# Patient Record
Sex: Male | Born: 1935 | Race: Black or African American | Hispanic: No | Smoking: Former smoker
Health system: Southern US, Community
[De-identification: ages and names within clinical notes are randomized; demographics above are authoritative.]

## PROBLEM LIST (undated history)

## (undated) DIAGNOSIS — N185 Chronic kidney disease, stage 5: Secondary | ICD-10-CM

## (undated) DIAGNOSIS — R590 Localized enlarged lymph nodes: Secondary | ICD-10-CM

## (undated) DIAGNOSIS — E785 Hyperlipidemia, unspecified: Secondary | ICD-10-CM

## (undated) DIAGNOSIS — K5790 Diverticulosis of intestine, part unspecified, without perforation or abscess without bleeding: Secondary | ICD-10-CM

## (undated) DIAGNOSIS — I6529 Occlusion and stenosis of unspecified carotid artery: Secondary | ICD-10-CM

## (undated) DIAGNOSIS — I2510 Atherosclerotic heart disease of native coronary artery without angina pectoris: Secondary | ICD-10-CM

## (undated) DIAGNOSIS — K635 Polyp of colon: Secondary | ICD-10-CM

## (undated) DIAGNOSIS — I2721 Secondary pulmonary arterial hypertension: Secondary | ICD-10-CM

## (undated) DIAGNOSIS — D472 Monoclonal gammopathy: Secondary | ICD-10-CM

## (undated) DIAGNOSIS — I10 Essential (primary) hypertension: Secondary | ICD-10-CM

## (undated) DIAGNOSIS — I5032 Chronic diastolic (congestive) heart failure: Secondary | ICD-10-CM

## (undated) DIAGNOSIS — I441 Atrioventricular block, second degree: Secondary | ICD-10-CM

## (undated) DIAGNOSIS — D509 Iron deficiency anemia, unspecified: Secondary | ICD-10-CM

## (undated) DIAGNOSIS — K219 Gastro-esophageal reflux disease without esophagitis: Secondary | ICD-10-CM

## (undated) DIAGNOSIS — I4892 Unspecified atrial flutter: Secondary | ICD-10-CM

## (undated) DIAGNOSIS — N2581 Secondary hyperparathyroidism of renal origin: Secondary | ICD-10-CM

## (undated) HISTORY — DX: Localized enlarged lymph nodes: R59.0

## (undated) HISTORY — DX: Polyp of colon: K63.5

## (undated) HISTORY — DX: Atherosclerotic heart disease of native coronary artery without angina pectoris: I25.10

## (undated) HISTORY — DX: Occlusion and stenosis of unspecified carotid artery: I65.29

## (undated) HISTORY — DX: Chronic kidney disease, stage 5: N18.5

## (undated) HISTORY — DX: Unspecified atrial flutter: I48.92

## (undated) HISTORY — PX: APPENDECTOMY: SHX54

## (undated) HISTORY — DX: Monoclonal gammopathy: D47.2

## (undated) HISTORY — DX: Hyperlipidemia, unspecified: E78.5

## (undated) HISTORY — DX: Diverticulosis of intestine, part unspecified, without perforation or abscess without bleeding: K57.90

## (undated) HISTORY — DX: Chronic diastolic (congestive) heart failure: I50.32

---

## 1994-11-25 DIAGNOSIS — Z951 Presence of aortocoronary bypass graft: Secondary | ICD-10-CM | POA: Insufficient documentation

## 2002-06-24 ENCOUNTER — Encounter: Payer: Self-pay | Admitting: Emergency Medicine

## 2002-06-24 ENCOUNTER — Emergency Department (HOSPITAL_COMMUNITY): Admission: EM | Admit: 2002-06-24 | Discharge: 2002-06-24 | Payer: Self-pay | Admitting: Emergency Medicine

## 2002-07-02 ENCOUNTER — Emergency Department (HOSPITAL_COMMUNITY): Admission: EM | Admit: 2002-07-02 | Discharge: 2002-07-02 | Payer: Self-pay | Admitting: *Deleted

## 2002-08-26 ENCOUNTER — Encounter: Payer: Self-pay | Admitting: Emergency Medicine

## 2002-08-26 ENCOUNTER — Emergency Department (HOSPITAL_COMMUNITY): Admission: EM | Admit: 2002-08-26 | Discharge: 2002-08-26 | Payer: Self-pay | Admitting: Emergency Medicine

## 2002-11-01 ENCOUNTER — Encounter: Admission: RE | Admit: 2002-11-01 | Discharge: 2002-11-01 | Payer: Self-pay | Admitting: Internal Medicine

## 2002-11-01 ENCOUNTER — Encounter: Payer: Self-pay | Admitting: Internal Medicine

## 2002-12-12 ENCOUNTER — Inpatient Hospital Stay (HOSPITAL_COMMUNITY): Admission: EM | Admit: 2002-12-12 | Discharge: 2002-12-14 | Payer: Self-pay | Admitting: Emergency Medicine

## 2002-12-12 ENCOUNTER — Encounter: Payer: Self-pay | Admitting: Pulmonary Disease

## 2004-09-27 HISTORY — PX: CORONARY ARTERY BYPASS GRAFT: SHX141

## 2004-11-17 ENCOUNTER — Ambulatory Visit: Payer: Self-pay | Admitting: Internal Medicine

## 2004-11-18 ENCOUNTER — Encounter: Admission: RE | Admit: 2004-11-18 | Discharge: 2004-11-18 | Payer: Self-pay | Admitting: Internal Medicine

## 2004-12-23 ENCOUNTER — Ambulatory Visit: Payer: Self-pay | Admitting: Internal Medicine

## 2004-12-23 ENCOUNTER — Inpatient Hospital Stay (HOSPITAL_COMMUNITY): Admission: AD | Admit: 2004-12-23 | Discharge: 2005-01-11 | Payer: Self-pay | Admitting: Internal Medicine

## 2004-12-24 ENCOUNTER — Encounter: Payer: Self-pay | Admitting: Internal Medicine

## 2004-12-25 ENCOUNTER — Encounter (INDEPENDENT_AMBULATORY_CARE_PROVIDER_SITE_OTHER): Payer: Self-pay | Admitting: Specialist

## 2004-12-29 ENCOUNTER — Encounter: Payer: Self-pay | Admitting: Cardiothoracic Surgery

## 2004-12-31 ENCOUNTER — Encounter (INDEPENDENT_AMBULATORY_CARE_PROVIDER_SITE_OTHER): Payer: Self-pay | Admitting: Specialist

## 2005-02-02 ENCOUNTER — Ambulatory Visit: Payer: Self-pay | Admitting: Cardiology

## 2005-02-05 ENCOUNTER — Encounter: Admission: RE | Admit: 2005-02-05 | Discharge: 2005-02-05 | Payer: Self-pay | Admitting: Cardiothoracic Surgery

## 2005-02-12 ENCOUNTER — Ambulatory Visit: Payer: Self-pay | Admitting: Cardiology

## 2005-03-05 ENCOUNTER — Ambulatory Visit: Payer: Self-pay | Admitting: Cardiology

## 2005-04-15 ENCOUNTER — Ambulatory Visit: Payer: Self-pay | Admitting: Cardiology

## 2005-04-20 ENCOUNTER — Ambulatory Visit: Payer: Self-pay | Admitting: Internal Medicine

## 2005-04-21 ENCOUNTER — Ambulatory Visit: Payer: Self-pay | Admitting: Internal Medicine

## 2005-04-27 ENCOUNTER — Ambulatory Visit: Payer: Self-pay | Admitting: Internal Medicine

## 2005-09-09 ENCOUNTER — Ambulatory Visit: Payer: Self-pay | Admitting: Internal Medicine

## 2005-10-19 ENCOUNTER — Ambulatory Visit: Payer: Self-pay

## 2005-12-06 ENCOUNTER — Ambulatory Visit: Payer: Self-pay | Admitting: Cardiology

## 2005-12-17 ENCOUNTER — Ambulatory Visit: Payer: Self-pay | Admitting: Cardiology

## 2005-12-17 ENCOUNTER — Ambulatory Visit: Payer: Self-pay

## 2006-05-05 ENCOUNTER — Ambulatory Visit: Payer: Self-pay | Admitting: Cardiology

## 2006-05-13 ENCOUNTER — Ambulatory Visit: Payer: Self-pay | Admitting: Internal Medicine

## 2006-05-23 ENCOUNTER — Ambulatory Visit: Payer: Self-pay | Admitting: Cardiology

## 2006-05-27 ENCOUNTER — Ambulatory Visit: Payer: Self-pay | Admitting: Internal Medicine

## 2006-06-06 ENCOUNTER — Ambulatory Visit: Payer: Self-pay | Admitting: *Deleted

## 2006-08-16 ENCOUNTER — Ambulatory Visit: Payer: Self-pay | Admitting: Internal Medicine

## 2006-08-23 ENCOUNTER — Ambulatory Visit: Payer: Self-pay | Admitting: Cardiology

## 2006-08-24 ENCOUNTER — Ambulatory Visit: Payer: Self-pay | Admitting: Internal Medicine

## 2006-08-25 ENCOUNTER — Ambulatory Visit: Payer: Self-pay | Admitting: Internal Medicine

## 2006-09-05 ENCOUNTER — Ambulatory Visit: Payer: Self-pay

## 2006-09-05 ENCOUNTER — Encounter: Payer: Self-pay | Admitting: Cardiology

## 2006-09-05 ENCOUNTER — Ambulatory Visit: Payer: Self-pay | Admitting: Cardiology

## 2006-10-18 ENCOUNTER — Ambulatory Visit: Payer: Self-pay | Admitting: *Deleted

## 2006-10-21 ENCOUNTER — Encounter: Payer: Self-pay | Admitting: Emergency Medicine

## 2006-10-21 ENCOUNTER — Inpatient Hospital Stay (HOSPITAL_COMMUNITY): Admission: EM | Admit: 2006-10-21 | Discharge: 2006-10-26 | Payer: Self-pay | Admitting: Cardiology

## 2006-10-21 ENCOUNTER — Ambulatory Visit: Payer: Self-pay | Admitting: Cardiology

## 2006-10-31 ENCOUNTER — Ambulatory Visit: Payer: Self-pay

## 2006-10-31 LAB — CONVERTED CEMR LAB
BUN: 24 mg/dL — ABNORMAL HIGH (ref 6–23)
Creatinine, Ser: 1.3 mg/dL (ref 0.4–1.5)
GFR calc Af Amer: 70 mL/min
GFR calc non Af Amer: 58 mL/min
Glucose, Bld: 192 mg/dL — ABNORMAL HIGH (ref 70–99)

## 2006-11-04 ENCOUNTER — Ambulatory Visit: Payer: Self-pay | Admitting: Cardiology

## 2006-11-08 ENCOUNTER — Ambulatory Visit: Payer: Self-pay | Admitting: Internal Medicine

## 2006-11-08 LAB — CONVERTED CEMR LAB
Hgb A1c MFr Bld: 8.1 % — ABNORMAL HIGH (ref 4.6–6.0)
Iron: 46 ug/dL (ref 42–165)
Saturation Ratios: 15 % — ABNORMAL LOW (ref 20.0–50.0)

## 2006-11-15 ENCOUNTER — Ambulatory Visit: Payer: Self-pay | Admitting: Internal Medicine

## 2006-11-17 ENCOUNTER — Ambulatory Visit: Payer: Self-pay

## 2006-11-17 LAB — CONVERTED CEMR LAB
ALT: 102 units/L — ABNORMAL HIGH (ref 0–40)
AST: 67 units/L — ABNORMAL HIGH (ref 0–37)
Albumin: 3.5 g/dL (ref 3.5–5.2)
Alkaline Phosphatase: 130 units/L — ABNORMAL HIGH (ref 39–117)
Cholesterol: 74 mg/dL (ref 0–200)
HDL: 32.3 mg/dL — ABNORMAL LOW (ref >39.0)
LDL Cholesterol: 33 mg/dL (ref 0–99)
Triglycerides: 44 mg/dL (ref 0–149)
VLDL: 9 mg/dL (ref 0–40)

## 2007-05-13 ENCOUNTER — Encounter: Payer: Self-pay | Admitting: *Deleted

## 2007-05-13 DIAGNOSIS — I10 Essential (primary) hypertension: Secondary | ICD-10-CM | POA: Insufficient documentation

## 2007-06-24 DIAGNOSIS — M1990 Unspecified osteoarthritis, unspecified site: Secondary | ICD-10-CM | POA: Insufficient documentation

## 2007-06-24 DIAGNOSIS — E118 Type 2 diabetes mellitus with unspecified complications: Secondary | ICD-10-CM | POA: Insufficient documentation

## 2007-06-24 DIAGNOSIS — Z8679 Personal history of other diseases of the circulatory system: Secondary | ICD-10-CM | POA: Insufficient documentation

## 2007-06-24 DIAGNOSIS — S98139A Complete traumatic amputation of one unspecified lesser toe, initial encounter: Secondary | ICD-10-CM

## 2007-06-24 DIAGNOSIS — Z9089 Acquired absence of other organs: Secondary | ICD-10-CM

## 2007-06-24 DIAGNOSIS — I2510 Atherosclerotic heart disease of native coronary artery without angina pectoris: Secondary | ICD-10-CM | POA: Insufficient documentation

## 2007-06-24 DIAGNOSIS — E785 Hyperlipidemia, unspecified: Secondary | ICD-10-CM

## 2007-10-23 ENCOUNTER — Ambulatory Visit: Payer: Self-pay | Admitting: Cardiology

## 2007-10-23 ENCOUNTER — Ambulatory Visit: Payer: Self-pay | Admitting: Internal Medicine

## 2007-10-23 ENCOUNTER — Ambulatory Visit: Payer: Self-pay

## 2007-10-23 DIAGNOSIS — R1011 Right upper quadrant pain: Secondary | ICD-10-CM

## 2007-10-23 LAB — CONVERTED CEMR LAB
ALT: 22 units/L (ref 0–53)
AST: 31 units/L (ref 0–37)
Albumin: 3.4 g/dL — ABNORMAL LOW (ref 3.5–5.2)
Alkaline Phosphatase: 96 units/L (ref 39–117)
Calcium: 9.3 mg/dL (ref 8.4–10.5)
Chloride: 102 meq/L (ref 96–112)
Creatinine, Ser: 1.3 mg/dL (ref 0.4–1.5)
GFR calc non Af Amer: 57 mL/min
HDL: 37.1 mg/dL — ABNORMAL LOW (ref >39.0)
LDL Cholesterol: 71 mg/dL (ref 0–99)
Sodium: 137 meq/L (ref 135–145)
Total Bilirubin: 0.6 mg/dL (ref 0.3–1.2)
Total Protein: 6.9 g/dL (ref 6.0–8.3)
VLDL: 14 mg/dL (ref 0–40)

## 2007-10-24 LAB — CONVERTED CEMR LAB
ALT: 22 units/L (ref 0–53)
AST: 31 units/L (ref 0–37)
Alkaline Phosphatase: 96 units/L (ref 39–117)
Amylase: 172 units/L — ABNORMAL HIGH (ref 27–131)
BUN: 14 mg/dL (ref 6–23)
Bilirubin, Direct: 0.1 mg/dL (ref 0.0–0.3)
CO2: 27 meq/L (ref 19–32)
Calcium: 9.3 mg/dL (ref 8.4–10.5)
Creatinine, Ser: 1.3 mg/dL (ref 0.4–1.5)
GFR calc Af Amer: 69 mL/min
Lipase: 46 units/L (ref 11.0–59.0)
Potassium: 3.8 meq/L (ref 3.5–5.1)
Sodium: 137 meq/L (ref 135–145)
Triglycerides: 70 mg/dL (ref 0–149)
VLDL: 14 mg/dL (ref 0–40)

## 2007-10-26 ENCOUNTER — Encounter: Payer: Self-pay | Admitting: Internal Medicine

## 2007-10-27 ENCOUNTER — Telehealth: Payer: Self-pay | Admitting: Internal Medicine

## 2008-01-29 ENCOUNTER — Ambulatory Visit: Payer: Self-pay | Admitting: Internal Medicine

## 2008-01-31 ENCOUNTER — Telehealth: Payer: Self-pay | Admitting: Internal Medicine

## 2008-02-07 ENCOUNTER — Ambulatory Visit: Payer: Self-pay | Admitting: Internal Medicine

## 2008-02-07 DIAGNOSIS — IMO0002 Reserved for concepts with insufficient information to code with codable children: Secondary | ICD-10-CM | POA: Insufficient documentation

## 2008-07-01 ENCOUNTER — Ambulatory Visit: Payer: Self-pay | Admitting: Cardiology

## 2008-07-23 ENCOUNTER — Telehealth: Payer: Self-pay | Admitting: Internal Medicine

## 2008-07-24 ENCOUNTER — Ambulatory Visit: Payer: Self-pay | Admitting: Internal Medicine

## 2008-07-24 DIAGNOSIS — E1149 Type 2 diabetes mellitus with other diabetic neurological complication: Secondary | ICD-10-CM

## 2008-07-24 LAB — CONVERTED CEMR LAB
ALT: 19 units/L (ref 0–53)
Albumin: 3.6 g/dL (ref 3.5–5.2)
Alkaline Phosphatase: 86 units/L (ref 39–117)
Basophils Absolute: 0 10*3/uL (ref 0.0–0.1)
Bilirubin, Direct: 0.1 mg/dL (ref 0.0–0.3)
CO2: 27 meq/L (ref 19–32)
Calcium: 9.4 mg/dL (ref 8.4–10.5)
Chloride: 107 meq/L (ref 96–112)
Cholesterol: 136 mg/dL (ref 0–200)
Creatinine, Ser: 1.4 mg/dL (ref 0.4–1.5)
Eosinophils Relative: 1 % (ref 0.0–5.0)
Glucose, Bld: 108 mg/dL — ABNORMAL HIGH (ref 70–99)
HDL: 40.5 mg/dL (ref >39.0)
Hgb A1c MFr Bld: 7.1 % — ABNORMAL HIGH (ref 4.6–6.0)
LDL Cholesterol: 83 mg/dL (ref 0–99)
MCHC: 33.9 g/dL (ref 30.0–36.0)
Neutro Abs: 3.3 10*3/uL (ref 1.4–7.7)
Platelets: 187 10*3/uL (ref 150–400)
RBC: 3.89 M/uL — ABNORMAL LOW (ref 4.22–5.81)
RDW: 13.9 % (ref 11.5–14.6)
Sodium: 140 meq/L (ref 135–145)
Total Protein: 7.3 g/dL (ref 6.0–8.3)

## 2008-07-25 ENCOUNTER — Encounter: Payer: Self-pay | Admitting: Internal Medicine

## 2008-12-02 ENCOUNTER — Ambulatory Visit: Payer: Self-pay | Admitting: Cardiology

## 2009-01-17 ENCOUNTER — Ambulatory Visit: Payer: Self-pay | Admitting: Cardiology

## 2009-01-17 ENCOUNTER — Ambulatory Visit: Payer: Self-pay

## 2009-01-17 LAB — CONVERTED CEMR LAB
ALT: 18 U/L (ref 0–53)
AST: 26 U/L (ref 0–37)
Albumin: 3.7 g/dL (ref 3.5–5.2)
Alkaline Phosphatase: 100 U/L (ref 39–117)
BUN: 17 mg/dL (ref 6–23)
Bilirubin, Direct: 0.1 mg/dL (ref 0.0–0.3)
CO2: 26 meq/L (ref 19–32)
Calcium: 9.4 mg/dL (ref 8.4–10.5)
Chloride: 111 meq/L (ref 96–112)
Cholesterol: 108 mg/dL (ref 0–200)
Creatinine, Ser: 1.7 mg/dL — ABNORMAL HIGH (ref 0.4–1.5)
GFR calc non Af Amer: 50.62 mL/min (ref >60)
Glucose, Bld: 159 mg/dL — ABNORMAL HIGH (ref 70–99)
HDL: 37.8 mg/dL — ABNORMAL LOW (ref >39.00)
LDL Cholesterol: 60 mg/dL (ref 0–99)
Potassium: 4.1 meq/L (ref 3.5–5.1)
Sodium: 141 meq/L (ref 135–145)
Total Bilirubin: 0.5 mg/dL (ref 0.3–1.2)
Total CHOL/HDL Ratio: 3
Total Protein: 7.5 g/dL (ref 6.0–8.3)
Triglycerides: 52 mg/dL (ref 0.0–149.0)
VLDL: 10.4 mg/dL (ref 0.0–40.0)

## 2009-01-23 ENCOUNTER — Ambulatory Visit: Payer: Self-pay | Admitting: Internal Medicine

## 2009-01-23 ENCOUNTER — Telehealth: Payer: Self-pay | Admitting: Cardiology

## 2009-01-23 DIAGNOSIS — R519 Headache, unspecified: Secondary | ICD-10-CM | POA: Insufficient documentation

## 2009-01-23 DIAGNOSIS — R51 Headache: Secondary | ICD-10-CM

## 2009-08-25 ENCOUNTER — Ambulatory Visit: Payer: Self-pay | Admitting: Cardiology

## 2010-01-22 ENCOUNTER — Encounter: Payer: Self-pay | Admitting: Cardiology

## 2010-01-23 ENCOUNTER — Ambulatory Visit: Payer: Self-pay

## 2010-01-23 ENCOUNTER — Encounter: Payer: Self-pay | Admitting: Cardiology

## 2010-03-25 ENCOUNTER — Encounter (INDEPENDENT_AMBULATORY_CARE_PROVIDER_SITE_OTHER): Payer: Self-pay | Admitting: *Deleted

## 2010-04-23 ENCOUNTER — Ambulatory Visit: Payer: Self-pay | Admitting: Cardiology

## 2010-04-28 ENCOUNTER — Telehealth (INDEPENDENT_AMBULATORY_CARE_PROVIDER_SITE_OTHER): Payer: Self-pay | Admitting: *Deleted

## 2010-04-29 ENCOUNTER — Ambulatory Visit: Payer: Self-pay

## 2010-04-29 ENCOUNTER — Encounter (HOSPITAL_COMMUNITY): Admission: RE | Admit: 2010-04-29 | Discharge: 2010-06-22 | Payer: Self-pay | Admitting: Cardiology

## 2010-04-29 ENCOUNTER — Encounter: Payer: Self-pay | Admitting: Cardiovascular Disease

## 2010-04-29 ENCOUNTER — Ambulatory Visit: Payer: Self-pay | Admitting: Cardiovascular Disease

## 2010-07-28 ENCOUNTER — Telehealth: Payer: Self-pay | Admitting: Internal Medicine

## 2010-08-10 ENCOUNTER — Ambulatory Visit: Payer: Self-pay | Admitting: Internal Medicine

## 2010-08-14 ENCOUNTER — Ambulatory Visit: Payer: Self-pay | Admitting: Internal Medicine

## 2010-08-18 ENCOUNTER — Ambulatory Visit: Payer: Self-pay | Admitting: Internal Medicine

## 2010-08-24 ENCOUNTER — Ambulatory Visit: Payer: Self-pay | Admitting: Internal Medicine

## 2010-08-26 ENCOUNTER — Telehealth: Payer: Self-pay | Admitting: Internal Medicine

## 2010-08-28 ENCOUNTER — Encounter: Payer: Self-pay | Admitting: Internal Medicine

## 2010-09-07 ENCOUNTER — Ambulatory Visit: Payer: Self-pay | Admitting: Internal Medicine

## 2010-09-22 ENCOUNTER — Ambulatory Visit: Payer: Self-pay | Admitting: Internal Medicine

## 2010-10-09 ENCOUNTER — Encounter: Payer: Self-pay | Admitting: Internal Medicine

## 2010-10-18 ENCOUNTER — Encounter: Payer: Self-pay | Admitting: Cardiology

## 2010-10-22 ENCOUNTER — Ambulatory Visit
Admission: RE | Admit: 2010-10-22 | Discharge: 2010-10-22 | Payer: Self-pay | Source: Home / Self Care | Attending: Internal Medicine | Admitting: Internal Medicine

## 2010-10-22 ENCOUNTER — Other Ambulatory Visit: Payer: Self-pay | Admitting: Internal Medicine

## 2010-10-22 DIAGNOSIS — I2589 Other forms of chronic ischemic heart disease: Secondary | ICD-10-CM | POA: Insufficient documentation

## 2010-10-22 LAB — HEPATIC FUNCTION PANEL
ALT: 47 U/L (ref 0–53)
AST: 47 U/L — ABNORMAL HIGH (ref 0–37)
Alkaline Phosphatase: 100 U/L (ref 39–117)
Bilirubin, Direct: 0.1 mg/dL (ref 0.0–0.3)

## 2010-10-22 LAB — CONVERTED CEMR LAB: Blood Glucose, Fingerstick: 237

## 2010-10-22 LAB — HEMOGLOBIN A1C: Hgb A1c MFr Bld: 10 % — ABNORMAL HIGH (ref 4.6–6.5)

## 2010-10-22 LAB — LIPID PANEL
Cholesterol: 88 mg/dL (ref 0–200)
VLDL: 9.6 mg/dL (ref 0.0–40.0)

## 2010-10-23 ENCOUNTER — Encounter (INDEPENDENT_AMBULATORY_CARE_PROVIDER_SITE_OTHER): Payer: Self-pay | Admitting: *Deleted

## 2010-10-23 ENCOUNTER — Telehealth: Payer: Self-pay | Admitting: Internal Medicine

## 2010-10-25 LAB — CONVERTED CEMR LAB
ALT: 28 units/L (ref 0–53)
AST: 27 units/L (ref 0–37)
Albumin: 3.7 g/dL (ref 3.5–5.2)
Alkaline Phosphatase: 118 units/L — ABNORMAL HIGH (ref 39–117)
CO2: 25 meq/L (ref 19–32)
Calcium: 8.8 mg/dL (ref 8.4–10.5)
Creatinine, Ser: 2 mg/dL — ABNORMAL HIGH (ref 0.4–1.5)
GFR calc non Af Amer: 42.81 mL/min (ref >60)
Total Bilirubin: 0.6 mg/dL (ref 0.3–1.2)
Total CHOL/HDL Ratio: 3
Triglycerides: 81 mg/dL (ref 0.0–149.0)

## 2010-10-27 NOTE — Letter (Signed)
Summary: Appointment - Missed  [facility]     [place], [place]    Phone:   Fax:      March 25, 2010 MRN: [id number]   [facility] [address] [place], [place]  [address]   Dear [name],  Our records indicate you missed your appointment on Juen 27,2011  with  Dr. [name] It is very important that we reach you to reschedule this appointment. We look forward to participating in your health care needs. Please contact us at the number listed above at your earliest convenience to reschedule this appointment.     Sincerely,     Lorne Skeens  [facility] Scheduling Team

## 2010-10-27 NOTE — Progress Notes (Signed)
  Phone Note Other Incoming   Caller: Dr Burgess Estelle (opthamologist) Summary of Call: 1.received a call from Dr Burgess Estelle, He states the pt has active Diabetic Retinopathy. Pt also has cateracts and if is impairing his vision. Dr Burgess Estelle states that the pt need to have surg on his cateracts but the problem is that the pt's diabetes is not under control. Pt hasn't taken his metformin in about 5 months because he was taken off of it by Dr Leo Grosser back in July. I am going to call pt to set up appt to come in the office here to see you. Do you want pt to have labs prior to his visit. Please Advise. 2. After Pt comes in for OV Dr Burgess Estelle wants his office notes faxed to his office at [phone] Initial call taken by: Ami Bullins CMA,  July 28, 2010 1:58 PM  Follow-up for Phone Call        no need for lab if he hasn't taken meds for 5 months. Follow-up by: Jacques Navy MD,  July 28, 2010 2:49 PM  Additional Follow-up for Phone Call Additional follow up Details #1::        [facility] Additional Follow-up by: Ami Bullins CMA,  July 28, 2010 3:32 PM    Additional Follow-up for Phone Call Additional follow up Details #2::    spoke with pt and made appt with Dr Debby Bud for Monday Nov 14,2011 Follow-up by: Ami Bullins CMA,  July 29, 2010 1:55 PM

## 2010-10-27 NOTE — Progress Notes (Signed)
Summary: Nuclear Pre-Procedure  Phone Note Outgoing Call   Call placed by: Milana Na, EMT-P,  April 28, 2010 3:16 PM Summary of Call: Left message with information on Myoview Information Sheet (see scanned document for details).      Nuclear Med Background Indications for Stress Test: Evaluation for Ischemia   History: Abnormal EKG, CABG, Echo, Heart Catheterization, Myocardial Perfusion Study  History Comments: '06 Heart Cath EF 45-50% ---CABG x3 '07 ECHO EF 60% '08 MPS EF 46% prior inferior/apical scar (-) ischemia  Symptoms: Chest Pain    Nuclear Pre-Procedure Cardiac Risk Factors: Carotid Disease, Family History - CAD, History of Smoking, Hypertension, Lipids, NIDDM Height (in): 71  Nuclear Med Study Referring MD:  B.Crenshaw

## 2010-10-27 NOTE — Assessment & Plan Note (Signed)
Summary: Cardiology Nuclear Testing  Nuclear Med Background Indications for Stress Test: Evaluation for Ischemia   History: Abnormal EKG, CABG, Echo, Heart Catheterization, Myocardial Perfusion Study  History Comments: '06 Heart Cath EF 45-50% ---CABG x3 '07 ECHO EF 60% '08 MPS EF 46% prior inferior/apical scar (-) ischemia  Symptoms: Chest Pain    Nuclear Pre-Procedure Cardiac Risk Factors: Carotid Disease, Family History - CAD, History of Smoking, Hypertension, Lipids, NIDDM Caffeine/Decaff Intake: None NPO After: 10:30 PM Lungs: clear IV 0.9% NS with Angio Cath: 22g     IV Site: (R) Wrist IV Started by: Irean Hong RN Chest Size (in) 44     Height (in): 71 Weight (lb): 188 BMI: 26.32  Nuclear Med Study 1 or 2 day study:  1 day     Stress Test Type:  Eugenie Birks Reading MD:  Charlton Haws, MD     Referring MD:  B.Crenshaw Resting Radionuclide:  Technetium 78m Tetrofosmin     Resting Radionuclide Dose:  10.6 mCi  Stress Radionuclide:  Technetium 69m Tetrofosmin     Stress Radionuclide Dose:  33.0 mCi   Stress Protocol   Lexiscan: 0.4 mg   Stress Test Technologist:  Milana Na EMT-P     Nuclear Technologist:  Domenic Polite CNMT  Rest Procedure  Myocardial perfusion imaging was performed at rest 45 minutes following the intravenous administration of Myoview Technetium 60m Tetrofosmin.  Stress Procedure  The patient received IV Lexiscan 0.4 mg over 15-seconds.  Myoview injected at 30-seconds.  There were no significant changes with infusion.  Quantitative spect images were obtained after a 45 minute delay.  QPS Raw Data Images:  Normal; no motion artifact; normal heart/lung ratio. Stress Images:  inferior infarct Rest Images:  inferior infarct Subtraction (SDS):  SDS 1 Transient Ischemic Dilatation:  .93  (Normal <1.22)  Lung/Heart Ratio:  .36  (Normal <0.45)  Quantitative Gated Spect Images QGS EDV:  136 ml QGS ESV:  70 ml QGS EF:  49 % QGS cine images:   Apical dyskinesis  Findings Low risk nuclear study  Evidence for inferior infarct  Evidence for LV Dysfunction LV Dysfunction    Overall Impression  Exercise Capacity: Lexiscan BP Response: Normal blood pressure response. 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

## 2010-10-27 NOTE — Miscellaneous (Signed)
Summary: Orders Update  Clinical Lists Changes  Orders: Added new Test order of Carotid Duplex (Carotid Duplex) - Signed 

## 2010-10-27 NOTE — Assessment & Plan Note (Signed)
Summary: f/u appt/#/cd   Vital Signs:  [facility] profile:   77 year old male Height:      71 inches Weight:      194 pounds BMI:     27.16 O2 Sat:      98 % on Room air Temp:     98.8 degrees F oral Pulse rate:   75 / minute BP sitting:   136 / 68  (left arm) Cuff size:   large  Vitals Entered By: Bill Salinas CMA (August 24, 2010 9:25 AM)  O2 Flow:  Room air  Primary Care Provider:  Trejon Duford   History of Present Illness: presents for follow up of diabetes and levemir use  Current Medications (verified): 1)  Cozaar 50 Mg  Tabs (Losartan Potassium) .... Take One Tab By Mouth Once Daily 2)  Furosemide 20 Mg Tabs (Furosemide) .... Take One Tablet By Mouth Daily. 3)  Zetia 10 Mg  Tabs (Ezetimibe) .... Take One Tab By Mouth Mouth Once Daily 4)  Folic Acid 1 Mg  Tabs (Folic Acid) .... Take One Tablet Once Daily 5)  Adult Aspirin Ec Low Strength 81 Mg  Tbec (Aspirin) .... Take 1 Tablet By Mouth Once A Day 6)  Toprol Xl 25 Mg Xr24h-Tab (Metoprolol Succinate) .... One By Mouth Qd 7)  Simvastatin 40 Mg Tabs (Simvastatin) .... Take One Tablet By Mouth Daily At Bedtime 8)  Gabapentin 100 Mg Caps (Gabapentin) .Marland Kitchen.. 1 By Mouth At Bedtime For  Burning Legs 9)  Levemir Flexpen 100 Unit/ml Soln (Insulin Detemir) .... 25 Units At Bedtime 10)  Dexilant 60 Mg Cpdr (Dexlansoprazole) .Marland Kitchen.. 1 By Mouth Qam  Allergies (verified): 1)  ! * Bee Stings   Impression & Recommendations:  Problem # 1:  DIABETES MELLITUS (ICD-250.00)  CBGs generally in the 200 range. Today 141. He seems to understand use of pen and meter  Plan - increase levemir to 30units at bedtime. Add'l  pen given          continue to monitor CBG q AM  Rx given          eat 3 meals a day and bedtime snack  His updated medication list for this problem includes:    Cozaar 50 Mg Tabs (Losartan potassium) .Marland Kitchen... Take one tab by mouth once daily    Adult Aspirin Ec Low Strength 81 Mg Tbec (Aspirin) .Marland Kitchen... Take 1 tablet by mouth once a  day    Levemir Flexpen 100 Unit/ml Soln (Insulin detemir) .Marland Kitchen... 25 units at bedtime  Orders: No Charge [facility] Arrived (NCPA0) (NCPA0)  Problem # 2:  DIABETIC PERIPHERAL NEUROPATHY (ICD-250.60) gabapentin is helping  Plan - congtinue med  His updated medication list for this problem includes:    Cozaar 50 Mg Tabs (Losartan potassium) .Marland Kitchen... Take one tab by mouth once daily    Adult Aspirin Ec Low Strength 81 Mg Tbec (Aspirin) .Marland Kitchen... Take 1 tablet by mouth once a day    Levemir Flexpen 100 Unit/ml Soln (Insulin detemir) .Marland Kitchen... 25 units at bedtime  Problem # 3:  ABDOMINAL PAIN, RIGHT UPPER QUADRANT (ICD-789.01) Parker never started dexilant  Plan - trial of dexilant.   Complete Medication List: 1)  Cozaar 50 Mg Tabs (Losartan potassium) .... Take one tab by mouth once daily 2)  Furosemide 20 Mg Tabs (Furosemide) .... Take one tablet by mouth daily. 3)  Zetia 10 Mg Tabs (Ezetimibe) .... Take one tab by mouth mouth once daily 4)  Folic Acid 1 Mg Tabs (Folic  acid) .... Take one tablet once daily 5)  Adult Aspirin Ec Low Strength 81 Mg Tbec (Aspirin) .... Take 1 tablet by mouth once a day 6)  Toprol Xl 25 Mg Xr24h-tab (Metoprolol succinate) .... One by mouth qd 7)  Simvastatin 40 Mg Tabs (Simvastatin) .... Take one tablet by mouth daily at bedtime 8)  Gabapentin 100 Mg Caps (Gabapentin) .Marland Kitchen.. 1 by mouth at bedtime for  burning legs 9)  Levemir Flexpen 100 Unit/ml Soln (Insulin detemir) .... 25 units at bedtime 10)  Dexilant 60 Mg Cpdr (Dexlansoprazole) .Marland Kitchen.. 1 by mouth qam   Orders Added: 1)  No Charge [facility] Arrived (NCPA0) [NCPA0]    Laboratory Results   Blood Tests    Date/Time Reported: Ami Bullins CMA  August 24, 2010 9:26 AM   CBG Fasting:: 141mg /dL

## 2010-10-27 NOTE — Assessment & Plan Note (Signed)
Summary: PER CHECK OUT   Visit Type:  Follow-up Primary Provider:  Norins  CC:  chest pain.  History of Present Illness: [name] is a gentleman who has a history of coronary artery disease, status post coronary artery bypass graft in 2006.  His most recent Myoview was performed on November 17, 2006.  At that time, he had an ejection fraction of 46% and there was prior inferior and apical infarct, but no ischemia.  He also has cerebrovascular disease. Last carotid Dopplers were performed in April of 2011 and showed a 40-59% bilateral stenosis. Followup was recommended in one year. I last saw him in November of 2010. Since then he denies any dyspnea on exertion, orthopnea, PND, pedal edema, palpitations, syncope or exertional chest pain. He has some pain on the right side of his chest that is chronic and unchanged.  Current Medications (verified): 1)  Cozaar 50 Mg  Tabs (Losartan Potassium) .... Take One Tab By Mouth Once Daily 2)  Lasix 40 Mg  Tabs (Furosemide) .... Take One Tab By Mouth Once Daily 3)  Zetia 10 Mg  Tabs (Ezetimibe) .... Take One Tab By Mouth Mouth Once Daily 4)  Folic Acid 1 Mg  Tabs (Folic Acid) .... Take One Tablet Once Daily 5)  Adult Aspirin Ec Low Strength 81 Mg  Tbec (Aspirin) .... Take 1 Tablet By Mouth Once A Day 6)  Toprol Xl 25 Mg Xr24h-Tab (Metoprolol Succinate) .... One By Mouth Qd 7)  Simvastatin 40 Mg Tabs (Simvastatin) .... Take One Tablet By Mouth Daily At Bedtime  Allergies (verified): 1)  ! * Bee Stings  Past History:  Past Medical History: Reviewed history from 08/22/2009 and no changes required. CEREBROVASCULAR DISEASE, HX OF (ICD-V12.50) * Hx of MEDIASTINAL SUBCARINAL MASS CORONARY ARTERY DISEASE (ICD-414.00) OSTEOARTHRITIS (ICD-715.90) HYPERLIPIDEMIA (ICD-272.4) DIABETES MELLITUS (ICD-250.00) HYPERTENSION (ICD-401.9) History of congestive heart failure secondary to diastolic dysfunction  Past Surgical History: Reviewed history from  05/13/2007 and no changes required. APPENDECTOMY, HX OF (ICD-V45.79) * REATTACHMENT GREAT TOE STATUS, OTHER TOE(S) AMPUTATION (ICD-V49.72) CORONARY ARTERY BYPASS GRAFT, HX OF (ICD-V45.81)  Social History: Reviewed history from 08/25/2009 and no changes required. 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 Tobacco Use - Former.   Review of Systems       Complains of burning in his feet while urinating but no fevers or chills, productive cough, hemoptysis, dysphasia, odynophagia, melena, hematochezia, dysuria, hematuria, rash, seizure activity, orthopnea, PND, pedal edema, claudication. Remaining systems are negative.   Vital Signs:  Patient profile:   77 year old male Height:      71 inches Weight:      189 pounds Pulse rate:   73 / minute BP sitting:   168 / 80  (left arm) Cuff size:   large  Vitals Entered By: Burnett Kanaris, CNA (April 23, 2010 8:32 AM)  Physical Exam  General:  Well-developed well-nourished in no acute distress.  Skin is warm and dry.  HEENT is normal.  Neck is supple. No thyromegaly.  Chest is clear to auscultation with normal expansion.  Cardiovascular exam is regular rate and rhythm.  Abdominal exam nontender or distended. No masses palpated. Extremities show no edema. neuro grossly intact    EKG  Procedure date:  04/23/2010  Findings:      Normal sinus rhythm at a rate of 73. First degree  AV block. Left ventricular hypertrophy with QRS widening and repolarization abnormality.  Impression & Recommendations:  Problem # 1:  CORONARY ARTERY DISEASE (ICD-414.00)  Continue aspirin, beta blocker and statin. Schedule Myoview for risk stratification. His updated medication list for this problem includes:    Adult Aspirin Ec Low Strength 81 Mg Tbec (Aspirin) .Marland Kitchen... Take 1  tablet by mouth once a day    Toprol Xl 25 Mg Xr24h-tab (Metoprolol succinate) ..... One by mouth qd  Orders: EKG w/ Interpretation (93000) TLB-BMP (Basic Metabolic Panel-BMET) (80048-METABOL) TLB-Lipid Panel (80061-LIPID) TLB-Hepatic/Liver Function Pnl (80076-HEPATIC) Nuclear Stress Test (Nuc Stress Test)  Orders: EKG w/ Interpretation (93000) TLB-BMP (Basic Metabolic Panel-BMET) (80048-METABOL) TLB-Lipid Panel (80061-LIPID) TLB-Hepatic/Liver Function Pnl (80076-HEPATIC) Nuclear Stress Test (Nuc Stress Test)  Problem # 2:  HYPERTENSION (ICD-401.9)  Blood pressure elevated but he has not taken his medications this morning. We will follow this and increase as needed. His updated medication list for this problem includes:    Cozaar 50 Mg Tabs (Losartan potassium) .Marland Kitchen... Take one tab by mouth once daily    Lasix 40 Mg Tabs (Furosemide) .Marland Kitchen... Take one tab by mouth once daily    Adult Aspirin Ec Low Strength 81 Mg Tbec (Aspirin) .Marland Kitchen... Take 1 tablet by mouth once a day    Toprol Xl 25 Mg Xr24h-tab (Metoprolol succinate) ..... One by mouth qd  Orders: EKG w/ Interpretation (93000) TLB-BMP (Basic Metabolic Panel-BMET) (80048-METABOL) TLB-Lipid Panel (80061-LIPID) TLB-Hepatic/Liver Function Pnl (80076-HEPATIC) Nuclear Stress Test (Nuc Stress Test)  Problem # 3:  CEREBROVASCULAR DISEASE, HX OF (ICD-V12.50) Continue aspirin and statin. Followup carotid Dopplers April 2012.  Problem # 4:  HYPERLIPIDEMIA (ICD-272.4)  Continue statin. Check lipids and liver. His updated medication list for this problem includes:    Zetia 10 Mg Tabs (Ezetimibe) .Marland Kitchen... Take one tab by mouth mouth once daily    Simvastatin 40 Mg Tabs (Simvastatin) .Marland Kitchen... Take one tablet by mouth daily at bedtime  Orders: EKG w/ Interpretation (93000) TLB-BMP (Basic Metabolic Panel-BMET) (80048-METABOL) TLB-Lipid Panel (80061-LIPID) TLB-Hepatic/Liver Function Pnl (80076-HEPATIC) Nuclear Stress Test (Nuc Stress  Test)  Problem # 5:  DIABETES MELLITUS (ICD-250.00)  The following medications were removed from the medication list:    Metformin Hcl 1000 Mg Tabs (Metformin hcl) .Marland Kitchen... Take one tablet twice daily His updated medication list for this problem includes:    Cozaar 50 Mg Tabs (Losartan potassium) .Marland Kitchen... Take one tab by mouth once daily    Adult Aspirin Ec Low Strength 81 Mg Tbec (Aspirin) .Marland Kitchen... Take 1 tablet by mouth once a day  Patient Instructions: 1)  Your physician recommends that you have lab work today: bmet/lipid/liver (414.01;272.0;401.1) 2)  Your physician wants you to follow-up in: 1 year.  You will receive a reminder letter in the mail two months in advance. If you don't receive a letter, please call our office to schedule the follow-up appointment. 3)  Your physician has requested that you have a Lexiscan myoview.  For further information please visit [email or web address]  Please follow instruction sheet, as given. 4)  Your physician recommends that you continue on your current medications as directed. Please refer to the Current Medication list given to you today. Prescriptions: ZETIA 10 MG  TABS (EZETIMIBE) take one tab by mouth mouth once daily  #30 Tablet x 11   Entered by:   Sherri Rad, RN, BSN   Authorized by:   Ferman Hamming, MD, [facility]   Signed by:   Sherri Rad, RN, BSN on 04/23/2010  Method used:   Electronically to        [facility] (retail)       [address]       [place], [place]  [id number]       Ph: [phone]       Fax: [phone]   RxID:   [id number]

## 2010-10-27 NOTE — Letter (Signed)
Summary: Letter for Surgery Clearance / [facility]  Letter for Surgery Clearance / [facility]   Imported By: Lennie Odor 09/01/2010 10:29:41  _____________________________________________________________________  External Attachment:    Type:   Image     Comment:   External Document

## 2010-10-27 NOTE — Assessment & Plan Note (Signed)
Summary: PER PT THIS FRI APPT  STC   Vital Signs:  Patient profile:   77 year old male Height:      71 inches Weight:      194 pounds BMI:     27.16 O2 Sat:      98 % on Room air Temp:     97.5 degrees F oral Pulse rate:   87 / minute BP sitting:   152 / 80  (left arm) Cuff size:   large  Vitals Entered By: Bill Salinas CMA (August 14, 2010 3:23 PM)  O2 Flow:  Room air CC: follow up on diabetes/ ab   Primary Care Provider:  Norins  CC:  follow up on diabetes/ ab.  History of Present Illness: Patient was supposed to return today with CBG readings after starting levemir. 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 He was not given levemir sample.  Provided lancets today and reinstruction on use of meter. Provided levemir sample and reinstructed.  His CBG today was 398!  He will return Tuesday 11/22  Current Medications (verified): 1)  Cozaar 50 Mg  Tabs (Losartan Potassium) .... Take One Tab By Mouth Once Daily 2)  Furosemide 20 Mg Tabs (Furosemide) .... Take One Tablet By Mouth Daily. 3)  Zetia 10 Mg  Tabs (Ezetimibe) .... Take One Tab By Mouth Mouth Once Daily 4)  Folic Acid 1 Mg  Tabs (Folic Acid) .... Take One Tablet Once Daily 5)  Adult Aspirin Ec Low Strength 81 Mg  Tbec (Aspirin) .... Take 1 Tablet By Mouth Once A Day 6)  Toprol Xl 25 Mg Xr24h-Tab (Metoprolol Succinate) .... One By Mouth Qd 7)  Simvastatin 40 Mg Tabs (Simvastatin) .... Take One Tablet By Mouth Daily At Bedtime 8)  Gabapentin 100 Mg Caps (Gabapentin) .Marland Kitchen.. 1 By Mouth At Bedtime For  Burning Legs 9)  Levemir Flexpen 100 Unit/ml Soln (Insulin Detemir) .... 25 Units At Bedtime 10)  Dexilant 60 Mg Cpdr (Dexlansoprazole) .Marland Kitchen.. 1 By Mouth Qam  Allergies (verified): 1)  ! * Bee Stings   Complete Medication List: 1)  Cozaar 50 Mg Tabs (Losartan potassium) .... Take one tab by mouth once daily 2)  Furosemide 20 Mg Tabs (Furosemide) .... Take  one tablet by mouth daily. 3)  Zetia 10 Mg Tabs (Ezetimibe) .... Take one tab by mouth mouth once daily 4)  Folic Acid 1 Mg Tabs (Folic acid) .... Take one tablet once daily 5)  Adult Aspirin Ec Low Strength 81 Mg Tbec (Aspirin) .... Take 1 tablet by mouth once a day 6)  Toprol Xl 25 Mg Xr24h-tab (Metoprolol succinate) .... One by mouth qd 7)  Simvastatin 40 Mg Tabs (Simvastatin) .... Take one tablet by mouth daily at bedtime 8)  Gabapentin 100 Mg Caps (Gabapentin) .Marland Kitchen.. 1 by mouth at bedtime for  burning legs 9)  Levemir Flexpen 100 Unit/ml Soln (Insulin detemir) .... 25 units at bedtime 10)  Dexilant 60 Mg Cpdr (Dexlansoprazole) .Marland Kitchen.. 1 by mouth qam  Other Orders: No Charge Patient Arrived (NCPA0) (NCPA0)   Orders Added: 1)  No Charge Patient Arrived (NCPA0) [NCPA0]

## 2010-10-27 NOTE — Assessment & Plan Note (Signed)
Summary: follow up on diabetes-lb   Vital Signs:  Patient profile:   77 year old male Height:      71 inches Weight:      192 pounds BMI:     26.88 O2 Sat:      97 % on Room air Temp:     97.5 degrees F oral Pulse rate:   76 / minute BP sitting:   144 / 80  (left arm) Cuff size:   large  Vitals Entered By: Bill Salinas CMA (August 18, 2010 11:02 AM)  O2 Flow:  Room air CC: follow-up visit on diabetes/ ab   Primary Care Provider:  Norins  CC:  follow-up visit on diabetes/ ab.  History of Present Illness: [name] returns for follow-up of diabetes. He has been using levemir daily for the past three days - but he reports using 4 units instead of 25 units!! His CBG in AM have remained greater than 200.  He is reinstructed in the use of lancet and glucometer as well as the use of levemir pen with instructions to take 25 units at bedtime.  He will return 11/28 for recheck.   Current Medications (verified): 1)  Cozaar 50 Mg  Tabs (Losartan Potassium) .... Take One Tab By Mouth Once Daily 2)  Furosemide 20 Mg Tabs (Furosemide) .... Take One Tablet By Mouth Daily. 3)  Zetia 10 Mg  Tabs (Ezetimibe) .... Take One Tab By Mouth Mouth Once Daily 4)  Folic Acid 1 Mg  Tabs (Folic Acid) .... Take One Tablet Once Daily 5)  Adult Aspirin Ec Low Strength 81 Mg  Tbec (Aspirin) .... Take 1 Tablet By Mouth Once A Day 6)  Toprol Xl 25 Mg Xr24h-Tab (Metoprolol Succinate) .... One By Mouth Qd 7)  Simvastatin 40 Mg Tabs (Simvastatin) .... Take One Tablet By Mouth Daily At Bedtime 8)  Gabapentin 100 Mg Caps (Gabapentin) .Marland Kitchen.. 1 By Mouth At Bedtime For  Burning Legs 9)  Levemir Flexpen 100 Unit/ml Soln (Insulin Detemir) .... 25 Units At Bedtime 10)  Dexilant 60 Mg Cpdr (Dexlansoprazole) .Marland Kitchen.. 1 By Mouth Qam  Allergies (verified): 1)  ! * Bee Stings   Impression & Recommendations:  Problem # 1:  DIABETES MELLITUS (ICD-250.00) See HPI. He is starting to use levemir. Will return in several days  for recheck.   His updated medication list for this problem includes:    Cozaar 50 Mg Tabs (Losartan potassium) .Marland Kitchen... Take one tab by mouth once daily    Adult Aspirin Ec Low Strength 81 Mg Tbec (Aspirin) .Marland Kitchen... Take 1 tablet by mouth once a day    Levemir Flexpen 100 Unit/ml Soln (Insulin detemir) .Marland Kitchen... 25 units at bedtime  Complete Medication List: 1)  Cozaar 50 Mg Tabs (Losartan potassium) .... Take one tab by mouth once daily 2)  Furosemide 20 Mg Tabs (Furosemide) .... Take one tablet by mouth daily. 3)  Zetia 10 Mg Tabs (Ezetimibe) .... Take one tab by mouth mouth once daily 4)  Folic Acid 1 Mg Tabs (Folic acid) .... Take one tablet once daily 5)  Adult Aspirin Ec Low Strength 81 Mg Tbec (Aspirin) .... Take 1 tablet by mouth once a day 6)  Toprol Xl 25 Mg Xr24h-tab (Metoprolol succinate) .... One by mouth qd 7)  Simvastatin 40 Mg Tabs (Simvastatin) .... Take one tablet by mouth daily at bedtime 8)  Gabapentin 100 Mg Caps (Gabapentin) .Marland Kitchen.. 1 by mouth at bedtime for  burning legs 9)  Levemir Flexpen 100  Unit/ml Soln (Insulin detemir) .... 25 units at bedtime 10)  Dexilant 60 Mg Cpdr (Dexlansoprazole) .Marland Kitchen.. 1 by mouth qam  Patient Instructions: 1)  DIABETES MANAGEMENT - PLEASE TAKE 25 UNITS OF LEVEMIR EVERY NIGHT AT BEDTIME. CONTINUE TO CHECK YOUR BLOOD SUGAR BEFORE BREAKFAST EVERY DAY.  Each pen contains 300 units of insulin, so the pen should last at least 10 days. Come by the office Monday November 28th so I can review your blood sugars and give you another pen or a discount card.    Orders Added: 1)  Est. Patient Level II [[address]]

## 2010-10-27 NOTE — Assessment & Plan Note (Signed)
Summary: ov to discuss medications and f/u on diabetes   Vital Signs:  Patient profile:   77 year old male Height:      71 inches Weight:      194 pounds BMI:     27.16 O2 Sat:      96 % on Room air Temp:     97.8 degrees F oral Pulse rate:   78 / minute BP sitting:   146 / 78  (left arm) Cuff size:   large  Vitals Entered By: Bill Salinas CMA (August 10, 2010 11:17 AM)  O2 Flow:  Room air CC: pt here to discuss Bromday 0.09 % before starting this medication/ ab   Primary Care Provider:  Norins  CC:  pt here to discuss Bromday 0.09 % before starting this medication/ ab.  History of Present Illness: Patient presents for follow-up. He has not been seen since October '09. In the interval he has been followed by Dr. [name] for cardiology. 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 Lab work to monitor his BP control and lipid management July '11 with excellent LDL @ 38 but elevated creatinine at 2.0 and serum glucose of 515. Patient did call for a refill on metformin in Aug '1 which was filled for one month with intent for office visit to [facility] care. He has been out of medication since Sept '11. He reports no symptoms of either hypo or hyper glycemia and feels he is doing ok with no medication. His CBG done in the office today is 378.  He reports that he is doing OK. He has been working as a yard maintenance person and has not had any significant changes in his condition.  Current Medications (verified): 1)  Cozaar 50 Mg  Tabs (Losartan Potassium) .... Take One Tab By Mouth Once Daily 2)  Furosemide 20 Mg Tabs (Furosemide) .... Take One Tablet By Mouth Daily. 3)  Zetia 10 Mg  Tabs (Ezetimibe) .... Take One Tab By Mouth Mouth Once Daily 4)  Folic Acid 1 Mg  Tabs (Folic Acid) .... Take One Tablet Once Daily 5)  Adult Aspirin Ec Low Strength 81 Mg  Tbec (Aspirin) .... Take 1 Tablet By Mouth Once A Day 6)  Toprol Xl 25 Mg Xr24h-Tab (Metoprolol  Succinate) .... One By Mouth Qd 7)  Simvastatin 40 Mg Tabs (Simvastatin) .... Take One Tablet By Mouth Daily At Bedtime  Allergies: 1)  ! 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-414.00) OSTEOARTHRITIS (ICD-715.90) HYPERLIPIDEMIA (ICD-272.4) DIABETES MELLITUS (ICD-250.00) HYPERTENSION (ICD-401.9) History of congestive heart failure secondary to diastolic dysfunction  Past Surgical History: Last updated: 05/13/2007 APPENDECTOMY, HX OF (ICD-V45.79) * REATTACHMENT GREAT TOE STATUS, OTHER TOE(S) AMPUTATION (ICD-V49.72) CORONARY ARTERY BYPASS GRAFT, HX OF (ICD-V45.81)  Social History: Last updated: 08/25/2009 married '50 - widowed 15yrs; remarried 6's- widowed '96 7 sons, 5 daughters; 30 grandchildren; 15 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 Tobacco Use - Former.   Review of Systems  The patient denies anorexia, fever, weight loss, weight gain, decreased hearing, hoarseness, chest pain, syncope, peripheral edema, prolonged cough, hemoptysis, abdominal pain, severe indigestion/heartburn, muscle weakness, difficulty walking, depression, and enlarged lymph nodes.    Physical Exam  General:  WNWD AA male who looks younger than his stated age or medical  condition would suggest Head:  normocephalic and atraumatic.   Eyes:  pupils equal, pupils round, corneas and lenses clear, and no injection.   Neck:  supple.   Lungs:  normal respiratory effort.   Heart:  normal rate and regular rhythm.   Msk:  no joint deformities.   Pulses:  2+ radial Neurologic:  alert & oriented X3, cranial nerves II-XII intact, strength normal in all extremities, and gait normal.   Skin:  turgor normal and color normal.   Cervical Nodes:  no anterior cervical adenopathy and no  posterior cervical adenopathy.   Psych:  Oriented X3, normally interactive, good eye contact, and not anxious appearing.  Lacks insight into his medical condition and need for routine follow-up for diabetes.    Impression & Recommendations:  Problem # 1:  HYPERTENSION (ICD-401.9)  His updated medication list for this problem includes:    Cozaar 50 Mg Tabs (Losartan potassium) .Marland Kitchen... Take one tab by mouth once daily    Furosemide 20 Mg Tabs (Furosemide) .Marland Kitchen... Take one tablet by mouth daily.    Toprol Xl 25 Mg Xr24h-tab (Metoprolol succinate) ..... One by mouth qd  BP today: 146/78 Prior BP: 168/80 (04/23/2010)  Labs Reviewed: K+: 3.9 (04/23/2010) Creat: : 2.0 (04/23/2010)     Subopitmal control for a diabetic with known CAD.  Plan - return office visit in 4 days and recheck BP. If continued poor control will adjust medications  Problem # 2:  DIABETES MELLITUS (ICD-250.00) Out of control and off medications. He had a serum glucose of 515 in July and today CBG 378. Due to rise in creatinine he is not a candidate for metformin any longer. Due to cardiomyopathy and h/o diastolic dysfunction related CHF he is not a candidate for Actos. DPP4 and GLP1 drugs are too expensive   Plan - will start basal insulin therapy with levemir. He is instructed in the use of the flex pen           he is provided a glucometer and instructed in it's use           he is instructed to check fasting CBGs q AM and record.           he will return Friday, Nov 18th for follow-up and adjustment in levemir dose.  His updated medication list for this problem includes:    Cozaar 50 Mg Tabs (Losartan potassium) .Marland Kitchen... Take one tab by mouth once daily    Adult Aspirin Ec Low Strength 81 Mg Tbec (Aspirin) .Marland Kitchen... Take 1 tablet by mouth once a day    Levemir Flexpen 100 Unit/ml Soln (Insulin detemir) .Marland Kitchen... 25 units at bedtime  Problem # 3:  CORONARY ARTERY DISEASE (ICD-414.00) Stable. He will follow-up with Dr. [name]  as instructed.  His updated medication list for this problem includes:    Cozaar 50 Mg Tabs (Losartan potassium) .Marland Kitchen... Take one tab by mouth once daily    Furosemide 20 Mg Tabs (Furosemide) .Marland Kitchen... Take one tablet by mouth daily.    Adult Aspirin Ec Low Strength 81 Mg Tbec (Aspirin) .Marland Kitchen... Take 1 tablet by mouth once a day    Toprol Xl 25 Mg Xr24h-tab (Metoprolol succinate) ..... One by mouth qd  Problem # 4:  HYPERLIPIDEMIA (ICD-272.4) Excellent control on present medications. Plan - continue the same.  His updated medication list for this problem includes:    Zetia 10 Mg Tabs (Ezetimibe) .Marland Kitchen... Take one tab by mouth mouth once daily    Simvastatin  40 Mg Tabs (Simvastatin) .Marland Kitchen... Take one tablet by mouth daily at bedtime  Problem # 5:  DIABETIC PERIPHERAL NEUROPATHY (ICD-250.60) Patient with continued burnig legs.  Plan - start gabapentin 100mg  at bedtime and advance as needed for comfort  His updated medication list for this problem includes:    Cozaar 50 Mg Tabs (Losartan potassium) .Marland Kitchen... Take one tab by mouth once daily    Adult Aspirin Ec Low Strength 81 Mg Tbec (Aspirin) .Marland Kitchen... Take 1 tablet by mouth once a day    Levemir Flexpen 100 Unit/ml Soln (Insulin detemir) .Marland Kitchen... 25 units at bedtime  Problem # 6:  ABDOMINAL PAIN, RIGHT UPPER QUADRANT (ICD-789.01) Long standing problem. When seen in Oct '09 he was started on nexium with a plan to move to zantac. He did not continue medications and is uncertain as to whether they helped his discomfort. Given the normal NST suspect his pain continues to be reflux/dyspepsia related.  Plan - trial of dexilant 60mg  once daily 5 days of samples provided. If effective will offer generic substitution for PPI.   Complete Medication List: 1)  Cozaar 50 Mg Tabs (Losartan potassium) .... Take one tab by mouth once daily 2)  Furosemide 20 Mg Tabs (Furosemide) .... Take one tablet by mouth daily. 3)  Zetia 10 Mg Tabs (Ezetimibe) .... Take one tab by mouth  mouth once daily 4)  Folic Acid 1 Mg Tabs (Folic acid) .... Take one tablet once daily 5)  Adult Aspirin Ec Low Strength 81 Mg Tbec (Aspirin) .... Take 1 tablet by mouth once a day 6)  Toprol Xl 25 Mg Xr24h-tab (Metoprolol succinate) .... One by mouth qd 7)  Simvastatin 40 Mg Tabs (Simvastatin) .... Take one tablet by mouth daily at bedtime 8)  Gabapentin 100 Mg Caps (Gabapentin) .Marland Kitchen.. 1 by mouth at bedtime for  burning legs 9)  Levemir Flexpen 100 Unit/ml Soln (Insulin detemir) .... 25 units at bedtime 10)  Dexilant 60 Mg Cpdr (Dexlansoprazole) .Marland Kitchen.. 1 by mouth qam  Patient Instructions: 1)  Diabetes - out of control!! Because your kidneys are working at a less than normal level I cannot prescribe metformin. Because of your heart and history of heart disease medication choices are limited. Plan - start Levemir 1 shot at bedtime 25 units. Check your  blood sugar every morning before you eat and write it down. Come back on Friday for follow-up and adjustment in dose if needed. 2)  Burning legs - I think this is due to your  diabetes. Plan - take gabapentin 100mg  at bedtime. 3)  For right upper chest pain take dexilant 60mg  1 pill every morning Prescriptions: GABAPENTIN 100 MG CAPS (GABAPENTIN) 1 by mouth at bedtime for  burning legs  #30 x 3   Entered and Authorized by:   Jacques Navy MD   Signed by:   Jacques Navy MD on 08/10/2010   Method used:   Electronically to        [facility] (retail)       [address]       [place], [place]  [id number]       Ph: [phone]       Fax: [phone]   RxID:   [id number]    Orders Added: 1)  Est. Patient Level IV [24401]

## 2010-10-27 NOTE — Progress Notes (Signed)
Summary: Cataract Surgery Clearance  Phone Note From Other Clinic   Caller: Dr Huel Coventry office phone # [phone] fax# [phone] Summary of Call: Dr Burgess Estelle is req clearance for cataract surgery considering pt advised them he had stopped all diabetic meds. They need note from MD faxed.  Initial call taken by: Lamar Sprinkles, CMA,  August 26, 2010 4:19 PM  Follow-up for Phone Call        Per MD, pt ok to have surgery...Marland KitchenMarland Kitchenlatter faxed to office

## 2010-10-29 NOTE — Medication Information (Signed)
Summary: [facility]  [facility]   Imported By: Sherian Rein 10/15/2010 11:35:10  _____________________________________________________________________  External Attachment:    Type:   Image     Comment:   External Document

## 2010-10-29 NOTE — Progress Notes (Signed)
  Phone Note Outgoing Call   Reason for Call: Discuss lab or test results Summary of Call: Please call patient: 1. A1C is 10%- remains high. Continue levemir. Be careful about diet: no sugar low carb. Start check blood sugar a second time during the day either before a meal or 2 hrs after. Keep this record and bring with you to next OV in 3-4 weeks 2. Mild fluid in the lungs. Plan - increaase furosemide to 40mg  once a day (change med list, new Rx)  Thanks Initial call taken by: Jacques Navy MD,  October 23, 2010 5:27 AM  Follow-up for Phone Call        informed pt and appt sch feb 16 at 1045am Follow-up by: Ami Bullins CMA,  October 23, 2010 8:38 AM    New/Updated Medications: FUROSEMIDE 40 MG TABS (FUROSEMIDE) 1 tablet once a day Prescriptions: FUROSEMIDE 40 MG TABS (FUROSEMIDE) 1 tablet once a day  #30 x 2   Entered by:   Ami Bullins CMA   Authorized by:   Jacques Navy MD   Signed by:   Bill Salinas CMA on 10/23/2010   Method used:   Electronically to        [facility] (retail)       [address]       [place], [place]  [id number]       Ph: [phone]       Fax: [phone]   RxID:   [id number]

## 2010-10-29 NOTE — Letter (Addendum)
Summary: Primary Care Consult Scheduled Letter  [facility]  [address] [place], [place] [address]   Phone: [phone]  Fax: [phone]      10/23/2010 MRN: [id number]  [name] [address] [place], [place]  [address]    Dear Mr. Weatherly,      We have scheduled an appointment for you.  At the recommendation of Dr.Norins, we have scheduled you a consult with [facility](2D-Echo) on Feb 6,2012 at 11:30am. Their address is [address] [place] . The office phone number is [phone]. If this appointment day and time is not convenient for you, please feel free to call the office of the doctor you are being referred to at the number listed above and reschedule the appointment.     It is important for you to keep your scheduled appointments. We are here to make sure you are given good patient care. If you have questions or you have made changes to your appointment, please notify us at  [phone]        , ask for  Debra            .    Thank you,  Patient Care Coordinator [facility]

## 2010-10-29 NOTE — Assessment & Plan Note (Signed)
Summary: one month follow up-lb   Vital Signs:  Patient profile:   77 year old male Height:      71 inches Weight:      207 pounds BMI:     28.97 O2 Sat:      94 % on Room air Temp:     97.3 degrees F oral Pulse rate:   73 / minute BP sitting:   132 / 80  (left arm) Cuff size:   large  Vitals Entered By: Bill Salinas CMA (October 22, 2010 10:29 AM)  O2 Flow:  Room air CC: pt here for fu on diabetes/ ab CBG Result 237   Primary Care Provider:  Norins  CC:  pt here for fu on diabetes/ ab.  History of Present Illness: Mr. Confer presents for follow-up of diabetes. He reports that his blood sugars have been running a little low - reviewed his Glucometer records and he had several readings less than 60. He did subsequently reduce the lantus to 24 units at bedtime.   He reports that he has had PND when lying flat., He will have SOB and wheezing. He has a history of pulmonary effusion.   Current Medications (verified): 1)  Cozaar 50 Mg  Tabs (Losartan Potassium) .... Take One Tab By Mouth Once Daily 2)  Furosemide 20 Mg Tabs (Furosemide) .... Take One Tablet By Mouth Daily. 3)  Zetia 10 Mg  Tabs (Ezetimibe) .... Take One Tab By Mouth Mouth Once Daily 4)  Folic Acid 1 Mg  Tabs (Folic Acid) .... Take One Tablet Once Daily 5)  Adult Aspirin Ec Low Strength 81 Mg  Tbec (Aspirin) .... Take 1 Tablet By Mouth Once A Day 6)  Toprol Xl 25 Mg Xr24h-Tab (Metoprolol Succinate) .... One By Mouth Qd 7)  Simvastatin 40 Mg Tabs (Simvastatin) .... Take One Tablet By Mouth Daily At Bedtime 8)  Gabapentin 100 Mg Caps (Gabapentin) .Marland Kitchen.. 1 By Mouth At Bedtime For  Burning Legs 9)  Levemir Flexpen 100 Unit/ml Soln (Insulin Detemir) .... 30 Units At Bedtime 10)  Dexilant 60 Mg Cpdr (Dexlansoprazole) .Marland Kitchen.. 1 By Mouth Qam 11)  Accu-Chek Active  Strp (Glucose Blood) .... As Directed  Allergies (verified): 1)  ! 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) OSTEOARTHRITIS (ICD-715.90) HYPERLIPIDEMIA (ICD-272.4) DIABETES MELLITUS (ICD-250.00) HYPERTENSION (ICD-401.9) History of congestive heart failure secondary to diastolic dysfunction  Past Surgical History: Last updated: 05/13/2007 APPENDECTOMY, HX OF (ICD-V45.79) * REATTACHMENT GREAT TOE STATUS, OTHER TOE(S) AMPUTATION (ICD-V49.72) CORONARY ARTERY BYPASS GRAFT, HX OF (ICD-V45.81)  Social History: Last updated: 08/25/2009 married '50 - widowed 78yrs; remarried 34's- widowed '96 7 sons, 5 daughters; 30 grandchildren; 15 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 Tobacco Use - Former.   Review of Systems       The patient complains of dyspnea on exertion.  The patient denies anorexia, fever, weight loss, weight gain, decreased hearing, chest pain, prolonged cough, abdominal pain, severe indigestion/heartburn, muscle weakness, difficulty walking, depression, abnormal bleeding, and angioedema.    Physical Exam  General:  alert, well-developed, well-nourished, and well-hydrated AA male in no distress and looking younger than his age.   Head:  normocephalic and atraumatic.   Eyes:  C&S clear Neck:  supple and full ROM.   Lungs:  normal respiratory effort, no intercostal retractions, no accessory  muscle use, normal breath sounds, no crackles, and no wheezes.   Heart:  normal rate and regular rhythm.   Msk:  normal ROM.   Neurologic:  alert & oriented X3, cranial nerves II-XII intact, and gait normal.   Skin:  turgor normal and color normal.   Psych:  Oriented X3, memory intact for recent and remote, and normally interactive.     Impression & Recommendations:  Problem # 1:  CARDIOMYOPATHY, ISCHEMIC (ICD-414.8) Patient with mild increased SOB and concern for pleural effusion. His last echo was '08 but  Myoview with EF 49% Aug '11  Plan - BNP           CXR           increase lasix to 4omg daily           2 D echo.  His updated medication list for this problem includes:    Cozaar 50 Mg Tabs (Losartan potassium) .Marland Kitchen... Take one tab by mouth once daily    Furosemide 20 Mg Tabs (Furosemide) .Marland Kitchen... Take one tablet by mouth daily.    Adult Aspirin Ec Low Strength 81 Mg Tbec (Aspirin) .Marland Kitchen... Take 1 tablet by mouth once a day    Toprol Xl 25 Mg Xr24h-tab (Metoprolol succinate) ..... One by mouth qd  Orders: TLB-BNP (B-Natriuretic Peptide) (83880-BNPR) T-2 View CXR (71020TC) Cardiology Referral (Cardiology)  Patient with mildly elevated BNP. CXR suggestive of edema  Plan - increase furosemide to 40mg  daily.           proceed with 2D echo  Problem # 2:  HYPERTENSION (ICD-401.9)  His updated medication list for this problem includes:    Cozaar 50 Mg Tabs (Losartan potassium) .Marland Kitchen... Take one tab by mouth once daily    Furosemide 20 Mg Tabs (Furosemide) .Marland Kitchen... Take one tablet by mouth daily.    Toprol Xl 25 Mg Xr24h-tab (Metoprolol succinate) ..... One by mouth qd  BP today: 132/80 Prior BP: 168/98 (09/22/2010)  Adequate control.  Problem # 3:  HYPERLIPIDEMIA (ICD-272.4) Due for lipid panel with recommendations to follow.   His updated medication list for this problem includes:    Zetia 10 Mg Tabs (Ezetimibe) .Marland Kitchen... Take one tab by mouth mouth once daily    Simvastatin 40 Mg Tabs (Simvastatin) .Marland Kitchen... Take one tablet by mouth daily at bedtime  Orders: TLB-Lipid Panel (80061-LIPID) TLB-Hepatic/Liver Function Pnl (80076-HEPATIC)  Addendum - LDL 41  excellent control  Addendum - good control of lipids. continue present meds.   Problem # 4:  DIABETES MELLITUS (ICD-250.00)  Reviwed CBGs andhe has low morning readings.  Plan - continue levemir at 24 units           A1C  His updated medication list for this problem includes:    Cozaar 50 Mg Tabs (Losartan potassium) .Marland Kitchen... Take one tab  by mouth once daily    Adult Aspirin Ec Low Strength 81 Mg Tbec (Aspirin) .Marland Kitchen... Take 1 tablet by mouth once a day    Levemir Flexpen 100 Unit/ml Soln (Insulin detemir) .Marland KitchenMarland KitchenMarland KitchenMarland Kitchen 30 units at bedtime  Addendum - A1C 10% up from 7.1 % in '09.   Plan - continue levemir. continue keeping records. If CBG runing high later in the day will consider adding oral agent  Orders: TLB-A1C / Hgb A1C (Glycohemoglobin) (83036-A1C)  Complete Medication List: 1)  Cozaar 50 Mg Tabs (Losartan potassium) .... Take one tab by mouth once daily 2)  Furosemide 20 Mg Tabs (Furosemide) .... Take one tablet by mouth  daily. 3)  Zetia 10 Mg Tabs (Ezetimibe) .... Take one tab by mouth mouth once daily 4)  Folic Acid 1 Mg Tabs (Folic acid) .... Take one tablet once daily 5)  Adult Aspirin Ec Low Strength 81 Mg Tbec (Aspirin) .... Take 1 tablet by mouth once a day 6)  Toprol Xl 25 Mg Xr24h-tab (Metoprolol succinate) .... One by mouth qd 7)  Simvastatin 40 Mg Tabs (Simvastatin) .... Take one tablet by mouth daily at bedtime 8)  Gabapentin 100 Mg Caps (Gabapentin) .Marland Kitchen.. 1 by mouth at bedtime for  burning legs 9)  Levemir Flexpen 100 Unit/ml Soln (Insulin detemir) .... 30 units at bedtime 10)  Dexilant 60 Mg Cpdr (Dexlansoprazole) .Marland Kitchen.. 1 by mouth qam 11)  Accu-chek Active Strp (Glucose blood) .... As directed  Patient: [name] Note: All result statuses are Final unless otherwise noted.  Tests: (1) Lipid Panel (LIPID)   Cholesterol               88 mg/dL                    1-191     ATP III Classification            Desirable:  < 200 mg/dL                    Borderline High:  200 - 239 mg/dL               High:  > = 240 mg/dL   Triglycerides             48.0 mg/dL                  4.7-829.5     Normal:  <150 mg/dL     Borderline High:  621 - 199 mg/dL   HDL                  [L]  30.86 mg/dL                 >57.84   VLDL Cholesterol          9.6 mg/dL                   6.9-62.9   LDL Cholesterol           41 mg/dL                     5-28  CHO/HDL Ratio:  CHD Risk                             2                    Men          Women     1/2 Average Risk     3.4          3.3     Average Risk          5.0          4.4     2X Average Risk          9.6          7.1     3X Average Risk          15.0          11.0  Tests: (2) Hepatic/Liver Function Panel (HEPATIC)   Total Bilirubin           0.7 mg/dL                   8.4-1.3   Direct Bilirubin          0.1 mg/dL                   2.4-4.0   Alkaline Phosphatase      100 U/L                     39-117   AST                  [H]  47 U/L                      0-37   ALT                       47 U/L                      0-53   Total Protein             6.8 g/dL                    1.0-2.7   Albumin                   3.5 g/dL                    2.5-3.6  Tests: (3) Hemoglobin A1C (A1C)   Hemoglobin A1C       [H]  10.0 %                      4.6-6.5     Glycemic Control Guidelines for People with Diabetes:     Non Diabetic:  <6%     Goal of Therapy: <7%     Additional Action Suggested:  >8%   Tests: (4) B-Type Natiuretic Peptide (BNPR)  B-Type Natriuetic Peptide                        [H]  608.2 pg/mL                 0.0-100.0  Orders Added: 1)  TLB-Lipid Panel [80061-LIPID] 2)  TLB-Hepatic/Liver Function Pnl [80076-HEPATIC] 3)  TLB-A1C / Hgb A1C (Glycohemoglobin) [83036-A1C] 4)  TLB-BNP (B-Natriuretic Peptide) [83880-BNPR] 5)  T-2 View CXR [71020TC] 6)  Cardiology Referral [Cardiology] 7)  Est. Patient Level IV [64403]    Laboratory Results   Blood Tests    Date/Time Reported: Ami Bullins CMA  October 22, 2010 10:31 AM   CBG Random:: 237mg /dL

## 2010-10-29 NOTE — Assessment & Plan Note (Signed)
Summary: 2 wk f/u / #/cd   Vital Signs:  Patient profile:   77 year old male Height:      71 inches Weight:      204 pounds BMI:     28.56 O2 Sat:      99 % on Room air Temp:     97.9 degrees F oral Pulse rate:   77 / minute BP sitting:   152 / 78  (left arm) Cuff size:   large  Vitals Entered By: Bill Salinas CMA (September 07, 2010 11:01 AM)  O2 Flow:  Room air CC: pt here for follow up on diabetes, his fasting CBG this am at 8:50 was 226, I looked through his meter and it seems that CBG are tending to run in the high 100s to high 200s with no trend/ ab   Primary Care Provider:  Hanadi Stanly  CC:  pt here for follow up on diabetes, his fasting CBG this am at 8:50 was 226, and I looked through his meter and it seems that CBG are tending to run in the high 100s to high 200s with no trend/ ab.  History of Present Illness: patient presents for follow-up of diabetes. He has been taking his levemir as directed and check CBGs every monring. He has no complaints and does feel a little better.   Current Medications (verified): 1)  Cozaar 50 Mg  Tabs (Losartan Potassium) .... Take One Tab By Mouth Once Daily 2)  Furosemide 20 Mg Tabs (Furosemide) .... Take One Tablet By Mouth Daily. 3)  Zetia 10 Mg  Tabs (Ezetimibe) .... Take One Tab By Mouth Mouth Once Daily 4)  Folic Acid 1 Mg  Tabs (Folic Acid) .... Take One Tablet Once Daily 5)  Adult Aspirin Ec Low Strength 81 Mg  Tbec (Aspirin) .... Take 1 Tablet By Mouth Once A Day 6)  Toprol Xl 25 Mg Xr24h-Tab (Metoprolol Succinate) .... One By Mouth Qd 7)  Simvastatin 40 Mg Tabs (Simvastatin) .... Take One Tablet By Mouth Daily At Bedtime 8)  Gabapentin 100 Mg Caps (Gabapentin) .Marland Kitchen.. 1 By Mouth At Bedtime For  Burning Legs 9)  Levemir Flexpen 100 Unit/ml Soln (Insulin Detemir) .... 25 Units At Bedtime 10)  Dexilant 60 Mg Cpdr (Dexlansoprazole) .Marland Kitchen.. 1 By Mouth Qam  Allergies (verified): 1)  ! * Bee Stings PMH-FH-SH reviewed-no changes except  otherwise noted  Review of Systems  The patient denies anorexia, weight loss, weight gain, chest pain, dyspnea on exertion, abdominal pain, incontinence, muscle weakness, difficulty walking, and enlarged lymph nodes.    Physical Exam  General:  alert, well-developed, well-nourished, and well-hydrated.   Lungs:  normal respiratory effort.   Heart:  normal rate and regular rhythm.   Pulses:  2+ radial Neurologic:  alert & oriented X3 and gait normal.     Impression & Recommendations:  Problem # 1:  DIABETES MELLITUS (ICD-250.00) reviewed CBG record on glucometer: running CBGs 190-220.  Plan - increase levemir to 30u at bedtime, if after 5 days CBGs still too high will increase to 35 u           follow-up (no charge) 2 weeks.   His updated medication list for this problem includes:    Cozaar 50 Mg Tabs (Losartan potassium) .Marland Kitchen... Take one tab by mouth once daily    Adult Aspirin Ec Low Strength 81 Mg Tbec (Aspirin) .Marland Kitchen... Take 1 tablet by mouth once a day    Levemir Flexpen 100 Unit/ml Soln (Insulin detemir) .Marland KitchenMarland KitchenMarland KitchenMarland Kitchen  25 units at bedtime  Problem # 2:  ABDOMINAL PAIN, RIGHT UPPER QUADRANT (ICD-789.01) resolved for now.   Complete Medication List: 1)  Cozaar 50 Mg Tabs (Losartan potassium) .... Take one tab by mouth once daily 2)  Furosemide 20 Mg Tabs (Furosemide) .... Take one tablet by mouth daily. 3)  Zetia 10 Mg Tabs (Ezetimibe) .... Take one tab by mouth mouth once daily 4)  Folic Acid 1 Mg Tabs (Folic acid) .... Take one tablet once daily 5)  Adult Aspirin Ec Low Strength 81 Mg Tbec (Aspirin) .... Take 1 tablet by mouth once a day 6)  Toprol Xl 25 Mg Xr24h-tab (Metoprolol succinate) .... One by mouth qd 7)  Simvastatin 40 Mg Tabs (Simvastatin) .... Take one tablet by mouth daily at bedtime 8)  Gabapentin 100 Mg Caps (Gabapentin) .Marland Kitchen.. 1 by mouth at bedtime for  burning legs 9)  Levemir Flexpen 100 Unit/ml Soln (Insulin detemir) .... 25 units at bedtime 10)  Dexilant 60 Mg Cpdr  (Dexlansoprazole) .Marland Kitchen.. 1 by mouth qam  Patient Instructions: 1)  Diabetes - reviewed the readings on your meter: 190's-220's. Definitely need to increase to 30 units of Levemir every night. If after 5 days if the readings are still running greater than 150 you will need to increase the levemir to 35 units. Continue on a no sugar diet with limited carborhydrates - rice, potatoes, bread, etc.    Orders Added: 1)  Est. Patient Level II [[address]109]

## 2010-10-29 NOTE — Assessment & Plan Note (Signed)
Summary: 2 WEEK FOLLOW UP-LB   Vital Signs:  Patient profile:   77 year old male Height:      71 inches Weight:      201 pounds BMI:     28.14 O2 Sat:      96 % on Room air Temp:     98.3 degrees F oral Pulse rate:   77 / minute BP sitting:   168 / 98  (left arm) Cuff size:   large  Vitals Entered By: Bill Salinas CMA (September 22, 2010 10:34 AM)  O2 Flow:  Room air CC: follow-up visit on diabetes/ ab   Primary Care Provider:  Shiva Karis  CC:  follow-up visit on diabetes/ ab.  History of Present Illness: for courtesy blood sugar check on levemit. Glucometer reveals readings for the past 7 readings of 160's except for one low reading of 102 on Dec 23.   Current Medications (verified): 1)  Cozaar 50 Mg  Tabs (Losartan Potassium) .... Take One Tab By Mouth Once Daily 2)  Furosemide 20 Mg Tabs (Furosemide) .... Take One Tablet By Mouth Daily. 3)  Zetia 10 Mg  Tabs (Ezetimibe) .... Take One Tab By Mouth Mouth Once Daily 4)  Folic Acid 1 Mg  Tabs (Folic Acid) .... Take One Tablet Once Daily 5)  Adult Aspirin Ec Low Strength 81 Mg  Tbec (Aspirin) .... Take 1 Tablet By Mouth Once A Day 6)  Toprol Xl 25 Mg Xr24h-Tab (Metoprolol Succinate) .... One By Mouth Qd 7)  Simvastatin 40 Mg Tabs (Simvastatin) .... Take One Tablet By Mouth Daily At Bedtime 8)  Gabapentin 100 Mg Caps (Gabapentin) .Marland Kitchen.. 1 By Mouth At Bedtime For  Burning Legs 9)  Levemir Flexpen 100 Unit/ml Soln (Insulin Detemir) .... 25 Units At Bedtime 10)  Dexilant 60 Mg Cpdr (Dexlansoprazole) .Marland Kitchen.. 1 By Mouth Qam  Allergies (verified): 1)  ! * Bee Stings   Impression & Recommendations:  Problem # 1:  DIABETES MELLITUS (ICD-250.00)  doing better with insulin and feeling better.   Plan - increase levemir to 30 units daily.           return in one month.   His updated medication list for this problem includes:    Cozaar 50 Mg Tabs (Losartan potassium) .Marland Kitchen... Take one tab by mouth once daily    Adult Aspirin Ec Low Strength  81 Mg Tbec (Aspirin) .Marland Kitchen... Take 1 tablet by mouth once a day    Levemir Flexpen 100 Unit/ml Soln (Insulin detemir) .Marland KitchenMarland KitchenMarland KitchenMarland Kitchen 30 units at bedtime  Orders: No Charge Patient Arrived (NCPA0) (NCPA0)  Complete Medication List: 1)  Cozaar 50 Mg Tabs (Losartan potassium) .... Take one tab by mouth once daily 2)  Furosemide 20 Mg Tabs (Furosemide) .... Take one tablet by mouth daily. 3)  Zetia 10 Mg Tabs (Ezetimibe) .... Take one tab by mouth mouth once daily 4)  Folic Acid 1 Mg Tabs (Folic acid) .... Take one tablet once daily 5)  Adult Aspirin Ec Low Strength 81 Mg Tbec (Aspirin) .... Take 1 tablet by mouth once a day 6)  Toprol Xl 25 Mg Xr24h-tab (Metoprolol succinate) .... One by mouth qd 7)  Simvastatin 40 Mg Tabs (Simvastatin) .... Take one tablet by mouth daily at bedtime 8)  Gabapentin 100 Mg Caps (Gabapentin) .Marland Kitchen.. 1 by mouth at bedtime for  burning legs 9)  Levemir Flexpen 100 Unit/ml Soln (Insulin detemir) .... 30 units at bedtime 10)  Dexilant 60 Mg Cpdr (Dexlansoprazole) .Marland Kitchen.. 1 by mouth  qam  Other Orders: Flu Vaccine 68yrs + (09604) Admin 1st Vaccine (54098)  Patient Instructions: 1)  diabetes - seems to be doing better. Plan is to continue levemir at 30 units at bedtime. Return visit in 1 month. Prescription for levemir pens sent to pharmacy.  Prescriptions: LEVEMIR FLEXPEN 100 UNIT/ML SOLN (INSULIN DETEMIR) 30 units at bedtime  #3 pens x 3   Entered and Authorized by:   Jacques Navy MD   Signed by:   Jacques Navy MD on 09/22/2010   Method used:   Electronically to        [facility] (retail)       [address]       [place], [place]  [id number]       Ph: [phone]       Fax: [phone]   RxID:   [id number]    Orders Added: 1)  Flu Vaccine 88yrs + [44034] 2)  Admin 1st Vaccine [90471] 3)  No Charge Patient Arrived (NCPA0) [NCPA0]   Immunizations Administered:  Influenza Vaccine # 1:    Vaccine Type: Fluvax 3+    Site: left  deltoid    Mfr: Sanofi Pasteur    Dose: 0.5 ml    Route: IM    Given by: Ami Bullins CMA    Exp. Date: 03/27/2011    Lot #: [id number]    VIS given: 04/21/10 version given September 22, 2010.  Flu Vaccine Consent Questions:    Do you have a history of severe allergic reactions to this vaccine? no    Any prior history of allergic reactions to egg and/or gelatin? no    Do you have a sensitivity to the preservative Thimersol? no    Do you have a past history of Guillan-Barre Syndrome? no    Do you currently have an acute febrile illness? no    Have you ever had a severe reaction to latex? no    Vaccine information given and explained to patient? yes   Immunizations Administered:  Influenza Vaccine # 1:    Vaccine Type: Fluvax 3+    Site: left deltoid    Mfr: Sanofi Pasteur    Dose: 0.5 ml    Route: IM    Given by: Ami Bullins CMA    Exp. Date: 03/27/2011    Lot #: [id number]    VIS given: 04/21/10 version given September 22, 2010.

## 2010-11-02 ENCOUNTER — Ambulatory Visit (HOSPITAL_COMMUNITY): Payer: MEDICARE | Attending: Internal Medicine

## 2010-11-02 DIAGNOSIS — I10 Essential (primary) hypertension: Secondary | ICD-10-CM | POA: Insufficient documentation

## 2010-11-02 DIAGNOSIS — R0602 Shortness of breath: Secondary | ICD-10-CM

## 2010-11-02 DIAGNOSIS — E785 Hyperlipidemia, unspecified: Secondary | ICD-10-CM | POA: Insufficient documentation

## 2010-11-02 DIAGNOSIS — I2589 Other forms of chronic ischemic heart disease: Secondary | ICD-10-CM

## 2010-11-02 DIAGNOSIS — R0989 Other specified symptoms and signs involving the circulatory and respiratory systems: Secondary | ICD-10-CM | POA: Insufficient documentation

## 2010-11-02 DIAGNOSIS — R0609 Other forms of dyspnea: Secondary | ICD-10-CM | POA: Insufficient documentation

## 2010-11-12 ENCOUNTER — Ambulatory Visit (INDEPENDENT_AMBULATORY_CARE_PROVIDER_SITE_OTHER): Payer: MEDICARE | Admitting: Internal Medicine

## 2010-11-12 ENCOUNTER — Encounter: Payer: Self-pay | Admitting: Internal Medicine

## 2010-11-12 DIAGNOSIS — E119 Type 2 diabetes mellitus without complications: Secondary | ICD-10-CM

## 2010-11-12 DIAGNOSIS — I2589 Other forms of chronic ischemic heart disease: Secondary | ICD-10-CM

## 2010-11-18 NOTE — Assessment & Plan Note (Signed)
Summary: follow up on diabetes/ sls   Vital Signs:  Patient profile:   77 year old male Height:      67 inches Weight:      209 pounds O2 Sat:      94 % on Room air Temp:     97.6 degrees F oral Pulse rate:   85 / minute Resp:     16 per minute BP sitting:   154 / 88  (left arm)  Vitals Entered By: Burnard Leigh CMA(AAMA) (November 12, 2010 11:08 AM)  O2 Flow:  Room air CC: Pt here for Diabetes recheck/sls,cma Is Patient Diabetic? Yes Did you bring your meter with you today? Yes   Primary Care Provider:  Hjalmar Ballengee  CC:  Pt here for Diabetes recheck/sls and cma.  History of Present Illness: [name] presents for follow-up of diabetes and cardiovascular disease. He has  been feeling well. Reviewed C BGreading off his glucometer: he has many readings that are in the 200's and occasional readings that were too low. He is on levemir 24units.   Chart reviewed: he had a 2 D echo Feb 6th with inferior-lateral wall hypokenesis but EF is 55%. CXR prior to echo revealed mild interstial edema. Since his last vist and with the regular use of diuretics he has been doing better with less SOB and nocturnal dyspnea - able to sleep flat.   Patinet had previously been on actos which was stopped in Jan '09 - mild fluid overload was the cause.   Current Medications (verified): 1)  Cozaar 50 Mg  Tabs (Losartan Potassium) .... Take One Tab By Mouth Once Daily 2)  Furosemide 40 Mg Tabs (Furosemide) .Marland Kitchen.. 1 Tablet Once A Day 3)  Zetia 10 Mg  Tabs (Ezetimibe) .... Take One Tab By Mouth Mouth Once Daily 4)  Folic Acid 1 Mg  Tabs (Folic Acid) .... Take One Tablet Once Daily 5)  Adult Aspirin Ec Low Strength 81 Mg  Tbec (Aspirin) .... Take 1 Tablet By Mouth Once A Day 6)  Toprol Xl 25 Mg Xr24h-Tab (Metoprolol Succinate) .... One By Mouth Qd 7)  Simvastatin 40 Mg Tabs (Simvastatin) .... Take One Tablet By Mouth Daily At Bedtime 8)  Gabapentin 100 Mg Caps (Gabapentin) .Marland Kitchen.. 1 By Mouth At Bedtime For  Burning  Legs 9)  Levemir Flexpen 100 Unit/ml Soln (Insulin Detemir) .... 30 Units At Bedtime 10)  Dexilant 60 Mg Cpdr (Dexlansoprazole) .Marland Kitchen.. 1 By Mouth Qam 11)  Accu-Chek Active  Strp (Glucose Blood) .... As Directed  Allergies (verified): 1)  ! 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  Review of Systems  The patient denies anorexia, fever, weight loss, weight gain, decreased hearing, hoarseness, dyspnea on exertion, peripheral edema, abdominal pain, severe indigestion/heartburn, incontinence, muscle weakness, transient blindness, unusual weight change, and enlarged lymph nodes.    Physical Exam  General:  alert, well-developed, well-nourished, and well-hydrated.   Eyes:  C&S clear Neck:  supple.   Lungs:  normal respiratory  effort, no intercostal retractions, no accessory muscle use, normal breath sounds, no crackles, and no wheezes.   Heart:  normal rate and regular rhythm.   Neurologic:  alert & oriented X3 and gait normal.   Skin:  turgor normal and color normal.   Psych:  Oriented X3, normally interactive, and good eye contact.     Impression & Recommendations:  Problem # 1:  DIABETES MELLITUS (ICD-250.00) Continued improvement in CBGs but not at goal. Provided 2 sample levemir pens.  Plan - continue levemir at 24 units - to avoid  low CBGs in AM           add amaryl 2mg  for continued daytime control.           ROV 1 month.  His updated medication list for this problem includes:    Cozaar 50 Mg Tabs (Losartan potassium) .Marland Kitchen... Take one tab by mouth once daily    Adult Aspirin Ec Low Strength 81 Mg Tbec (Aspirin) .Marland Kitchen... Take 1 tablet by mouth once a day    Levemir Flexpen 100 Unit/ml Soln (Insulin detemir) .Marland KitchenMarland KitchenMarland KitchenMarland Kitchen 30 units at bedtime    Glimepiride 2 Mg Tabs (Glimepiride) .Marland Kitchen... 1 by mouth q am for diabetes  Problem # 2:  CARDIOMYOPATHY, ISCHEMIC (ICD-414.8) Reviewed 2 De cho aqs noted - mild cardiomyopathy. He is doing better at this time.  Plan - continue present medications.  His updated medication list for this problem includes:    Cozaar 50 Mg Tabs (Losartan potassium) .Marland Kitchen... Take one tab by mouth once daily    Furosemide 40 Mg Tabs (Furosemide) .Marland Kitchen... 1 tablet once a day    Adult Aspirin Ec Low Strength 81 Mg Tbec (Aspirin) .Marland Kitchen... Take 1 tablet by mouth once a day    Toprol Xl 25 Mg Xr24h-tab (Metoprolol succinate) ..... One by mouth qd  Complete Medication List: 1)  Cozaar 50 Mg Tabs (Losartan potassium) .... Take one tab by mouth once daily 2)  Furosemide 40 Mg Tabs (Furosemide) .Marland Kitchen.. 1 tablet once a day 3)  Zetia 10 Mg Tabs (Ezetimibe) .... Take one tab by mouth mouth once daily 4)  Folic Acid 1 Mg Tabs (Folic acid) .... Take one tablet once daily 5)  Adult Aspirin Ec Low Strength 81 Mg Tbec (Aspirin) .... Take 1 tablet by mouth once a day 6)  Toprol Xl 25 Mg Xr24h-tab (Metoprolol succinate) .... One by mouth qd 7)  Simvastatin 40 Mg Tabs (Simvastatin) .... Take one tablet by mouth daily at bedtime 8)  Gabapentin 100 Mg Caps (Gabapentin) .Marland Kitchen.. 1 by mouth at bedtime for  burning legs 9)  Levemir Flexpen 100 Unit/ml Soln (Insulin detemir) .... 30 units at bedtime 10)  Dexilant 60 Mg Cpdr (Dexlansoprazole) .Marland Kitchen.. 1 by mouth qam 11)  Accu-chek Active Strp (Glucose blood) .... As directed 12)  Glimepiride 2 Mg Tabs  (Glimepiride) .Marland Kitchen.. 1 by mouth q am for diabetes  Patient Instructions: 1)  Diabetes- continuing to do better. Plan - continue levemir 24 units at bedtime, start glemipiride 2mg  daily.  2)  Heart - doing OK. The 2 D echo shows that there is some decreased wall motion at the base of the heart. The Ejection Fraction is 55% with normal  being 60-65%. Will continue present medications. 3)  Blood pressure is just a little too high. Continue your present medications.  Prescriptions: GLIMEPIRIDE 2 MG TABS (GLIMEPIRIDE) 1 by mouth q AM for diabetes  #30 x 12   Entered and Authorized by:   Casimiro Needle  Esther Hardy MD   Signed by:   Jacques Navy MD on 11/12/2010   Method used:   Electronically to        [facility] [address] (retail)       [address]       [place], [place]  [id number]       Ph: [phone]       Fax: [phone]   RxID:   [id number]    Orders Added: 1)  Est. Patient Level III [24401]

## 2010-11-23 ENCOUNTER — Telehealth: Payer: Self-pay | Admitting: Internal Medicine

## 2010-12-03 NOTE — Progress Notes (Signed)
  Phone Note Refill Request Message from:  Fax from Pharmacy on November 23, 2010 9:32 AM  Refills Requested: Medication #1:  ACCU-CHEK ACTIVE  STRP as directed Mr Grealish test twice a day. He needs new Rx that states two times a day for test strips so that his insurance will pay.  Initial call taken by: Ami Bullins CMA,  November 23, 2010 9:32 AM    New/Updated Medications: ACCU-CHEK ACTIVE  STRP (GLUCOSE BLOOD) test two times a day Prescriptions: ACCU-CHEK ACTIVE  STRP (GLUCOSE BLOOD) test two times a day  #60 x 6   Entered by:   Ami Bullins CMA   Authorized by:   Jacques Navy MD   Signed by:   Bill Salinas CMA on 11/23/2010   Method used:   Electronically to        [facility] (retail)       [address]       [place], [place]  [id number]       Ph: [phone]       Fax: [phone]   RxID:   [id number]

## 2010-12-18 ENCOUNTER — Other Ambulatory Visit: Payer: Self-pay | Admitting: Internal Medicine

## 2011-01-19 ENCOUNTER — Other Ambulatory Visit: Payer: Self-pay | Admitting: Internal Medicine

## 2011-01-25 ENCOUNTER — Telehealth: Payer: Self-pay | Admitting: Cardiology

## 2011-02-09 NOTE — Assessment & Plan Note (Signed)
[facility]                            CARDIOLOGY OFFICE NOTE   [name], [name]                     MRN:          [id number]  DATE:07/01/2008                            DOB:          May 04, 1931    [name] comes in today as a walk-in because of some chest pain since  last Thursday, 4 days ago.  He described as some burning along his  midline sternotomy as well as some sharp pains under his left breast.  This is on the same side that he had a industrial accident which  required removal of that breast tissue.   He denies any pleuritic chest pain.  He says he does cough and it makes  his chest feels sore.  He denies any fever, chills, or hemoptysis.  He  had no orthopnea, PND, or peripheral edema.   For his problem list see Dr. Ludwig Clarks note from October 23, 2007.   He has had no clear-cut angina or ischemic symptoms.   He also says when he urinates his feet burn.   His meds today are unchanged since his last visit here, except his  Cozaar has been increased to 100 mg per day.  Please the maintenance  medication list.   PHYSICAL EXAMINATION:  VITAL SIGNS:  Blood pressure is 148/76, his pulse  is 80 and regular.  Respiration rate is 18.  GENERAL:  He is alert and oriented x3.  He is in no acute distress.  SKIN:  Warm and dry.  HEENT:  Other than some graying is within normal limits.  Dentition is  in poor repair.  Carotids upstrokes were equal bilaterally without  bruits.  Thyroid is not enlarged.  Trachea is midline.  LUNGS:  Clear to auscultation with no rub, rales, or rhonchi.  His  sternotomy site is nontender.  He has no major chest wall tenderness to  deep palpation.  HEART:  A regular rate and rhythm.  No rub or gallop.  ABDOMEN:  Soft, good bowel sounds.  EXTREMITIES:  No sinus, clubbing, or edema.  There is no sign of DVT.  Pulses are intact.   EKG:  Sinus rhythm with left ventricular hypertrophy with ST-segment  changes in  1, aVL, as well as in V6.  The change in V6 is slightly  accentuated from his last ECG.   I had a long talk with Mr. Nole today.  I do not think this is cardiac  pain.  I have advised some p.r.n. Tylenol.  He might also use some warm  heat to his chest.  He will see Dr. [name] back to schedule.     Thomas C. Daleen Squibb, MD, [facility]  Electronically Signed   TCW/MedQ  DD: 07/01/2008  DT: 07/02/2008  Job #: [id number]

## 2011-02-09 NOTE — Assessment & Plan Note (Signed)
[name]                            CARDIOLOGY OFFICE NOTE   JOLAN, UPCHURCH                     MRN:          [id number]  DATE:12/02/2008                            DOB:          1930-10-31    [name] is a gentleman who has a history of coronary artery disease,  status post coronary artery bypass graft in 2006.  His most recent  Myoview was performed on November 17, 2006.  At that time, he had an  ejection fraction of 46% and there was prior inferior and apical  infarct, but no ischemia.  He also has cerebrovascular disease.  Since I  last saw him, he is doing reasonably well.  He denies any increased  dyspnea on exertion, orthopnea, PND, palpitations, presyncope, syncope,  or chest pain.  He occasionally has mild pedal edema.   MEDICATIONS:  1. Lasix 40 mg p.o. daily.  2. Metformin 1000 mg p.o. b.i.d.  3. Glipizide XL 10 mg p.o. daily.  4. Zetia 10 mg p.o. daily.  5. Folate 1 mg p.o. daily.  6. Cozaar 100 mg p.o. daily.  7. Amitriptyline 50 mg p.o. daily.  8. Aspirin 81 mg p.o. daily.   PHYSICAL EXAMINATION:  VITAL SIGNS:  Shows a blood pressure of 173/92  and his pulse is 106, weight 216 pounds.  HEENT:  Normal.  NECK:  Supple.  CHEST:  Clear.  CARDIOVASCULAR:  Tachycardic rate with a regular rhythm.  There is a 2/6  systolic murmur at the apex.  ABDOMEN:  Shows no tenderness.  EXTREMITIES:  Showed trace edema.   His electrocardiogram shows sinus tachycardia rate of 101.  There is a  left anterior fascicular block.  There is a left ventricle hypertrophy.  There is a borderline first-degree AV block.   DIAGNOSES:  1. Coronary artery disease, status post coronary artery bypassing      graft - the patient is doing well from symptomatic standpoint with      no chest pain.  His most recent Myoview showed no ischemia.  We      will continue his medical therapy including his angiotensin      receptor blocker, aspirin.  We are also  resuming his statin and      beta-blocker.  Note, he does not smoke.  2. History of congestive heart failure secondary to diastolic      dysfunction - the patient will continue his present dose of Lasix.  3. Hypertension - his blood pressure is elevated today.  We will      resume his beta-blocker (Toprol 50 mg p.o. daily).  We will track      this and adjust as read as indicated.  I will check a basic      metabolic panel to follow his potassium and renal function.  4. Hyperlipidemia - the patient is not on his Lipitor as he was      previously.  He is unclear about the reason for this, but there is      an issue of finances.  We will instead treat with Zocor 40 mg  p.o.      daily and check lipids and liver in 6 weeks, and adjust as      indicated.  5. Diabetes mellitus.  6. Cerebrovascular disease - he needs follow up carotid Dopplers.  7. History of mediastinal adenopathy and anemia - management per Dr.      Ranell Patrick.   He will see me back in 9 months.     [name], MD, [facility]  Electronically Signed    BSC/MedQ  DD: 12/02/2008  DT: 12/03/2008  Job #: [id number]

## 2011-02-09 NOTE — Assessment & Plan Note (Signed)
[facility]                            CARDIOLOGY OFFICE NOTE   Parker, Cooke                     MRN:          [id number]  DATE:10/23/2007                            DOB:          March 17, 1931    [name] is a pleasant gentleman who has a history of coronary artery  disease.  He is status post coronary bypassing graft in 2006.  His most  recent Myoview was performed on November 17, 2006 due to chest pain.  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  There was no ischemia.  Ejection fraction was 46%.  He also has mild to moderate cerebrovascular  disease.  Since I last saw him he is doing well.  There is no dyspnea,  chest pain, palpitations or syncope.  There is occasional minimal pedal  edema.  He is complaining of some pain in his abdomen predominantly when  he has not eaten by his report.  He takes Rolaids for it.  He does not  have exertional chest pain.   MEDICATIONS:  The Toprol 50 mg p.o. b.i.d., Lasix 40 mg p.o. daily,  Lipitor 40 mg daily, metformin 1000 mg p.o. b.i.d., glipizide XL 10 mg  p.o. daily, Zetia 2 mg daily, folate 1 mg daily, aspirin 325 mg daily,  Cozaar 100 mg daily.   PHYSICAL EXAM:  Today shows a blood pressure of 160/81.  His pulse is  75.  Weight is 212 pounds.  HEENT:  Normal.  Neck is supple with no bruits.  CHEST:  Clear.  CARDIOVASCULAR:  Regular rhythm.  Abdominal exam shows no tenderness.  I cannot palpate a mass and there  is no bruits noted.  EXTREMITIES:  Show trace ankle edema.   Electrocardiogram shows sinus rhythm at a rate of 74.  There is a first-  degree AV block.  There is left axis deviation and left ventricle  hypertrophy is noted.  There are nonspecific T-wave changes.   DIAGNOSES:  1. Coronary disease test for bypass graft - he has had no chest pain      or increasing shortness of breath.  His recent Myoview was low      risk.  We will continue medical therapy including his ARB, beta-    blocker, aspirin and statin.  2. History of congestive heart failure secondary diastolic dysfunction      - he appears to be euvolemic on examination.  He will continue on      his Lasix and we will check a being that today.  3. Hypertension - his blood pressure is elevated we will add Norvasc 5      mg daily.  This can be increased as needed.  4. Hyperlipidemia - he will continue on his statin.  We will check      lipids and liver today.  Note his liver functions have been      elevated the past.  5. Diabetes mellitus.  6. History of mediastinal adenopathy followed by Dr. Debby Bud.  7. History of anemia followed by Dr. Debby Bud.  8. Cerebrovascular disease - he will need follow-up carotid  Dopplers      in January 2010.   He will continue risk factor modification including diet, exercise and I  will see him back in 1 year.     [name], MD, [facility]  Electronically Signed    BSC/MedQ  DD: 10/23/2007  DT: 10/23/2007  Job #: [id number]
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
## 2011-02-12 NOTE — Assessment & Plan Note (Signed)
[name]                           PRIMARY CARE OFFICE NOTE   [name], [name]                     MRN:          [id number]  DATE:11/08/2006                            DOB:          1931-05-09    [name] is a 77 year old African-American gentleman well known to me,  followed for non-insulin-dependent diabetes mellitus, hypertension,  known coronary artery disease, and congestive heart failure.  He was  recently hospitalized January 25 to October 26, 2006 for acute on  chronic diastolic congestive heart failure with pulmonary edema,  secondary to medication problem.  During his hospital stay he did well.  He was successfully diuresed.  At the time of discharge he was to have a  followup outpatient cardiac stress study and was to follow up with Dr.  Olga Cooke.   The patient has asked to see me for followup of his anemia, with  hemoglobin of 10.4 which was consistent during his hospitalization.   The patient's past medical history is well documented in previous chart  notes.  Chart review indicates that the patient's last colonoscopy was  January 01, 2004, showing colon polyps, diverticulosis, but no other  abnormalities.  Path report from the 2005 colonoscopy revealed 2  adenomatous polyps, 1 hyperplastic polyp, with no high-grade dysplasia  and the patient would be a candidate for followup in 2008 to 2010.   The patient's last transthoracic cardiac echo from September 05, 2006  showed a ventricular ejection fraction of 60% with normal left  ventricular size.  There is no significant valvular disease.  The  patient's last carotid Doppler study dates from October 19, 2005 where  he had 0-39% bilateral ICA stenosis which was unchanged from previous  studies, with a recommendation at that time for followup in 2 years,  although Dr. Jens Cooke feels the patient is due for followup study at  this time.  The patient did have ADIs done December 17, 2005 which were  normal at 1.1 bilaterally.   Review of the patient's laboratories from the hospital indicates that he  had normal renal function, with BUNs of 30-23 with creatinines of 1.2 to  1.42, with a GFR that was estimated to be 69 mm/minute.   I have reviewed the patient's lab work going back several years and it  was in 2006 when he started to develop mild anemia and since that time  his hemoglobin has run in the 11.3 to 10.4 range on a consistent basis.  The patient has had no renal imaging studies.   CURRENT MEDICATIONS:  1. Cozaar 50 mg a day.  2. Toprol 50 mg b.i.d.  3. Lasix 40 mg daily.  4. Lipitor 40 mg a day.  5. Potassium 20 mEq daily.  6. Metformin 1000 mg b.i.d.  7. Glipizide XL 10 mg daily.  8. Zetia 10 mg daily.  9. Folic acid 1 mg daily.  10.Aspirin 325 mg daily.   Review of systems is negative for constitutional, cardiovascular,  respiratory or GI problems at this time, although the patient did report  he had some wheezing this morning which  cleared when he became upright.   EXAM:  Temperature was 97.3, blood pressure 146/75, pulse 56, weight  209.  GENERAL APPEARANCE:  This is a solid, well-built African-American  gentleman who has a grip of steel.  HEENT:  Unremarkable.  CHEST:  He is moving air well, with no rales, wheezes, or rhonchi.  CARDIOVASCULAR:  2+ radial pulses.  Precordium was quiet.  His heart  rate was regular to my exam.  EXTREMITIES:  Without edema.   ASSESSMENT AND PLAN:  1. Cardiovascular:  Defer to [name] in regards to the      patient's management.  His blood pressure is running a little bit      high at today's visit.  He does not appear to have any      decompensation in regards to congestive failure or fluid      accumulation.  2. Anemia.  The patient may have anemia of chronic disease.  He is      diabetic as noted.  He also could have erythropoietin deficiency.      I suspect that his renal function may be over  estimated in labs.   PLAN:  Evaluation will include serum iron and ferritin level.  We will  get a reticulocyte count.  We will get an erythropoietin level.  We will  get a 24-hour urine for creatinine clearance to specifically quantify  the patient's renal function.  We will make recommendations based on the  patient's laboratory in regards to whether he has iron deficiency anemia  or whether he has a poor production anemia.  The patient will be  notified by phone of his lab results.  He will return to see me on a  p.r.n. basis.  The patient is instructed to keep his appointments as  scheduled with Dr. Jens Cooke and for planned nuclear stress study.     Parker Gess Norins, MD  Electronically Signed    MEN/MedQ  DD: 11/08/2006  DT: 11/08/2006  Job #: [id number]   cc:   [name]. Parker Som, MD, [facility]  Please send copy to patient.

## 2011-02-12 NOTE — Assessment & Plan Note (Signed)
[facility]                            CARDIOLOGY OFFICE NOTE   Parker, Cooke                     MRN:          [id number]  DATE:09/05/2006                            DOB:          [birth year]    [name] is a pleasant gentleman who has a history of coronary disease  status post coronary artery bypass graft and preserved LV function.  He  was seen on November 27 by Wende Bushy.  At that time, the patient  was complaining of shortness of breath, chest pain, and cough.  He was  placed on a higher dose of Lasix at 40 mg p.o. daily and was restarted  on Toprol.  An echocardiogram was ordered which was performed today.  He  was found to have normal LV function and trivial mitral regurgitation.  Note, chest x-ray previously performed on August 16, 2006, showed CHF  with small effusions.  Also of note, the patient did have BNP that was  elevated at 416 and the BUN and creatinine were normal.  His H and H  were 11.3 and 34.9, respectively.  Since that time, he does state that  his dyspnea has improved somewhat.  He denies any dyspnea on exertion or  orthopnea and there is no pedal edema.  He has chest pain predominantly  when she coughs.  There was no exertional chest pain.  He continues to  have a cough that questionably increases with lying flat.   His medications include:  1. Toprol 100 mg p.o. daily.  2. Lipitor 40 mg p.o. daily.  3. Folate 1 mg p.o. daily.  4. Glipizide XL 10 mg p.o. daily.  5. Aspirin 325 mg p.o. daily.  6. Enalapril 20 mg p.o. daily.  7. Lasix 20 mg p.o. daily.  8. Zetia 10 mg p.o. daily.  9. Metformin 1000 mg p.o. b.i.d.   PHYSICAL EXAM:  Shows a blood pressure of 142/68 and his pulse is 76.  He weighs 202 pounds, which compares to 209 pounds on November 27.  CHEST:  Clear.  CARDIOVASCULAR:  Regular rate and rhythm.  EXTREMITIES:  No edema.   DIAGNOSES:  1. Probable mild congestive heart failure/volume overload  improved      with higher dosed diuretics.  2. Continued cough.  3. Coronary artery disease status post coronary artery bypass graft.  4. Preserved left ventricular function.  5. Hypertension.  6. Diabetes mellitus.  7. Hyperlipidemia.  8. History of mediastinal adenopathy followed by Dr. Debby Bud.  9. History of anemia followed by Dr. Debby Bud.   PLAN:  Mr. Markuson is improved since he saw Marcelino Duster in the clinic.  We  will continue with his present dose of Lasix at 40 mg p.o. daily and we  will also add potassium 20 mEq p.o. daily.  I will check a BMET and a  BNP today.  I wonder if his ACE-inhibitor may be contributing to his  cough.  We will discontinue his Enalapril and I would add Cozaar 50 mg  p.o. daily and adjust as indicated.  His chest pain sounds to be related  to  musculoskeletal pain due to his increased cough.  Hopefully this will  improve as his cough improves.  I will see him back in approximately 4  weeks.     [name], MD, [facility]  Electronically Signed    BSC/MedQ  DD: 09/05/2006  DT: 09/05/2006  Job #: [id number]

## 2011-02-12 NOTE — Op Note (Signed)
NAME:  URBAN, NAVAL NO.:  [id number]   MEDICAL RECORD NO.:  [id number]          PATIENT TYPE:  OUT   LOCATION:  XRAY                         [facility]ACILITY[facility]  [facility]   PHYSICIAN:  Kathlee Nations Trigt III, M.D.DATE OF BIRTH:  1931/03/04   DATE OF PROCEDURE:  12/31/2004  DATE OF DISCHARGE:  12/29/2004                                 OPERATIVE REPORT   OPERATION:  Coronary artery bypass grafting x 3 (saphenous vein graft to  OM2, saphenous vein graft to LAD, saphenous vein graft to diagonal).   PRE- AND POSTOPERATIVE DIAGNOSIS:  Severe two-vessel coronary disease with  acute MI and moderate reduction in LV function, unstable angina.   SURGEON:  Kerin Perna, M.D.   ASSISTANT:  Coral Ceo, PA-C   ANESTHESIA:  General by Dr. Autumn Patty.   INDICATIONS:  The patient is 77 year old of male who presented with  pleuritic chest pain and symptoms of CHF and was found to have positive  cardiac enzymes. The chest x-ray showed right pleural effusion and a CT scan  was performed to rule out pulmonary was which was negative but did show some  mediastinal adenopathy. The patient underwent cardiac catheterization which  demonstrated severe two-vessel disease with 90% stenosis of the LAD diagonal  and total occlusion of the OM2 branch of the circumflex. Because of his  severe coronary disease, he is felt to be a candidate for surgical  evaluation and was not a percutaneous intervention candidate.   Prior to surgery, I reviewed the patient's cardiac cath. his CT scan, and 2-  D echo. I discussed the results of those studies with the patient. I  discussed indications and expected benefits of coronary bypass surgery with  Mr. Virden. I reviewed the major aspects of the planned procedure including  choice of conduit, the location of surgical incisions, use of general  anesthesia and cardiopulmonary bypass, and expected postoperative hospital  recovery.  I discussed with the  patient the risks to him of coronary bypass  surgery including risks of MI, CVA, bleeding, blood transfusion requirement,  infection, and death. He understood these implications for the surgery and  agreed to proceed with the operation as planned under what I felt was an  informed consent.   The patient's mediastinal adenopathy preoperatively was evaluated a PET  scan. This showed very limited metabolic activity of those lymph nodes which  were not felt to have a significant neoplastic potential.   OPERATIVE FINDINGS:  The patient had a dilated ventricle with some scarring  at the apex from an old MI. The saphenous vein was harvested endoscopically  from the right leg and was of good quality. The mammary artery was thickened  and was heavily adherent to the chest wall and did not have adequate flow to  be used as a conduit. The patient did not receive any blood transfusions  during this operation. A mediastinal lymph node was dissected out in the  precarinal space and submitted for pathologic analysis.   PROCEDURE:  The patient was brought to operating room and placed supine on  the operating table where general  anesthesia was induced under invasive  hemodynamic monitoring. The chest, abdomen and legs were prepped with  Betadine and draped as a sterile field. A sternal incision made at the  saphenous vein was harvested endoscopically from the right leg. The left  internal mammary artery was inspected and found to be very thickened and  involved in inflammatory scar process with the periosteum of the ribs and  after it was taken down, it did not have adequate flow to use as a conduit.  For that reason a vein graft used to graft the LAD.   The sternal retractor was placed in the pericardium, was opened and  suspended. Pursestrings were placed in the ascending aorta and right atrium.  Heparin was administered and ACT was documented as being therapeutic. The  patient was then placed on  bypass. The coronaries were identified for  grafting. The saphenous veins were cut the appropriate lengths and prepared  for the distal anastomoses. Cardioplegia catheters were placed for both  antegrade aortic and retrograde coronary sinus cardioplegia. The patient was  cooled to 30 degrees.  The aortic crossclamp was applied. 800 cc of cold  blood cardioplegia was delivered in split doses between the antegrade aortic  and retrograde coronary sinus catheters. There is good cardioplegic arrest  and septal temperature dropped less than 10 degrees. Topical iced saline was  used to augment myocardial preservation and a pericardial insulator pad was  used the protect left phrenic nerve.   The distal coronary anastomoses were performed.  The first distal  anastomosis was the distal third of the LAD. This is a 1.7 mm vessel with  proximal 90% stenosis.  A reverse saphenous vein was sewn end-to-side with  running 7-0 Prolene with good flow through graft. The second distal  anastomosis was to the diagonal. This is a smaller 1.5 mm vessel with  proximal 90% stenosis. Reverse saphenous vein was sewn end-to-side with  running 7-0 Prolene with good flow through graft. The third distal  anastomosis was to the distal circumflex OM2, which had occluded proximally.  This is a 1.5 mm vessel and a reverse saphenous vein was sewn end-to-side  with running 7-0 Prolene.  There was good flow through the graft.  Cardioplegia was redosed.   While the crossclamp was still in place, three proximal vein anastomoses  were placed on the ascending aorta with a 4.0 mm punch with running 6-0  Prolene. Prior to release of the crossclamp, air was vented from the  coronaries and the left side of the heart using a dose of retrograde warm  blood cardioplegia and de-airing maneuvers on bypass by filling the heart.   The crossclamp was removed. Air was aspirated from the vein grafts and grafts were opened and each had good  flow with hemostasis documented at the  proximal and distal sites. The patient was cardioverted back to a regular  rhythm. The patient was rewarmed to 37 degrees. Temporary pacing wires were  applied. Lungs re-expanded and the ventilator was resumed. The patient was  weaned from bypass after he was warmed and stable. Cardiac output and blood  pressure were stable. Cannulas were removed. Protamine was administered  without adverse reaction. The mediastinum was irrigated with warm antibiotic  irrigation. Leg incision was irrigated and closed in a standard fashion. The  superior pericardial fat was closed over the aorta and vein grafts. Two  mediastinal and a left pleural chest tube were placed and brought out  through separate incisions. The sternum was  closed with interrupted steel  wire. The pectoralis fascia was closed with a running #1 Vicryl and the  subcutaneous and skin were closed with a running Vicryl. Sterile dressings  were applied. Total bypass time was 95 minutes with crossclamp time was 60  minutes.      PV/MEDQ  D:  12/31/2004  T:  12/31/2004  Job:  [id number]   cc:   [facility]   CVTS office

## 2011-02-12 NOTE — Discharge Summary (Signed)
NAME:  [name], [name] NO.:  [id number]   MEDICAL RECORD NO.:  [id number]          PATIENT TYPE:  INP   LOCATION:  2014                         FACILITY:  [facility]   PHYSICIAN:  Kerin Perna, M.D.  DATE OF BIRTH:  [birth year]   DATE OF ADMISSION:  12/23/2004  DATE OF DISCHARGE:  01/05/2005                                 DISCHARGE SUMMARY   ADMISSION DIAGNOSIS:  Shortness of breath.   DISCHARGE DIAGNOSES:  1.  Hypertension.  2.  Type 2 diabetes mellitus.  3.  Unstable angina.  4.  Right pleural effusion, status post thoracentesis.  5.  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  6.  Severe two-vessel coronary artery disease, status post subendocardial      myocardial infarction with ejection fraction of 45% and status post      coronary artery bypass grafting x3.  7.  Acute blood loss anemia, status post transfusion, stable.  8.  Status post appendectomy.   ALLERGIES:  No known drug allergies.   HISTORY OF PRESENT ILLNESS:  The patient is a 77 year old, African-American  male who has been diagnosed with severe two-vessel coronary artery disease  in the past.  The patient underwent cardiac catheterization in 2004, which  demonstrated a moderate disease with a 70% stenosis of the circumflex  marginal and a distal 80% stenosis of the LAD with insignificant disease of  the right coronary and a well-preserved left ventricular function.  Medical  therapy was recommended and the patient had done well.  Approximately 4-5  days prior to admission, the patient began to develop shortness of breath  with exertion and some nonproductive cough with right-sided pleuritic pain  which began to increase in severity and frequency.  Pain persisted through  the night of December 23, 2004, and therefore the  patient presented to his  primary care physician the next day.  The patient also complained of some  orthopnea.  He denied fever, hemoptysis, nausea, vomiting and diaphoresis.  The patient was subsequently referred to the emergency room for further  workup.  At that time, his cardiac enzymes were checked and they were mildly  elevated.  Secondary to this, the patient was admitted for observation and  additional workup.   HOSPITAL COURSE:  The patient was admitted via the emergency room on December 23, 2004, as previously stated.  The patient's cardiac enzymes were mildly  elevated.  Secondary to the patient's symptoms, a CT scan was performed to  rule out pulmonary embolus, however, it did demonstrate a mediastinal  subcarinal mass of 3.5 cm and paratracheal adenopathy.  There was no  discrete pulmonary lesion found.  The patient also had a fairly large  pleural effusion which was aspirated via thoracentesis.  Cytologies were  performed on this fluid and this revealed reactive mesothelial cells and  inflammation.   Secondary to the patient's elevated cardiac enzymes, a repeat  heart  catheterization was performed on December 25, 2004.  This revealed severe two-  vessel coronary artery disease and a left ventricular ejection fraction of  45-50% with mild global hypokinesis.  Dr. Kathlee Nations Trigt of [facility] was  subsequently consulted regarding surgical revascularization.  Dr. Donata Clay  evaluated the patient on December 26, 2004, and it was his opinion that the  patient should undergo a PET scan secondary to the subcarinal mass and  paratracheal adenopathy.  If this PET scan was negative, the patient would  be a candidate for surgical revascularization.  However, if the PET scan was  positive, then a subcarinal biopsy or mediastinoscopy and biopsy would be  indicated to establish diagnosis.  The PET scan was subsequently scheduled  and was performed on December 28, 2004.  The PET scan was negative and   therefore, coronary artery bypass grafting surgery was subsequently  scheduled.   The patient remained in stable condition and was maintained on routine  hospital care prior to surgery.  The patient was taken to the OR on December 31, 2004, for coronary artery bypass grafting x3.  The saphenous vein was  grafted to the LAD, saphenous vein was grafted to the diagonal and saphenous  vein was grafted to the OM-1.  Endoscopic vessel harvesting was performed on  the right lower extremity.  A biopsy was taken of the paratracheal lymph  node.  This was subsequently found to be negative.  The patient tolerated  the procedure well and was hemodynamically stable immediately  postoperatively.  The patient was transferred from the OR to the SICU in  stable condition.  The patient was extubated without complication and woke  up from anesthesia neurologically intact.   The patient's postoperative course progressed as expected.  He has been  volume overloaded postoperatively and has been diuresed accordingly.  The  patient's chest tubes and lines were discontinued in a routine manner  without complication.  The patient's drips were also discontinued in a  routine manner.  He began cardiac rehabilitation on postop day #1, and has  increased his tolerance to a satisfactory level at this time.   The patient's diabetes mellitus has been controlled with sliding scale  insulin postoperatively.  He has also been restarted on his preoperative  p.o. medications of Glucophage and Glucotrol XL.  He was noted to be anemic  and was transfused with 1 unit of packed RBCs on postop day #5.  Also, on  postop day #5, the patient was without complaint.  His bowel function had  returned and his vital signs were stable.  He was afebrile.  The patient was  maintaining normal sinus rhythm.   DISCHARGE PHYSICAL EXAMINATION:  GENERAL:  Incisions are clean and dry. CARDIAC:  Regular rate and rhythm.  LUNGS:  Faint crackles in  the bases.  EXTREMITIES:  There is mild lower extremity edema present.   CONDITION ON DISCHARGE:  Improved.  The patient is in stable condition at  this time and as long as he continues to progress in the current manner, he  will be ready for discharge within the next 1-2 days pending morning round  reevaluation.   LABORATORY DATA AND X-RAY FINDINGS:  CBC and BMP on January 05, 2005, with  white count 7.1, hemoglobin 7.9, hematocrit 23.3, platelets 334.  Sodium  139, potassium 4.5, BUN 34, creatinine 1.4, glucose 95.   DISCHARGE MEDICATIONS:  1.  Aspirin 325 mg q.d.  2.  Toprol XL 25  mg q.d.  3.  Lipitor 20 mg q.d.  4.  Glucotrol XL 10 mg q.d.  5.  Glucophage 1000 mg b.i.d.  6.  Folic acid 1 mg q.d.  7.  Lasix 40 mg q.d. x7 days.  8.  K-Dur 20 mEq q.d. x7 days.  9.  Iron 325 mg q.d.  10. Tylox one to two p.o. q.4-6h. p.r.n. pain.   ACTIVITY:  No driving and no lifting more than 10 pounds.  The patient is to  continue daily breathing and walking exercises.   DIET:  Low salt, low fat, low carbohydrate modified diet.   WOUND CARE:  The patient may shower daily and clean the incisions with soap  and water.  If wound problems arise, he should contact the [facility] office at  [phone].   FOLLOW UP:  Follow-up appointment with Dr. [name] on Jan 27, 2005, at 2:15  p.m.  A chest x-ray will be taken at the time of that appointment which the  patient will be instructed to bring with him to the follow-up appointment  with Dr. Donata Clay.  Dr. Donata Clay on Jan 29, 2005, at 1:30 p.m.      AY/MEDQ  D:  01/05/2005  T:  01/05/2005  Job:  [id number]   cc:   Olga Millers, M.D. [facility]

## 2011-02-12 NOTE — Assessment & Plan Note (Signed)
[facility]                            CARDIOLOGY OFFICE NOTE   [name], [name]                     MRN:          [id number]  DATE:10/18/2006                            DOB:          01/26/1931    This is a 77 year old African-American male patient with a history of  coronary artery disease, status post CABG in 2006 and has mild LV  dysfunction.  He presents today with increased shortness of breath and  edema.   On reviewing his medicines, it turns out he has ran out of all of his  blood pressure and cardiac medicines two weeks ago.  He had taken his  prescriptions to [facility] to be filled and decided he did not want to go  back there, and they told him he had no refills, so he just stopped  taking his medications.   He has had some occasional sharp, shooting chest pains but no chest  heaviness.  This occurs if he turns a certain way.  It does not hurt  when he takes a deep breath.   CURRENT MEDICATIONS:  1. Lipitor 40 mg daily.  2. Folic acid 1 mg daily.  3. Glipizide 10 mg daily.  4. Coated aspirin 325 mg daily.  5. Zetia 10 mg daily.  6. Metformin 1000 mg b.i.d.  7. He ran out of his Toprol XL 100 mg daily, furosemide 40 mg daily,      Cozaar 50 mg daily, and potassium 20 mEq daily two weeks ago.   PHYSICAL EXAMINATION:  GENERAL:  This is a pleasant 77 year old African-  American male in no acute distress.  VITAL SIGNS:  Blood pressure 166/97, pulse 80. Weight 194.  NECK:  Increased JVD and HJR.  LUNGS:  Bibasilar rales.  HEART:  Regular rate and rhythm at 80 beats per minute, normal S1 and  S2, positive S4, a 1/6 systolic murmur at the left sternal border.  ABDOMEN:  Soft without organomegaly, masses, lesions, or abnormal  tenderness.  EXTREMITIES:  Bilateral ankle edema +1-2.  Positive distal pulses.   IMPRESSION:  1. Congestive heart failure and fluid overload secondary to stopping      his medication two weeks ago.  2.  Coronary artery disease, status post coronary artery bypass graft      x2 in April, 2006.  3. Mild left ventricular dysfunction  4. Hypertension, uncontrolled, due to stopping medications.  5. Diabetes mellitus.  6. Hyperlipidemia.  7. History of mediastinal adenopathy on CT in November, 2007 by Dr.      Debby Bud, needs followup.  8. Anemia.   PLAN:  I have given the patient prescriptions for the cardiac  medications that he ran out of.  I have stressed to him the importance  of not stopping these.  I will not perform a chest x-ray or CT of his  chest today, as he is in heart failure, and I asked him to follow up  with Dr. Debby Bud concerning his adenopathy.  This is his second episode  of heart failure since November, mostly related to hypertension, but we  will evaluate him  for ischemia at the end of the month when this is  resolved, and he will follow up with Dr. [name] after he has a stress  Myoview.      Jacolyn Reedy, PA-C  Electronically Signed      Cecil Cranker, MD, [facility]  Electronically Signed   ML/MedQ  DD: 10/18/2006  DT: 10/18/2006  Job #: [id number]   cc:   Rosalyn Gess. Norins, MD  Madolyn Frieze. [name], MD, [facility]

## 2011-02-12 NOTE — Assessment & Plan Note (Signed)
[facility]                              CARDIOLOGY OFFICE NOTE   ZERIC, BARANOWSKI                     MRN:          [id number]  DATE:05/05/2006                            DOB:          1931/03/29    PRIMARY CARDIOLOGIST:  Dr. Olga Millers   REASON FOR VISIT:  Recent chest discomfort.   HISTORY OF PRESENT ILLNESS:  [name] is a pleasant gentleman  followed by Dr. [name] with a history of coronary artery disease status  post coronary artery bypass grafting in April 2006 with a saphenous vein  graft to left anterior descending, saphenous vein graft to the diagonal, and  saphenous vein graft to the first obtuse marginal.  Additional history  includes hypertension, type 2 diabetes mellitus, and apparently history of a  subcarinal mass noted on a previous PET scan in 2006.  Based on chart  review, my understanding is that this is being followed by Dr. Debby Bud.  He  states that over the last week he has had a discomfort in his chest that is  usually noted when he is eating and after he swallows.  He feels a lump,  predominantly on the left lower anterior aspect of his thorax, although  sometimes up towards the midline.  This discomfort is nonexertional in  character and, in fact, he states that he has been working quite a bit  outdoors recently with no angina.  His electrocardiogram today is stable  showing sinus rhythm with left anterior fascicular block and left  ventricular hypertrophy with nonspecific ST changes.  He otherwise has  experienced a cough recently that has been mildly productive but has had no  obvious fevers or chills.  He denies having any hemoptysis.  In reviewing  his medications, these have been stable although he has not taken Protonix  with any regularity.   ALLERGIES:  No known drug allergies.   PRESENT MEDICATIONS:  1. Hydrochlorothiazide 12.5 mg p.o. daily.  2. Toprol-XL 100 mg p.o. daily.  3. Lipitor  40 mg p.o. daily.  4. Folic acid 1 mg p.o. daily.  5. Glipizide ER 10 mg p.o. daily.  6. Enteric-coated aspirin 325 mg p.o. daily.  7. Glucophage 1000 mg p.o. b.i.d.  8. Enalapril 20 mg p.o. daily.  9. Diclofenac 500 mg p.o. b.i.d.  10.Lasix 20 mg p.o. daily.  11.Zetia 10 mg p.o. daily.   REVIEW OF SYSTEMS:  As in history of present illness.   EXAMINATION:  VITAL SIGNS:  Blood pressure today is 150/80, heart rate is  80, weight is 197 pounds.  GENERAL:  The patient is comfortable and in no acute distress, denying any  active chest pain or labored breathing.  NECK:  Examination of the neck reveals no elevated jugular venous pressure  or loud bruits.  LUNGS:  Are clear with somewhat coarse breath sounds but no obvious egophony  or rales are noted.  No expiratory wheezing noted.  CARDIAC:  Reveals a regular rate and rhythm without loud murmur or S3  gallop.  The chest wall is stable status post median sternotomy.  No  focal  point tenderness is noted.  EXTREMITIES:  Show no significant pitting edema.   IMPRESSION/RECOMMENDATION:  1. Recent episodes of chest pain that seem possibly gastrointestinal in      etiology and are unlikely to be ischemic, based on description.  His      electrocardiogram is stable.  He is also reporting a cough recently.  I      have asked him to start taking his Protonix regularly for the time      being and will also refer him for a PA and lateral chest x-ray.  I am a      bit concerned about the history I see of a previous subcarinal mass and      do note a CT scan of the chest from August 2006 demonstrating      mediastinal adenopathy that was unchanged from previous evaluations      without any frank lung mass on that particular study.  It was      recommended that a CT scan be performed 3-4 months down the road and      this would be a consideration as well, particularly if his symptoms      persist.  He will plan to see Dr. [name] back in the office  over the      next few weeks.  2. Further plans to follow.                                Jonelle Sidle, MD    SGM/MedQ  DD:  05/05/2006  DT:  05/05/2006  Job #:  [id number]   cc:   Madolyn Frieze. [name], MD, [facility]

## 2011-02-12 NOTE — Cardiovascular Report (Signed)
NAME:  [name], HOLZHAUER NO.:  [id number]   MEDICAL RECORD NO.:  [id number]          PATIENT TYPE:  INP   LOCATION:  2904                         FACILITY:  [facility]   PHYSICIAN:  Rollene Rotunda, M.D.   DATE OF BIRTH:  [birth year]   DATE OF PROCEDURE:  12/25/2004  DATE OF DISCHARGE:                              CARDIAC CATHETERIZATION   PHYSICIANS:  1.  Thomos Lemons, D.O., primary.  2.  Doylene Canning. Ladona Ridgel, M.D., cardiologist.   PROCEDURE:  1.  Left heart catheterization.  2.  Coronary arteriography.   INDICATIONS:  Evaluate the patient with non-Q-wave myocardial infarction.   DESCRIPTION OF PROCEDURE:  Left heart catheterization was performed via the  right femoral artery. The artery was cannulated using anterior wall  puncture. A 6-French arterial sheath was inserted via the modified Seldinger  technique. A preformed Judkins pigtail catheter was utilized. The patient  tolerated the procedure well and left the lab in stable condition.   HEMODYNAMIC DATA:  1.  LV 145/15.  2.  AL 145/75.   CORONARY ARTERIOGRAPHY:  1.  Left main was normal.  2.  The LAD had severe diffuse disease. There was proximal 99% stenosis.      There was a long mid-80-90% stenosis. There was an apical 99% lesion.      There were two small diagonals. The flow in the LAD was TIMI II.  3.  The circumflex had proximal 25% stenosis.  4.  The OM1 was small and normal.  5.  The OM2 was moderate size with subtotal stenosis and TIMI I flow.  6.  The right coronary artery was a dominant vessel. There were diffuse      luminal irregularities and a proximal 25% stenosis.  7.  The PDA was moderate size and normal. There were two posterolaterals      which were moderate size and normal.   LEFT VENTRICULOGRAM:  The left ventriculogram was obtained in the RAO  projection. The EF was approximately 45-50% with mild global hypokinesis.   CONCLUSION:  1.  Severe two vessel coronary artery disease.  2.  Mild  left ventricular dysfunction.   PLAN:  We will consult CVTS to evaluate for CABG. Also, the patient has a  subcarinal mass which has the appearance of possible neoplasm. We will have  cardiothoracic surgeons evaluate this as well to decide on a global plan for  these two problems.      JH/MEDQ  D:  12/25/2004  T:  12/26/2004  Job:  [id number]

## 2011-02-12 NOTE — Discharge Summary (Signed)
NAME:  ESTES, LEHNER NO.:  [id number]   MEDICAL RECORD NO.:  [id number]          PATIENT TYPE:  INP   LOCATION:  4735                         FACILITY:  [facility]   PHYSICIAN:  Madolyn Frieze. [name], MD, FACCDATE OF BIRTH:  04-06-1931   DATE OF ADMISSION:  10/21/2006  DATE OF DISCHARGE:  10/26/2006                               DISCHARGE SUMMARY   PRIMARY CARDIOLOGIST:  Dr. Olga Millers.   PRIMARY CARE PHYSICIAN:  Is not listed.   PROCEDURES PERFORMED DURING HOSPITALIZATION:  None.   PRIMARY DIAGNOSES:  1. Acute on chronic diastolic congestive heart failure with pulmonary      edema.  2. Bronchitis.   SECONDARY DIAGNOSES:  1. Coronary artery disease, status post coronary artery bypass      grafting in 2006.      a.     Saphenous vein graft to left anterior descending, saphenous       vein graft to diagonal 1, saphenous vein graft to OM2.  2. Hypertension.  3. Diabetes.  4. Hyperlipidemia.  5. Anemia.   HISTORY OF PRESENT ILLNESS:  This is a 77 year old African American male  with history of coronary artery disease and coronary artery bypass graft  who was transferred to [facility] on October 21, 2006  after been seen at [facility] for shortness of breath and  dyspnea on exertion.  The patient had been seen by Dr. Corinda Gubler on  October 18, 2006 prior to admission because he had run out of his  Toprol, Lasix, Cozaar and potassium for approximately 2 weeks and had  developed increased edema and shortness of breath.  The patient did  resume his medications after being seen by Dr. Corinda Gubler; however, he  continued to have some increasing orthopnea and edema.  The patient was  given IV Lasix and transferred to [facility] from [place].   HOSPITAL COURSE:  The patient was seen and examined by Dr. Earlham Bing on admission and IV diuretics were continued.  The patient was  followed by Dr. Olga Millers throughout  hospitalization.  IV Lasix  continued throughout hospitalization until day prior to discharge on  October 25, 2006 and was restarted on Lasix 40 p.o. daily.  The patient  also was found to have some bronchitis and started on Zithromax 250 mg  p.o. daily.  He was noted to have an elevated D-dimer, which was  nonspecific and a VQ scan was completed, revealing negative for  pulmonary embolus.  The patient diuresed with IV Lasix, approximately 4  liters.  Patient's weight was 190.5 on discharge.   The patient was seen and examined by Dr. Olga Millers on date of  discharge and was found to be stable to go home.   DISCHARGE LABS:  Sodium 139, potassium 4.2, chloride 105, CO2 26, BUN  30, creatinine 1.42, glucose 214.  Hemoglobin 10.4, hematocrit 31.8,  white blood cell is 5.8, platelets 195.  Patient's weight 190.5.   VITAL SIGNS:  Blood pressure 145/77.  Heart rate 70.  Respirations 20.  Temperature 97.9.   FOLLOWUP APPOINTMENTS AND  PLANS:  1. The patient is scheduled for an outpatient Myoview stress test      dated February 4 at 8:30 a.m. at [facility].  He has been      advised n.p.o. status prior to stress test, but he is to take his      antihypertensive medications.  2. The patient is scheduled for a BMET the same day as stress test,      which will be completed prior to having his stress test with      results to Dr. [name].  3. The patient is to follow up with Dr. [name] on February 8 at 9:15      a.m. on a previously scheduled appointment.  4. The patient is to continue his Zithromax 250 mg a day until      October 28, 2006.  5. The patient was counseled on medical compliance.  6. The patient is to follow up with his primary care physician      concerning anemia status.   DISCHARGE MEDICATIONS:  1. Lipitor 40 mg at bedtime.  2. Folic acid 1 mg daily.  3. Glipizide 10 mg daily.  4. Aspirin 325 daily.  5. Zetia 10 mg daily.  6. Toprol-XL 100 mg daily.  7.  Cozaar 50 mg daily.  8. Lasix 40 mg daily.  9. Zithromax 250 mg daily until October 28, 2006.  10.Patient was noted to be on potassium prior to admission, potassium      was not ordered on discharge.  Followup BMET will be completed on      February 4 with advisement at that time.   Time spent with the patient, to include physician time, 35 minutes.      Bettey Mare. Lyman Bishop, NP      Madolyn Frieze. [name], MD, [facility]  Electronically Signed    KML/MEDQ  D:  10/26/2006  T:  10/26/2006  Job:  [id number]

## 2011-02-12 NOTE — H&P (Signed)
NAME:  [name], PUFF NO.:  [id number]   MEDICAL RECORD NO.:  [id number]          PATIENT TYPE:  INP   LOCATION:                               FACILITY:  [facility]   PHYSICIAN:  Thomos Lemons, D.O. [facility]   DATE OF BIRTH:  09/09/1931   DATE OF ADMISSION:  DATE OF DISCHARGE:                                HISTORY & PHYSICAL   CHIEF COMPLAINT:  Chest pain and rib pain both sides.   HISTORY OF PRESENT ILLNESS:  The patient is a 77 year old African American  male with a past medical history of type 2 diabetes, hypertension, and  coronary artery disease who comes to see me today regarding pain in his  sternum associated with a cough that started three days ago.  The patient  states that the coughing and the chest discomfort have worsened over the  last 24 hours.  The patient states that even prior to his coughing episode  the patient experienced chest pain near his sternal area that was severe.  The patient rated it 10 out of 10.  The patient states that that pain was  there pretty much all night last night.  The patient also had significant  cough with pain in his right and left flank that was associated with  coughing.  The patient also thinks he has been wheezing somewhat and feels  short of breath.  The patient denies any recent fevers, no recent upper  respiratory infection, or upper respiratory type symptoms.  The patient  denied any worsening of pain in the sternal area with deep inspiration,  although the pain in his flank at this time does worsen with inspiration.  The patient states that when he initially experienced the discomfort, he was  in bed two days ago.   PAST MEDICAL HISTORY:  1.  Type 2 diabetes that is somewhat controlled with his last hemoglobin A1c      of 7.4 in May 2005.  2.  Coronary artery disease.  Last cardiac cath in March 2004 by Dr. Ladona Ridgel      which showed two-vessel coronary artery disease with 70% large obtuse      marginal branch stenosis,  and 80% apical left anterior descending      stenosis, along with a 60% mid left anterior descending stenosis just      prior to the first diagonal branch.  Overall LV function was preserved.  3.  A remote history of tobacco abuse.   FAMILY HISTORY:  Significant for coronary artery disease.  Father died at  age 74 of old age.  Mother also died at age 52 from old age.   SOCIAL HISTORY:  He is widowed.  He quit smoking in 1955.  Denies any  alcohol use.   CURRENT MEDICATIONS:  1.  Toprol XL 25 mg once a day.  2.  Glucophage 1,000 mg p.o. b.i.d.  3.  Glucotrol XL 10 mg a.c. a.m. meal.  4.  Vasotec 10 mg once a day.  5.  Lasix 40 mg once a day.   REVIEW OF SYSTEMS:  No fevers, chills,  weight gain, weight loss, no HEENT  symptoms.  Cardiovascular symptoms as described above.  The patient also  notes some orthopnea.  Respiratory symptoms as described above.  No nausea,  vomiting, constipation, diarrhea.  No dysuria, frequency, urgency, and all  other systems negative.   PHYSICAL EXAMINATION:  VITAL SIGNS:  Weight was at 214 pounds, temperature  is 98.2, pulse is 110, and blood pressure was 135/77 in the left arm in a  seated position.  GENERAL:  The patient is a pleasant 77 year old African American male in no  apparent distress.  HEENT:  Pupils were equal reactive to light bilaterally.  Extraocular  motility was intact.  The patient's conjunctivae were within normal limits.  Oropharynx and tympanic membranes were clear bilaterally.  NECK:  Did not reveal any carotid bruit or thyromegaly.  Did not appreciate  JVD.  RESPIRATORY:  Chest was essentially clear.  No evidence of wheezing,  although the patient did have some coarse breath sounds anteriorly.  CARDIOVASCULAR:  Regular rate and rhythm.  No significant murmurs, rubs, or  gallops appreciated.  The patient had diminished PD pulses bilaterally.  ABDOMEN:  Soft nontender with positive bowel sounds.  I could not appreciate   hepatosplenomegaly.  MUSCULOSKELETAL:  No clubbing, cyanosis, or edema.  SKIN:  Warm and dry without any ulcers.  NEUROLOGIC:  The patient was awake, alert, and oriented x 3.  The patient's  affect was within normal limits.   An EKG was performed in the office which showed sinus tachycardia at 105  beats per minute.  The patient had normal axis but did have some minor ST  changes with possible ST elevation in V2 isolated as well as inverted T  waves, nonspecific ST changes and in the lateral leads.  An O2 sat was also  checked in the office which showed that it was 89% on room air.   IMPRESSION:  1.  Chest pain with hypoxia.  2.  Type 2 diabetes.  3.  History of coronary artery disease.  4.  Hypertension.   RECOMMENDATIONS:  1.  The patient's chest pain may be due to acute myocardial ischemia, other      diagnostic possibilities include PE and also possible pneumonia.  The      patient will be admitted for further observation.  2.  We will get cardiac enzymes, and we will consult cardiology.  3.  He will have a chest x-ray performed, as well as get a dose of aspirin,      as well as one dose of Lovenox.  4.  I will get a CAT scan of the chest in the a.m. with PE protocol due to      the fact that he has been on Glucophage 1,000 mg b.i.d.  We will      currently hold that dose overnight and obtain study in the morning.      RY/MEDQ  D:  12/23/2004  T:  12/23/2004  Job:  [id number]   cc:   Rosalyn Gess. Norins, M.D. [facility]

## 2011-02-12 NOTE — Discharge Summary (Signed)
NAME:  [name], [name] NO.:  [id number]   MEDICAL RECORD NO.:  [id number]          PATIENT TYPE:  INP   LOCATION:  2014                         FACILITY:  [facility]   PHYSICIAN:  Kerin Perna, M.D.  DATE OF BIRTH:  April 19, 1931   DATE OF ADMISSION:  12/23/2004  DATE OF DISCHARGE:  01/11/2005                                 DISCHARGE SUMMARY   ADDENDUM:  [name] was originally slated for discharge home on January 06, 2005.  However, on that morning he developed nausea and diarrhea.  He was  kept in the hospital for further evaluation.  He was treated conservatively  with discontinuation of laxatives and stool softeners and his GI symptoms  slowly resolved.  He also developed fever up to 101.2.  Urinalysis performed  which was negative.  He had a chest x-ray which showed a mild right pleural  effusion with atelectasis.  His white blood cell count remained stable at  10.1.  He had no evidence on physical exam for acute infection.  He was  treated with aggressive pulmonary toilet measures and his fever resolved.  He was also noted to have decrease in his hemoglobin and hematocrit to 8.2  and 24.6 respectively.  He was continued on iron supplementation as well as  folic acid and was transfused a unit of packed red blood cells.  His anemia  improved following his transfusion.  By postoperative day #11, he was  afebrile and all vital signs were stable.  He was maintaining normal sinus  rhythm.  He was having normal bowel and bladder function and was tolerating  a regular diet without difficulty.   His labs on that morning showed a hemoglobin of 9.1, hematocrit 28, white  count 9.2, platelets 505, sodium 138, potassium 4.4, BUN 28, creatinine 1.5.  He had no other  acute changes and appeared to be progressing well.  Therefore, it was felt that he could be discharged home.   His discharge instructions and medications are all unchanged from the  previously dictated discharge  summary.      GC/MEDQ  D:  01/11/2005  T:  01/11/2005  Job:  [id number]   cc:   Olga Millers, M.D. [facility]   Rosalyn Gess. Norins, M.D. [facility]

## 2011-02-12 NOTE — Consult Note (Signed)
NAME:  [name], [name] NO.:  [id number]   MEDICAL RECORD NO.:  [id number]          PATIENT TYPE:  INP   LOCATION:  4735                         FACILITY:  [facility]   PHYSICIAN:  Doylene Canning. Ladona Ridgel, M.D.  DATE OF BIRTH:  [birth year]   DATE OF CONSULTATION:  12/23/2004  DATE OF DISCHARGE:                                   CONSULTATION   REASON FOR CONSULTATION:  Evaluation of chest pain.   HISTORY OF PRESENT ILLNESS:  The patient is a 77 year old man whose health  has been good.  He has a history of  hypertension and diabetes.  He has a  one month history of reflux type symptoms and a sour stomach.  He notes  that he has had a swallowing test several weeks ago but does not know the  results.  He was in his usual state of health until 3-4 days ago.  Prior to  that, he was mowing grass and had no restrictions.  Approximately three days  ago, he developed shortness of breath and pleuritic chest pain.  He notes  that the pain in his chest in the center radiates to the right side of his  chest, is worse with lying down, better when sitting up.  There is no clear  relationship to exertion.  The patient does, however, note increasing  shortness of breath.  He denies arm pain, neck, or jaw pain.  Because of all  the above symptoms, he presented for additional evaluation.  He denies  fevers or chills.  He does have a cough which is nonproductive.   PAST MEDICAL HISTORY:  As previously noted.   FAMILY HISTORY:  Noncontributory.   SOCIAL HISTORY:  The patient denies tobacco and ethanol use.   REVIEW OF SYMPTOMS:  Negative for vision or hearing problems, night sweats,  polyuria, polydipsia, colon polyps, recent weight changes, hemoptysis or  frank syncope.  He denies nausea, vomiting, diarrhea, constipation.  He  denies head and cold intolerances.  The rest of the review of systems was  negative.   PHYSICAL EXAMINATION:  GENERAL:  Pleasant, well appearing 77 year old man in no  distress.  VITAL SIGNS:  Blood pressure initially 138/77, pulse 105 and regular,  respirations 20, temperature 98, weight 214 pounds.  HEENT:  Normocephalic, atraumatic, pupils equal and round, oropharynx moist,  sclerae anicteric.  NECK:  No clear jugular venous distention, there is no thyromegaly.  The  trachea was midline.  CARDIOVASCULAR EXAM:  Regular rate and rhythm with normal S1 and S2, there  was an S4 gallop present.  LUNGS:  Very minimal rales which were scattered.  There was no increased  work of breathing.  There was no egophony.  ABDOMEN:  Soft, nontender.  There is no organomegaly.  EXTREMITIES:  No cyanosis, clubbing, and edema.  Pulses were 2+ and  symmetric.  NEUROLOGICAL:  Alert and oriented x 2, cranial nerves intact, strength 5/5  and symmetric.   LABORATORY DATA:  EKG demonstrates sinus tachycardia with nonspecific T-wave  abnormality in the anterior septal region.   IMPRESSION:  1.  Three days of pleuritic  chest pain and shortness of breath accompanied      with cough without fevers and without exertional relationship.  2.  Hypertension.  3.  Diabetes.  4.  Remote history of tobacco use.   DISCUSSION:  Overall, [name] symptoms are somewhat peculiar.  His chest  pain is quite pleuritic in nature.  Pericarditis or pleuritis or pulmonary  embolism or pneumonia would be highest on the differential.  However, I  recommend that he have 2D echo in addition to the CT scan and cardiac  enzymes that have been ordered.  If his cardiac enzymes are normal and his  LV function is normal, then outpatient stress Myoview would be a  consideration.  In addition, a consideration of a trial of NSAIDs if his  other workup is negative.  Will also plan to review his chest CT.      GWT/MEDQ  D:  12/23/2004  T:  12/23/2004  Job:  [id number]   cc:   Rosalyn Gess. Norins, M.D. [facility]

## 2011-02-12 NOTE — H&P (Signed)
NAME:  [name], [name] NO.:  [id number]   MEDICAL RECORD NO.:  [id number]          PATIENT TYPE:  INP   LOCATION:  4735                         FACILITY:  [facility]   PHYSICIAN:  Reginia Forts, MD     DATE OF BIRTH:  [birth year]   DATE OF ADMISSION:  10/21/2006  DATE OF DISCHARGE:                              HISTORY & PHYSICAL   CHIEF COMPLAINT:  Shortness of breath.   [name] is a 77 year old African American male with a history of  coronary artery disease status post CABG who presents as a transfer from  [facility] with shortness of breath and dyspnea on exertion.  The patient was recently in clinic with Dr. Corinda Gubler on the October 18, 2006.  At that time, he had run out of his Toprol XL, Lasix, Cozaar and  potassium for approximately two weeks.  He developed edema and increased  shortness of breath.  He was placed back on his medications to help  improve his blood pressure and minimize edema; however, over the course  of the next two days, he continued to have increasing orthopnea and  edema.  He subsequently presented to [facility] and was  found to have mild pulmonary edema and elevated blood pressure.  He was  subsequently transferred to [facility] after receiving IV Lasix  with symptomatic relief.  The patient notes chronic mild pleuritic chest  pain but denies any active chest pressure.  Denies any syncope.   PAST MEDICAL HISTORY:  1. Coronary artery disease, status post CABG in April of 2006 with an      SVG to LAD, SVG to a first diagonal and SVG to an OM2.  2. Hypertension.  3. Diabetes mellitus.  4. Hyperlipidemia.  5. History of mediastinal adenopathy.  6. History of anemia.  7. Status post appendectomy.  8. Diastolic dysfunction with a recent echo in December 2007      demonstrating normal ejection fraction of 60% and LVH.   ALLERGIES:  No known drug allergies.   MEDICATIONS:  1. Lipitor 40 mg p.o. daily.  2. Folic acid 1 mg p.o. daily.  3. Glipizide 10 mg p.o. daily.  4. Aspirin 325 mg p.o. daily.  5. Zetia 10 mg p.o. daily.  6. Metformin 1000 mg twice a day.  7. Toprol XL 100 mg daily.  8. Furosemide 40 mg a day.  9. Cozaar 50 mg a day.  10.Potassium chloride 20 mEq a day.   SOCIAL HISTORY:  The patient lives in [place].  He is widowed.  He  quit tobacco use in 1955.   FAMILY HISTORY:  Notable for both mother and father expiring at age 73  secondary to old age.   REVIEW OF SYSTEMS:  Notable for chronic pleuritic chest pain.  The rest  of the review of systems are reviewed and negative.   PHYSICAL EXAMINATION:  Temperature is 98.3, pulse is 63, respiratory  rate 18, blood pressure 157/76, sating 99% on two liters.  GENERAL:  The patient is awake, alert and oriented x3 in no acute  distress.  HEENT:  Normocephalic and atraumatic.  Pupils are equal, round, and  reactive to light.  Extraocular movements intact.  Neck shows trace JVD,  no bruits.  CARDIOVASCULAR:  Regular rhythm, normal rate.  No murmur, rub, or  gallop.  LUNGS:  Clear to auscultation except for trace bibasilar crackles.  ABDOMEN:  Positive bowel sounds, soft, nontender, and nondistended.  EXTREMITIES:  No clubbing or cyanosis.  There is trace bilateral lower  extremity edema.  NEUROLOGICAL:  Cranial nerves II through XII grossly.  No focal  musculoskeletal or sensory deficits.  MUSCULOSKELETAL:  Demonstrates no joint deformities or effusion.  PSYCH:  Demonstrates normal affect.   Chest x-ray demonstrates mild pulmonary edema and bilateral effusions.  EKG demonstrates normal sinus rhythm with a heart rate of 85.  No frank  ST or T-wave changes, borderline findings for LVH.  Labs demonstrate a  white count of 7.2, platelet count of 222,000.  Potassium of 4.6, BUN  22, creatinine of 1.2, BNP is 841.  Troponin is less than 0.05.  CK is  290 and MB is 9.5.   ASSESSMENT:  This is a 77 year old African American  male with coronary  artery disease, status post coronary artery bypass graft, medical  noncompliance now with pulmonary edema and effusions most likely due to  exacerbation of blood pressure and diastolic dysfunction.   PLAN:  1. Pulmonary:  The patient will be continued on Lasix 40 mg IV b.i.d.      and ruled out for myocardial infarction.  2. Hypertension:  The patient's blood pressure will be monitored while      he is on his current medications.  If his blood pressure remains      elevated, we will consider adding hydralazine and nitrate to help      with blood pressure.  3. Diabetes:  The patient will be continued on Glipizide.  Metformin      will be held for the time being.  Sliding scale insulin will be      initiated.      Reginia Forts, MD  Electronically Signed     RA/MEDQ  D:  10/22/2006  T:  10/22/2006  Job:  [id number]

## 2011-02-12 NOTE — Assessment & Plan Note (Signed)
[facility]                            CARDIOLOGY OFFICE NOTE   DAIRE, OKIMOTO                     MRN:          [id number]  DATE:11/04/2006                            DOB:          09/24/1931    Mr. Nitschke returns for followup today.  He is a gentleman who has a  history of coronary artery disease, status post coronary artery bypass  graft.  He was recently admitted to [facility] with shortness  of breath.  Note, he had run out of his medications, including his  Lasix.  The patient was treated with IV Lasix with significant  improvement in his symptoms.  He also received a course of Zithromax for  possible bronchitis.  He did have an elevated D-dimer, but a VQ scan  showed no pulmonary embolus.  Also note, the patient's last  echocardiogram was performed on September 05, 2006.  At that time he was  found to have normal LV function.  Since discharge, he is doing well.  He denies any dyspnea, orthopnea, PND or pedal edema.  Note, he does  occasionally have chest pain, which has been a chronic issue for him.  This increases with moving his arms.  It is not related to exertion.   MEDICATIONS:  1. Cozaar 50 mg p.o. daily.  2. Toprol 50 mg p.o. b.i.d.  3. Lasix 40 mg p.o. daily.  4. Lipitor 40 mg p.o. daily.  5. Potassium 20 mEq p.o. daily.  6. Metformin HCL 1000 mg p.o. b.i.d.  7. Glipizide XL 10 mg p.o. daily.  8. Zetia 10 mg p.o. daily.  9. Folate 1 mg p.o. daily.  10.Aspirin 325 mg p.o. daily.   PHYSICAL EXAMINATION:  Shows a blood pressure of 169/84, and his pulse  is 65.  He weighs 207 pounds.  NECK:  Supple.  CHEST:  Clear.  CARDIOVASCULAR EXAM:  Regular rate and rhythm.  There is a 2/6 systolic  ejection murmur at the left sternal border.  EXTREMITIES:  Show no edema.  Electrocardiogram shows a sinus rhythm with a 1st degree AV block.  There is a left anterior fascicular block.  There is left ventricular  hypertrophy.   There are nonspecific ST changes.   DIAGNOSES:  1. Coronary artery disease, status post coronary artery bypass graft.  2. Congestive heart failure secondary to diastolic dysfunction.  3. Preserved left ventricular function.  4. Hypertension.  5. Hyperlipidemia.  6. Diabetes mellitus.  7. History of mediastinal adenopathy followed by Dr. Debby Bud.  8. History of anemia followed by Dr. Debby Bud.   PLAN:  Mr. Beezley is doing well since he was discharged.  His blood  pressure is elevated today, and I have asked him to increase his Cozaar  to 100 mg p.o. daily.  We will check a BMET, lipids and liver in 1 week.  He will need followup carotid Dopplers for his history of mild cerebral  vascular disease.  We will also schedule him for a Myoview for risk  stratification, given his recent congestive heart failure and atypical  chest pain, which is most likely musculoskeletal (  I think his CHF is  secondary to diastolic dysfunction).  He will otherwise continue with  risk factor modification, and I will see him back in 6 months.  I have  asked him to follow up with Dr. Debby Bud concerning his history of  mediastinal adenopathy and anemia.     [name], MD, [facility]  Electronically Signed    BSC/MedQ  DD: 11/04/2006  DT: 11/04/2006  Job #: [id number]   cc:   Rosalyn Gess. Norins, MD

## 2011-02-12 NOTE — Consult Note (Signed)
NAME:  [name], [name] NO.:  [id number]   MEDICAL RECORD NO.:  [id number]          PATIENT TYPE:  INP   LOCATION:  2904                         FACILITY:  [facility]   PHYSICIAN:  Kathlee Nations Trigt III, M.D.DATE OF BIRTH:  1930-11-22   DATE OF CONSULTATION:  12/26/2004  DATE OF DISCHARGE:                                   CONSULTATION   REASON FOR CONSULTATION:  Severe two vessel coronary artery disease with  subendocardial MI.   CHIEF COMPLAINT:  Shortness of breath.   CURRENT PROBLEMS:  1.  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  2.  A 3.5 cm subcarinal mass with 2 cm right paratracheal adenopathy.  3.  Type 2 diabetes mellitus.  4.  Hypertension.  5.  Right pleural effusion, status post thoracentesis.  6.  Unstable angina.   HISTORY OF PRESENT ILLNESS:  I was asked to evaluate this 77 year old black  male for potential surgical coronary revascularization for recently  diagnosed severe two vessel coronary artery disease.  The patient has a  history of coronary disease and underwent cardiac catheterization in 2004  which demonstrated moderate disease with a 70% stenosis of the circumflex  marginal, distal 80% stenosis of the LAD and insignificant disease of the  right coronary with well preserved LV function.  Medical therapy was  recommended.  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  Approximately four to five days ago,  he started developing shortness of breath with exertion.  He then developed  some cough and right-sided pleuritic chest pain which was increasing in  severity and frequency.  The pain persisted through the night on December 23, 2004, and he presented for evaluation the next day.  He has had some  productive cough and some orthopnea.  He denies fever, hemoptysis or nausea,  vomiting and diaphoresis.   He was evaluated in the emergency room  where his cardiac enzymes were mildly  positive.  A CT scan was performed to rule out pulmonary embolus.  this was  negative for pulmonary embolus but demonstrated mediastinal subcarinal mass  and 3.5 cm with a paratracheal adenopathy.  There is no discrete pulmonary  lesion.  There is a fairly large pleural effusion which was aspirated with a  thoracentesis.  The cyt ologies on this fluid are pending.  The patient then  underwent a cardiac catheterization.  This demonstrated a 90% stenosis of  the proximal LAD with a subtotal 99% stenosis of the OM III with TIMI-1  flow.  The right coronary had mild disease.  There was a diagonal branch to  the LAD which also had moderate 70 to 80% stenosis.  His ejection fraction  was 40% and left ventricular end diastolic pressure was elevated at 30 mmHg.  Because of the patient's severe coronary disease, he is felt to be a  potential candidate for surgical revascularization.   PAST MEDICAL HISTORY:  1.  Type 2 diabetes mellitus.  Last hemoglobin A1c was 7.4 six  months ago.  2.  History of coronary disease.  3.  Remote history of smoking, quit in 1955.  4.  No known drug allergies.  5.  Hypertension.   HOME MEDICATIONS:  1.  Toprol XL 25 mg daily.  2.  Glucophage 1 g p.o. b.i.d.  3.  Glucotrol XL 10 mg q.a.m.  4.  Vasotec 10 mg p.o. daily.  5.  Aspirin 81 mg a day.  6.  Lasix 40 mg p.o. daily.   SOCIAL HISTORY:  The patient is widowed.  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  He remains quite active.  He does not use alcohol or tobacco.   FAMILY HISTORY:  Positive for diabetes and hypertension.  Negative for lung  cancer.   REVIEW OF SYMPTOMS:  No weight loss, fever or night sweats.  Mild change in  vision from his diabetes.  No active dental complaints and no difficulty  swallowing.  Chest review is negative for thoracic trauma, history of  previously abnormal chest x-ray.  Negative for hemoptysis.  Cardiac review  is positive for his  unstable angina and subendocardial MI with severe two  vessel coronary disease.  Abdominal review is positive for GERD, negative  for hepatitis, jaundice or blood per rectum.  Urologic review is negative  for BPH, kidney stones or prostate cancer.  Vascular review is negative for  DVT, claudication or TIA.  Hematologic review is negative for bleeding  disorder, prior blood transfusion.  Surgical review is negative for major  operations in the past other than appendectomy.  Neurologic review is  negative for seizure or stroke.   PHYSICAL EXAMINATION:  GENERAL APPEARANCE:  A pleasant, elderly black male  in his CCU room following cardiac catheterization in no distress.  VITAL SIGNS:  The patient is 5 foot 11, weight 203 pounds, blood pressure  140/80, pulse 84, temperature 98.2, oxygen saturation 95% on two liters.  HEENT:  Normocephalic, pupils equally reactive.  Dentition good.  Pharynx  clear.  NECK:  Without JVD, masses, or carotid bruit.  LYMPHATICS:  No palpable supraclavicular or axillary adenopathy.  LUNGS:  Some scattered rhonchi but no wheezing.  Breath sounds are equal  bilaterally.  CARDIOVASCULAR:  An S4 heart sound without S3 murmurs, rubs, or gallops.  ABDOMEN:  Obese without pulsatile mass, organomegaly or focal tenderness.  EXTREMITIES:  No clubbing, cyanosis, or edema.  Peripheral pulses are 2+ in  all extremities.  SKIN:  No diabetic ulceration of his feet.  He does have some atrophic skin  changes over the pretibial lower extremity areas bilaterally.  NEUROLOGIC:  Alert and oriented x3 without focal deficit.   LABORATORY DATA:  His coronary cath, chest CT and chest x-ray are reviewed.  His creatinine post catheterization is 1.3.  His hematocrit is 34 with a  normal white count.  Antibiotics which was started on admission had all been  stopped.  Carotid Dopplers are pending.  IMPRESSION AND PLAN:  This patient has significant coronary disease with a  subendocardial  MI and unstable angina.  He would benefit from surgical  revascularization.  However, prior to surgery, a PET scan will be needed to  assess for the malignant potential of his subcarinal mass.  If the PET scan  is positive for hypermetabolic activity, then a transcarinal biopsy or  mediastinoscopy and biopsy would be indicated to establish a diagnosis.  If  the patient has stage III lung cancer, then surgical therapy of his coronary  disease may not be  appropriate and consideration of percutaneous  intervention may be a better option.   We will proceed with a PET scan on Monday and then proceed in evaluation  according to those results.  This plan was discussed with the patient and  family and all questions addressed.   Thank you very much for the consultation.      PV/MEDQ  D:  12/26/2004  T:  12/27/2004  Job:  [id number]
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
## 2011-02-12 NOTE — Assessment & Plan Note (Signed)
[facility]                              CARDIOLOGY OFFICE NOTE   [name], [name]                     MRN:          [id number]  DATE:05/23/2006                            DOB:          [birth year]    [name] is a gentleman that I have seen in the past for coronary disease  status post coronary artery bypassing graft.  He was recently seen in the  office on May 05, 2006 by Dr. Diona Browner concerning atypical chest pain.  He  continues to have the pain, but it is somewhat improved.  It is in the upper  chest/neck area.  It increases with certain movements, but it is  nonexertional nor is it pleuritic in it does not relate to position.  It is  not related to food.  There is no dyspnea or pedal edema.  The medications  that he is taking at present include folate, glipizide XL 10 mg p.o. q.d.,  aspirin 325 mg p.o. daily, enalapril 20 mg p.o. q.d., Lasix 20 mg p.o. q.d.,  Zetia 10 mg p.o. q.d., metformin 1000 mg p.o. b.i.d.   PHYSICAL EXAMINATION:  VITAL SIGNS:  Blood pressure 130/78.  His pulse is  77.  He weighs 198 pounds.  CHEST:  Clear.  CARDIOVASCULAR:  Regular rate and rhythm.  EXTREMITIES:  No edema.   Electrocardiogram today shows a sinus rhythm at a rate of 77.  There is left  axis deviation as well as left ventricular hypertrophy.  There are no  significant ST changes noted.   DIAGNOSES:  1. Atypical chest pain.  2. Coronary artery disease status post coronary artery bypassing graft.  3. History of mildly reduced left ventricular function.  4. Hypertension.  5. Diabetes mellitus.  6. Hyperlipidemia.  7. History of mediastinal adenopathy.   PLAN:  [name] is doing well from a symptomatic standpoint.  His chest  pain sounds musculoskeletal, and we will not pursue further cardiac workup.  I had asked him to take Lipitor and Toprol previously, but he has not taken  those medications.  We will resume Lipitor at 40 mg p.o. q.d. and  check  lipids and liver in six weeks and adjust with a goal LDL of less than 70.  I  will also resume Toprol at 25 mg p.o. q.d.  He will otherwise continue with  risk factor modification.  He has apparently had mediastinal adenopathy on  previous CT that was followed by Dr. Debby Bud.  I have asked him to follow up  with Dr. Debby Bud concerning repeat study as this was advised on a study dated  April 27, 2005.  I do not have records concerning whether this was performed  or  not.  He did have a chest x-ray on May 05, 2006 that showed mild  cardiomegaly but no other abnormalities mentioned.  We will see him back in  six months.                              [name], MD, [facility]  BSC/MedQ  DD:  05/23/2006  DT:  05/24/2006  Job #:  [id number]

## 2011-02-12 NOTE — Cardiovascular Report (Signed)
NAME:  [name], [name]                        ACCOUNT NO.:  [id number]   MEDICAL RECORD NO.:  [id number]                   PATIENT TYPE:  INP   LOCATION:  4727                                 FACILITY:  [facility]   PHYSICIAN:  Doylene Canning. Ladona Ridgel, M.D. [facility]           DATE OF BIRTH:  11/10/1930   DATE OF PROCEDURE:  12/13/2002  DATE OF DISCHARGE:                              CARDIAC CATHETERIZATION   PROCEDURE:  Left heart catheterization with coronary angiography and left  ventriculography.   INDICATIONS:  Chest pain with an abnormal ECG.   INTRODUCTION:  The patient is a 77 year old man with diabetes and chest  pressure, which is fairly atypical, as well a shortness of breath, who was  admitted to the hospital with recurrent symptoms.  He also was noted to have  an abnormal ECG with LVH.  He had previously normal LV function by  catheterization with inferior hypokinesis.  He is now referred for left  heart catheterization.   DESCRIPTION OF PROCEDURE:  After informed consent was obtained, the patient  taken to the diagnostic catheterization lab in the fasted state.  After the  usual preparation and draping, the right femoral artery was punctured and  the left Judkins catheter was inserted into the right femoral artery and  advanced into the left main coronary artery.  Coronary angiography of the  left main system was carried out.  Following this, the left Judkins catheter  was removed and the right Judkins catheter was inserted into the right  coronary artery and coronary angiography of the right system carried out.  The right Judkins catheter was removed and the pigtail catheter was inserted  retrograde across the aortic valve and into the left ventricle.  Left  ventriculography in the RAO projection was then carried out.  The catheter  was then removed, hemostasis assured, and the patient returned to his room  in satisfactory condition.   COMPLICATIONS:  There were no immediate  procedural complications.   RESULTS:  1. HEMODYNAMICS:  The LV pressure was 159/7, the aortic pressure 166/90.   B.  LEFT VENTRICULOGRAPHY:  Left ventriculography in the RAO projection  demonstrated overall preserved LV systolic function.  There was very mild  posterobasal hypokinesis noted.   C.  CORONARY ANGIOGRAPHY:  The right coronary artery was a dominant vessel,  giving rise to the PDA.  There were also two posterolateral branches.  The  right coronary artery was free of significant obstructive coronary disease.  The left main coronary gave rise to the LAD and the left circumflex coronary  artery.  The LAD had two diagonal branches.  Just prior to the second  diagonal branch, there was a 60% stenosis.  In the apical portion of the LAD  just prior to its bifurcation at the apex, there was an 80% stenosis.  The  first and second diagonal branches were moderate-sized vessels, which were  free of significant angiographic coronary  disease.  The left circumflex gave  rise to one large and one small obtuse marginal branch.  The larger obtuse  marginal branch had three separate branches of its own.  There was a 70%  stenosis in the distal portion of this obtuse marginal branch, which may in  some views have been tight enough to have accounted for his symptoms.   CONCLUSION AND RECOMMENDATIONS:  This study demonstrates two-vessel coronary  disease with a 70% large obtuse marginal branch stenosis and an 80% apical  left anterior descending stenosis along with a 60% mid-left anterior  descending stenosis just prior to the first diagonal branch.  Overall the LV  function was preserved with a very mild posterobasal hypokinesis.  Plan to  discuss the case with E. Graceann Congress, M.D., and our interventional  colleagues.  My recommendation at this time would be to proceed with  exercise Cardiolite stress testing.  If this demonstrated lateral ischemia,  then angioplasty of the circumflex  marginal would be warranted.  If apical  or no ischemia were present, then I think medical therapy alone would be  also reasonable in this patient with atypical symptoms.                                               Doylene Canning. Ladona Ridgel, M.D. [facility]    GWT/MEDQ  D:  12/13/2002  T:  12/14/2002  Job:  [id number]   cc:   Rosalyn Gess. Norins, M.D. [facility]   E. Graceann Congress, M.D. [facility]

## 2011-02-12 NOTE — Assessment & Plan Note (Signed)
[facility]                            CARDIOLOGY OFFICE NOTE   PRESLEY, SUMMERLIN                     MRN:          [id number]  DATE:08/23/2006                            DOB:          1931-01-31    Tuesday, August 23, 2006   This is a patient of Dr. Diona Browner and Dr. Debby Bud.  This is a 77 year old  African American male patient of Dr. Simona Huh who has a history of  coronary artery disease status post CABG two years ago and has mild LV  dysfunction.  He walked into the office on August 15, 2006,  complaining of shortness of breath, some chest pain and cough.  Blood  pressure was elevated that day.  He had not taken his medications and he  was given an appointment for today.  He did see Dr. Debby Bud last week,  whose note is not available to me at this time.  He had chest x-ray and  CBC done.  Hemoglobin was 10.4, hematocrit 32 and chest x-ray, we are  trying to obtain results of.  He says approximately 2 weeks ago he  started developing shortness of breath when he lay down at night which  lead to a cough and pleuritic type chest pain from coughing.  He denies  any chest heaviness or pressure.  The chest pain is mostly when he  coughs and has moved from his anterior chest around to his back.  Overall the cough and shortness of breath have improved a little and the  chest pain has improved some too.  He has had some edema as well.  He  denies any excess of salt intake.  He was last seen here in August at  which time, Dr. [name] had asked him to resume his Toprol but he has  not done this.  Patient also says his heart beats fast when he is  coughing and short of breath.   CURRENT MEDICATIONS:  1. Glipizide XL 10 mg daily.  2. Lipitor 40 mg nightly.  3. Furosemide 20 mg daily.  4. Enalapril 20 mg daily.  5. Metformin 1000 mg b.i.d.  6. Zetia 10 mg daily.  7. Folic acid 1 mg daily.   PHYSICAL EXAMINATION:  GENERAL APPEARANCE:  This is a  pleasant 75-year-  old African American male in no acute distress.  VITAL SIGNS:  Blood pressure 186/66.  When I retook it, it was 170/90.  Pulse 95, weight 209.  NECK:  He has slight increase in JVD.  No HJR or bruit.  LUNGS:  Decreased breath sounds on the right.  He has basilar crackles  and rales on the left one third of the way up.  CARDIOVASCULAR:  Regular rate and rhythm at 90 beats per minute.  Normal  S1 and S2 with 1/6 systolic ejection murmur at the left sternal border.  ABDOMEN:  Obese, normal active bowel sounds are heard throughout.  EXTREMITIES:  There is +1 ankle edema bilaterally, decreased distal  pulses.   EKG:  Normal sinus rhythm at 97 beats per minute, left ventricular  fascicular block, Q-wave abnormalities,  QT control 467 msec.   IMPRESSION:  1. Suspect congestive heart failure.  2. Coronary artery disease, status post coronary artery bypass      grafting x2 in April 2006.  3. History of mild left ventricular dysfunction.  4. Hypertension, uncontrolled.  5. Diabetes mellitus.  6. Hyperlipidemia.  7. History of mediastinal adenopathy on previous CT followed by Dr.      Debby Bud.  8. Anemia.   PLAN:  We will do a BMET, BNP and follow-up CBC from last week's anemia.  I will also order a 2-D echo to reassess his LV function.  I have  increased his Lasix to 40 mg daily and given him a prescription for  Toprol XL 50 mg daily.  We are still waiting on the chest x-ray report  which is to be called to [place] and he will need to see Dr. Diona Browner back in  follow-up next week.  He may also need ischemia work-up once his heart  failure has resolved.      Jacolyn Reedy, PA-C  Electronically Signed      Jesse Sans. Daleen Squibb, MD, [facility]  Electronically Signed   ML/MedQ  DD: 08/23/2006  DT: 08/23/2006  Job #: [id number]

## 2011-02-21 ENCOUNTER — Other Ambulatory Visit: Payer: Self-pay | Admitting: Cardiology

## 2011-04-06 ENCOUNTER — Telehealth: Payer: Self-pay | Admitting: Cardiology

## 2011-05-02 ENCOUNTER — Other Ambulatory Visit: Payer: Self-pay | Admitting: Internal Medicine

## 2011-08-11 ENCOUNTER — Other Ambulatory Visit: Payer: Self-pay | Admitting: Internal Medicine

## 2011-08-13 ENCOUNTER — Other Ambulatory Visit: Payer: Self-pay | Admitting: Internal Medicine

## 2011-09-07 ENCOUNTER — Other Ambulatory Visit: Payer: Self-pay | Admitting: Cardiology

## 2011-09-27 ENCOUNTER — Other Ambulatory Visit: Payer: Self-pay | Admitting: Cardiology

## 2011-09-29 NOTE — Telephone Encounter (Signed)
..   Requested Prescriptions   Pending Prescriptions Disp Refills  . metoprolol succinate (TOPROL-XL) 25 MG 24 hr tablet [Pharmacy Med Name: METOPROLOL SUCC ER 25 MG TAB] 30 tablet 3    Sig: take 1 tablet by mouth once daily  . simvastatin (ZOCOR) 40 MG tablet [Pharmacy Med Name: SIMVASTATIN 40 MG TABLET] 30 tablet 3    Sig: take 1 tablet by mouth at bedtime   E-scribe to [facility] [employer] on [address].

## 2011-10-22 ENCOUNTER — Other Ambulatory Visit: Payer: Self-pay | Admitting: Internal Medicine

## 2011-10-22 NOTE — Telephone Encounter (Signed)
Done

## 2011-11-19 ENCOUNTER — Other Ambulatory Visit: Payer: Self-pay | Admitting: *Deleted

## 2011-11-19 MED ORDER — GLIMEPIRIDE 2 MG PO TABS: 2.0000 mg | ORAL_TABLET | Freq: Every day | ORAL | Status: DC

## 2011-11-19 NOTE — Telephone Encounter (Signed)
Done

## 2011-12-20 ENCOUNTER — Encounter: Payer: Self-pay | Admitting: Nurse Practitioner

## 2011-12-20 ENCOUNTER — Ambulatory Visit
Admission: RE | Admit: 2011-12-20 | Discharge: 2011-12-20 | Disposition: A | Discharge status: Home / Self Care | Payer: Medicare Other | Source: Ambulatory Visit | Attending: Nurse Practitioner | Admitting: Nurse Practitioner

## 2011-12-20 ENCOUNTER — Other Ambulatory Visit: Payer: Self-pay | Admitting: Nurse Practitioner

## 2011-12-20 ENCOUNTER — Ambulatory Visit (INDEPENDENT_AMBULATORY_CARE_PROVIDER_SITE_OTHER): Payer: PRIVATE HEALTH INSURANCE | Admitting: Nurse Practitioner

## 2011-12-20 ENCOUNTER — Telehealth: Payer: Self-pay | Admitting: Cardiology

## 2011-12-20 DIAGNOSIS — R0609 Other forms of dyspnea: Secondary | ICD-10-CM

## 2011-12-20 DIAGNOSIS — I2510 Atherosclerotic heart disease of native coronary artery without angina pectoris: Secondary | ICD-10-CM

## 2011-12-20 DIAGNOSIS — R0989 Other specified symptoms and signs involving the circulatory and respiratory systems: Secondary | ICD-10-CM

## 2011-12-20 DIAGNOSIS — Z8679 Personal history of other diseases of the circulatory system: Secondary | ICD-10-CM

## 2011-12-20 DIAGNOSIS — I2589 Other forms of chronic ischemic heart disease: Secondary | ICD-10-CM

## 2011-12-20 DIAGNOSIS — Z01810 Encounter for preprocedural cardiovascular examination: Secondary | ICD-10-CM

## 2011-12-20 DIAGNOSIS — R079 Chest pain, unspecified: Secondary | ICD-10-CM

## 2011-12-20 DIAGNOSIS — R0600 Dyspnea, unspecified: Secondary | ICD-10-CM

## 2011-12-20 LAB — PROTIME-INR
INR: 1.16 (ref <1.50)
Prothrombin Time: 15 seconds (ref 11.6–15.2)

## 2011-12-20 LAB — BASIC METABOLIC PANEL
BUN: 26 mg/dL — ABNORMAL HIGH (ref 6–23)
CO2: 26 mEq/L (ref 19–32)
Calcium: 9.7 mg/dL (ref 8.4–10.5)
Chloride: 103 mEq/L (ref 96–112)
Creat: 1.89 mg/dL — ABNORMAL HIGH (ref 0.50–1.35)
Glucose, Bld: 198 mg/dL — ABNORMAL HIGH (ref 70–99)
Potassium: 4 mEq/L (ref 3.5–5.3)
Sodium: 138 mEq/L (ref 135–145)

## 2011-12-20 LAB — BRAIN NATRIURETIC PEPTIDE: Brain Natriuretic Peptide: 475.9 pg/mL — ABNORMAL HIGH (ref 0.0–100.0)

## 2011-12-20 LAB — CBC WITH DIFFERENTIAL/PLATELET
Basophils Absolute: 0 10*3/uL (ref 0.0–0.1)
Basophils Relative: 0 % (ref 0–1)
Eosinophils Absolute: 0 10*3/uL (ref 0.0–0.7)
Eosinophils Relative: 0 % (ref 0–5)
HCT: 36.3 % — ABNORMAL LOW (ref 39.0–52.0)
Hemoglobin: 11.5 g/dL — ABNORMAL LOW (ref 13.0–17.0)
Lymphocytes Relative: 35 % (ref 12–46)
Lymphs Abs: 1.8 10*3/uL (ref 0.7–4.0)
MCH: 28.4 pg (ref 26.0–34.0)
MCHC: 31.7 g/dL (ref 30.0–36.0)
MCV: 89.6 fL (ref 78.0–100.0)
Monocytes Absolute: 0.7 10*3/uL (ref 0.1–1.0)
Monocytes Relative: 7 % (ref 3–12)
Neutro Abs: 3 10*3/uL (ref 1.7–7.7)
Neutrophils Relative %: 58 % (ref 43–77)
Platelets: 168 10*3/uL (ref 150–400)
RBC: 4.05 MIL/uL — ABNORMAL LOW (ref 4.22–5.81)
RDW: 15.1 % (ref 11.5–15.5)
WBC: 5.1 10*3/uL (ref 4.0–10.5)

## 2011-12-20 LAB — APTT: aPTT: 32 seconds (ref 24–37)

## 2011-12-20 NOTE — Assessment & Plan Note (Signed)
Patient presents with a several month history of exertional chest tightness with DOE. EKG with more changes consistent with ischemia noted. He has no symptoms at rest. I have spoken to Dr. [name]. Will arrange for cardiac catheterization with possible PCI. We are increasing his Toprol to two tablets daily. Labs will be checked today. CXR will be obtained. Will tentatively see him back in about 2 weeks for a post hospital visit. He is to minimize his activities until his cardiac catheterization. The procedure, risks and benefits have been reviewed and he is willing to proceed. Patient is agreeable to this plan and will call if any problems develop in the interim.

## 2011-12-20 NOTE — Progress Notes (Signed)
Addended by: Tonita Phoenix on: 12/20/2011 12:43 PM   Modules accepted: Orders

## 2011-12-20 NOTE — Telephone Encounter (Signed)
New Msg: Pt calling wanting to speak with nurse/MD about possibly being seen today. Pt c/o pain chest. Pt symptoms have been present for about 2-3wks. Pt NOT c/o chest pain at present moment. Pt also c/o sob. Pt c/o sob now. Please return pt call to discuss further.

## 2011-12-20 NOTE — Assessment & Plan Note (Signed)
Will need to readdress his carotids on return.

## 2011-12-20 NOTE — Telephone Encounter (Signed)
Spoke with pt, for the last couple weeks he has had left chest tightness with exertion, that goes away with rest. He is also having SOB with little exertion and is having to prop up on two pillows to be able to breathe to sleep. He has not taken NTG. He reports the symptoms are getting more regular. These are the first symptoms he has had since his heart surgery. He is pain free at present. He will see lori gerhardt np today.

## 2011-12-20 NOTE — Patient Instructions (Addendum)
We are going to arrange for a heart catheterization tomorrow with Dr. Clifton James  We will check labs today and send you for a CXR. We are going to arrange for an ultrasound of your heart  Go to [facility] on the first floor for your chest Xray  You are scheduled for a cardiac catheterization on Tuesday, March 26th with Dr. Clifton James or associate.  Go to [facility] 2nd Floor Short Stay on 8:30 am on Tuesday. No food or drink after midnight tonight. Do not take your Amaryl tomorrow Take only half dose of your insulin tonight You may take your other medications with a sip of water on the day of your procedure.   You have to have someone drive you home.   Increase your Toprol to 2 tablets today.  Coronary Angiography Coronary angiography is an X-ray procedure used to look at the arteries in the heart. In this procedure, a dye is injected through a long, hollow tube (catheter). The catheter is about the size of a piece of cooked spaghetti. The catheter injects a dye into an artery in your groin. X-rays are then taken to show if there is a blockage in the arteries of your heart. BEFORE THE PROCEDURE   Let your caregiver know if you have allergies to shellfish or contrast dye. Also let your caregiver know if you have kidney problems or failure.   Do not eat or drink starting from midnight up to the time of the procedure, or as directed.   You may drink enough water to take your medications the morning of the procedure if you were instructed to do so.   You should be at the hospital or outpatient facility where the procedure is to be done 60 minutes prior to the procedure or as directed.  PROCEDURE  You may be given an IV medication to help you relax before the procedure.   You will be prepared for the procedure by washing and shaving the area where the catheter will be inserted. This is usually done in the groin but may be done in the fold of your arm by your  elbow.   A medicine will be given to numb your groin where the catheter will be inserted.   A specially trained doctor will insert the catheter into an artery in your groin. The catheter is guided by using a special type of X-ray (fluoroscopy) to the blood vessel being examined.   A special dye is then injected into the catheter and X-rays are taken. The dye helps to show where any narrowing or blockages are located in the heart arteries.  AFTER THE PROCEDURE   After the procedure you will be kept in bed lying flat for several hours. You will be instructed to not bend or cross your legs.   The groin insertion site will be watched and checked frequently.   The pulse in your feet will be checked frequently.   Additional blood tests, X-rays and an EKG may be done.   You may stay in the hospital overnight for observation.  SEEK IMMEDIATE MEDICAL CARE IF:   You develop chest pain, shortness of breath, feel faint, or pass out.   There is bleeding, swelling, or drainage from the catheter insertion site.   You develop pain, discoloration, coldness, or severe bruising in the leg or area where the catheter was inserted.   You have a fever.  Document Released: 03/20/2003 Document Revised: 09/02/2011 Document Reviewed: 05/08/2008 Exit[employer] Patient  Information 2012 [place], [place].

## 2011-12-20 NOTE — Assessment & Plan Note (Signed)
Last EF was 46%. Will update his echo as well. He is on ARB, beta blocker and diuretic therapy.

## 2011-12-20 NOTE — Progress Notes (Signed)
Addended by: Tonita Phoenix on: 12/20/2011 12:41 PM   Modules accepted: Orders

## 2011-12-20 NOTE — Progress Notes (Signed)
[name] Date of Birth: [birth year] Medical Record #[id number]  History of Present Illness: [name] is seen today for a work in visit. He is seen for Dr. [name]. He has known CAD with CABG in 2006. EF last measured at 46%. He called earlier today and spoke with Deliah Goody, RN. Reported that for the last couple of weeks, he has been having left sided chest tightness with exertion that goes away with rest. He has been short of breath with little exertion and has to prop up on two pillows. No NTG use. He says his symptoms are "getting more regular". He tells me that he has actually been having these symptoms for the last several months. He had tried to call previously but had the wrong number.  He denies any change in symptoms that led to today's call. He has no symptoms at rest. He reports that this is the first symptoms he has had since his heart surgery. He is not having any palpitations. No swelling. No cough. No abdominal bloating. Says he has been taking his medicines but has not taken today. No recent labs. Says his sugars have been ok. Has not seen Dr. Debby Bud since last year.   Current Outpatient Prescriptions on File Prior to Visit  Medication Sig Dispense Refill  . furosemide (LASIX) 40 MG tablet take 1 tablet by mouth once daily  30 tablet  1  . glimepiride (AMARYL) 2 MG tablet Take 1 tablet (2 mg total) by mouth daily before breakfast. Diabetes.  30 tablet  0  . LEVEMIR FLEXPEN 100 UNIT/ML injection inject 30 units at bedtime  15 Syringe  5  . losartan (COZAAR) 50 MG tablet take 1 tablet by mouth once daily  30 tablet  5  . metoprolol succinate (TOPROL-XL) 25 MG 24 hr tablet take 1 tablet by mouth once daily  30 tablet  3  . simvastatin (ZOCOR) 40 MG tablet take 1 tablet by mouth at bedtime  30 tablet  3    No Known Allergies  Past Medical History  Diagnosis Date  . CAD (coronary artery disease)     S/P CABG in 2006  . LV dysfunction     Per Myoview in 2008; Ef 46%  with prior inferior and apical infarct, no ischemia  . Cerebrovascular disease   . Diabetes mellitus   . HLD (hyperlipidemia)   . Diastolic dysfunction   . Mediastinal adenopathy     per CT chest in 2006 with PET scan showing very limited metabolic activity; not felt to have a significant neoplastic potential  . Anemia     Past Surgical History  Procedure Date  . Coronary artery bypass graft 2006    SVG to OM2, SVG to LAD, SVG to DX; (the LIMA did not have good flow and therefore was not used)    History  Smoking status  . Former Smoker  . Quit date: 09/27/1953  Smokeless tobacco  . Not on file    History  Alcohol Use No    History reviewed. No pertinent family history.  Review of Systems: The review of systems is per the HPI.  All other systems were reviewed and are negative.  Physical Exam: BP 192/88  Pulse 77  Ht 5\' 11"  (1.803 m)  Wt 212 lb (96.163 kg)  BMI 29.57 kg/m2  SpO2 98% Patient is very pleasant and in no acute distress. Skin is warm and dry. Color is normal.  HEENT is unremarkable. Normocephalic/atraumatic. PERRL. Sclera are  nonicteric. Neck is supple. No masses. No JVD. Lungs are clear. Cardiac exam shows a regular rate and rhythm. Abdomen is soft. Extremities are without edema. Gait and ROM are intact. No gross neurologic deficits noted.  LABORATORY DATA: Lab Results  Component Value Date   WBC 5.8 07/24/2008   HGB 11.7* 07/24/2008   HCT 34.6* 07/24/2008   PLT 187 07/24/2008   GLUCOSE 515 Repeated and verified X2. mg/dL* 1/61/0960   CHOL 88 4/54/0981   TRIG 48.0 10/22/2010   HDL 37.80* 10/22/2010   LDLCALC 41 10/22/2010   ALT 47 10/22/2010   AST 47* 10/22/2010   NA 132* 04/23/2010   K 3.9 04/23/2010   CL 94* 04/23/2010   CREATININE 2.0* 04/23/2010   BUN 21 04/23/2010   CO2 25 04/23/2010   HGBA1C 10.0* 10/22/2010   EKG today shows sinus rhythm. He now has more inferolateral T wave changes on today's tracing. Tracing is reviewed with Dr. [name].    Assessment / Plan:

## 2011-12-21 ENCOUNTER — Ambulatory Visit (HOSPITAL_COMMUNITY)
Admission: RE | Admit: 2011-12-21 | Discharge: 2011-12-23 | Disposition: A | Discharge status: Home / Self Care | Payer: Medicare Other | Source: Ambulatory Visit | Attending: Cardiovascular Disease | Admitting: Cardiovascular Disease

## 2011-12-21 ENCOUNTER — Encounter (HOSPITAL_COMMUNITY)
Admission: RE | Disposition: A | Discharge status: Home / Self Care | Payer: Self-pay | Source: Ambulatory Visit | Attending: Cardiovascular Disease

## 2011-12-21 ENCOUNTER — Encounter (HOSPITAL_COMMUNITY): Payer: Self-pay | Admitting: General Practice

## 2011-12-21 DIAGNOSIS — I2510 Atherosclerotic heart disease of native coronary artery without angina pectoris: Secondary | ICD-10-CM

## 2011-12-21 DIAGNOSIS — R0600 Dyspnea, unspecified: Secondary | ICD-10-CM

## 2011-12-21 DIAGNOSIS — I129 Hypertensive chronic kidney disease with stage 1 through stage 4 chronic kidney disease, or unspecified chronic kidney disease: Secondary | ICD-10-CM | POA: Insufficient documentation

## 2011-12-21 DIAGNOSIS — N289 Disorder of kidney and ureter, unspecified: Secondary | ICD-10-CM | POA: Insufficient documentation

## 2011-12-21 DIAGNOSIS — E119 Type 2 diabetes mellitus without complications: Secondary | ICD-10-CM | POA: Insufficient documentation

## 2011-12-21 DIAGNOSIS — I25810 Atherosclerosis of coronary artery bypass graft(s) without angina pectoris: Secondary | ICD-10-CM | POA: Insufficient documentation

## 2011-12-21 DIAGNOSIS — Z01810 Encounter for preprocedural cardiovascular examination: Secondary | ICD-10-CM

## 2011-12-21 DIAGNOSIS — R0989 Other specified symptoms and signs involving the circulatory and respiratory systems: Secondary | ICD-10-CM | POA: Insufficient documentation

## 2011-12-21 DIAGNOSIS — E785 Hyperlipidemia, unspecified: Secondary | ICD-10-CM | POA: Insufficient documentation

## 2011-12-21 DIAGNOSIS — D649 Anemia, unspecified: Secondary | ICD-10-CM | POA: Insufficient documentation

## 2011-12-21 DIAGNOSIS — R599 Enlarged lymph nodes, unspecified: Secondary | ICD-10-CM | POA: Insufficient documentation

## 2011-12-21 DIAGNOSIS — I441 Atrioventricular block, second degree: Secondary | ICD-10-CM | POA: Insufficient documentation

## 2011-12-21 DIAGNOSIS — N189 Chronic kidney disease, unspecified: Secondary | ICD-10-CM | POA: Insufficient documentation

## 2011-12-21 DIAGNOSIS — R0609 Other forms of dyspnea: Secondary | ICD-10-CM | POA: Insufficient documentation

## 2011-12-21 HISTORY — DX: Gastro-esophageal reflux disease without esophagitis: K21.9

## 2011-12-21 HISTORY — DX: Essential (primary) hypertension: I10

## 2011-12-21 HISTORY — PX: CARDIAC CATHETERIZATION: SHX172

## 2011-12-21 HISTORY — PX: LEFT HEART CATHETERIZATION WITH CORONARY/GRAFT ANGIOGRAM: SHX5450

## 2011-12-21 HISTORY — DX: Atrioventricular block, second degree: I44.1

## 2011-12-21 SURGERY — LEFT HEART CATHETERIZATION WITH CORONARY/GRAFT ANGIOGRAM
Anesthesia: LOCAL

## 2011-12-21 MED ORDER — GLIMEPIRIDE 2 MG PO TABS
2.0000 mg | ORAL_TABLET | Freq: Every day | ORAL | Status: DC
  Administered 2011-12-22 – 2011-12-23 (×2): 2 mg via ORAL
  Filled 2011-12-21 (×3): qty 1

## 2011-12-21 MED ORDER — HEPARIN (PORCINE) IN NACL 2-0.9 UNIT/ML-% IJ SOLN
INTRAMUSCULAR | Status: AC
  Filled 2011-12-21: qty 2000

## 2011-12-21 MED ORDER — ACETAMINOPHEN 325 MG PO TABS
650.0000 mg | ORAL_TABLET | ORAL | Status: DC | PRN
  Administered 2011-12-22: 650 mg via ORAL
  Filled 2011-12-21: qty 2

## 2011-12-21 MED ORDER — SODIUM CHLORIDE 0.9 % IJ SOLN: 3.0000 mL | INTRAMUSCULAR | Status: DC | PRN

## 2011-12-21 MED ORDER — DIAZEPAM 5 MG PO TABS
5.0000 mg | ORAL_TABLET | ORAL | Status: AC
  Administered 2011-12-21: 5 mg via ORAL
  Filled 2011-12-21: qty 1

## 2011-12-21 MED ORDER — MIDAZOLAM HCL 2 MG/2ML IJ SOLN
INTRAMUSCULAR | Status: AC
  Filled 2011-12-21: qty 2

## 2011-12-21 MED ORDER — HYDRALAZINE HCL 20 MG/ML IJ SOLN
INTRAMUSCULAR | Status: AC
  Filled 2011-12-21: qty 1

## 2011-12-21 MED ORDER — SODIUM CHLORIDE 0.9 % IV SOLN: 250.0000 mL | INTRAVENOUS | Status: DC | PRN

## 2011-12-21 MED ORDER — LOSARTAN POTASSIUM 50 MG PO TABS
50.0000 mg | ORAL_TABLET | Freq: Every day | ORAL | Status: DC
  Administered 2011-12-22 – 2011-12-23 (×2): 50 mg via ORAL
  Filled 2011-12-21 (×2): qty 1

## 2011-12-21 MED ORDER — ASPIRIN 300 MG RE SUPP
300.0000 mg | RECTAL | Status: AC
  Filled 2011-12-21: qty 1

## 2011-12-21 MED ORDER — SIMVASTATIN 40 MG PO TABS
40.0000 mg | ORAL_TABLET | Freq: Every day | ORAL | Status: DC
  Administered 2011-12-21 – 2011-12-22 (×2): 40 mg via ORAL
  Filled 2011-12-21 (×4): qty 1

## 2011-12-21 MED ORDER — SODIUM CHLORIDE 0.9 % IV SOLN
INTRAVENOUS | Status: DC
  Administered 2011-12-21: 1000 mL via INTRAVENOUS

## 2011-12-21 MED ORDER — ASPIRIN 81 MG PO CHEW: 324.0000 mg | CHEWABLE_TABLET | ORAL | Status: AC

## 2011-12-21 MED ORDER — ONDANSETRON HCL 4 MG/2ML IJ SOLN: 4.0000 mg | Freq: Four times a day (QID) | INTRAMUSCULAR | Status: DC | PRN

## 2011-12-21 MED ORDER — NITROGLYCERIN 0.2 MG/ML ON CALL CATH LAB
INTRAVENOUS | Status: AC
  Filled 2011-12-21: qty 1

## 2011-12-21 MED ORDER — FUROSEMIDE 40 MG PO TABS
40.0000 mg | ORAL_TABLET | Freq: Every day | ORAL | Status: DC
  Administered 2011-12-22 – 2011-12-23 (×2): 40 mg via ORAL
  Filled 2011-12-21 (×2): qty 1

## 2011-12-21 MED ORDER — ACETAMINOPHEN 325 MG PO TABS: 650.0000 mg | ORAL_TABLET | ORAL | Status: DC | PRN

## 2011-12-21 MED ORDER — ASPIRIN EC 81 MG PO TBEC
81.0000 mg | DELAYED_RELEASE_TABLET | Freq: Every day | ORAL | Status: DC
  Administered 2011-12-22 – 2011-12-23 (×2): 81 mg via ORAL
  Filled 2011-12-21 (×2): qty 1

## 2011-12-21 MED ORDER — FENTANYL CITRATE 0.05 MG/ML IJ SOLN
INTRAMUSCULAR | Status: AC
  Filled 2011-12-21: qty 2

## 2011-12-21 MED ORDER — ASPIRIN 81 MG PO CHEW
324.0000 mg | CHEWABLE_TABLET | ORAL | Status: AC
  Administered 2011-12-21: 324 mg via ORAL
  Filled 2011-12-21: qty 4

## 2011-12-21 MED ORDER — SODIUM CHLORIDE 0.9 % IJ SOLN: 3.0000 mL | Freq: Two times a day (BID) | INTRAMUSCULAR | Status: DC

## 2011-12-21 MED ORDER — NITROGLYCERIN 0.4 MG SL SUBL: 0.4000 mg | SUBLINGUAL_TABLET | SUBLINGUAL | Status: DC | PRN

## 2011-12-21 MED ORDER — INSULIN DETEMIR 100 UNIT/ML ~~LOC~~ SOLN
30.0000 [IU] | Freq: Every day | SUBCUTANEOUS | Status: DC
  Administered 2011-12-21: 30 [IU] via SUBCUTANEOUS
  Filled 2011-12-21: qty 3

## 2011-12-21 MED ORDER — LIDOCAINE HCL (PF) 1 % IJ SOLN
INTRAMUSCULAR | Status: AC
  Filled 2011-12-21: qty 30

## 2011-12-21 MED ORDER — SODIUM CHLORIDE 0.9 % IV SOLN: INTRAVENOUS | Status: AC

## 2011-12-21 NOTE — Interval H&P Note (Signed)
History and Physical Interval Note:  12/21/2011 3:05 PM  [name]  has presented today for cardiac cath with the diagnosis of Chest pain  The various methods of treatment have been discussed with the patient and family. After consideration of risks, benefits and other options for treatment, the patient has consented to  Procedure(s) (LRB): LEFT HEART CATHETERIZATION WITH CORONARY/GRAFT ANGIOGRAM (N/A) as a surgical intervention .  The patients' history has been reviewed, patient examined, no change in status, stable for surgery.  I have reviewed the patients' chart and labs.  Questions were answered to the patient's satisfaction.     Shanaya Schneck

## 2011-12-21 NOTE — CV Procedure (Signed)
   Cardiac Catheterization Operative Report  ARSHIA RONDON [id number] 3/26/20133:52 PM Illene Regulus, MD, MD  Procedure Performed:  1. Left Heart Catheterization 2. Selective Coronary Angiography 3. SVG angiography  Operator: Verne Carrow, MD  Indication: Chest pain, dyspnea in pt with previous CABG.                                      Procedure Details: The risks, benefits, complications, treatment options, and expected outcomes were discussed with the patient. The patient and/or family concurred with the proposed plan, giving informed consent. The patient was brought to the cath lab after IV hydration was begun and oral premedication was given. The patient was further sedated with Versed and Fentanyl. The right groin was prepped and draped in the usual manner. Using the modified Seldinger access technique, a 5 French sheath was placed in the right femoral artery. Standard diagnostic catheters were used to perform selective coronary angiography. A LCB catheter was used to engage the SVG to the OM. A pigtail catheter was used to measure LV pressures.   There were no immediate complications. 2nd degree AV block during the case. The patient was taken to the recovery area in stable condition.   Hemodynamic Findings:  Central aortic pressure: 162/68 Left ventricular pressure: 178/8/18  Angiographic Findings:  Left main: No obstructive disease.   Left Anterior Descending Artery: 95% proximal stenosis. 100% mid stenosis after a small diagonal and large septal perforator. The mid and distal LAD fills from the vein graft. The apical LAD has a 99% stenosis, unchanged from prior cath.   Circumflex Artery: Large Ramus intermediate branch with proximal 20% plaque. The Circumflex has a tubular 40% proximal stenosis. The first OM is small caliber and has mild plaque disease. The distal Circumflex leading into OM2 is occluded. The second OM fills from the vein graft.   Right Coronary  Artery: Large, dominant vessel with proximal 25% stenosis, mid 40% stenosis, distal 25% stenosis.   Graft Anatomy:   SVG to LAD is patent. The body of the vein graft has minor irregularities.   SVG to second OM branch is patent. The body of the vein graft has minor disease in the ostium and proximal segment.   SVG to Diagonal is occluded.   Left Ventricular Angiogram: Not performed.   Impression: 1. Double vessel CAD s/p 3V CABG with 2/3 patent bypass grafts.  2. Second degree AV block during catheterization.  Recommendations: His coronary anatomy is stable currently. His vein graft to the diagonal is occluded. This is presumed to have supplied a relatively small portion of myocardium. His other bypasses are patent and his native anatomy is stable. Medical management for now. He was observed to have periods of 2nd degree AV block during the case. I will admit him to telemetry and monitor tonight. He did note feeling dyspneic during the periods of heart block.        Complications:  None. The patient tolerated the procedure well.

## 2011-12-21 NOTE — H&P (View-Only) (Signed)
 [name] Date of Birth: 07/20/1931 Medical Record #[id number]  History of Present Illness: [name] is seen today for a work in visit. He is seen for Dr. Crenshaw. He has known CAD with CABG in 2006. EF last measured at 46%. He called earlier today and spoke with Debra Mathis, RN. Reported that for the last couple of weeks, he has been having left sided chest tightness with exertion that goes away with rest. He has been short of breath with little exertion and has to prop up on two pillows. No NTG use. He says his symptoms are "getting more regular". He tells me that he has actually been having these symptoms for the last several months. He had tried to call previously but had the wrong number.  He denies any change in symptoms that led to today's call. He has no symptoms at rest. He reports that this is the first symptoms he has had since his heart surgery. He is not having any palpitations. No swelling. No cough. No abdominal bloating. Says he has been taking his medicines but has not taken today. No recent labs. Says his sugars have been ok. Has not seen Dr. Norins since last year.   Current Outpatient Prescriptions on File Prior to Visit  Medication Sig Dispense Refill  . furosemide (LASIX) 40 MG tablet take 1 tablet by mouth once daily  30 tablet  1  . glimepiride (AMARYL) 2 MG tablet Take 1 tablet (2 mg total) by mouth daily before breakfast. Diabetes.  30 tablet  0  . LEVEMIR FLEXPEN 100 UNIT/ML injection inject 30 units at bedtime  15 Syringe  5  . losartan (COZAAR) 50 MG tablet take 1 tablet by mouth once daily  30 tablet  5  . metoprolol succinate (TOPROL-XL) 25 MG 24 hr tablet take 1 tablet by mouth once daily  30 tablet  3  . simvastatin (ZOCOR) 40 MG tablet take 1 tablet by mouth at bedtime  30 tablet  3    No Known Allergies  Past Medical History  Diagnosis Date  . CAD (coronary artery disease)     S/P CABG in 2006  . LV dysfunction     Per Myoview in 2008; Ef 46%  with prior inferior and apical infarct, no ischemia  . Cerebrovascular disease   . Diabetes mellitus   . HLD (hyperlipidemia)   . Diastolic dysfunction   . Mediastinal adenopathy     per CT chest in 2006 with PET scan showing very limited metabolic activity; not felt to have a significant neoplastic potential  . Anemia     Past Surgical History  Procedure Date  . Coronary artery bypass graft 2006    SVG to OM2, SVG to LAD, SVG to DX; (the LIMA did not have good flow and therefore was not used)    History  Smoking status  . Former Smoker  . Quit date: 09/27/1953  Smokeless tobacco  . Not on file    History  Alcohol Use No    History reviewed. No pertinent family history.  Review of Systems: The review of systems is per the HPI.  All other systems were reviewed and are negative.  Physical Exam: BP 192/88  Pulse 77  Ht 5' 11" (1.803 m)  Wt 212 lb (96.163 kg)  BMI 29.57 kg/m2  SpO2 98% Patient is very pleasant and in no acute distress. Skin is warm and dry. Color is normal.  HEENT is unremarkable. Normocephalic/atraumatic. PERRL. Sclera are   nonicteric. Neck is supple. No masses. No JVD. Lungs are clear. Cardiac exam shows a regular rate and rhythm. Abdomen is soft. Extremities are without edema. Gait and ROM are intact. No gross neurologic deficits noted.  LABORATORY DATA: Lab Results  Component Value Date   WBC 5.8 07/24/2008   HGB 11.7* 07/24/2008   HCT 34.6* 07/24/2008   PLT 187 07/24/2008   GLUCOSE 515 Repeated and verified X2. mg/dL* 04/23/2010   CHOL 88 10/22/2010   TRIG 48.0 10/22/2010   HDL 37.80* 10/22/2010   LDLCALC 41 10/22/2010   ALT 47 10/22/2010   AST 47* 10/22/2010   NA 132* 04/23/2010   K 3.9 04/23/2010   CL 94* 04/23/2010   CREATININE 2.0* 04/23/2010   BUN 21 04/23/2010   CO2 25 04/23/2010   HGBA1C 10.0* 10/22/2010   EKG today shows sinus rhythm. He now has more inferolateral T wave changes on today's tracing. Tracing is reviewed with Dr. Crenshaw.    Assessment / Plan:  

## 2011-12-22 ENCOUNTER — Ambulatory Visit (HOSPITAL_COMMUNITY): Payer: Medicare Other

## 2011-12-22 ENCOUNTER — Other Ambulatory Visit: Payer: Self-pay

## 2011-12-22 DIAGNOSIS — Z01810 Encounter for preprocedural cardiovascular examination: Secondary | ICD-10-CM

## 2011-12-22 LAB — BASIC METABOLIC PANEL
BUN: 26 mg/dL — ABNORMAL HIGH (ref 6–23)
CO2: 24 mEq/L (ref 19–32)
Chloride: 107 mEq/L (ref 96–112)
Creatinine, Ser: 2.07 mg/dL — ABNORMAL HIGH (ref 0.50–1.35)
Glucose, Bld: 128 mg/dL — ABNORMAL HIGH (ref 70–99)
Potassium: 4 mEq/L (ref 3.5–5.1)

## 2011-12-22 LAB — CBC
HCT: 32.5 % — ABNORMAL LOW (ref 39.0–52.0)
Hemoglobin: 10.6 g/dL — ABNORMAL LOW (ref 13.0–17.0)
MCV: 88.8 fL (ref 78.0–100.0)
RBC: 3.66 MIL/uL — ABNORMAL LOW (ref 4.22–5.81)
WBC: 4.5 10*3/uL (ref 4.0–10.5)

## 2011-12-22 LAB — GLUCOSE, CAPILLARY: Glucose-Capillary: 113 mg/dL — ABNORMAL HIGH (ref 70–99)

## 2011-12-22 LAB — LIPID PANEL
HDL: 36 mg/dL — ABNORMAL LOW (ref >39)
LDL Cholesterol: 60 mg/dL (ref 0–99)
Total CHOL/HDL Ratio: 3 RATIO
Triglycerides: 58 mg/dL (ref <150)

## 2011-12-22 MED ORDER — HYDRALAZINE HCL 25 MG PO TABS
25.0000 mg | ORAL_TABLET | Freq: Three times a day (TID) | ORAL | Status: DC
  Administered 2011-12-22 – 2011-12-23 (×5): 25 mg via ORAL
  Filled 2011-12-22 (×7): qty 1

## 2011-12-22 NOTE — Progress Notes (Signed)
SUBJECTIVE: Pt reports some dyspnea this am. One episode of mild chest pain this am. 6 beat run of VT overnight but no pauses/high grade AV block noted on telemetry.   BP 164/76  Pulse 87  Temp(Src) 99.1 F (37.3 C) (Oral)  Resp 20  Ht 5\' 11"  (1.803 m)  Wt 207 lb 8 oz (94.121 kg)  BMI 28.94 kg/m2  SpO2 96%  Intake/Output Summary (Last 24 hours) at 12/22/11 0745 Last data filed at 12/21/11 1843  Gross per 24 hour  Intake  73.33 ml  Output      0 ml  Net  73.33 ml    PHYSICAL EXAM General: Well developed, well nourished, in no acute distress. Alert and oriented x 3.  Psych:  Good affect, responds appropriately Neck: No JVD. No masses noted.  Lungs: Clear bilaterally with decreased breath sounds in bases. No wheezes or rhonci noted.  Heart: RRR with no murmurs noted. Abdomen: Bowel sounds are present. Soft, non-tender.  Extremities: No lower extremity edema.   LABS: Basic Metabolic Panel:  Basename 12/22/11 0530 12/20/11 1233  NA 140 138  K 4.0 4.0  CL 107 103  CO2 24 26  GLUCOSE 128* 198*  BUN 26* 26*  CREATININE 2.07* 1.89*  CALCIUM 9.1 9.7  MG -- --  PHOS -- --   CBC:  Basename 12/22/11 0530 12/20/11 1233  WBC 4.5 5.1  NEUTROABS -- 3.0  HGB 10.6* 11.5*  HCT 32.5* 36.3*  MCV 88.8 89.6  PLT 152 168    Fasting Lipid Panel:  Basename 12/22/11 0530  CHOL 108  HDL 36*  LDLCALC 60  TRIG 58  CHOLHDL 3.0  LDLDIRECT --    Current Meds:    . aspirin  324 mg Oral Pre-Cath  . aspirin  324 mg Oral NOW   Or  . aspirin  300 mg Rectal NOW  . aspirin EC  81 mg Oral Daily  . diazepam  5 mg Oral On Call  . fentaNYL      . furosemide  40 mg Oral Daily  . glimepiride  2 mg Oral QAC breakfast  . heparin      . hydrALAZINE      . insulin detemir  30 Units Subcutaneous QHS  . lidocaine      . losartan  50 mg Oral Daily  . midazolam      . nitroGLYCERIN      . simvastatin  40 mg Oral q1800  . DISCONTD: sodium chloride  3 mL Intravenous Q12H      ASSESSMENT AND PLAN:  1. CAD: stable s/p cath yesterday. He was found to have patent bypass grafts to the LAD and OM. His native RCA is patent. His vein graft to the Diagonal is occluded. No options for percutaneous revascularization. Will continue medical management. I stopped his beta blocker because of occasional episodes of 2nd degree AV block during the cath.   2. Heart block:  No evidence of high grade HB overnight. Beta blocker held. Pt has first degree AV block.   3. Chronic renal insufficiency: Hydration post cath. Creatinine is stable.   4. HTN: BP elevated with beta blocker on hold. Will add hydralazine 20 mg po BID.   5. HLD: Lipids well controlled.   6. Dyspnea: Pt reports feeling the way he did when he had pleural effusions in the past. Breath sounds are abnormal. Will check CXR today.   7. Dispo: Will ambulate. F/U on CXR. If stable and  feels better, could consider afternoon d/c.   [name]  3/27/20137:45 AM

## 2011-12-22 NOTE — Plan of Care (Signed)
Problem: Phase II Progression Outcomes Goal: Complications resolved/controlled Outcome: Not Progressing Pt with increased SOB while lying flat, CXR ordered by MD.  Will continue to monitor.

## 2011-12-22 NOTE — Progress Notes (Signed)
UR Completed. Simmons, Frank Novelo F [phone]  

## 2011-12-22 NOTE — Progress Notes (Signed)
Inpatient Diabetes Program Recommendations  AACE/ADA: New Consensus Statement on Inpatient Glycemic Control (2009)  Target Ranges:  Prepandial:   less than 140 mg/dL      Peak postprandial:   less than 180 mg/dL (1-2 hours)      Critically ill patients:  140 - 180 mg/dL    Inpatient Diabetes Program Recommendations Correction (SSI): start Novolog SENSITIVE TID  Patient is ordered basal insulin but no correction insulin.    Thank you  Piedad Climes RN,BSN,CDE Inpatient Diabetes Coordinator

## 2011-12-23 ENCOUNTER — Encounter (HOSPITAL_COMMUNITY): Payer: Self-pay | Admitting: Physician Assistant

## 2011-12-23 DIAGNOSIS — R0989 Other specified symptoms and signs involving the circulatory and respiratory systems: Secondary | ICD-10-CM

## 2011-12-23 DIAGNOSIS — I2510 Atherosclerotic heart disease of native coronary artery without angina pectoris: Secondary | ICD-10-CM

## 2011-12-23 LAB — CBC
HCT: 32 % — ABNORMAL LOW (ref 39.0–52.0)
MCH: 28.8 pg (ref 26.0–34.0)
MCV: 87 fL (ref 78.0–100.0)
Platelets: 147 10*3/uL — ABNORMAL LOW (ref 150–400)
RBC: 3.68 MIL/uL — ABNORMAL LOW (ref 4.22–5.81)
WBC: 4.9 10*3/uL (ref 4.0–10.5)

## 2011-12-23 LAB — BASIC METABOLIC PANEL
CO2: 24 mEq/L (ref 19–32)
Calcium: 9.2 mg/dL (ref 8.4–10.5)
Chloride: 104 mEq/L (ref 96–112)
Creatinine, Ser: 2.34 mg/dL — ABNORMAL HIGH (ref 0.50–1.35)
Glucose, Bld: 118 mg/dL — ABNORMAL HIGH (ref 70–99)
Sodium: 141 mEq/L (ref 135–145)

## 2011-12-23 LAB — GLUCOSE, CAPILLARY: Glucose-Capillary: 157 mg/dL — ABNORMAL HIGH (ref 70–99)

## 2011-12-23 MED ORDER — ASPIRIN 81 MG PO TBEC: 81.0000 mg | DELAYED_RELEASE_TABLET | Freq: Every day | ORAL | Status: DC

## 2011-12-23 MED ORDER — NITROGLYCERIN 0.4 MG SL SUBL: 0.4000 mg | SUBLINGUAL_TABLET | SUBLINGUAL | Status: DC | PRN

## 2011-12-23 MED ORDER — HYDRALAZINE HCL 25 MG PO TABS: 25.0000 mg | ORAL_TABLET | Freq: Three times a day (TID) | ORAL | Status: DC

## 2011-12-23 NOTE — Discharge Summary (Signed)
Discharge Summary   Patient ID: [name] MRN: [id number], DOB/AGE: [birth year] 78 y.o. Admit date: 12/21/2011 D/C date:     12/23/2011   Primary Discharge Diagnoses:  1. Coronary artery disease - stable anatomy by cath this admission, 2/3 patent bypass grafts for med rx - s/p CABG 2006 - hx of LV dysfunction with EF 46% by myoview in 2008 2. Mobitz Type I Heart Block, BB discontinued 3. Acute on chronic renal insufficiency, possibly contrast nephropathy - for repeat BMET tomorrow - Cozaar placed on hold starting tomorrow (got dose already today) 4. HTN 5. Hyperlipidemia  Secondary Discharge Diagnoses:  1. Cerebrovascular disease 2. Diabetes mellitus 3. Diastolic dysfunction 4. Mediastinal adenopathy - per CT chest in 2006 with PET scan showing very limited metabolic activity; not felt to have a significant neoplastic potential  5. Anemia  Hospital Course: 78 y/o M with hx of CAD, LV dysfunction came in to the office as a work-in visit 12/20/11 for left-sided chest tightness with exertion that goes away with rest occurring more regularly. He also has been SOB with little exertion and had to prop up on two pillows. He reported taking his medicines except the morning of visit. He was hypertensive with BP 192/88 in the office, HR77, EKG with NSR with more inferolateral T wave changes on that day's tracing. Toprol was increased. It was felt he would require cardiac cath and he was thus brought in 3/26 for this procedure. This demonstrated double-vessel CAD s/p 3V CABG with 2/3 patent bypass grafts. His anatomy was felt stable - his vein graft to the diagonal is occluded & this is presumed to have supplied a relatively small portion of myocardium per cath note. He had 2nd degree AV block during catheterization, with report of dyspnea at that time. Dr. Clifton James recommended medical management. Beta blocker was stopped due to AV block and he was observed on telemetry without evidence for higher  grade HB (Mobitz 1 was observed), did have brief 6 beat run of NSVT. Hydralazine was added for HTN. CXR was checked given hx of pleural effusions but only showed question of small effusions without any evidence of convincing pulmonary edema. Lasix was resumed today for his dyspnea. He is feeling better today. He is not hypoxic, tachycardic or tachypnic. His Cr has bumped, felt secondary to contrast nephropathy and will be followed tomorrow. Cr pre-procedure was 1.89, is 2.34 today. Dr. [name] has seen and examined him today and feels he is stable for discharge, but that he will need a repeat BMET tomorrow with follow-up with him in 2-4 weeks.  I discussed the case with Dr. [name] since we are not in the office tomorrow. We will hold his Cozaar for now, continue Lasix, and the on-call staff will be informed to expect the BMET results for tomorrow. The patient will be instructed to go to the [facility] tomorrow for [employer].  Discharge Vitals: Blood pressure 142/83, pulse 83, temperature 97.1 F (36.2 C), temperature source Oral, resp. rate 18, height 5\' 11"  (1.803 m), weight 207 lb 8 oz (94.121 kg), SpO2 98.00%.  Labs: Lab Results  Component Value Date   WBC 4.9 12/23/2011   HGB 10.6* 12/23/2011   HCT 32.0* 12/23/2011   MCV 87.0 12/23/2011   PLT 147* 12/23/2011     Lab 12/23/11 0535  NA 141  K 3.9  CL 104  CO2 24  BUN 30*  CREATININE 2.34*  CALCIUM 9.2  PROT --  BILITOT --  ALKPHOS --  ALT --  AST --  GLUCOSE 118*    Lab Results  Component Value Date   CHOL 108 12/22/2011   HDL 36* 12/22/2011   LDLCALC 60 12/22/2011   TRIG 58 12/22/2011   Diagnostic Studies/Procedures   1. Dg Chest 2 View 12/22/2011  *RADIOLOGY REPORT*  Clinical Data: Post catheterization  CHEST - 2 VIEW  Comparison: 12/20/2011  Findings: Cardiomediastinal silhouette is stable. Status post CABG. Again noted mild interstitial prominence bilaterally without convincing pulmonary edema.  Stable thickening  of the right minor fissure.  Question bilateral small pleural effusion with basilar atelectasis.  Stable mild compression deformity upper lumbar spine.  IMPRESSION: Status post CABG.  Again noted mild interstitial prominence bilaterally without convincing pulmonary edema.  Stable thickening of the right minor fissure.  Question bilateral small pleural effusion with basilar atelectasis.  Stable mild compression deformity upper lumbar spine.  Original Report Authenticated By: Natasha Mead, M.D.   2. Cath this admission  Discharge Medications   Medication List  As of 12/23/2011 12:54 PM   STOP taking these medications         losartan 50 MG tablet      metoprolol succinate 25 MG 24 hr tablet         TAKE these medications         aspirin 81 MG EC tablet   Take 1 tablet (81 mg total) by mouth daily.      furosemide 40 MG tablet   Commonly known as: LASIX   take 1 tablet by mouth once daily      glimepiride 2 MG tablet   Commonly known as: AMARYL   Take 1 tablet (2 mg total) by mouth daily before breakfast. Diabetes.      hydrALAZINE 25 MG tablet   Commonly known as: APRESOLINE   Take 1 tablet (25 mg total) by mouth every 8 (eight) hours.      LEVEMIR FLEXPEN 100 UNIT/ML injection   Generic drug: insulin detemir   inject 30 units at bedtime      nitroGLYCERIN 0.4 MG SL tablet   Commonly known as: NITROSTAT   Place 1 tablet (0.4 mg total) under the tongue every 5 (five) minutes x 3 doses as needed for chest pain.      simvastatin 40 MG tablet   Commonly known as: ZOCOR   take 1 tablet by mouth at bedtime            Disposition   The patient will be discharged in stable condition to home. Discharge Orders    Future Appointments: Provider: Department: Dept Phone: Center:   01/10/2012 7:30 AM [facility]  None   01/10/2012 9:00 AM Dyann Kief, PA [facility] [phone] [facility]     Future Orders Please Complete By Expires   Diet -  low sodium heart healthy      Increase activity slowly      Comments:   No driving for 2 days. No lifting over 5 lbs for 1 week. No sexual activity for 1 week. Keep procedure site clean & dry. If you notice increased pain, swelling, bleeding or pus, call/return!  You may shower, but no soaking baths/hot tubs/pools for 1 week.     Discharge instructions      Comments:   Please take lab slip to the [facility] at [facility] to have your bloodwork drawn TOMORROW during business hours, preferably during the morning. This result will be  called to the on-call physician for Dr. Ludwig Clarks office. If there is anything different we need you to do based on the results, you will receive a phone call.     Follow-up Information    Follow up with [facility]. (Your heart ultrasound appointment has been moved - you will have both heart ultrasound & followup with Herma Carson PA-C at Dr. Ludwig Clarks office on 01/10/12.  Arrive at 7:30am.)    Contact information:   [address] [place] [address] [phone]      Follow up with Important Labwork. (Please go to [facility] at [facility] tomorrow (preferably in the morning) for labwork - take lab slip)    Contact information:   [phone]           Duration of Discharge Encounter: Greater than 30 minutes including physician and PA time.  Signed, Ronie Spies PA-C 12/23/2011, 12:54 PM

## 2011-12-23 NOTE — Progress Notes (Signed)
    SUBJECTIVE: Patient denies chest pain or SOB Telemetry reveals Mobitz 1 while asleep.  BP 172/62  Pulse 72  Temp(Src) 97.1 F (36.2 C) (Oral)  Resp 18  Ht 5\' 11"  (1.803 m)  Wt 207 lb 8 oz (94.121 kg)  BMI 28.94 kg/m2  SpO2 98%  Intake/Output Summary (Last 24 hours) at 12/23/11 0734 Last data filed at 12/22/11 2100  Gross per 24 hour  Intake    840 ml  Output      0 ml  Net    840 ml    PHYSICAL EXAM General: Well developed, well nourished, in no acute distress. Alert and oriented x 3.  Neck: No JVD. No masses noted.  Lungs: Clear bilaterally with decreased breath sounds in bases. No wheezes or rhonci noted.  Heart: RRR with no murmurs noted. Abdomen: Bowel sounds are present. Soft, non-tender.  Extremities: Trace lower extremity edema.   LABS: Basic Metabolic Panel:  Basename 12/23/11 0535 12/22/11 0530  NA 141 140  K 3.9 4.0  CL 104 107  CO2 24 24  GLUCOSE 118* 128*  BUN 30* 26*  CREATININE 2.34* 2.07*  CALCIUM 9.2 9.1  MG -- --  PHOS -- --   CBC:  Basename 12/23/11 0535 12/22/11 0530 12/20/11 1233  WBC 4.9 4.5 --  NEUTROABS -- -- 3.0  HGB 10.6* 10.6* --  HCT 32.0* 32.5* --  MCV 87.0 88.8 --  PLT 147* 152 --    Fasting Lipid Panel:  Basename 12/22/11 0530  CHOL 108  HDL 36*  LDLCALC 60  TRIG 58  CHOLHDL 3.0  LDLDIRECT --    Current Meds:    . aspirin  324 mg Oral NOW   Or  . aspirin  300 mg Rectal NOW  . aspirin EC  81 mg Oral Daily  . furosemide  40 mg Oral Daily  . glimepiride  2 mg Oral QAC breakfast  . hydrALAZINE  25 mg Oral Q8H  . insulin detemir  30 Units Subcutaneous QHS  . losartan  50 mg Oral Daily  . simvastatin  40 mg Oral q1800     ASSESSMENT AND PLAN:  1. CAD: cath revealed patent bypass grafts to the LAD and OM. His native RCA is patent. His vein graft to the Diagonal is occluded. No options for percutaneous revascularization. Will continue medical management. Continue ASA and statin.  2. Heart block:  Mobitz  1 while asleep on telemetry. Continue off metoprolol.  3. Chronic renal insufficiency: (Acute on chronic renal failure) - most likely contrast nephropathy; repeat BMET in office in AM  4. HTN: BP elevated with beta blocker on hold. Will continue hydralazine and adjust as outpatient.   5. HLD: Continue statin.  6. Dyspnea: Resume lasix  7. Dispo: DC today; BMET in AM; fu with me 2-4 weeks >30 min PA and physician time D2 Olga Millers  3/28/20137:34 AM

## 2011-12-23 NOTE — Discharge Summary (Signed)
See progress notes Niko Penson  

## 2011-12-27 ENCOUNTER — Ambulatory Visit (INDEPENDENT_AMBULATORY_CARE_PROVIDER_SITE_OTHER): Payer: Medicare Other | Admitting: *Deleted

## 2011-12-27 DIAGNOSIS — I10 Essential (primary) hypertension: Secondary | ICD-10-CM

## 2011-12-27 LAB — BASIC METABOLIC PANEL
Calcium: 9.6 mg/dL (ref 8.4–10.5)
GFR: 33.42 mL/min — ABNORMAL LOW (ref >60.00)
Glucose, Bld: 187 mg/dL — ABNORMAL HIGH (ref 70–99)
Potassium: 4.2 mEq/L (ref 3.5–5.1)
Sodium: 141 mEq/L (ref 135–145)

## 2011-12-28 ENCOUNTER — Other Ambulatory Visit: Payer: Self-pay | Admitting: Internal Medicine

## 2011-12-29 ENCOUNTER — Other Ambulatory Visit (HOSPITAL_COMMUNITY): Payer: PRIVATE HEALTH INSURANCE

## 2011-12-31 ENCOUNTER — Telehealth: Payer: Self-pay | Admitting: Cardiology

## 2011-12-31 DIAGNOSIS — N289 Disorder of kidney and ureter, unspecified: Secondary | ICD-10-CM

## 2011-12-31 NOTE — Telephone Encounter (Signed)
Fu call Patient was calling about test results

## 2011-12-31 NOTE — Telephone Encounter (Signed)
Spoke with pt, aware of lab work and med change. Will recheck labs Friday.

## 2012-01-07 ENCOUNTER — Ambulatory Visit (INDEPENDENT_AMBULATORY_CARE_PROVIDER_SITE_OTHER): Payer: Medicare Other | Admitting: *Deleted

## 2012-01-07 DIAGNOSIS — N289 Disorder of kidney and ureter, unspecified: Secondary | ICD-10-CM

## 2012-01-07 LAB — BASIC METABOLIC PANEL
CO2: 25 mEq/L (ref 19–32)
Chloride: 104 mEq/L (ref 96–112)
Creatinine, Ser: 2.1 mg/dL — ABNORMAL HIGH (ref 0.4–1.5)
Glucose, Bld: 126 mg/dL — ABNORMAL HIGH (ref 70–99)
Sodium: 137 mEq/L (ref 135–145)

## 2012-01-10 ENCOUNTER — Other Ambulatory Visit: Payer: Self-pay

## 2012-01-10 ENCOUNTER — Ambulatory Visit (HOSPITAL_COMMUNITY): Payer: Medicare Other | Attending: Nurse Practitioner

## 2012-01-10 ENCOUNTER — Ambulatory Visit (INDEPENDENT_AMBULATORY_CARE_PROVIDER_SITE_OTHER): Payer: Medicare Other | Admitting: Physician Assistant

## 2012-01-10 ENCOUNTER — Encounter: Payer: Self-pay | Admitting: Physician Assistant

## 2012-01-10 DIAGNOSIS — I10 Essential (primary) hypertension: Secondary | ICD-10-CM

## 2012-01-10 DIAGNOSIS — I2510 Atherosclerotic heart disease of native coronary artery without angina pectoris: Secondary | ICD-10-CM | POA: Insufficient documentation

## 2012-01-10 DIAGNOSIS — R072 Precordial pain: Secondary | ICD-10-CM | POA: Insufficient documentation

## 2012-01-10 DIAGNOSIS — R0609 Other forms of dyspnea: Secondary | ICD-10-CM | POA: Insufficient documentation

## 2012-01-10 DIAGNOSIS — I441 Atrioventricular block, second degree: Secondary | ICD-10-CM

## 2012-01-10 DIAGNOSIS — I059 Rheumatic mitral valve disease, unspecified: Secondary | ICD-10-CM | POA: Insufficient documentation

## 2012-01-10 DIAGNOSIS — R079 Chest pain, unspecified: Secondary | ICD-10-CM

## 2012-01-10 DIAGNOSIS — I2589 Other forms of chronic ischemic heart disease: Secondary | ICD-10-CM | POA: Insufficient documentation

## 2012-01-10 DIAGNOSIS — R0600 Dyspnea, unspecified: Secondary | ICD-10-CM

## 2012-01-10 DIAGNOSIS — Z8679 Personal history of other diseases of the circulatory system: Secondary | ICD-10-CM

## 2012-01-10 DIAGNOSIS — E119 Type 2 diabetes mellitus without complications: Secondary | ICD-10-CM | POA: Insufficient documentation

## 2012-01-10 DIAGNOSIS — Z87891 Personal history of nicotine dependence: Secondary | ICD-10-CM | POA: Insufficient documentation

## 2012-01-10 DIAGNOSIS — R0989 Other specified symptoms and signs involving the circulatory and respiratory systems: Secondary | ICD-10-CM | POA: Insufficient documentation

## 2012-01-10 DIAGNOSIS — D649 Anemia, unspecified: Secondary | ICD-10-CM

## 2012-01-10 LAB — BASIC METABOLIC PANEL
BUN: 21 mg/dL (ref 6–23)
Creatinine, Ser: 2.1 mg/dL — ABNORMAL HIGH (ref 0.4–1.5)
GFR: 39.8 mL/min — ABNORMAL LOW (ref >60.00)
Glucose, Bld: 61 mg/dL — ABNORMAL LOW (ref 70–99)

## 2012-01-10 LAB — CBC WITH DIFFERENTIAL/PLATELET
Basophils Relative: 0.2 % (ref 0.0–3.0)
Eosinophils Absolute: 0 10*3/uL (ref 0.0–0.7)
Eosinophils Relative: 0.6 % (ref 0.0–5.0)
HCT: 34.2 % — ABNORMAL LOW (ref 39.0–52.0)
Hemoglobin: 11.1 g/dL — ABNORMAL LOW (ref 13.0–17.0)
MCHC: 32.4 g/dL (ref 30.0–36.0)
MCV: 90.7 fl (ref 78.0–100.0)
Monocytes Absolute: 0.4 10*3/uL (ref 0.1–1.0)
Neutro Abs: 2.5 10*3/uL (ref 1.4–7.7)
RBC: 3.77 Mil/uL — ABNORMAL LOW (ref 4.22–5.81)
WBC: 5.3 10*3/uL (ref 4.5–10.5)

## 2012-01-10 MED ORDER — ISOSORBIDE MONONITRATE ER 30 MG PO TB24: 30.0000 mg | ORAL_TABLET | Freq: Every day | ORAL | Status: DC

## 2012-01-10 NOTE — Progress Notes (Signed)
HPI:  This is a 78 year old African American male patient who recently underwent cardiac catheterization per recurrent chest pain and dyspnea. He also had an episode of 6 beat of V. Tach that was treated with beta blocker. He then developed second-degree AV block during the calf and his beta blocker was held. He is now back on beta blocker.  The patient's catheter revealed patent bypass graft to the LAD and OM. His native RCA is patent. His vein graft to the diagonal is occluded. There are no options for percutaneous revascularization. Medical therapy was recommended.  The patient comes in today still having mild chest tightness associated with dyspnea with little activity. He says if he bends over to tie his shoes he would get out of breath. His blood pressure is also elevated today. Looking over his labs he is anemic with a hemoglobin of 10.6 and the hospital. He denies any GI complaints or melena.  No Known Allergies  Current Outpatient Prescriptions on File Prior to Visit  Medication Sig Dispense Refill  . aspirin EC 81 MG EC tablet Take 1 tablet (81 mg total) by mouth daily.      . furosemide (LASIX) 40 MG tablet 1/2 TABLET DAILY  30 tablet  1  . glimepiride (AMARYL) 2 MG tablet Take 1 tablet (2 mg total) by mouth daily before breakfast. Diabetes.  30 tablet  0  . hydrALAZINE (APRESOLINE) 25 MG tablet Take 1 tablet (25 mg total) by mouth every 8 (eight) hours.  90 tablet  6  . LEVEMIR FLEXPEN 100 UNIT/ML injection inject 30 units at bedtime  15 Syringe  5  . nitroGLYCERIN (NITROSTAT) 0.4 MG SL tablet Place 1 tablet (0.4 mg total) under the tongue every 5 (five) minutes x 3 doses as needed for chest pain.  25 tablet  4  . simvastatin (ZOCOR) 40 MG tablet take 1 tablet by mouth at bedtime  30 tablet  3    Past Medical History  Diagnosis Date  . CAD (coronary artery disease)     S/P CABG in 2006. stable anatomy by cath 12/21/11 with 2/3 patent bypass grafts - for med rx. Not on BB 2/2  second-degree AVB.  . LV dysfunction     Per Myoview in 2008; Ef 46% with prior inferior and apical infarct, no ischemia  . Cerebrovascular disease   . HLD (hyperlipidemia)   . Diastolic dysfunction   . Mediastinal adenopathy     per CT chest in 2006 with PET scan showing very limited metabolic activity; not felt to have a significant neoplastic potential  . Anemia   . Hypertension   . Shortness of breath   . Diabetes mellitus     insulin dependent   . GERD (gastroesophageal reflux disease)   . AV block, Mobitz 1     Noted 11/2011 in hospital, BB stopped    Past Surgical History  Procedure Date  . Coronary artery bypass graft 2006    SVG to OM2, SVG to LAD, SVG to DX; (the LIMA did not have good flow and therefore was not used)  . Appendectomy     No family history on file.  History   Social History  . Marital Status: Widowed    Spouse Name: N/A    Number of Children: N/A  . Years of Education: N/A   Occupational History  . Not on file.   Social History Main Topics  . Smoking status: Former Smoker    Quit date: 09/27/1953  .  Smokeless tobacco: Never Used  . Alcohol Use: No  . Drug Use: No  . Sexually Active: No   Other Topics Concern  . Not on file   Social History Narrative  . No narrative on file    ROS:see history of present illness otherwise negative   PHYSICAL EXAM: Well-nournished, in no acute distress. Neck: No JVD, HJR, Bruit, or thyroid enlargement Lungs: No tachypnea, clear without wheezing, rales, or rhonchi Cardiovascular: RRR, PMI not displaced, heart sounds normal, no murmurs, gallops, bruit, thrill, or heave. Abdomen: BS normal. Soft without organomegaly, masses, lesions or tenderness. Extremities: right carotid without hematoma or hemorrhage, lower extremities without cyanosis, clubbing . Good distal pulses bilateral, trace of edema in right ankle SKin: Warm, no lesions or rashes  Musculoskeletal: No deformities Neuro: no focal  signs  BP 160/88  Pulse 76  Ht 5\' 11"  (1.803 m)  Wt 213 lb (96.616 kg)  BMI 29.71 kg/m2   ZOX:WRUEAV sinus rhythm with non-specific intraventricular block, T-wave inversion laterally

## 2012-01-10 NOTE — Patient Instructions (Addendum)
Your physician recommends that you schedule a follow-up appointment in: 1 month with Dr. [name].   Start taking Imdur 30mg  daily.  Follow up with Dr. Debby Bud for an anemia work-up.  Use hemmocult cards as directed.  Labs today:  Cbc, bmet, serum iron, TIBC

## 2012-01-10 NOTE — Assessment & Plan Note (Signed)
Patient continues to have chest pain dyspnea on exertion. Cardiac catheter revealed vein graft to the diagonal was occluded but there was no option for percutaneous intervention. He had patent grafts to the LAD and OM in his native RCA was patent. I will add Imdur for his chest pain. He is also anemic and I believe this may be contributing to his chest pain and dyspnea on exertion.

## 2012-01-10 NOTE — Assessment & Plan Note (Signed)
Patient's blood pressure is elevated today. Hopefully the Imdur or breakdown.

## 2012-01-12 ENCOUNTER — Telehealth: Payer: Self-pay | Admitting: *Deleted

## 2012-01-12 DIAGNOSIS — E611 Iron deficiency: Secondary | ICD-10-CM

## 2012-01-12 MED ORDER — FERROUS SULFATE 325 (65 FE) MG PO TABS: 325.0000 mg | ORAL_TABLET | Freq: Three times a day (TID) | ORAL | Status: DC

## 2012-01-12 NOTE — Telephone Encounter (Signed)
Message copied by Burnell Blanks on Wed Jan 12, 2012  1:23 PM ------      Message from: Prescott Gum      Created: Wed Jan 12, 2012  8:25 AM       Anemia a bit better, but iron deficient. Begin Iron sulfate 325mg  tid with food and f/u with primary MD for anemia workup

## 2012-01-12 NOTE — Telephone Encounter (Signed)
Advised patient and will forward to Dr Debby Bud, has appointment 4/30

## 2012-01-25 ENCOUNTER — Encounter: Payer: Self-pay | Admitting: Internal Medicine

## 2012-01-25 ENCOUNTER — Ambulatory Visit (INDEPENDENT_AMBULATORY_CARE_PROVIDER_SITE_OTHER): Payer: Medicare Other | Admitting: Internal Medicine

## 2012-01-25 ENCOUNTER — Other Ambulatory Visit (INDEPENDENT_AMBULATORY_CARE_PROVIDER_SITE_OTHER): Payer: Medicare Other

## 2012-01-25 VITALS — BP 152/60 | HR 80 | Temp 98.2°F | Resp 16 | Wt 211.0 lb

## 2012-01-25 DIAGNOSIS — E119 Type 2 diabetes mellitus without complications: Secondary | ICD-10-CM

## 2012-01-25 DIAGNOSIS — I10 Essential (primary) hypertension: Secondary | ICD-10-CM

## 2012-01-25 DIAGNOSIS — D509 Iron deficiency anemia, unspecified: Secondary | ICD-10-CM | POA: Insufficient documentation

## 2012-01-25 DIAGNOSIS — M67919 Unspecified disorder of synovium and tendon, unspecified shoulder: Secondary | ICD-10-CM

## 2012-01-25 DIAGNOSIS — M7552 Bursitis of left shoulder: Secondary | ICD-10-CM

## 2012-01-25 LAB — COMPREHENSIVE METABOLIC PANEL
ALT: 28 U/L (ref 0–53)
AST: 27 U/L (ref 0–37)
Albumin: 3.6 g/dL (ref 3.5–5.2)
Alkaline Phosphatase: 96 U/L (ref 39–117)
BUN: 25 mg/dL — ABNORMAL HIGH (ref 6–23)
CO2: 24 mEq/L (ref 19–32)
Calcium: 9.3 mg/dL (ref 8.4–10.5)
Chloride: 104 mEq/L (ref 96–112)
Creatinine, Ser: 2.1 mg/dL — ABNORMAL HIGH (ref 0.4–1.5)
GFR: 39.79 mL/min — ABNORMAL LOW (ref >60.00)
Glucose, Bld: 207 mg/dL — ABNORMAL HIGH (ref 70–99)
Potassium: 4.6 mEq/L (ref 3.5–5.1)
Sodium: 137 mEq/L (ref 135–145)
Total Bilirubin: 0.6 mg/dL (ref 0.3–1.2)
Total Protein: 7.2 g/dL (ref 6.0–8.3)

## 2012-01-25 LAB — HEMOGLOBIN A1C: Hgb A1c MFr Bld: 8.1 % — ABNORMAL HIGH (ref 4.6–6.5)

## 2012-01-25 MED ORDER — GLIMEPIRIDE 2 MG PO TABS: 2.0000 mg | ORAL_TABLET | Freq: Every day | ORAL | Status: DC

## 2012-01-25 MED ORDER — LITE TOUCH LANCETS MISC: 1.0000 | Freq: Every day | Status: DC

## 2012-01-25 MED ORDER — METHYLPREDNISOLONE ACETATE 80 MG/ML IJ SUSP
80.0000 mg | Freq: Once | INTRAMUSCULAR | Status: AC
  Administered 2012-01-25: 80 mg via INTRAMUSCULAR

## 2012-01-25 NOTE — Progress Notes (Signed)
Subjective:    Patient ID: [name], male    DOB: Nov 16, 1930, 78 y.o.   MRN: [id number]  HPI [name] was hospitalized March 26-28 for chest pain - records reviewed. He underwent cardiac cath - patent grafts except for the OM graft. He was for medical therapy. During that stay he was found to have an anemia with a Hgb 10.6 gr. No further work-up was done. Since d/c he has done pretty well. He was seen in f/u April 15th - for his chest discomfort he was started in Imdur 30 mg daily. He did have repeat Hgb now to 11, Iron 30. He had an episode of anemia in '08 with low iron sat at that time. He denies any signs of bleeding, no weight loss, night sweats or other sign of potential  Malignancy. He reports a normal bowel habit with no changes.  He c/o left shoulder pain and decreased ROM that has been very bothersome for 3 weeks. He does not report any precipitating injury or strain.  Past Medical History  Diagnosis Date  . CAD (coronary artery disease)     S/P CABG in 2006. stable anatomy by cath 12/21/11 with 2/3 patent bypass grafts - for med rx. Not on BB 2/2 second-degree AVB.  . LV dysfunction     Per Myoview in 2008; Ef 46% with prior inferior and apical infarct, no ischemia  . Cerebrovascular disease   . HLD (hyperlipidemia)   . Diastolic dysfunction   . Mediastinal adenopathy     per CT chest in 2006 with PET scan showing very limited metabolic activity; not felt to have a significant neoplastic potential  . Anemia   . Hypertension   . Shortness of breath   . Diabetes mellitus     insulin dependent   . GERD (gastroesophageal reflux disease)   . AV block, Mobitz 1     Noted 11/2011 in hospital, BB stopped   Past Surgical History  Procedure Date  . Coronary artery bypass graft 2006    SVG to OM2, SVG to LAD, SVG to DX; (the LIMA did not have good flow and therefore was not used)  . Appendectomy    No family history on file. History   Social History  . Marital Status:  Widowed    Spouse Name: N/A    Number of Children: N/A  . Years of Education: N/A   Occupational History  . Not on file.   Social History Main Topics  . Smoking status: Former Smoker    Quit date: 09/27/1953  . Smokeless tobacco: Never Used  . Alcohol Use: No  . Drug Use: No  . Sexually Active: No   Other Topics Concern  . Not on file   Social History Narrative  . No narrative on file    Current Outpatient Prescriptions on File Prior to Visit  Medication Sig Dispense Refill  . aspirin EC 81 MG EC tablet Take 1 tablet (81 mg total) by mouth daily.      . ferrous sulfate 325 (65 FE) MG tablet Take 1 tablet (325 mg total) by mouth 3 (three) times daily with meals.  90 tablet  3  . furosemide (LASIX) 40 MG tablet 1/2 TABLET DAILY  30 tablet  1  . glimepiride (AMARYL) 2 MG tablet Take 1 tablet (2 mg total) by mouth daily before breakfast. Diabetes.  30 tablet  11  . hydrALAZINE (APRESOLINE) 25 MG tablet Take 1 tablet (25 mg total) by mouth  every 8 (eight) hours.  90 tablet  6  . isosorbide mononitrate (IMDUR) 30 MG 24 hr tablet Take 1 tablet (30 mg total) by mouth daily.  30 tablet  11  . LEVEMIR FLEXPEN 100 UNIT/ML injection inject 30 units at bedtime  15 Syringe  5  . losartan (COZAAR) 50 MG tablet Take 50 mg by mouth Daily.      . metoprolol succinate (TOPROL-XL) 25 MG 24 hr tablet Take 25 mg by mouth Daily.      . nitroGLYCERIN (NITROSTAT) 0.4 MG SL tablet Place 1 tablet (0.4 mg total) under the tongue every 5 (five) minutes x 3 doses as needed for chest pain.  25 tablet  4  . ONE TOUCH ULTRA TEST test strip 1 each by Other route as needed.       . simvastatin (ZOCOR) 40 MG tablet take 1 tablet by mouth at bedtime  30 tablet  3   No current facility-administered medications on file prior to visit.      Review of Systems System review is negative for any constitutional, cardiac, pulmonary, GI or neuro symptoms or complaints other than as described in the HPI.       Objective:   Physical Exam Filed Vitals:   01/25/12 1048  BP: 152/60  Pulse: 80  Temp: 98.2 F (36.8 C)  Resp: 16   Wt Readings from Last 3 Encounters:  01/25/12 211 lb (95.709 kg)  01/10/12 213 lb (96.616 kg)  12/22/11 207 lb 8 oz (94.121 kg)   Gen'l- WNWD AA man in no distress HEENT - C&S clear Cor - RRR Pulm normal respirations Ext - left shoulder with decreased ROM actively and passively. Mild crepitus is noted, no click or lock. Pain with adduction against resistance from 90 degrees and up.  Procedure Joint injection-left shoulder  Indication - localized pain Consent - informed verbal consent from patient after explanation of risks of bleeding and infection Prep - injection site identified, prepped with betadine followed by alcohol. Med -    80  Mg depomedrol with  0.5  Cc 2% xylocain Injection - joint space entered easily. Injected without difficulty. Patient tolerated this well. Post-procedure - patient with rapid reduction in discomfort. Bandaid applied. Routine precautions provided including instruction to return for fever, drainage or increased pain  Lab Results  Component Value Date   WBC 5.3 01/10/2012   HGB 11.1* 01/10/2012   HCT 34.2* 01/10/2012   PLT 148.0* 01/10/2012   GLUCOSE 207* 01/25/2012   CHOL 108 12/22/2011   TRIG 58 12/22/2011   HDL 36* 12/22/2011   LDLCALC 60 12/22/2011   ALT 28 01/25/2012   AST 27 01/25/2012   NA 137 01/25/2012   K 4.6 01/25/2012   CL 104 01/25/2012   CREATININE 2.1* 01/25/2012   BUN 25* 01/25/2012   CO2 24 01/25/2012   INR 1.16 12/20/2011   HGBA1C 8.1* 01/25/2012         Assessment & Plan:  Bursitis shoulder,left - pain with movement and decreased ROM.  Plan - cortisone injection - patient had good relief.           Gentle ROM exercise

## 2012-01-25 NOTE — Patient Instructions (Signed)
Iron deficiency anemia - will need to see GI doctor to evaluate the stomach and colon.  Tinnitus - nerve damage related hearing change: ringing or roaring in the head. Often no cause determined. Plan - use of a "white Noise" generator to distract your hearing at night.   Iron Deficiency Anemia There are many types of anemia. Iron deficiency anemia is the most common. Iron deficiency anemia is a decrease in the number of red blood cells caused by too little iron. Without enough iron, your body does not produce enough hemoglobin. Hemoglobin is a substance in red blood cells that carries oxygen to the body's tissues. Iron deficiency anemia may leave you tired and short of breath. CAUSES    Lack of iron in the diet.   This may be seen in infants and children, because there is little iron in milk.   This may be seen in adults who do not eat enough iron-rich foods.   This may be seen in pregnant or breastfeeding women who do not take iron supplements. There is a much higher need for iron intake at these times.   Poor absorption of iron, as seen with intestinal disorders.   Intestinal bleeding.   Heavy periods.  SYMPTOMS   Mild anemia may not be noticeable. Symptoms may include:  Fatigue.   Headache.   Pale skin.   Weakness.   Shortness of breath.   Dizziness.   Cold hands and feet.   Fast or irregular heartbeat.  DIAGNOSIS   Diagnosis requires a thorough evaluation and physical exam by your caregiver.  Blood tests are generally used to confirm iron deficiency anemia.   Additional tests may be done to find the underlying cause of your anemia. These may include:   Testing for blood in the stool (fecal occult blood test).   A procedure to see inside the colon and rectum (colonoscopy).   A procedure to see inside the esophagus and stomach (endoscopy).  TREATMENT    Correcting the cause of the iron deficiency is the first step.   Medicines, such as oral contraceptives, can  make heavy menstrual flows lighter.   Antibiotics and other medicines can be used to treat peptic ulcers.   Surgery may be needed to remove a bleeding polyp, tumor, or fibroid.   Often, iron supplements (ferrous sulfate) are taken.   For the best iron absorption, take these supplements with an empty stomach.   You may need to take the supplements with food if you cannot tolerate them on an empty stomach. Vitamin C improves the absorption of iron. Your caregiver may recommend taking your iron tablets with a glass of orange juice or vitamin C supplement.   Milk and antacids should not be taken at the same time as iron supplements. They may interfere with the absorption of iron.   Iron supplements can cause constipation. A stool softener is often recommended.   Pregnant and breastfeeding women will need to take extra iron, because their normal diet usually will not provide the required amount.   Patients who cannot tolerate iron by mouth can take it through a vein (intravenously) or by an injection into the muscle.  HOME CARE INSTRUCTIONS    Ask your dietitian for help with diet questions.   Take iron and vitamins as directed by your caregiver.   Eat a diet rich in iron. Eat liver, lean beef, whole-grain bread, eggs, dried fruit, and dark green leafy vegetables.  SEEK IMMEDIATE MEDICAL CARE IF:  You have a fainting episode. Do not drive yourself. Call your local emergency services (911 in [place] if no other help is available.   You have chest pain, nausea, or vomiting.   You develop severe or increased shortness of breath with activities.   You develop weakness or increased thirst.   You have a rapid heartbeat.   You develop unexplained sweating or become lightheaded when getting up from a chair or bed.  MAKE SURE YOU:    Understand these instructions.   Will watch your condition.   Will get help right away if you are not doing well or get worse.  Document Released:  09/10/2000 Document Revised: 09/02/2011 Document Reviewed: 01/20/2010 [facility] Patient Information 2012 [place], [place].   Tinnitus Sounds you hear in your ears and coming from within the ear is called tinnitus. This can be a symptom of many ear disorders. It is often associated with hearing loss.   Tinnitus can be seen with:  Infections.   Ear blockages such as wax buildup.   Meniere's disease.   Ear damage.   Inherited.   Occupational causes.  While irritating, it is not usually a threat to health. When the cause of the tinnitus is wax, infection in the middle ear, or foreign body it is easily treated. Hearing loss will usually be reversible.   TREATMENT   When treating the underlying cause does not get rid of tinnitus, it may be necessary to get rid of the unwanted sound by covering it up with more pleasant background noises. This may include music, the radio etc. There are tinnitus maskers which can be worn which produce background noise to cover up the tinnitus. Avoid all medications which tend to make tinnitus worse such as alcohol, caffeine, aspirin, and nicotine. There are many soothing background tapes such as rain, ocean, thunderstorms, etc. These soothing sounds help with sleeping or resting. Keep all follow-up appointments and referrals. This is important to identify the cause of the problem. It also helps avoid complications, impaired hearing, disability, or chronic pain. Document Released: 09/13/2005 Document Revised: 09/02/2011 Document Reviewed: 05/01/2008 [facility] Patient Information 2012 [place], [place].

## 2012-01-26 NOTE — Assessment & Plan Note (Signed)
Patient is due for lab follow-up.  Addendum:  Lab Results  Component Value Date   HGBA1C 8.1* 01/25/2012   Above goal - but in a complex elderly patient will not make a change in  Treatment regimen at this time.

## 2012-01-26 NOTE — Assessment & Plan Note (Signed)
BP Readings from Last 3 Encounters:  01/25/12 152/60  01/10/12 160/88  12/23/11 165/82   Suboptimal control. Will address at next office visit

## 2012-01-26 NOTE — Assessment & Plan Note (Signed)
Patient with iron deficiency anemia. He did have a similar problem in '08 at which time his Hgb was low, Fe was low and Retic was normal  Plan - Anemia panel           referral to GI for endoscopy

## 2012-01-28 ENCOUNTER — Encounter: Payer: Self-pay | Admitting: Internal Medicine

## 2012-02-10 ENCOUNTER — Ambulatory Visit (INDEPENDENT_AMBULATORY_CARE_PROVIDER_SITE_OTHER): Payer: Medicare Other | Admitting: Cardiology

## 2012-02-10 ENCOUNTER — Encounter: Payer: Self-pay | Admitting: Cardiology

## 2012-02-10 ENCOUNTER — Telehealth: Payer: Self-pay

## 2012-02-10 DIAGNOSIS — I10 Essential (primary) hypertension: Secondary | ICD-10-CM

## 2012-02-10 DIAGNOSIS — I6529 Occlusion and stenosis of unspecified carotid artery: Secondary | ICD-10-CM

## 2012-02-10 DIAGNOSIS — I2589 Other forms of chronic ischemic heart disease: Secondary | ICD-10-CM

## 2012-02-10 DIAGNOSIS — Z8679 Personal history of other diseases of the circulatory system: Secondary | ICD-10-CM

## 2012-02-10 DIAGNOSIS — E785 Hyperlipidemia, unspecified: Secondary | ICD-10-CM

## 2012-02-10 NOTE — Progress Notes (Signed)
HPI: [name] is a gentleman who has a history of coronary artery disease, status post coronary artery bypass graft in 2006.  His most recent Myoview was performed in August of 2011.  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  He also has cerebrovascular disease. Last carotid Dopplers were performed in April of 2011 and showed a 40-59% bilateral stenosis. Followup was recommended in one year. Last echocardiogram in April of 2013 showed an ejection fraction of 60-65% and mild mitral regurgitation. Cardiac catheterization in March of 2013 showed an occluded LAD. The mid and distal LAD filled from the vein graft. The apical LAD had a 99% stenosis which was unchanged. The distal circumflex was occluded prior to the second marginal. The second marginal filled via the vein graft. The right coronary artery had a proximal 25%, mid 40% and distal 25% lesion. Saphenous vein graft to the diagonal was occluded. Medical therapy recommended. Since he was last seen, he has a URI but otherwise no dyspnea, chest pain, palpitations or syncope.  Current Outpatient Prescriptions  Medication Sig Dispense Refill  . aspirin EC 81 MG EC tablet Take 1 tablet (81 mg total) by mouth daily.      . ferrous sulfate 325 (65 FE) MG tablet Take 1 tablet (325 mg total) by mouth 3 (three) times daily with meals.  90 tablet  3  . furosemide (LASIX) 40 MG tablet Take 40 mg by mouth daily.   30 tablet  1  . glimepiride (AMARYL) 2 MG tablet Take 1 tablet (2 mg total) by mouth daily before breakfast. Diabetes.  30 tablet  11  . hydrALAZINE (APRESOLINE) 25 MG tablet Take 1 tablet (25 mg total) by mouth every 8 (eight) hours.  90 tablet  6  . isosorbide mononitrate (IMDUR) 30 MG 24 hr tablet Take 1 tablet (30 mg total) by mouth daily.  30 tablet  11  . LEVEMIR FLEXPEN 100 UNIT/ML injection inject 30 units at bedtime  15 Syringe  5  . Lite Touch Lancets MISC Inject 1  Device into the skin daily.  30 each  11  . losartan (COZAAR) 50 MG tablet Take 50 mg by mouth Daily.      . metoprolol succinate (TOPROL-XL) 25 MG 24 hr tablet Take 25 mg by mouth Daily.      . nitroGLYCERIN (NITROSTAT) 0.4 MG SL tablet Place 1 tablet (0.4 mg total) under the tongue every 5 (five) minutes x 3 doses as needed for chest pain.  25 tablet  4  . ONE TOUCH ULTRA TEST test strip 1 each by Other route as needed.       . simvastatin (ZOCOR) 40 MG tablet take 1 tablet by mouth at bedtime  30 tablet  3     Past Medical History  Diagnosis Date  . CAD (coronary artery disease)     S/P CABG in 2006. stable anatomy by cath 12/21/11 with 2/3 patent bypass grafts - for med rx. Not on BB 2/2 second-degree AVB.  . LV dysfunction     Per Myoview in 2008; Ef 46% with prior inferior and apical infarct, no ischemia  . Cerebrovascular disease   . HLD (hyperlipidemia)   . Diastolic dysfunction   . Mediastinal adenopathy     per CT chest in 2006 with PET scan showing very limited metabolic activity; not felt to have a significant neoplastic potential  . Anemia   .  Hypertension   . Diabetes mellitus     insulin dependent   . GERD (gastroesophageal reflux disease)   . AV block, Mobitz 1     Noted 11/2011 in hospital, BB stopped    Past Surgical History  Procedure Date  . Coronary artery bypass graft 2006    SVG to OM2, SVG to LAD, SVG to DX; (the LIMA did not have good flow and therefore was not used)  . Appendectomy     History   Social History  . Marital Status: Widowed    Spouse Name: N/A    Number of Children: N/A  . Years of Education: N/A   Occupational History  . Not on file.   Social History Main Topics  . Smoking status: Former Smoker    Quit date: 09/27/1953  . Smokeless tobacco: Never Used  . Alcohol Use: No  . Drug Use: No  . Sexually Active: No   Other Topics Concern  . Not on file   Social History Narrative  . No narrative on file    ROS: cold symptoms  but no hemoptysis, dysphasia, odynophagia, melena, hematochezia, dysuria, hematuria, rash, seizure activity, orthopnea, PND, pedal edema, claudication. Remaining systems are negative.  Physical Exam: Well-developed well-nourished in no acute distress.  Skin is warm and dry.  HEENT is normal.  Neck is supple. No thyromegaly.  Chest is clear to auscultation with normal expansion.  Cardiovascular exam is regular rate and rhythm.  Abdominal exam nontender or distended. No masses palpated. Extremities show no edema. neuro grossly intact

## 2012-02-10 NOTE — Assessment & Plan Note (Signed)
Continue present medications. 

## 2012-02-10 NOTE — Telephone Encounter (Signed)
No answer, no VM

## 2012-02-10 NOTE — Assessment & Plan Note (Signed)
Continue aspirin and statin. Schedule followup carotid Dopplers. 

## 2012-02-10 NOTE — Telephone Encounter (Signed)
Pt walked into clinic c/o sneezing, cough (clear sputum), runny nose and occasional sweating. Pt denied fever, chills, ST. I advised pt that he may need an OTC antihistamine but he is requesting advisement from MD only. Please advise, thank you!

## 2012-02-10 NOTE — Assessment & Plan Note (Signed)
Blood pressure mildly elevated. However he has not taken his medication yet today. We will follow and adjust as needed.

## 2012-02-10 NOTE — Assessment & Plan Note (Signed)
Continue aspirin and statin. 

## 2012-02-10 NOTE — Assessment & Plan Note (Signed)
Continue statin. 

## 2012-02-10 NOTE — Patient Instructions (Signed)
Your physician wants you to follow-up in: 6 MONTHS WITH DR CRENSHAW You will receive a reminder letter in the mail two months in advance. If you don't receive a letter, please call our office to schedule the follow-up appointment.   Your physician has requested that you have a carotid duplex. This test is an ultrasound of the carotid arteries in your neck. It looks at blood flow through these arteries that supply the brain with blood. Allow one hour for this exam. There are no restrictions or special instructions.   

## 2012-02-10 NOTE — Telephone Encounter (Signed)
OK for generic claritin once a day; robitussin DM 1 tsp every 6 hours, tylenol as needed for fever or sweats.

## 2012-02-15 NOTE — Telephone Encounter (Signed)
I have left several messages for this pt with no return call. Closing phone note until further contact from this pt. 

## 2012-02-19 ENCOUNTER — Other Ambulatory Visit: Payer: Self-pay | Admitting: Cardiology

## 2012-02-22 ENCOUNTER — Encounter (INDEPENDENT_AMBULATORY_CARE_PROVIDER_SITE_OTHER): Payer: Medicare Other

## 2012-02-22 DIAGNOSIS — I6529 Occlusion and stenosis of unspecified carotid artery: Secondary | ICD-10-CM

## 2012-02-28 ENCOUNTER — Ambulatory Visit (INDEPENDENT_AMBULATORY_CARE_PROVIDER_SITE_OTHER): Payer: Medicare Other | Admitting: Internal Medicine

## 2012-02-28 ENCOUNTER — Encounter: Payer: Self-pay | Admitting: Internal Medicine

## 2012-02-28 VITALS — BP 142/80 | HR 87 | Temp 98.5°F | Resp 16 | Wt 208.0 lb

## 2012-02-28 DIAGNOSIS — R05 Cough: Secondary | ICD-10-CM

## 2012-02-28 DIAGNOSIS — D509 Iron deficiency anemia, unspecified: Secondary | ICD-10-CM

## 2012-02-28 DIAGNOSIS — R059 Cough, unspecified: Secondary | ICD-10-CM

## 2012-02-28 MED ORDER — AZITHROMYCIN 250 MG PO TABS: ORAL_TABLET | ORAL | Status: AC

## 2012-02-28 MED ORDER — PROMETHAZINE-CODEINE 6.25-10 MG/5ML PO SYRP: 5.0000 mL | ORAL_SOLUTION | ORAL | Status: DC | PRN

## 2012-02-28 NOTE — Patient Instructions (Signed)
Diabetes - continue with your present medication; watch your diet.  Blood pressure - 140/82 today. Please take your medication every day.  Cough and cold - take azithromycin as directed; use phenergan with codeine 1-2 tsp at bedtime for nighttime cough; during the day take generic sugar free robitussin DM (pharmacist can help you find this.  Call if the cough doesn't get better.  Upper Respiratory Infection, Adult An upper respiratory infection (URI) is also sometimes known as the common cold. The upper respiratory tract includes the nose, sinuses, throat, trachea, and bronchi. Bronchi are the airways leading to the lungs. Most people improve within 1 week, but symptoms can last up to 2 weeks. A residual cough may last even longer.   CAUSES Many different viruses can infect the tissues lining the upper respiratory tract. The tissues become irritated and inflamed and often become very moist. Mucus production is also common. A cold is contagious. You can easily spread the virus to others by oral contact. This includes kissing, sharing a glass, coughing, or sneezing. Touching your mouth or nose and then touching a surface, which is then touched by another person, can also spread the virus. SYMPTOMS   Symptoms typically develop 1 to 3 days after you come in contact with a cold virus. Symptoms vary from person to person. They may include:  Runny nose.   Sneezing.   Nasal congestion.   Sinus irritation.   Sore throat.   Loss of voice (laryngitis).   Cough.   Fatigue.   Muscle aches.   Loss of appetite.   Headache.   Low-grade fever.  DIAGNOSIS   You might diagnose your own cold based on familiar symptoms, since most people get a cold 2 to 3 times a year. Your caregiver can confirm this based on your exam. Most importantly, your caregiver can check that your symptoms are not due to another disease such as strep throat, sinusitis, pneumonia, asthma, or epiglottitis. Blood tests, throat  tests, and X-rays are not necessary to diagnose a common cold, but they may sometimes be helpful in excluding other more serious diseases. Your caregiver will decide if any further tests are required. RISKS AND COMPLICATIONS   You may be at risk for a more severe case of the common cold if you smoke cigarettes, have chronic heart disease (such as heart failure) or lung disease (such as asthma), or if you have a weakened immune system. The very young and very old are also at risk for more serious infections. Bacterial sinusitis, middle ear infections, and bacterial pneumonia can complicate the common cold. The common cold can worsen asthma and chronic obstructive pulmonary disease (COPD). Sometimes, these complications can require emergency medical care and may be life-threatening. PREVENTION   The best way to protect against getting a cold is to practice good hygiene. Avoid oral or hand contact with people with cold symptoms. Wash your hands often if contact occurs. There is no clear evidence that vitamin C, vitamin E, echinacea, or exercise reduces the chance of developing a cold. However, it is always recommended to get plenty of rest and practice good nutrition. TREATMENT   Treatment is directed at relieving symptoms. There is no cure. Antibiotics are not effective, because the infection is caused by a virus, not by bacteria. Treatment may include:  Increased fluid intake. Sports drinks offer valuable electrolytes, sugars, and fluids.   Breathing heated mist or steam (vaporizer or shower).   Eating chicken soup or other clear broths, and maintaining good  nutrition.   Getting plenty of rest.   Using gargles or lozenges for comfort.   Controlling fevers with ibuprofen or acetaminophen as directed by your caregiver.   Increasing usage of your inhaler if you have asthma.  Zinc gel and zinc lozenges, taken in the first 24 hours of the common cold, can shorten the duration and lessen the severity  of symptoms. Pain medicines may help with fever, muscle aches, and throat pain. A variety of non-prescription medicines are available to treat congestion and runny nose. Your caregiver can make recommendations and may suggest nasal or lung inhalers for other symptoms.   HOME CARE INSTRUCTIONS    Only take over-the-counter or prescription medicines for pain, discomfort, or fever as directed by your caregiver.   Use a warm mist humidifier or inhale steam from a shower to increase air moisture. This may keep secretions moist and make it easier to breathe.   Drink enough water and fluids to keep your urine clear or pale yellow.   Rest as needed.   Return to work when your temperature has returned to normal or as your caregiver advises. You may need to stay home longer to avoid infecting others. You can also use a face mask and careful hand washing to prevent spread of the virus.  SEEK MEDICAL CARE IF:    After the first few days, you feel you are getting worse rather than better.   You need your caregiver's advice about medicines to control symptoms.   You develop chills, worsening shortness of breath, or brown or red sputum. These may be signs of pneumonia.   You develop yellow or brown nasal discharge or pain in the face, especially when you bend forward. These may be signs of sinusitis.   You develop a fever, swollen neck glands, pain with swallowing, or white areas in the back of your throat. These may be signs of strep throat.  SEEK IMMEDIATE MEDICAL CARE IF:    You have a fever.   You develop severe or persistent headache, ear pain, sinus pain, or chest pain.   You develop wheezing, a prolonged cough, cough up blood, or have a change in your usual mucus (if you have chronic lung disease).   You develop sore muscles or a stiff neck.  Document Released: 03/09/2001 Document Revised: 09/02/2011 Document Reviewed: 01/15/2011 [facility] Patient Information 2012 [place], [place].

## 2012-02-28 NOTE — Assessment & Plan Note (Signed)
Check on referral to GI - he evidently has an appointment June 11th. [facility] will notify patient of this appointment.

## 2012-02-28 NOTE — Progress Notes (Signed)
Subjective:    Patient ID: [name], male    DOB: 1931-03-17, 78 y.o.   MRN: [id number]  HPI Mr. Abbey walks in with a h/o cough since may 16th. Several attempts were made to call him back with no answer. He continues to have a cough, scant to zero sputum production. He has not had any documented fever. He has not been short of breath.   Diabertes- last A1C 8.1%  He has been continued on the levemir and glimeperide. He has had no low blood sugar events. He does follow a good diet. In the elderly have avoided overly aggressive care to prevent hypoglycemia.  Anemia - last Hgb 11.0 g  Iron was 30 (L). GI referral requested but has not yet been done.   Past Medical History  Diagnosis Date  . CAD (coronary artery disease)     S/P CABG in 2006. stable anatomy by cath 12/21/11 with 2/3 patent bypass grafts - for med rx. Not on BB 2/2 second-degree AVB.  . LV dysfunction     Per Myoview in 2008; Ef 46% with prior inferior and apical infarct, no ischemia  . Cerebrovascular disease   . HLD (hyperlipidemia)   . Diastolic dysfunction   . Mediastinal adenopathy     per CT chest in 2006 with PET scan showing very limited metabolic activity; not felt to have a significant neoplastic potential  . Anemia   . Hypertension   . Diabetes mellitus     insulin dependent   . GERD (gastroesophageal reflux disease)   . AV block, Mobitz 1     Noted 11/2011 in hospital, BB stopped   Past Surgical History  Procedure Date  . Coronary artery bypass graft 2006    SVG to OM2, SVG to LAD, SVG to DX; (the LIMA did not have good flow and therefore was not used)  . Appendectomy    No family history on file. History   Social History  . Marital Status: Widowed    Spouse Name: N/A    Number of Children: N/A  . Years of Education: N/A   Occupational History  . Not on file.   Social History Main Topics  . Smoking status: Former Smoker    Quit date: 09/27/1953  . Smokeless tobacco: Never Used  .  Alcohol Use: No  . Drug Use: No  . Sexually Active: No   Other Topics Concern  . Not on file   Social History Narrative  . No narrative on file       Review of Systems System review is negative for any constitutional, cardiac, pulmonary, GI or neuro symptoms or complaints other than as described in the HPI.     Objective:   Physical Exam Filed Vitals:   02/28/12 1056  BP: 142/80  Pulse: 87  Temp: 98.5 F (36.9 C)  Resp: 16   Wt Readings from Last 3 Encounters:  02/28/12 208 lb (94.348 kg)  02/10/12 206 lb (93.441 kg)  01/25/12 211 lb (95.709 kg)   Gen'l- WNWD AA man who appears to be in robust health HEENT - no sinus tenderness, TMs normal Nodes- negative Pulm - normal respirations, no rales or wheezes, no increased WOB Cor - RRR       Assessment & Plan:  Cough - prolonged now for 2+ weeks.  Plan - azithromycin 250 as directed            Phenergan w/ codiene 2 tsp qHS; during the day generic robitussin  DM

## 2012-03-06 ENCOUNTER — Other Ambulatory Visit: Payer: Self-pay | Admitting: Internal Medicine

## 2012-03-06 NOTE — Telephone Encounter (Signed)
Rx refill on furosemide sent to [facility]

## 2012-03-07 ENCOUNTER — Ambulatory Visit (INDEPENDENT_AMBULATORY_CARE_PROVIDER_SITE_OTHER): Payer: Medicare Other | Admitting: Internal Medicine

## 2012-03-07 ENCOUNTER — Encounter: Payer: Self-pay | Admitting: Internal Medicine

## 2012-03-07 VITALS — BP 142/70 | HR 60 | Ht 71.0 in | Wt 208.2 lb

## 2012-03-07 DIAGNOSIS — I2510 Atherosclerotic heart disease of native coronary artery without angina pectoris: Secondary | ICD-10-CM

## 2012-03-07 DIAGNOSIS — D509 Iron deficiency anemia, unspecified: Secondary | ICD-10-CM

## 2012-03-07 DIAGNOSIS — Z800 Family history of malignant neoplasm of digestive organs: Secondary | ICD-10-CM

## 2012-03-07 DIAGNOSIS — Z86010 Personal history of colonic polyps: Secondary | ICD-10-CM

## 2012-03-07 DIAGNOSIS — E119 Type 2 diabetes mellitus without complications: Secondary | ICD-10-CM

## 2012-03-07 MED ORDER — MOVIPREP 100 G PO SOLR: 1.0000 | Freq: Once | ORAL | Status: DC

## 2012-03-07 NOTE — Patient Instructions (Signed)
You have been scheduled for an endoscopy and colonoscopy with propofol. Please follow the written instructions given to you at your visit today. Please pick up your prep at the pharmacy within the next 1-3 days.  

## 2012-03-07 NOTE — Progress Notes (Signed)
HISTORY OF PRESENT ILLNESS:  [name] is a 78 y.o. male with multiple significant medical problems including hypertension, insulin requiring diabetes mellitus, hyperlipidemia, cerebrovascular disease, and coronary artery disease status post coronary artery bypass grafting. The patient was hospitalized in March of 2013 (records reviewed) for chest pain. Workup included cardiac catheterization. Medical therapy was deemed appropriate. He was noted to be anemic with a hemoglobin of 10.6. Low iron levels. Of importance, the patient underwent colonoscopy in April 2005. He was found to have multiple adenomatous. The preparation was fair. Followup in one year recommended. He received a recall letter but did not respond. He was also noted to have diverticulosis. Despite anemia, he denies fatigue. His GI review of systems has been negative including dysphagia, abdominal pain, weight loss, melena, or hematochezia. He has been placed on iron therapy, which has turned his stools dark. His sister has a history of stomach cancer.  REVIEW OF SYSTEMS:  All non-GI ROS negative except for arthralgias  Past Medical History  Diagnosis Date  . CAD (coronary artery disease)     S/P CABG in 2006. stable anatomy by cath 12/21/11 with 2/3 patent bypass grafts - for med rx. Not on BB 2/2 second-degree AVB.  . LV dysfunction     Per Myoview in 2008; Ef 46% with prior inferior and apical infarct, no ischemia  . Cerebrovascular disease   . HLD (hyperlipidemia)   . Diastolic dysfunction   . Mediastinal adenopathy     per CT chest in 2006 with PET scan showing very limited metabolic activity; not felt to have a significant neoplastic potential  . Anemia   . Hypertension   . Diabetes mellitus     insulin dependent   . GERD (gastroesophageal reflux disease)   . AV block, Mobitz 1     Noted 11/2011 in hospital, BB stopped  . Colon polyps   . Diverticulosis     Past Surgical History  Procedure Date  . Coronary  artery bypass graft 2006    SVG to OM2, SVG to LAD, SVG to DX; (the LIMA did not have good flow and therefore was not used)  . Appendectomy     Social History [name]  reports that he quit smoking about 58 years ago. He has never used smokeless tobacco. He reports that he does not drink alcohol or use illicit drugs.  family history includes Lung cancer in his brother; Pancreatic cancer in his brother; and Stomach cancer in his sister.  No Known Allergies     PHYSICAL EXAMINATION: Vital signs: BP 142/70  Pulse 60  Ht 5\' 11"  (1.803 m)  Wt 208 lb 3.2 oz (94.439 kg)  BMI 29.04 kg/m2  Constitutional:obese, generally well-appearing, no acute distress Psychiatric: alert and oriented x3, cooperative Eyes: extraocular movements intact, anicteric, conjunctiva pink Mouth: oral pharynx moist, no lesions Neck: supple no lymphadenopathy Cardiovascular: heart regular rate and rhythm, no murmur Lungs: clear to auscultation bilaterally Abdomen: soft,obese, nontender, nondistended, no obvious ascites, no peritoneal signs, normal bowel sounds, no organomegaly Rectal:deferred until colonoscopy Extremities: no lower extremity edema bilaterally Skin: no lesions on visible extremities Neuro: No focal deficits. No asterixis.     ASSESSMENT:  #1. Anemia, possibly iron deficient. #2. History of multiple adenomatous colon polyps. Last colonoscopy 2005 with fair prep. Overdue for surveillance #3. Family history of stomach cancer #4. Patient with multiple significant medical problems as outlined above, including coronary artery disease and insulin requiring diabetes   PLAN:  #1. Colonoscopy to  evaluate possible iron deficiency anemia and provide neoplasia surveillance.The nature of the procedure, as well as the risks, benefits, and alternatives were carefully and thoroughly reviewed with the patient. Ample time for discussion and questions allowed. The patient understood, was satisfied, and  agreed to proceed. The patient is high-risk given his comorbidities. CRNA monitored propofol for sedation recommended #2. Movi prep prescribed. The patient instructed on its use #3. Upper endoscopy to evaluate possible iron deficiency anemia in a patient with a family history of gastric cancer.The nature of the procedure, as well as the risks, benefits, and alternatives were carefully and thoroughly reviewed with the patient. Ample time for discussion and questions allowed. The patient understood, was satisfied, and agreed to proceed.  #4. Adjust insulin for the procedure. He will take one half of his evening insulin the evening prior to the procedures. We will monitor his blood sugars immediately before and after his examinations #5. Hold iron one week prior to the examinations in order to maximize preparation #6. Ongoing general medical care per Dr. Debby Bud

## 2012-03-24 ENCOUNTER — Other Ambulatory Visit: Payer: Self-pay | Admitting: *Deleted

## 2012-03-24 MED ORDER — INSULIN PEN NEEDLE 30G X 8 MM MISC: Status: DC

## 2012-04-10 ENCOUNTER — Ambulatory Visit (AMBULATORY_SURGERY_CENTER): Payer: Medicare Other | Admitting: Internal Medicine

## 2012-04-10 ENCOUNTER — Encounter: Payer: Self-pay | Admitting: Internal Medicine

## 2012-04-10 VITALS — BP 194/102 | HR 70 | Temp 97.9°F | Resp 20 | Ht 71.0 in | Wt 208.0 lb

## 2012-04-10 DIAGNOSIS — D126 Benign neoplasm of colon, unspecified: Secondary | ICD-10-CM

## 2012-04-10 DIAGNOSIS — Z86010 Personal history of colonic polyps: Secondary | ICD-10-CM

## 2012-04-10 DIAGNOSIS — D509 Iron deficiency anemia, unspecified: Secondary | ICD-10-CM

## 2012-04-10 DIAGNOSIS — Z1211 Encounter for screening for malignant neoplasm of colon: Secondary | ICD-10-CM

## 2012-04-10 DIAGNOSIS — K2900 Acute gastritis without bleeding: Secondary | ICD-10-CM

## 2012-04-10 LAB — GLUCOSE, CAPILLARY: Glucose-Capillary: 107 mg/dL — ABNORMAL HIGH (ref 70–99)

## 2012-04-10 MED ORDER — DEXTROSE 5 % IV SOLN: INTRAVENOUS | Status: DC

## 2012-04-10 MED ORDER — SODIUM CHLORIDE 0.9 % IV SOLN: 500.0000 mL | INTRAVENOUS | Status: DC

## 2012-04-10 NOTE — Progress Notes (Signed)
Patient did not have preoperative order for IV antibiotic SSI prophylaxis. (G8918)  Patient did not experience any of the following events: a burn prior to discharge; a fall within the facility; wrong site/side/patient/procedure/implant event; or a hospital transfer or hospital admission upon discharge from the facility. (G8907)  

## 2012-04-10 NOTE — Op Note (Signed)
[facility] [address] [place], [place]  [address]  COLONOSCOPY PROCEDURE REPORT  PATIENT:  [name], [name]  MR#:  [id number] BIRTHDATE:  Feb 04, 1931, 81 yrs. old  GENDER:  male ENDOSCOPIST:  Wilhemina Bonito. Eda Keys, MD REF. BY:  Office PROCEDURE DATE:  04/10/2012 PROCEDURE:  Colonoscopy with snare polypectomy x 2 ASA CLASS:  Class III INDICATIONS:  history of pre-cancerous (adenomatous) colon polyps, surveillance and high-risk screening, Iron deficiency anemia ; index exam 12-2003 (mult adenomas, fair prep) - overdue for f/u MEDICATIONS:   MAC sedation, administered by CRNA, propofol (Diprivan) 160 mg IV  DESCRIPTION OF PROCEDURE:   After the risks benefits and alternatives of the procedure were thoroughly explained, informed consent was obtained.  Digital rectal exam was performed and revealed no abnormalities.   The LB CF-H180AL [id number] endoscope was introduced through the anus and advanced to the cecum, which was identified by both the appendix and ileocecal valve, without limitations.  The quality of the prep was excellent, using MoviPrep.  The instrument was then slowly withdrawn as the colon was fully examined. <<PROCEDUREIMAGES>>  FINDINGS:  Two 5mm  polyps were found in the ascending  and transverse colon. Polyps were snared without cautery. Retrieval was successful. Moderate diverticulosis was found in the left colon.  Otherwise normal colonoscopy without other polyps, masses, vascular ectasias, or inflammatory changes.   Retroflexed views in the rectum revealed internal hemorrhoids.    The time to cecum = 8:11  minutes. The scope was then withdrawn in 11:14  minutes from the cecum and the procedure completed.  COMPLICATIONS:  None ENDOSCOPIC IMPRESSION: 1) Two polyps in the  colon - removed 2) Moderate diverticulosis in the left colon 3) Otherwise normal colonoscopy 4) Internal hemorrhoids  RECOMMENDATIONS: 1) EGD today 2)  Return to the care of your  primary provider. GI follow up as needed  ______________________________ Wilhemina Bonito. Eda Keys, MD  CC:  Jacques Navy, MD;  The Patient  n. eSIGNED:   Wilhemina Bonito. Eda Keys at 04/10/2012 03:59 PM  Meda Coffee, [phone]

## 2012-04-10 NOTE — Progress Notes (Signed)
25ml D50 given iv per Alease Frame RN

## 2012-04-10 NOTE — Patient Instructions (Addendum)
YOU HAD AN ENDOSCOPIC PROCEDURE TODAY AT THE [facility]: Refer to the procedure report that was given to you for any specific questions about what was found during the examination.  If the procedure report does not answer your questions, please call your gastroenterologist to clarify.  If you requested that your care partner not be given the details of your procedure findings, then the procedure report has been included in a sealed envelope for you to review at your convenience later.  YOU SHOULD EXPECT: Some feelings of bloating in the abdomen. Passage of more gas than usual.  Walking can help get rid of the air that was put into your GI tract during the procedure and reduce the bloating. If you had a lower endoscopy (such as a colonoscopy or flexible sigmoidoscopy) you may notice spotting of blood in your stool or on the toilet paper. If you underwent a bowel prep for your procedure, then you may not have a normal bowel movement for a few days.  DIET: Your first meal following the procedure should be a light meal and then it is ok to progress to your normal diet.  A half-sandwich or bowl of soup is an example of a good first meal.  Heavy or fried foods are harder to digest and may make you feel nauseous or bloated.  Likewise meals heavy in dairy and vegetables can cause extra gas to form and this can also increase the bloating.  Drink plenty of fluids but you should avoid alcoholic beverages for 24 hours.  ACTIVITY: Your care partner should take you home directly after the procedure.  You should plan to take it easy, moving slowly for the rest of the day.  You can resume normal activity the day after the procedure however you should NOT DRIVE or use heavy machinery for 24 hours (because of the sedation medicines used during the test).    SYMPTOMS TO REPORT IMMEDIATELY: A gastroenterologist can be reached at any hour.  During normal business hours, 8:30 AM to 5:00 PM Monday through Friday,  call [phone].  After hours and on weekends, please call the GI answering service at [phone] who will take a message and have the physician on call contact you.   Following lower endoscopy (colonoscopy or flexible sigmoidoscopy):  Excessive amounts of blood in the stool  Significant tenderness or worsening of abdominal pains  Swelling of the abdomen that is new, acute  Fever of 100F or higher  Following upper endoscopy (EGD)  Vomiting of blood or coffee ground material  New chest pain or pain under the shoulder blades  Painful or persistently difficult swallowing  New shortness of breath  Fever of 100F or higher  Black, tarry-looking stools  FOLLOW UP: If any biopsies were taken you will be contacted by phone or by letter within the next 1-3 weeks.  Call your gastroenterologist if you have not heard about the biopsies in 3 weeks.  Our staff will call the home number listed on your records the next business day following your procedure to check on you and address any questions or concerns that you may have at that time regarding the information given to you following your procedure. This is a courtesy call and so if there is no answer at the home number and we have not heard from you through the emergency physician on call, we will assume that you have returned to your regular daily activities without incident.  SIGNATURES/CONFIDENTIALITY: You and/or your care   partner have signed paperwork which will be entered into your electronic medical record.  These signatures attest to the fact that that the information above on your After Visit Summary has been reviewed and is understood.  Full responsibility of the confidentiality of this discharge information lies with you and/or your care-partner.  

## 2012-04-10 NOTE — Op Note (Signed)
[facility] [address] [place], [place]  [address]  ENDOSCOPY PROCEDURE REPORT  PATIENT:  [name], [name]  MR#:  [id number] BIRTHDATE:  1931-06-11, 81 yrs. old  GENDER:  male  ENDOSCOPIST:  Wilhemina Bonito. Eda Keys, MD Referred by:  Office  PROCEDURE DATE:  04/10/2012 PROCEDURE:  EGD with biopsy, 78[address] ASA CLASS:  Class III INDICATIONS:  iron deficiency anemia  MEDICATIONS:   MAC sedation, administered by CRNA, propofol (Diprivan) 40 mg IV TOPICAL ANESTHETIC:  none  DESCRIPTION OF PROCEDURE:   After the risks benefits and alternatives of the procedure were thoroughly explained, informed consent was obtained.  The LB GIF-H180 [id number] endoscope was introduced through the mouth and advanced to the second portion of the duodenum, without limitations.  The instrument was slowly withdrawn as the mucosa was fully examined. <<PROCEDUREIMAGES>>  Mild Esophagitis (edema / friable) was found at the gastroesophageal junction.  Moderate gastritis (erosions)was found in the antrum. CLO bx taken.  Otherwise the examination was normal to D2.    Retroflexed views revealed a small hiatal hernia.    The scope was then withdrawn from the patient and the procedure completed.  COMPLICATIONS:  None  ENDOSCOPIC IMPRESSION: 1) Esophagitis at the gastroesophageal junction 2) Moderate gastritis in the antrum 3) Otherwise normal examination  RECOMMENDATIONS: 1) Rx CLO if positive 2) PRILOSEC OTC 20MG  DAILY  ______________________________ Wilhemina Bonito. Eda Keys, MD  CC:  Jacques Navy, MD; The Patient  n. eSIGNED:   Wilhemina Bonito. Eda Keys at 04/10/2012 04:04 PM  Meda Coffee, [phone]

## 2012-04-10 NOTE — Progress Notes (Signed)
CBG 178 IVF's changed to NS

## 2012-04-11 ENCOUNTER — Telehealth: Payer: Self-pay | Admitting: *Deleted

## 2012-04-11 LAB — HELICOBACTER PYLORI SCREEN-BIOPSY: UREASE: NEGATIVE

## 2012-04-11 NOTE — Telephone Encounter (Signed)
  Follow up Call-  Call back number 04/10/2012  Post procedure Call Back phone  # [phone]  Permission to leave phone message Yes     Patient questions:  Do you have a fever, pain , or abdominal swelling? no Pain Score  0 *  Have you tolerated food without any problems? yes  Have you been able to return to your normal activities? yes  Do you have any questions about your discharge instructions: Diet   no Medications  no Follow up visit  no  Do you have questions or concerns about your Care? no  Actions: * If pain score is 4 or above: No action needed, pain <4.

## 2012-04-19 ENCOUNTER — Encounter: Payer: Self-pay | Admitting: Internal Medicine

## 2012-05-31 ENCOUNTER — Other Ambulatory Visit: Payer: Self-pay | Admitting: Cardiology

## 2012-07-05 ENCOUNTER — Other Ambulatory Visit: Payer: Self-pay | Admitting: Cardiology

## 2012-07-05 ENCOUNTER — Other Ambulatory Visit: Payer: Self-pay | Admitting: *Deleted

## 2012-07-05 MED ORDER — SIMVASTATIN 40 MG PO TABS: 40.0000 mg | ORAL_TABLET | Freq: Every day | ORAL | Status: DC

## 2012-07-05 MED ORDER — METOPROLOL SUCCINATE ER 25 MG PO TB24: 25.0000 mg | ORAL_TABLET | Freq: Every day | ORAL | Status: DC

## 2012-07-05 NOTE — Telephone Encounter (Signed)
Refilled Metoprolol and simvastatin.

## 2012-07-24 ENCOUNTER — Inpatient Hospital Stay (HOSPITAL_COMMUNITY): Payer: Medicare Other

## 2012-07-24 ENCOUNTER — Encounter: Payer: Self-pay | Admitting: Cardiology

## 2012-07-24 ENCOUNTER — Inpatient Hospital Stay (HOSPITAL_COMMUNITY)
Admission: AD | Admit: 2012-07-24 | Discharge: 2012-08-20 | DRG: 286 | Disposition: A | Discharge status: Home / Self Care | Payer: Medicare Other | Source: Ambulatory Visit | Attending: Cardiology | Admitting: Cardiology

## 2012-07-24 ENCOUNTER — Encounter (HOSPITAL_COMMUNITY): Payer: Self-pay | Admitting: *Deleted

## 2012-07-24 ENCOUNTER — Ambulatory Visit (INDEPENDENT_AMBULATORY_CARE_PROVIDER_SITE_OTHER): Payer: Medicare Other | Admitting: Cardiology

## 2012-07-24 VITALS — BP 174/96 | HR 73 | Wt 218.0 lb

## 2012-07-24 DIAGNOSIS — R0989 Other specified symptoms and signs involving the circulatory and respiratory systems: Secondary | ICD-10-CM

## 2012-07-24 DIAGNOSIS — Z800 Family history of malignant neoplasm of digestive organs: Secondary | ICD-10-CM

## 2012-07-24 DIAGNOSIS — E1149 Type 2 diabetes mellitus with other diabetic neurological complication: Secondary | ICD-10-CM

## 2012-07-24 DIAGNOSIS — I679 Cerebrovascular disease, unspecified: Secondary | ICD-10-CM | POA: Diagnosis present

## 2012-07-24 DIAGNOSIS — I2510 Atherosclerotic heart disease of native coronary artery without angina pectoris: Secondary | ICD-10-CM

## 2012-07-24 DIAGNOSIS — I25810 Atherosclerosis of coronary artery bypass graft(s) without angina pectoris: Secondary | ICD-10-CM | POA: Diagnosis present

## 2012-07-24 DIAGNOSIS — Z79899 Other long term (current) drug therapy: Secondary | ICD-10-CM

## 2012-07-24 DIAGNOSIS — N184 Chronic kidney disease, stage 4 (severe): Secondary | ICD-10-CM | POA: Diagnosis present

## 2012-07-24 DIAGNOSIS — I10 Essential (primary) hypertension: Secondary | ICD-10-CM

## 2012-07-24 DIAGNOSIS — E785 Hyperlipidemia, unspecified: Secondary | ICD-10-CM

## 2012-07-24 DIAGNOSIS — E876 Hypokalemia: Secondary | ICD-10-CM | POA: Diagnosis present

## 2012-07-24 DIAGNOSIS — I2789 Other specified pulmonary heart diseases: Secondary | ICD-10-CM | POA: Diagnosis present

## 2012-07-24 DIAGNOSIS — N179 Acute kidney failure, unspecified: Secondary | ICD-10-CM | POA: Diagnosis present

## 2012-07-24 DIAGNOSIS — Z801 Family history of malignant neoplasm of trachea, bronchus and lung: Secondary | ICD-10-CM

## 2012-07-24 DIAGNOSIS — I130 Hypertensive heart and chronic kidney disease with heart failure and stage 1 through stage 4 chronic kidney disease, or unspecified chronic kidney disease: Principal | ICD-10-CM | POA: Diagnosis present

## 2012-07-24 DIAGNOSIS — Z8679 Personal history of other diseases of the circulatory system: Secondary | ICD-10-CM

## 2012-07-24 DIAGNOSIS — R0600 Dyspnea, unspecified: Secondary | ICD-10-CM

## 2012-07-24 DIAGNOSIS — E119 Type 2 diabetes mellitus without complications: Secondary | ICD-10-CM

## 2012-07-24 DIAGNOSIS — E1169 Type 2 diabetes mellitus with other specified complication: Secondary | ICD-10-CM | POA: Diagnosis present

## 2012-07-24 DIAGNOSIS — Z87891 Personal history of nicotine dependence: Secondary | ICD-10-CM

## 2012-07-24 DIAGNOSIS — R079 Chest pain, unspecified: Secondary | ICD-10-CM

## 2012-07-24 DIAGNOSIS — E871 Hypo-osmolality and hyponatremia: Secondary | ICD-10-CM | POA: Diagnosis present

## 2012-07-24 DIAGNOSIS — E118 Type 2 diabetes mellitus with unspecified complications: Secondary | ICD-10-CM | POA: Diagnosis present

## 2012-07-24 DIAGNOSIS — D638 Anemia in other chronic diseases classified elsewhere: Secondary | ICD-10-CM | POA: Diagnosis present

## 2012-07-24 DIAGNOSIS — E162 Hypoglycemia, unspecified: Secondary | ICD-10-CM

## 2012-07-24 DIAGNOSIS — D649 Anemia, unspecified: Secondary | ICD-10-CM

## 2012-07-24 DIAGNOSIS — Z794 Long term (current) use of insulin: Secondary | ICD-10-CM

## 2012-07-24 DIAGNOSIS — K219 Gastro-esophageal reflux disease without esophagitis: Secondary | ICD-10-CM | POA: Diagnosis present

## 2012-07-24 DIAGNOSIS — N289 Disorder of kidney and ureter, unspecified: Secondary | ICD-10-CM

## 2012-07-24 DIAGNOSIS — I5033 Acute on chronic diastolic (congestive) heart failure: Secondary | ICD-10-CM

## 2012-07-24 DIAGNOSIS — I2589 Other forms of chronic ischemic heart disease: Secondary | ICD-10-CM

## 2012-07-24 DIAGNOSIS — E8809 Other disorders of plasma-protein metabolism, not elsewhere classified: Secondary | ICD-10-CM | POA: Diagnosis present

## 2012-07-24 DIAGNOSIS — I509 Heart failure, unspecified: Secondary | ICD-10-CM | POA: Diagnosis present

## 2012-07-24 DIAGNOSIS — N2581 Secondary hyperparathyroidism of renal origin: Secondary | ICD-10-CM | POA: Diagnosis present

## 2012-07-24 DIAGNOSIS — Z7982 Long term (current) use of aspirin: Secondary | ICD-10-CM

## 2012-07-24 DIAGNOSIS — D509 Iron deficiency anemia, unspecified: Secondary | ICD-10-CM | POA: Diagnosis present

## 2012-07-24 DIAGNOSIS — K5900 Constipation, unspecified: Secondary | ICD-10-CM | POA: Diagnosis present

## 2012-07-24 DIAGNOSIS — I441 Atrioventricular block, second degree: Secondary | ICD-10-CM | POA: Diagnosis present

## 2012-07-24 DIAGNOSIS — N189 Chronic kidney disease, unspecified: Secondary | ICD-10-CM

## 2012-07-24 DIAGNOSIS — I2721 Secondary pulmonary arterial hypertension: Secondary | ICD-10-CM

## 2012-07-24 HISTORY — DX: Secondary hyperparathyroidism of renal origin: N25.81

## 2012-07-24 HISTORY — DX: Secondary pulmonary arterial hypertension: I27.21

## 2012-07-24 HISTORY — DX: Iron deficiency anemia, unspecified: D50.9

## 2012-07-24 LAB — GLUCOSE, CAPILLARY: Glucose-Capillary: 107 mg/dL — ABNORMAL HIGH (ref 70–99)

## 2012-07-24 LAB — BASIC METABOLIC PANEL
CO2: 23 mEq/L (ref 19–32)
Chloride: 104 mEq/L (ref 96–112)
Sodium: 138 mEq/L (ref 135–145)

## 2012-07-24 LAB — CBC
HCT: 31.5 % — ABNORMAL LOW (ref 39.0–52.0)
MCV: 87 fL (ref 78.0–100.0)
RBC: 3.62 MIL/uL — ABNORMAL LOW (ref 4.22–5.81)
WBC: 4.1 10*3/uL (ref 4.0–10.5)

## 2012-07-24 MED ORDER — HYDRALAZINE HCL 20 MG/ML IJ SOLN
10.0000 mg | Freq: Four times a day (QID) | INTRAMUSCULAR | Status: DC | PRN
  Administered 2012-07-25: 10 mg via INTRAVENOUS
  Filled 2012-07-24: qty 0.5

## 2012-07-24 MED ORDER — METOPROLOL SUCCINATE ER 25 MG PO TB24
25.0000 mg | ORAL_TABLET | Freq: Every day | ORAL | Status: DC
  Administered 2012-07-25 – 2012-07-31 (×6): 25 mg via ORAL
  Filled 2012-07-24 (×9): qty 1

## 2012-07-24 MED ORDER — ASPIRIN EC 81 MG PO TBEC
81.0000 mg | DELAYED_RELEASE_TABLET | Freq: Every day | ORAL | Status: DC
  Administered 2012-07-25 – 2012-08-10 (×16): 81 mg via ORAL
  Filled 2012-07-24 (×19): qty 1

## 2012-07-24 MED ORDER — INSULIN ASPART 100 UNIT/ML ~~LOC~~ SOLN
0.0000 [IU] | Freq: Three times a day (TID) | SUBCUTANEOUS | Status: DC
  Administered 2012-07-25 – 2012-07-27 (×5): 2 [IU] via SUBCUTANEOUS
  Administered 2012-07-28 – 2012-08-01 (×10): 3 [IU] via SUBCUTANEOUS
  Administered 2012-08-02: 2 [IU] via SUBCUTANEOUS
  Administered 2012-08-02: 3 [IU] via SUBCUTANEOUS
  Administered 2012-08-02: 2 [IU] via SUBCUTANEOUS
  Administered 2012-08-03: 3 [IU] via SUBCUTANEOUS
  Administered 2012-08-03 (×2): 2 [IU] via SUBCUTANEOUS
  Administered 2012-08-04 (×2): 3 [IU] via SUBCUTANEOUS
  Administered 2012-08-05 (×2): 2 [IU] via SUBCUTANEOUS
  Administered 2012-08-06 (×2): 3 [IU] via SUBCUTANEOUS
  Administered 2012-08-07 – 2012-08-12 (×7): 2 [IU] via SUBCUTANEOUS
  Administered 2012-08-13: 3 [IU] via SUBCUTANEOUS
  Administered 2012-08-13: 2 [IU] via SUBCUTANEOUS
  Administered 2012-08-16: 3 [IU] via SUBCUTANEOUS
  Administered 2012-08-17 – 2012-08-19 (×4): 2 [IU] via SUBCUTANEOUS
  Administered 2012-08-20: 06:00:00 via SUBCUTANEOUS

## 2012-07-24 MED ORDER — SIMVASTATIN 40 MG PO TABS
40.0000 mg | ORAL_TABLET | Freq: Every day | ORAL | Status: DC
  Administered 2012-07-24 – 2012-07-26 (×3): 40 mg via ORAL
  Filled 2012-07-24 (×4): qty 1

## 2012-07-24 MED ORDER — ISOSORBIDE MONONITRATE ER 30 MG PO TB24
30.0000 mg | ORAL_TABLET | Freq: Every day | ORAL | Status: DC
  Administered 2012-07-25 – 2012-07-27 (×3): 30 mg via ORAL
  Filled 2012-07-24 (×5): qty 1

## 2012-07-24 MED ORDER — GLIMEPIRIDE 2 MG PO TABS
2.0000 mg | ORAL_TABLET | Freq: Every day | ORAL | Status: DC
  Filled 2012-07-24 (×2): qty 1

## 2012-07-24 MED ORDER — LOSARTAN POTASSIUM 50 MG PO TABS
50.0000 mg | ORAL_TABLET | Freq: Every day | ORAL | Status: DC
  Administered 2012-07-25 – 2012-07-27 (×3): 50 mg via ORAL
  Filled 2012-07-24 (×5): qty 1

## 2012-07-24 MED ORDER — NITROGLYCERIN 0.4 MG SL SUBL: 0.4000 mg | SUBLINGUAL_TABLET | SUBLINGUAL | Status: DC | PRN

## 2012-07-24 MED ORDER — FUROSEMIDE 40 MG PO TABS
40.0000 mg | ORAL_TABLET | Freq: Two times a day (BID) | ORAL | Status: DC
  Administered 2012-07-24 – 2012-07-27 (×6): 40 mg via ORAL
  Filled 2012-07-24 (×10): qty 1

## 2012-07-24 MED ORDER — ALPRAZOLAM 0.25 MG PO TABS
0.2500 mg | ORAL_TABLET | Freq: Two times a day (BID) | ORAL | Status: DC | PRN
  Administered 2012-08-02 – 2012-08-04 (×3): 0.25 mg via ORAL
  Filled 2012-07-24 (×3): qty 1

## 2012-07-24 MED ORDER — ONDANSETRON HCL 4 MG/2ML IJ SOLN: 4.0000 mg | Freq: Four times a day (QID) | INTRAMUSCULAR | Status: DC | PRN

## 2012-07-24 MED ORDER — INSULIN DETEMIR 100 UNIT/ML ~~LOC~~ SOLN
30.0000 [IU] | Freq: Every day | SUBCUTANEOUS | Status: DC
  Administered 2012-07-24 – 2012-07-25 (×2): 30 [IU] via SUBCUTANEOUS
  Filled 2012-07-24: qty 10

## 2012-07-24 MED ORDER — GLUCOSE 40 % PO GEL: 1.0000 | ORAL | Status: DC | PRN

## 2012-07-24 MED ORDER — DEXTROSE 50 % IV SOLN: 25.0000 mL | Freq: Once | INTRAVENOUS | Status: AC | PRN

## 2012-07-24 MED ORDER — GLUCOSE 40 % PO GEL
ORAL | Status: AC
  Administered 2012-07-24: 15:00:00
  Filled 2012-07-24: qty 1

## 2012-07-24 MED ORDER — ACETAMINOPHEN 325 MG PO TABS: 650.0000 mg | ORAL_TABLET | ORAL | Status: DC | PRN

## 2012-07-24 MED ORDER — HYDRALAZINE HCL 25 MG PO TABS
25.0000 mg | ORAL_TABLET | Freq: Three times a day (TID) | ORAL | Status: DC
  Administered 2012-07-24 – 2012-07-26 (×6): 25 mg via ORAL
  Filled 2012-07-24 (×10): qty 1

## 2012-07-24 MED ORDER — DEXTROSE 50 % IV SOLN
INTRAVENOUS | Status: AC
  Administered 2012-07-24: 15:00:00
  Filled 2012-07-24: qty 50

## 2012-07-24 NOTE — Progress Notes (Signed)
HPI: [name] is a gentleman who has a history of coronary artery disease, status post coronary artery bypass graft in 2006. His most recent Myoview was performed in August of 2011. 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 He also has cerebrovascular disease. Last carotid Dopplers were performed in May of 2013 and showed a 40-59% right and 60-70% left stenosis. Followup was recommended in six months. Last echocardiogram in April of 2013 showed an ejection fraction of 60-65% and mild mitral regurgitation. Cardiac catheterization in March of 2013 showed an occluded LAD. The mid and distal LAD filled from the vein graft. The apical LAD had a 99% stenosis which was unchanged. The distal circumflex was occluded prior to the second marginal. The second marginal filled via the vein graft. The right coronary artery had a proximal 25%, mid 40% and distal 25% lesion. Saphenous vein graft to the diagonal was occluded. Medical therapy recommended. The patient presented to the office today with complaints of chest pain and dyspnea for one week. His pain is in the left chest area and described as a sharp, balling up sensation. He does not radiate. No associated symptoms. It improves with eating and also certain movements. He had a more severe episode this morning lasting approximately 20 minutes. He also complains of increased dyspnea on exertion and orthopnea for approximately one week. He has chronic mild edema in the right lower extremity but no increase in the left. He denies fevers, chills or productive cough.   Current Outpatient Prescriptions  Medication Sig Dispense Refill  . aspirin EC 81 MG EC tablet Take 1 tablet (81 mg total) by mouth daily.      . furosemide (LASIX) 40 MG tablet take 1 tablet by mouth once daily  30 tablet  11  . glimepiride (AMARYL) 2 MG tablet Take 1 tablet (2 mg total) by mouth daily before breakfast. Diabetes.   30 tablet  11  . hydrALAZINE (APRESOLINE) 25 MG tablet Take 1 tablet (25 mg total) by mouth every 8 (eight) hours.  90 tablet  6  . Insulin Pen Needle (NOVOFINE) 30G X 8 MM MISC Use as directed once daily  100 each  3  . isosorbide mononitrate (IMDUR) 30 MG 24 hr tablet Take 1 tablet (30 mg total) by mouth daily.  30 tablet  11  . LEVEMIR FLEXPEN 100 UNIT/ML injection inject 30 units at bedtime  15 Syringe  5  . Lite Touch Lancets MISC Inject 1 Device into the skin daily.  30 each  11  . losartan (COZAAR) 50 MG tablet take 1 tablet by mouth once daily  30 tablet  5  . metoprolol succinate (TOPROL-XL) 25 MG 24 hr tablet take 1 tablet by mouth once daily  30 tablet  3  . nitroGLYCERIN (NITROSTAT) 0.4 MG SL tablet Place 1 tablet (0.4 mg total) under the tongue every 5 (five) minutes x 3 doses as needed for chest pain.  25 tablet  4  . ONE TOUCH ULTRA TEST test strip 1 each by Other route as needed.       . simvastatin (ZOCOR) 40 MG tablet Take 1 tablet (40 mg total) by mouth daily.  30 tablet  3   Current Facility-Administered Medications  Medication Dose Route Frequency Provider Last Rate Last Dose  . DISCONTD: dextrose 5 % solution   Intravenous Continuous Hilarie Fredrickson, MD  Past Medical History  Diagnosis Date  . CAD (coronary artery disease)     S/P CABG in 2006. stable anatomy by cath 12/21/11 with 2/3 patent bypass grafts - for med rx. Not on BB 2/2 second-degree AVB.  . LV dysfunction     Per Myoview in 2008; Ef 46% with prior inferior and apical infarct, no ischemia  . Cerebrovascular disease   . HLD (hyperlipidemia)   . Diastolic dysfunction   . Mediastinal adenopathy     per CT chest in 2006 with PET scan showing very limited metabolic activity; not felt to have a significant neoplastic potential  . Anemia   . Hypertension   . Diabetes mellitus     insulin dependent   . GERD (gastroesophageal reflux disease)   . AV block, Mobitz 1     Noted 11/2011 in hospital, BB  stopped  . Colon polyps   . Diverticulosis     Past Surgical History  Procedure Date  . Coronary artery bypass graft 2006    SVG to OM2, SVG to LAD, SVG to DX; (the LIMA did not have good flow and therefore was not used)  . Appendectomy     History   Social History  . Marital Status: Widowed    Spouse Name: N/A    Number of Children: 12  . Years of Education: N/A   Occupational History  . Retired    Social History Main Topics  . Smoking status: Former Smoker    Quit date: 09/27/1953  . Smokeless tobacco: Never Used  . Alcohol Use: No  . Drug Use: No  . Sexually Active: No   Other Topics Concern  . Not on file   Social History Narrative   10th grade. Married '64-10 yrs/widowed; married '77 - 17yrs/widowed. 7 sons, 5 daughters; 30+ grandchildren, 15 great-grands. 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 His son checks on him all the time.     ROS: no fevers or chills, productive cough, hemoptysis, dysphasia, odynophagia, melena, hematochezia, dysuria, hematuria, rash, seizure activity, orthopnea, PND, pedal edema, claudication. Remaining systems are negative.  Physical Exam: Well-developed well-nourished in no acute distress.  Skin is warm and dry.  HEENT is normal. Normal eyelids Neck is supple. Normal carotid upstroke with no bruits. No jugular venous distention. Chest is clear to auscultation with normal expansion. Previous sternotomy Cardiovascular exam is regular rate and rhythm. 2/6 systolic murmur apex Abdominal exam nontender or distended. No masses palpated. No hepatosplenomegaly Extremities show trace edema on the right. neuro grossly intact  ECG sinus rhythm at a rate of 73. First degree AV block. Lateral T-wave inversion. No significant change compared to April 2013.

## 2012-07-24 NOTE — Assessment & Plan Note (Signed)
Symptoms atypical and electrocardiogram unchanged. Cycle enzymes. If negative no plans for further ischemia evaluation given results of recent catheterization.

## 2012-07-24 NOTE — Assessment & Plan Note (Signed)
Continue aspirin and statin. Followup carotid Dopplers November 2013. 

## 2012-07-24 NOTE — Assessment & Plan Note (Signed)
Continue ARB and beta blocker. 

## 2012-07-24 NOTE — Consult Note (Signed)
Referring Physician: [name]    Chief Complaint: confusion/left facial droop  HPI:                                                                                                                                         [name] is an 78 y.o. male who was initially admitted to hospital for CP.  After admission patient was at baseline until 1430 hours when he was noted to be holding his head in both hands and confused.  Nurse noted he may also have had a facial droop on left.  CBG showed 22.  Patient was given given 3 orange juices along with a tube of glucose gel then administered 1/2 amp D50. Due to continued with slurred speech and ?L facial droop --Code stroke was initiated. At time of arrival his symptoms had significantly improved, he was alert and oriented to person and place but incorrect on the month, able to follow my commands both verbally and visually.  He scores NIHSS 2 and CBG was 107.  Code stroke was canceled and patient was taken for CT head.     LSN: [id number] Given: No: resolution of symptoms  Past Medical History  Diagnosis Date  . CAD (coronary artery disease)     S/P CABG in 2006. stable anatomy by cath 12/21/11 with 2/3 patent bypass grafts - for med rx. Not on BB 2/2 second-degree AVB.  . LV dysfunction     Per Myoview in 2008; Ef 46% with prior inferior and apical infarct, no ischemia  . Cerebrovascular disease   . HLD (hyperlipidemia)   . Diastolic dysfunction   . Mediastinal adenopathy     per CT chest in 2006 with PET scan showing very limited metabolic activity; not felt to have a significant neoplastic potential  . Anemia   . Hypertension   . Diabetes mellitus     insulin dependent   . GERD (gastroesophageal reflux disease)   . AV block, Mobitz 1     Noted 11/2011 in hospital, BB stopped  . Colon polyps   . Diverticulosis     Past Surgical History  Procedure Date  . Coronary artery bypass graft 2006    SVG to OM2, SVG to LAD, SVG to DX; (the LIMA  did not have good flow and therefore was not used)  . Appendectomy     Family History  Problem Relation Age of Onset  . Stomach cancer Sister   . Pancreatic cancer Brother   . Lung cancer Brother   . Colon cancer Neg Hx    Social History:  reports that he quit smoking about 58 years ago. He has never used smokeless tobacco. He reports that he does not drink alcohol or use illicit drugs.  Allergies: No Known Allergies  Medications:  Prior to Admission:  Prescriptions prior to admission  Medication Sig Dispense Refill  . aspirin EC 81 MG EC tablet Take 1 tablet (81 mg total) by mouth daily.      . furosemide (LASIX) 40 MG tablet take 1 tablet by mouth once daily  30 tablet  11  . glimepiride (AMARYL) 2 MG tablet Take 1 tablet (2 mg total) by mouth daily before breakfast. Diabetes.  30 tablet  11  . hydrALAZINE (APRESOLINE) 25 MG tablet Take 1 tablet (25 mg total) by mouth every 8 (eight) hours.  90 tablet  6  . Insulin Pen Needle (NOVOFINE) 30G X 8 MM MISC Use as directed once daily  100 each  3  . isosorbide mononitrate (IMDUR) 30 MG 24 hr tablet Take 1 tablet (30 mg total) by mouth daily.  30 tablet  11  . LEVEMIR FLEXPEN 100 UNIT/ML injection inject 30 units at bedtime  15 Syringe  5  . Lite Touch Lancets MISC Inject 1 Device into the skin daily.  30 each  11  . losartan (COZAAR) 50 MG tablet take 1 tablet by mouth once daily  30 tablet  5  . metoprolol succinate (TOPROL-XL) 25 MG 24 hr tablet take 1 tablet by mouth once daily  30 tablet  3  . nitroGLYCERIN (NITROSTAT) 0.4 MG SL tablet Place 1 tablet (0.4 mg total) under the tongue every 5 (five) minutes x 3 doses as needed for chest pain.  25 tablet  4  . ONE TOUCH ULTRA TEST test strip 1 each by Other route as needed.       . simvastatin (ZOCOR) 40 MG tablet Take 1 tablet (40 mg total) by mouth daily.  30 tablet   3   Scheduled:   . aspirin EC  81 mg Oral Daily  . dextrose      . dextrose      . furosemide  40 mg Oral BID  . glimepiride  2 mg Oral QAC breakfast  . hydrALAZINE  25 mg Oral Q8H  . insulin aspart  0-15 Units Subcutaneous TID WC  . insulin detemir  30 Units Subcutaneous Daily  . isosorbide mononitrate  30 mg Oral Daily  . losartan  50 mg Oral Daily  . metoprolol succinate  25 mg Oral Daily  . simvastatin  40 mg Oral q1800    ROS:                                                                                                                                       History obtained from the patient  General ROS: negative for - chills, fatigue, fever, night sweats, weight gain or weight loss Psychological ROS: negative for - behavioral disorder, hallucinations, memory difficulties, mood swings or suicidal ideation Ophthalmic ROS: negative for - blurry vision, double vision, eye pain or loss of vision ENT ROS: negative for - epistaxis, nasal discharge,  oral lesions, sore throat, tinnitus or vertigo Allergy and Immunology ROS: negative for - hives or itchy/watery eyes Hematological and Lymphatic ROS: negative for - bleeding problems, bruising or swollen lymph nodes Endocrine ROS: negative for - galactorrhea, hair pattern changes, polydipsia/polyuria or temperature intolerance Respiratory ROS: negative for - cough, hemoptysis, shortness of breath or wheezing Cardiovascular ROS: positive for - chest pain,  Gastrointestinal ROS: negative for - abdominal pain, diarrhea, hematemesis, nausea/vomiting or stool incontinence Genito-Urinary ROS: negative for - dysuria, hematuria, incontinence or urinary frequency/urgency Musculoskeletal ROS: negative for - joint swelling or muscular weakness Neurological ROS: as noted in HPI Dermatological ROS: negative for rash and skin lesion changes  Neurologic Examination:                                                                                                       Blood pressure 199/99, pulse 73, temperature 97.9 F (36.6 C), temperature source Oral, resp. rate 16, height 5\' 11"  (1.803 m), weight 98.884 kg (218 lb), SpO2 93.00%.  Mental Status: Alert, oriented to person, place year and at this time month, thought content appropriate.  Speech fluent without evidence of aphasia.  Able to follow 3 step commands without difficulty. Cranial Nerves: II: Discs flat bilaterally; Visual fields grossly normal, pupils equal, round, reactive to light and accommodation III,IV, VI: ptosis not present, extra-ocular motions intact bilaterally V,VII: smile symmetric, facial light touch sensation normal bilaterally VIII: hearing normal bilaterally IX,X: gag reflex present XI: bilateral shoulder shrug XII: midline tongue extension Motor: Right : Upper extremity   5/5    Left:     Upper extremity   5/5 (4/5 with right should abduction--previously injured)  Lower extremity   5/5     Lower extremity   5/5 Tone and bulk:normal tone throughout; no atrophy noted Sensory: Pinprick and light touch intact throughout, bilaterally Deep Tendon Reflexes: 2+ in the upper extremities, trace at the knees and absent at the ankles.   Plantars: Right: mute   Left: toe amputation Cerebellar: normal finger-to-nose bilaterally, slight dysmetria with LLE heel to shin CV: pulses palpable throughout     Results for orders placed during the hospital encounter of 07/24/12 (from the past 48 hour(s))  GLUCOSE, CAPILLARY     Status: Abnormal   Collection Time   07/24/12  2:54 PM      Component Value Range Comment   Glucose-Capillary 22 (*) 70 - 99 mg/dL   GLUCOSE, CAPILLARY     Status: Abnormal   Collection Time   07/24/12  3:11 PM      Component Value Range Comment   Glucose-Capillary 126 (*) 70 - 99 mg/dL   GLUCOSE, CAPILLARY     Status: Abnormal   Collection Time   07/24/12  3:45 PM      Component Value Range Comment   Glucose-Capillary 107 (*) 70 - 99 mg/dL    No  results found.  Patient seen and examined.  Clinical course and management discussed.  Necessary edits performed.  I agree with the above.  Assessment and plan of care developed and  discussed below.    Assessment: 78 y.o. male with presenting complaints of facial droop, confusion and slurred speech.  Blood sugar 22.  Patient improved dramatically with normalization of blood glucose and no further focality noted.  CT of the head was unremarkable.  Infarct unlikely.    Stroke Risk Factors - diabetes mellitus, hyperlipidemia, hypertension and previous CVA  Plan: 1. No further neurologic intervention is recommended at this time.  If further questions arise, please call or page at that time.  Thank you for allowing neurology to participate in the care of this patient.   Thana Farr, MD [facility] [phone]  07/24/2012  5:02 PM

## 2012-07-24 NOTE — Assessment & Plan Note (Signed)
Continue aspirin and statin. 

## 2012-07-24 NOTE — Assessment & Plan Note (Addendum)
Plan admit to telemetry bed. Patient not significantly volume overloaded on exam but symptoms suggestive of diastolic congestive heart failure. Increase Lasix to 40 mg twice a day tonight and in AM and reassess. Check BNP. Check chest x-ray. Follow renal function. No symptoms suggestive of infection.  The patient will be admitted under 24-hour observation status. Hopefully he can be discharged tomorrow morning after diuresis assuming his symptoms improved and his enzymes are negative.

## 2012-07-24 NOTE — Assessment & Plan Note (Signed)
Blood pressure is elevated. Increase Cozaar to 100 mg daily. Follow blood pressure and increase medications as needed.

## 2012-07-24 NOTE — Assessment & Plan Note (Signed)
Continue statin. 

## 2012-07-24 NOTE — Progress Notes (Signed)
Hypoglycemic Event  CBG: 22  Treatment: 15 GM carbohydrate snack, 15 GM gel and D50 IV 25 mL  Symptoms: Sweating, Shaking, Confusion, Irritable  Follow-up CBG: Time:1511 CBG Result:126  Possible Reasons for Event: Inadequate meal intake  Comments/MD notified: Theodore Demark, PA notified. Hurman Horn, PA came to beside.     Gloriajean Dell  Remember to initiate Hypoglycemia Order Set & complete

## 2012-07-24 NOTE — Progress Notes (Signed)
RN made aware of patient with new slurred speech, facial droop and slumped over. Code stroke activated. CBG 22. Dextrose given with improvement to 126. Assessed patient. Mild left facial droop and slurred speech. Difficult with LLE heal-shin test. No other neuro deficits. Noncontrast CT ordered revealing no intracranial abnormality. On speaking with neuro, felt to be low-risk for acute CVA initially, and if noncon CT normal, not to pursue further work-up. Symptoms may be baseline (has cerebrovascular dz) or represent hypoglycemic episode. Continue to monitor CBGs.    Jacqulyn Bath, PA-C 07/24/2012 4:52 PM

## 2012-07-24 NOTE — Code Documentation (Signed)
78 yo male who was seen at his baseline at 1430, when he was asking for something to eat.  When they walked by the room at 1455 the staff found him slumped and dazed. They assisted him back to bed, checked his CBG which was 22.  Pt was given 3 OJs,, tube gel (15 Gms of glucose), and once IV established he was given 1/2 amp D50.  He continued with slurred speech and L facial droop.  Code stroke was activated at 1523. Team arrived at 1527. NIHSS=2, one for missing the month, and one for ataxia LLE per Dr. Thad Ranger.  He is alert, remembers recent events, and denies loss of consciousness.  Repeat CBG is 107 at 1555.  Code stroke canceled, but will get noncontrast CT as precaution per Dr. Thad Ranger.

## 2012-07-24 NOTE — H&P (Signed)
[name]   07/24/2012 12:30 PM Office Visit  MRN: [id number]   Description: 78 year old male  Provider: Olga Millers, MD  Department: [facility] [address]        Referring Provider     Jacques Navy, MD      Diagnoses     Chest pain   - Primary    786.50    Dyspnea     786.09    Unspecified essential hypertension     401.9    Other and unspecified hyperlipidemia     272.4    Coronary atherosclerosis of unspecified type of vessel, native or graft     414.00    Other specified forms of chronic ischemic heart disease     414.8    Personal history of unspecified circulatory disease     V12.50      Reason for Visit     Chest Pain    Add on         Progress Notes     Olga Millers, MD  07/24/2012 12:21 PM  Signed    HPI: [name] is a gentleman who has a history of coronary artery disease, status post coronary artery bypass graft in 2006. His most recent Myoview was performed in August of 2011. 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 He also has cerebrovascular disease. Last carotid Dopplers were performed in May of 2013 and showed a 40-59% right and 60-70% left stenosis. Followup was recommended in six months. Last echocardiogram in April of 2013 showed an ejection fraction of 60-65% and mild mitral regurgitation. Cardiac catheterization in March of 2013 showed an occluded LAD. The mid and distal LAD filled from the vein graft. The apical LAD had a 99% stenosis which was unchanged. The distal circumflex was occluded prior to the second marginal. The second marginal filled via the vein graft. The right coronary artery had a proximal 25%, mid 40% and distal 25% lesion. Saphenous vein graft to the diagonal was occluded. Medical therapy recommended. The patient presented to the office today with complaints of chest pain and dyspnea for one week. His pain is in the left chest area and  described as a sharp, balling up sensation. He does not radiate. No associated symptoms. It improves with eating and also certain movements. He had a more severe episode this morning lasting approximately 20 minutes. He also complains of increased dyspnea on exertion and orthopnea for approximately one week. He has chronic mild edema in the right lower extremity but no increase in the left. He denies fevers, chills or productive cough.      Current Outpatient Prescriptions   Medication  Sig  Dispense  Refill   .  aspirin EC 81 MG EC tablet  Take 1 tablet (81 mg total) by mouth daily.         .  furosemide (LASIX) 40 MG tablet  take 1 tablet by mouth once daily   30 tablet   11   .  glimepiride (AMARYL) 2 MG tablet  Take 1 tablet (2 mg total) by mouth daily before breakfast. Diabetes.   30 tablet   11   .  hydrALAZINE (APRESOLINE) 25 MG tablet  Take 1 tablet (25 mg total) by mouth every 8 (eight) hours.   90 tablet   6   .  Insulin Pen Needle (NOVOFINE) 30G X 8 MM MISC  Use as directed once daily   100 each   3   .  isosorbide mononitrate (IMDUR) 30 MG 24 hr tablet  Take 1 tablet (30 mg total) by mouth daily.   30 tablet   11   .  LEVEMIR FLEXPEN 100 UNIT/ML injection  inject 30 units at bedtime   15 Syringe   5   .  Lite Touch Lancets MISC  Inject 1 Device into the skin daily.   30 each   11   .  losartan (COZAAR) 50 MG tablet  take 1 tablet by mouth once daily   30 tablet   5   .  metoprolol succinate (TOPROL-XL) 25 MG 24 hr tablet  take 1 tablet by mouth once daily   30 tablet   3   .  nitroGLYCERIN (NITROSTAT) 0.4 MG SL tablet  Place 1 tablet (0.4 mg total) under the tongue every 5 (five) minutes x 3 doses as needed for chest pain.   25 tablet   4   .  ONE TOUCH ULTRA TEST test strip  1 each by Other route as needed.          .  simvastatin (ZOCOR) 40 MG tablet  Take 1 tablet (40 mg total) by mouth daily.   30 tablet   3       Current Facility-Administered Medications   Medication  Dose   Route  Frequency  Provider  Last Rate  Last Dose   .  DISCONTD: dextrose 5 % solution     Intravenous  Continuous  Hilarie Fredrickson, MD               Past Medical History   Diagnosis  Date   .  CAD (coronary artery disease)         S/P CABG in 2006. stable anatomy by cath 12/21/11 with 2/3 patent bypass grafts - for med rx. Not on BB 2/2 second-degree AVB.   .  LV dysfunction         Per Myoview in 2008; Ef 46% with prior inferior and apical infarct, no ischemia   .  Cerebrovascular disease     .  HLD (hyperlipidemia)     .  Diastolic dysfunction     .  Mediastinal adenopathy         per CT chest in 2006 with PET scan showing very limited metabolic activity; not felt to have a significant neoplastic potential   .  Anemia     .  Hypertension     .  Diabetes mellitus         insulin dependent    .  GERD (gastroesophageal reflux disease)     .  AV block, Mobitz 1         Noted 11/2011 in hospital, BB stopped   .  Colon polyps     .  Diverticulosis         Past Surgical History   Procedure  Date   .  Coronary artery bypass graft  2006       SVG to OM2, SVG to LAD, SVG to DX; (the LIMA did not have good flow and therefore was not used)   .  Appendectomy         History       Social History   .  Marital Status:  Widowed       Spouse Name:  N/A       Number  of Children:  12   .  Years of Education:  N/A       Occupational History   .  Retired         Social History Main Topics   .  Smoking status:  Former Smoker       Quit date:  09/27/1953   .  Smokeless tobacco:  Never Used   .  Alcohol Use:  No   .  Drug Use:  No   .  Sexually Active:  No       Other Topics  Concern   .  Not on file       Social History Narrative     10th grade. Married '64-10 yrs/widowed; married '77 - 61yrs/widowed. 7 sons, 5 daughters; 30+ grandchildren, 15 great-grands. 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 His son checks on him all the  time.       ROS: no fevers or chills, productive cough, hemoptysis, dysphasia, odynophagia, melena, hematochezia, dysuria, hematuria, rash, seizure activity, orthopnea, PND, pedal edema, claudication. Remaining systems are negative.   Physical Exam: Well-developed well-nourished in no acute distress.   Skin is warm and dry.   HEENT is normal. Normal eyelids Neck is supple. Normal carotid upstroke with no bruits. No jugular venous distention. Chest is clear to auscultation with normal expansion. Previous sternotomy Cardiovascular exam is regular rate and rhythm. 2/6 systolic murmur apex Abdominal exam nontender or distended. No masses palpated. No hepatosplenomegaly Extremities show trace edema on the right. neuro grossly intact   ECG sinus rhythm at a rate of 73. First degree AV block. Lateral T-wave inversion. No significant change compared to April 2013.              Dyspnea - Olga Millers, MD  07/24/2012 12:20 PM  Addendum Plan admit to telemetry bed. Patient not significantly volume overloaded on exam but symptoms suggestive of diastolic congestive heart failure. Increase Lasix to 40 mg twice a day tonight and in AM and reassess. Check BNP. Check chest x-ray. Follow renal function. No symptoms suggestive of infection.   The patient will be admitted under 24-hour observation status. Hopefully he can be discharged tomorrow morning after diuresis assuming his symptoms improved and his enzymes are negative.  Previous Version  Chest pain - Olga Millers, MD  07/24/2012 12:16 PM  Signed Symptoms atypical and electrocardiogram unchanged. Cycle enzymes. If negative no plans for further ischemia evaluation given results of recent catheterization.  HYPERTENSION - Olga Millers, MD  07/24/2012 12:17 PM  Signed Blood pressure is elevated. Increase Cozaar to 100 mg daily. Follow blood pressure and increase medications as needed.  HYPERLIPIDEMIA - Olga Millers, MD  07/24/2012  12:17 PM  Signed Continue statin.  CORONARY ARTERY DISEASE - Olga Millers, MD  07/24/2012 12:17 PM  Signed Continue aspirin and statin.  CARDIOMYOPATHY, ISCHEMIC - Olga Millers, MD  07/24/2012 12:18 PM  Signed Continue ARB and beta blocker.  CEREBROVASCULAR DISEASE, HX OF - Olga Millers, MD  07/24/2012 12:18 PM  Signed Continue aspirin and statin. Followup carotid Dopplers November 2013.

## 2012-07-25 ENCOUNTER — Inpatient Hospital Stay (HOSPITAL_COMMUNITY): Payer: Medicare Other

## 2012-07-25 DIAGNOSIS — E1149 Type 2 diabetes mellitus with other diabetic neurological complication: Secondary | ICD-10-CM

## 2012-07-25 DIAGNOSIS — I059 Rheumatic mitral valve disease, unspecified: Secondary | ICD-10-CM

## 2012-07-25 LAB — COMPREHENSIVE METABOLIC PANEL
ALT: 49 U/L (ref 0–53)
Alkaline Phosphatase: 96 U/L (ref 39–117)
CO2: 26 mEq/L (ref 19–32)
GFR calc Af Amer: 33 mL/min — ABNORMAL LOW (ref >90)
Glucose, Bld: 61 mg/dL — ABNORMAL LOW (ref 70–99)
Potassium: 3.9 mEq/L (ref 3.5–5.1)
Sodium: 139 mEq/L (ref 135–145)
Total Protein: 6.8 g/dL (ref 6.0–8.3)

## 2012-07-25 LAB — GLUCOSE, CAPILLARY: Glucose-Capillary: 104 mg/dL — ABNORMAL HIGH (ref 70–99)

## 2012-07-25 LAB — PRO B NATRIURETIC PEPTIDE: Pro B Natriuretic peptide (BNP): 4471 pg/mL — ABNORMAL HIGH (ref 0–450)

## 2012-07-25 MED ORDER — DEXTROSE 50 % IV SOLN
50.0000 mL | Freq: Once | INTRAVENOUS | Status: AC | PRN
  Administered 2012-07-25: 25 mL via INTRAVENOUS

## 2012-07-25 MED ORDER — ENOXAPARIN SODIUM 40 MG/0.4ML ~~LOC~~ SOLN
40.0000 mg | SUBCUTANEOUS | Status: DC
  Administered 2012-07-25 – 2012-07-26 (×2): 40 mg via SUBCUTANEOUS
  Filled 2012-07-25 (×3): qty 0.4

## 2012-07-25 MED ORDER — DEXTROSE 50 % IV SOLN
INTRAVENOUS | Status: AC
  Filled 2012-07-25: qty 50

## 2012-07-25 NOTE — Progress Notes (Signed)
Inpatient Diabetes Program Recommendations  AACE/ADA: New Consensus Statement on Inpatient Glycemic Control (2013)  Target Ranges:  Prepandial:   less than 140 mg/dL      Peak postprandial:   less than 180 mg/dL (1-2 hours)      Critically ill patients:  140 - 180 mg/dL   Results for JONMARC, BODKIN (MRN [id number]) as of 07/25/2012 14:09  Ref. Range 07/25/2012 06:33 07/25/2012 06:54 07/25/2012 11:18  Glucose-Capillary Latest Range: 70-99 mg/dL 24 (LL) 409 (H) 811 (H)    Inpatient Diabetes Program Recommendations Insulin - Basal: Decrease Levemir 15 units (1/2 home dose)   Thank you  Piedad Climes RN,BSN,CDE Inpatient Diabetes Coordinator

## 2012-07-25 NOTE — Progress Notes (Signed)
    Subjective:  Chest pain is resolved but the patient is complaining of shortness of breath today. States his breathing is much worse than normal. He denies cough or fever. Mental status change noted and this was associated with hypoglycemia. He apparently is back to his baseline now.  Objective:  Vital Signs in the last 24 hours: Temp:  [97.5 F (36.4 C)-98.3 F (36.8 C)] 98 F (36.7 C) (10/29 0523) Pulse Rate:  [73-85] 85  (10/29 0523) Resp:  [18-20] 20  (10/29 0523) BP: (154-205)/(70-99) 154/70 mmHg (10/29 0815) SpO2:  [93 %-100 %] 100 % (10/29 0523) FiO2 (%):  [100 %] 100 % (10/28 1519) Weight:  [94.212 kg (207 lb 11.2 oz)] 94.212 kg (207 lb 11.2 oz) (10/29 0523)  Intake/Output from previous day: 10/28 0701 - 10/29 0700 In: 240 [P.O.:240] Out: 1275 [Urine:1275]  Physical Exam: Pt is alert and oriented, pleasant elderly male in NAD HEENT: normal Neck: JVP - normal, carotids 2+= without bruits Lungs: CTA bilaterally CV: RRR with grade 2/6 early systolic murmur at the left sternal border Abd: soft, NT, Positive BS, no hepatomegaly Ext: no C/C/E, distal pulses intact and equal Skin: warm/dry no rash   Lab Results:  Basename 07/24/12 1540  WBC 4.1  HGB 10.4*  PLT 132*    Basename 07/25/12 0307 07/24/12 1540  NA 139 138  K 3.9 4.0  CL 104 104  CO2 26 23  GLUCOSE 61* 102*  BUN 29* 27*  CREATININE 2.09* 2.03*    Basename 07/25/12 0307 07/24/12 2120  TROPONINI <0.30 <0.30   Assessment/Plan:  1. Shortness of breath, uncertain etiology.  2. Coronary artery disease status post CABG. 2 bypasses were patent at his recent cardiac catheterization about 6 months ago.  3. Type 2 diabetes with hypoglycemic episode noted. Resolution of neurologic symptoms after correction of hypoglycemia. Appreciate neurology input.  I reviewed the patient's previous echocardiogram which estimated his pulmonary artery pressure greater than 70 mm mercury. I am going to repeat his  echocardiogram, check a BNP level, and check a d-dimer. Also will do a chest x-ray to evaluate his shortness of breath. Further plans pending the results of those tests. He has ruled out for myocardial infarction.   Tonny Bollman, M.D. 07/25/2012, 1:29 PM

## 2012-07-25 NOTE — Progress Notes (Signed)
Mr. Pallas is well [place] to me. Reviewed admission notes and subsequent notes. Change in mental status with hypoglycemia noted. He has made a good recovery and on evaluation this AM appears to be at his baseline mentally.  DM -  Lab Results  Component Value Date   HGBA1C 6.6* 07/24/2012   Plan will d/c oral sulonylurea.  Continue basal insulin   At discharge will start a DPP4- e.g. onglyza 5 mg

## 2012-07-25 NOTE — Progress Notes (Signed)
ANTICOAGULATION CONSULT NOTE - Initial Consult  Pharmacy Consult for lovenox Indication: VTE prophylaxis  No Known Allergies  Patient Measurements: Height: 5\' 11"  (180.3 cm) Weight: 207 lb 11.2 oz (94.212 kg) IBW/kg (Calculated) : 75.3    Vital Signs: Temp: 98 F (36.7 C) (10/29 0523) Temp src: Oral (10/29 0523) BP: 154/70 mmHg (10/29 0815) Pulse Rate: 85  (10/29 0523)  Labs:  Basename 07/25/12 0307 07/24/12 2120 07/24/12 1540  HGB -- -- 10.4*  HCT -- -- 31.5*  PLT -- -- 132*  APTT -- -- --  LABPROT -- -- --  INR -- -- --  HEPARINUNFRC -- -- --  CREATININE 2.09* -- 2.03*  CKTOTAL -- -- --  CKMB -- -- --  TROPONINI <0.30 <0.30 <0.30    Estimated Creatinine Clearance: 33.6 ml/min (by C-G formula based on Cr of 2.09).   Medical History: Past Medical History  Diagnosis Date  . CAD (coronary artery disease)     S/P CABG in 2006. stable anatomy by cath 12/21/11 with 2/3 patent bypass grafts - for med rx. Not on BB 2/2 second-degree AVB.  . LV dysfunction     Per Myoview in 2008; Ef 46% with prior inferior and apical infarct, no ischemia  . Cerebrovascular disease   . HLD (hyperlipidemia)   . Diastolic dysfunction   . Mediastinal adenopathy     per CT chest in 2006 with PET scan showing very limited metabolic activity; not felt to have a significant neoplastic potential  . Anemia   . Hypertension   . Diabetes mellitus     insulin dependent   . GERD (gastroesophageal reflux disease)   . AV block, Mobitz 1     Noted 11/2011 in hospital, BB stopped  . Colon polyps   . Diverticulosis    Assessment: To start lovenox for VTE prophylaxis.  Patient has renal insufficiency with crcl~34.   Plan:  1) Lovenox 40mg  SQ q24h 2) Will monitor renal function closely and adjust dose as needed.  Genisis Sonnier P 07/25/2012,1:45 PM

## 2012-07-25 NOTE — Progress Notes (Signed)
Hypoglycemic Event  CBG: 24   Treatment: 15 GM carbohydrate snack and D50 IV 25 mL  Symptoms: Pale, Sweaty, Shaky and Nervous/irritable and confusion  Follow-up CBG: Time:0655 CBG Result:124  Possible Reasons for Event: medications?      Arva Chafe  Remember to initiate Hypoglycemia Order Set & complete

## 2012-07-25 NOTE — Progress Notes (Signed)
  Echocardiogram 2D Echocardiogram has been performed.  Janaisha Tolsma FRANCES 07/25/2012, 3:39 PM

## 2012-07-26 ENCOUNTER — Inpatient Hospital Stay (HOSPITAL_COMMUNITY): Payer: Medicare Other

## 2012-07-26 DIAGNOSIS — I5033 Acute on chronic diastolic (congestive) heart failure: Secondary | ICD-10-CM

## 2012-07-26 DIAGNOSIS — I509 Heart failure, unspecified: Secondary | ICD-10-CM

## 2012-07-26 LAB — GLUCOSE, CAPILLARY
Glucose-Capillary: 103 mg/dL — ABNORMAL HIGH (ref 70–99)
Glucose-Capillary: 128 mg/dL — ABNORMAL HIGH (ref 70–99)
Glucose-Capillary: 214 mg/dL — ABNORMAL HIGH (ref 70–99)

## 2012-07-26 MED ORDER — AMLODIPINE BESYLATE 5 MG PO TABS
5.0000 mg | ORAL_TABLET | Freq: Every day | ORAL | Status: DC
  Administered 2012-07-26 – 2012-07-27 (×2): 5 mg via ORAL
  Filled 2012-07-26 (×3): qty 1

## 2012-07-26 MED ORDER — INSULIN DETEMIR 100 UNIT/ML ~~LOC~~ SOLN
15.0000 [IU] | Freq: Every day | SUBCUTANEOUS | Status: DC
  Administered 2012-07-26 – 2012-08-19 (×25): 15 [IU] via SUBCUTANEOUS
  Filled 2012-07-26 (×3): qty 10

## 2012-07-26 MED ORDER — TECHNETIUM TO 99M ALBUMIN AGGREGATED
6.0000 | Freq: Once | INTRAVENOUS | Status: AC | PRN
  Administered 2012-07-26: 6 via INTRAVENOUS

## 2012-07-26 MED ORDER — TECHNETIUM TC 99M DIETHYLENETRIAME-PENTAACETIC ACID: 40.0000 | Freq: Once | INTRAVENOUS | Status: AC | PRN

## 2012-07-26 MED ORDER — HYDRALAZINE HCL 50 MG PO TABS
50.0000 mg | ORAL_TABLET | Freq: Three times a day (TID) | ORAL | Status: DC
  Administered 2012-07-26 – 2012-07-28 (×6): 50 mg via ORAL
  Filled 2012-07-26 (×9): qty 1

## 2012-07-26 MED ORDER — FUROSEMIDE 10 MG/ML IJ SOLN
40.0000 mg | Freq: Once | INTRAMUSCULAR | Status: AC
  Administered 2012-07-26: 40 mg via INTRAVENOUS
  Filled 2012-07-26: qty 4

## 2012-07-26 NOTE — Addendum Note (Signed)
Addended by: Reine Just on: 07/26/2012 05:37 PM   Modules accepted: Orders

## 2012-07-26 NOTE — Progress Notes (Signed)
Patient Name: [name] Date of Encounter: 07/26/2012     Principal Problem:  *Acute on chronic diastolic CHF (congestive heart failure) Active Problems:  DIABETES MELLITUS  HYPERLIPIDEMIA  HYPERTENSION  CORONARY ARTERY DISEASE  Hypoglycemia    SUBJECTIVE: Patient remains markedly short of breath on exertion. This is somewhat improved. Mild, transient sharp chest pain this morning lasting several seconds. Reports 1 week history of shortness of breath and chest pain. Denies fevers, chills, new cough. He does have chronic unilateral LE edema which he attributes to vein harvesting done in that leg for CABG. No LE redness or tenderness. Denies prior dx of COPD. Has not smoked tobacco since 1955.    OBJECTIVE  Filed Vitals:   07/25/12 1357 07/25/12 1700 07/25/12 2015 07/26/12 0603  BP: 183/86 155/82 156/73 180/77  Pulse: 74  78 78  Temp: 98.6 F (37 C)  98 F (36.7 C) 98.5 F (36.9 C)  TempSrc:   Oral Oral  Resp: 18  20 24   Height:      Weight:    94.031 kg (207 lb 4.8 oz)  SpO2: 97%  97% 99%    Weight change: -4.853 kg (-10 lb 11.2 oz)  PHYSICAL EXAM  General: Elderly, well developed, well nourished, in no acute distress. Head: Normocephalic, atraumatic, sclera non-icteric, no xanthomas, nares are without discharge.  Neck: Supple without bruits or JVD. Lungs:  Resp regular and unlabored, diffuse faint exp wheezing, trace bibasilar rales. No rhonchi.  Heart: RRR, II/VI systolic murmur at RUSB, no s3, s4, or murmurs. Abdomen: Soft, non-tender, non-distended, BS + x 4.  Msk:  Strength and tone appears normal for age. Extremities: No clubbing, cyanosis or edema. DP/PT/Radials 2+ and equal bilaterally. Neuro: Alert and oriented X 3. Moves all extremities spontaneously. Psych: Normal affect.  LABS:  Recent Labs  [facility] 07/24/12 1540   WBC 4.1   HGB 10.4*   HCT 31.5*   MCV 87.0   PLT 132*   Recent Labs  [facility] 07/25/12 1721   DDIMER 1.49*    Lab  07/25/12 0307 07/24/12 1540  NA 139 138  K 3.9 4.0  CL 104 104  CO2 26 23  BUN 29* 27*  CREATININE 2.09* 2.03*  CALCIUM 9.1 9.6  PROT 6.8 --  BILITOT 0.2* --  ALKPHOS 96 --  ALT 49 --  AST 42* --  AMYLASE -- --  LIPASE -- --  GLUCOSE 61* 102*   Recent Labs  Basename 07/24/12 1541   HGBA1C 6.6*   Recent Labs  Basename 07/25/12 0307 07/24/12 2120 07/24/12 1540   CKTOTAL -- -- --   CKMB -- -- --   CKMBINDEX -- -- --   TROPONINI <0.30 <0.30 <0.30    TELE: NSR, 70-90 bpm, trace PVCs  Radiology/Studies:  Dg Chest 2 View  07/25/2012  *RADIOLOGY REPORT*  Clinical Data: Chest pain and short of breath  CHEST - 2 VIEW  Comparison: 12/22/2011  Findings: Prior CABG.  Cardiac enlargement with pulmonary vascular congestion and mild edema.  Small pleural effusions bilaterally with mild bibasilar atelectasis.  IMPRESSION: Congestive heart failure with mild edema and small pleural effusions.   Original Report Authenticated By: Camelia Phenes, M.D.    Ct Head Wo Contrast  07/24/2012  *RADIOLOGY REPORT*  Clinical Data: Code stroke.  Slurred speech, left facial droop.  CT HEAD WITHOUT CONTRAST  Technique:  Contiguous axial images were obtained from the base of the skull through the vertex without contrast.  Comparison: 01/23/2009  Findings: There is atrophy and chronic small vessel disease changes. No acute intracranial abnormality.  Specifically, no hemorrhage, hydrocephalus, mass lesion, acute infarction, or significant intracranial injury.  No acute calvarial abnormality. Visualized paranasal sinuses and mastoids clear.  Orbital soft tissues unremarkable.  IMPRESSION: No acute intracranial abnormality.  Atrophy, chronic microvascular disease.   Original Report Authenticated By: Cyndie Chime, M.D.     Current Medications:     . aspirin EC  81 mg Oral Daily  . dextrose      . enoxaparin (LOVENOX) injection  40 mg Subcutaneous Q24H  . furosemide  40 mg Oral BID  . hydrALAZINE  25 mg Oral  Q8H  . insulin aspart  0-15 Units Subcutaneous TID WC  . insulin detemir  30 Units Subcutaneous Daily  . isosorbide mononitrate  30 mg Oral Daily  . losartan  50 mg Oral Daily  . metoprolol succinate  25 mg Oral Daily  . simvastatin  40 mg Oral q1800    ASSESSMENT AND PLAN:  1. Acute on chronic diastolic CHF- remains short of breath on mild exertion. Exam reveals exp wheezing and trace bibasilar rales. Close to diuresis as no JVD is appreciated and he is near baseline weight. pBNP elevated at 4471.0. CXR revealed CHF changes with mild pulmonary edema and bilateral pleural effusions. Echo yest reveals LVEF 50-55%, grade 2 dd, mild LVH, posterior wall AK, mild MR, mild biatrial enlargment, mild RV dilatation, moderate TR and PASP 76 mmHg. D-dimer mildly elevated, even after age-adjustment. Question underlying chronic pulmonary etiology attributing to elevated PASP (noted on prior echos dating back to 2006) or result of longstanding LV diastolic dysfxn. He has diuresed ~ 2.5 L since admission, and weight, if accurate, has come down considerably. Given persistent dyspnea/tachypnea, plan for V/Q scan to r/o PE. Has underlying CKD. Continue Lasix and monitor output. Wheezing may be secondary to CHF as he has no history of underlying pulmonary dysfunction. If persists, add SABA.   2. CAD- s/p CABG x 3 in 2006. Admitted from the office for formal rule-out after c/o chest pain and dyspnea x 1 week. Trop-I WNL x 3. Has effectively ruled out. Continue ASA, BB, hydralazine/Imdur, statin. ARB with CKD? Has been on for some time with stable Cr.   3. HTN- pressure needs to be better controlled. Increase hydralazine to 50mg  TID.   4. Dyslipidemia- continue statin.  5. Type 2 DM- will decrease Levemir per diabetes coord recs and given morning hypoglycemia. Did have an episode of marked hypoglycemia with MS changes on admission also. Continue SSI. Per PCP, oral sulfonylurea will be stopped and DPP-4 started on  discharge.   6. CKD, stage 4- baseline Cr 2.0-2.1. Continue to monitor with diuresis.   Signed, R. Hurman Horn, PA-C 07/26/2012, 10:36 AM  ADD: V/Q negative for PE. Discussed with MD. Will consider further ischemic work-up given persistent dyspnea despite nearing euvolemia. Further recommendations/medication adjustments following MD review.   Jacqulyn Bath, PA-C 07/26/2012 4:35 PM

## 2012-07-26 NOTE — Progress Notes (Signed)
    I have seen and examined the patient. I agree with the above note with the addition of : V-Q scan was normal. I suspect most of his symptoms are due to diastolic heart failure and pulmonary hypertension. By exam, still has small bilateral pleural effusions. He reports feeling better this evening.  I will give one dose of IV Lasix 40 mg, check BMP in am. I will add Amlodipine for BP control.  Possible discharge tomorrow.  He did have cardiac cath done early this year without change in anatomy. Thus, no ischemic work up will be ordered.     Lorine Bears MD, [facility] 07/26/2012 5:27 PM

## 2012-07-26 NOTE — Progress Notes (Signed)
Utilization Review Completed.  

## 2012-07-27 LAB — BASIC METABOLIC PANEL
Chloride: 100 mEq/L (ref 96–112)
GFR calc Af Amer: 29 mL/min — ABNORMAL LOW (ref >90)
GFR calc non Af Amer: 25 mL/min — ABNORMAL LOW (ref >90)
Potassium: 3.8 mEq/L (ref 3.5–5.1)
Sodium: 137 mEq/L (ref 135–145)

## 2012-07-27 LAB — GLUCOSE, CAPILLARY
Glucose-Capillary: 131 mg/dL — ABNORMAL HIGH (ref 70–99)
Glucose-Capillary: 265 mg/dL — ABNORMAL HIGH (ref 70–99)

## 2012-07-27 MED ORDER — DIAZEPAM 5 MG PO TABS
5.0000 mg | ORAL_TABLET | ORAL | Status: AC
  Administered 2012-07-28: 5 mg via ORAL
  Filled 2012-07-27: qty 1

## 2012-07-27 MED ORDER — ENOXAPARIN SODIUM 30 MG/0.3ML ~~LOC~~ SOLN
30.0000 mg | SUBCUTANEOUS | Status: DC
  Administered 2012-07-27: 30 mg via SUBCUTANEOUS
  Filled 2012-07-27 (×2): qty 0.3

## 2012-07-27 MED ORDER — SODIUM CHLORIDE 0.9 % IJ SOLN: 3.0000 mL | INTRAMUSCULAR | Status: DC | PRN

## 2012-07-27 MED ORDER — ASPIRIN 81 MG PO CHEW
324.0000 mg | CHEWABLE_TABLET | ORAL | Status: AC
  Administered 2012-07-28: 324 mg via ORAL
  Filled 2012-07-27: qty 4

## 2012-07-27 MED ORDER — SODIUM CHLORIDE 0.9 % IV SOLN: 250.0000 mL | INTRAVENOUS | Status: DC | PRN

## 2012-07-27 MED ORDER — ATORVASTATIN CALCIUM 20 MG PO TABS
20.0000 mg | ORAL_TABLET | Freq: Every day | ORAL | Status: DC
  Administered 2012-07-27 – 2012-08-19 (×24): 20 mg via ORAL
  Filled 2012-07-27 (×25): qty 1

## 2012-07-27 MED ORDER — SODIUM CHLORIDE 0.9 % IJ SOLN
3.0000 mL | Freq: Two times a day (BID) | INTRAMUSCULAR | Status: DC
  Administered 2012-07-27: 3 mL via INTRAVENOUS

## 2012-07-27 NOTE — Care Management Note (Unsigned)
    Page 1 of 1   08/17/2012     3:07:30 PM   CARE MANAGEMENT NOTE 08/17/2012  Patient:  [name], [name]   Account Number:  [id number]  Date Initiated:  07/27/2012  Documentation initiated by:  Audriella Blakeley  Subjective/Objective Assessment:   PT ADM ON 07/24/12 WITH CP, CHF EXACERBATION.  PTA, PT INDEPENDENT, LIVES WITH SON AND DAUGHTER IN LAW.     Action/Plan:   WILL FOLLOW FOR HOME NEEDS AS PT PROGRESSES.  WOULD LIKELY BENEFIT FROM HHRN FOR CHF FOLLOW UP AT DISCHARGE.  MD LEAVE ORDER IF YOU AGREE.   Anticipated DC Date:  07/28/2012   Anticipated DC Plan:  HOME W HOME HEALTH SERVICES      DC Planning Services  CM consult      Choice offered to / List presented to:             Status of service:  In process, will continue to follow Medicare Important Message given?   (If response is "NO", the following Medicare IM given date fields will be blank) Date Medicare IM given:   Date Additional Medicare IM given:    Discharge Disposition:    Per UR Regulation:  Reviewed for med. necessity/level of care/duration of stay  If discussed at Long Length of Stay Meetings, dates discussed:    Comments:  08/17/12 Luie Laneve,RN,BSN [phone] PT CONT ON IV LASIX; RELUCTANT TO START HD--WANTS TO SPEAK WITH HIS FRIEND FIRST.  WILL FOLLOW.  08/10/12 Lekia Nier,RN,BSN [phone] CONT TO FOLLOW PT PROGRESS.  PTA, PT INDEPENDENT, LIVES WITH SON AND DAUGHTER IN LAW.  HE DENIES ANY NEEDS FOR HOME.

## 2012-07-27 NOTE — Progress Notes (Signed)
Patient Name: [name] Date of Encounter: 07/27/2012     Principal Problem:  *Acute on chronic diastolic CHF (congestive heart failure) Active Problems:  DIABETES MELLITUS  HYPERLIPIDEMIA  HYPERTENSION  CORONARY ARTERY DISEASE  Hypoglycemia    SUBJECTIVE: Feels well this AM. Still c/o dyspnea on mild exertion. Intermittent left-sided chest tightness described as a "tight ball in my chest." No PND, orthopnea or LEE.    OBJECTIVE  Filed Vitals:   07/26/12 0603 07/26/12 1700 07/26/12 1955 07/27/12 0508  BP: 180/77 142/71 147/81 143/68  Pulse: 78 84 84 74  Temp: 98.5 F (36.9 C) 97.7 F (36.5 C) 97.7 F (36.5 C) 98.8 F (37.1 C)  TempSrc: Oral Oral Oral Oral  Resp: 24 20 20 18   Height:      Weight: 94.031 kg (207 lb 4.8 oz)   92.534 kg (204 lb)  SpO2: 99% 95% 98% 97%    Intake/Output Summary (Last 24 hours) at 07/27/12 0758 Last data filed at 07/27/12 0519  Gross per 24 hour  Intake    360 ml  Output    400 ml  Net    -40 ml   Weight change: -1.497 kg (-3 lb 4.8 oz)  PHYSICAL EXAM  General: Well developed, well nourished, in no acute distress. Head: Normocephalic, atraumatic, sclera non-icteric, no xanthomas, nares are without discharge.  Neck: Supple without bruits or JVD. Lungs:  Resp regular and unlabored, fine bibasilar rales, mildly reduced bibasilar breath sounds, no wheezes or rhonchi Heart: RRR no s3, s4, II/VI systolic murmur at RUSB/LUSB/LLSB Abdomen: Soft, non-tender, distention of central adiposity, BS + x 4.  Msk:  Strength and tone appears normal for age. Extremities: No clubbing, cyanosis or edema. DP/PT/Radials 2+ and equal bilaterally. Neuro: Alert and oriented X 3. Moves all extremities spontaneously. Psych: Normal affect.  LABS:  Recent Labs  Basename 07/24/12 1540   WBC 4.1   HGB 10.4*   HCT 31.5*   MCV 87.0   PLT 132*   Recent Labs  [facility] 07/25/12 1721   DDIMER 1.49*    Lab 07/27/12 0521 07/25/12 0307 07/24/12  1540  NA 137 139 138  K 3.8 3.9 4.0  CL 100 104 104  CO2 28 26 23   BUN 33* 29* 27*  CREATININE 2.34* 2.09* 2.03*  CALCIUM 9.8 9.1 9.6  PROT -- 6.8 --  BILITOT -- 0.2* --  ALKPHOS -- 96 --  ALT -- 49 --  AST -- 42* --  AMYLASE -- -- --  LIPASE -- -- --  GLUCOSE 139* 61* 102*   Recent Labs  Basename 07/24/12 1541   HGBA1C 6.6*   Recent Labs  Basename 07/25/12 0307 07/24/12 2120 07/24/12 1540   CKTOTAL -- -- --   CKMB -- -- --   CKMBINDEX -- -- --   TROPONINI <0.30 <0.30 <0.30    TELE: NSR, 70-80 bpm, trace PVCs   Radiology/Studies:  Dg Chest 2 View  07/25/2012  *RADIOLOGY REPORT*  Clinical Data: Chest pain and short of breath  CHEST - 2 VIEW  Comparison: 12/22/2011  Findings: Prior CABG.  Cardiac enlargement with pulmonary vascular congestion and mild edema.  Small pleural effusions bilaterally with mild bibasilar atelectasis.  IMPRESSION: Congestive heart failure with mild edema and small pleural effusions.   Original Report Authenticated By: Camelia Phenes, M.D.    Ct Head Wo Contrast  07/24/2012  *RADIOLOGY REPORT*  Clinical Data: Code stroke.  Slurred speech, left facial droop.  CT HEAD WITHOUT CONTRAST  Technique:  Contiguous axial images were obtained from the base of the skull through the vertex without contrast.  Comparison: 01/23/2009  Findings: There is atrophy and chronic small vessel disease changes. No acute intracranial abnormality.  Specifically, no hemorrhage, hydrocephalus, mass lesion, acute infarction, or significant intracranial injury.  No acute calvarial abnormality. Visualized paranasal sinuses and mastoids clear.  Orbital soft tissues unremarkable.  IMPRESSION: No acute intracranial abnormality.  Atrophy, chronic microvascular disease.   Original Report Authenticated By: Cyndie Chime, M.D.    Nm Pulmonary Perf And Vent  07/26/2012  *RADIOLOGY REPORT*  Clinical Data: Chest pain and shortness of breath for the past week.  NM PULMONARY VENTILATION AND  PERFUSION SCAN  Radiopharmaceutical: CURIE MAA TECHNETIUM TO 24M ALBUMIN AGGREGATED and 40 mCi technetium 82m DTPA  Comparison: Chest radiographs obtained yesterday.  Findings: Normal ventilation of both lungs.  Normal perfusion of both lungs.  IMPRESSION: Normal examination.  No evidence of pulmonary embolism.   Original Report Authenticated By: Darrol Angel, M.D.     Current Medications:     . amLODipine  5 mg Oral Daily  . aspirin EC  81 mg Oral Daily  . enoxaparin (LOVENOX) injection  40 mg Subcutaneous Q24H  . furosemide  40 mg Intravenous Once  . furosemide  40 mg Oral BID  . hydrALAZINE  50 mg Oral Q8H  . insulin aspart  0-15 Units Subcutaneous TID WC  . insulin detemir  15 Units Subcutaneous Daily  . isosorbide mononitrate  30 mg Oral Daily  . losartan  50 mg Oral Daily  . metoprolol succinate  25 mg Oral Daily  . simvastatin  40 mg Oral q1800  . DISCONTD: hydrALAZINE  25 mg Oral Q8H  . DISCONTD: insulin detemir  30 Units Subcutaneous Daily    ASSESSMENT AND PLAN:  1. A/C diastolic CHF- still c/o dyspnea on mild exertion. On exam, fine bibasilar rales persist; however lungs have cleared up. O2 sat's high 90s on RA. I/O - 2.6 L. Weight 204 lbs which is at his baseline.  Cr bumped to 2.34. Hold off on AM Lasix dose. Hold ARB. He is euvolemic. V/Q scan negative for PE. No evidence to support pulmonary infxn. Question underlying pulmonary dysfunction given pulmonary HTN. May also represent long-standing L-sided diastolic dysfxn. Continue BB, hydralazine/imdur. Continue outpatient Lasix PO dosing tomorrow.   2. A/CKD, stage 4- Cr bumped with diuresis. Hold on AM Lasix dose.   3. CAD- formally ruled out. Still c/o intermittent L-sided chest tightness.  Cath 03/13 revealed 2V CAD s/p CABG x 3 w/ 2/3 patent grafts. Occluded SVG-Diag supplying small myocardial territory. No anatomic changes. Medically managed. Continue ASA/BB/statin/imdur.  4. HTN- much better controlled with  up-titrated hydralazine and addition of amlodipine.  5. Dyslipidemia- continue statin.   6. Type 2, DM- insulin adjusted. No morning hypoglycemia today.   Dispo: From a cardiac standpoint, patient has ruled out and is euvolemic. No evidence of PE or pulm infxn. ? Underlying pulmonary dysfxn or long-standing diastolic CHF attributing to DOE. He does have progressive pulmonary HTN on serial echo review. Hold Lasix today, resume tomorrow. Continue ARB in setting of CKD? Will need follow-up BMET if d/c today. +/- outpatient appointment with pulmonary for sleep study/PFTs. Will discuss further plans with MD.     Signed, R. Hurman Horn, PA-C 07/27/2012, 7:58 AM  Patient seen, examined. Available data reviewed. Agree with findings, assessment, and plan as outlined by Hurman Horn, PA-C. Pt with continued DOE with light  exertion (Class 3). Most significant findings are diastolic dysfunction and severe pulmonary HTN on echo. Unclear cause of PAH (diff dx includes diastolic CHF, OSA, chronic PE but VQ is ok, and connective tissue disease. Recommend right heart cath for hemodynamics - will schedule tomorrow, also will add ANA/RF to labs in the am. Can arrange outpatient sleep study to eval for OSA if needed. Risks, indication, alternatives to RHC reviewed with patient - will ask Dr Shirlee Latch to do.  Tonny Bollman, M.D. 07/27/2012 3:56 PM

## 2012-07-27 NOTE — Progress Notes (Signed)
ANTICOAGULATION CONSULT NOTE - Follow Up Consult  Pharmacy Consult for lovenox  Indication: VTE prophylaxis  No Known Allergies  Patient Measurements: Height: 5\' 11"  (180.3 cm) Weight: 204 lb (92.534 kg) IBW/kg (Calculated) : 75.3    Vital Signs: Temp: 98.8 F (37.1 C) (10/31 0508) Temp src: Oral (10/31 0508) BP: 143/68 mmHg (10/31 0508) Pulse Rate: 74  (10/31 0508)  Labs:  Basename 07/27/12 0521 07/25/12 0307 07/24/12 2120 07/24/12 1540  HGB -- -- -- 10.4*  HCT -- -- -- 31.5*  PLT -- -- -- 132*  APTT -- -- -- --  LABPROT -- -- -- --  INR -- -- -- --  HEPARINUNFRC -- -- -- --  CREATININE 2.34* 2.09* -- 2.03*  CKTOTAL -- -- -- --  CKMB -- -- -- --  TROPONINI -- <0.30 <0.30 <0.30    Estimated Creatinine Clearance: 29.8 ml/min (by C-G formula based on Cr of 2.34).  Assessment: Patient is a 78 y.o M with hx CKD stage 4 on lovenox for VTE prophylaxis.  SCr increased from 2.09 to 2.34 (est crcl~26-30).   Plan:  1) decrease lovenox to 30mg  q24h 2) monitor renal function and adjust dose as needed   Dannis Deroche P 07/27/2012,9:14 AM

## 2012-07-27 NOTE — Progress Notes (Signed)
Subjective: [name] is comfortable tonight. Chart reviewed including neg pulmonary perfusion study. He is for Cath.  No hypoglycemia noted  Objective: Lab: Lab Results  Component Value Date   WBC 4.1 07/24/2012   HGB 10.4* 07/24/2012   HCT 31.5* 07/24/2012   MCV 87.0 07/24/2012   PLT 132* 07/24/2012   BMET    Component Value Date/Time   NA 137 07/27/2012 0521   K 3.8 07/27/2012 0521   CL 100 07/27/2012 0521   CO2 28 07/27/2012 0521   GLUCOSE 139* 07/27/2012 0521   BUN 33* 07/27/2012 0521   CREATININE 2.34* 07/27/2012 0521   CREATININE 1.89* 12/20/2011 1233   CALCIUM 9.8 07/27/2012 0521   GFRNONAA 25* 07/27/2012 0521   GFRAA 29* 07/27/2012 0521     Imaging:  Scheduled Meds:   . amLODipine  5 mg Oral Daily  . aspirin  324 mg Oral Pre-Cath  . aspirin EC  81 mg Oral Daily  . atorvastatin  20 mg Oral q1800  . diazepam  5 mg Oral On Call  . enoxaparin (LOVENOX) injection  30 mg Subcutaneous Q24H  . furosemide  40 mg Oral BID  . hydrALAZINE  50 mg Oral Q8H  . insulin aspart  0-15 Units Subcutaneous TID WC  . insulin detemir  15 Units Subcutaneous Daily  . isosorbide mononitrate  30 mg Oral Daily  . losartan  50 mg Oral Daily  . metoprolol succinate  25 mg Oral Daily  . sodium chloride  3 mL Intravenous Q12H  . sodium chloride  3 mL Intravenous Q12H  . DISCONTD: enoxaparin (LOVENOX) injection  40 mg Subcutaneous Q24H  . DISCONTD: simvastatin  40 mg Oral q1800   Continuous Infusions:  PRN Meds:.sodium chloride, sodium chloride, acetaminophen, ALPRAZolam, dextrose, hydrALAZINE, nitroGLYCERIN, ondansetron (ZOFRAN) IV, sodium chloride, sodium chloride, technetium TC 41M diethylenetriame-pentaacetic acid   Physical Exam: Filed Vitals:   07/27/12 1337  BP: 159/72  Pulse: 74  Temp: 97.8 F (36.6 C)  Resp: 18   CBG (last 3)   Basename 07/27/12 1624 07/27/12 1140 07/27/12 0554  GLUCAP 132* 146* 131*          Assessment/Plan: DM - doing well on current  regimen. No changes in medication.    [name] IM (o) [phone]; (c) [phone] Call-grp - [name] IM Tele: [phone]  07/27/2012, 6:59 PM

## 2012-07-28 ENCOUNTER — Encounter (HOSPITAL_COMMUNITY)
Admission: AD | Disposition: A | Discharge status: Home / Self Care | Payer: Self-pay | Source: Ambulatory Visit | Attending: Cardiology

## 2012-07-28 DIAGNOSIS — I279 Pulmonary heart disease, unspecified: Secondary | ICD-10-CM

## 2012-07-28 DIAGNOSIS — I517 Cardiomegaly: Secondary | ICD-10-CM

## 2012-07-28 HISTORY — PX: RIGHT HEART CATHETERIZATION: SHX5447

## 2012-07-28 LAB — BASIC METABOLIC PANEL
BUN: 39 mg/dL — ABNORMAL HIGH (ref 6–23)
Chloride: 98 mEq/L (ref 96–112)
Creatinine, Ser: 2.94 mg/dL — ABNORMAL HIGH (ref 0.50–1.35)
GFR calc Af Amer: 22 mL/min — ABNORMAL LOW (ref >90)
GFR calc non Af Amer: 19 mL/min — ABNORMAL LOW (ref >90)

## 2012-07-28 LAB — POCT I-STAT 3, ART BLOOD GAS (G3+)
Acid-Base Excess: 4 mmol/L — ABNORMAL HIGH (ref 0.0–2.0)
Bicarbonate: 29.1 mEq/L — ABNORMAL HIGH (ref 20.0–24.0)
pH, Arterial: 7.4 (ref 7.350–7.450)

## 2012-07-28 LAB — CBC
HCT: 32.2 % — ABNORMAL LOW (ref 39.0–52.0)
MCH: 28.1 pg (ref 26.0–34.0)
MCV: 87 fL (ref 78.0–100.0)
Platelets: 141 10*3/uL — ABNORMAL LOW (ref 150–400)
RBC: 3.7 MIL/uL — ABNORMAL LOW (ref 4.22–5.81)
WBC: 4.5 10*3/uL (ref 4.0–10.5)

## 2012-07-28 LAB — CREATININE, SERUM: GFR calc Af Amer: 24 mL/min — ABNORMAL LOW (ref >90)

## 2012-07-28 LAB — PROTIME-INR: Prothrombin Time: 14.4 seconds (ref 11.6–15.2)

## 2012-07-28 SURGERY — RIGHT HEART CATH
Anesthesia: LOCAL | Laterality: Bilateral

## 2012-07-28 SURGERY — RIGHT HEART CATH
Anesthesia: LOCAL

## 2012-07-28 MED ORDER — NITROGLYCERIN 0.2 MG/ML ON CALL CATH LAB
INTRAVENOUS | Status: AC
  Filled 2012-07-28: qty 1

## 2012-07-28 MED ORDER — SODIUM CHLORIDE 0.9 % IV SOLN: 250.0000 mL | INTRAVENOUS | Status: DC

## 2012-07-28 MED ORDER — SILDENAFIL CITRATE 20 MG PO TABS
20.0000 mg | ORAL_TABLET | Freq: Three times a day (TID) | ORAL | Status: DC
  Administered 2012-07-28 – 2012-08-11 (×43): 20 mg via ORAL
  Filled 2012-07-28 (×46): qty 1

## 2012-07-28 MED ORDER — AMLODIPINE BESYLATE 10 MG PO TABS
10.0000 mg | ORAL_TABLET | Freq: Every day | ORAL | Status: DC
  Administered 2012-07-28 – 2012-08-17 (×21): 10 mg via ORAL
  Filled 2012-07-28 (×22): qty 1

## 2012-07-28 MED ORDER — SODIUM CHLORIDE 0.9 % IJ SOLN: 3.0000 mL | INTRAMUSCULAR | Status: DC | PRN

## 2012-07-28 MED ORDER — FENTANYL CITRATE 0.05 MG/ML IJ SOLN
INTRAMUSCULAR | Status: AC
  Filled 2012-07-28: qty 2

## 2012-07-28 MED ORDER — ONDANSETRON HCL 4 MG/2ML IJ SOLN
4.0000 mg | Freq: Four times a day (QID) | INTRAMUSCULAR | Status: DC | PRN
  Administered 2012-08-12 (×2): 4 mg via INTRAVENOUS
  Filled 2012-07-28 (×3): qty 2

## 2012-07-28 MED ORDER — SODIUM CHLORIDE 0.9 % IJ SOLN
3.0000 mL | Freq: Two times a day (BID) | INTRAMUSCULAR | Status: DC
  Administered 2012-07-29 – 2012-08-18 (×32): 3 mL via INTRAVENOUS
  Administered 2012-08-18: 23:00:00 via INTRAVENOUS
  Administered 2012-08-19: 3 mL via INTRAVENOUS

## 2012-07-28 MED ORDER — ACETAMINOPHEN 325 MG PO TABS
650.0000 mg | ORAL_TABLET | ORAL | Status: DC | PRN
  Administered 2012-08-02 – 2012-08-04 (×4): 650 mg via ORAL
  Filled 2012-07-28 (×4): qty 2

## 2012-07-28 MED ORDER — LIDOCAINE HCL (PF) 1 % IJ SOLN
INTRAMUSCULAR | Status: AC
  Filled 2012-07-28: qty 30

## 2012-07-28 MED ORDER — HEPARIN (PORCINE) IN NACL 2-0.9 UNIT/ML-% IJ SOLN
INTRAMUSCULAR | Status: AC
  Filled 2012-07-28: qty 1000

## 2012-07-28 MED ORDER — HYDRALAZINE HCL 50 MG PO TABS: 75.0000 mg | ORAL_TABLET | Freq: Three times a day (TID) | ORAL | Status: DC

## 2012-07-28 MED ORDER — MIDAZOLAM HCL 2 MG/2ML IJ SOLN
INTRAMUSCULAR | Status: AC
  Filled 2012-07-28: qty 2

## 2012-07-28 MED ORDER — HEPARIN SODIUM (PORCINE) 5000 UNIT/ML IJ SOLN
5000.0000 [IU] | Freq: Three times a day (TID) | INTRAMUSCULAR | Status: DC
  Administered 2012-07-28 – 2012-08-19 (×48): 5000 [IU] via SUBCUTANEOUS
  Filled 2012-07-28 (×72): qty 1

## 2012-07-28 MED ORDER — HYDRALAZINE HCL 50 MG PO TABS
50.0000 mg | ORAL_TABLET | Freq: Three times a day (TID) | ORAL | Status: DC
  Administered 2012-07-28 – 2012-08-01 (×11): 50 mg via ORAL
  Filled 2012-07-28 (×15): qty 1

## 2012-07-28 NOTE — CV Procedure (Addendum)
   Cardiac Catheterization Procedure Note  Name: [name] MRN: [id number] DOB: [birth year]  Procedure: Right Heart Cath, Left Heart Cath for pressures only  Indication:    Procedural Details: The right groin was prepped, draped, and anesthetized with 1% lidocaine. Using the modified Seldinger technique a 5 French sheath was placed in the right femoral artery and a 7 French sheath was placed in the right femoral vein. A Swan-Ganz catheter was used for the right heart catheterization. Standard protocol was followed for recording of right heart pressures and sampling of oxygen saturations. Fick cardiac output was calculated. Pigtail catheter was used for LV pressure measurement.  Simultaneous LV/RV pressures were measured. There were no immediate procedural complications. The patient was transferred to the post catheterization recovery area for further monitoring.  Procedural Findings: Hemodynamics (mmHg) RA mean 12 RV 54/14 PA 60/23, mean 38 PCWP mean 16 LV 122/17 AO 124/53  Simultaneous LV/RV pressure tracings were recorded.  The RV tracing did appear to track with the LV (not discordant as one would expect with constrictive pericarditis).  The LV and RV pressure tracings did not have a dip and plateau pattern.   Oxygen saturations: PA 64% AO 87%  Cardiac Output (Fick) 8.75 (? Accuracy given variation in arterial saturation during case) Cardiac Index (Fick) 4.09  Transpulmonary gradient 22 mmHg  Final Conclusions:   Pulmonary arterial hypertension with elevated transpulmonary gradient (out of proportion to left-sided filling pressures).  Hemodynamics did not appear consistent with pericardial constriction.  Etiology of PAH uncertain.  V/Q scan did not suggest chronic PE.  Would check ANA, LFTs, HIV, outpatient sleep study.  Will get bubble study to rule out shunt (high cardiac output => though as above think it may be inaccurate).   Recommendations:  Reasonable for trial of  sildenafil 20 mg tid.  Will stop Imdur.  Creatinine up to 2.9 today.  PCWP is not elevated.  As creatinine has risen up from 2.3 yesterday, I will hold Lasix today.  He will need to restart in the future but would let creatinine stabilize out.  I will also hold losartan with rise in creatinine.  As BP is high, will increase amlodipine to 10.  As above, limited echo with bubble study to rule out shunt lesion.    [name] 07/28/2012, 9:25 AM

## 2012-07-28 NOTE — Interval H&P Note (Signed)
History and Physical Interval Note:  07/28/2012 9:22 AM  [name]  has presented today for surgery, with the diagnosis of chf  The various methods of treatment have been discussed with the patient and family. After consideration of risks, benefits and other options for treatment, the patient has consented to  Procedure(s) (LRB) with comments: RIGHT HEART CATH (Bilateral) as a surgical intervention .  The patient's history has been reviewed, patient examined, no change in status, stable for surgery.  I have reviewed the patient's chart and labs.  Questions were answered to the patient's satisfaction.     Chelsey Redondo [employer]

## 2012-07-28 NOTE — Progress Notes (Signed)
  Echocardiogram 2D Echocardiogram (limited with bubbles) has been performed.  Jorje Guild 07/28/2012, 3:33 PM

## 2012-07-28 NOTE — H&P (View-Only) (Signed)
 Patient Name: [name] Date of Encounter: 07/27/2012     Principal Problem:  *Acute on chronic diastolic CHF (congestive heart failure) Active Problems:  DIABETES MELLITUS  HYPERLIPIDEMIA  HYPERTENSION  CORONARY ARTERY DISEASE  Hypoglycemia    SUBJECTIVE: Feels well this AM. Still c/o dyspnea on mild exertion. Intermittent left-sided chest tightness described as a "tight ball in my chest." No PND, orthopnea or LEE.    OBJECTIVE  Filed Vitals:   07/26/12 0603 07/26/12 1700 07/26/12 1955 07/27/12 0508  BP: 180/77 142/71 147/81 143/68  Pulse: 78 84 84 74  Temp: 98.5 F (36.9 C) 97.7 F (36.5 C) 97.7 F (36.5 C) 98.8 F (37.1 C)  TempSrc: Oral Oral Oral Oral  Resp: 24 20 20 18  Height:      Weight: 94.031 kg (207 lb 4.8 oz)   92.534 kg (204 lb)  SpO2: 99% 95% 98% 97%    Intake/Output Summary (Last 24 hours) at 07/27/12 0758 Last data filed at 07/27/12 0519  Gross per 24 hour  Intake    360 ml  Output    400 ml  Net    -40 ml   Weight change: -1.497 kg (-3 lb 4.8 oz)  PHYSICAL EXAM  General: Well developed, well nourished, in no acute distress. Head: Normocephalic, atraumatic, sclera non-icteric, no xanthomas, nares are without discharge.  Neck: Supple without bruits or JVD. Lungs:  Resp regular and unlabored, fine bibasilar rales, mildly reduced bibasilar breath sounds, no wheezes or rhonchi Heart: RRR no s3, s4, II/VI systolic murmur at RUSB/LUSB/LLSB Abdomen: Soft, non-tender, distention of central adiposity, BS + x 4.  Msk:  Strength and tone appears normal for age. Extremities: No clubbing, cyanosis or edema. DP/PT/Radials 2+ and equal bilaterally. Neuro: Alert and oriented X 3. Moves all extremities spontaneously. Psych: Normal affect.  LABS:  Recent Labs  Basename 07/24/12 1540   WBC 4.1   HGB 10.4*   HCT 31.5*   MCV 87.0   PLT 132*   Recent Labs  Basename 07/25/12 1721   DDIMER 1.49*    Lab 07/27/12 0521 07/25/12 0307 07/24/12  1540  NA 137 139 138  K 3.8 3.9 4.0  CL 100 104 104  CO2 28 26 23  BUN 33* 29* 27*  CREATININE 2.34* 2.09* 2.03*  CALCIUM 9.8 9.1 9.6  PROT -- 6.8 --  BILITOT -- 0.2* --  ALKPHOS -- 96 --  ALT -- 49 --  AST -- 42* --  AMYLASE -- -- --  LIPASE -- -- --  GLUCOSE 139* 61* 102*   Recent Labs  Basename 07/24/12 1541   HGBA1C 6.6*   Recent Labs  Basename 07/25/12 0307 07/24/12 2120 07/24/12 1540   CKTOTAL -- -- --   CKMB -- -- --   CKMBINDEX -- -- --   TROPONINI <0.30 <0.30 <0.30    TELE: NSR, 70-80 bpm, trace PVCs   Radiology/Studies:  Dg Chest 2 View  07/25/2012  *RADIOLOGY REPORT*  Clinical Data: Chest pain and short of breath  CHEST - 2 VIEW  Comparison: 12/22/2011  Findings: Prior CABG.  Cardiac enlargement with pulmonary vascular congestion and mild edema.  Small pleural effusions bilaterally with mild bibasilar atelectasis.  IMPRESSION: Congestive heart failure with mild edema and small pleural effusions.   Original Report Authenticated By: DAVID C. CLARK, M.D.    Ct Head Wo Contrast  07/24/2012  *RADIOLOGY REPORT*  Clinical Data: Code stroke.  Slurred speech, left facial droop.  CT HEAD WITHOUT CONTRAST    Technique:  Contiguous axial images were obtained from the base of the skull through the vertex without contrast.  Comparison: 01/23/2009  Findings: There is atrophy and chronic small vessel disease changes. No acute intracranial abnormality.  Specifically, no hemorrhage, hydrocephalus, mass lesion, acute infarction, or significant intracranial injury.  No acute calvarial abnormality. Visualized paranasal sinuses and mastoids clear.  Orbital soft tissues unremarkable.  IMPRESSION: No acute intracranial abnormality.  Atrophy, chronic microvascular disease.   Original Report Authenticated By: KEVIN G. DOVER, M.D.    Nm Pulmonary Perf And Vent  07/26/2012  *RADIOLOGY REPORT*  Clinical Data: Chest pain and shortness of breath for the past week.  NM PULMONARY VENTILATION AND  PERFUSION SCAN  Radiopharmaceutical: 6MILLI CURIE MAA TECHNETIUM TO 99M ALBUMIN AGGREGATED and 40 mCi technetium 99m DTPA  Comparison: Chest radiographs obtained yesterday.  Findings: Normal ventilation of both lungs.  Normal perfusion of both lungs.  IMPRESSION: Normal examination.  No evidence of pulmonary embolism.   Original Report Authenticated By: STEVEN H. REID, M.D.     Current Medications:     . amLODipine  5 mg Oral Daily  . aspirin EC  81 mg Oral Daily  . enoxaparin (LOVENOX) injection  40 mg Subcutaneous Q24H  . furosemide  40 mg Intravenous Once  . furosemide  40 mg Oral BID  . hydrALAZINE  50 mg Oral Q8H  . insulin aspart  0-15 Units Subcutaneous TID WC  . insulin detemir  15 Units Subcutaneous Daily  . isosorbide mononitrate  30 mg Oral Daily  . losartan  50 mg Oral Daily  . metoprolol succinate  25 mg Oral Daily  . simvastatin  40 mg Oral q1800  . DISCONTD: hydrALAZINE  25 mg Oral Q8H  . DISCONTD: insulin detemir  30 Units Subcutaneous Daily    ASSESSMENT AND PLAN:  1. A/C diastolic CHF- still c/o dyspnea on mild exertion. On exam, fine bibasilar rales persist; however lungs have cleared up. O2 sat's high 90s on RA. I/O - 2.6 L. Weight 204 lbs which is at his baseline.  Cr bumped to 2.34. Hold off on AM Lasix dose. Hold ARB. He is euvolemic. V/Q scan negative for PE. No evidence to support pulmonary infxn. Question underlying pulmonary dysfunction given pulmonary HTN. May also represent long-standing L-sided diastolic dysfxn. Continue BB, hydralazine/imdur. Continue outpatient Lasix PO dosing tomorrow.   2. A/CKD, stage 4- Cr bumped with diuresis. Hold on AM Lasix dose.   3. CAD- formally ruled out. Still c/o intermittent L-sided chest tightness.  Cath 03/13 revealed 2V CAD s/p CABG x 3 w/ 2/3 patent grafts. Occluded SVG-Diag supplying small myocardial territory. No anatomic changes. Medically managed. Continue ASA/BB/statin/imdur.  4. HTN- much better controlled with  up-titrated hydralazine and addition of amlodipine.  5. Dyslipidemia- continue statin.   6. Type 2, DM- insulin adjusted. No morning hypoglycemia today.   Dispo: From a cardiac standpoint, patient has ruled out and is euvolemic. No evidence of PE or pulm infxn. ? Underlying pulmonary dysfxn or long-standing diastolic CHF attributing to DOE. He does have progressive pulmonary HTN on serial echo review. Hold Lasix today, resume tomorrow. Continue ARB in setting of CKD? Will need follow-up BMET if d/c today. +/- outpatient appointment with pulmonary for sleep study/PFTs. Will discuss further plans with MD.     Signed, R. Tony Arguello, PA-C 07/27/2012, 7:58 AM  Patient seen, examined. Available data reviewed. Agree with findings, assessment, and plan as outlined by Tony Arguello, PA-C. Pt with continued DOE with light   exertion (Class 3). Most significant findings are diastolic dysfunction and severe pulmonary HTN on echo. Unclear cause of PAH (diff dx includes diastolic CHF, OSA, chronic PE but VQ is ok, and connective tissue disease. Recommend right heart cath for hemodynamics - will schedule tomorrow, also will add ANA/RF to labs in the am. Can arrange outpatient sleep study to eval for OSA if needed. Risks, indication, alternatives to RHC reviewed with patient - will ask Dr McLean to do.  Jeffery Gammell, M.D. 07/27/2012 3:56 PM   

## 2012-07-28 NOTE — Progress Notes (Signed)
07/28/2012 11:23 AM Nursing note Theodore Demark PAC paged for clarification on post cath hydration orders. Orders received and enacted. Will continue to closely monitor patient.  Scotlynn Noyes, Blanchard Kelch

## 2012-07-29 DIAGNOSIS — I2789 Other specified pulmonary heart diseases: Secondary | ICD-10-CM

## 2012-07-29 DIAGNOSIS — I2510 Atherosclerotic heart disease of native coronary artery without angina pectoris: Secondary | ICD-10-CM

## 2012-07-29 DIAGNOSIS — N189 Chronic kidney disease, unspecified: Secondary | ICD-10-CM

## 2012-07-29 DIAGNOSIS — N289 Disorder of kidney and ureter, unspecified: Secondary | ICD-10-CM

## 2012-07-29 DIAGNOSIS — I2721 Secondary pulmonary arterial hypertension: Secondary | ICD-10-CM

## 2012-07-29 LAB — COMPREHENSIVE METABOLIC PANEL
ALT: 24 U/L (ref 0–53)
Alkaline Phosphatase: 93 U/L (ref 39–117)
BUN: 42 mg/dL — ABNORMAL HIGH (ref 6–23)
CO2: 24 mEq/L (ref 19–32)
Chloride: 100 mEq/L (ref 96–112)
GFR calc Af Amer: 23 mL/min — ABNORMAL LOW (ref >90)
GFR calc non Af Amer: 20 mL/min — ABNORMAL LOW (ref >90)
Glucose, Bld: 114 mg/dL — ABNORMAL HIGH (ref 70–99)
Potassium: 4.4 mEq/L (ref 3.5–5.1)
Sodium: 136 mEq/L (ref 135–145)
Total Bilirubin: 0.3 mg/dL (ref 0.3–1.2)
Total Protein: 7.1 g/dL (ref 6.0–8.3)

## 2012-07-29 LAB — CBC
HCT: 33.6 % — ABNORMAL LOW (ref 39.0–52.0)
Hemoglobin: 11 g/dL — ABNORMAL LOW (ref 13.0–17.0)
MCV: 87.7 fL (ref 78.0–100.0)
RBC: 3.83 MIL/uL — ABNORMAL LOW (ref 4.22–5.81)
WBC: 6.2 10*3/uL (ref 4.0–10.5)

## 2012-07-29 LAB — GLUCOSE, CAPILLARY
Glucose-Capillary: 180 mg/dL — ABNORMAL HIGH (ref 70–99)
Glucose-Capillary: 200 mg/dL — ABNORMAL HIGH (ref 70–99)
Glucose-Capillary: 166 mg/dL — ABNORMAL HIGH (ref 70–99)

## 2012-07-29 MED ORDER — ALUM & MAG HYDROXIDE-SIMETH 200-200-20 MG/5ML PO SUSP
15.0000 mL | ORAL | Status: DC | PRN
  Administered 2012-07-31 – 2012-08-07 (×4): 15 mL via ORAL
  Filled 2012-07-29 (×4): qty 30

## 2012-07-29 NOTE — Progress Notes (Signed)
Primary cardiologist: Dr. Olga Millers  Subjective:   No chest pain. Has not ambulated much as yet. No shortness of breath at rest. Denies any leg edema.   Objective:   Temp:  [97.6 F (36.4 C)-98.3 F (36.8 C)] 98.3 F (36.8 C) (11/02 0452) Pulse Rate:  [62-88] 86  (11/02 0452) Resp:  [18] 18  (11/02 0452) BP: (123-153)/(56-94) 153/71 mmHg (11/02 0452) SpO2:  [96 %-100 %] 100 % (11/02 0452) Weight:  [206 lb 5.6 oz (93.6 kg)] 206 lb 5.6 oz (93.6 kg) (11/02 0452) Last BM Date: 07/25/12  Filed Weights   07/27/12 0508 07/28/12 0312 07/29/12 0452  Weight: 204 lb (92.534 kg) 206 lb 9.1 oz (93.7 kg) 206 lb 5.6 oz (93.6 kg)    Intake/Output Summary (Last 24 hours) at 07/29/12 0952 Last data filed at 07/28/12 1300  Gross per 24 hour  Intake    360 ml  Output    400 ml  Net    -40 ml   Telemetry: Sinus rhythm with occasional PACs.  Exam:  General: No acute distress.  Lungs: Clear, nonlabored.  Cardiac: Regular rate and rhythm, accentuated S2.  Extremities: No pitting.  Lab Results:  Basic Metabolic Panel:  Lab 07/29/12 1610 07/28/12 1050 07/28/12 0600 07/27/12 0521  NA 136 -- 133* 137  K 4.4 -- 4.0 3.8  CL 100 -- 98 100  CO2 24 -- 28 28  GLUCOSE 114* -- 114* 139*  BUN 42* -- 39* 33*  CREATININE 2.83* 2.76* 2.94* --  CALCIUM 9.2 -- 9.4 9.8  MG -- -- -- --    Liver Function Tests:  Lab 07/29/12 0610 07/25/12 0307  AST 24 42*  ALT 24 49  ALKPHOS 93 96  BILITOT 0.3 0.2*  PROT 7.1 6.8  ALBUMIN 3.2* 3.1*    CBC:  Lab 07/29/12 0610 07/28/12 1050 07/24/12 1540  WBC 6.2 4.5 4.1  HGB 11.0* 10.4* 10.4*  HCT 33.6* 32.2* 31.5*  MCV 87.7 87.0 87.0  PLT 143* 141* 132*    Cardiac Enzymes:  Lab 07/25/12 0307 07/24/12 2120 07/24/12 1540  CKTOTAL -- -- --  CKMB -- -- --  CKMBINDEX -- -- --  TROPONINI <0.30 <0.30 <0.30     Medications:   Scheduled Medications:    . amLODipine  10 mg Oral Daily  . aspirin EC  81 mg Oral Daily  . atorvastatin  20  mg Oral q1800  . heparin  5,000 Units Subcutaneous Q8H  . hydrALAZINE  50 mg Oral Q8H  . insulin aspart  0-15 Units Subcutaneous TID WC  . insulin detemir  15 Units Subcutaneous Daily  . metoprolol succinate  25 mg Oral Daily  . sildenafil  20 mg Oral TID  . sodium chloride  3 mL Intravenous Q12H  . DISCONTD: enoxaparin (LOVENOX) injection  30 mg Subcutaneous Q24H  . DISCONTD: sodium chloride  3 mL Intravenous Q12H  . DISCONTD: sodium chloride  3 mL Intravenous Q12H     Infusions:    . DISCONTD: sodium chloride    . DISCONTD: sodium chloride       PRN Medications:  acetaminophen, ALPRAZolam, dextrose, hydrALAZINE, ondansetron (ZOFRAN) IV, sodium chloride, DISCONTD: sodium chloride, DISCONTD: sodium chloride, DISCONTD: acetaminophen, DISCONTD: ondansetron (ZOFRAN) IV, DISCONTD: sodium chloride, DISCONTD: sodium chloride   Assessment:   1. Pulmonary arterial hypertension, PASP 60 mmHg (mean 38) by right heart catheterization 11/1. Patient assessed by Dr. Shirlee Latch, recent VQ scan not suggesting chronic PE, and hemodynamics not clearly consistent with  pericardial constriction. Further workup is pending including labs, potential outpatient sleep study. Echocardiographic bubble study was negative for obvious shunt. He has been started on sildenafil.  2. Known multivessel CAD status post CABG with cardiac catheterization in March of this year noted in the H&P.  LVEF 50-55%.  3. Acute on chronic renal insufficiency, creatinine 2.0 up to 2.8. Patient hydrated and ARB held.  4. Hypertension, systolics running 120s to 150s.  Plan/Discussion:    Discuss results of RHC with patient today. Will plan to have him ambulate, check orthostatics with recent medication adjustments. Also check ambulatory  oxygen saturation on room air. Need to keep followup on renal function as well, ARB remains on hold. His Norvasc was increased yesterday. Depending on how he does clinically, may be able to aim for  discharge within the next 24 hours and continue workup as an outpatient.   Jonelle Sidle, M.D., F.A.C.C.

## 2012-07-29 NOTE — Progress Notes (Signed)
Ambulated 200 feet in hallway with this nurse. sats of 96% on room air.

## 2012-07-30 LAB — URINALYSIS, ROUTINE W REFLEX MICROSCOPIC
Bilirubin Urine: NEGATIVE
Hgb urine dipstick: NEGATIVE
Nitrite: NEGATIVE
Protein, ur: 100 mg/dL — AB
Urobilinogen, UA: 0.2 mg/dL (ref 0.0–1.0)

## 2012-07-30 LAB — GLUCOSE, CAPILLARY
Glucose-Capillary: 115 mg/dL — ABNORMAL HIGH (ref 70–99)
Glucose-Capillary: 106 mg/dL — ABNORMAL HIGH (ref 70–99)
Glucose-Capillary: 267 mg/dL — ABNORMAL HIGH (ref 70–99)
Glucose-Capillary: 112 mg/dL — ABNORMAL HIGH (ref 70–99)
Glucose-Capillary: 163 mg/dL — ABNORMAL HIGH (ref 70–99)
Glucose-Capillary: 214 mg/dL — ABNORMAL HIGH (ref 70–99)

## 2012-07-30 LAB — BASIC METABOLIC PANEL
CO2: 26 mEq/L (ref 19–32)
Calcium: 9 mg/dL (ref 8.4–10.5)
Creatinine, Ser: 3.08 mg/dL — ABNORMAL HIGH (ref 0.50–1.35)
GFR calc non Af Amer: 18 mL/min — ABNORMAL LOW (ref >90)
Sodium: 135 mEq/L (ref 135–145)

## 2012-07-30 LAB — CBC
MCV: 86.9 fL (ref 78.0–100.0)
Platelets: 140 10*3/uL — ABNORMAL LOW (ref 150–400)
RBC: 3.43 MIL/uL — ABNORMAL LOW (ref 4.22–5.81)
RDW: 15.3 % (ref 11.5–15.5)
WBC: 5 10*3/uL (ref 4.0–10.5)

## 2012-07-30 MED ORDER — SODIUM CHLORIDE 0.45 % IV SOLN
INTRAVENOUS | Status: DC
  Administered 2012-07-30 – 2012-07-31 (×2): via INTRAVENOUS

## 2012-07-30 NOTE — Progress Notes (Signed)
Primary cardiologist: Dr. Olga Millers  Subjective:   No chest pain. States that he felt "tired" in his abdomen when he was walking yesterday, not short of breath at rest. Has some mild discomfort in his abdomen when he initiates urination.   Objective:   Temp:  [98.3 F (36.8 C)-100.2 F (37.9 C)] 100.2 F (37.9 C) (11/03 0411) Pulse Rate:  [85-91] 90  (11/03 0411) Resp:  [18] 18  (11/03 0411) BP: (120-141)/(54-68) 141/62 mmHg (11/03 0411) SpO2:  [92 %-100 %] 97 % (11/03 0411) Last BM Date: 07/29/12  Filed Weights   07/27/12 0508 07/28/12 0312 07/29/12 0452  Weight: 204 lb (92.534 kg) 206 lb 9.1 oz (93.7 kg) 206 lb 5.6 oz (93.6 kg)    Intake/Output Summary (Last 24 hours) at 07/30/12 0809 Last data filed at 07/29/12 1757  Gross per 24 hour  Intake    960 ml  Output    500 ml  Net    460 ml   Telemetry: Sinus rhythm with occasional PACs, both conducted and nonconducted.  Exam:  General: No acute distress.  Lungs: Clear, nonlabored.  Cardiac: Regular rate and rhythm, accentuated S2.  Abdomen: Protuberant, bowel sounds present, nontender.  Extremities: No pitting.  Lab Results:  Basic Metabolic Panel:  Lab 07/30/12 2130 07/29/12 0610 07/28/12 1050 07/28/12 0600  NA 135 136 -- 133*  K 4.4 4.4 -- 4.0  CL 101 100 -- 98  CO2 26 24 -- 28  GLUCOSE 96 114* -- 114*  BUN 48* 42* -- 39*  CREATININE 3.08* 2.83* 2.76* --  CALCIUM 9.0 9.2 -- 9.4  MG -- -- -- --    Liver Function Tests:  Lab 07/29/12 0610 07/25/12 0307  AST 24 42*  ALT 24 49  ALKPHOS 93 96  BILITOT 0.3 0.2*  PROT 7.1 6.8  ALBUMIN 3.2* 3.1*    CBC:  Lab 07/30/12 0555 07/29/12 0610 07/28/12 1050  WBC 5.0 6.2 4.5  HGB 9.7* 11.0* 10.4*  HCT 29.8* 33.6* 32.2*  MCV 86.9 87.7 87.0  PLT 140* 143* 141*     Medications:   Scheduled Medications:    . amLODipine  10 mg Oral Daily  . aspirin EC  81 mg Oral Daily  . atorvastatin  20 mg Oral q1800  . heparin  5,000 Units Subcutaneous Q8H   . hydrALAZINE  50 mg Oral Q8H  . insulin aspart  0-15 Units Subcutaneous TID WC  . insulin detemir  15 Units Subcutaneous Daily  . metoprolol succinate  25 mg Oral Daily  . sildenafil  20 mg Oral TID  . sodium chloride  3 mL Intravenous Q12H    Infusions:    PRN Medications: acetaminophen, ALPRAZolam, alum & mag hydroxide-simeth, dextrose, hydrALAZINE, ondansetron (ZOFRAN) IV, sodium chloride   Assessment:   1. Pulmonary arterial hypertension, PASP 60 mmHg (mean 38) by right heart catheterization 11/1. Patient assessed by Dr. Shirlee Latch, recent VQ scan not suggesting chronic PE, and hemodynamics not clearly consistent with pericardial constriction. Further workup is pending including labs (HIV negative), potential outpatient sleep study. Echocardiographic bubble study was negative for obvious shunt. He has been started on sildenafil. No ambulatory oxygen desaturation.  2. Known multivessel CAD status post CABG with cardiac catheterization in March of this year noted in the H&P.  LVEF 50-55%.  3. Acute on chronic renal insufficiency, creatinine 2.0 up to 3.0 today. ARB held already.  4. Hypertension.  Plan/Discussion:    Will hydrate gently today, otherwise continue present medications. Also  check urinalysis. Followup BMET in a.m. Continue to ambulate with assistance. Depending on improvement in renal function, may be able to aim for discharge in the next 24 hours and continue workup as an outpatient. Also check orthostatics.  Jonelle Sidle, M.D., F.A.C.C.

## 2012-07-31 LAB — CBC
Platelets: 135 10*3/uL — ABNORMAL LOW (ref 150–400)
RDW: 15.2 % (ref 11.5–15.5)
WBC: 4.6 10*3/uL (ref 4.0–10.5)

## 2012-07-31 LAB — GLUCOSE, CAPILLARY
Glucose-Capillary: 93 mg/dL (ref 70–99)
Glucose-Capillary: 112 mg/dL — ABNORMAL HIGH (ref 70–99)
Glucose-Capillary: 210 mg/dL — ABNORMAL HIGH (ref 70–99)

## 2012-07-31 LAB — POCT I-STAT 3, VENOUS BLOOD GAS (G3P V)
TCO2: 30 mmol/L (ref 0–100)
pH, Ven: 7.363 — ABNORMAL HIGH (ref 7.250–7.300)

## 2012-07-31 LAB — BASIC METABOLIC PANEL
Calcium: 9.2 mg/dL (ref 8.4–10.5)
Creatinine, Ser: 3.01 mg/dL — ABNORMAL HIGH (ref 0.50–1.35)
GFR calc Af Amer: 21 mL/min — ABNORMAL LOW (ref >90)
Sodium: 134 mEq/L — ABNORMAL LOW (ref 135–145)

## 2012-07-31 LAB — ANA: Anti Nuclear Antibody(ANA): NEGATIVE

## 2012-07-31 NOTE — Progress Notes (Signed)
Primary cardiologist: Dr. Olga Millers  Subjective:   No chest pain. No DOE   Objective:   Temp:  [98 F (36.7 C)-98.9 F (37.2 C)] 98 F (36.7 C) (11/04 0325) Pulse Rate:  [77-86] 86  (11/04 0325) Resp:  [18] 18  (11/04 0325) BP: (123-156)/(57-68) 156/68 mmHg (11/04 0325) SpO2:  [97 %-99 %] 99 % (11/04 0325) Weight:  [206 lb 9.6 oz (93.713 kg)] 206 lb 9.6 oz (93.713 kg) (11/04 0325) Last BM Date: 07/30/12  Filed Weights   07/28/12 0312 07/29/12 0452 07/31/12 0325  Weight: 206 lb 9.1 oz (93.7 kg) 206 lb 5.6 oz (93.6 kg) 206 lb 9.6 oz (93.713 kg)    Intake/Output Summary (Last 24 hours) at 07/31/12 0732 Last data filed at 07/30/12 1900  Gross per 24 hour  Intake    480 ml  Output    600 ml  Net   -120 ml   Telemetry: Sinus rhythm with occasional PACs, both conducted and nonconducted. Occasional Mobitz 1  Exam:  General: No acute distress.  Lungs: Diminished BS bases  Cardiac: Regular rate and rhythm, accentuated S2.  Abdomen: Protuberant, bowel sounds present, nontender.  Extremities: No edema  Lab Results:  Basic Metabolic Panel:  Lab 07/31/12 9528 07/30/12 0555 07/29/12 0610  NA 134* 135 136  K 4.7 4.4 4.4  CL 100 101 100  CO2 25 26 24   GLUCOSE 120* 96 114*  BUN 55* 48* 42*  CREATININE 3.01* 3.08* 2.83*  CALCIUM 9.2 9.0 9.2  MG -- -- --    Liver Function Tests:  Lab 07/29/12 0610 07/25/12 0307  AST 24 42*  ALT 24 49  ALKPHOS 93 96  BILITOT 0.3 0.2*  PROT 7.1 6.8  ALBUMIN 3.2* 3.1*    CBC:  Lab 07/31/12 0530 07/30/12 0555 07/29/12 0610  WBC 4.6 5.0 6.2  HGB 9.8* 9.7* 11.0*  HCT 29.8* 29.8* 33.6*  MCV 86.4 86.9 87.7  PLT 135* 140* 143*     Medications:   Scheduled Medications:    . amLODipine  10 mg Oral Daily  . aspirin EC  81 mg Oral Daily  . atorvastatin  20 mg Oral q1800  . heparin  5,000 Units Subcutaneous Q8H  . hydrALAZINE  50 mg Oral Q8H  . insulin aspart  0-15 Units Subcutaneous TID WC  . insulin detemir  15  Units Subcutaneous Daily  . metoprolol succinate  25 mg Oral Daily  . sildenafil  20 mg Oral TID  . sodium chloride  3 mL Intravenous Q12H    Infusions:    . sodium chloride 50 mL/hr at 07/31/12 0336    PRN Medications: acetaminophen, ALPRAZolam, alum & mag hydroxide-simeth, dextrose, hydrALAZINE, ondansetron (ZOFRAN) IV, sodium chloride   Assessment:   1. Pulmonary arterial hypertension, PASP 60 mmHg (mean 38) by right heart catheterization 11/1. Patient assessed by Dr. Shirlee Latch, recent VQ scan not suggesting chronic PE, and hemodynamics not clearly consistent with pericardial constriction. Further workup is pending including labs (HIV negative), potential outpatient sleep study. Echocardiographic bubble study was negative for obvious shunt. He has been started on sildenafil.   2. Known multivessel CAD status post CABG with cardiac catheterization in March of this year noted in the H&P.  LVEF 50-55%.  3. Acute on chronic renal insufficiency, creatinine 2.0 up to 3.0 today. ARB held already.  4. Hypertension.  Plan/Discussion:    Renal function worse today; will continue to hold diuretics and ARB; DC IVFs; repeat BMET in AM. DC if  renal function stable; outpt sleep study and titration of sildenafil  Olga Millers, M.D., F.A.C.C.

## 2012-08-01 LAB — BASIC METABOLIC PANEL
CO2: 25 mEq/L (ref 19–32)
Calcium: 9.3 mg/dL (ref 8.4–10.5)
GFR calc non Af Amer: 18 mL/min — ABNORMAL LOW (ref >90)
Glucose, Bld: 173 mg/dL — ABNORMAL HIGH (ref 70–99)
Potassium: 4.6 mEq/L (ref 3.5–5.1)
Sodium: 133 mEq/L — ABNORMAL LOW (ref 135–145)

## 2012-08-01 LAB — GLUCOSE, CAPILLARY: Glucose-Capillary: 185 mg/dL — ABNORMAL HIGH (ref 70–99)

## 2012-08-01 MED ORDER — HYDRALAZINE HCL 50 MG PO TABS
75.0000 mg | ORAL_TABLET | Freq: Three times a day (TID) | ORAL | Status: DC
  Administered 2012-08-01 – 2012-08-15 (×42): 75 mg via ORAL
  Filled 2012-08-01 (×46): qty 1

## 2012-08-01 MED ORDER — SODIUM CHLORIDE 0.9 % IV SOLN
INTRAVENOUS | Status: DC
  Administered 2012-08-01: 07:00:00 via INTRAVENOUS

## 2012-08-01 NOTE — Progress Notes (Signed)
Pt amb 150 ft with no assist. Reported to have SOB. Pt rested with relief of SOB. Lungs CTA. Will continue to monitor pt.

## 2012-08-01 NOTE — Progress Notes (Signed)
Primary cardiologist: Dr. Olga Millers  Subjective:   No chest pain. Mild DOE   Objective:   Temp:  [97.8 F (36.6 C)-98.3 F (36.8 C)] 98.3 F (36.8 C) (11/05 0407) Pulse Rate:  [74-81] 74  (11/05 0407) Resp:  [18] 18  (11/05 0407) BP: (129-159)/(64-80) 129/72 mmHg (11/05 0407) SpO2:  [96 %-98 %] 98 % (11/05 0407) Weight:  [210 lb 4.8 oz (95.391 kg)] 210 lb 4.8 oz (95.391 kg) (11/05 0407) Last BM Date: 07/31/12  Filed Weights   07/29/12 0452 07/31/12 0325 08/01/12 0407  Weight: 206 lb 5.6 oz (93.6 kg) 206 lb 9.6 oz (93.713 kg) 210 lb 4.8 oz (95.391 kg)    Intake/Output Summary (Last 24 hours) at 08/01/12 0634 Last data filed at 07/31/12 1700  Gross per 24 hour  Intake    840 ml  Output    600 ml  Net    240 ml   Telemetry: Sinus rhythm with occasional PACs, both conducted and nonconducted. Occasional Mobitz 1  Exam:  General: No acute distress.  Lungs: Diminished BS bases  Cardiac: Regular rate and rhythm, accentuated S2. 2/6 systolic murmur  Abdomen: Protuberant, bowel sounds present, nontender.  Extremities: No edema  Lab Results:  Basic Metabolic Panel:  Lab 08/01/12 1610 07/31/12 0530 07/30/12 0555  NA 133* 134* 135  K 4.6 4.7 4.4  CL 99 100 101  CO2 25 25 26   GLUCOSE 173* 120* 96  BUN 65* 55* 48*  CREATININE 2.99* 3.01* 3.08*  CALCIUM 9.3 9.2 9.0  MG -- -- --    Liver Function Tests:  Lab 07/29/12 0610  AST 24  ALT 24  ALKPHOS 93  BILITOT 0.3  PROT 7.1  ALBUMIN 3.2*    CBC:  Lab 07/31/12 0530 07/30/12 0555 07/29/12 0610  WBC 4.6 5.0 6.2  HGB 9.8* 9.7* 11.0*  HCT 29.8* 29.8* 33.6*  MCV 86.4 86.9 87.7  PLT 135* 140* 143*     Medications:   Scheduled Medications:    . amLODipine  10 mg Oral Daily  . aspirin EC  81 mg Oral Daily  . atorvastatin  20 mg Oral q1800  . heparin  5,000 Units Subcutaneous Q8H  . hydrALAZINE  50 mg Oral Q8H  . insulin aspart  0-15 Units Subcutaneous TID WC  . insulin detemir  15 Units  Subcutaneous Daily  . metoprolol succinate  25 mg Oral Daily  . sildenafil  20 mg Oral TID  . sodium chloride  3 mL Intravenous Q12H    Infusions:    . [DISCONTINUED] sodium chloride 50 mL/hr at 07/31/12 0336    PRN Medications: acetaminophen, ALPRAZolam, alum & mag hydroxide-simeth, dextrose, hydrALAZINE, ondansetron (ZOFRAN) IV, sodium chloride   Assessment:   1. Pulmonary arterial hypertension, PASP 60 mmHg (mean 38) by right heart catheterization 11/1. Patient assessed by Dr. Shirlee Latch, recent VQ scan not suggesting chronic PE, and hemodynamics not clearly consistent with pericardial constriction. ANA and HIV negative; plan outpatient sleep study. Echocardiographic bubble study was negative for obvious shunt. He has been started on sildenafil; titrate as outpatient.   2. Known multivessel CAD status post CABG with cardiac catheterization in March of this year noted in the H&P.  LVEF 50-55%.  3. Acute on chronic renal insufficiency, creatinine 2.0 up to 3.0 today. ARB held already.  4. Hypertension.  Plan/Discussion:    Renal function worse today; will continue to hold diuretics and ARB; will gently hydrate today; repeat BMET in AM. DC if renal function  stable; outpt sleep study and titration of sildenafil. DC toprol (mobitz 1 noted on telemetry); increase hydralazine.  Olga Millers, M.D., F.A.C.C.

## 2012-08-02 ENCOUNTER — Inpatient Hospital Stay (HOSPITAL_COMMUNITY): Payer: Medicare Other

## 2012-08-02 DIAGNOSIS — E119 Type 2 diabetes mellitus without complications: Secondary | ICD-10-CM

## 2012-08-02 LAB — GLUCOSE, CAPILLARY
Glucose-Capillary: 146 mg/dL — ABNORMAL HIGH (ref 70–99)
Glucose-Capillary: 184 mg/dL — ABNORMAL HIGH (ref 70–99)
Glucose-Capillary: 121 mg/dL — ABNORMAL HIGH (ref 70–99)
Glucose-Capillary: 232 mg/dL — ABNORMAL HIGH (ref 70–99)

## 2012-08-02 LAB — BASIC METABOLIC PANEL
CO2: 23 mEq/L (ref 19–32)
Chloride: 99 mEq/L (ref 96–112)
Glucose, Bld: 163 mg/dL — ABNORMAL HIGH (ref 70–99)
Sodium: 132 mEq/L — ABNORMAL LOW (ref 135–145)

## 2012-08-02 MED ORDER — LINAGLIPTIN 5 MG PO TABS
5.0000 mg | ORAL_TABLET | Freq: Every day | ORAL | Status: DC
  Administered 2012-08-02 – 2012-08-20 (×18): 5 mg via ORAL
  Filled 2012-08-02 (×19): qty 1

## 2012-08-02 MED ORDER — FUROSEMIDE 10 MG/ML IJ SOLN
40.0000 mg | Freq: Once | INTRAMUSCULAR | Status: AC
  Administered 2012-08-02: 40 mg via INTRAVENOUS
  Filled 2012-08-02: qty 4

## 2012-08-02 NOTE — Progress Notes (Signed)
Primary cardiologist: Dr. Olga Millers  Subjective:   No chest pain. Mild DOE.  Breathing is about the same as yesterday. Weight is up 9 lb in past 2 days.   Objective:   Temp:  [97.4 F (36.3 C)-98.4 F (36.9 C)] 97.4 F (36.3 C) (11/06 0521) Pulse Rate:  [77-84] 80  (11/06 0521) Resp:  [18-24] 20  (11/06 0521) BP: (137-163)/(68-78) 163/78 mmHg (11/06 0521) SpO2:  [90 %-96 %] 90 % (11/06 0521) Weight:  [215 lb 14.4 oz (97.932 kg)] 215 lb 14.4 oz (97.932 kg) (11/06 0521) Last BM Date: 07/31/12  Filed Weights   07/31/12 0325 08/01/12 0407 08/02/12 0521  Weight: 206 lb 9.6 oz (93.713 kg) 210 lb 4.8 oz (95.391 kg) 215 lb 14.4 oz (97.932 kg)    Intake/Output Summary (Last 24 hours) at 08/02/12 0812 Last data filed at 08/02/12 0600  Gross per 24 hour  Intake 1390.83 ml  Output    600 ml  Net 790.83 ml   Telemetry: Sinus rhythm with first degree block.  Exam:  General: No acute distress.  Lungs: Diminished BS bases  Cardiac: Regular rate and rhythm, accentuated S2. 2/6 systolic murmur at [facility].  Abdomen: Protuberant, bowel sounds present, nontender.  Extremities: No edema  Lab Results:  Basic Metabolic Panel:  Lab 08/02/12 7846 08/01/12 0440 07/31/12 0530  NA 132* 133* 134*  K 4.5 4.6 4.7  CL 99 99 100  CO2 23 25 25   GLUCOSE 163* 173* 120*  BUN 66* 65* 55*  CREATININE 2.95* 2.99* 3.01*  CALCIUM 9.1 9.3 9.2  MG -- -- --    Liver Function Tests:  Lab 07/29/12 0610  AST 24  ALT 24  ALKPHOS 93  BILITOT 0.3  PROT 7.1  ALBUMIN 3.2*    CBC:  Lab 07/31/12 0530 07/30/12 0555 07/29/12 0610  WBC 4.6 5.0 6.2  HGB 9.8* 9.7* 11.0*  HCT 29.8* 29.8* 33.6*  MCV 86.4 86.9 87.7  PLT 135* 140* 143*     Medications:   Scheduled Medications:    . amLODipine  10 mg Oral Daily  . aspirin EC  81 mg Oral Daily  . atorvastatin  20 mg Oral q1800  . heparin  5,000 Units Subcutaneous Q8H  . hydrALAZINE  75 mg Oral Q8H  . insulin aspart  0-15 Units  Subcutaneous TID WC  . insulin detemir  15 Units Subcutaneous Daily  . linagliptin  5 mg Oral Daily  . sildenafil  20 mg Oral TID  . sodium chloride  3 mL Intravenous Q12H    Infusions:    . sodium chloride 50 mL/hr at 08/02/12 0600    PRN Medications: acetaminophen, ALPRAZolam, alum & mag hydroxide-simeth, dextrose, hydrALAZINE, ondansetron (ZOFRAN) IV, sodium chloride   Assessment:   1. Pulmonary arterial hypertension, PASP 60 mmHg (mean 38) by right heart catheterization 11/1. Patient assessed by Dr. Shirlee Latch, recent VQ scan not suggesting chronic PE, and hemodynamics not clearly consistent with pericardial constriction. ANA and HIV negative; plan outpatient sleep study. Echocardiographic bubble study was negative for obvious shunt. He has been started on sildenafil; titrate as outpatient.   2. Known multivessel CAD status post CABG with cardiac catheterization in March of this year noted in the H&P.  LVEF 50-55%.  3. Acute on chronic renal insufficiency, creatinine coming down but weight up 9 lb. Off diuretics. Will check followup chest xray.  4. Hypertension.  Plan/Discussion:    Recheck chest xray.  Continue to follow renal function closely. Outpt sleep  study and titration of sildenafil. Continue off BB because of previous AV block.  Cassell Clement

## 2012-08-02 NOTE — Progress Notes (Signed)
Subjective: Awake. No complaint  Objective: Lab: Lab Results  Component Value Date   WBC 4.6 07/31/2012   HGB 9.8* 07/31/2012   HCT 29.8* 07/31/2012   MCV 86.4 07/31/2012   PLT 135* 07/31/2012   BMET    Component Value Date/Time   NA 132* 08/02/2012 0450   K 4.5 08/02/2012 0450   CL 99 08/02/2012 0450   CO2 23 08/02/2012 0450   GLUCOSE 163* 08/02/2012 0450   BUN 66* 08/02/2012 0450   CREATININE 2.95* 08/02/2012 0450   CREATININE 1.89* 12/20/2011 1233   CALCIUM 9.1 08/02/2012 0450   GFRNONAA 19* 08/02/2012 0450   GFRAA 22* 08/02/2012 0450     Imaging:  Scheduled Meds:   . amLODipine  10 mg Oral Daily  . aspirin EC  81 mg Oral Daily  . atorvastatin  20 mg Oral q1800  . heparin  5,000 Units Subcutaneous Q8H  . hydrALAZINE  75 mg Oral Q8H  . insulin aspart  0-15 Units Subcutaneous TID WC  . insulin detemir  15 Units Subcutaneous Daily  . sildenafil  20 mg Oral TID  . sodium chloride  3 mL Intravenous Q12H   Continuous Infusions:   . sodium chloride 50 mL/hr at 08/02/12 0600   PRN Meds:.acetaminophen, ALPRAZolam, alum & mag hydroxide-simeth, dextrose, hydrALAZINE, ondansetron (ZOFRAN) IV, sodium chloride   Physical Exam: Filed Vitals:   08/02/12 0521  BP: 163/78  Pulse: 80  Temp: 97.4 F (36.3 C)  Resp: 20   Awake ane alert Pulm - no increased WOB at rest Neuro - great grip strength - as always.     Assessment/Plan: DM -  CBG (last 3)   Basename 08/02/12 0540 08/01/12 2117 08/01/12 1628  GLUCAP 146* 185* 156*    Plan - continue levemir at present dose  Add tradjenta 5 mg daily - will be able to go home on this. No renal adjustment needed.    Illene Regulus [facility] (o) [phone]; (c) [phone] Call-grp - [name] Tele: [phone]  08/02/2012, 7:10 AM

## 2012-08-02 NOTE — Progress Notes (Signed)
Pt reported SOB at rest. Expiratory wheezes noted in bases. CXR this AM showed worsening CHF. Notified PA and MD of results and pt status. Received orders to D/C IVF and give 40 mg Lasix. Will continue to monitor pt closely.

## 2012-08-03 LAB — BASIC METABOLIC PANEL
BUN: 67 mg/dL — ABNORMAL HIGH (ref 6–23)
Calcium: 9.4 mg/dL (ref 8.4–10.5)
Creatinine, Ser: 3.11 mg/dL — ABNORMAL HIGH (ref 0.50–1.35)
GFR calc Af Amer: 20 mL/min — ABNORMAL LOW (ref >90)
GFR calc non Af Amer: 18 mL/min — ABNORMAL LOW (ref >90)

## 2012-08-03 LAB — GLUCOSE, CAPILLARY
Glucose-Capillary: 125 mg/dL — ABNORMAL HIGH (ref 70–99)
Glucose-Capillary: 188 mg/dL — ABNORMAL HIGH (ref 70–99)
Glucose-Capillary: 173 mg/dL — ABNORMAL HIGH (ref 70–99)

## 2012-08-03 MED ORDER — FUROSEMIDE 10 MG/ML IJ SOLN
40.0000 mg | Freq: Two times a day (BID) | INTRAMUSCULAR | Status: DC
  Administered 2012-08-03: 40 mg via INTRAVENOUS
  Filled 2012-08-03: qty 4

## 2012-08-03 MED ORDER — FUROSEMIDE 10 MG/ML IJ SOLN
120.0000 mg | Freq: Two times a day (BID) | INTRAVENOUS | Status: DC
  Administered 2012-08-03 – 2012-08-04 (×2): 120 mg via INTRAVENOUS
  Filled 2012-08-03 (×3): qty 12

## 2012-08-03 NOTE — Consult Note (Addendum)
[name] is an 78 y.o. male referred by Dr [name]   Chief Complaint: Acute on CKD 3 HPI: 78 yo BM admitted 10/28 for CP and dyspnea.  Has hx of CKD with baseline Scr around 2.0.  On admision Scr 2.0 then increased to 2.9 on 11/1.  It has remained upper 2's to 3's since then  He had only 2.5l neg fluid balance before cr increased to 2.9 and is only 1l neg since admission per I/O data.  Interestingly, though, his weight is up 4kg.  He feels more SOB since admission and CXR shows increasing Rt pleural effusion and pulm edema.  He has had DM x 53yrs? And HTN for / duration.  No nephrotoxins given.  He had been on losartan but this was Dc'd on 11/1.  Past Medical History  Diagnosis Date  . CAD (coronary artery disease)     S/P CABG in 2006. stable anatomy by cath 12/21/11 with 2/3 patent bypass grafts - for med rx. Not on BB 2/2 second-degree AVB.  . LV dysfunction     Per Myoview in 2008; Ef 46% with prior inferior and apical infarct, no ischemia  . Cerebrovascular disease   . HLD (hyperlipidemia)   . Diastolic dysfunction   . Mediastinal adenopathy     per CT chest in 2006 with PET scan showing very limited metabolic activity; not felt to have a significant neoplastic potential  . Anemia   . Hypertension   . Diabetes mellitus     insulin dependent   . GERD (gastroesophageal reflux disease)   . AV block, Mobitz 1     Noted 11/2011 in hospital, BB stopped  . Colon polyps   . Diverticulosis     Past Surgical History  Procedure Date  . Appendectomy   . Coronary artery bypass graft 2006    SVG to OM2, SVG to LAD, SVG to DX; (the LIMA did not have good flow and therefore was not used)  . Cardiac catheterization 12/21/2011    Family History  Problem Relation Age of Onset  . Stomach cancer Sister   . Pancreatic cancer Brother   . Lung cancer Brother   . Colon cancer Neg Hx    Social History:  reports that he quit smoking about 58 years ago. He has never used smokeless tobacco. He  reports that he does not drink alcohol or use illicit drugs.  Allergies: No Known Allergies  Medications Prior to Admission  Medication Sig Dispense Refill  . aspirin EC 81 MG EC tablet Take 1 tablet (81 mg total) by mouth daily.      . furosemide (LASIX) 40 MG tablet Take 40 mg by mouth daily.      Marland Kitchen glimepiride (AMARYL) 2 MG tablet Take 1 tablet (2 mg total) by mouth daily before breakfast. Diabetes.  30 tablet  11  . hydrALAZINE (APRESOLINE) 25 MG tablet Take 1 tablet (25 mg total) by mouth every 8 (eight) hours.  90 tablet  6  . insulin detemir (LEVEMIR FLEXPEN) 100 UNIT/ML injection Inject 30 Units into the skin at bedtime.      . isosorbide mononitrate (IMDUR) 30 MG 24 hr tablet Take 1 tablet (30 mg total) by mouth daily.  30 tablet  11  . losartan (COZAAR) 50 MG tablet Take 50 mg by mouth daily.      . metoprolol succinate (TOPROL-XL) 25 MG 24 hr tablet Take 25 mg by mouth daily.      . nitroGLYCERIN (NITROSTAT)  0.4 MG SL tablet Place 0.4 mg under the tongue every 5 (five) minutes as needed. For chest pain      . simvastatin (ZOCOR) 40 MG tablet Take 1 tablet (40 mg total) by mouth daily.  30 tablet  3  . Insulin Pen Needle (NOVOFINE) 30G X 8 MM MISC Use as directed once daily  100 each  3  . Lite Touch Lancets MISC Inject 1 Device into the skin daily.  30 each  11  . ONE TOUCH ULTRA TEST test strip 1 each by Other route as needed.          Lab Results: UA: 100mg  protein.  0-2 rbc'c and wbc's No results found for this basename: WBC:3,HGB:3,HCT:3,PLT:3 in the last 72 hours BMET  Basename 08/03/12 0515 08/02/12 0450 08/01/12 0440  NA 132* 132* 133*  K 4.8 4.5 4.6  CL 98 99 99  CO2 24 23 25   GLUCOSE 134* 163* 173*  BUN 67* 66* 65*  CREATININE 3.11* 2.95* 2.99*  CALCIUM 9.4 9.1 9.3  PHOS -- -- --   LFT No results found for this basename: PROT,ALBUMIN,AST,ALT,ALKPHOS,BILITOT,BILIDIR,IBILI in the last 72 hours Dg Chest 2 View  08/02/2012  *RADIOLOGY REPORT*  Clinical Data:  Chest pain, shortness of breath  CHEST - 2 VIEW  Comparison: 07/25/2012  Findings: Coronary bypass changes noted.  Heart is enlarged with asymmetric airspace disease versus edema.  Enlarging right pleural effusion.  CHF is favored.  No pneumothorax.  IMPRESSION: Slight worsening asymmetric airspace disease versus edema with an enlarging right pleural effusion.  Worsening CHF is favored.   Original Report Authenticated By: Judie Petit. Shick, M.D.     ROS: Appetite fair No CP  +SOB No change in bowels No rashes No dysuria No vision change Chronic mild edema  PHYSICAL EXAM: Blood pressure 140/78, pulse 84, temperature 98.3 F (36.8 C), temperature source Oral, resp. rate 18, height 5\' 11"  (1.803 m), weight 98.2 kg (216 lb 7.9 oz), SpO2 93.00%. HEENT: PEERLA EOMI NECK:+ jVD  No bruits LUNGS:Few crackles in lt base.  Decreased BS rt base CARDIAC:RRR with 2/6 systolic murmur LSB  No gallop or rub ABD:+ BS NT ND no HSM.  No bruits WUJ:WJXBJ-4+ edema NEURO:CNI M7S intact no asterixis. Ox3  Assessment: 1. Acute on CKD 3 which I suspect are related to hemodynamic changes from ARB and diuresis in the face of diastolic dysfunction and I also suspect some Rt heart dysfunction.  Will RO hydro.  He is clearly volume overloaded and needs diuresis at this time 2. Pulm Art HTN 3. HTN 4. DM 5. Rt pleural effusion PLAN: 1. Cont with diuresis but will increase lasix 2. Check renalUS 3. Check spep 4. Check una and ucr 5. Daily Scr 6. Check iron studies 7. Check pth 8. If SOB worsens then may need to consider Rt thoracentesis   Arnaldo Heffron T 08/03/2012, 2:42 PM

## 2012-08-03 NOTE — Progress Notes (Signed)
Primary cardiologist: Dr. Olga Cooke  Subjective:   No chest pain. Increasing dyspnea   Objective:   Temp:  [98.1 F (36.7 C)-98.8 F (37.1 C)] 98.1 F (36.7 C) (11/07 0527) Pulse Rate:  [87-88] 88  (11/07 0527) Resp:  [19-20] 19  (11/07 0527) BP: (137-151)/(59-79) 151/74 mmHg (11/07 0527) SpO2:  [92 %-97 %] 93 % (11/07 0527) Weight:  [216 lb 7.9 oz (98.2 kg)] 216 lb 7.9 oz (98.2 kg) (11/07 0527) Last BM Date: 08/02/12  Filed Weights   08/01/12 0407 08/02/12 0521 08/03/12 0527  Weight: 210 lb 4.8 oz (95.391 kg) 215 lb 14.4 oz (97.932 kg) 216 lb 7.9 oz (98.2 kg)    Intake/Output Summary (Last 24 hours) at 08/03/12 0706 Last data filed at 08/03/12 0539  Gross per 24 hour  Intake    240 ml  Output    450 ml  Net   -210 ml    Exam:  General: No acute distress.  Lungs: Diminished BS bases worse compared to previous  Cardiac: Regular rate and rhythm, accentuated S2. 2/6 systolic murmur  Abdomen: Protuberant, bowel sounds present, nontender.  Extremities: No edema  Lab Results:  Basic Metabolic Panel:  Lab 08/03/12 1610 08/02/12 0450 08/01/12 0440  NA 132* 132* 133*  K 4.8 4.5 4.6  CL 98 99 99  CO2 24 23 25   GLUCOSE 134* 163* 173*  BUN 67* 66* 65*  CREATININE 3.11* 2.95* 2.99*  CALCIUM 9.4 9.1 9.3  MG -- -- --    Liver Function Tests:  Lab 07/29/12 0610  AST 24  ALT 24  ALKPHOS 93  BILITOT 0.3  PROT 7.1  ALBUMIN 3.2*    CBC:  Lab 07/31/12 0530 07/30/12 0555 07/29/12 0610  WBC 4.6 5.0 6.2  HGB 9.8* 9.7* 11.0*  HCT 29.8* 29.8* 33.6*  MCV 86.4 86.9 87.7  PLT 135* 140* 143*     Medications:   Scheduled Medications:    . amLODipine  10 mg Oral Daily  . aspirin EC  81 mg Oral Daily  . atorvastatin  20 mg Oral q1800  . [COMPLETED] furosemide  40 mg Intravenous Once  . heparin  5,000 Units Subcutaneous Q8H  . hydrALAZINE  75 mg Oral Q8H  . insulin aspart  0-15 Units Subcutaneous TID WC  . insulin detemir  15 Units Subcutaneous Daily   . linagliptin  5 mg Oral Daily  . sildenafil  20 mg Oral TID  . sodium chloride  3 mL Intravenous Q12H    Infusions:    . [DISCONTINUED] sodium chloride 50 mL/hr at 08/02/12 0600    PRN Medications: acetaminophen, ALPRAZolam, alum & mag hydroxide-simeth, dextrose, hydrALAZINE, ondansetron (ZOFRAN) IV, sodium chloride   Assessment:   1. Pulmonary arterial hypertension, PASP 60 mmHg (mean 38) by right heart catheterization 11/1. Patient assessed by Dr. Shirlee Cooke, recent VQ scan not suggesting chronic PE, and hemodynamics not clearly consistent with pericardial constriction. ANA and HIV negative; plan outpatient sleep study. Echocardiographic bubble study was negative for obvious shunt. He has been started on sildenafil; titrate as outpatient.   2. Known multivessel CAD status post CABG with cardiac catheterization in March of this year noted in the H&P.  LVEF 50-55%.  3. Acute on chronic renal insufficiency   4. Hypertension.  Plan/Discussion:    Renal function worse today; patient also with volume excess (CHF on chest xray; diminished BS c/w effusions; increasing dyspnea); will resume lasix at 40 mg BID; follow renal function; nephrology consult.  [name]  [name], M.D., F.A.C.C.

## 2012-08-04 ENCOUNTER — Inpatient Hospital Stay (HOSPITAL_COMMUNITY): Payer: Medicare Other

## 2012-08-04 LAB — IRON AND TIBC
Saturation Ratios: 7 % — ABNORMAL LOW (ref 20–55)
TIBC: 279 ug/dL (ref 215–435)

## 2012-08-04 LAB — SODIUM, URINE, RANDOM: Sodium, Ur: 48 mEq/L

## 2012-08-04 LAB — RENAL FUNCTION PANEL
Albumin: 3.3 g/dL — ABNORMAL LOW (ref 3.5–5.2)
Chloride: 97 mEq/L (ref 96–112)
GFR calc non Af Amer: 15 mL/min — ABNORMAL LOW (ref >90)
Potassium: 4.9 mEq/L (ref 3.5–5.1)

## 2012-08-04 LAB — GLUCOSE, CAPILLARY: Glucose-Capillary: 120 mg/dL — ABNORMAL HIGH (ref 70–99)

## 2012-08-04 LAB — FERRITIN: Ferritin: 147 ng/mL (ref 22–322)

## 2012-08-04 LAB — PROTEIN / CREATININE RATIO, URINE: Total Protein, Urine: 37.8 mg/dL

## 2012-08-04 MED ORDER — FERROUS SULFATE 325 (65 FE) MG PO TABS
325.0000 mg | ORAL_TABLET | Freq: Two times a day (BID) | ORAL | Status: DC
  Administered 2012-08-04 – 2012-08-20 (×32): 325 mg via ORAL
  Filled 2012-08-04 (×36): qty 1

## 2012-08-04 MED ORDER — FUROSEMIDE 10 MG/ML IJ SOLN
160.0000 mg | Freq: Four times a day (QID) | INTRAVENOUS | Status: DC
  Administered 2012-08-04 – 2012-08-07 (×12): 160 mg via INTRAVENOUS
  Filled 2012-08-04 (×17): qty 16

## 2012-08-04 NOTE — Progress Notes (Signed)
Primary cardiologist: Dr. Olga Millers  Subjective:   No chest pain. Continues with dyspnea (slightly improved compared to previous)   Objective:   Temp:  [98 F (36.7 C)-99.1 F (37.3 C)] 99.1 F (37.3 C) (11/08 0429) Pulse Rate:  [84-89] 89  (11/08 0429) Resp:  [18-19] 19  (11/08 0429) BP: (138-141)/(69-78) 141/69 mmHg (11/08 0429) SpO2:  [92 %-96 %] 96 % (11/08 0429) Weight:  [217 lb 2.5 oz (98.5 kg)] 217 lb 2.5 oz (98.5 kg) (11/08 0429) Last BM Date: 08/02/12  Filed Weights   08/02/12 0521 08/03/12 0527 08/04/12 0429  Weight: 215 lb 14.4 oz (97.932 kg) 216 lb 7.9 oz (98.2 kg) 217 lb 2.5 oz (98.5 kg)    Intake/Output Summary (Last 24 hours) at 08/04/12 0640 Last data filed at 08/04/12 0625  Gross per 24 hour  Intake    720 ml  Output    825 ml  Net   -105 ml    Exam:  General: No acute distress.  Lungs: Diminished BS bases (R>L)  Cardiac: Regular rate and rhythm, accentuated S2. 2/6 systolic murmur  Abdomen: Protuberant, bowel sounds present, nontender.  Extremities: trace edema  Lab Results:  Basic Metabolic Panel:  Lab 08/03/12 1191 08/02/12 0450 08/01/12 0440  NA 132* 132* 133*  K 4.8 4.5 4.6  CL 98 99 99  CO2 24 23 25   GLUCOSE 134* 163* 173*  BUN 67* 66* 65*  CREATININE 3.11* 2.95* 2.99*  CALCIUM 9.4 9.1 9.3  MG -- -- --    Liver Function Tests:  Lab 07/29/12 0610  AST 24  ALT 24  ALKPHOS 93  BILITOT 0.3  PROT 7.1  ALBUMIN 3.2*    CBC:  Lab 07/31/12 0530 07/30/12 0555 07/29/12 0610  WBC 4.6 5.0 6.2  HGB 9.8* 9.7* 11.0*  HCT 29.8* 29.8* 33.6*  MCV 86.4 86.9 87.7  PLT 135* 140* 143*     Medications:   Scheduled Medications:    . amLODipine  10 mg Oral Daily  . aspirin EC  81 mg Oral Daily  . atorvastatin  20 mg Oral q1800  . furosemide  120 mg Intravenous BID  . heparin  5,000 Units Subcutaneous Q8H  . hydrALAZINE  75 mg Oral Q8H  . insulin aspart  0-15 Units Subcutaneous TID WC  . insulin detemir  15 Units  Subcutaneous Daily  . linagliptin  5 mg Oral Daily  . sildenafil  20 mg Oral TID  . sodium chloride  3 mL Intravenous Q12H  . [DISCONTINUED] furosemide  40 mg Intravenous BID    Infusions:    PRN Medications: acetaminophen, ALPRAZolam, alum & mag hydroxide-simeth, dextrose, hydrALAZINE, ondansetron (ZOFRAN) IV, sodium chloride   Assessment:   1. Pulmonary arterial hypertension, PASP 60 mmHg (mean 38) by right heart catheterization 11/1. Recent VQ scan not suggesting chronic PE, and hemodynamics not clearly consistent with pericardial constriction. ANA and HIV negative; plan outpatient sleep study. Echocardiographic bubble study was negative for obvious shunt. He has been started on sildenafil; titrate as outpatient.   2. Known multivessel CAD status post CABG with cardiac catheterization in March of this year noted in the H&P.  LVEF 50-55%.  3. Acute on chronic renal insufficiency   4. Hypertension.  Plan/Discussion:    Patient continues with volume excess (CHF on chest xray; diminished BS c/w effusions - R>L); will continue lasix at present dose; follow renal function; nephrology following; difficult situation. Will consider right thoracentesis if patient does not improve with diuresis.  Olga Millers, M.D., F.A.C.C.

## 2012-08-04 NOTE — Progress Notes (Signed)
[facility] ROUNDING NOTE   Subjective:   Interval History: still dyspneic to conversation  Objective:  Vital signs in last 24 hours:  Temp:  [98 F (36.7 C)-99.1 F (37.3 C)] 99.1 F (37.3 C) (11/08 0429) Pulse Rate:  [84-89] 89  (11/08 0429) Resp:  [18-19] 19  (11/08 0429) BP: (138-141)/(69-78) 141/69 mmHg (11/08 0429) SpO2:  [92 %-96 %] 96 % (11/08 0429) Weight:  [98.5 kg (217 lb 2.5 oz)] 98.5 kg (217 lb 2.5 oz) (11/08 0429)  Weight change: 0.3 kg (10.6 oz) Filed Weights   08/02/12 0521 08/03/12 0527 08/04/12 0429  Weight: 97.932 kg (215 lb 14.4 oz) 98.2 kg (216 lb 7.9 oz) 98.5 kg (217 lb 2.5 oz)    Intake/Output: I/O last 3 completed shifts: In: 720 [P.O.:720] Out: 1025 [Urine:1025]   Intake/Output this shift:  Total I/O In: -  Out: 400 [Urine:400]  CVS- RRR loud systloic murmur RS- rales at base ABD- BS present soft non-distended EXT- trace edema   Basic Metabolic Panel:  Lab 08/04/12 0981 08/03/12 0515 08/02/12 0450 08/01/12 0440 07/31/12 0530  NA 132* 132* 132* 133* 134*  K 4.9 4.8 4.5 4.6 4.7  CL 97 98 99 99 100  CO2 24 24 23 25 25   GLUCOSE 122* 134* 163* 173* 120*  BUN 71* 67* 66* 65* 55*  CREATININE 3.60* 3.11* 2.95* 2.99* 3.01*  CALCIUM 9.2 9.4 9.1 -- --  MG -- -- -- -- --  PHOS 4.7* -- -- -- --    Liver Function Tests:  Lab 08/04/12 0530 07/29/12 0610  AST -- 24  ALT -- 24  ALKPHOS -- 93  BILITOT -- 0.3  PROT -- 7.1  ALBUMIN 3.3* 3.2*   No results found for this basename: LIPASE:5,AMYLASE:5 in the last 168 hours No results found for this basename: AMMONIA:3 in the last 168 hours  CBC:  Lab 07/31/12 0530 07/30/12 0555 07/29/12 0610  WBC 4.6 5.0 6.2  NEUTROABS -- -- --  HGB 9.8* 9.7* 11.0*  HCT 29.8* 29.8* 33.6*  MCV 86.4 86.9 87.7  PLT 135* 140* 143*    Cardiac Enzymes: No results found for this basename: CKTOTAL:5,CKMB:5,CKMBINDEX:5,TROPONINI:5 in the last 168 hours  BNP: No components found with this  basename: POCBNP:5  CBG:  Lab 08/04/12 0624 08/03/12 2052 08/03/12 1614 08/03/12 1115 08/03/12 0713  GLUCAP 120* 173* 188* 149* 125*    Microbiology: Results for orders placed in visit on 04/10/12  HELICOBACTER PYLORI SCREEN-BIOPSY     Status: Normal   Collection Time   04/10/12  4:18 PM      Component Value Range Status Comment   UREASE Negative  Negative Final     Coagulation Studies: No results found for this basename: LABPROT:5,INR:5 in the last 72 hours  Urinalysis: No results found for this basename: COLORURINE:2,APPERANCEUR:2,LABSPEC:2,PHURINE:2,GLUCOSEU:2,HGBUR:2,BILIRUBINUR:2,KETONESUR:2,PROTEINUR:2,UROBILINOGEN:2,NITRITE:2,LEUKOCYTESUR:2 in the last 72 hours    Imaging: US Renal  08/04/2012  *RADIOLOGY REPORT*  Clinical Data: Acute on chronic kidney disease  RENAL/URINARY TRACT ULTRASOUND COMPLETE  Comparison:  11/01/2002  Findings:  Right Kidney:  Right kidney is diffusely echogenic.  It is normal in size with no masses, stones or hydronephrosis.  Right kidney measures 10.8 cm.  Left Kidney:  Left kidney is diffusely echogenic.  It is normal in size with no masses, stones or hydronephrosis.  It measures 10.6 cm.  Bladder:  Unremarkable.  Bilateral pleural effusions are incidentally noted.  IMPRESSION: Diffusely echogenic kidneys consistent with medical renal disease. Kidneys remain normal in size.  No  hydronephrosis or renal masses.   Original Report Authenticated By: Amie Portland, M.D.      Medications:        . amLODipine  10 mg Oral Daily  . aspirin EC  81 mg Oral Daily  . atorvastatin  20 mg Oral q1800  . furosemide  120 mg Intravenous BID  . furosemide  160 mg Intravenous Q6H  . heparin  5,000 Units Subcutaneous Q8H  . hydrALAZINE  75 mg Oral Q8H  . insulin aspart  0-15 Units Subcutaneous TID WC  . insulin detemir  15 Units Subcutaneous Daily  . linagliptin  5 mg Oral Daily  . sildenafil  20 mg Oral TID  . sodium chloride  3 mL Intravenous Q12H  .  [DISCONTINUED] furosemide  40 mg Intravenous BID   acetaminophen, ALPRAZolam, alum & mag hydroxide-simeth, dextrose, hydrALAZINE, ondansetron (ZOFRAN) IV, sodium chloride  Assessment/ Plan:  Pulmonary arterial hypertension, PASP 60 mmHg (mean 38) by right heart catheterization 11/1. Recent VQ scan not suggesting chronic PE, and hemodynamics not clearly consistent with pericardial constriction. ANA and HIV negative; plan outpatient sleep study. Echocardiographic bubble study was negative for obvious shunt. He has been started on sildenafil; titrate as outpatient.   Chronic renal failure. Creatinine has worsened with diuresis  HTN  Will increase lasix  Anemia hb 9.8 iron stores low will give iron orally for now due to heart failure  Bones PTH pending  Increase diuretics to 160 q  6hours   LOS: 11 Aireonna Bauer W @TODAY @11 :35 AM

## 2012-08-05 LAB — BASIC METABOLIC PANEL
CO2: 25 mEq/L (ref 19–32)
GFR calc non Af Amer: 14 mL/min — ABNORMAL LOW (ref >90)
Glucose, Bld: 124 mg/dL — ABNORMAL HIGH (ref 70–99)
Potassium: 4.3 mEq/L (ref 3.5–5.1)
Sodium: 131 mEq/L — ABNORMAL LOW (ref 135–145)

## 2012-08-05 LAB — GLUCOSE, CAPILLARY
Glucose-Capillary: 116 mg/dL — ABNORMAL HIGH (ref 70–99)
Glucose-Capillary: 144 mg/dL — ABNORMAL HIGH (ref 70–99)
Glucose-Capillary: 198 mg/dL — ABNORMAL HIGH (ref 70–99)

## 2012-08-05 MED ORDER — METOPROLOL SUCCINATE 12.5 MG HALF TABLET
12.5000 mg | ORAL_TABLET | Freq: Every day | ORAL | Status: DC
  Administered 2012-08-05 – 2012-08-15 (×11): 12.5 mg via ORAL
  Filled 2012-08-05 (×12): qty 1

## 2012-08-05 NOTE — Progress Notes (Addendum)
Primary cardiologist: Dr. Olga Millers  Subjective:   No chest pain. Less dyspneic.  Weight down 1 lb since yesterday. Voiding well.    Objective:   Temp:  [96.6 F (35.9 C)-98.5 F (36.9 C)] 98.5 F (36.9 C) (11/09 0443) Pulse Rate:  [85-91] 91  (11/09 0443) Resp:  [18] 18  (11/09 0443) BP: (128-140)/(62-78) 131/78 mmHg (11/09 1028) SpO2:  [92 %-94 %] 92 % (11/09 0443) Weight:  [214 lb 1.1 oz (97.1 kg)] 214 lb 1.1 oz (97.1 kg) (11/09 0443) Last BM Date: 08/02/12  Filed Weights   08/03/12 0527 08/04/12 0429 08/05/12 0443  Weight: 216 lb 7.9 oz (98.2 kg) 217 lb 2.5 oz (98.5 kg) 214 lb 1.1 oz (97.1 kg)    Intake/Output Summary (Last 24 hours) at 08/05/12 1107 Last data filed at 08/05/12 1051  Gross per 24 hour  Intake    443 ml  Output   2925 ml  Net  -2482 ml    Exam:  General: No acute distress.  Lungs: Diminished BS bases (R>L)  Cardiac: Regular rate and rhythm, accentuated S2. 2/6 systolic murmur  Abdomen: Protuberant, bowel sounds present, nontender.  Extremities: trace edema  Lab Results:  Basic Metabolic Panel:  Lab 08/05/12 1610 08/04/12 0530 08/03/12 0515  NA 131* 132* 132*  K 4.3 4.9 4.8  CL 94* 97 98  CO2 25 24 24   GLUCOSE 124* 122* 134*  BUN 79* 71* 67*  CREATININE 3.73* 3.60* 3.11*  CALCIUM 9.3 9.2 9.4  MG -- -- --    Liver Function Tests:  Lab 08/04/12 0530  AST --  ALT --  ALKPHOS --  BILITOT --  PROT --  ALBUMIN 3.3*    CBC:  Lab 07/31/12 0530 07/30/12 0555  WBC 4.6 5.0  HGB 9.8* 9.7*  HCT 29.8* 29.8*  MCV 86.4 86.9  PLT 135* 140*     Medications:   Scheduled Medications:    . amLODipine  10 mg Oral Daily  . aspirin EC  81 mg Oral Daily  . atorvastatin  20 mg Oral q1800  . ferrous sulfate  325 mg Oral BID WC  . furosemide  160 mg Intravenous Q6H  . heparin  5,000 Units Subcutaneous Q8H  . hydrALAZINE  75 mg Oral Q8H  . insulin aspart  0-15 Units Subcutaneous TID WC  . insulin detemir  15 Units  Subcutaneous Daily  . linagliptin  5 mg Oral Daily  . sildenafil  20 mg Oral TID  . sodium chloride  3 mL Intravenous Q12H  . [DISCONTINUED] furosemide  120 mg Intravenous BID    Infusions:    PRN Medications: acetaminophen, ALPRAZolam, alum & mag hydroxide-simeth, dextrose, hydrALAZINE, ondansetron (ZOFRAN) IV, sodium chloride   Assessment:   1. Pulmonary arterial hypertension, PASP 60 mmHg (mean 38) by right heart catheterization 11/1. Recent VQ scan not suggesting chronic PE, and hemodynamics not clearly consistent with pericardial constriction. ANA and HIV negative; plan outpatient sleep study. Echocardiographic bubble study was negative for obvious shunt. He has been started on sildenafil; titrate as outpatient.   2. Known multivessel CAD status post CABG with cardiac catheterization in March of this year noted in the H&P.  LVEF 50-55%.  3. Acute on chronic renal insufficiency   4. Hypertension.  5. Wide complex tachycardia.  11 beats this am. Not presently on BB.  Was on toprol at home.  Will restart lower dose BB. Past Mobitz I noted.  Plan/Discussion:    Patient continues with volume  excess (CHF on chest xray; diminished BS c/w effusions - R>L); will continue lasix at present dose; follow renal function; nephrology following; difficult situation. Will consider right thoracentesis if patient does not improve with diuresis.  Cassell Clement

## 2012-08-05 NOTE — Progress Notes (Signed)
[facility] ROUNDING NOTE   Subjective:   Interval History:appears to be symptomatically improved  Objective:  Vital signs in last 24 hours:  Temp:  [96.6 F (35.9 C)-98.5 F (36.9 C)] 98.5 F (36.9 C) (11/09 0443) Pulse Rate:  [85-91] 91  (11/09 0443) Resp:  [18] 18  (11/09 0443) BP: (128-140)/(62-71) 140/62 mmHg (11/09 0443) SpO2:  [92 %-94 %] 92 % (11/09 0443) Weight:  [97.1 kg (214 lb 1.1 oz)] 97.1 kg (214 lb 1.1 oz) (11/09 0443)  Weight change: -1.4 kg (-3 lb 1.4 oz) Filed Weights   08/03/12 0527 08/04/12 0429 08/05/12 0443  Weight: 98.2 kg (216 lb 7.9 oz) 98.5 kg (217 lb 2.5 oz) 97.1 kg (214 lb 1.1 oz)    Intake/Output: I/O last 3 completed shifts: In: 200 [P.O.:200] Out: 3075 [Urine:3075]   Intake/Output this shift:     CVS- RRR loud systloic murmur  RS- rales at base  ABD- BS present soft non-distended  EXT- trace edema    Basic Metabolic Panel:  Lab 08/05/12 4540 08/04/12 0530 08/03/12 0515 08/02/12 0450 08/01/12 0440  NA 131* 132* 132* 132* 133*  K 4.3 4.9 4.8 4.5 4.6  CL 94* 97 98 99 99  CO2 25 24 24 23 25   GLUCOSE 124* 122* 134* 163* 173*  BUN 79* 71* 67* 66* 65*  CREATININE 3.73* 3.60* 3.11* 2.95* 2.99*  CALCIUM 9.3 9.2 9.4 -- --  MG -- -- -- -- --  PHOS -- 4.7* -- -- --    Liver Function Tests:  Lab 08/04/12 0530  AST --  ALT --  ALKPHOS --  BILITOT --  PROT --  ALBUMIN 3.3*   No results found for this basename: LIPASE:5,AMYLASE:5 in the last 168 hours No results found for this basename: AMMONIA:3 in the last 168 hours  CBC:  Lab 07/31/12 0530 07/30/12 0555  WBC 4.6 5.0  NEUTROABS -- --  HGB 9.8* 9.7*  HCT 29.8* 29.8*  MCV 86.4 86.9  PLT 135* 140*    Cardiac Enzymes: No results found for this basename: CKTOTAL:5,CKMB:5,CKMBINDEX:5,TROPONINI:5 in the last 168 hours  BNP: No components found with this basename: POCBNP:5  CBG:  Lab 08/05/12 0621 08/04/12 2140 08/04/12 1604 08/04/12 1103 08/04/12 0624    GLUCAP 116* 187* 187* 155* 120*    Microbiology: Results for orders placed in visit on 04/10/12  HELICOBACTER PYLORI SCREEN-BIOPSY     Status: Normal   Collection Time   04/10/12  4:18 PM      Component Value Range Status Comment   UREASE Negative  Negative Final     Coagulation Studies: No results found for this basename: LABPROT:5,INR:5 in the last 72 hours  Urinalysis: No results found for this basename: COLORURINE:2,APPERANCEUR:2,LABSPEC:2,PHURINE:2,GLUCOSEU:2,HGBUR:2,BILIRUBINUR:2,KETONESUR:2,PROTEINUR:2,UROBILINOGEN:2,NITRITE:2,LEUKOCYTESUR:2 in the last 72 hours    Imaging: US Renal  08/04/2012  *RADIOLOGY REPORT*  Clinical Data: Acute on chronic kidney disease  RENAL/URINARY TRACT ULTRASOUND COMPLETE  Comparison:  11/01/2002  Findings:  Right Kidney:  Right kidney is diffusely echogenic.  It is normal in size with no masses, stones or hydronephrosis.  Right kidney measures 10.8 cm.  Left Kidney:  Left kidney is diffusely echogenic.  It is normal in size with no masses, stones or hydronephrosis.  It measures 10.6 cm.  Bladder:  Unremarkable.  Bilateral pleural effusions are incidentally noted.  IMPRESSION: Diffusely echogenic kidneys consistent with medical renal disease. Kidneys remain normal in size.  No hydronephrosis or renal masses.   Original Report Authenticated By: Amie Portland, M.D.  Medications:        . amLODipine  10 mg Oral Daily  . aspirin EC  81 mg Oral Daily  . atorvastatin  20 mg Oral q1800  . ferrous sulfate  325 mg Oral BID WC  . furosemide  160 mg Intravenous Q6H  . heparin  5,000 Units Subcutaneous Q8H  . hydrALAZINE  75 mg Oral Q8H  . insulin aspart  0-15 Units Subcutaneous TID WC  . insulin detemir  15 Units Subcutaneous Daily  . linagliptin  5 mg Oral Daily  . sildenafil  20 mg Oral TID  . sodium chloride  3 mL Intravenous Q12H  . [DISCONTINUED] furosemide  120 mg Intravenous BID   acetaminophen, ALPRAZolam, alum & mag hydroxide-simeth,  dextrose, hydrALAZINE, ondansetron (ZOFRAN) IV, sodium chloride  Assessment/ Plan:  Pulmonary arterial hypertension, PASP 60 mmHg (mean 38) by right heart catheterization 11/1. Recent VQ scan not suggesting chronic PE, and hemodynamics not clearly consistent with pericardial constriction. ANA and HIV negative; plan outpatient sleep study. Echocardiographic bubble study was negative for obvious shunt. He has been started on sildenafil; titrate as outpatient.   Chronic renal failure. Creatinine has worsened with diuresis   HTN responding well to diuretics  Anemia hb 9.8 iron stores low will give iron orally for now due to heart failure   Bones PTH pending PTH 121  Kappa free to lambda free light chain ration is high. This may need to be evaluated by hematology.  Lasix 160 every 6 hours: continue to diurese   LOS: 12 [name] W @TODAY @8 :52 AM

## 2012-08-06 ENCOUNTER — Inpatient Hospital Stay (HOSPITAL_COMMUNITY): Payer: Medicare Other

## 2012-08-06 LAB — GLUCOSE, CAPILLARY
Glucose-Capillary: 93 mg/dL (ref 70–99)
Glucose-Capillary: 113 mg/dL — ABNORMAL HIGH (ref 70–99)

## 2012-08-06 NOTE — Progress Notes (Signed)
[facility] ROUNDING NOTE   Subjective:   Interval History: less dyspneic but still complains of shortness of breath on minimal exertion  Objective:  Vital signs in last 24 hours:  Temp:  [97.9 F (36.6 C)-98.4 F (36.9 C)] 98.4 F (36.9 C) (11/10 0500) Pulse Rate:  [79-89] 84  (11/10 0937) Resp:  [16-18] 18  (11/10 0500) BP: (122-135)/(52-78) 127/57 mmHg (11/10 0937) SpO2:  [94 %-95 %] 95 % (11/10 0500) Weight:  [95.391 kg (210 lb 4.8 oz)] 95.391 kg (210 lb 4.8 oz) (11/10 0500)  Weight change: -1.709 kg (-3 lb 12.3 oz) Filed Weights   08/04/12 0429 08/05/12 0443 08/06/12 0500  Weight: 98.5 kg (217 lb 2.5 oz) 97.1 kg (214 lb 1.1 oz) 95.391 kg (210 lb 4.8 oz)    Intake/Output: I/O last 3 completed shifts: In: 803 [P.O.:800; I.V.:3] Out: 3975 [Urine:3975]   Intake/Output this shift:     CVS- RRR loud murmur ejection systolic RS- CTA ABD- BS present soft non-distended EXT- no edema   Basic Metabolic Panel:  Lab 08/05/12 1610 08/04/12 0530 08/03/12 0515 08/02/12 0450 08/01/12 0440  NA 131* 132* 132* 132* 133*  K 4.3 4.9 4.8 4.5 4.6  CL 94* 97 98 99 99  CO2 25 24 24 23 25   GLUCOSE 124* 122* 134* 163* 173*  BUN 79* 71* 67* 66* 65*  CREATININE 3.73* 3.60* 3.11* 2.95* 2.99*  CALCIUM 9.3 9.2 9.4 -- --  MG -- -- -- -- --  PHOS -- 4.7* -- -- --    Liver Function Tests:  Lab 08/04/12 0530  AST --  ALT --  ALKPHOS --  BILITOT --  PROT --  ALBUMIN 3.3*   No results found for this basename: LIPASE:5,AMYLASE:5 in the last 168 hours No results found for this basename: AMMONIA:3 in the last 168 hours  CBC:  Lab 07/31/12 0530  WBC 4.6  NEUTROABS --  HGB 9.8*  HCT 29.8*  MCV 86.4  PLT 135*    Cardiac Enzymes: No results found for this basename: CKTOTAL:5,CKMB:5,CKMBINDEX:5,TROPONINI:5 in the last 168 hours  BNP: No components found with this basename: POCBNP:5  CBG:  Lab 08/06/12 0621 08/06/12 0619 08/05/12 2056 08/05/12 1638 08/05/12  1114  GLUCAP 93 177* 198* 133* 144*    Microbiology: Results for orders placed in visit on 04/10/12  HELICOBACTER PYLORI SCREEN-BIOPSY     Status: Normal   Collection Time   04/10/12  4:18 PM      Component Value Range Status Comment   UREASE Negative  Negative Final     Coagulation Studies: No results found for this basename: LABPROT:5,INR:5 in the last 72 hours  Urinalysis: No results found for this basename: COLORURINE:2,APPERANCEUR:2,LABSPEC:2,PHURINE:2,GLUCOSEU:2,HGBUR:2,BILIRUBINUR:2,KETONESUR:2,PROTEINUR:2,UROBILINOGEN:2,NITRITE:2,LEUKOCYTESUR:2 in the last 72 hours    Imaging: US Renal  08/04/2012  *RADIOLOGY REPORT*  Clinical Data: Acute on chronic kidney disease  RENAL/URINARY TRACT ULTRASOUND COMPLETE  Comparison:  11/01/2002  Findings:  Right Kidney:  Right kidney is diffusely echogenic.  It is normal in size with no masses, stones or hydronephrosis.  Right kidney measures 10.8 cm.  Left Kidney:  Left kidney is diffusely echogenic.  It is normal in size with no masses, stones or hydronephrosis.  It measures 10.6 cm.  Bladder:  Unremarkable.  Bilateral pleural effusions are incidentally noted.  IMPRESSION: Diffusely echogenic kidneys consistent with medical renal disease. Kidneys remain normal in size.  No hydronephrosis or renal masses.   Original Report Authenticated By: Amie Portland, M.D.      Medications:        .  amLODipine  10 mg Oral Daily  . aspirin EC  81 mg Oral Daily  . atorvastatin  20 mg Oral q1800  . ferrous sulfate  325 mg Oral BID WC  . furosemide  160 mg Intravenous Q6H  . heparin  5,000 Units Subcutaneous Q8H  . hydrALAZINE  75 mg Oral Q8H  . insulin aspart  0-15 Units Subcutaneous TID WC  . insulin detemir  15 Units Subcutaneous Daily  . linagliptin  5 mg Oral Daily  . metoprolol succinate  12.5 mg Oral Daily  . sildenafil  20 mg Oral TID  . sodium chloride  3 mL Intravenous Q12H   acetaminophen, ALPRAZolam, alum & mag hydroxide-simeth,  dextrose, hydrALAZINE, ondansetron (ZOFRAN) IV, sodium chloride  Assessment/ Plan:  Pulmonary arterial hypertension, PASP 60 mmHg (mean 38) by right heart catheterization 11/1. Recent VQ scan not suggesting chronic PE, and hemodynamics not clearly consistent with pericardial constriction. ANA and HIV negative; plan outpatient sleep study. Echocardiographic bubble study was negative for obvious shunt. He has been started on sildenafil; titrate as outpatient.   Chronic renal failure. Creatinine has worsened with diuresis  HTN responding well to diuretics  Anemia hb 9.8 iron stores low will give iron orally for now due to heart failure  Bones PTH pending PTH 121   Kappa free to lambda free light chain ration is high. This may need to be evaluated by hematology.  Lasix 160 every 6 hours: continue to diurese.   Difficult to treat biventricular failure will meed diuresis but at some point will have to transition to oral diuretics. Dialysis may be needed.        LOS: 13 [name] W @TODAY @9 :55 AM

## 2012-08-06 NOTE — Progress Notes (Signed)
Subjective:  The patient states that his breathing is a little better.  He denies chest pain.  He complains of poor appetite. His weight is down 4 pounds since yesterday and he remains on high-dose intravenous Lasix Objective:  Vital Signs in the last 24 hours: Temp:  [97.9 F (36.6 C)-98.4 F (36.9 C)] 98.4 F (36.9 C) (11/10 0500) Pulse Rate:  [79-89] 84  (11/10 0937) Resp:  [16-18] 18  (11/10 0500) BP: (122-135)/(52-69) 127/57 mmHg (11/10 0937) SpO2:  [94 %-95 %] 95 % (11/10 0500) Weight:  [210 lb 4.8 oz (95.391 kg)] 210 lb 4.8 oz (95.391 kg) (11/10 0500)  Intake/Output from previous day: 11/09 0701 - 11/10 0700 In: 363 [P.O.:360; I.V.:3] Out: 2350 [Urine:2350] Intake/Output from this shift:       . amLODipine  10 mg Oral Daily  . aspirin EC  81 mg Oral Daily  . atorvastatin  20 mg Oral q1800  . ferrous sulfate  325 mg Oral BID WC  . furosemide  160 mg Intravenous Q6H  . heparin  5,000 Units Subcutaneous Q8H  . hydrALAZINE  75 mg Oral Q8H  . insulin aspart  0-15 Units Subcutaneous TID WC  . insulin detemir  15 Units Subcutaneous Daily  . linagliptin  5 mg Oral Daily  . metoprolol succinate  12.5 mg Oral Daily  . sildenafil  20 mg Oral TID  . sodium chloride  3 mL Intravenous Q12H      Physical Exam: The patient appears to be in no distress.  Head and neck exam reveals that the pupils are equal and reactive.  The extraocular movements are full.  There is no scleral icterus.  Mouth and pharynx are benign.  No lymphadenopathy.  No carotid bruits.  The jugular venous pressure is normal.  Thyroid is not enlarged or tender.  Chest reveals diminished breath sounds and dullness at both bases worse on the right.  Heart reveals no abnormal lift or heave.  First and second heart sounds are normal.  There is a grade 2/6 systolic ejection murmur at the base.  No gallop rub or click.  The abdomen is soft and nontender.  Bowel sounds are normoactive.  There is no  hepatosplenomegaly or mass.  There are no abdominal bruits.  Extremities reveal mild pretibial edema. Pedal pulses are good.  There is no cyanosis or clubbing.  Neurologic exam is normal strength and no lateralizing weakness.  No sensory deficits.  Integument reveals no rash  Lab Results: No results found for this basename: WBC:2,HGB:2,PLT:2 in the last 72 hours  Basename 08/05/12 0545 08/04/12 0530  NA 131* 132*  K 4.3 4.9  CL 94* 97  CO2 25 24  GLUCOSE 124* 122*  BUN 79* 71*  CREATININE 3.73* 3.60*   No results found for this basename: TROPONINI:2,CK,MB:2 in the last 72 hours Hepatic Function Panel  Basename 08/04/12 0530  PROT --  ALBUMIN 3.3*  AST --  ALT --  ALKPHOS --  BILITOT --  BILIDIR --  IBILI --   No results found for this basename: CHOL in the last 72 hours No results found for this basename: PROTIME in the last 72 hours  Imaging: Imaging results have been reviewed  Cardiac Studies: Telemetry shows normal sinus rhythm and occasional short runs of supraventricular tachycardia. Assessment/Plan:  Patient Active Hospital Problem List: Acute on chronic diastolic CHF (congestive heart failure) (07/26/2012)   Assessment: Clinically slightly improved with 4 pound weight loss since yesterday    Plan:  We will recheck a chest x-ray today.may need thoracentesis if pleural effusion remains large .   Acute on chronic renal insufficiency (07/29/2012)   Assessment: No labs drawn today    Plan: Recheck BMET in a.m.   LOS: 13 days    Cassell Clement 08/06/2012, 11:49 AM

## 2012-08-07 ENCOUNTER — Inpatient Hospital Stay (HOSPITAL_COMMUNITY): Payer: Medicare Other

## 2012-08-07 LAB — KAPPA/LAMBDA LIGHT CHAINS: Kappa, lambda light chain ratio: 23.99 — ABNORMAL HIGH (ref 0.26–1.65)

## 2012-08-07 LAB — GLUCOSE, CAPILLARY
Glucose-Capillary: 124 mg/dL — ABNORMAL HIGH (ref 70–99)
Glucose-Capillary: 133 mg/dL — ABNORMAL HIGH (ref 70–99)

## 2012-08-07 LAB — PROTEIN ELECTROPHORESIS, SERUM
Albumin ELP: 49.8 % — ABNORMAL LOW (ref 55.8–66.1)
Alpha-1-Globulin: 5.7 % — ABNORMAL HIGH (ref 2.9–4.9)
Beta 2: 4.9 % (ref 3.2–6.5)
Beta Globulin: 5.1 % (ref 4.7–7.2)

## 2012-08-07 LAB — BASIC METABOLIC PANEL
BUN: 82 mg/dL — ABNORMAL HIGH (ref 6–23)
Calcium: 9.4 mg/dL (ref 8.4–10.5)
Creatinine, Ser: 4.09 mg/dL — ABNORMAL HIGH (ref 0.50–1.35)
GFR calc non Af Amer: 13 mL/min — ABNORMAL LOW (ref >90)
Glucose, Bld: 127 mg/dL — ABNORMAL HIGH (ref 70–99)
Sodium: 131 mEq/L — ABNORMAL LOW (ref 135–145)

## 2012-08-07 MED ORDER — FUROSEMIDE 10 MG/ML IJ SOLN
160.0000 mg | Freq: Three times a day (TID) | INTRAVENOUS | Status: DC
  Administered 2012-08-07 – 2012-08-08 (×3): 160 mg via INTRAVENOUS
  Filled 2012-08-07 (×5): qty 16

## 2012-08-07 MED ORDER — SODIUM CHLORIDE 0.9 % IV SOLN
1020.0000 mg | Freq: Once | INTRAVENOUS | Status: AC
  Administered 2012-08-07: 1020 mg via INTRAVENOUS
  Filled 2012-08-07: qty 34

## 2012-08-07 NOTE — Progress Notes (Signed)
Pt ambulated 350 feet in hallway independently with rolling walker; pt back to room to chair; call bell w/i reach; will cont. To monitor.

## 2012-08-07 NOTE — Progress Notes (Signed)
Pt states he feels weak and feels like BS is dropping; BS 98; pt given orange juice per pt request; will cont. To monitor.

## 2012-08-07 NOTE — Progress Notes (Signed)
   Primary cardiologist: Dr. Olga Millers  Subjective:   No chest pain. Continues with dyspnea on exertion but improved.   Objective:   Temp:  [98 F (36.7 C)-98.7 F (37.1 C)] 98.7 F (37.1 C) (11/11 0406) Pulse Rate:  [77-84] 77  (11/10 2008) Resp:  [18] 18  (11/11 0406) BP: (125-136)/(57-64) 136/58 mmHg (11/11 0406) SpO2:  [93 %-100 %] 99 % (11/11 0406) Weight:  [210 lb 14.4 oz (95.664 kg)] 210 lb 14.4 oz (95.664 kg) (11/11 0406) Last BM Date: 08/06/12  Filed Weights   08/05/12 0443 08/06/12 0500 08/07/12 0406  Weight: 214 lb 1.1 oz (97.1 kg) 210 lb 4.8 oz (95.391 kg) 210 lb 14.4 oz (95.664 kg)    Intake/Output Summary (Last 24 hours) at 08/07/12 0651 Last data filed at 08/07/12 0300  Gross per 24 hour  Intake    480 ml  Output    600 ml  Net   -120 ml    Exam:  General: No acute distress.  Lungs: Diminished BS bases   Cardiac: Regular rate and rhythm, accentuated S2. 2/6 systolic murmur  Abdomen: Protuberant, bowel sounds present, nontender.  Extremities: no edema  Lab Results:  Basic Metabolic Panel:  Lab 08/07/12 1610 08/05/12 0545 08/04/12 0530  NA 131* 131* 132*  K 3.7 4.3 4.9  CL 91* 94* 97  CO2 26 25 24   GLUCOSE 127* 124* 122*  BUN 82* 79* 71*  CREATININE 4.09* 3.73* 3.60*  CALCIUM 9.4 9.3 9.2  MG -- -- --    Liver Function Tests:  Lab 08/04/12 0530  AST --  ALT --  ALKPHOS --  BILITOT --  PROT --  ALBUMIN 3.3*    Medications:   Scheduled Medications:    . amLODipine  10 mg Oral Daily  . aspirin EC  81 mg Oral Daily  . atorvastatin  20 mg Oral q1800  . ferrous sulfate  325 mg Oral BID WC  . furosemide  160 mg Intravenous Q6H  . heparin  5,000 Units Subcutaneous Q8H  . hydrALAZINE  75 mg Oral Q8H  . insulin aspart  0-15 Units Subcutaneous TID WC  . insulin detemir  15 Units Subcutaneous Daily  . linagliptin  5 mg Oral Daily  . metoprolol succinate  12.5 mg Oral Daily  . sildenafil  20 mg Oral TID  . sodium chloride  3  mL Intravenous Q12H    Infusions:    PRN Medications: acetaminophen, ALPRAZolam, alum & mag hydroxide-simeth, dextrose, hydrALAZINE, ondansetron (ZOFRAN) IV, sodium chloride   Assessment:   1. Pulmonary arterial hypertension, PASP 60 mmHg (mean 38) by right heart catheterization 11/1. Recent VQ scan not suggesting chronic PE, and hemodynamics not clearly consistent with pericardial constriction. ANA and HIV negative. Echocardiographic bubble study was negative for obvious shunt. He has been started on sildenafil; titrate as outpatient. Outpatient sleep study.  2. Known multivessel CAD status post CABG with cardiac catheterization in March of this year noted in the H&P.  LVEF 50-55%.  3. Acute on chronic renal insufficiency   4. Hypertension.  Plan/Discussion:    Patient symptomatically improved but still with DOE. Repeat chest xray today; patient continues on high dose lasix per nephrology but renal function continues to deteriorate. May need dialysis. Continue present BP meds. Lambda free light chains elevated but Kappa and ratio pending; may need heme consult and fat pad biopsy.  Olga Millers, M.D., F.A.C.C. 7:04 AM

## 2012-08-07 NOTE — Progress Notes (Signed)
Patient ID: [name], male   DOB: May 02, 1931, 78 y.o.   MRN: [id number] S:pt reports that he had some "indigestion" last night after dinner.  No CP this am O:BP 127/75  Pulse 78  Temp 97.8 F (36.6 C) (Oral)  Resp 12  Ht 5\' 11"  (1.803 m)  Wt 95.664 kg (210 lb 14.4 oz)  BMI 29.41 kg/m2  SpO2 98%  Intake/Output Summary (Last 24 hours) at 08/07/12 1000 Last data filed at 08/07/12 0759  Gross per 24 hour  Intake    306 ml  Output    600 ml  Net   -294 ml   Intake/Output: I/O last 3 completed shifts: In: 480 [P.O.:480] Out: 1850 [Urine:1850]  Intake/Output this shift:  Total I/O In: 66 [IV Piggyback:66] Out: -  Weight change: 0.272 kg (9.6 oz) Gen:WD WN AAM in NAD CVS:RRR, no m/r/g Resp:bibasilar crackles WUJ:WJXBJY Ext:1 + pretib edema, 2+ presacral edema   Lab 08/07/12 0505 08/05/12 0545 08/04/12 0530 08/03/12 0515 08/02/12 0450 08/01/12 0440  NA 131* 131* 132* 132* 132* 133*  K 3.7 4.3 4.9 4.8 4.5 4.6  CL 91* 94* 97 98 99 99  CO2 26 25 24 24 23 25   GLUCOSE 127* 124* 122* 134* 163* 173*  BUN 82* 79* 71* 67* 66* 65*  CREATININE 4.09* 3.73* 3.60* 3.11* 2.95* 2.99*  ALBUMIN -- -- 3.3* -- -- --  CALCIUM 9.4 9.3 9.2 9.4 9.1 9.3  PHOS -- -- 4.7* -- -- --  AST -- -- -- -- -- --  ALT -- -- -- -- -- --   Liver Function Tests:  Lab 08/04/12 0530  AST --  ALT --  ALKPHOS --  BILITOT --  PROT --  ALBUMIN 3.3*   No results found for this basename: LIPASE:3,AMYLASE:3 in the last 168 hours No results found for this basename: AMMONIA:3 in the last 168 hours CBC: No results found for this basename: WBC:3,NEUTROABS:3,HGB:3,HCT:3,MCV:5,PLT:3 in the last 168 hours Cardiac Enzymes: No results found for this basename: CKTOTAL:5,CKMB:5,CKMBINDEX:5,TROPONINI:5 in the last 168 hours CBG:  Lab 08/07/12 0634 08/06/12 2102 08/06/12 1632 08/06/12 1139 08/06/12 0621  GLUCAP 124* 161* 168* 113* 93    Iron Studies: No results found for this basename:  IRON,TIBC,TRANSFERRIN,FERRITIN in the last 72 hours Studies/Results: Dg Chest 2 View  08/07/2012  *RADIOLOGY REPORT*  Clinical Data: Shortness of breath with pleural effusion.  CHEST - 2 VIEW  Comparison: 08/06/2012  Findings: Stable asymmetric elevation of the right hemidiaphragm. Persistent vascular congestion with probable interstitial pulmonary edema. As before, there is bibasilar atelectasis or infiltrate, right greater than left.  There are small bilateral pleural effusions.  IMPRESSION: Stable.  Vascular congestion with interstitial edema.  Bibasilar atelectasis/infiltrate with small bilateral pleural effusions.   Original Report Authenticated By: Kennith Center, M.D.    Dg Chest 2 View  08/06/2012  *RADIOLOGY REPORT*  Clinical Data: Shortness of breath.  Congestive heart failure. Diabetes.  Hypertension.  CHEST - 2 VIEW  Comparison: 08/02/2012  Findings: Cardiomegaly is again observed.  Prior CABG noted. Continued indistinct right perihilar and basilar airspace opacity along with left retrocardiac airspace opacity.  Blunted costophrenic angles, right greater than left.  IMPRESSION:  1.  Essentially stable appearance of cardiomegaly, presumed edema, and right greater than left pleural effusions.   Original Report Authenticated By: Gaylyn Rong, M.D.       . amLODipine  10 mg Oral Daily  . aspirin EC  81 mg Oral Daily  . atorvastatin  20 mg Oral  [id number]  . ferrous sulfate  325 mg Oral BID WC  . furosemide  160 mg Intravenous Q6H  . heparin  5,000 Units Subcutaneous Q8H  . hydrALAZINE  75 mg Oral Q8H  . insulin aspart  0-15 Units Subcutaneous TID WC  . insulin detemir  15 Units Subcutaneous Daily  . linagliptin  5 mg Oral Daily  . metoprolol succinate  12.5 mg Oral Daily  . sildenafil  20 mg Oral TID  . sodium chloride  3 mL Intravenous Q12H    BMET    Component Value Date/Time   NA 131* 08/07/2012 0505   K 3.7 08/07/2012 0505   CL 91* 08/07/2012 0505   CO2 26 08/07/2012 0505     GLUCOSE 127* 08/07/2012 0505   BUN 82* 08/07/2012 0505   CREATININE 4.09* 08/07/2012 0505   CREATININE 1.89* 12/20/2011 1233   CALCIUM 9.4 08/07/2012 0505   GFRNONAA 13* 08/07/2012 0505   GFRAA 15* 08/07/2012 0505   CBC    Component Value Date/Time   WBC 4.6 07/31/2012 0530   RBC 3.45* 07/31/2012 0530   HGB 9.8* 07/31/2012 0530   HCT 29.8* 07/31/2012 0530   PLT 135* 07/31/2012 0530   MCV 86.4 07/31/2012 0530   MCH 28.4 07/31/2012 0530   MCHC 32.9 07/31/2012 0530   RDW 15.2 07/31/2012 0530   LYMPHSABS 2.3 01/10/2012 1023   MONOABS 0.4 01/10/2012 1023   EOSABS 0.0 01/10/2012 1023   BASOSABS 0.0 01/10/2012 1023     Assessment/Plan:  1. AKI/CKD- worsening Scr with diuresis.  UOP dropped off yesterday, however his weight dropped by over 1kg.  Question if all of his UOP was recorded as he reports "a lot of urine".  Will also check bladder scan to r/o BOO.  Given -5L over the last several days, will decrease lasix to q8 and follow daily weights and strict I's/O's. 2. CHF- as above, weight is dropping daily. 3. CAD- stable 4. HTN- stable 5. Anemia of chronic disease- UPEP/SPEP pending and will check iron stores and likely start epo 6. DM- per primary svc 7. Dysproteinemia- awaiting final SPEP/UPEP. 8. Vascular access- will need vein mapping and possible AVF placement during this hospitalization.  Aleksey Newbern A

## 2012-08-08 ENCOUNTER — Inpatient Hospital Stay (HOSPITAL_COMMUNITY): Payer: Medicare Other

## 2012-08-08 LAB — BASIC METABOLIC PANEL
Calcium: 9.7 mg/dL (ref 8.4–10.5)
GFR calc Af Amer: 14 mL/min — ABNORMAL LOW (ref >90)
GFR calc non Af Amer: 12 mL/min — ABNORMAL LOW (ref >90)
Potassium: 3.6 mEq/L (ref 3.5–5.1)
Sodium: 133 mEq/L — ABNORMAL LOW (ref 135–145)

## 2012-08-08 LAB — GLUCOSE, CAPILLARY: Glucose-Capillary: 111 mg/dL — ABNORMAL HIGH (ref 70–99)

## 2012-08-08 MED ORDER — FUROSEMIDE 10 MG/ML IJ SOLN
120.0000 mg | Freq: Two times a day (BID) | INTRAVENOUS | Status: DC
  Administered 2012-08-08 – 2012-08-11 (×6): 120 mg via INTRAVENOUS
  Filled 2012-08-08 (×7): qty 12

## 2012-08-08 NOTE — Consult Note (Signed)
ONCOLOGY  HOSPITAL CONSULTATION NOTE  [name]                                MR#: [id number]  DOB: Jun 17, 1931                       CSN#: [id number]  Referring MD: Dr. Olga Millers, MD Primary MD: Dr. Debby Bud  Reason for Consult: 1. Rule out  Multiple Myeloma                                       2.  Rule out Amyloid    [name] is a 78 y.o. African American male with multiple medical issues listed below,including anemia of chronic disease a Iron deficiency, as well as chronic stage 3 kidney disease , admitted on 07/23/12 with chest pain and dyspnea, complicated with hypoglycemic episode requiring code stroke involvement on 10/28, with immediate resolution. A CXR demonstrated right pleural effusion and pulmonary edema. VQ scan was negative for PE. 2 D echo  Labs were remarkable for acute on chronic renal failure, with rising creatinine levels up to 4 today.Sodium has been in the low 130s and Calcium levels have been within normal limits, today at 9.4. Urine protein on 11/3 was 100.  Upon admission, further workup studies included SPEP, negative for M spike, and non specific diffuse polyclonal type increase in gamma globulins at 19.8.  Kappa free light chains were  90.70 Lambda free light chains 3.78  and  Kappa, lamda light chain ratio was 23.99. No immunofixation data is available. UPEP pending. 24 hr urine on 11/8 shows Cr 81, and U. Cr 37.8. ANA was negative. HIV non reactive. Platelets ranging between 130s and 140s. Epo levels at 21.6. retics absolute 41. Iron 20, TIBC 279, percent sat was low at 7. Ferritin 147.   Peripheral blood smear ordered for review.Patient denies any history or family history of bone marrow disorders. Never had a bone marrow biopsy in the past. We were requested to see this patient with recommendations.        PMH:  Past Medical History  Diagnosis Date  . CAD (coronary artery disease)     S/P CABG in 2006. stable anatomy by cath 12/21/11 with  2/3 patent bypass grafts - for med rx. Not on BB 2/2 second-degree AVB.  . LV dysfunction     Per Myoview in 2008; Ef 46% with prior inferior and apical infarct, no ischemia  . Cerebrovascular disease   . HLD (hyperlipidemia)   . Diastolic dysfunction   . Mediastinal adenopathy     per CT chest in 2006 with PET scan showing very limited metabolic activity; not felt to have a significant neoplastic potential  . Anemia of chronic disease and Iron deficiency   . Hypertension   . Diabetes mellitus     insulin dependent   . GERD (gastroesophageal reflux disease)   . AV block, Mobitz 1     Noted 11/2011 in hospital, BB stopped  . Colon polyps   . Diverticulosis        Chronic Renal Insufficiency, Stage 3 (baseline Cr 2.0)  Surgeries:  Past Surgical History  Procedure Date  . Appendectomy   . Coronary artery bypass graft 2006    SVG to OM2, SVG to LAD, SVG to  DX; (the LIMA did not have good flow and therefore was not used)  . Cardiac catheterization 12/21/2011    Allergies: No Known Allergies  Medications:   Prior to Admission:  Prescriptions prior to admission  Medication Sig Dispense Refill  . aspirin EC 81 MG EC tablet Take 1 tablet (81 mg total) by mouth daily.      . furosemide (LASIX) 40 MG tablet Take 40 mg by mouth daily.      Marland Kitchen glimepiride (AMARYL) 2 MG tablet Take 1 tablet (2 mg total) by mouth daily before breakfast. Diabetes.  30 tablet  11  . hydrALAZINE (APRESOLINE) 25 MG tablet Take 1 tablet (25 mg total) by mouth every 8 (eight) hours.  90 tablet  6  . insulin detemir (LEVEMIR FLEXPEN) 100 UNIT/ML injection Inject 30 Units into the skin at bedtime.      . isosorbide mononitrate (IMDUR) 30 MG 24 hr tablet Take 1 tablet (30 mg total) by mouth daily.  30 tablet  11  . losartan (COZAAR) 50 MG tablet Take 50 mg by mouth daily.      . metoprolol succinate (TOPROL-XL) 25 MG 24 hr tablet Take 25 mg by mouth daily.      . nitroGLYCERIN (NITROSTAT) 0.4 MG SL tablet Place 0.4  mg under the tongue every 5 (five) minutes as needed. For chest pain      . simvastatin (ZOCOR) 40 MG tablet Take 1 tablet (40 mg total) by mouth daily.  30 tablet  3  . Insulin Pen Needle (NOVOFINE) 30G X 8 MM MISC Use as directed once daily  100 each  3  . Lite Touch Lancets MISC Inject 1 Device into the skin daily.  30 each  11  . ONE TOUCH ULTRA TEST test strip 1 each by Other route as needed.         YNW:[id number], ALPRAZolam, alum & mag hydroxide-simeth, dextrose, hydrALAZINE, ondansetron (ZOFRAN) IV, sodium chloride  ROS: Constitutional: Negative  for weight loss. Negative for fever, chills. Chronic  night sweats "for years".  Eyes: Negative for blurred vision and double vision.  Respiratory: Negative for productive cough. No hemoptysis.No hoarseness.No neck swelling.  Positive for shortness of breath on exertion, improved since admission. No pleuritic chest pain.  Cardiovascular: Had chest pain on admission, now resolved.No palpitations.  GI: Negative for  nausea, vomiting, diarrhea or constipation. No change in bowel caliber. No  Melena or hematochezia. Negative  for  abdominal pain.  GU: Negative for hematuria. No loss of urinary control. He denies decreased  urinary output, but I/O are showing decreased diuresis, per chart note.. Skin: Negative for itching. No rash. No petechia. No easy  Bruising. Musculoskeletal: Denies bone pain.  Neurological: No headaches.No confusion at this time, but had one episode early in the admission secondary to hypoglycemic event, with negative CT head and without recurrence. No motor or sensory deficits. Psych: No depression. No suicidal thoughts.  Family History:    Family History  Problem Relation Age of Onset  . Stomach cancer Sister   . Pancreatic cancer Brother   . Lung cancer Brother   . Colon cancer Neg Hx    Total of 16 siblings, only 3 are alive. The rest of the deceased siblings did not have any hematological or oncological  history, but cardiac.   No history of multiple myeloma or other bone marrow disorders in the family.   Social History: Widow. 7 sons and 5 daughters. 30 grandchildren. Main contact daughter [name],  [phone].  15 great grandchildren. Retired Environmental manager. Lives alone in [place]. Quit in 1955 used of cigarettes. No alcohol or recreational drug use. No code status addressed.   Physical Exam    Filed Vitals:   08/08/12 0851  BP: 128/62  Pulse: 80  Temp:   Resp:      Filed Weights   08/06/12 0500 08/07/12 0406 08/08/12 0343  Weight: 210 lb 4.8 oz (95.391 kg) 210 lb 14.4 oz (95.664 kg) 200 lb 9.9 oz (91 kg)   General: 67 -year-old  in no acute distress A. and O. x3  well-developed, well-nourished.  HEENT: Normocephalic, atraumatic, PERRLA. Sclerae anicteric. Oral cavity without thrush or lesions. NECK:supple. no thyromegaly, no cervical or supraclavicular adenopathy  LUNGS:  No wheezing,  Bilateral rhonchi , no  Rales. Decreased breath sounds at the bases, right greater than left.  No axillary masses. BREASTS: not examined. CARDIOVASCULAR:  Slow regular rate and rhythm , 2-3/6 systolic  murmur ,no  rubs or gallops ABDOMEN: soft nontender, bowel sounds x4. No HSM. No masses palpable.  GU/rectal: deferred. EXTREMITIES: mild clubbing, nails pale and brittle.No cyanosis, 1 + edema  Bilaterally, right greater than left.  No bruising or petechial rash. Skin very dry.  MUSCULOSKELETAL: no spinal tenderness.  NEURO: Non Focal. No Horner's.   Labs:  CBC    Component Value Date/Time   WBC 4.6 07/31/2012 0530   RBC 3.45* 07/31/2012 0530   HGB 9.8* 07/31/2012 0530   HCT 29.8* 07/31/2012 0530   PLT 135* 07/31/2012 0530   MCV 86.4 07/31/2012 0530   MCH 28.4 07/31/2012 0530   MCHC 32.9 07/31/2012 0530   RDW 15.2 07/31/2012 0530   LYMPHSABS 2.3 01/10/2012 1023   MONOABS 0.4 01/10/2012 1023   EOSABS 0.0 01/10/2012 1023   BASOSABS 0.0 01/10/2012 1023    CMP    Lab  08/08/12 0510 08/07/12 0505 08/05/12 0545 08/04/12 0530 08/03/12 0515  NA 133* 131* 131* 132* 132*  K 3.6 3.7 4.3 4.9 4.8  CL 92* 91* 94* 97 98  CO2 29 26 25 24 24   GLUCOSE 92 127* 124* 122* 134*  BUN 85* 82* 79* 71* 67*  CREATININE 4.26* 4.09* 3.73* 3.60* 3.11*  CALCIUM 9.7 9.4 9.3 9.2 9.4  MG -- -- -- -- --  AST -- -- -- -- --  ALT -- -- -- -- --  ALKPHOS -- -- -- -- --  BILITOT -- -- -- -- --     Anemia panel: Iron/TIBC/Ferritin    Component Value Date/Time   IRON 20* 08/03/2012 1529   TIBC 279 08/03/2012 1529   FERRITIN 147 08/03/2012 1529     Imaging Studies:  Dg Chest 2 View  08/07/2012  *RADIOLOGY REPORT*  Clinical Data: Shortness of breath with pleural effusion.  CHEST - 2 VIEW  Comparison: 08/06/2012  Findings: Stable asymmetric elevation of the right hemidiaphragm. Persistent vascular congestion with probable interstitial pulmonary edema. As before, there is bibasilar atelectasis or infiltrate, right greater than left.  There are small bilateral pleural effusions.  IMPRESSION: Stable.  Vascular congestion with interstitial edema.  Bibasilar atelectasis/infiltrate with small bilateral pleural effusions.   Original Report Authenticated By: Kennith Center, M.D.    Dg Chest 2 View  07/25/2012  *RADIOLOGY REPORT*  Clinical Data: Chest pain and short of breath  CHEST - 2 VIEW  Comparison: 12/22/2011  Findings: Prior CABG.  Cardiac enlargement with pulmonary vascular congestion and mild edema.  Small pleural effusions bilaterally with mild bibasilar atelectasis.  IMPRESSION: Congestive heart failure with mild edema and small pleural effusions.   Original Report Authenticated By: Camelia Phenes, M.D.    Ct Head Wo Contrast  07/24/2012  *RADIOLOGY REPORT*  Clinical Data: Code stroke.  Slurred speech, left facial droop.  CT HEAD WITHOUT CONTRAST  Technique:  Contiguous axial images were obtained from the base of the skull through the vertex without contrast.  Comparison: 01/23/2009   Findings: There is atrophy and chronic small vessel disease changes. No acute intracranial abnormality.  Specifically, no hemorrhage, hydrocephalus, mass lesion, acute infarction, or significant intracranial injury.  No acute calvarial abnormality. Visualized paranasal sinuses and mastoids clear.  Orbital soft tissues unremarkable.  IMPRESSION: No acute intracranial abnormality.  Atrophy, chronic microvascular disease.   Original Report Authenticated By: Cyndie Chime, M.D.    US Renal  08/04/2012  *RADIOLOGY REPORT*  Clinical Data: Acute on chronic kidney disease  RENAL/URINARY TRACT ULTRASOUND COMPLETE  Comparison:  11/01/2002  Findings:  Right Kidney:  Right kidney is diffusely echogenic.  It is normal in size with no masses, stones or hydronephrosis.  Right kidney measures 10.8 cm.  Left Kidney:  Left kidney is diffusely echogenic.  It is normal in size with no masses, stones or hydronephrosis.  It measures 10.6 cm.  Bladder:  Unremarkable.  Bilateral pleural effusions are incidentally noted.  IMPRESSION: Diffusely echogenic kidneys consistent with medical renal disease. Kidneys remain normal in size.  No hydronephrosis or renal masses.   Original Report Authenticated By: Amie Portland, M.D.    Nm Pulmonary Perf And Vent  07/26/2012  *RADIOLOGY REPORT*  Clinical Data: Chest pain and shortness of breath for the past week.  NM PULMONARY VENTILATION AND PERFUSION SCAN  Radiopharmaceutical: CURIE MAA TECHNETIUM TO 38M ALBUMIN AGGREGATED and 40 mCi technetium 7m DTPA  Comparison: Chest radiographs obtained yesterday.  Findings: Normal ventilation of both lungs.  Normal perfusion of both lungs.  IMPRESSION: Normal examination.  No evidence of pulmonary embolism.   Original Report Authenticated By: Darrol Angel, M.D.    2 D echo:  - Left ventricle: The cavity size was normal. Wall thickness was increased in a pattern of mild LVH. Systolic function was normal. The estimated ejection fraction was in  the range of 50% to 55%. There is akinesis of the posterior myocardium. Features are consistent with a pseudonormal left ventricular filling pattern, with concomitant abnormal relaxation and increased filling pressure (grade 2 diastolic dysfunction). - Mitral valve: Mild regurgitation. - Left atrium: The atrium was mildly dilated. - Right ventricle: The cavity size was mildly dilated. - Right atrium: The atrium was mildly dilated. - Tricuspid valve: Moderate regurgitation. - Pulmonary arteries: Systolic pressure was severely increased. PA peak pressure: 76mm Hg (S).      A/P: 78 y.o. male admitted with chest pain and shortness of breath in the setting of acute on chronic renal insufficiency and anemia of chronic disease and iron deficiency, found to have  non specific diffuse polyclonal type increase in gamma globulins at 19.8.  Kappa free light chains were  90.70 Lambda free light chains 3.78  and  Kappa, lambda light chain ratio was 23.99. No immunofixation data is available. UPEP pending. Cardiology service has kindly requested hematological evaluation to help out multiple myeloma and/or amyloid. Dr. Cyndie Chime is to see the patient following this consult with recommendations regarding  further workup studies, which may include, if indicated, a bone marrow biopsy, and other labs including  LDH, B2 microglobulins Addendum to this note to  be written. Thank you for the referral.  Marcos Eke 08/08/2012 10:42 AM  Hematology attending: History physical exam accurate as recorded above by the physician assistant. Pertinent negatives on physical exam include the absence of any periorbital purpura, absence of glossitis, and absence of organomegaly. Pertinent negatives on review of systems include absence of any paresthesias, no history of exposure to any toxic chemicals, organic solvents, therapeutic dose radiation, or asbestos. 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 There is no family history of any blood disorder. (He is one of 16 children and has 12 children of his own).  Pertinent lab data include: Chronic normochromic anemia or at least the last 4 years with hemoglobin documented as 11.7 in October of 2009 with hemoglobins currently in the range of 10-11 g and despite worsening renal function. Serum total protein is mid range normal at 7.1 g percent with only a slightly decreased albumin of 3.2 g percent Serum protein electrophoresis shows a polyclonal pattern with no M spike. IFE not done. kappa free light chains in the serum are disproportionately elevated to lambda light chains with an abnormal kappa to lambda ratio. 24 hour urine collection shows only a total of 38 mg of protein. Serum troponin is not elevated(less than 0.3 units reproducible x2) despite marked elevation of BNP.  Pertinent Procedure results: Echocardiogram with estimated EF 50-55% 10/29  Grade 2 diastolic dysfunction; mild LVH, mild dilation of RV Left heart Cath: see chart Right heart cath: see chart: pulmonary artery hypertension   Impression:  Etiology of biventricular heart failure is unclear. He has known ischemic cardiac disease due to hypertension, diabetes, hyperlipidemia and native coronary artery disease and he is status post bypass surgery. What has been unexplained is the increased right ventricular pressure in the absence of any obvious pulmonary disease or pulmonary emboli. I think that the elevated kappa  free light chains are an artifact of his hydration status. I would not have even ordered them in the face of a negative SPEP. There is no significant hypoalbuminemia and no significant proteinuria to suggest that he has light chain amyloidosis or multiple myeloma. His anemia is chronic and unchanged. Amyloidosis is usually but not exclusively lambda. Elevated troponins are a sensitive marker for an  infiltrative cardiomyopathy including amyloidosis and his troponin is undetectable despite no signs of congestive failure (clinical and lab with significant elevation of BNP). There is no evidence for extra cardiac organ infiltration with normal liver functions. He has chronic renal insufficiency which only got worse when he has to receive intensive diuresis to control symptoms of heart failure. At the end of the day, I don't think that he has anything that points to a diagnosis of either amyloidosis or multiple myeloma. 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 Recent data has correlated significant elevation of serum free light chains with increased risk of conversion to multiple myeloma from the usual 1% per year up to 2% per year. At this point, we don't have immunofixation electrophoresis to even document MGUS.   Recommendation: I will check immunofixation electrophoresis of urine and serum but in the context of the discussion above, I think there is a very low likelihood that he has either multiple myeloma or amyloidosis and I do not think it would be productive to put him through either a fat pad biopsy or a bone marrow biopsy. I would consider pulmonary function tests with  DLCO to exclude byssinosis or other obstructive/restrictive lung disease  Thank you for this consultation    a   and a

## 2012-08-08 NOTE — Progress Notes (Addendum)
Patient ID: QUINNTEN CALVIN, male   DOB: [birth year], 78 y.o.   MRN: [id number] S:feels better O:BP 128/62  Pulse 80  Temp 98.4 F (36.9 C) (Oral)  Resp 18  Ht 5\' 11"  (1.803 m)  Wt 91 kg (200 lb 9.9 oz)  BMI 27.98 kg/m2  SpO2 95%  Intake/Output Summary (Last 24 hours) at 08/08/12 0937 Last data filed at 08/07/12 2248  Gross per 24 hour  Intake    503 ml  Output   1675 ml  Net  -1172 ml   Intake/Output: I/O last 3 completed shifts: In: 809 [P.O.:540; I.V.:3; IV Piggyback:266] Out: 2075 [Urine:2075]  Intake/Output this shift:    Weight change: -4.664 kg (-10 lb 4.5 oz) Gen:WD WN AAM in NAD CVS:no rub Resp:decreased BS at bases [id number], +BS, soft, NT Ext: no edema   Lab 08/08/12 0510 08/07/12 0505 08/05/12 0545 08/04/12 0530 08/03/12 0515 08/02/12 0450  NA 133* 131* 131* 132* 132* 132*  K 3.6 3.7 4.3 4.9 4.8 4.5  CL 92* 91* 94* 97 98 99  CO2 29 26 25 24 24 23   GLUCOSE 92 127* 124* 122* 134* 163*  BUN 85* 82* 79* 71* 67* 66*  CREATININE 4.26* 4.09* 3.73* 3.60* 3.11* 2.95*  ALBUMIN -- -- -- 3.3* -- --  CALCIUM 9.7 9.4 9.3 9.2 9.4 9.1  PHOS -- -- -- 4.7* -- --  AST -- -- -- -- -- --  ALT -- -- -- -- -- --   Liver Function Tests:  Lab 08/04/12 0530  AST --  ALT --  ALKPHOS --  BILITOT --  PROT --  ALBUMIN 3.3*   No results found for this basename: LIPASE:3,AMYLASE:3 in the last 168 hours No results found for this basename: AMMONIA:3 in the last 168 hours CBC: No results found for this basename: WBC:3,NEUTROABS:3,HGB:3,HCT:3,MCV:5,PLT:3 in the last 168 hours Cardiac Enzymes: No results found for this basename: CKTOTAL:5,CKMB:5,CKMBINDEX:5,TROPONINI:5 in the last 168 hours CBG:  Lab 08/08/12 0558 08/07/12 2111 08/07/12 1631 08/07/12 1513 08/07/12 1132  GLUCAP 86 152* 133* 98 87    Iron Studies: No results found for this basename: IRON,TIBC,TRANSFERRIN,FERRITIN in the last 72 hours Studies/Results: Dg Chest 2 View  08/07/2012  *RADIOLOGY REPORT*   Clinical Data: Shortness of breath with pleural effusion.  CHEST - 2 VIEW  Comparison: 08/06/2012  Findings: Stable asymmetric elevation of the right hemidiaphragm. Persistent vascular congestion with probable interstitial pulmonary edema. As before, there is bibasilar atelectasis or infiltrate, right greater than left.  There are small bilateral pleural effusions.  IMPRESSION: Stable.  Vascular congestion with interstitial edema.  Bibasilar atelectasis/infiltrate with small bilateral pleural effusions.   Original Report Authenticated By: Kennith Center, M.D.    Dg Chest 2 View  08/06/2012  *RADIOLOGY REPORT*  Clinical Data: Shortness of breath.  Congestive heart failure. Diabetes.  Hypertension.  CHEST - 2 VIEW  Comparison: 08/02/2012  Findings: Cardiomegaly is again observed.  Prior CABG noted. Continued indistinct right perihilar and basilar airspace opacity along with left retrocardiac airspace opacity.  Blunted costophrenic angles, right greater than left.  IMPRESSION:  1.  Essentially stable appearance of cardiomegaly, presumed edema, and right greater than left pleural effusions.   Original Report Authenticated By: Gaylyn Rong, M.D.       . amLODipine  10 mg Oral Daily  . aspirin EC  81 mg Oral Daily  . atorvastatin  20 mg Oral q1800  . ferrous sulfate  325 mg Oral BID WC  . [COMPLETED] ferumoxytol  1,020 mg  Intravenous Once  . furosemide  160 mg Intravenous Q8H  . heparin  5,000 Units Subcutaneous Q8H  . hydrALAZINE  75 mg Oral Q8H  . insulin aspart  0-15 Units Subcutaneous TID WC  . insulin detemir  15 Units Subcutaneous Daily  . linagliptin  5 mg Oral Daily  . metoprolol succinate  12.5 mg Oral Daily  . sildenafil  20 mg Oral TID  . sodium chloride  3 mL Intravenous Q12H  . [DISCONTINUED] furosemide  160 mg Intravenous Q6H    BMET    Component Value Date/Time   NA 133* 08/08/2012 0510   K 3.6 08/08/2012 0510   CL 92* 08/08/2012 0510   CO2 29 08/08/2012 0510   GLUCOSE 92  08/08/2012 0510   BUN 85* 08/08/2012 0510   CREATININE 4.26* 08/08/2012 0510   CREATININE 1.89* 12/20/2011 1233   CALCIUM 9.7 08/08/2012 0510   GFRNONAA 12* 08/08/2012 0510   GFRAA 14* 08/08/2012 0510   CBC    Component Value Date/Time   WBC 4.6 07/31/2012 0530   RBC 3.45* 07/31/2012 0530   HGB 9.8* 07/31/2012 0530   HCT 29.8* 07/31/2012 0530   PLT 135* 07/31/2012 0530   MCV 86.4 07/31/2012 0530   MCH 28.4 07/31/2012 0530   MCHC 32.9 07/31/2012 0530   RDW 15.2 07/31/2012 0530   LYMPHSABS 2.3 01/10/2012 1023   MONOABS 0.4 01/10/2012 1023   EOSABS 0.0 01/10/2012 1023   BASOSABS 0.0 01/10/2012 1023    Assessment/Plan:  1. AKI/CKD- worsening Scr with diuresis. UOP dropped off yesterday, however his weight dropped by over 1kg. Question if all of his UOP was recorded as he reports "a lot of urine". Will also check bladder scan to r/o BOO. Given -7L over the last several days, will decrease lasix to q12 and follow daily weights and strict I's/O's. 2. CHF- as above, weight is dropping daily.  Down over 7kg.  Will decreased diuretics to bid and follow.  Appears euvolemic at this point. 3. CAD- stable 4. HTN- stable 5. Pulmonary HTN- unclear etiology.  Agree with sleep study 6. Anemia of chronic disease- +UPEP/SPEP with large kappa/lambda light chains.  In setting of ARF, anemia, and unexplained pulmonary HTN, agree with Heme/Onc evaluation.   7. Iron deficiency- given dose of feraheme 8. DM- per primary svc 9. Dysproteinemia- +kappa/lamda ratio.  Agree with heme onc eval. 10. Vascular access- will need vein mapping and possible AVF placement during this hospitalization. 11. Disposition- decrease lasix and hopefully will see creatinine begin to decline.  Whitlee Sluder A

## 2012-08-08 NOTE — Progress Notes (Signed)
   Primary cardiologist: Dr. Olga Millers  Subjective:   No chest pain. Dyspnea continues to improve.   Objective:   Temp:  [97.9 F (36.6 C)-98.4 F (36.9 C)] 98.4 F (36.9 C) (11/12 0343) Pulse Rate:  [68-92] 80  (11/12 0851) Resp:  [16-18] 18  (11/12 0343) BP: (125-130)/(59-62) 128/62 mmHg (11/12 0851) SpO2:  [94 %-96 %] 95 % (11/12 0343) Weight:  [200 lb 9.9 oz (91 kg)] 200 lb 9.9 oz (91 kg) (11/12 0343) Last BM Date: 08/08/12  Filed Weights   08/06/12 0500 08/07/12 0406 08/08/12 0343  Weight: 210 lb 4.8 oz (95.391 kg) 210 lb 14.4 oz (95.664 kg) 200 lb 9.9 oz (91 kg)    Intake/Output Summary (Last 24 hours) at 08/08/12 0937 Last data filed at 08/07/12 2248  Gross per 24 hour  Intake    503 ml  Output   1675 ml  Net  -1172 ml    Exam:  General: No acute distress.  Lungs: Diminished BS bases   Cardiac: Regular rate and rhythm, accentuated S2. 2/6 systolic murmur  Abdomen: Protuberant, bowel sounds present, nontender.  Extremities: no edema  Lab Results:  Basic Metabolic Panel:  Lab 08/08/12 6962 08/07/12 0505 08/05/12 0545  NA 133* 131* 131*  K 3.6 3.7 4.3  CL 92* 91* 94*  CO2 29 26 25   GLUCOSE 92 127* 124*  BUN 85* 82* 79*  CREATININE 4.26* 4.09* 3.73*  CALCIUM 9.7 9.4 9.3  MG -- -- --    Liver Function Tests:  Lab 08/04/12 0530  AST --  ALT --  ALKPHOS --  BILITOT --  PROT --  ALBUMIN 3.3*    Medications:   Scheduled Medications:    . amLODipine  10 mg Oral Daily  . aspirin EC  81 mg Oral Daily  . atorvastatin  20 mg Oral q1800  . ferrous sulfate  325 mg Oral BID WC  . [COMPLETED] ferumoxytol  1,020 mg Intravenous Once  . furosemide  160 mg Intravenous Q8H  . heparin  5,000 Units Subcutaneous Q8H  . hydrALAZINE  75 mg Oral Q8H  . insulin aspart  0-15 Units Subcutaneous TID WC  . insulin detemir  15 Units Subcutaneous Daily  . linagliptin  5 mg Oral Daily  . metoprolol succinate  12.5 mg Oral Daily  . sildenafil  20 mg Oral  TID  . sodium chloride  3 mL Intravenous Q12H  . [DISCONTINUED] furosemide  160 mg Intravenous Q6H    Infusions:    PRN Medications: acetaminophen, ALPRAZolam, alum & mag hydroxide-simeth, dextrose, hydrALAZINE, ondansetron (ZOFRAN) IV, sodium chloride   Assessment:   1. Pulmonary arterial hypertension, PASP 60 mmHg (mean 38) by right heart catheterization 11/1. Recent VQ scan not suggesting chronic PE, and hemodynamics not clearly consistent with pericardial constriction. ANA and HIV negative. Echocardiographic bubble study was negative for obvious shunt. He has been started on sildenafil; titrate as outpatient. Outpatient sleep study.  2. Known multivessel CAD status post CABG with cardiac catheterization in March of this year noted in the H&P.  LVEF 50-55%.  3. Acute on chronic renal insufficiency   4. Hypertension.  Plan/Discussion:    Patient symptomatically improved. Lasix being adjusted by nephrology. May need dialysis. Continue present BP meds. Lambda free light chains, kappa light chains and ratio elevated; will need to exclude amyloid and multiple myeloma; may need bone marrow and fat pad biopsy.  Olga Millers, M.D., F.A.C.C. 9:37 AM

## 2012-08-09 LAB — CBC
HCT: 26.1 % — ABNORMAL LOW (ref 39.0–52.0)
MCV: 83.1 fL (ref 78.0–100.0)
RBC: 3.14 MIL/uL — ABNORMAL LOW (ref 4.22–5.81)
RDW: 14.9 % (ref 11.5–15.5)
WBC: 4.8 10*3/uL (ref 4.0–10.5)

## 2012-08-09 LAB — RENAL FUNCTION PANEL
BUN: 85 mg/dL — ABNORMAL HIGH (ref 6–23)
Glucose, Bld: 83 mg/dL (ref 70–99)
Phosphorus: 4.6 mg/dL (ref 2.3–4.6)
Potassium: 3.5 mEq/L (ref 3.5–5.1)

## 2012-08-09 LAB — IGG, IGA, IGM: IgG (Immunoglobin G), Serum: 1660 mg/dL — ABNORMAL HIGH (ref 650–1600)

## 2012-08-09 LAB — GLUCOSE, CAPILLARY
Glucose-Capillary: 88 mg/dL (ref 70–99)
Glucose-Capillary: 95 mg/dL (ref 70–99)
Glucose-Capillary: 138 mg/dL — ABNORMAL HIGH (ref 70–99)

## 2012-08-09 NOTE — Progress Notes (Signed)
Patient ID: JIBRIL MCMINN, male   DOB: 25-Mar-1931, 78 y.o.   MRN: [id number] S:c/o not sleeping well last night due to being hot in the room O:BP 112/55  Pulse 61  Temp 98.3 F (36.8 C) (Oral)  Resp 18  Ht 5\' 11"  (1.803 m)  Wt 92.4 kg (203 lb 11.3 oz)  BMI 28.41 kg/m2  SpO2 92%  Intake/Output Summary (Last 24 hours) at 08/09/12 1311 Last data filed at 08/09/12 1100  Gross per 24 hour  Intake   2603 ml  Output   1400 ml  Net   1203 ml   Intake/Output: I/O last 3 completed shifts: In: 726 [P.O.:720; I.V.:6] Out: 1326 [Urine:1325; Stool:1]  Intake/Output this shift:  Total I/O In: 2360 [P.O.:2360] Out: 1050 [Urine:1050] Weight change: 1.4 kg (3 lb 1.4 oz) Gen:WD WN AAM in NAD CVS:no rub, II/VI SEM Resp:CTA NWG:[id number], NT Ext:no edema   Lab 08/09/12 0645 08/08/12 0510 08/07/12 0505 08/05/12 0545 08/04/12 0530 08/03/12 0515  NA 135 133* 131* 131* 132* 132*  K 3.5 3.6 3.7 4.3 4.9 4.8  CL 95* 92* 91* 94* 97 98  CO2 28 29 26 25 24 24   GLUCOSE 83 92 127* 124* 122* 134*  BUN 85* 85* 82* 79* 71* 67*  CREATININE 4.58* 4.26* 4.09* 3.73* 3.60* 3.11*  ALBUMIN 3.4* -- -- -- 3.3* --  CALCIUM 9.7 9.7 9.4 9.3 9.2 9.4  PHOS 4.6 -- -- -- 4.7* --  AST -- -- -- -- -- --  ALT -- -- -- -- -- --   Liver Function Tests:  Lab 08/09/12 0645 08/04/12 0530  AST -- --  ALT -- --  ALKPHOS -- --  BILITOT -- --  PROT -- --  ALBUMIN 3.4* 3.3*   No results found for this basename: LIPASE:3,AMYLASE:3 in the last 168 hours No results found for this basename: AMMONIA:3 in the last 168 hours CBC:  Lab 08/09/12 0645  WBC 4.8  NEUTROABS --  HGB 8.9*  HCT 26.1*  MCV 83.1  PLT 174   Cardiac Enzymes: No results found for this basename: CKTOTAL:5,CKMB:5,CKMBINDEX:5,TROPONINI:5 in the last 168 hours CBG:  Lab 08/09/12 1135 08/09/12 0604 08/08/12 2051 08/08/12 1639 08/08/12 1130  GLUCAP 95 88 210* 111* 136*    Iron Studies: No results found for this basename:  IRON,TIBC,TRANSFERRIN,FERRITIN in the last 72 hours Studies/Results: Dg Bone Survey Met  08/08/2012  *RADIOLOGY REPORT*  Clinical Data: Positive capital light chain disease.  Rule out lytic multiple myeloma lesions.  METASTATIC BONE SURVEY  Comparison: Chest radiograph, 08/07/2012  Findings: Imaging included and lateral images fall, imaging of the spine, imaging of the chest and pelvis and imaging of the humeri and femurs.  There are no lytic lesions to suggest multiple myeloma.  The chest radiograph shows hazy opacity at the right base with less opacity at the medial left lung base unchanged from the previous day's study.  There are degenerative changes throughout the visualized spine. There is a mild compression fracture of L1 that is likely old. There are degenerative changes with chondrocalcinosis of both knees.  There are degenerative changes of both AC joints and of the left glenohumeral joint.  The bones are diffusely demineralized.  IMPRESSION: No osteolytic lesions to suggest multiple myeloma.  No evidence of neoplastic disease to bone.   Original Report Authenticated By: Amie Portland, M.D.       . amLODipine  10 mg Oral Daily  . aspirin EC  81 mg Oral Daily  . atorvastatin  20 mg Oral q1800  . ferrous sulfate  325 mg Oral BID WC  . furosemide  120 mg Intravenous BID  . heparin  5,000 Units Subcutaneous Q8H  . hydrALAZINE  75 mg Oral Q8H  . insulin aspart  0-15 Units Subcutaneous TID WC  . insulin detemir  15 Units Subcutaneous Daily  . linagliptin  5 mg Oral Daily  . metoprolol succinate  12.5 mg Oral Daily  . sildenafil  20 mg Oral TID  . sodium chloride  3 mL Intravenous Q12H    BMET    Component Value Date/Time   NA 135 08/09/2012 0645   K 3.5 08/09/2012 0645   CL 95* 08/09/2012 0645   CO2 28 08/09/2012 0645   GLUCOSE 83 08/09/2012 0645   BUN 85* 08/09/2012 0645   CREATININE 4.58* 08/09/2012 0645   CREATININE 1.89* 12/20/2011 1233   CALCIUM 9.7 08/09/2012 0645    GFRNONAA 11* 08/09/2012 0645   GFRAA 13* 08/09/2012 0645   CBC    Component Value Date/Time   WBC 4.8 08/09/2012 0645   RBC 3.14* 08/09/2012 0645   HGB 8.9* 08/09/2012 0645   HCT 26.1* 08/09/2012 0645   PLT 174 08/09/2012 0645   MCV 83.1 08/09/2012 0645   MCH 28.3 08/09/2012 0645   MCHC 34.1 08/09/2012 0645   RDW 14.9 08/09/2012 0645   LYMPHSABS 2.3 01/10/2012 1023   MONOABS 0.4 01/10/2012 1023   EOSABS 0.0 01/10/2012 1023   BASOSABS 0.0 01/10/2012 1023    Assessment/Plan:  1. AKI/CKD- worsening Scr with diuresis. Diuresing well over the last several days, will change lasix to po q12 and follow daily weights and strict I's/O's. 2. CHF- as above, weight is dropping daily. Down over 7kg. Will decreased diuretics to bid and follow. Appears euvolemic at this point. 3. CAD- stable 4. HTN- stable 5. Pulmonary HTN- unclear etiology. Agree with sleep study 6. Anemia of chronic disease- follow iron stores and start esa 7. Iron deficiency- given dose of feraheme 8. DM- per primary svc 9. Dysproteinemia- +kappa/lamda ratio. Await immunofixation. 10. Vascular access- will need vein mapping and possible AVF placement during this hospitalization. 11. Disposition- decrease lasix and hopefully will see creatinine begin to decline. 12.  Kyara Boxer A

## 2012-08-09 NOTE — Progress Notes (Signed)
   Primary cardiologist: Dr. Olga Millers  Subjective:   No chest pain. Denies dyspnea   Objective:   Temp:  [97.4 F (36.3 C)-98.3 F (36.8 C)] 98.3 F (36.8 C) (11/13 0433) Pulse Rate:  [61-80] 61  (11/13 0433) Resp:  [18] 18  (11/13 0433) BP: (120-133)/(60-65) 120/63 mmHg (11/13 0433) SpO2:  [92 %-94 %] 92 % (11/13 0433) Weight:  [203 lb 11.3 oz (92.4 kg)] 203 lb 11.3 oz (92.4 kg) (11/13 0433) Last BM Date: 08/08/12  Filed Weights   08/07/12 0406 08/08/12 0343 08/09/12 0433  Weight: 210 lb 14.4 oz (95.664 kg) 200 lb 9.9 oz (91 kg) 203 lb 11.3 oz (92.4 kg)    Intake/Output Summary (Last 24 hours) at 08/09/12 0654 Last data filed at 08/08/12 2204  Gross per 24 hour  Intake    723 ml  Output    951 ml  Net   -228 ml    Exam:  General: No acute distress.  Lungs: Diminished BS bases (improved)  Cardiac: Regular rate and rhythm, accentuated S2. 2/6 systolic murmur  Abdomen: Protuberant, bowel sounds present, nontender.  Extremities: no edema  Lab Results:  Basic Metabolic Panel:  Lab 08/08/12 1610 08/07/12 0505 08/05/12 0545  NA 133* 131* 131*  K 3.6 3.7 4.3  CL 92* 91* 94*  CO2 29 26 25   GLUCOSE 92 127* 124*  BUN 85* 82* 79*  CREATININE 4.26* 4.09* 3.73*  CALCIUM 9.7 9.4 9.3  MG -- -- --    Liver Function Tests:  Lab 08/04/12 0530  AST --  ALT --  ALKPHOS --  BILITOT --  PROT --  ALBUMIN 3.3*    Medications:   Scheduled Medications:    . amLODipine  10 mg Oral Daily  . aspirin EC  81 mg Oral Daily  . atorvastatin  20 mg Oral q1800  . ferrous sulfate  325 mg Oral BID WC  . furosemide  120 mg Intravenous BID  . heparin  5,000 Units Subcutaneous Q8H  . hydrALAZINE  75 mg Oral Q8H  . insulin aspart  0-15 Units Subcutaneous TID WC  . insulin detemir  15 Units Subcutaneous Daily  . linagliptin  5 mg Oral Daily  . metoprolol succinate  12.5 mg Oral Daily  . sildenafil  20 mg Oral TID  . sodium chloride  3 mL Intravenous Q12H  .  [DISCONTINUED] furosemide  160 mg Intravenous Q8H    Infusions:    PRN Medications: acetaminophen, ALPRAZolam, alum & mag hydroxide-simeth, dextrose, hydrALAZINE, ondansetron (ZOFRAN) IV, sodium chloride   Assessment:   1. Pulmonary arterial hypertension, PASP 60 mmHg (mean 38) by right heart catheterization 11/1. Recent VQ scan not suggesting chronic PE, and hemodynamics not clearly consistent with pericardial constriction. ANA and HIV negative. Echocardiographic bubble study was negative for obvious shunt. He has been started on sildenafil; titrate as outpatient. Outpatient sleep study.  2. Known multivessel CAD status post CABG with cardiac catheterization in March of this year noted in the H&P.  LVEF 50-55%.  3. Acute on chronic renal insufficiency   4. Hypertension.  Plan/Discussion:    Patient symptomatically improved. Lasix being adjusted by nephrology. May need dialysis. Continue present BP meds. 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 Await PFTs. Ambulate today. Probable DC in AM if ok with nephrology. Will need close fu of renal function following DC. May need to be followed in pulmonary hypertension clinic.  Olga Millers, M.D., F.A.C.C. 6:54 AM

## 2012-08-10 LAB — RENAL FUNCTION PANEL
Albumin: 3.3 g/dL — ABNORMAL LOW (ref 3.5–5.2)
GFR calc Af Amer: 13 mL/min — ABNORMAL LOW (ref >90)
GFR calc non Af Amer: 11 mL/min — ABNORMAL LOW (ref >90)
Phosphorus: 4.9 mg/dL — ABNORMAL HIGH (ref 2.3–4.6)
Potassium: 3.5 mEq/L (ref 3.5–5.1)
Sodium: 134 mEq/L — ABNORMAL LOW (ref 135–145)

## 2012-08-10 LAB — GLUCOSE, CAPILLARY: Glucose-Capillary: 108 mg/dL — ABNORMAL HIGH (ref 70–99)

## 2012-08-10 LAB — IMMUNOFIXATION ELECTROPHORESIS: IgG (Immunoglobin G), Serum: 1550 mg/dL (ref 650–1600)

## 2012-08-10 NOTE — Progress Notes (Signed)
Patient ID: [name], male   DOB: Aug 09, 1931, 78 y.o.   MRN: [id number] S:no new complaints O:BP 130/68  Pulse 78  Temp 97.9 F (36.6 C) (Oral)  Resp 18  Ht 5\' 11"  (1.803 m)  Wt 91 kg (200 lb 9.9 oz)  BMI 27.98 kg/m2  SpO2 92%  Intake/Output Summary (Last 24 hours) at 08/10/12 1330 Last data filed at 08/09/12 2137  Gross per 24 hour  Intake    243 ml  Output    400 ml  Net   -157 ml   Intake/Output: I/O last 3 completed shifts: In: 626 [P.O.:620; I.V.:6] Out: 1800 [Urine:1800]  Intake/Output this shift:    Weight change: -1.4 kg (-3 lb 1.4 oz) Gen:WD WN AAM in NAD CVS:no rub Resp:CTA WUJ:[id number] Ext:no edema   Lab 08/10/12 0528 08/09/12 0645 08/08/12 0510 08/07/12 0505 08/05/12 0545 08/04/12 0530  NA 134* 135 133* 131* 131* 132*  K 3.5 3.5 3.6 3.7 4.3 4.9  CL 93* 95* 92* 91* 94* 97  CO2 28 28 29 26 25 24   GLUCOSE 137* 83 92 127* 124* 122*  BUN 85* 85* 85* 82* 79* 71*  CREATININE 4.52* 4.58* 4.26* 4.09* 3.73* 3.60*  ALBUMIN 3.3* 3.4* -- -- -- 3.3*  CALCIUM 9.7 9.7 9.7 9.4 9.3 9.2  PHOS 4.9* 4.6 -- -- -- 4.7*  AST -- -- -- -- -- --  ALT -- -- -- -- -- --   Liver Function Tests:  Lab 08/10/12 0528 08/09/12 0645 08/04/12 0530  AST -- -- --  ALT -- -- --  ALKPHOS -- -- --  BILITOT -- -- --  PROT -- -- --  ALBUMIN 3.3* 3.4* 3.3*   No results found for this basename: LIPASE:3,AMYLASE:3 in the last 168 hours No results found for this basename: AMMONIA:3 in the last 168 hours CBC:  Lab 08/09/12 0645  WBC 4.8  NEUTROABS --  HGB 8.9*  HCT 26.1*  MCV 83.1  PLT 174   Cardiac Enzymes: No results found for this basename: CKTOTAL:5,CKMB:5,CKMBINDEX:5,TROPONINI:5 in the last 168 hours CBG:  Lab 08/10/12 1121 08/10/12 0602 08/09/12 2041 08/09/12 1704 08/09/12 1135  GLUCAP 121* 129* 200* 138* 95    Iron Studies: No results found for this basename: IRON,TIBC,TRANSFERRIN,FERRITIN in the last 72 hours Studies/Results: No results found.    Marland Kitchen amLODipine   10 mg Oral Daily  . aspirin EC  81 mg Oral Daily  . atorvastatin  20 mg Oral q1800  . ferrous sulfate  325 mg Oral BID WC  . furosemide  120 mg Intravenous BID  . heparin  5,000 Units Subcutaneous Q8H  . hydrALAZINE  75 mg Oral Q8H  . insulin aspart  0-15 Units Subcutaneous TID WC  . insulin detemir  15 Units Subcutaneous Daily  . linagliptin  5 mg Oral Daily  . metoprolol succinate  12.5 mg Oral Daily  . sildenafil  20 mg Oral TID  . sodium chloride  3 mL Intravenous Q12H    BMET    Component Value Date/Time   NA 134* 08/10/2012 0528   K 3.5 08/10/2012 0528   CL 93* 08/10/2012 0528   CO2 28 08/10/2012 0528   GLUCOSE 137* 08/10/2012 0528   BUN 85* 08/10/2012 0528   CREATININE 4.52* 08/10/2012 0528   CREATININE 1.89* 12/20/2011 1233   CALCIUM 9.7 08/10/2012 0528   GFRNONAA 11* 08/10/2012 0528   GFRAA 13* 08/10/2012 0528   CBC    Component Value Date/Time   WBC 4.8  08/09/2012 0645   RBC 3.14* 08/09/2012 0645   HGB 8.9* 08/09/2012 0645   HCT 26.1* 08/09/2012 0645   PLT 174 08/09/2012 0645   MCV 83.1 08/09/2012 0645   MCH 28.3 08/09/2012 0645   MCHC 34.1 08/09/2012 0645   RDW 14.9 08/09/2012 0645   LYMPHSABS 2.3 01/10/2012 1023   MONOABS 0.4 01/10/2012 1023   EOSABS 0.0 01/10/2012 1023   BASOSABS 0.0 01/10/2012 1023    Assessment/Plan:  1. AKI/CKD- Diuresing well over the last several days, and creatinine has finally reached a plateau.  Have changed lasix to po q12 and follow daily weights and strict I's/O's. 2. CHF- as above, weight is dropping daily. Down over 7kg. Will decreased diuretics to bid and follow. Appears euvolemic at this point. 3. CAD- stable 4. HTN- stable 5. Pulmonary HTN- unclear etiology. Agree with sleep study 6. Anemia of chronic disease- follow iron stores and start esa 7. Iron deficiency- given dose of feraheme 8. DM- per primary svc 9. Dysproteinemia- +kappa/lamda ratio. Await immunofixation. 10. Vascular access- will need vein mapping and  possible AVF placement during this hospitalization. 11. Disposition- decrease lasix and hopefully will see creatinine begin to decline.  Will arrange outpt followup with Dr. Hyman Hopes from our office. 12.   Jordon Kristiansen A

## 2012-08-10 NOTE — Progress Notes (Signed)
Primary cardiologist: Dr. Olga Millers  Subjective:   No chest pain. Mild dyspnea   Objective:   Temp:  [97.6 F (36.4 C)-98.8 F (37.1 C)] 97.9 F (36.6 C) (11/14 0314) Pulse Rate:  [69-76] 76  (11/14 0314) Resp:  [18] 18  (11/14 0314) BP: (112-137)/(55-67) 130/57 mmHg (11/14 0314) SpO2:  [92 %-98 %] 92 % (11/14 0314) Weight:  [200 lb 9.9 oz (91 kg)] 200 lb 9.9 oz (91 kg) (11/14 0314) Last BM Date: 08/08/12  Filed Weights   08/08/12 0343 08/09/12 0433 08/10/12 0314  Weight: 200 lb 9.9 oz (91 kg) 203 lb 11.3 oz (92.4 kg) 200 lb 9.9 oz (91 kg)    Intake/Output Summary (Last 24 hours) at 08/10/12 0702 Last data filed at 08/09/12 2137  Gross per 24 hour  Intake    623 ml  Output   1450 ml  Net   -827 ml    Exam:  General: No acute distress.  Lungs: Diminished BS bases (improved)  Cardiac: Regular rate and rhythm, accentuated S2. 2/6 systolic murmur  Abdomen: Protuberant, bowel sounds present, nontender.  Extremities: no edema  Lab Results:  Basic Metabolic Panel:  Lab 08/10/12 1610 08/09/12 0645 08/08/12 0510  NA 134* 135 133*  K 3.5 3.5 3.6  CL 93* 95* 92*  CO2 28 28 29   GLUCOSE 137* 83 92  BUN 85* 85* 85*  CREATININE 4.52* 4.58* 4.26*  CALCIUM 9.7 9.7 9.7  MG -- -- --    Liver Function Tests:  Lab 08/10/12 0528 08/09/12 0645 08/04/12 0530  AST -- -- --  ALT -- -- --  ALKPHOS -- -- --  BILITOT -- -- --  PROT -- -- --  ALBUMIN 3.3* 3.4* 3.3*    Medications:   Scheduled Medications:    . amLODipine  10 mg Oral Daily  . aspirin EC  81 mg Oral Daily  . atorvastatin  20 mg Oral q1800  . ferrous sulfate  325 mg Oral BID WC  . furosemide  120 mg Intravenous BID  . heparin  5,000 Units Subcutaneous Q8H  . hydrALAZINE  75 mg Oral Q8H  . insulin aspart  0-15 Units Subcutaneous TID WC  . insulin detemir  15 Units Subcutaneous Daily  . linagliptin  5 mg Oral Daily  . metoprolol succinate  12.5 mg Oral Daily  . sildenafil  20 mg Oral TID  .  sodium chloride  3 mL Intravenous Q12H    Infusions:    PRN Medications: acetaminophen, ALPRAZolam, alum & mag hydroxide-simeth, dextrose, hydrALAZINE, ondansetron (ZOFRAN) IV, sodium chloride   Assessment:   1. Pulmonary arterial hypertension, PASP 60 mmHg (mean 38) by right heart catheterization 11/1. Recent VQ scan not suggesting chronic PE, and hemodynamics not clearly consistent with pericardial constriction. ANA and HIV negative. Echocardiographic bubble study was negative for obvious shunt. He has been started on sildenafil; titrate as outpatient. PFTs pending. Outpatient sleep study.  2. Known multivessel CAD status post CABG with cardiac catheterization in March of this year noted in the H&P.  LVEF 50-55%.  3. Acute on chronic renal insufficiency   4. Hypertension.  Plan/Discussion:    Patient symptomatically improved. Lasix being adjusted by nephrology. May need dialysis. Continue present BP meds. 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 Await PFTs. Ambulate today. Probable DC in AM if ok with nephrology. Will need close fu of renal function following DC. Will arrange fu in pulmonary hypertension clinic.  Olga Millers, M.D., F.A.C.C. 7:02 AM

## 2012-08-11 ENCOUNTER — Inpatient Hospital Stay (HOSPITAL_COMMUNITY): Payer: Medicare Other

## 2012-08-11 ENCOUNTER — Encounter (HOSPITAL_COMMUNITY)
Admission: AD | Disposition: A | Discharge status: Home / Self Care | Payer: Self-pay | Source: Ambulatory Visit | Attending: Cardiology

## 2012-08-11 DIAGNOSIS — I509 Heart failure, unspecified: Secondary | ICD-10-CM

## 2012-08-11 HISTORY — PX: RIGHT HEART CATHETERIZATION: SHX5447

## 2012-08-11 LAB — GLUCOSE, CAPILLARY
Glucose-Capillary: 156 mg/dL — ABNORMAL HIGH (ref 70–99)
Glucose-Capillary: 125 mg/dL — ABNORMAL HIGH (ref 70–99)

## 2012-08-11 LAB — RENAL FUNCTION PANEL
CO2: 27 mEq/L (ref 19–32)
GFR calc Af Amer: 13 mL/min — ABNORMAL LOW (ref >90)
GFR calc non Af Amer: 11 mL/min — ABNORMAL LOW (ref >90)
Glucose, Bld: 176 mg/dL — ABNORMAL HIGH (ref 70–99)
Potassium: 3.3 mEq/L — ABNORMAL LOW (ref 3.5–5.1)
Sodium: 128 mEq/L — ABNORMAL LOW (ref 135–145)

## 2012-08-11 LAB — POCT I-STAT 3, ART BLOOD GAS (G3+)
Acid-Base Excess: 4 mmol/L — ABNORMAL HIGH (ref 0.0–2.0)
O2 Saturation: 98 %

## 2012-08-11 LAB — PULMONARY FUNCTION TEST

## 2012-08-11 LAB — POCT I-STAT 3, VENOUS BLOOD GAS (G3P V)
pCO2, Ven: 44.7 mmHg — ABNORMAL LOW (ref 45.0–50.0)
pH, Ven: 7.415 — ABNORMAL HIGH (ref 7.250–7.300)

## 2012-08-11 SURGERY — RIGHT HEART CATH
Anesthesia: LOCAL

## 2012-08-11 MED ORDER — SILDENAFIL CITRATE 20 MG PO TABS
40.0000 mg | ORAL_TABLET | Freq: Three times a day (TID) | ORAL | Status: DC
  Administered 2012-08-12 – 2012-08-16 (×16): 40 mg via ORAL
  Filled 2012-08-11 (×19): qty 2

## 2012-08-11 MED ORDER — SODIUM CHLORIDE 0.9 % IV SOLN: 250.0000 mL | INTRAVENOUS | Status: DC | PRN

## 2012-08-11 MED ORDER — MIDAZOLAM HCL 2 MG/2ML IJ SOLN
INTRAMUSCULAR | Status: AC
  Filled 2012-08-11: qty 2

## 2012-08-11 MED ORDER — SODIUM CHLORIDE 0.9 % IJ SOLN: 3.0000 mL | Freq: Two times a day (BID) | INTRAMUSCULAR | Status: DC

## 2012-08-11 MED ORDER — ALBUTEROL SULFATE (5 MG/ML) 0.5% IN NEBU
2.5000 mg | INHALATION_SOLUTION | Freq: Once | RESPIRATORY_TRACT | Status: AC
  Administered 2012-08-11: 2.5 mg via RESPIRATORY_TRACT

## 2012-08-11 MED ORDER — FUROSEMIDE 10 MG/ML IJ SOLN
160.0000 mg | Freq: Four times a day (QID) | INTRAVENOUS | Status: DC
  Administered 2012-08-11 – 2012-08-16 (×19): 160 mg via INTRAVENOUS
  Filled 2012-08-11 (×23): qty 16

## 2012-08-11 MED ORDER — FUROSEMIDE 10 MG/ML IJ SOLN
160.0000 mg | Freq: Four times a day (QID) | INTRAVENOUS | Status: DC
  Filled 2012-08-11 (×3): qty 16

## 2012-08-11 MED ORDER — DIAZEPAM 2 MG PO TABS
2.0000 mg | ORAL_TABLET | ORAL | Status: AC
  Administered 2012-08-11: 2 mg via ORAL
  Filled 2012-08-11: qty 1

## 2012-08-11 MED ORDER — SODIUM CHLORIDE 0.9 % IJ SOLN: 3.0000 mL | INTRAMUSCULAR | Status: DC | PRN

## 2012-08-11 MED ORDER — SODIUM CHLORIDE 0.9 % IV SOLN
INTRAVENOUS | Status: DC
  Administered 2012-08-11: 20 mL/h via INTRAVENOUS

## 2012-08-11 MED ORDER — ASPIRIN EC 81 MG PO TBEC
81.0000 mg | DELAYED_RELEASE_TABLET | Freq: Every day | ORAL | Status: DC
  Administered 2012-08-12 – 2012-08-20 (×9): 81 mg via ORAL
  Filled 2012-08-11 (×9): qty 1

## 2012-08-11 MED ORDER — HEPARIN (PORCINE) IN NACL 2-0.9 UNIT/ML-% IJ SOLN
INTRAMUSCULAR | Status: AC
  Filled 2012-08-11: qty 500

## 2012-08-11 MED ORDER — ASPIRIN 81 MG PO CHEW
324.0000 mg | CHEWABLE_TABLET | ORAL | Status: AC
  Administered 2012-08-11: 324 mg via ORAL
  Filled 2012-08-11: qty 4

## 2012-08-11 MED ORDER — FENTANYL CITRATE 0.05 MG/ML IJ SOLN
INTRAMUSCULAR | Status: AC
  Filled 2012-08-11: qty 2

## 2012-08-11 MED ORDER — DOPAMINE-DEXTROSE 3.2-5 MG/ML-% IV SOLN
2.5000 ug/kg/min | INTRAVENOUS | Status: DC
  Administered 2012-08-11 – 2012-08-14 (×2): 2.5 ug/kg/min via INTRAVENOUS
  Filled 2012-08-11 (×2): qty 250

## 2012-08-11 MED ORDER — ACETAMINOPHEN 325 MG PO TABS: 650.0000 mg | ORAL_TABLET | ORAL | Status: DC | PRN

## 2012-08-11 MED ORDER — ONDANSETRON HCL 4 MG/2ML IJ SOLN: 4.0000 mg | Freq: Four times a day (QID) | INTRAMUSCULAR | Status: DC | PRN

## 2012-08-11 MED ORDER — LIDOCAINE HCL (PF) 1 % IJ SOLN
INTRAMUSCULAR | Status: AC
  Filled 2012-08-11: qty 30

## 2012-08-11 NOTE — Progress Notes (Signed)
PFT completed & unconfirmed results placed in Progress Notes of [facility]. PT unable to perform Lung Volume testing despite repeated attempts.

## 2012-08-11 NOTE — H&P (View-Only) (Signed)
Primary cardiologist: Dr. Olga Millers  Subjective:   No chest pain. Complains of worsening dyspnea ("No better than when I came in").   Objective:   Temp:  [97.9 F (36.6 C)-98 F (36.7 C)] 98 F (36.7 C) (11/15 1610) Pulse Rate:  [66-78] 77  (11/15 0632) Resp:  [18-19] 19  (11/15 0632) BP: (100-134)/(53-77) 100/57 mmHg (11/15 0632) SpO2:  [93 %-98 %] 93 % (11/15 9604) Weight:  [202 lb 6.1 oz (91.8 kg)] 202 lb 6.1 oz (91.8 kg) (11/15 0632) Last BM Date: 08/10/12  Filed Weights   08/09/12 0433 08/10/12 0314 08/11/12 5409  Weight: 203 lb 11.3 oz (92.4 kg) 200 lb 9.9 oz (91 kg) 202 lb 6.1 oz (91.8 kg)    Intake/Output Summary (Last 24 hours) at 08/11/12 0710 Last data filed at 08/10/12 2105  Gross per 24 hour  Intake    480 ml  Output    700 ml  Net   -220 ml    Exam:  General: No acute distress.  Lungs: Diminished BS bases (improved)  Cardiac: Regular rate and rhythm, accentuated S2. 2/6 systolic murmur  Abdomen: Protuberant, bowel sounds present, nontender.  Extremities: 1+ ankle edema  Lab Results:  Basic Metabolic Panel:  Lab 08/11/12 8119 08/10/12 0528 08/09/12 0645  NA 128* 134* 135  K 3.3* 3.5 3.5  CL 90* 93* 95*  CO2 27 28 28   GLUCOSE 176* 137* 83  BUN 86* 85* 85*  CREATININE 4.65* 4.52* 4.58*  CALCIUM 9.5 9.7 9.7  MG -- -- --    Liver Function Tests:  Lab 08/11/12 0450 08/10/12 0528 08/09/12 0645  AST -- -- --  ALT -- -- --  ALKPHOS -- -- --  BILITOT -- -- --  PROT -- -- --  ALBUMIN 3.3* 3.3* 3.4*    Medications:   Scheduled Medications:    . amLODipine  10 mg Oral Daily  . aspirin EC  81 mg Oral Daily  . atorvastatin  20 mg Oral q1800  . ferrous sulfate  325 mg Oral BID WC  . furosemide  120 mg Intravenous BID  . heparin  5,000 Units Subcutaneous Q8H  . hydrALAZINE  75 mg Oral Q8H  . insulin aspart  0-15 Units Subcutaneous TID WC  . insulin detemir  15 Units Subcutaneous Daily  . linagliptin  5 mg Oral Daily  .  metoprolol succinate  12.5 mg Oral Daily  . sildenafil  20 mg Oral TID  . sodium chloride  3 mL Intravenous Q12H    Infusions:    PRN Medications: acetaminophen, ALPRAZolam, alum & mag hydroxide-simeth, dextrose, hydrALAZINE, ondansetron (ZOFRAN) IV, sodium chloride   Assessment:   1. Pulmonary arterial hypertension, PASP 60 mmHg (mean 38) by right heart catheterization 11/1. Recent VQ scan not suggesting chronic PE, and hemodynamics not clearly consistent with pericardial constriction. ANA and HIV negative. Echocardiographic bubble study was negative for obvious shunt. He has been started on sildenafil; titrate as outpatient. PFTs pending (will reorder). Outpatient sleep study.  2. Known multivessel CAD status post CABG with cardiac catheterization in March of this year noted in the H&P.  LVEF 50-55%.  3. Acute on chronic renal insufficiency   4. Hypertension.  5. Hyponatremia - fluid restrict; repeat BMET in AM  Plan/Discussion:    Patient now complaining of increasing dyspnea; Nephrology following and adjusting diuretics; may need dialysis;  PFTs pending - reorder; etiology of pulmonary HTN unclear. Continue revatio; ? Addition of milrinone. I will ask Dr Shirlee Latch  to review for further recommendations; we do not seem to be making progress with present therapy.   Olga Millers, M.D., F.A.C.C. 7:10 AM  Discussed with Dr Shirlee Latch; plan right heart catheterization to reassess pulmonary pressures and cardiac output with the addition of revatio; further medication adjustment based on results; may need milrinone for improved RV function. Olga Millers

## 2012-08-11 NOTE — CV Procedure (Signed)
    Cardiac Cath Note  [name] [id number] 1931/07/07  Procedure: right  Heart Cardiac Catheterization Note Indications: persistent CHF, progressive renal failure  Procedure Details Consent: Obtained Time Out: Verified patient identification, verified procedure, site/side was marked, verified correct patient position, special equipment/implants available, Radiology Safety Procedures followed,  medications/allergies/relevent history reviewed, required imaging and test results available.  Performed   Medications: Fentanyl: 50 mcg IV Versed: 2 mg IV  The right femoral vein was easily canulated using a modified Seldinger technique.  A right femoral artery needle stick was used for the arterial sat.  Hemodynamics:   RA: 22/25/21 RV: 70/7 PCWP: 26 PA:  66/24 (42)  Cardiac Output   Thermodilution: 6.22 with index of 2.93  Fick : 9.32 with index of 4.4  Arterial Sat: 98 % on 2 l [place] PA Sat: 73% .   Complications: No apparent complications Patient did tolerate procedure well.  Contrast used:  0   Conclusions:   1. Markedly volume overloaded. He has not had any significant urine output after getting IV Lasix.  He may need dialysis.  Vesta Mixer, Montez Hageman., MD, [facility] 08/11/2012, 11:55 AM Office - [phone] Pager [phone]

## 2012-08-11 NOTE — Interval H&P Note (Signed)
History and Physical Interval Note:  08/11/2012 11:22 AM  [name]  has presented today for surgery, with the diagnosis of cp  The various methods of treatment have been discussed with the patient and family. After consideration of risks, benefits and other options for treatment, the patient has consented to  Procedure(s) (LRB) with comments: RIGHT HEART CATH (N/A) as a surgical intervention .  The patient's history has been reviewed, patient examined, no change in status, stable for surgery.  I have reviewed the patient's chart and labs.  Questions were answered to the patient's satisfaction.     Elyn Aquas.

## 2012-08-11 NOTE — Progress Notes (Addendum)
Primary cardiologist: Dr. Olga Millers  Subjective:   No chest pain. Complains of worsening dyspnea ("No better than when I came in").   Objective:   Temp:  [97.9 F (36.6 C)-98 F (36.7 C)] 98 F (36.7 C) (11/15 5784) Pulse Rate:  [66-78] 77  (11/15 0632) Resp:  [18-19] 19  (11/15 0632) BP: (100-134)/(53-77) 100/57 mmHg (11/15 0632) SpO2:  [93 %-98 %] 93 % (11/15 6962) Weight:  [202 lb 6.1 oz (91.8 kg)] 202 lb 6.1 oz (91.8 kg) (11/15 0632) Last BM Date: 08/10/12  Filed Weights   08/09/12 0433 08/10/12 0314 08/11/12 9528  Weight: 203 lb 11.3 oz (92.4 kg) 200 lb 9.9 oz (91 kg) 202 lb 6.1 oz (91.8 kg)    Intake/Output Summary (Last 24 hours) at 08/11/12 0710 Last data filed at 08/10/12 2105  Gross per 24 hour  Intake    480 ml  Output    700 ml  Net   -220 ml    Exam:  General: No acute distress.  Lungs: Diminished BS bases (improved)  Cardiac: Regular rate and rhythm, accentuated S2. 2/6 systolic murmur  Abdomen: Protuberant, bowel sounds present, nontender.  Extremities: 1+ ankle edema  Lab Results:  Basic Metabolic Panel:  Lab 08/11/12 4132 08/10/12 0528 08/09/12 0645  NA 128* 134* 135  K 3.3* 3.5 3.5  CL 90* 93* 95*  CO2 27 28 28   GLUCOSE 176* 137* 83  BUN 86* 85* 85*  CREATININE 4.65* 4.52* 4.58*  CALCIUM 9.5 9.7 9.7  MG -- -- --    Liver Function Tests:  Lab 08/11/12 0450 08/10/12 0528 08/09/12 0645  AST -- -- --  ALT -- -- --  ALKPHOS -- -- --  BILITOT -- -- --  PROT -- -- --  ALBUMIN 3.3* 3.3* 3.4*    Medications:   Scheduled Medications:    . amLODipine  10 mg Oral Daily  . aspirin EC  81 mg Oral Daily  . atorvastatin  20 mg Oral q1800  . ferrous sulfate  325 mg Oral BID WC  . furosemide  120 mg Intravenous BID  . heparin  5,000 Units Subcutaneous Q8H  . hydrALAZINE  75 mg Oral Q8H  . insulin aspart  0-15 Units Subcutaneous TID WC  . insulin detemir  15 Units Subcutaneous Daily  . linagliptin  5 mg Oral Daily  .  metoprolol succinate  12.5 mg Oral Daily  . sildenafil  20 mg Oral TID  . sodium chloride  3 mL Intravenous Q12H    Infusions:    PRN Medications: acetaminophen, ALPRAZolam, alum & mag hydroxide-simeth, dextrose, hydrALAZINE, ondansetron (ZOFRAN) IV, sodium chloride   Assessment:   1. Pulmonary arterial hypertension, PASP 60 mmHg (mean 38) by right heart catheterization 11/1. Recent VQ scan not suggesting chronic PE, and hemodynamics not clearly consistent with pericardial constriction. ANA and HIV negative. Echocardiographic bubble study was negative for obvious shunt. He has been started on sildenafil; titrate as outpatient. PFTs pending (will reorder). Outpatient sleep study.  2. Known multivessel CAD status post CABG with cardiac catheterization in March of this year noted in the H&P.  LVEF 50-55%.  3. Acute on chronic renal insufficiency   4. Hypertension.  5. Hyponatremia - fluid restrict; repeat BMET in AM  Plan/Discussion:    Patient now complaining of increasing dyspnea; Nephrology following and adjusting diuretics; may need dialysis;  PFTs pending - reorder; etiology of pulmonary HTN unclear. Continue revatio; ? Addition of milrinone. I will ask Dr Shirlee Latch  to review for further recommendations; we do not seem to be making progress with present therapy.   Olga Millers, M.D., F.A.C.C. 7:10 AM  Discussed with Dr Shirlee Latch; plan right heart catheterization to reassess pulmonary pressures and cardiac output with the addition of revatio; further medication adjustment based on results; may need milrinone for improved RV function. Olga Millers   Reviewed RHC with Dr Shirlee Latch; cardiac output remains good; PCWP now elevated; will add renal dose dopamine and increase revatio to 40 mg po tid. Olga Millers

## 2012-08-11 NOTE — Progress Notes (Signed)
Patient ID: [name], male   DOB: 12-Dec-1930, 78 y.o.   MRN: [id number] S:SOB noted O:BP 125/57  Pulse 75  Temp 98.7 F (37.1 C) (Oral)  Resp 18  Ht 5\' 11"  (1.803 m)  Wt 91.8 kg (202 lb 6.1 oz)  BMI 28.23 kg/m2  SpO2 96%  Intake/Output Summary (Last 24 hours) at 08/11/12 1352 Last data filed at 08/11/12 0800  Gross per 24 hour  Intake    480 ml  Output    600 ml  Net   -120 ml   Intake/Output: I/O last 3 completed shifts: In: 483 [P.O.:480; I.V.:3] Out: 700 [Urine:700]  Intake/Output this shift:  Total I/O In: 240 [P.O.:240] Out: 300 [Urine:300] Weight change: 0.8 kg (1 lb 12.2 oz) Gen:WD WN AAM resting in bed in NAD CVS:no rub Resp:CTA HYQ:MVHQIO Ext:1+edema   Lab 08/11/12 0450 08/10/12 0528 08/09/12 0645 08/08/12 0510 08/07/12 0505 08/05/12 0545  NA 128* 134* 135 133* 131* 131*  K 3.3* 3.5 3.5 3.6 3.7 4.3  CL 90* 93* 95* 92* 91* 94*  CO2 27 28 28 29 26 25   GLUCOSE 176* 137* 83 92 127* 124*  BUN 86* 85* 85* 85* 82* 79*  CREATININE 4.65* 4.52* 4.58* 4.26* 4.09* 3.73*  ALBUMIN 3.3* 3.3* 3.4* -- -- --  CALCIUM 9.5 9.7 9.7 9.7 9.4 9.3  PHOS 4.3 4.9* 4.6 -- -- --  AST -- -- -- -- -- --  ALT -- -- -- -- -- --   Liver Function Tests:  Lab 08/11/12 0450 08/10/12 0528 08/09/12 0645  AST -- -- --  ALT -- -- --  ALKPHOS -- -- --  BILITOT -- -- --  PROT -- -- --  ALBUMIN 3.3* 3.3* 3.4*   No results found for this basename: LIPASE:3,AMYLASE:3 in the last 168 hours No results found for this basename: AMMONIA:3 in the last 168 hours CBC:  Lab 08/09/12 0645  WBC 4.8  NEUTROABS --  HGB 8.9*  HCT 26.1*  MCV 83.1  PLT 174   Cardiac Enzymes: No results found for this basename: CKTOTAL:5,CKMB:5,CKMBINDEX:5,TROPONINI:5 in the last 168 hours CBG:  Lab 08/11/12 1054 08/11/12 0634 08/10/12 2103 08/10/12 1639 08/10/12 1121  GLUCAP 156* 142* 207* 108* 121*    Iron Studies: No results found for this basename: IRON,TIBC,TRANSFERRIN,FERRITIN in the last 72  hours Studies/Results: No results found.    . [COMPLETED] albuterol  2.5 mg Nebulization Once  . amLODipine  10 mg Oral Daily  . [COMPLETED] aspirin  324 mg Oral Pre-Cath  . aspirin EC  81 mg Oral Daily  . atorvastatin  20 mg Oral q1800  . [COMPLETED] diazepam  2 mg Oral On Call  . [COMPLETED] fentaNYL      . ferrous sulfate  325 mg Oral BID WC  . furosemide  120 mg Intravenous BID  . [COMPLETED] heparin      . heparin  5,000 Units Subcutaneous Q8H  . hydrALAZINE  75 mg Oral Q8H  . insulin aspart  0-15 Units Subcutaneous TID WC  . insulin detemir  15 Units Subcutaneous Daily  . [COMPLETED] lidocaine      . linagliptin  5 mg Oral Daily  . metoprolol succinate  12.5 mg Oral Daily  . [COMPLETED] midazolam      . sildenafil  20 mg Oral TID  . sodium chloride  3 mL Intravenous Q12H  . [DISCONTINUED] aspirin EC  81 mg Oral Daily  . [DISCONTINUED] sodium chloride  3 mL Intravenous Q12H  BMET    Component Value Date/Time   NA 128* 08/11/2012 0450   K 3.3* 08/11/2012 0450   CL 90* 08/11/2012 0450   CO2 27 08/11/2012 0450   GLUCOSE 176* 08/11/2012 0450   BUN 86* 08/11/2012 0450   CREATININE 4.65* 08/11/2012 0450   CREATININE 1.89* 12/20/2011 1233   CALCIUM 9.5 08/11/2012 0450   GFRNONAA 11* 08/11/2012 0450   GFRAA 13* 08/11/2012 0450   CBC    Component Value Date/Time   WBC 4.8 08/09/2012 0645   RBC 3.14* 08/09/2012 0645   HGB 8.9* 08/09/2012 0645   HCT 26.1* 08/09/2012 0645   PLT 174 08/09/2012 0645   MCV 83.1 08/09/2012 0645   MCH 28.3 08/09/2012 0645   MCHC 34.1 08/09/2012 0645   RDW 14.9 08/09/2012 0645   LYMPHSABS 2.3 01/10/2012 1023   MONOABS 0.4 01/10/2012 1023   EOSABS 0.0 01/10/2012 1023   BASOSABS 0.0 01/10/2012 1023    Assessment/Plan:  1. AKI/CKD- Pt has Diuresed well during his hospitalization and is 7kg lighter than his admission weight.  However, over the last 24hours he developed SOB and was changed to IV lasix.  He also was taken to cath lab for  right heart cath which revealed volume overload.  His creatinine had reached a plateau but is up somewhat today. Will  change lasix back to IV and follow daily weights and strict I's/O's. 2. CHF- as above, weight is dropping daily. Down over 7kg. Recent change in volume status but overall weight only up 0.8kg so concerned something else is driving the SOB 3. CAD- had chest pain.  ?new ischemic event.  W/u per cards. 4. HTN- stable 5. Pulmonary HTN- unclear etiology. Agree with sleep study 6. Anemia of chronic disease- follow iron stores and start esa 7. Iron deficiency- given dose of feraheme 8. DM- per primary svc 9. Dysproteinemia- +kappa/lamda ratio. Await immunofixation. 10. Vascular access- will need vein mapping and possible AVF placement during this hospitalization. 11. Hyponatremia- due to decompensated CHF.  Increase lasix as above. 12.  [name] A

## 2012-08-12 LAB — RENAL FUNCTION PANEL
Calcium: 9.5 mg/dL (ref 8.4–10.5)
Creatinine, Ser: 4.36 mg/dL — ABNORMAL HIGH (ref 0.50–1.35)
GFR calc Af Amer: 14 mL/min — ABNORMAL LOW (ref >90)
GFR calc non Af Amer: 12 mL/min — ABNORMAL LOW (ref >90)
Phosphorus: 4.8 mg/dL — ABNORMAL HIGH (ref 2.3–4.6)
Sodium: 132 mEq/L — ABNORMAL LOW (ref 135–145)

## 2012-08-12 LAB — GLUCOSE, CAPILLARY: Glucose-Capillary: 122 mg/dL — ABNORMAL HIGH (ref 70–99)

## 2012-08-12 LAB — TSH: TSH: 2.25 u[IU]/mL (ref 0.350–4.500)

## 2012-08-12 NOTE — Progress Notes (Signed)
Patient ID: ROCHESTER SERPE, male   DOB: 06-10-1931, 78 y.o.   MRN: [id number] S:feels better O:BP 119/61  Pulse 72  Temp 98.3 F (36.8 C) (Oral)  Resp 20  Ht 5\' 11"  (1.803 m)  Wt 94.1 kg (207 lb 7.3 oz)  BMI 28.93 kg/m2  SpO2 91%  Intake/Output Summary (Last 24 hours) at 08/12/12 0819 Last data filed at 08/12/12 0645  Gross per 24 hour  Intake 389.12 ml  Output    950 ml  Net -560.88 ml   Intake/Output: I/O last 3 completed shifts: In: 629.1 [P.O.:240; I.V.:257.1; IV Piggyback:132] Out: 1550 [Urine:1550]  Intake/Output this shift:    Weight change: 2.3 kg (5 lb 1.1 oz) Gen:WD elderly AAM in NAD CVS:no rub Resp: minimal early inspiratory crackles at base of left lung field [id number] Ext:no edema   Lab 08/12/12 0515 08/11/12 0450 08/10/12 0528 08/09/12 0645 08/08/12 0510 08/07/12 0505  NA 132* 128* 134* 135 133* 131*  K 3.5 3.3* 3.5 3.5 3.6 3.7  CL 93* 90* 93* 95* 92* 91*  CO2 27 27 28 28 29 26   GLUCOSE 171* 176* 137* 83 92 127*  BUN 85* 86* 85* 85* 85* 82*  CREATININE 4.36* 4.65* 4.52* 4.58* 4.26* 4.09*  ALBUMIN 3.3* 3.3* 3.3* 3.4* -- --  CALCIUM 9.5 9.5 9.7 9.7 9.7 9.4  PHOS 4.8* 4.3 4.9* 4.6 -- --  AST -- -- -- -- -- --  ALT -- -- -- -- -- --   Liver Function Tests:  Lab 08/12/12 0515 08/11/12 0450 08/10/12 0528  AST -- -- --  ALT -- -- --  ALKPHOS -- -- --  BILITOT -- -- --  PROT -- -- --  ALBUMIN 3.3* 3.3* 3.3*   No results found for this basename: LIPASE:3,AMYLASE:3 in the last 168 hours No results found for this basename: AMMONIA:3 in the last 168 hours CBC:  Lab 08/09/12 0645  WBC 4.8  NEUTROABS --  HGB 8.9*  HCT 26.1*  MCV 83.1  PLT 174   Cardiac Enzymes: No results found for this basename: CKTOTAL:5,CKMB:5,CKMBINDEX:5,TROPONINI:5 in the last 168 hours CBG:  Lab 08/11/12 2215 08/11/12 1602 08/11/12 1054 08/11/12 0634 08/10/12 2103  GLUCAP 222* 125* 156* 142* 207*    Iron Studies: No results found for this basename:  IRON,TIBC,TRANSFERRIN,FERRITIN in the last 72 hours Studies/Results: No results found.    . [COMPLETED] albuterol  2.5 mg Nebulization Once  . amLODipine  10 mg Oral Daily  . [COMPLETED] aspirin  324 mg Oral Pre-Cath  . aspirin EC  81 mg Oral Daily  . atorvastatin  20 mg Oral q1800  . [COMPLETED] diazepam  2 mg Oral On Call  . [COMPLETED] fentaNYL      . ferrous sulfate  325 mg Oral BID WC  . furosemide  160 mg Intravenous Q6H  . [COMPLETED] heparin      . heparin  5,000 Units Subcutaneous Q8H  . hydrALAZINE  75 mg Oral Q8H  . insulin aspart  0-15 Units Subcutaneous TID WC  . insulin detemir  15 Units Subcutaneous Daily  . [COMPLETED] lidocaine      . linagliptin  5 mg Oral Daily  . metoprolol succinate  12.5 mg Oral Daily  . [COMPLETED] midazolam      . sildenafil  40 mg Oral TID  . sodium chloride  3 mL Intravenous Q12H  . [DISCONTINUED] aspirin EC  81 mg Oral Daily  . [DISCONTINUED] furosemide  120 mg Intravenous BID  . [DISCONTINUED] furosemide  160 mg Intravenous Q6H  . [DISCONTINUED] sildenafil  20 mg Oral TID  . [DISCONTINUED] sodium chloride  3 mL Intravenous Q12H    BMET    Component Value Date/Time   NA 132* 08/12/2012 0515   K 3.5 08/12/2012 0515   CL 93* 08/12/2012 0515   CO2 27 08/12/2012 0515   GLUCOSE 171* 08/12/2012 0515   BUN 85* 08/12/2012 0515   CREATININE 4.36* 08/12/2012 0515   CREATININE 1.89* 12/20/2011 1233   CALCIUM 9.5 08/12/2012 0515   GFRNONAA 12* 08/12/2012 0515   GFRAA 14* 08/12/2012 0515   CBC    Component Value Date/Time   WBC 4.8 08/09/2012 0645   RBC 3.14* 08/09/2012 0645   HGB 8.9* 08/09/2012 0645   HCT 26.1* 08/09/2012 0645   PLT 174 08/09/2012 0645   MCV 83.1 08/09/2012 0645   MCH 28.3 08/09/2012 0645   MCHC 34.1 08/09/2012 0645   RDW 14.9 08/09/2012 0645   LYMPHSABS 2.3 01/10/2012 1023   MONOABS 0.4 01/10/2012 1023   EOSABS 0.0 01/10/2012 1023   BASOSABS 0.0 01/10/2012 1023    Assessment/Plan:  1. AKI/CKD- Pt has  Diuresed well during the last 24 hours after resuming IV lasix and initiation of revatio and dopamine for right heart cath which revealed volume overload. His creatinine has reached a plateau.  Will change lasix back to IV and follow daily weights and strict I's/O's. 2. CHF- as above, improved with IV lasix/revatio/dopamine.   3. CAD- had chest pain. ?new ischemic event. W/u per cards. 4. HTN- stable 5. Pulmonary HTN- unclear etiology. Agree with sleep study 6. Anemia of chronic disease- follow iron stores and on esa 7. Iron deficiency- given dose of feraheme 8. DM- per primary svc 9. Dysproteinemia- +kappa/lamda ratio. Await immunofixation. 10. Vascular access- will need vein mapping and possible AVF placement during this hospitalization. 11. Hyponatremia- due to decompensated CHF. Improved with increase lasix as above. 12. Dispo- discussed possibility of HD with the patient and he is not sure that he would proceed.  He understands the implications and given his advanced age and multiple comorbidities, I agree with his hesitation.  Will off education nonetheless and follow.  Currently no indication for RRT but if he chooses not to proceed, we may need to involve hospice.  Letishia Elliott A

## 2012-08-12 NOTE — Progress Notes (Signed)
Patient ID: MARSHALL KAMPF, male   DOB: Sep 01, 1931, 78 y.o.   MRN: [id number]   Patient Name: [name] Date of Encounter: 08/12/2012    SUBJECTIVE  [name] is doing better today. He diuresed over 500 cc as outlined by Dr.Coldonato. Continues on IV dopamine and IV Lasix drip. He is very hesitant about going on dialysis. He has severe pulmonary hypertension from unknown etiology. Dr. [name] and others.  CURRENT MEDS    . amLODipine  10 mg Oral Daily  . [COMPLETED] aspirin  324 mg Oral Pre-Cath  . aspirin EC  81 mg Oral Daily  . atorvastatin  20 mg Oral q1800  . [COMPLETED] diazepam  2 mg Oral On Call  . [COMPLETED] fentaNYL      . ferrous sulfate  325 mg Oral BID WC  . furosemide  160 mg Intravenous Q6H  . [COMPLETED] heparin      . heparin  5,000 Units Subcutaneous Q8H  . hydrALAZINE  75 mg Oral Q8H  . insulin aspart  0-15 Units Subcutaneous TID WC  . insulin detemir  15 Units Subcutaneous Daily  . [COMPLETED] lidocaine      . linagliptin  5 mg Oral Daily  . metoprolol succinate  12.5 mg Oral Daily  . [COMPLETED] midazolam      . sildenafil  40 mg Oral TID  . sodium chloride  3 mL Intravenous Q12H  . [DISCONTINUED] furosemide  120 mg Intravenous BID  . [DISCONTINUED] furosemide  160 mg Intravenous Q6H  . [DISCONTINUED] sildenafil  20 mg Oral TID    OBJECTIVE  Filed Vitals:   08/11/12 1300 08/11/12 1400 08/11/12 2213 08/12/12 0519  BP: 125/57 116/57 118/65 119/61  Pulse: 75 70 77 72  Temp:   97.8 F (36.6 C) 98.3 F (36.8 C)  TempSrc:   Oral Oral  Resp:   19 20  Height:      Weight:    207 lb 7.3 oz (94.1 kg)  SpO2: 96% 96% 90% 91%    Intake/Output Summary (Last 24 hours) at 08/12/12 1020 Last data filed at 08/12/12 0645  Gross per 24 hour  Intake 389.12 ml  Output    950 ml  Net -560.88 ml   Filed Weights   08/10/12 0314 08/11/12 0632 08/12/12 0519  Weight: 200 lb 9.9 oz (91 kg) 202 lb 6.1 oz (91.8 kg) 207 lb 7.3 oz (94.1 kg)    PHYSICAL  EXAM  General: Pleasant, NAD. Chronically ill-appearing Neuro: Alert and oriented X 3. Moves all extremities spontaneously. Psych: Anxious HEENT:  Normal  Neck: Supple without bruits or JVD. Lungs:  Resp regular and unlabored, CTA. Heart: RRR no s3, s4, or murmurs. Abdomen: Soft, non-tender, non-distended, BS + x 4.  Extremities: No clubbing, cyanosis, 1+ edema bilaterally DP/PT/Radials 2+ and equal bilaterally.  Accessory Clinical Findings  CBC No results found for this basename: WBC:2,NEUTROABS:2,HGB:2,HCT:2,MCV:2,PLT:2 in the last 72 hours Basic Metabolic Panel  Basename 08/12/12 0515 08/11/12 0450  NA 132* 128*  K 3.5 3.3*  CL 93* 90*  CO2 27 27  GLUCOSE 171* 176*  BUN 85* 86*  CREATININE 4.36* 4.65*  CALCIUM 9.5 9.5  MG -- --  PHOS 4.8* 4.3   Liver Function Tests  Basename 08/12/12 0515 08/11/12 0450  AST -- --  ALT -- --  ALKPHOS -- --  BILITOT -- --  PROT -- --  ALBUMIN 3.3* 3.3*   No results found for this basename: LIPASE:2,AMYLASE:2 in the last 72 hours  Cardiac Enzymes No results found for this basename: CKTOTAL:3,CKMB:3,CKMBINDEX:3,TROPONINI:3 in the last 72 hours BNP No components found with this basename: POCBNP:3 D-Dimer No results found for this basename: DDIMER:2 in the last 72 hours Hemoglobin A1C No results found for this basename: HGBA1C in the last 72 hours Fasting Lipid Panel No results found for this basename: CHOL,HDL,LDLCALC,TRIG,CHOLHDL,LDLDIRECT in the last 72 hours Thyroid Function Tests  Basename 08/11/12 1404  TSH 2.250  T4TOTAL --  T3FREE --  THYROIDAB --    TELE  Normal sinus rhythm  ECG   Radiology/Studies  Dg Chest 2 View  08/07/2012  *RADIOLOGY REPORT*  Clinical Data: Shortness of breath with pleural effusion.  CHEST - 2 VIEW  Comparison: 08/06/2012  Findings: Stable asymmetric elevation of the right hemidiaphragm. Persistent vascular congestion with probable interstitial pulmonary edema. As before, there is  bibasilar atelectasis or infiltrate, right greater than left.  There are small bilateral pleural effusions.  IMPRESSION: Stable.  Vascular congestion with interstitial edema.  Bibasilar atelectasis/infiltrate with small bilateral pleural effusions.   Original Report Authenticated By: Kennith Center, M.D.    Dg Chest 2 View  08/06/2012  *RADIOLOGY REPORT*  Clinical Data: Shortness of breath.  Congestive heart failure. Diabetes.  Hypertension.  CHEST - 2 VIEW  Comparison: 08/02/2012  Findings: Cardiomegaly is again observed.  Prior CABG noted. Continued indistinct right perihilar and basilar airspace opacity along with left retrocardiac airspace opacity.  Blunted costophrenic angles, right greater than left.  IMPRESSION:  1.  Essentially stable appearance of cardiomegaly, presumed edema, and right greater than left pleural effusions.   Original Report Authenticated By: Gaylyn Rong, M.D.    Dg Chest 2 View  08/02/2012  *RADIOLOGY REPORT*  Clinical Data: Chest pain, shortness of breath  CHEST - 2 VIEW  Comparison: 07/25/2012  Findings: Coronary bypass changes noted.  Heart is enlarged with asymmetric airspace disease versus edema.  Enlarging right pleural effusion.  CHF is favored.  No pneumothorax.  IMPRESSION: Slight worsening asymmetric airspace disease versus edema with an enlarging right pleural effusion.  Worsening CHF is favored.   Original Report Authenticated By: Judie Petit. Miles Costain, M.D.    Dg Chest 2 View  07/25/2012  *RADIOLOGY REPORT*  Clinical Data: Chest pain and short of breath  CHEST - 2 VIEW  Comparison: 12/22/2011  Findings: Prior CABG.  Cardiac enlargement with pulmonary vascular congestion and mild edema.  Small pleural effusions bilaterally with mild bibasilar atelectasis.  IMPRESSION: Congestive heart failure with mild edema and small pleural effusions.   Original Report Authenticated By: Camelia Phenes, M.D.    Ct Head Wo Contrast  07/24/2012  *RADIOLOGY REPORT*  Clinical Data: Code  stroke.  Slurred speech, left facial droop.  CT HEAD WITHOUT CONTRAST  Technique:  Contiguous axial images were obtained from the base of the skull through the vertex without contrast.  Comparison: 01/23/2009  Findings: There is atrophy and chronic small vessel disease changes. No acute intracranial abnormality.  Specifically, no hemorrhage, hydrocephalus, mass lesion, acute infarction, or significant intracranial injury.  No acute calvarial abnormality. Visualized paranasal sinuses and mastoids clear.  Orbital soft tissues unremarkable.  IMPRESSION: No acute intracranial abnormality.  Atrophy, chronic microvascular disease.   Original Report Authenticated By: Cyndie Chime, M.D.    US Renal  08/04/2012  *RADIOLOGY REPORT*  Clinical Data: Acute on chronic kidney disease  RENAL/URINARY TRACT ULTRASOUND COMPLETE  Comparison:  11/01/2002  Findings:  Right Kidney:  Right kidney is diffusely echogenic.  It is normal in size with no  masses, stones or hydronephrosis.  Right kidney measures 10.8 cm.  Left Kidney:  Left kidney is diffusely echogenic.  It is normal in size with no masses, stones or hydronephrosis.  It measures 10.6 cm.  Bladder:  Unremarkable.  Bilateral pleural effusions are incidentally noted.  IMPRESSION: Diffusely echogenic kidneys consistent with medical renal disease. Kidneys remain normal in size.  No hydronephrosis or renal masses.   Original Report Authenticated By: Amie Portland, M.D.    Nm Pulmonary Perf And Vent  07/26/2012  *RADIOLOGY REPORT*  Clinical Data: Chest pain and shortness of breath for the past week.  NM PULMONARY VENTILATION AND PERFUSION SCAN  Radiopharmaceutical: CURIE MAA TECHNETIUM TO 33M ALBUMIN AGGREGATED and 40 mCi technetium 48m DTPA  Comparison: Chest radiographs obtained yesterday.  Findings: Normal ventilation of both lungs.  Normal perfusion of both lungs.  IMPRESSION: Normal examination.  No evidence of pulmonary embolism.   Original Report Authenticated By:  Darrol Angel, M.D.    Dg Bone Survey Met  08/08/2012  *RADIOLOGY REPORT*  Clinical Data: Positive capital light chain disease.  Rule out lytic multiple myeloma lesions.  METASTATIC BONE SURVEY  Comparison: Chest radiograph, 08/07/2012  Findings: Imaging included and lateral images fall, imaging of the spine, imaging of the chest and pelvis and imaging of the humeri and femurs.  There are no lytic lesions to suggest multiple myeloma.  The chest radiograph shows hazy opacity at the right base with less opacity at the medial left lung base unchanged from the previous day's study.  There are degenerative changes throughout the visualized spine. There is a mild compression fracture of L1 that is likely old. There are degenerative changes with chondrocalcinosis of both knees.  There are degenerative changes of both AC joints and of the left glenohumeral joint.  The bones are diffusely demineralized.  IMPRESSION: No osteolytic lesions to suggest multiple myeloma.  No evidence of neoplastic disease to bone.   Original Report Authenticated By: Amie Portland, M.D.     ASSESSMENT AND PLAN  Principal Problem:  *Acute on chronic diastolic CHF (congestive heart failure) Active Problems:  DIABETES MELLITUS  HYPERLIPIDEMIA  HYPERTENSION  CORONARY ARTERY DISEASE  Hypoglycemia  Pulmonary arterial hypertension  Acute on chronic renal insufficiency    No change in cardiac strategy today. Plan per nephrology. If CODE STATUS has not been addressed that needs to be. I've spoken to patient about being very hesitant dialysis with his pulmonary hypertension and other comorbidities.  Signed, Valera Castle MD

## 2012-08-13 LAB — RENAL FUNCTION PANEL
CO2: 28 mEq/L (ref 19–32)
Calcium: 9.6 mg/dL (ref 8.4–10.5)
Creatinine, Ser: 4.79 mg/dL — ABNORMAL HIGH (ref 0.50–1.35)
GFR calc non Af Amer: 10 mL/min — ABNORMAL LOW (ref >90)
Glucose, Bld: 122 mg/dL — ABNORMAL HIGH (ref 70–99)
Phosphorus: 5.1 mg/dL — ABNORMAL HIGH (ref 2.3–4.6)
Sodium: 132 mEq/L — ABNORMAL LOW (ref 135–145)

## 2012-08-13 LAB — GLUCOSE, CAPILLARY
Glucose-Capillary: 148 mg/dL — ABNORMAL HIGH (ref 70–99)
Glucose-Capillary: 153 mg/dL — ABNORMAL HIGH (ref 70–99)
Glucose-Capillary: 116 mg/dL — ABNORMAL HIGH (ref 70–99)
Glucose-Capillary: 129 mg/dL — ABNORMAL HIGH (ref 70–99)

## 2012-08-13 MED ORDER — DARBEPOETIN ALFA-POLYSORBATE 60 MCG/0.3ML IJ SOLN
60.0000 ug | INTRAMUSCULAR | Status: DC
  Administered 2012-08-14: 60 ug via SUBCUTANEOUS
  Filled 2012-08-13 (×3): qty 0.3

## 2012-08-13 NOTE — Progress Notes (Signed)
Patient ID: [name], male   DOB: 1930/10/30, 78 y.o.   MRN: [id number] S:no new complaints O:BP 117/66  Pulse 77  Temp 99.9 F (37.7 C) (Oral)  Resp 20  Ht 5\' 11"  (1.803 m)  Wt 91.944 kg (202 lb 11.2 oz)  BMI 28.27 kg/m2  SpO2 92%  Intake/Output Summary (Last 24 hours) at 08/13/12 0956 Last data filed at 08/13/12 0454  Gross per 24 hour  Intake  48.38 ml  Output    775 ml  Net -726.62 ml   Intake/Output: I/O last 3 completed shifts: In: 497.5 [P.O.:60; I.V.:305.5; IV Piggyback:132] Out: 1425 [Urine:1425]  Intake/Output this shift:    Weight change: -2.156 kg (-4 lb 12.1 oz) Gen:WD WN elderly AAM in NAD CVS:no rub Resp:decreased BS at bases HYQ:MVHQIO NGE:XBMWU pretib edema   Lab 08/13/12 0845 08/12/12 0515 08/11/12 0450 08/10/12 0528 08/09/12 0645 08/08/12 0510 08/07/12 0505  NA 132* 132* 128* 134* 135 133* 131*  K 3.9 3.5 3.3* 3.5 3.5 3.6 3.7  CL 92* 93* 90* 93* 95* 92* 91*  CO2 28 27 27 28 28 29 26   GLUCOSE 122* 171* 176* 137* 83 92 127*  BUN 85* 85* 86* 85* 85* 85* 82*  CREATININE 4.79* 4.36* 4.65* 4.52* 4.58* 4.26* 4.09*  ALBUMIN 3.6 3.3* 3.3* 3.3* 3.4* -- --  CALCIUM 9.6 9.5 9.5 9.7 9.7 9.7 9.4  PHOS 5.1* 4.8* 4.3 4.9* 4.6 -- --  AST -- -- -- -- -- -- --  ALT -- -- -- -- -- -- --   Liver Function Tests:  Lab 08/13/12 0845 08/12/12 0515 08/11/12 0450  AST -- -- --  ALT -- -- --  ALKPHOS -- -- --  BILITOT -- -- --  PROT -- -- --  ALBUMIN 3.6 3.3* 3.3*   No results found for this basename: LIPASE:3,AMYLASE:3 in the last 168 hours No results found for this basename: AMMONIA:3 in the last 168 hours CBC:  Lab 08/09/12 0645  WBC 4.8  NEUTROABS --  HGB 8.9*  HCT 26.1*  MCV 83.1  PLT 174   Cardiac Enzymes: No results found for this basename: CKTOTAL:5,CKMB:5,CKMBINDEX:5,TROPONINI:5 in the last 168 hours CBG:  Lab 08/13/12 0558 08/12/12 2045 08/12/12 1631 08/12/12 1123 08/12/12 0639  GLUCAP 153* 148* 122* 143* 139*    Iron Studies: No  results found for this basename: IRON,TIBC,TRANSFERRIN,FERRITIN in the last 72 hours Studies/Results: No results found.    Marland Kitchen amLODipine  10 mg Oral Daily  . aspirin EC  81 mg Oral Daily  . atorvastatin  20 mg Oral q1800  . ferrous sulfate  325 mg Oral BID WC  . furosemide  160 mg Intravenous Q6H  . heparin  5,000 Units Subcutaneous Q8H  . hydrALAZINE  75 mg Oral Q8H  . insulin aspart  0-15 Units Subcutaneous TID WC  . insulin detemir  15 Units Subcutaneous Daily  . linagliptin  5 mg Oral Daily  . metoprolol succinate  12.5 mg Oral Daily  . sildenafil  40 mg Oral TID  . sodium chloride  3 mL Intravenous Q12H    BMET    Component Value Date/Time   NA 132* 08/13/2012 0845   K 3.9 08/13/2012 0845   CL 92* 08/13/2012 0845   CO2 28 08/13/2012 0845   GLUCOSE 122* 08/13/2012 0845   BUN 85* 08/13/2012 0845   CREATININE 4.79* 08/13/2012 0845   CREATININE 1.89* 12/20/2011 1233   CALCIUM 9.6 08/13/2012 0845   GFRNONAA 10* 08/13/2012 0845  GFRAA 12* 08/13/2012 0845   CBC    Component Value Date/Time   WBC 4.8 08/09/2012 0645   RBC 3.14* 08/09/2012 0645   HGB 8.9* 08/09/2012 0645   HCT 26.1* 08/09/2012 0645   PLT 174 08/09/2012 0645   MCV 83.1 08/09/2012 0645   MCH 28.3 08/09/2012 0645   MCHC 34.1 08/09/2012 0645   RDW 14.9 08/09/2012 0645   LYMPHSABS 2.3 01/10/2012 1023   MONOABS 0.4 01/10/2012 1023   EOSABS 0.0 01/10/2012 1023   BASOSABS 0.0 01/10/2012 1023    Assessment/Plan:  1. AKI/CKD- Pt has cont to diurese slowly after resuming IV lasix and initiation of revatio and dopamine for right heart cath which revealed volume overload. His creatinine has reached Cooke plateau. Will cont with IV lasix and follow daily weights and strict I's/O's.  I again discussed HD with the patient and has not completely made up his mind.  He has the CKD brochure but has not read it yet.  will ask [facility] to evaluate for AVG/AVF. 2. CHF- as above, improved with IV lasix/revatio/dopamine.  3. CAD- had  chest pain. ?new ischemic event. W/u per cards. 4. HTN- stable 5. Pulmonary HTN- unclear etiology. Agree with sleep study 6. Anemia of chronic disease- follow iron stores and on esa 7. Iron deficiency- given dose of feraheme 8. DM- per primary svc 9. Dysproteinemia- +kappa/lamda ratio. Await immunofixation. 10. Vascular access- will need vein mapping and possible AVF placement during this hospitalization. 11. Hyponatremia- due to decompensated CHF. Improved with increase lasix as above. 12. Dispo- discussed possibility of HD with the patient and he is not sure that he would proceed. He understands the implications and given his advanced age and multiple comorbidities, I agree with his hesitation. Will off education nonetheless and follow. Currently no indication for RRT but if he chooses not to proceed, we may need to involve hospice. 13.   [name]

## 2012-08-13 NOTE — Progress Notes (Signed)
Subjective: Have been following [name] course: voume overload, renal insufficiency, pulmonary hypertension, ischemic cardiomyopathy and diabetes. He has been able to diurese fairly well but renal function remains poor. He is awake and alert and voices no complaints  Objective: Lab: Lab Results  Component Value Date   WBC 4.8 08/09/2012   HGB 8.9* 08/09/2012   HCT 26.1* 08/09/2012   MCV 83.1 08/09/2012   PLT 174 08/09/2012   BMET    Component Value Date/Time   NA 132* 08/12/2012 0515   K 3.5 08/12/2012 0515   CL 93* 08/12/2012 0515   CO2 27 08/12/2012 0515   GLUCOSE 171* 08/12/2012 0515   BUN 85* 08/12/2012 0515   CREATININE 4.36* 08/12/2012 0515   CREATININE 1.89* 12/20/2011 1233   CALCIUM 9.5 08/12/2012 0515   GFRNONAA 12* 08/12/2012 0515   GFRAA 14* 08/12/2012 0515     Imaging: no new imaging  Scheduled Meds:   . amLODipine  10 mg Oral Daily  . aspirin EC  81 mg Oral Daily  . atorvastatin  20 mg Oral q1800  . ferrous sulfate  325 mg Oral BID WC  . furosemide  160 mg Intravenous Q6H  . heparin  5,000 Units Subcutaneous Q8H  . hydrALAZINE  75 mg Oral Q8H  . insulin aspart  0-15 Units Subcutaneous TID WC  . insulin detemir  15 Units Subcutaneous Daily  . linagliptin  5 mg Oral Daily  . metoprolol succinate  12.5 mg Oral Daily  . sildenafil  40 mg Oral TID  . sodium chloride  3 mL Intravenous Q12H   Continuous Infusions:   . DOPamine 2.5 mcg/kg/min (08/11/12 1728)   PRN Meds:.acetaminophen, ALPRAZolam, dextrose, hydrALAZINE, ondansetron (ZOFRAN) IV, sodium chloride   Physical Exam: Filed Vitals:   08/13/12 0452  BP: 117/66  Pulse: 77  Temp: 99.9 F (37.7 C)  Resp: 20   gen'l - older but strong AA man in no distress HEENT- C&S clear Cor - 2+ radial, RRR Pulm - no increased WOB at rest Abd - soft     Assessment/Plan: DM -  CBG (last 3)   Basename 08/13/12 0558 08/12/12 2045 08/12/12 1631  GLUCAP 153* 148* 122*    Adequately controlled  on present regimen  Discussed dialysis with him: he is willing to have dialysis if that is what he needs. Touched upon Code status - wishes full treatment at this time = full code.    [name] (o) [phone]; (c) [phone] Call-grp - [name] Tele: [phone]  08/13/2012, 7:52 AM

## 2012-08-13 NOTE — Progress Notes (Signed)
No monoclonal proteins on serum IFE but monoclonal kappa light chains in urine.  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 No futher workup indicated at this time.

## 2012-08-13 NOTE — Progress Notes (Signed)
Patient's breathing is exacerbated with exertion. Declines ambulating at this time. Will continue to monitor. [name]

## 2012-08-13 NOTE — Progress Notes (Signed)
Patient ID: [name], male   DOB: 04/10/1931, 78 y.o.   MRN: [id number]   Patient Name: [name] Date of Encounter: 08/13/2012    SUBJECTIVE Resting comfortably. Already seen today by Dr. Debby Bud and [place].  CURRENT MEDS    . amLODipine  10 mg Oral Daily  . aspirin EC  81 mg Oral Daily  . atorvastatin  20 mg Oral q1800  . darbepoetin (ARANESP) injection - NON-DIALYSIS  60 mcg Subcutaneous Q Mon-1800  . ferrous sulfate  325 mg Oral BID WC  . furosemide  160 mg Intravenous Q6H  . heparin  5,000 Units Subcutaneous Q8H  . hydrALAZINE  75 mg Oral Q8H  . insulin aspart  0-15 Units Subcutaneous TID WC  . insulin detemir  15 Units Subcutaneous Daily  . linagliptin  5 mg Oral Daily  . metoprolol succinate  12.5 mg Oral Daily  . sildenafil  40 mg Oral TID  . sodium chloride  3 mL Intravenous Q12H    OBJECTIVE  Filed Vitals:   08/12/12 1427 08/12/12 1950 08/13/12 0452 08/13/12 0458  BP: 111/47 122/63 117/66   Pulse:  76 77   Temp:  98.5 F (36.9 C) 99.9 F (37.7 C)   TempSrc:  Oral Oral   Resp:  18 20   Height:      Weight:    202 lb 11.2 oz (91.944 kg)  SpO2:  94% 92%     Intake/Output Summary (Last 24 hours) at 08/13/12 1028 Last data filed at 08/13/12 0900  Gross per 24 hour  Intake 288.38 ml  Output    400 ml  Net -111.62 ml   Filed Weights   08/11/12 0632 08/12/12 0519 08/13/12 0458  Weight: 202 lb 6.1 oz (91.8 kg) 207 lb 7.3 oz (94.1 kg) 202 lb 11.2 oz (91.944 kg)    PHYSICAL EXAM    Accessory Clinical Findings  CBC No results found for this basename: WBC:2,NEUTROABS:2,HGB:2,HCT:2,MCV:2,PLT:2 in the last 72 hours Basic Metabolic Panel  Basename 08/13/12 0845 08/12/12 0515  NA 132* 132*  K 3.9 3.5  CL 92* 93*  CO2 28 27  GLUCOSE 122* 171*  BUN 85* 85*  CREATININE 4.79* 4.36*  CALCIUM 9.6 9.5  MG -- --  PHOS 5.1* 4.8*   Liver Function Tests  Basename 08/13/12 0845 08/12/12 0515  AST -- --  ALT -- --  ALKPHOS -- --    BILITOT -- --  PROT -- --  ALBUMIN 3.6 3.3*   No results found for this basename: LIPASE:2,AMYLASE:2 in the last 72 hours Cardiac Enzymes No results found for this basename: CKTOTAL:3,CKMB:3,CKMBINDEX:3,TROPONINI:3 in the last 72 hours BNP No components found with this basename: POCBNP:3 D-Dimer No results found for this basename: DDIMER:2 in the last 72 hours Hemoglobin A1C No results found for this basename: HGBA1C in the last 72 hours Fasting Lipid Panel No results found for this basename: CHOL,HDL,LDLCALC,TRIG,CHOLHDL,LDLDIRECT in the last 72 hours Thyroid Function Tests  Basename 08/11/12 1404  TSH 2.250  T4TOTAL --  T3FREE --  THYROIDAB --    TELE  NSR  ECG    Radiology/Studies  Dg Chest 2 View  08/07/2012  *RADIOLOGY REPORT*  Clinical Data: Shortness of breath with pleural effusion.  CHEST - 2 VIEW  Comparison: 08/06/2012  Findings: Stable asymmetric elevation of the right hemidiaphragm. Persistent vascular congestion with probable interstitial pulmonary edema. As before, there is bibasilar atelectasis or infiltrate, right greater than left.  There are small bilateral pleural effusions.  IMPRESSION: Stable.  Vascular congestion with interstitial edema.  Bibasilar atelectasis/infiltrate with small bilateral pleural effusions.   Original Report Authenticated By: Kennith Center, M.D.    Dg Chest 2 View  08/06/2012  *RADIOLOGY REPORT*  Clinical Data: Shortness of breath.  Congestive heart failure. Diabetes.  Hypertension.  CHEST - 2 VIEW  Comparison: 08/02/2012  Findings: Cardiomegaly is again observed.  Prior CABG noted. Continued indistinct right perihilar and basilar airspace opacity along with left retrocardiac airspace opacity.  Blunted costophrenic angles, right greater than left.  IMPRESSION:  1.  Essentially stable appearance of cardiomegaly, presumed edema, and right greater than left pleural effusions.   Original Report Authenticated By: Gaylyn Rong, M.D.     Dg Chest 2 View  08/02/2012  *RADIOLOGY REPORT*  Clinical Data: Chest pain, shortness of breath  CHEST - 2 VIEW  Comparison: 07/25/2012  Findings: Coronary bypass changes noted.  Heart is enlarged with asymmetric airspace disease versus edema.  Enlarging right pleural effusion.  CHF is favored.  No pneumothorax.  IMPRESSION: Slight worsening asymmetric airspace disease versus edema with an enlarging right pleural effusion.  Worsening CHF is favored.   Original Report Authenticated By: Judie Petit. Miles Costain, M.D.    Dg Chest 2 View  07/25/2012  *RADIOLOGY REPORT*  Clinical Data: Chest pain and short of breath  CHEST - 2 VIEW  Comparison: 12/22/2011  Findings: Prior CABG.  Cardiac enlargement with pulmonary vascular congestion and mild edema.  Small pleural effusions bilaterally with mild bibasilar atelectasis.  IMPRESSION: Congestive heart failure with mild edema and small pleural effusions.   Original Report Authenticated By: Camelia Phenes, M.D.    Ct Head Wo Contrast  07/24/2012  *RADIOLOGY REPORT*  Clinical Data: Code stroke.  Slurred speech, left facial droop.  CT HEAD WITHOUT CONTRAST  Technique:  Contiguous axial images were obtained from the base of the skull through the vertex without contrast.  Comparison: 01/23/2009  Findings: There is atrophy and chronic small vessel disease changes. No acute intracranial abnormality.  Specifically, no hemorrhage, hydrocephalus, mass lesion, acute infarction, or significant intracranial injury.  No acute calvarial abnormality. Visualized paranasal sinuses and mastoids clear.  Orbital soft tissues unremarkable.  IMPRESSION: No acute intracranial abnormality.  Atrophy, chronic microvascular disease.   Original Report Authenticated By: Cyndie Chime, M.D.    US Renal  08/04/2012  *RADIOLOGY REPORT*  Clinical Data: Acute on chronic kidney disease  RENAL/URINARY TRACT ULTRASOUND COMPLETE  Comparison:  11/01/2002  Findings:  Right Kidney:  Right kidney is diffusely  echogenic.  It is normal in size with no masses, stones or hydronephrosis.  Right kidney measures 10.8 cm.  Left Kidney:  Left kidney is diffusely echogenic.  It is normal in size with no masses, stones or hydronephrosis.  It measures 10.6 cm.  Bladder:  Unremarkable.  Bilateral pleural effusions are incidentally noted.  IMPRESSION: Diffusely echogenic kidneys consistent with medical renal disease. Kidneys remain normal in size.  No hydronephrosis or renal masses.   Original Report Authenticated By: Amie Portland, M.D.    Nm Pulmonary Perf And Vent  07/26/2012  *RADIOLOGY REPORT*  Clinical Data: Chest pain and shortness of breath for the past week.  NM PULMONARY VENTILATION AND PERFUSION SCAN  Radiopharmaceutical: CURIE MAA TECHNETIUM TO 19M ALBUMIN AGGREGATED and 40 mCi technetium 6m DTPA  Comparison: Chest radiographs obtained yesterday.  Findings: Normal ventilation of both lungs.  Normal perfusion of both lungs.  IMPRESSION: Normal examination.  No evidence of pulmonary embolism.   Original Report Authenticated  By: Darrol Angel, M.D.    Dg Bone Survey Met  08/08/2012  *RADIOLOGY REPORT*  Clinical Data: Positive capital light chain disease.  Rule out lytic multiple myeloma lesions.  METASTATIC BONE SURVEY  Comparison: Chest radiograph, 08/07/2012  Findings: Imaging included and lateral images fall, imaging of the spine, imaging of the chest and pelvis and imaging of the humeri and femurs.  There are no lytic lesions to suggest multiple myeloma.  The chest radiograph shows hazy opacity at the right base with less opacity at the medial left lung base unchanged from the previous day's study.  There are degenerative changes throughout the visualized spine. There is a mild compression fracture of L1 that is likely old. There are degenerative changes with chondrocalcinosis of both knees.  There are degenerative changes of both AC joints and of the left glenohumeral joint.  The bones are diffusely  demineralized.  IMPRESSION: No osteolytic lesions to suggest multiple myeloma.  No evidence of neoplastic disease to bone.   Original Report Authenticated By: Amie Portland, M.D.     ASSESSMENT AND PLAN  Principal Problem:  *Acute on chronic diastolic CHF (congestive heart failure) Active Problems:  DIABETES MELLITUS  HYPERLIPIDEMIA  HYPERTENSION  CORONARY ARTERY DISEASE  Hypoglycemia  Pulmonary arterial hypertension  Acute on chronic renal insufficiency   Diuresing. No change in cardiac plan today.  Signed, Valera Castle MD

## 2012-08-14 LAB — RENAL FUNCTION PANEL
Calcium: 9.6 mg/dL (ref 8.4–10.5)
GFR calc Af Amer: 11 mL/min — ABNORMAL LOW (ref >90)
GFR calc non Af Amer: 10 mL/min — ABNORMAL LOW (ref >90)
Phosphorus: 5.3 mg/dL — ABNORMAL HIGH (ref 2.3–4.6)
Potassium: 3.7 mEq/L (ref 3.5–5.1)
Sodium: 130 mEq/L — ABNORMAL LOW (ref 135–145)

## 2012-08-14 NOTE — Progress Notes (Signed)
Patient ID: [name], male   DOB: 23-Sep-1931, 78 y.o.   MRN: [id number]   [facility] Progress Note    Subjective:   Reports to have had some improvement in shortness of breath over the weekend-urine output notably better. He has several questions regarding dialysis/current renal function that have been answered.    Objective:   BP 115/62  Pulse 69  Temp 98.6 F (37 C) (Oral)  Resp 22  Ht 5\' 11"  (1.803 m)  Wt 91.944 kg (202 lb 11.2 oz)  BMI 28.27 kg/m2  SpO2 92%  Intake/Output Summary (Last 24 hours) at 08/14/12 0839 Last data filed at 08/14/12 0434  Gross per 24 hour  Intake    480 ml  Output   1750 ml  Net  -1270 ml   Weight change: 0 kg (0 lb)  Physical Exam: Gen: Comfortably resting up in bed, he gets dyspneic on speaking sentences CVS: Pulse regular in rate and rhythm, normal S1 with loud S2 snap Resp: Decreased breath sounds over the bases bilaterally, no obvious rales/rhonchi Abd: Soft, obese, nontender and bowel sounds are normal Ext: Trace bipedal edema  Imaging: No results found.  Labs: BMET  Lab 08/14/12 0500 08/13/12 0845 08/12/12 0515 08/11/12 0450 08/10/12 0528 08/09/12 0645 08/08/12 0510  NA 130* 132* 132* 128* 134* 135 133*  K 3.7 3.9 3.5 3.3* 3.5 3.5 3.6  CL 90* 92* 93* 90* 93* 95* 92*  CO2 26 28 27 27 28 28 29   GLUCOSE 84 122* 171* 176* 137* 83 92  BUN 91* 85* 85* 86* 85* 85* 85*  CREATININE 5.10* 4.79* 4.36* 4.65* 4.52* 4.58* 4.26*  ALB -- -- -- -- -- -- --  CALCIUM 9.6 9.6 9.5 9.5 9.7 9.7 9.7  PHOS 5.3* 5.1* 4.8* 4.3 4.9* 4.6 --   CBC  Lab 08/09/12 0645  WBC 4.8  NEUTROABS --  HGB 8.9*  HCT 26.1*  MCV 83.1  PLT 174    Medications:      . amLODipine  10 mg Oral Daily  . aspirin EC  81 mg Oral Daily  . atorvastatin  20 mg Oral q1800  . darbepoetin (ARANESP) injection - NON-DIALYSIS  60 mcg Subcutaneous Q Mon-1800  . ferrous sulfate  325 mg Oral BID WC  . furosemide  160 mg Intravenous Q6H  . heparin   5,000 Units Subcutaneous Q8H  . hydrALAZINE  75 mg Oral Q8H  . insulin aspart  0-15 Units Subcutaneous TID WC  . insulin detemir  15 Units Subcutaneous Daily  . linagliptin  5 mg Oral Daily  . metoprolol succinate  12.5 mg Oral Daily  . sildenafil  40 mg Oral TID  . sodium chloride  3 mL Intravenous Q12H     Assessment/ Plan:   1. AKI/CKD- the patient has continue to diurese slowly after resuming IV lasix and initiation of revatio and dopamine following right heart catheterization. His creatinine has unfortunately continued to worsen. Will cont with IV lasix and follow daily weights and strict I's/O's. I discussed HD with the patient and recommended that he watch his dialysis videos-he unfortunately has not completely made up his mind. If renal function continues to worsen tomorrow, I will ask VVS to evaluate for AVG/AVF and place tunneled catheter for dialysis initiation. 2. CHF- as above, improved with IV lasix/revatio/dopamine, clinically doing better however with acute on chronic cardiorenal syndrome leading to renal insufficiency on CKD. 3. Pulmonary HTN- unclear etiology, no evidence of pulmonary embolism-on intravenous prostaglandin  therapy to help reduce pulmonary artery pressures.  4. Anemia of chronic disease- follow iron stores and on ESA, he is s/p IV iron therapy. No overt losses noted.   Zetta Bills, MD 08/14/2012, 8:39 AM

## 2012-08-14 NOTE — Progress Notes (Signed)
o2 sat at rest 87%

## 2012-08-14 NOTE — Progress Notes (Signed)
Primary cardiologist: Dr. Olga Millers  Subjective:   No chest pain. Remains dyspneic but improved over weekend   Objective:   Temp:  [98.2 F (36.8 C)-98.9 F (37.2 C)] 98.6 F (37 C) (11/18 0432) Pulse Rate:  [69-84] 69  (11/18 0432) Resp:  [20-22] 22  (11/18 0432) BP: (103-117)/(55-67) 115/62 mmHg (11/18 0432) SpO2:  [92 %-95 %] 92 % (11/18 0432) Weight:  [202 lb 11.2 oz (91.944 kg)] 202 lb 11.2 oz (91.944 kg) (11/18 0440) Last BM Date: 08/10/12  Filed Weights   08/12/12 0519 08/13/12 0458 08/14/12 0440  Weight: 207 lb 7.3 oz (94.1 kg) 202 lb 11.2 oz (91.944 kg) 202 lb 11.2 oz (91.944 kg)    Intake/Output Summary (Last 24 hours) at 08/14/12 0701 Last data filed at 08/14/12 0434  Gross per 24 hour  Intake    480 ml  Output   1750 ml  Net  -1270 ml    Exam:  General: No acute distress.  Lungs: Diminished BS bases (improved)  Cardiac: Regular rate and rhythm, accentuated S2. 2/6 systolic murmur  Abdomen: Protuberant, bowel sounds present, nontender.  Extremities: no edema  Lab Results:  Basic Metabolic Panel:  Lab 08/13/12 5409 08/12/12 0515 08/11/12 0450  NA 132* 132* 128*  K 3.9 3.5 3.3*  CL 92* 93* 90*  CO2 28 27 27   GLUCOSE 122* 171* 176*  BUN 85* 85* 86*  CREATININE 4.79* 4.36* 4.65*  CALCIUM 9.6 9.5 9.5  MG -- -- --    Liver Function Tests:  Lab 08/13/12 0845 08/12/12 0515 08/11/12 0450  AST -- -- --  ALT -- -- --  ALKPHOS -- -- --  BILITOT -- -- --  PROT -- -- --  ALBUMIN 3.6 3.3* 3.3*    Medications:   Scheduled Medications:    . amLODipine  10 mg Oral Daily  . aspirin EC  81 mg Oral Daily  . atorvastatin  20 mg Oral q1800  . darbepoetin (ARANESP) injection - NON-DIALYSIS  60 mcg Subcutaneous Q Mon-1800  . ferrous sulfate  325 mg Oral BID WC  . furosemide  160 mg Intravenous Q6H  . heparin  5,000 Units Subcutaneous Q8H  . hydrALAZINE  75 mg Oral Q8H  . insulin aspart  0-15 Units Subcutaneous TID WC  . insulin detemir  15  Units Subcutaneous Daily  . linagliptin  5 mg Oral Daily  . metoprolol succinate  12.5 mg Oral Daily  . sildenafil  40 mg Oral TID  . sodium chloride  3 mL Intravenous Q12H    Infusions:    . DOPamine 2.5 mcg/kg/min (08/14/12 0434)    PRN Medications: acetaminophen, ALPRAZolam, dextrose, hydrALAZINE, ondansetron (ZOFRAN) IV, sodium chloride   Assessment:   1. Pulmonary arterial hypertension, PASP 60 mmHg (mean 38) by right heart catheterization 11/1. Recent VQ scan not suggesting chronic PE, and hemodynamics not clearly consistent with pericardial constriction. ANA and HIV negative. Echocardiographic bubble study was negative for obvious shunt. PFTs show severe restriction and diffusion abnormality. He has been started on sildenafil. Outpatient sleep study.  2. Known multivessel CAD status post CABG with cardiac catheterization in March of this year noted in the H&P.  LVEF 50-55%.  3. Acute on chronic renal insufficiency   4. Hypertension.  5. Hyponatremia - fluid restrict; repeat BMET in AM  Plan/Discussion:    Some improvement over weekend. Nephrology following and adjusting diuretics; may need dialysis; Continue revatio; Will ask Dr Gala Romney to review. Options limited. Note [facility] Friday  showed PCWP 26; PA 66/24 and CO 6.22. Olga Millers, M.D., F.A.C.C. 7:01 AM

## 2012-08-15 DIAGNOSIS — N183 Chronic kidney disease, stage 3 unspecified: Secondary | ICD-10-CM

## 2012-08-15 DIAGNOSIS — Z01810 Encounter for preprocedural cardiovascular examination: Secondary | ICD-10-CM

## 2012-08-15 LAB — GLUCOSE, CAPILLARY
Glucose-Capillary: 109 mg/dL — ABNORMAL HIGH (ref 70–99)
Glucose-Capillary: 84 mg/dL (ref 70–99)
Glucose-Capillary: 154 mg/dL — ABNORMAL HIGH (ref 70–99)

## 2012-08-15 LAB — RENAL FUNCTION PANEL
BUN: 96 mg/dL — ABNORMAL HIGH (ref 6–23)
CO2: 26 mEq/L (ref 19–32)
Chloride: 90 mEq/L — ABNORMAL LOW (ref 96–112)
Creatinine, Ser: 4.71 mg/dL — ABNORMAL HIGH (ref 0.50–1.35)
Glucose, Bld: 117 mg/dL — ABNORMAL HIGH (ref 70–99)

## 2012-08-15 MED ORDER — SORBITOL 70 % SOLN
30.0000 mL | Status: DC | PRN
  Administered 2012-08-15 (×2): 30 mL via ORAL
  Filled 2012-08-15 (×2): qty 30

## 2012-08-15 MED ORDER — ACETAMINOPHEN 325 MG PO TABS: 650.0000 mg | ORAL_TABLET | Freq: Four times a day (QID) | ORAL | Status: DC | PRN

## 2012-08-15 MED ORDER — CAMPHOR-MENTHOL 0.5-0.5 % EX LOTN
1.0000 "application " | TOPICAL_LOTION | Freq: Three times a day (TID) | CUTANEOUS | Status: DC | PRN
  Filled 2012-08-15: qty 222

## 2012-08-15 MED ORDER — HYDRALAZINE HCL 25 MG PO TABS
25.0000 mg | ORAL_TABLET | Freq: Three times a day (TID) | ORAL | Status: DC
  Administered 2012-08-15 – 2012-08-16 (×3): 25 mg via ORAL
  Filled 2012-08-15 (×6): qty 1

## 2012-08-15 MED ORDER — NEPRO/CARBSTEADY PO LIQD
237.0000 mL | Freq: Three times a day (TID) | ORAL | Status: DC | PRN
  Administered 2012-08-15: 237 mL via ORAL
  Filled 2012-08-15: qty 237

## 2012-08-15 MED ORDER — CEFAZOLIN SODIUM 1-5 GM-% IV SOLN
1.0000 g | INTRAVENOUS | Status: DC
  Filled 2012-08-15: qty 50

## 2012-08-15 MED ORDER — ACETAMINOPHEN 650 MG RE SUPP: 650.0000 mg | Freq: Four times a day (QID) | RECTAL | Status: DC | PRN

## 2012-08-15 MED ORDER — ONDANSETRON HCL 4 MG/2ML IJ SOLN: 4.0000 mg | Freq: Four times a day (QID) | INTRAMUSCULAR | Status: DC | PRN

## 2012-08-15 MED ORDER — HYDROXYZINE HCL 25 MG PO TABS
25.0000 mg | ORAL_TABLET | Freq: Three times a day (TID) | ORAL | Status: DC | PRN
  Filled 2012-08-15: qty 1

## 2012-08-15 MED ORDER — CALCIUM CARBONATE 1250 MG/5ML PO SUSP
500.0000 mg | Freq: Four times a day (QID) | ORAL | Status: DC | PRN
Start: 2012-08-15 — End: 2012-08-20
  Filled 2012-08-15: qty 5

## 2012-08-15 MED ORDER — ZOLPIDEM TARTRATE 5 MG PO TABS: 5.0000 mg | ORAL_TABLET | Freq: Every evening | ORAL | Status: DC | PRN

## 2012-08-15 MED ORDER — ONDANSETRON HCL 4 MG PO TABS
4.0000 mg | ORAL_TABLET | Freq: Four times a day (QID) | ORAL | Status: DC | PRN
  Administered 2012-08-15: 4 mg via ORAL
  Filled 2012-08-15: qty 1

## 2012-08-15 MED ORDER — DOCUSATE SODIUM 283 MG RE ENEM
1.0000 | ENEMA | RECTAL | Status: DC | PRN
  Filled 2012-08-15: qty 1

## 2012-08-15 NOTE — Progress Notes (Signed)
Patient ID: LEODIS ALCOCER, male   DOB: 04/15/1931, 78 y.o.   MRN: [id number]   [facility] Progress Note    Subjective:   Complaints of abdominal bloating and constipation that seemingly is worsening his ability to take full breaths at this time. He denies any chest pain.    Objective:   BP 133/55  Pulse 79  Temp 98.7 F (37.1 C) (Oral)  Resp 24  Ht 5\' 11"  (1.803 m)  Wt 91.944 kg (202 lb 11.2 oz)  BMI 28.27 kg/m2  SpO2 94%  Intake/Output Summary (Last 24 hours) at 08/15/12 0825 Last data filed at 08/15/12 0600  Gross per 24 hour  Intake  663.6 ml  Output   1070 ml  Net -406.4 ml   Weight change: 0 kg (0 lb)  Physical Exam: Gen: Appears to be a comfortable resting in bed, gets dyspneic upon speaking CVS: Pulse regular in rate and rhythm, heart sounds S1 and S2 normal Resp: Diminished breath sounds bilaterally with occasional fine rales-no wheeze Abd: Soft, obese, nontender and bowel sounds are normal Ext: Trace lower extremity edema bipedally  Imaging: No results found.  Labs: BMET  Lab 08/15/12 0515 08/14/12 0500 08/13/12 0845 08/12/12 0515 08/11/12 0450 08/10/12 0528 08/09/12 0645  NA 129* 130* 132* 132* 128* 134* 135  K 3.6 3.7 3.9 3.5 3.3* 3.5 3.5  CL 90* 90* 92* 93* 90* 93* 95*  CO2 26 26 28 27 27 28 28   GLUCOSE 117* 84 122* 171* 176* 137* 83  BUN 96* 91* 85* 85* 86* 85* 85*  CREATININE 4.71* 5.10* 4.79* 4.36* 4.65* 4.52* 4.58*  ALB -- -- -- -- -- -- --  CALCIUM 9.3 9.6 9.6 9.5 9.5 9.7 9.7  PHOS 5.1* 5.3* 5.1* 4.8* 4.3 4.9* 4.6   CBC  Lab 08/09/12 0645  WBC 4.8  NEUTROABS --  HGB 8.9*  HCT 26.1*  MCV 83.1  PLT 174    Medications:      . amLODipine  10 mg Oral Daily  . aspirin EC  81 mg Oral Daily  . atorvastatin  20 mg Oral q1800  . darbepoetin (ARANESP) injection - NON-DIALYSIS  60 mcg Subcutaneous Q Mon-1800  . ferrous sulfate  325 mg Oral BID WC  . furosemide  160 mg Intravenous Q6H  . heparin  5,000 Units Subcutaneous  Q8H  . hydrALAZINE  25 mg Oral Q8H  . insulin aspart  0-15 Units Subcutaneous TID WC  . insulin detemir  15 Units Subcutaneous Daily  . linagliptin  5 mg Oral Daily  . metoprolol succinate  12.5 mg Oral Daily  . sildenafil  40 mg Oral TID  . sodium chloride  3 mL Intravenous Q12H  . [DISCONTINUED] hydrALAZINE  75 mg Oral Q8H     Assessment/ Plan:   1. AKI/CKD- renal function continues to show decline with marginal urinary output. Rising BUN noted and may be partly responsible for the patient's current reports of nausea. I have ordered vein mapping and requested VVS to evaluate for AVG/AVF and place tunneled catheter for dialysis initiation. Plan to start dialysis in the next 24 hours. 2. CHF- as above, improved with IV lasix/revatio/dopamine, clinically doing better however with acute on chronic cardiorenal syndrome leading to renal insufficiency on CKD.  3. Pulmonary HTN- unclear etiology, no evidence of pulmonary embolism-on intravenous prostaglandin therapy to help reduce pulmonary artery pressures.  4. Anemia of chronic disease- follow iron stores and on ESA, he is s/p IV iron therapy. No  overt losses noted.  Zetta Bills, MD 08/15/2012, 8:25 AM

## 2012-08-15 NOTE — Consult Note (Signed)
[facility] [place] CONSULT NOTE 08/15/2012 DOB: [id number] MRN : [id number]  CC: ESRD - Now in need of HD Referring Physician: Zetta Bills, MD  History of Present Illness: [name] is a 78 y.o. male with HX CAD , DM, CKD stage 3 who was admitted with CP, SOB and Acute on CKD 3. Pt has been SOB and begun on Diuresis. He also had cardiac workup. Pt Cr continued to climb - Cr 4.71 today We were asked to evaluate for IDC placement and HD access - AVF/AVGG Pt to needs HD in next 24 hours.  Past Medical History  Diagnosis Date  . CAD (coronary artery disease)     S/P CABG in 2006. stable anatomy by cath 12/21/11 with 2/3 patent bypass grafts - for med rx. Not on BB 2/2 second-degree AVB.  . LV dysfunction     Per Myoview in 2008; Ef 46% with prior inferior and apical infarct, no ischemia  . Cerebrovascular disease   . HLD (hyperlipidemia)   . Diastolic dysfunction   . Mediastinal adenopathy     per CT chest in 2006 with PET scan showing very limited metabolic activity; not felt to have a significant neoplastic potential  . Anemia   . Hypertension   . Diabetes mellitus     insulin dependent   . GERD (gastroesophageal reflux disease)   . AV block, Mobitz 1     Noted 11/2011 in hospital, BB stopped  . Colon polyps   . Diverticulosis     Past Surgical History  Procedure Date  . Appendectomy   . Coronary artery bypass graft 2006    SVG to OM2, SVG to LAD, SVG to DX; (the LIMA did not have good flow and therefore was not used)  . Cardiac catheterization 12/21/2011     ROS: [x]  Positive  [ ]  Denies    General: [ ]  Weight loss, [ ]  Fever, [ ]  chills Neurologic: [ ]  Dizziness, [ ]  Blackouts, [ ]  Seizure [x ] Stroke, [ ]  "Mini stroke", [ ]  Slurred speech, [ ]  Temporary blindness; [ ]  weakness in arms or legs, [ ]  Hoarseness Cardiac: [x ] Chest pain/pressure, [x ] Shortness of breath at rest [x ] Shortness of breath with exertion, [ ]  Atrial fibrillation or  irregular heartbeat Vascular: [ ]  Pain in legs with walking, [ ]  Pain in legs at rest, [ ]  Pain in legs at night,  [ ]  Non-healing ulcer, [ ]  Blood clot in vein/DVT,   Pulmonary: [ ]  Home oxygen, [ ]  Productive cough, [ ]  Coughing up blood, [ ]  Asthma,  [ ]  Wheezing Musculoskeletal:  [ ]  Arthritis, [ ]  Low back pain, [ ]  Joint pain Hematologic: [ ]  Easy Bruising, [ ]  Anemia; [ ]  Hepatitis Gastrointestinal: [ ]  Blood in stool, [x ] Gastroesophageal Reflux/heartburn, [ ]  Trouble swallowing Urinary: [ x] chronic Kidney disease, [ ]  on HD - [ ]  MWF or [ ]  TTHS, [ ]  Burning with urination, [ ]  Difficulty urinating Skin: [ ]  Rashes, [ ]  Wounds Psychological: [ ]  Anxiety, [ ]  Depression  Social History History  Substance Use Topics  . Smoking status: Former Smoker    Quit date: 09/27/1953  . Smokeless tobacco: Never Used  . Alcohol Use: No    Family History Family History  Problem Relation Age of Onset  . Stomach cancer Sister   . Pancreatic cancer Brother   . Lung cancer Brother   . Colon cancer Neg  Hx     No Known Allergies  Current Facility-Administered Medications  Medication Dose Route Frequency Provider Last Rate Last Dose  . acetaminophen (TYLENOL) tablet 650 mg  650 mg Oral Q6H PRN Vonna Kotyk K. Allena Katz, MD       Or  . acetaminophen (TYLENOL) suppository 650 mg  650 mg Rectal Q6H PRN Hartley Barefoot. Allena Katz, MD      . ALPRAZolam Prudy Feeler) tablet 0.25 mg  0.25 mg Oral BID PRN Joline Salt Barrett, PA   0.25 mg at 08/04/12 1047  . amLODipine (NORVASC) tablet 10 mg  10 mg Oral Daily Laurey Morale, MD   10 mg at 08/15/12 0959  . aspirin EC tablet 81 mg  81 mg Oral Daily Lewayne Bunting, MD   81 mg at 08/15/12 0959  . atorvastatin (LIPITOR) tablet 20 mg  20 mg Oral q1800 Lewayne Bunting, MD   20 mg at 08/14/12 1853  . calcium carbonate (dosed in mg elemental calcium) suspension 500 mg of elemental calcium  500 mg of elemental calcium Oral Q6H PRN Vonna Kotyk K. Allena Katz, MD      . camphor-menthol [facility]) lotion  1 application  1 application Topical Q8H PRN Hartley Barefoot. Allena Katz, MD       And  . hydrOXYzine (ATARAX/VISTARIL) tablet 25 mg  25 mg Oral Q8H PRN Vonna Kotyk K. Allena Katz, MD      . darbepoetin [facility]) injection 60 mcg  60 mcg Subcutaneous Q Mon-1800 Irena Cords, MD   60 mcg at 08/14/12 2037  . dextrose (GLUTOSE) 40 % oral gel 37.5 g  1 Tube Oral PRN Lewayne Bunting, MD      . docusate sodium [facility]) enema 283 mg  1 enema Rectal PRN Hartley Barefoot. Allena Katz, MD      . feeding supplement (NEPRO CARB STEADY) liquid 237 mL  237 mL Oral TID PRN Hartley Barefoot. Allena Katz, MD      . ferrous sulfate tablet 325 mg  325 mg Oral BID WC Garnetta Buddy, MD   325 mg at 08/15/12 0739  . furosemide (LASIX) 160 mg in dextrose 5 % 50 mL IVPB  160 mg Intravenous Q6H Lewayne Bunting, MD   160 mg at 08/15/12 0740  . heparin injection 5,000 Units  5,000 Units Subcutaneous Q8H Laurey Morale, MD   5,000 Units at 08/14/12 1500  . hydrALAZINE (APRESOLINE) injection 10 mg  10 mg Intravenous Q6H PRN Ok Anis, NP   10 mg at 07/25/12 0704  . hydrALAZINE (APRESOLINE) tablet 25 mg  25 mg Oral Q8H Lewayne Bunting, MD      . insulin aspart (novoLOG) injection 0-15 Units  0-15 Units Subcutaneous TID WC Joline Salt Barrett, PA   2 Units at 08/13/12 1131  . insulin detemir (LEVEMIR) injection 15 Units  15 Units Subcutaneous Daily Gery Pray, PA-C   15 Units at 08/14/12 2133  . linagliptin (TRADJENTA) tablet 5 mg  5 mg Oral Daily Jacques Navy, MD   5 mg at 08/15/12 0959  . metoprolol succinate (TOPROL-XL) 24 hr tablet 12.5 mg  12.5 mg Oral Daily Cassell Clement, MD   12.5 mg at 08/15/12 0959  . ondansetron (ZOFRAN) injection 4 mg  4 mg Intravenous Q6H PRN Laurey Morale, MD   4 mg at 08/12/12 2235  . ondansetron (ZOFRAN) tablet 4 mg  4 mg Oral Q6H PRN Vonna Kotyk K. Allena Katz, MD       Or  . ondansetron [facility]) injection 4  mg  4 mg Intravenous Q6H PRN Vonna Kotyk K. Allena Katz, MD      . sildenafil (REVATIO) tablet 40 mg  40 mg Oral TID Lewayne Bunting, MD   40 mg  at 08/15/12 0959  . sodium chloride 0.9 % injection 3 mL  3 mL Intravenous Q12H Laurey Morale, MD   3 mL at 08/15/12 1001  . sodium chloride 0.9 % injection 3 mL  3 mL Intravenous PRN Laurey Morale, MD      . sorbitol 70 % solution 30 mL  30 mL Oral PRN Vonna Kotyk K. Allena Katz, MD   30 mL at 08/15/12 1043  . zolpidem (AMBIEN) tablet 5 mg  5 mg Oral QHS PRN Hartley Barefoot. Allena Katz, MD      . [DISCONTINUED] acetaminophen (TYLENOL) tablet 650 mg  650 mg Oral Q4H PRN Laurey Morale, MD   650 mg at 08/04/12 1047  . [DISCONTINUED] DOPamine (INTROPIN) 800 mg in dextrose 5 % 250 mL infusion  2.5 mcg/kg/min Intravenous Titrated Lewayne Bunting, MD 4.3 mL/hr at 08/14/12 0434 2.5 mcg/kg/min at 08/14/12 0434  . [DISCONTINUED] hydrALAZINE (APRESOLINE) tablet 75 mg  75 mg Oral Q8H Lewayne Bunting, MD   75 mg at 08/15/12 0610     Imaging: No results found.  Significant Diagnostic Studies: CBC Lab Results  Component Value Date   WBC 4.8 08/09/2012   HGB 8.9* 08/09/2012   HCT 26.1* 08/09/2012   MCV 83.1 08/09/2012   PLT 174 08/09/2012    BMET    Component Value Date/Time   NA 129* 08/15/2012 0515   K 3.6 08/15/2012 0515   CL 90* 08/15/2012 0515   CO2 26 08/15/2012 0515   GLUCOSE 117* 08/15/2012 0515   BUN 96* 08/15/2012 0515   CREATININE 4.71* 08/15/2012 0515   CREATININE 1.89* 12/20/2011 1233   CALCIUM 9.3 08/15/2012 0515   GFRNONAA 11* 08/15/2012 0515   GFRAA 12* 08/15/2012 0515    COAG Lab Results  Component Value Date   INR 1.14 07/28/2012   INR 1.16 12/20/2011   No results found for this basename: PTT     Physical Examination BP Readings from Last 3 Encounters:  08/15/12 124/62  08/15/12 124/62  08/15/12 124/62   Temp Readings from Last 3 Encounters:  08/15/12 97 F (36.1 C) Oral  08/15/12 97 F (36.1 C) Oral  08/15/12 97 F (36.1 C) Oral   SpO2 Readings from Last 3 Encounters:  08/15/12 97%  08/15/12 97%  08/15/12 97%   Pulse Readings from Last 3 Encounters:  08/15/12 65    08/15/12 65  08/15/12 65    General:  WDWN in NAD HENT: WNL Pulmonary: normal non-labored breathing , without Rales, rhonchi,  wheezing Cardiac: RRR, without  Murmurs, rubs or gallops; Abdomen: soft, NT, no masses Skin: no rashes, ulcers noted Vascular Exam/Pulses: 2+ radial  Extremities without ischemic changes, no Gangrene , no cellulitis; no open wounds;  Musculoskeletal: no muscle wasting or atrophy  Neurologic: A&O X 3; Appropriate Affect ;  SENSATION: normal; MOTOR FUNCTION: Pt has good and equal strength in BUE - 5/5 strength Speech is fluent/normal  Non-Invasive Vascular Imaging: Pending  ASSESSMENT/PLAN: [name] is a 78 y.o. male with worsening renal function - needs IDC and AVF/AVGG Will try to get VM done today  Per Dr. Ezekiel Ina note - would like to dialyze in next 24 hours Pt deciding if he wants fistula/Graft  History and exam details as above.  Vein map shows reasonable  right upper arm cephalic vein.  The patient seems somewhat confused about how his kidney failure is related to his shortness of breath.  He wishes to talk to the nephrology service further for clarification.  Will make NPO for possible catheter and fistula tomorrow.  Procedure risks benefits discussed.  Fabienne Bruns, MD [facility] [place] Office: [phone] Pager: [phone]

## 2012-08-15 NOTE — Progress Notes (Signed)
Pt complaint of feeling nauseated and constipated  today, did not eat breakfast or lunch only taking liquids, medicated prn with zofran, bowel sounds presen abd soft nontender  Last BM 11/17.  Given prn med for constipation will continue to follow closely Georgette Dover

## 2012-08-15 NOTE — Progress Notes (Signed)
Hypoglycemic Event  CBG: 63  Treatment: 15 GM carbohydrate snack  Symptoms: None  Follow-up CBG: Time:1203 CBG Result:84  Possible Reasons for Event: Inadequate meal intake  Comments/MD notified:    [name] A  Remember to initiate Hypoglycemia Order Set & complete

## 2012-08-15 NOTE — Progress Notes (Signed)
VASCULAR LAB PRELIMINARY  PRELIMINARY  PRELIMINARY  PRELIMINARY  Right  Upper Extremity Vein Map    Cephalic  Segment Diameter  Comment  1. Axilla mm  Not visualized  2. Mid upper arm mm  Not visualized  3. Above AC mm  Not visualized  4. In AC mm  Not visualized  5. Below AC mm  Notvisualized  6. Mid forearm 3.75mm  thrombosed  7. Wrist 3.51mm                    Basilic  Segment Diameter Depth Comment       2. Mid to distal upper arm 4.76mm 10.57mm Enters brachial  3. Above AC 3.27mm 7.79mm   4. In [facility] 3.57mm 5.15mm branch  5. Below AC 3.53mm 4.76mm Multiple branches  6. Mid forearm 1.19mm 1.17mm   7. Wrist 1.35mm 1.44mm                     Left Upper Extremity Vein Map    Cephalic  Segment Diameter  Comment  1. Axilla 3.102mm    2. Mid upper arm 3.100mm    3. Above AC 3.85mm    4. In [facility] 4.87mm    5. Below AC 4.55mm    6. Mid forearm 1.56mm    7. Wrist 1.27mm                    Basilic  Segment Diameter Depth Comment       2. Mid upper arm 4.39mm 11.31mm Enters brachial  3. Above [facility] 3.82mm 6.46mm   4. In [facility] 3.61mm 6.52mm branch  5. Below AC 3.13mm 9.38mm branch  6. Mid forearm mm mm Unable to follow distally due to multiple branches  7. Wrist mm mm Unable to follow                   [name], RVS 08/15/2012, 5:02 PM    [name], 08/15/2012, 4:55 PM

## 2012-08-15 NOTE — Plan of Care (Signed)
Problem: Diagnosis - Type of Surgery Goal: General Surgical Patient Education (See Patient Education module for education specifics) Outcome: Progressing Vascular surgeon has educated patient. Patient still wishes to speak with nephrology MD

## 2012-08-15 NOTE — Progress Notes (Signed)
Reviewed serum CBG values - has had a couple of low readings but otherwise CBGs in stable range.  No change in regimen at this time.

## 2012-08-15 NOTE — Progress Notes (Signed)
Primary cardiologist: Dr. Olga Millers  Subjective:   No chest pain. Dyspnea improving   Objective:   Temp:  [97.9 F (36.6 C)-98.6 F (37 C)] 97.9 F (36.6 C) (11/18 1959) Pulse Rate:  [72-76] 73  (11/18 1959) Resp:  [18-22] 18  (11/18 1959) BP: (115-134)/(56-70) 126/56 mmHg (11/18 1959) SpO2:  [93 %-96 %] 96 % (11/18 1959) Last BM Date: 08/13/12  Filed Weights   08/12/12 0519 08/13/12 0458 08/14/12 0440  Weight: 207 lb 7.3 oz (94.1 kg) 202 lb 11.2 oz (91.944 kg) 202 lb 11.2 oz (91.944 kg)    Intake/Output Summary (Last 24 hours) at 08/15/12 0603 Last data filed at 08/15/12 0400  Gross per 24 hour  Intake    480 ml  Output   1070 ml  Net   -590 ml    Exam:  General: No acute distress.  Lungs: Diminished BS bases   Cardiac: Regular rate and rhythm, accentuated S2. 2/6 systolic murmur  Abdomen: Protuberant, bowel sounds present, nontender.  Extremities: trace edema  Lab Results:  Basic Metabolic Panel:  Lab 08/14/12 9604 08/13/12 0845 08/12/12 0515  NA 130* 132* 132*  K 3.7 3.9 3.5  CL 90* 92* 93*  CO2 26 28 27   GLUCOSE 84 122* 171*  BUN 91* 85* 85*  CREATININE 5.10* 4.79* 4.36*  CALCIUM 9.6 9.6 9.5  MG -- -- --    Liver Function Tests:  Lab 08/14/12 0500 08/13/12 0845 08/12/12 0515  AST -- -- --  ALT -- -- --  ALKPHOS -- -- --  BILITOT -- -- --  PROT -- -- --  ALBUMIN 3.5 3.6 3.3*    Medications:   Scheduled Medications:    . amLODipine  10 mg Oral Daily  . aspirin EC  81 mg Oral Daily  . atorvastatin  20 mg Oral q1800  . darbepoetin (ARANESP) injection - NON-DIALYSIS  60 mcg Subcutaneous Q Mon-1800  . ferrous sulfate  325 mg Oral BID WC  . furosemide  160 mg Intravenous Q6H  . heparin  5,000 Units Subcutaneous Q8H  . hydrALAZINE  75 mg Oral Q8H  . insulin aspart  0-15 Units Subcutaneous TID WC  . insulin detemir  15 Units Subcutaneous Daily  . linagliptin  5 mg Oral Daily  . metoprolol succinate  12.5 mg Oral Daily  .  sildenafil  40 mg Oral TID  . sodium chloride  3 mL Intravenous Q12H    Infusions:    . DOPamine 2.5 mcg/kg/min (08/14/12 0434)    PRN Medications: acetaminophen, ALPRAZolam, dextrose, hydrALAZINE, ondansetron (ZOFRAN) IV, sodium chloride   Assessment:   1. Pulmonary arterial hypertension, PASP 60 mmHg (mean 38) by right heart catheterization 11/1. Repeat right heart cath 11/15 showed PA pressure 66/24, PCWP 26 and CO 6.2. Recent VQ scan not suggesting chronic PE, and hemodynamics not clearly consistent with pericardial constriction. ANA and HIV negative. Echocardiographic bubble study was negative for obvious shunt. PFTs show severe restriction and diffusion abnormality. He has been started on sildenafil. Outpatient sleep study.  2. Known multivessel CAD status post CABG with cardiac catheterization in March of this year noted in the H&P.  LVEF 50-55%.  3. Acute on chronic renal insufficiency - cardiorenal syndrome  4. Hypertension.  5. Hyponatremia   Plan/Discussion:    Some improvement in symptoms with diuresis; however, renal function continues to deteriorate. Appears he will need dialysis (nephrology following). DC dopamine; decrease hydralazine; he will most likely require less antihypertensives once dialysis initiated. Arlys John  [name], M.D., F.A.C.C. 6:03 AM

## 2012-08-16 ENCOUNTER — Encounter (HOSPITAL_COMMUNITY)
Admission: AD | Disposition: A | Discharge status: Home / Self Care | Payer: Self-pay | Source: Ambulatory Visit | Attending: Cardiology

## 2012-08-16 LAB — RENAL FUNCTION PANEL
BUN: 95 mg/dL — ABNORMAL HIGH (ref 6–23)
CO2: 25 mEq/L (ref 19–32)
GFR calc Af Amer: 13 mL/min — ABNORMAL LOW (ref >90)
Glucose, Bld: 112 mg/dL — ABNORMAL HIGH (ref 70–99)
Phosphorus: 4.8 mg/dL — ABNORMAL HIGH (ref 2.3–4.6)
Potassium: 3.5 mEq/L (ref 3.5–5.1)
Sodium: 132 mEq/L — ABNORMAL LOW (ref 135–145)

## 2012-08-16 LAB — SURGICAL PCR SCREEN
MRSA, PCR: NEGATIVE
Staphylococcus aureus: NEGATIVE

## 2012-08-16 LAB — CBC
HCT: 27.7 % — ABNORMAL LOW (ref 39.0–52.0)
Hemoglobin: 9 g/dL — ABNORMAL LOW (ref 13.0–17.0)
MCHC: 32.5 g/dL (ref 30.0–36.0)
RBC: 3.23 MIL/uL — ABNORMAL LOW (ref 4.22–5.81)
WBC: 4.8 10*3/uL (ref 4.0–10.5)

## 2012-08-16 LAB — GLUCOSE, CAPILLARY
Glucose-Capillary: 92 mg/dL (ref 70–99)
Glucose-Capillary: 174 mg/dL — ABNORMAL HIGH (ref 70–99)

## 2012-08-16 SURGERY — INSERTION OF DIALYSIS CATHETER
Anesthesia: Monitor Anesthesia Care

## 2012-08-16 MED ORDER — CEFAZOLIN SODIUM-DEXTROSE 2-3 GM-% IV SOLR
2.0000 g | INTRAVENOUS | Status: AC
  Filled 2012-08-16: qty 50

## 2012-08-16 MED ORDER — FUROSEMIDE 80 MG PO TABS
240.0000 mg | ORAL_TABLET | Freq: Three times a day (TID) | ORAL | Status: DC
  Administered 2012-08-16 – 2012-08-20 (×13): 240 mg via ORAL
  Filled 2012-08-16 (×15): qty 3

## 2012-08-16 NOTE — Progress Notes (Signed)
Pt reports feeling much better today, appetite improved, ambulated 3 times in hallway Egbert Garibaldi A

## 2012-08-16 NOTE — Progress Notes (Signed)
Inpatient Diabetes Program Recommendations  AACE/ADA: New Consensus Statement on Inpatient Glycemic Control (2013)  Target Ranges:  Prepandial:   less than 140 mg/dL      Peak postprandial:   less than 180 mg/dL (1-2 hours)      Critically ill patients:  140 - 180 mg/dL   Reason for Visit: CBG's low. Results for [name], [name] (MRN [id number]) as of 08/16/2012 14:07  Ref. Range 08/15/2012 16:59 08/15/2012 21:18 08/16/2012 06:46 08/16/2012 11:22  Glucose-Capillary Latest Range: 70-99 mg/dL 84 132 (H) 92 90   Please decrease Levemir 5 units daily.  Also please decrease Novolog correction to sensitive.

## 2012-08-16 NOTE — Progress Notes (Signed)
Patient ID: [name], male   DOB: 12/27/1930, 78 y.o.   MRN: [id number]   [facility] Progress Note    Subjective:   Refused to sign consent and have dialysis access (permanent/temporary placed). He remains unsure as to whether he would want to pursue dialysis and states that he'll make a decision "in the next couple days" after speaking to a close friend who is on dialysis.    Objective:   BP 111/56  Pulse 69  Temp 98.2 F (36.8 C) (Oral)  Resp 18  Ht 5\' 11"  (1.803 m)  Wt 91.9 kg (202 lb 9.6 oz)  BMI 28.26 kg/m2  SpO2 99%  Intake/Output Summary (Last 24 hours) at 08/16/12 4098 Last data filed at 08/16/12 0600  Gross per 24 hour  Intake    264 ml  Output   1050 ml  Net   -786 ml   Weight change: -0.044 kg (-1.6 oz)  Physical Exam: Gen: Comfortably resting in bed, short of breath when speaks prolonged sentences CVS: Pulse regular in rate and rhythm, heart sounds S1 with prominent S2 Resp: Fine rales right base otherwise clear to auscultation Abd: Soft, protuberant, without organomegaly and bowel sounds are normal Ext: Trace pretibial edema  Imaging: No results found.  Labs: BMET  Lab 08/16/12 0430 08/15/12 0515 08/14/12 0500 08/13/12 0845 08/12/12 0515 08/11/12 0450 08/10/12 0528  NA 132* 129* 130* 132* 132* 128* 134*  K 3.5 3.6 3.7 3.9 3.5 3.3* 3.5  CL 92* 90* 90* 92* 93* 90* 93*  CO2 25 26 26 28 27 27 28   GLUCOSE 112* 117* 84 122* 171* 176* 137*  BUN 95* 96* 91* 85* 85* 86* 85*  CREATININE 4.62* 4.71* 5.10* 4.79* 4.36* 4.65* 4.52*  ALB -- -- -- -- -- -- --  CALCIUM 9.4 9.3 9.6 9.6 9.5 9.5 9.7  PHOS 4.8* 5.1* 5.3* 5.1* 4.8* 4.3 4.9*   CBC  Lab 08/16/12 0430  WBC 4.8  NEUTROABS --  HGB 9.0*  HCT 27.7*  MCV 85.8  PLT 155    Medications:      . amLODipine  10 mg Oral Daily  . aspirin EC  81 mg Oral Daily  . atorvastatin  20 mg Oral q1800  .  ceFAZolin (ANCEF) IV  2 g Intravenous On Call to OR  . darbepoetin (ARANESP)  injection - NON-DIALYSIS  60 mcg Subcutaneous Q Mon-1800  . ferrous sulfate  325 mg Oral BID WC  . furosemide  160 mg Intravenous Q6H  . heparin  5,000 Units Subcutaneous Q8H  . insulin aspart  0-15 Units Subcutaneous TID WC  . insulin detemir  15 Units Subcutaneous Daily  . linagliptin  5 mg Oral Daily  . sildenafil  40 mg Oral TID  . sodium chloride  3 mL Intravenous Q12H  . [DISCONTINUED]  ceFAZolin (ANCEF) IV  1 g Intravenous On Call  . [DISCONTINUED] hydrALAZINE  25 mg Oral Q8H  . [DISCONTINUED] metoprolol succinate  12.5 mg Oral Daily     Assessment/ Plan:   1. AKI/CKD- appears to have a fixed urine output of 1 L to 1.5 L on current doses of Lasix. We'll attempt conversion to oral Lasix today. He remains very unsure as to whether he wants to pursue renal replacement therapy and wants to speak to a close family friend who is on dialysis so that he can get a better understanding of what the quality of life will be on this therapy. Was scheduled for access  placement today which has been now canceled. No acute dialysis needs recognized and renal function probably a little better. If he chooses not to do dialysis, palliative care/home hospice may be helpful. 2. CHF- as above, improved with IV lasix/revatio/dopamine, clinically doing better however with acute on chronic cardiorenal syndrome leading to renal insufficiency on CKD. Convert to oral furosemide. 3. Pulmonary HTN- unclear etiology, no evidence of pulmonary embolism-on intravenous prostaglandin therapy to help reduce pulmonary artery pressures.  4. Anemia of chronic disease- follow iron stores and on ESA, he is s/p IV iron therapy. No overt losses noted.   Zetta Bills, MD 08/16/2012, 9:21 AM

## 2012-08-16 NOTE — Progress Notes (Signed)
Pt has refused bed alarm this am, fall risk score high, reinforced with patient fall precautions, calling staff prior to getting oob alone [name]

## 2012-08-16 NOTE — Progress Notes (Signed)
[facility] [place]  Daily Progress Note  Assessment/Planning: CKD Stage IV-V   Pt reluctant to proceed.  I suspected partially due to lack of education.  He is not even certain he wants to proceed with hemodialysis.  Suspect we will need to reschedule once he is ready proceed.  Subjective    Somewhat confused  Objective Filed Vitals:   08/15/12 1345 08/15/12 1700 08/15/12 2006 08/16/12 0528  BP: 108/48 135/69 98/62 111/56  Pulse: 74  77 69  Temp: 97.9 F (36.6 C)  97.7 F (36.5 C) 98.2 F (36.8 C)  TempSrc: Oral  Oral Oral  Resp: 20  18 18   Height:      Weight:    202 lb 9.6 oz (91.9 kg)  SpO2: 94%  95% 99%    Intake/Output Summary (Last 24 hours) at 08/16/12 0808 Last data filed at 08/16/12 0600  Gross per 24 hour  Intake    264 ml  Output   1450 ml  Net  -1186 ml   PULM  Faint rales B CV  RRR GI  soft, NTND  Laboratory CBC    Component Value Date/Time   WBC 4.8 08/16/2012 0430   HGB 9.0* 08/16/2012 0430   HCT 27.7* 08/16/2012 0430   PLT 155 08/16/2012 0430    BMET    Component Value Date/Time   NA 132* 08/16/2012 0430   K 3.5 08/16/2012 0430   CL 92* 08/16/2012 0430   CO2 25 08/16/2012 0430   GLUCOSE 112* 08/16/2012 0430   BUN 95* 08/16/2012 0430   CREATININE 4.62* 08/16/2012 0430   CREATININE 1.89* 12/20/2011 1233   CALCIUM 9.4 08/16/2012 0430   GFRNONAA 11* 08/16/2012 0430   GFRAA 13* 08/16/2012 0430    Leonides Sake, MD [facility] [place] Office: [phone] Pager: [phone]  08/16/2012, 8:08 AM

## 2012-08-16 NOTE — Progress Notes (Signed)
Primary cardiologist: Dr. Olga Millers  Subjective:   No chest pain. Dyspnea continues to improve   Objective:   Temp:  [97 F (36.1 C)-98.2 F (36.8 C)] 98.2 F (36.8 C) (11/20 0528) Pulse Rate:  [65-77] 69  (11/20 0528) Resp:  [18-20] 18  (11/20 0528) BP: (98-135)/(48-69) 111/56 mmHg (11/20 0528) SpO2:  [88 %-99 %] 99 % (11/20 0528) Weight:  [202 lb 9.6 oz (91.9 kg)] 202 lb 9.6 oz (91.9 kg) (11/20 0528) Last BM Date: 08/15/12  Filed Weights   08/14/12 0440 08/15/12 0523 08/16/12 0528  Weight: 202 lb 11.2 oz (91.944 kg) 202 lb 11.2 oz (91.944 kg) 202 lb 9.6 oz (91.9 kg)    Intake/Output Summary (Last 24 hours) at 08/16/12 0640 Last data filed at 08/16/12 0600  Gross per 24 hour  Intake    264 ml  Output   1450 ml  Net  -1186 ml    Exam:  General: No acute distress.  Lungs: Diminished BS bases   Cardiac: Regular rate and rhythm, accentuated S2. 2/6 systolic murmur  Abdomen: Protuberant, bowel sounds present, nontender.  Extremities: no edema  Lab Results:  Basic Metabolic Panel:  Lab 08/15/12 9604 08/14/12 0500 08/13/12 0845  NA 129* 130* 132*  K 3.6 3.7 3.9  CL 90* 90* 92*  CO2 26 26 28   GLUCOSE 117* 84 122*  BUN 96* 91* 85*  CREATININE 4.71* 5.10* 4.79*  CALCIUM 9.3 9.6 9.6  MG -- -- --    Liver Function Tests:  Lab 08/15/12 0515 08/14/12 0500 08/13/12 0845  AST -- -- --  ALT -- -- --  ALKPHOS -- -- --  BILITOT -- -- --  PROT -- -- --  ALBUMIN 3.3* 3.5 3.6    Medications:   Scheduled Medications:    . amLODipine  10 mg Oral Daily  . aspirin EC  81 mg Oral Daily  . atorvastatin  20 mg Oral q1800  .  ceFAZolin (ANCEF) IV  1 g Intravenous On Call  . darbepoetin (ARANESP) injection - NON-DIALYSIS  60 mcg Subcutaneous Q Mon-1800  . ferrous sulfate  325 mg Oral BID WC  . furosemide  160 mg Intravenous Q6H  . heparin  5,000 Units Subcutaneous Q8H  . hydrALAZINE  25 mg Oral Q8H  . insulin aspart  0-15 Units Subcutaneous TID WC  .  insulin detemir  15 Units Subcutaneous Daily  . linagliptin  5 mg Oral Daily  . metoprolol succinate  12.5 mg Oral Daily  . sildenafil  40 mg Oral TID  . sodium chloride  3 mL Intravenous Q12H    Infusions:    PRN Medications: acetaminophen, acetaminophen, ALPRAZolam, calcium carbonate (dosed in mg elemental calcium), camphor-menthol, dextrose, docusate sodium, feeding supplement (NEPRO CARB STEADY), hydrALAZINE, hydrOXYzine, ondansetron (ZOFRAN) IV, ondansetron (ZOFRAN) IV, ondansetron, sodium chloride, sorbitol, zolpidem, [DISCONTINUED] acetaminophen   Assessment:   1. Pulmonary arterial hypertension, PASP 60 mmHg (mean 38) by right heart catheterization 11/1. Repeat right heart cath 11/15 showed PA pressure 66/24, PCWP 26 and CO 6.2. Recent VQ scan not suggesting chronic PE, and hemodynamics not clearly consistent with pericardial constriction. ANA and HIV negative. Echocardiographic bubble study was negative for obvious shunt. PFTs show severe restriction and diffusion abnormality. He has been started on sildenafil. Outpatient sleep study.  2. Known multivessel CAD status post CABG with cardiac catheterization in March of this year noted in the H&P.  LVEF 50-55%.  3. Acute on chronic renal insufficiency - cardiorenal syndrome  4. Hypertension.  5. Hyponatremia   Plan/Discussion:    Some improvement in symptoms with diuresis; however, renal function continues to deteriorate. Nephrology following and plans to start dialysis. DC hydralazine and metoprolol. Olga Millers, M.D., F.A.C.C. 6:40 AM

## 2012-08-17 LAB — RENAL FUNCTION PANEL
CO2: 28 mEq/L (ref 19–32)
Calcium: 9.2 mg/dL (ref 8.4–10.5)
Creatinine, Ser: 4.66 mg/dL — ABNORMAL HIGH (ref 0.50–1.35)
GFR calc Af Amer: 13 mL/min — ABNORMAL LOW (ref >90)
Glucose, Bld: 74 mg/dL (ref 70–99)
Phosphorus: 4.6 mg/dL (ref 2.3–4.6)
Sodium: 134 mEq/L — ABNORMAL LOW (ref 135–145)

## 2012-08-17 LAB — GLUCOSE, CAPILLARY
Glucose-Capillary: 75 mg/dL (ref 70–99)
Glucose-Capillary: 140 mg/dL — ABNORMAL HIGH (ref 70–99)
Glucose-Capillary: 119 mg/dL — ABNORMAL HIGH (ref 70–99)

## 2012-08-17 LAB — MAGNESIUM: Magnesium: 2.6 mg/dL — ABNORMAL HIGH (ref 1.5–2.5)

## 2012-08-17 MED ORDER — SILDENAFIL CITRATE 20 MG PO TABS
60.0000 mg | ORAL_TABLET | Freq: Three times a day (TID) | ORAL | Status: DC
  Administered 2012-08-17 – 2012-08-20 (×11): 60 mg via ORAL
  Filled 2012-08-17 (×13): qty 3

## 2012-08-17 NOTE — Progress Notes (Signed)
Patient ID: [name], male   DOB: 08-Oct-1930, 78 y.o.   MRN: [id number]    Primary cardiologist: Dr. Olga Cooke  Subjective:   No chest pain. Dyspnea continues to improve   Objective:   BP 110/58  Pulse 68  Temp 98 F (36.7 C) (Oral)  Resp 18  Ht 5\' 9"  (1.753 m)  Wt 195 lb 12.3 oz (88.8 kg)  BMI 28.91 kg/m2  SpO2 93% UOP 1175    Exam:  General: No acute distress.  Lungs: Diminished BS bases   Neck: JVP 10 cm  Cardiac: Regular rate and rhythm, accentuated S2. 2/6 systolic murmur  Abdomen: Protuberant, bowel sounds present, nontender.  Extremities: no edema  Lab Results:  Basic Metabolic Panel:  Lab 08/15/12 4098 08/14/12 0500 08/13/12 0845  NA 129* 130* 132*  K 3.6 3.7 3.9  CL 90* 90* 92*  CO2 26 26 28   GLUCOSE 117* 84 122*  BUN 96* 91* 85*  CREATININE 4.71* 5.10* 4.79*  CALCIUM 9.3 9.6 9.6  MG -- -- --    Liver Function Tests:  Lab 08/15/12 0515 08/14/12 0500 08/13/12 0845  AST -- -- --  ALT -- -- --  ALKPHOS -- -- --  BILITOT -- -- --  PROT -- -- --  ALBUMIN 3.3* 3.5 3.6    Medications:   Scheduled Medications:    . amLODipine  10 mg Oral Daily  . aspirin EC  81 mg Oral Daily  . atorvastatin  20 mg Oral q1800  .  ceFAZolin (ANCEF) IV  1 g Intravenous On Call  . darbepoetin (ARANESP) injection - NON-DIALYSIS  60 mcg Subcutaneous Q Mon-1800  . ferrous sulfate  325 mg Oral BID WC  . furosemide  160 mg Intravenous Q6H  . heparin  5,000 Units Subcutaneous Q8H  . hydrALAZINE  25 mg Oral Q8H  . insulin aspart  0-15 Units Subcutaneous TID WC  . insulin detemir  15 Units Subcutaneous Daily  . linagliptin  5 mg Oral Daily  . metoprolol succinate  12.5 mg Oral Daily  . sildenafil  40 mg Oral TID  . sodium chloride  3 mL Intravenous Q12H    Infusions:    PRN Medications: acetaminophen, acetaminophen, ALPRAZolam, calcium carbonate (dosed in mg elemental calcium), camphor-menthol, dextrose, docusate sodium, feeding supplement  (NEPRO CARB STEADY), hydrALAZINE, hydrOXYzine, ondansetron (ZOFRAN) IV, ondansetron (ZOFRAN) IV, ondansetron, sodium chloride, sorbitol, zolpidem, [DISCONTINUED] acetaminophen   Assessment:   1. Pulmonary arterial hypertension, PASP 60 mmHg (mean 38) by right heart catheterization 11/1. Repeat right heart cath 11/15 showed PA pressure 66/24, PCWP 26 and CO 6.2. Recent VQ scan not suggesting chronic PE, and hemodynamics not clearly consistent with pericardial constriction. ANA and HIV negative. Echocardiographic bubble study was negative for obvious shunt. PFTs show severe restriction and diffusion abnormality. He has been started on sildenafil. Outpatient sleep study.  2. Known multivessel CAD status post CABG with cardiac catheterization in March of this year noted in the H&P.  LVEF 50-55%.  3. Acute on chronic renal insufficiency - cardiorenal syndrome  4. Hypertension.  5. Hyponatremia   Plan/Discussion:    Mr [name] is still thinking about whether or not he would want to do dialysis.  Waiting to talk to a friend today who is on HD.   - If willing to consider HD, will need vascular access.  He does not urgently need HD but likely will in the near future to control volume.  Able to obtain UOP around 1000-1400 cc  daily with high dose Lasix.  - Increase Revatio to 60 mg tid.   [name] 08/17/2012 8:16 AM

## 2012-08-17 NOTE — Progress Notes (Signed)
Patient ID: [name], male   DOB: [birth year], 78 y.o.   MRN: [id number]   [facility] Progress Note    Subjective:   Reports to be feeling better, ambulated around hallways without any problems. He was not able to talk with his friend about dialysis yesterday and anticipates that she'll be coming around today.    Objective:   BP 110/58  Pulse 68  Temp 98 F (36.7 C) (Oral)  Resp 18  Ht 5\' 9"  (1.753 m)  Wt 88.8 kg (195 lb 12.3 oz)  BMI 28.91 kg/m2  SpO2 93%  Intake/Output Summary (Last 24 hours) at 08/17/12 1127 Last data filed at 08/17/12 0554  Gross per 24 hour  Intake      3 ml  Output    725 ml  Net   -722 ml   Weight change: -3.1 kg (-6 lb 13.3 oz)  Physical Exam: Gen: Comfortably resting in bed, family by bedside CVS: Pulse regular rate and rhythm, heart sounds S1 and S2 normal Resp: Clear to auscultation bilaterally, suboptimal respiratory effort Abd: Soft, obese, nontender and bowel sounds are normal Ext: No lower extremity edema  Imaging: No results found.  Labs: BMET  Lab 08/17/12 0540 08/16/12 0430 08/15/12 0515 08/14/12 0500 08/13/12 0845 08/12/12 0515 08/11/12 0450  NA 134* 132* 129* 130* 132* 132* 128*  K 3.1* 3.5 3.6 3.7 3.9 3.5 3.3*  CL 93* 92* 90* 90* 92* 93* 90*  CO2 28 25 26 26 28 27 27   GLUCOSE 74 112* 117* 84 122* 171* 176*  BUN 95* 95* 96* 91* 85* 85* 86*  CREATININE 4.66* 4.62* 4.71* 5.10* 4.79* 4.36* 4.65*  ALB -- -- -- -- -- -- --  CALCIUM 9.2 9.4 9.3 9.6 9.6 9.5 9.5  PHOS 4.6 4.8* 5.1* 5.3* 5.1* 4.8* 4.3   CBC  Lab 08/16/12 0430  WBC 4.8  NEUTROABS --  HGB 9.0*  HCT 27.7*  MCV 85.8  PLT 155    Medications:      . amLODipine  10 mg Oral Daily  . aspirin EC  81 mg Oral Daily  . atorvastatin  20 mg Oral q1800  . [EXPIRED]  ceFAZolin (ANCEF) IV  2 g Intravenous On Call to OR  . darbepoetin (ARANESP) injection - NON-DIALYSIS  60 mcg Subcutaneous Q Mon-1800  . ferrous sulfate  325 mg Oral BID WC  .  furosemide  240 mg Oral TID  . heparin  5,000 Units Subcutaneous Q8H  . insulin aspart  0-15 Units Subcutaneous TID WC  . insulin detemir  15 Units Subcutaneous Daily  . linagliptin  5 mg Oral Daily  . sildenafil  60 mg Oral TID  . sodium chloride  3 mL Intravenous Q12H  . [DISCONTINUED] sildenafil  40 mg Oral TID     Assessment/ Plan:   1. AKI/CKD- appears to have a fixed urine output of 1 L to 1.5 L on current dose of oral Lasix-convert from intravenous to oral yesterday. He remains very unsure as to whether he wants to pursue renal replacement therapy and wants to speak to a close family friend who is on dialysis so that he can get a better understanding of what the quality of life will be on this therapy. Was scheduled for access placement yesterday which has been canceled. No acute dialysis needs recognized and renal function remains low but unchanged. If he chooses not to do dialysis, palliative care/home hospice may be helpful. If he agrees to have renal  replacement therapy, this may be done electively given current clinical scenario. 2. CHF- as above, improved with PO lasix/revatio/dopamine, clinically doing better however with acute on chronic cardiorenal syndrome leading to renal insufficiency on CKD.   3. Pulmonary HTN- unclear etiology, no evidence of pulmonary embolism-on intravenous prostaglandin therapy to help reduce pulmonary artery pressures.  4. Anemia of chronic disease- follow iron stores and on ESA, he is s/p IV iron therapy. No overt losses noted.   Zetta Bills, MD 08/17/2012, 11:27 AM

## 2012-08-18 LAB — RENAL FUNCTION PANEL
BUN: 88 mg/dL — ABNORMAL HIGH (ref 6–23)
CO2: 28 mEq/L (ref 19–32)
Calcium: 9.7 mg/dL (ref 8.4–10.5)
Creatinine, Ser: 4.45 mg/dL — ABNORMAL HIGH (ref 0.50–1.35)
GFR calc Af Amer: 13 mL/min — ABNORMAL LOW (ref >90)
Glucose, Bld: 143 mg/dL — ABNORMAL HIGH (ref 70–99)
Phosphorus: 4.3 mg/dL (ref 2.3–4.6)
Sodium: 135 mEq/L (ref 135–145)

## 2012-08-18 LAB — GLUCOSE, CAPILLARY
Glucose-Capillary: 94 mg/dL (ref 70–99)
Glucose-Capillary: 142 mg/dL — ABNORMAL HIGH (ref 70–99)
Glucose-Capillary: 149 mg/dL — ABNORMAL HIGH (ref 70–99)

## 2012-08-18 MED ORDER — AMLODIPINE BESYLATE 5 MG PO TABS
5.0000 mg | ORAL_TABLET | Freq: Every day | ORAL | Status: DC
  Administered 2012-08-18 – 2012-08-20 (×3): 5 mg via ORAL
  Filled 2012-08-18 (×3): qty 1

## 2012-08-18 NOTE — Progress Notes (Signed)
Primary cardiologist: Dr. Olga Millers  Subjective:   No chest pain. Denies dyspnea   Objective:   Temp:  [97.9 F (36.6 C)-98.3 F (36.8 C)] 98.3 F (36.8 C) (11/22 0438) Pulse Rate:  [73] 73  (11/22 0438) Resp:  [17-18] 18  (11/22 0438) BP: (114-129)/(57-72) 129/72 mmHg (11/22 0438) SpO2:  [91 %-96 %] 96 % (11/22 0438) Last BM Date: 08/16/12  Filed Weights   08/15/12 0523 08/16/12 0528 08/17/12 0324  Weight: 202 lb 11.2 oz (91.944 kg) 202 lb 9.6 oz (91.9 kg) 195 lb 12.3 oz (88.8 kg)    Intake/Output Summary (Last 24 hours) at 08/18/12 0645 Last data filed at 08/18/12 0440  Gross per 24 hour  Intake    720 ml  Output   1200 ml  Net   -480 ml    Exam:  General: No acute distress.  Lungs: Diminished BS bases (mild)  Cardiac: Regular rate and rhythm, accentuated S2. 2/6 systolic murmur  Abdomen: Protuberant, bowel sounds present, nontender.  Extremities: trace-1+ edema  Lab Results:  Basic Metabolic Panel:  Lab 08/17/12 1610 08/16/12 0430 08/15/12 0515  NA 134* 132* 129*  K 3.1* 3.5 3.6  CL 93* 92* 90*  CO2 28 25 26   GLUCOSE 74 112* 117*  BUN 95* 95* 96*  CREATININE 4.66* 4.62* 4.71*  CALCIUM 9.2 9.4 9.3  MG 2.6* -- --    Liver Function Tests:  Lab 08/17/12 0540 08/16/12 0430 08/15/12 0515  AST -- -- --  ALT -- -- --  ALKPHOS -- -- --  BILITOT -- -- --  PROT -- -- --  ALBUMIN 3.3* 3.2* 3.3*    Medications:   Scheduled Medications:    . amLODipine  10 mg Oral Daily  . aspirin EC  81 mg Oral Daily  . atorvastatin  20 mg Oral q1800  . darbepoetin (ARANESP) injection - NON-DIALYSIS  60 mcg Subcutaneous Q Mon-1800  . ferrous sulfate  325 mg Oral BID WC  . furosemide  240 mg Oral TID  . heparin  5,000 Units Subcutaneous Q8H  . insulin aspart  0-15 Units Subcutaneous TID WC  . insulin detemir  15 Units Subcutaneous Daily  . linagliptin  5 mg Oral Daily  . sildenafil  60 mg Oral TID  . sodium chloride  3 mL Intravenous Q12H  .  [DISCONTINUED] sildenafil  40 mg Oral TID    Infusions:    PRN Medications: acetaminophen, acetaminophen, ALPRAZolam, calcium carbonate (dosed in mg elemental calcium), camphor-menthol, dextrose, docusate sodium, feeding supplement (NEPRO CARB STEADY), hydrALAZINE, hydrOXYzine, ondansetron (ZOFRAN) IV, ondansetron (ZOFRAN) IV, ondansetron, sodium chloride, sorbitol, zolpidem   Assessment:   1. Pulmonary arterial hypertension, PASP 60 mmHg (mean 38) by right heart catheterization 11/1. Repeat right heart cath 11/15 showed PA pressure 66/24, PCWP 26 and CO 6.2. Recent VQ scan not suggesting chronic PE, and hemodynamics not clearly consistent with pericardial constriction. ANA and HIV negative. Echocardiographic bubble study was negative for obvious shunt. PFTs show severe restriction and diffusion abnormality. He has been started on sildenafil. Outpatient sleep study.  2. Known multivessel CAD status post CABG with cardiac catheterization in March of this year noted in the H&P.  LVEF 50-55%.  3. Acute on chronic renal insufficiency - cardiorenal syndrome  4. Hypertension.  5. Hyponatremia   Plan/Discussion:    Some improvement in symptoms with diuresis; however, renal function continues to deteriorate. Nephrology following; patient appears to be agreeable to dialysis. Patient can be DCed once plans for  dialysis made. Continue present meds but decrease amlodipine to 5 mg po daily. Olga Millers, M.D., F.A.C.C. 6:45 AM

## 2012-08-18 NOTE — Progress Notes (Signed)
Subjective: [name] is feeling better - breathing is better, no chest pain.  Objective: Lab: Lab Results  Component Value Date   WBC 4.8 08/16/2012   HGB 9.0* 08/16/2012   HCT 27.7* 08/16/2012   MCV 85.8 08/16/2012   PLT 155 08/16/2012   BMET    Component Value Date/Time   NA 135 08/18/2012 0520   K 3.2* 08/18/2012 0520   CL 93* 08/18/2012 0520   CO2 28 08/18/2012 0520   GLUCOSE 143* 08/18/2012 0520   BUN 88* 08/18/2012 0520   CREATININE 4.45* 08/18/2012 0520   CREATININE 1.89* 12/20/2011 1233   CALCIUM 9.7 08/18/2012 0520   GFRNONAA 11* 08/18/2012 0520   GFRAA 13* 08/18/2012 0520     Imaging:  Scheduled Meds:   . amLODipine  5 mg Oral Daily  . aspirin EC  81 mg Oral Daily  . atorvastatin  20 mg Oral q1800  . darbepoetin (ARANESP) injection - NON-DIALYSIS  60 mcg Subcutaneous Q Mon-1800  . ferrous sulfate  325 mg Oral BID WC  . furosemide  240 mg Oral TID  . heparin  5,000 Units Subcutaneous Q8H  . insulin aspart  0-15 Units Subcutaneous TID WC  . insulin detemir  15 Units Subcutaneous Daily  . linagliptin  5 mg Oral Daily  . sildenafil  60 mg Oral TID  . sodium chloride  3 mL Intravenous Q12H  . [DISCONTINUED] amLODipine  10 mg Oral Daily   Continuous Infusions:  PRN Meds:.acetaminophen, acetaminophen, ALPRAZolam, calcium carbonate (dosed in mg elemental calcium), camphor-menthol, dextrose, docusate sodium, feeding supplement (NEPRO CARB STEADY), hydrALAZINE, hydrOXYzine, ondansetron (ZOFRAN) IV, ondansetron (ZOFRAN) IV, ondansetron, sodium chloride, sorbitol, zolpidem   Physical Exam: Filed Vitals:   08/18/12 0438  BP: 129/72  Pulse: 73  Temp: 98.3 F (36.8 C)  Resp: 18  Gen'l- pleasant elderly AA man in no distress Cor- RRR Pulm - no increased WOB, no rale      Assessment/Plan: 1. DM -  CBG (last 3)   Basename 08/18/12 0607 08/17/12 2108 08/17/12 1624  GLUCAP 134* 248* 119*    Generally doing OK on present regimen. He could go home on  the current regimen.  2. Renal - had a long talk with [name] (30+ min) - explained the process of access creation, both temporary and permanent; reviewed the process of going to dialysis 3/wk and what to expect. We discussed the benefits and the "cost" of HD. He has a very practical insight into life.  He is willing to proceed with HD but very much wants to have the first treatments as an in-patient.  [name] [facility] [phone]) [phone]; (c) [phone] Call-grp - [name]  Tele: [phone]  08/18/2012, 8:44 AM

## 2012-08-18 NOTE — Progress Notes (Addendum)
Progress Note: VVS  Pt had vein mapping which shows good sized cephalic vein in Left upper arm  Prob Left Brachiocephalic AVF -  Will discuss timing with office  We will do procedure on Wednesday or Pt can be done as an outpatient if he is discharged.

## 2012-08-18 NOTE — Progress Notes (Signed)
Patient ID: [name], male   DOB: 15-Aug-1931, 78 y.o.   MRN: [id number]   [facility] Progress Note    Subjective:   Reports that he feels better-now excepting of initiating/planning for dialysis    Objective:   BP 129/72  Pulse 73  Temp 98.3 F (36.8 C) (Oral)  Resp 18  Ht 5\' 9"  (1.753 m)  Wt 88.8 kg (195 lb 12.3 oz)  BMI 28.91 kg/m2  SpO2 96%  Intake/Output Summary (Last 24 hours) at 08/18/12 1136 Last data filed at 08/18/12 0730  Gross per 24 hour  Intake    720 ml  Output   1200 ml  Net   -480 ml   Weight change:   Physical Exam: Gen: Comfortably resting up in bed CVS: Pulse regular in rate and rhythm, heart sounds S1 and S2 normal  Resp: Diminished breath sounds left base otherwise clear to auscultation  Abd: Soft, obese, nontender and bowel sounds are normal NWG:NFAOZ lower extremity edema   Imaging: No results found.  Labs: BMET  Lab 08/18/12 0520 08/17/12 0540 08/16/12 0430 08/15/12 0515 08/14/12 0500 08/13/12 0845 08/12/12 0515  NA 135 134* 132* 129* 130* 132* 132*  K 3.2* 3.1* 3.5 3.6 3.7 3.9 3.5  CL 93* 93* 92* 90* 90* 92* 93*  CO2 28 28 25 26 26 28 27   GLUCOSE 143* 74 112* 117* 84 122* 171*  BUN 88* 95* 95* 96* 91* 85* 85*  CREATININE 4.45* 4.66* 4.62* 4.71* 5.10* 4.79* 4.36*  ALB -- -- -- -- -- -- --  CALCIUM 9.7 9.2 9.4 9.3 9.6 9.6 9.5  PHOS 4.3 4.6 4.8* 5.1* 5.3* 5.1* 4.8*   CBC  Lab 08/16/12 0430  WBC 4.8  NEUTROABS --  HGB 9.0*  HCT 27.7*  MCV 85.8  PLT 155    Medications:      . amLODipine  5 mg Oral Daily  . aspirin EC  81 mg Oral Daily  . atorvastatin  20 mg Oral q1800  . darbepoetin (ARANESP) injection - NON-DIALYSIS  60 mcg Subcutaneous Q Mon-1800  . ferrous sulfate  325 mg Oral BID WC  . furosemide  240 mg Oral TID  . heparin  5,000 Units Subcutaneous Q8H  . insulin aspart  0-15 Units Subcutaneous TID WC  . insulin detemir  15 Units Subcutaneous Daily  . linagliptin  5 mg Oral Daily  .  sildenafil  60 mg Oral TID  . sodium chloride  3 mL Intravenous Q12H  . [DISCONTINUED] amLODipine  10 mg Oral Daily     Assessment/ Plan:   1. AKI/CKD- appears to have a fixed urine output of 1 L to 1.5 L on current dose of oral Lasix-convert from intravenous to oral yesterday. He now states that he would choose dialysis over death and is willing to proceed forth with having permanent dialysis access placed. At this time, fortunately, he does not have any acute indications for hemodialysis so a catheter of may be avoided. Difficult situation given cardiorenal syndrome as to predict when the appropriate timing will be to initiate dialysis. Will need close renal followup upon discharge .  I. we'll reconsult vascular surgery.  2. CHF- as above, improved with PO lasix/revatio/dopamine, clinically doing better however with acute on chronic cardiorenal syndrome leading to renal insufficiency on CKD.  3. Pulmonary HTN- unclear etiology, no evidence of pulmonary embolism-on intravenous prostaglandin therapy to help reduce pulmonary artery pressures.  4. Anemia of chronic disease- follow iron stores and  on ESA, he is s/p IV iron therapy. No overt losses noted.   Zetta Bills, MD 08/18/2012, 11:36 AM

## 2012-08-19 LAB — RENAL FUNCTION PANEL
Albumin: 3.3 g/dL — ABNORMAL LOW (ref 3.5–5.2)
BUN: 83 mg/dL — ABNORMAL HIGH (ref 6–23)
Creatinine, Ser: 4.09 mg/dL — ABNORMAL HIGH (ref 0.50–1.35)
Glucose, Bld: 139 mg/dL — ABNORMAL HIGH (ref 70–99)
Phosphorus: 3.9 mg/dL (ref 2.3–4.6)
Potassium: 3 mEq/L — ABNORMAL LOW (ref 3.5–5.1)

## 2012-08-19 LAB — GLUCOSE, CAPILLARY: Glucose-Capillary: 194 mg/dL — ABNORMAL HIGH (ref 70–99)

## 2012-08-19 MED ORDER — POTASSIUM CHLORIDE CRYS ER 20 MEQ PO TBCR
20.0000 meq | EXTENDED_RELEASE_TABLET | Freq: Three times a day (TID) | ORAL | Status: AC
  Administered 2012-08-19 – 2012-08-20 (×4): 20 meq via ORAL
  Filled 2012-08-19 (×2): qty 1

## 2012-08-19 MED ORDER — POTASSIUM CHLORIDE 20 MEQ PO PACK
20.0000 meq | PACK | Freq: Once | ORAL | Status: DC
  Filled 2012-08-19: qty 1

## 2012-08-19 MED ORDER — POTASSIUM CHLORIDE CRYS ER 20 MEQ PO TBCR
20.0000 meq | EXTENDED_RELEASE_TABLET | Freq: Two times a day (BID) | ORAL | Status: DC
  Administered 2012-08-19 – 2012-08-20 (×2): 20 meq via ORAL
  Filled 2012-08-19 (×4): qty 1

## 2012-08-19 NOTE — Progress Notes (Addendum)
Patient ID: [name], male   DOB: 1931/08/07, 78 y.o.   MRN: [id number]    Primary cardiologist: Dr. Olga Millers  Subjective:   No chest pain. Denies dyspnea walking around room.    Objective:   Temp:  [97.4 F (36.3 C)-98.3 F (36.8 C)] 98.3 F (36.8 C) (11/23 0540) Pulse Rate:  [74-78] 77  (11/23 0540) Resp:  [18-20] 20  (11/23 0540) BP: (126-130)/(58-65) 126/65 mmHg (11/23 0540) SpO2:  [95 %-97 %] 97 % (11/23 0540) Weight:  [197 lb 3.2 oz (89.449 kg)] 197 lb 3.2 oz (89.449 kg) (11/23 0540) Last BM Date: 08/18/12  Filed Weights   08/16/12 0528 08/17/12 0324 08/19/12 0540  Weight: 202 lb 9.6 oz (91.9 kg) 195 lb 12.3 oz (88.8 kg) 197 lb 3.2 oz (89.449 kg)    Intake/Output Summary (Last 24 hours) at 08/19/12 1207 Last data filed at 08/19/12 0824  Gross per 24 hour  Intake    840 ml  Output   1550 ml  Net   -710 ml    Exam:  General: No acute distress.  Lungs: Diminished BS bases (mild)  Cardiac: Regular rate and rhythm, accentuated S2. 2/6 systolic murmur  Abdomen: Protuberant, bowel sounds present, nontender.  Extremities: trace-1+ edema  Neck: JVP 8-9 cm  Lab Results:  Basic Metabolic Panel:  Lab 08/19/12 4098 08/18/12 0520 08/17/12 0540  NA 133* 135 134*  K 3.0* 3.2* 3.1*  CL 93* 93* 93*  CO2 29 28 28   GLUCOSE 139* 143* 74  BUN 83* 88* 95*  CREATININE 4.09* 4.45* 4.66*  CALCIUM 9.3 9.7 9.2  MG -- -- 2.6*    Liver Function Tests:  Lab 08/19/12 0659 08/18/12 0520 08/17/12 0540  AST -- -- --  ALT -- -- --  ALKPHOS -- -- --  BILITOT -- -- --  PROT -- -- --  ALBUMIN 3.3* 3.4* 3.3*    Medications:   Scheduled Medications:    . amLODipine  5 mg Oral Daily  . aspirin EC  81 mg Oral Daily  . atorvastatin  20 mg Oral q1800  . darbepoetin (ARANESP) injection - NON-DIALYSIS  60 mcg Subcutaneous Q Mon-1800  . ferrous sulfate  325 mg Oral BID WC  . furosemide  240 mg Oral TID  . heparin  5,000 Units Subcutaneous Q8H  . insulin aspart   0-15 Units Subcutaneous TID WC  . insulin detemir  15 Units Subcutaneous Daily  . linagliptin  5 mg Oral Daily  . potassium chloride  20 mEq Oral TID  . sildenafil  60 mg Oral TID  . sodium chloride  3 mL Intravenous Q12H    Infusions:    PRN Medications: acetaminophen, acetaminophen, ALPRAZolam, calcium carbonate (dosed in mg elemental calcium), camphor-menthol, dextrose, docusate sodium, feeding supplement (NEPRO CARB STEADY), hydrALAZINE, hydrOXYzine, ondansetron (ZOFRAN) IV, ondansetron (ZOFRAN) IV, ondansetron, sodium chloride, sorbitol, zolpidem   Assessment:   1. Pulmonary arterial hypertension, PASP 60 mmHg (mean 38) by right heart catheterization 11/1. Repeat right heart cath 11/15 showed PA pressure 66/24, PCWP 26 and CO 6.2. Recent VQ scan not suggesting chronic PE, and hemodynamics not clearly consistent with pericardial constriction. ANA and HIV negative. Echocardiographic bubble study was negative for obvious shunt. PFTs show severe restriction and diffusion abnormality. He has been started on sildenafil. Outpatient sleep study.  2. Known multivessel CAD status post CABG with cardiac catheterization in March of this year noted in the H&P.  LVEF 50-55%.  3. Acute on chronic renal insufficiency -  cardiorenal syndrome  4. Hypertension.  5. Hyponatremia   Plan/Discussion:    Some improvement in symptoms with diuresis; however, renal function has deteriorated.  It has remained stable over the last day or so.  On the current high dose Lasix dose, he makes 1000-1500 cc/day.  He has been seen by vascular surgery and will need AV fistula.  Appears this can be done on Wednesday as an outpatient.  As he is stable, I am going to plan to discharge him today.   He will need close followup by renal => would arrange for BMET to be drawn on Monday and for him to be seen on Tuesday at the nephrology office if possible.  He can be seen in the cardiology office later in the week.  He will  need AV fistula placement => will need to confirm with vascular surgery that they will do this Wednesday. He can go home on the following medications:  Lasix 240 mg tid, KCl 20 mEq tid , FeSO4 325 bid, amlodipine 5 daily, ASA 81, atorvastatin 20, Revatio 60 mg tid.  Would check with renal service prior to discharge to see if they want any changes to this.   [name] 08/19/2012 12:13 PM

## 2012-08-19 NOTE — Progress Notes (Signed)
Mr. Gries discharge will be postponed until tomorrow. He is still contemplating whether he wants to proceed with dialysis. He will discuss this further with Dr. Shirlee Latch tomorrow.  Hersey, PA-C 08/19/2012 4:33 PM  [phone] pgr

## 2012-08-20 ENCOUNTER — Encounter (HOSPITAL_COMMUNITY): Payer: Self-pay | Admitting: Cardiology

## 2012-08-20 LAB — RENAL FUNCTION PANEL
GFR calc Af Amer: 15 mL/min — ABNORMAL LOW (ref >90)
Glucose, Bld: 191 mg/dL — ABNORMAL HIGH (ref 70–99)
Phosphorus: 3.7 mg/dL (ref 2.3–4.6)
Potassium: 3.3 mEq/L — ABNORMAL LOW (ref 3.5–5.1)
Sodium: 135 mEq/L (ref 135–145)

## 2012-08-20 MED ORDER — POTASSIUM CHLORIDE 20 MEQ PO PACK: 20.0000 meq | PACK | Freq: Once | ORAL | Status: DC

## 2012-08-20 MED ORDER — POTASSIUM CHLORIDE CRYS ER 20 MEQ PO TBCR: 20.0000 meq | EXTENDED_RELEASE_TABLET | Freq: Three times a day (TID) | ORAL | Status: DC

## 2012-08-20 MED ORDER — FERROUS SULFATE 325 (65 FE) MG PO TABS: 325.0000 mg | ORAL_TABLET | Freq: Two times a day (BID) | ORAL | Status: DC

## 2012-08-20 MED ORDER — POTASSIUM CHLORIDE CRYS ER 20 MEQ PO TBCR
20.0000 meq | EXTENDED_RELEASE_TABLET | Freq: Once | ORAL | Status: AC
  Administered 2012-08-20: 20 meq via ORAL

## 2012-08-20 MED ORDER — LINAGLIPTIN 5 MG PO TABS: 5.0000 mg | ORAL_TABLET | Freq: Every day | ORAL | Status: DC

## 2012-08-20 MED ORDER — INSULIN DETEMIR 100 UNIT/ML ~~LOC~~ SOLN: 15.0000 [IU] | Freq: Every day | SUBCUTANEOUS | Status: DC

## 2012-08-20 MED ORDER — FUROSEMIDE 80 MG PO TABS: 240.0000 mg | ORAL_TABLET | Freq: Three times a day (TID) | ORAL | Status: DC

## 2012-08-20 MED ORDER — ATORVASTATIN CALCIUM 20 MG PO TABS: 20.0000 mg | ORAL_TABLET | Freq: Every day | ORAL | Status: DC

## 2012-08-20 MED ORDER — AMLODIPINE BESYLATE 5 MG PO TABS: 5.0000 mg | ORAL_TABLET | Freq: Every day | ORAL | Status: DC

## 2012-08-20 MED ORDER — SILDENAFIL CITRATE 20 MG PO TABS: 60.0000 mg | ORAL_TABLET | Freq: Three times a day (TID) | ORAL | Status: DC

## 2012-08-20 NOTE — Progress Notes (Signed)
Patient ID: [name], male   DOB: 19-Jul-1931, 78 y.o.   MRN: [id number]     Primary cardiologist: Dr. Olga Millers  Subjective:   No chest pain. Denies dyspnea walking around room.    Objective:   Temp:  [98.1 F (36.7 C)-98.4 F (36.9 C)] 98.3 F (36.8 C) (11/24 0618) Pulse Rate:  [73-74] 73  (11/24 0618) Resp:  [18-20] 18  (11/24 0618) BP: (124-132)/(52-72) 130/52 mmHg (11/24 0618) SpO2:  [97 %-99 %] 98 % (11/24 0618) Weight:  [194 lb (87.998 kg)] 194 lb (87.998 kg) (11/24 0618) Last BM Date: 08/18/12  Filed Weights   08/17/12 0324 08/19/12 0540 08/20/12 0618  Weight: 195 lb 12.3 oz (88.8 kg) 197 lb 3.2 oz (89.449 kg) 194 lb (87.998 kg)    Intake/Output Summary (Last 24 hours) at 08/20/12 0942 Last data filed at 08/20/12 0618  Gross per 24 hour  Intake    480 ml  Output   1000 ml  Net   -520 ml    Exam:  General: No acute distress.  Lungs: Diminished BS bases (mild)  Cardiac: Regular rate and rhythm, accentuated S2. 2/6 systolic murmur  Abdomen: Protuberant, bowel sounds present, nontender.  Extremities: trace-1+ edema  Neck: JVP 8-9 cm  Lab Results:  Basic Metabolic Panel:  Lab 08/20/12 4098 08/19/12 0659 08/18/12 0520 08/17/12 0540  NA 135 133* 135 --  K 3.3* 3.0* 3.2* --  CL 94* 93* 93* --  CO2 29 29 28  --  GLUCOSE 191* 139* 143* --  BUN 80* 83* 88* --  CREATININE 4.03* 4.09* 4.45* --  CALCIUM 9.5 9.3 9.7 --  MG -- -- -- 2.6*    Liver Function Tests:  Lab 08/20/12 0645 08/19/12 0659 08/18/12 0520  AST -- -- --  ALT -- -- --  ALKPHOS -- -- --  BILITOT -- -- --  PROT -- -- --  ALBUMIN 3.4* 3.3* 3.4*    Medications:   Scheduled Medications:    . amLODipine  5 mg Oral Daily  . aspirin EC  81 mg Oral Daily  . atorvastatin  20 mg Oral q1800  . darbepoetin (ARANESP) injection - NON-DIALYSIS  60 mcg Subcutaneous Q Mon-1800  . ferrous sulfate  325 mg Oral BID WC  . furosemide  240 mg Oral TID  . heparin  5,000 Units  Subcutaneous Q8H  . insulin aspart  0-15 Units Subcutaneous TID WC  . insulin detemir  15 Units Subcutaneous Daily  . linagliptin  5 mg Oral Daily  . potassium chloride  20 mEq Oral TID  . potassium chloride  20 mEq Oral BID  . sildenafil  60 mg Oral TID  . sodium chloride  3 mL Intravenous Q12H  . [DISCONTINUED] potassium chloride  20 mEq Oral Once    Infusions:    PRN Medications: acetaminophen, acetaminophen, ALPRAZolam, calcium carbonate (dosed in mg elemental calcium), camphor-menthol, dextrose, docusate sodium, feeding supplement (NEPRO CARB STEADY), hydrALAZINE, hydrOXYzine, ondansetron (ZOFRAN) IV, ondansetron (ZOFRAN) IV, ondansetron, sodium chloride, sorbitol, zolpidem   Assessment:   1. Pulmonary arterial hypertension, PASP 60 mmHg (mean 38) by right heart catheterization 11/1. Repeat right heart cath 11/15 showed PA pressure 66/24, PCWP 26 and CO 6.2. Recent VQ scan not suggesting chronic PE, and hemodynamics not clearly consistent with pericardial constriction. ANA and HIV negative. Echocardiographic bubble study was negative for obvious shunt. PFTs show severe restriction and diffusion abnormality. He has been started on sildenafil. Outpatient sleep study.  2. Known multivessel CAD  status post CABG with cardiac catheterization in March of this year noted in the H&P.  LVEF 50-55%.  3. Acute on chronic renal insufficiency - cardiorenal syndrome  4. Hypertension.  5. Hyponatremia   Plan/Discussion:    Some improvement in symptoms with diuresis; however, renal function has deteriorated.  It has remained stable over the last day or so.  On the current high dose Lasix dose, he makes 1000-1500 cc/day.  He has been seen by vascular surgery and will need AV fistula.  Appears this can be done on Wednesday as an outpatient.  As he is stable, I am going to plan to discharge him today.   He will need close followup by renal => would arrange for BMET to be drawn mid-week and for him  to be seen in nephrology office on Thursday.  He can be seen in the cardiology office later in the week or early the following week.  He will need AV fistula placement => will need to confirm with vascular surgery that they will do this Wednesday. He can go home on the following medications:  Lasix 240 mg tid, KCl 20 mEq tid , FeSO4 325 bid, amlodipine 5 daily, ASA 81, atorvastatin 20, Revatio 60 mg tid.  Would check with renal service prior to discharge to see if they want any changes to this.   [name] 08/20/2012 9:42 AM

## 2012-08-20 NOTE — Discharge Summary (Signed)
Discharge Summary   Patient ID: [name] MRN: [id number], DOB/AGE: 1930/10/17 78 y.o.  Primary MD: Illene Regulus, MD Primary Cardiologist: Dr. [name] Admit date: 07/24/2012 D/C date:     08/20/2012      Primary Discharge Diagnoses:  1. Pulmonary arterial hypertension  - RHC 11/1 PASP 60 mmHg (mean 38); RHC 11/15 PA pressure 66/24, PCWP 26 and CO 6.2  - VQ scan negative for PE; ANA and HIV negative; Echo bubble study negative for obvious shunt; PFTs show severe restriction and diffusion abnormality  - Initiated on Sildenafil  - Outpatient Sleep Study  Acute on Chronic Diastolic CHF  - Echo demonstrated EF 50-55%, mod diastolic dysfunction, mild MR, mod TR, mild R/LAE, mild RV dilatation, PASP .  - Dc'd on Lasix 240mg  TID  - DC Weight 194lbs  Acute on chronic renal failure  - Cardiorenal syndrome  - Crt 2.0 -->5.1 --> 4.0  - Plan for AV fistula on 08/23/12  - F/u w/ Renal Diabetes mellitus with hypoglycemia Secondary Hyperparathyrodism  Hypertension Hypokalemia Hyponatremia  Iron Defeciency Anemia  Secondary Discharge Diagnoses:  . CAD (coronary artery disease)     s/p 3v CABG 2006, myoview 04/2010 EF 49%, prior inferior/apical infarct, no ischemia, LHC 11/2011 stable anatomy (occluded LAD filled from vein graft, distal LCx occluded prior to OM2, OM2 filled from vein graft, RCA prox 25%, mid 40%, distal 25% lesions, SVG to diagonal occluded) Med Rx  . Cerebrovascular disease     Carotid dopplers R 40-595, Left 60-70% stenoses  . HLD (hyperlipidemia)   . Mediastinal adenopathy     per CT chest in 2006 with PET scan showing very limited metabolic activity; not felt to have a significant neoplastic potential  . GERD (gastroesophageal reflux disease)   . AV block, Mobitz 1     Noted 11/2011 in hospital, BB stopped  . Colon polyps   . Diverticulosis     Allergies No Known Allergies  Diagnostic Studies/Procedures:   07/24/2012  - CT HEAD WITHOUT  CONTRAST  Findings: There is atrophy and chronic small vessel disease changes. No acute intracranial abnormality.  Specifically, no hemorrhage, hydrocephalus, mass lesion, acute infarction, or significant intracranial injury.  No acute calvarial abnormality. Visualized paranasal sinuses and mastoids clear.  Orbital soft tissues unremarkable.  IMPRESSION: No acute intracranial abnormality.  Atrophy, chronic microvascular disease.    07/25/12 - Echo Study Conclusions: - Left ventricle: The cavity size was normal. Wall thickness was increased in a pattern of mild LVH. Systolic function was normal. The estimated ejection fraction was in the range of 50% to 55%. There is akinesis of the posterior myocardium. Features are consistent with a pseudonormal left ventricular filling pattern, with concomitant abnormal relaxation and increased filling pressure (grade 2 diastolic dysfunction). - Mitral valve: Mild regurgitation. - Left atrium: The atrium was mildly dilated. - Right ventricle: The cavity size was mildly dilated. - Right atrium: The atrium was mildly dilated. - Tricuspid valve: Moderate regurgitation. - Pulmonary arteries: Systolic pressure was severely increased. PA peak pressure: 76mm Hg (S).  07/28/12 - Echocardiographic Bubble Study Study Conclusions: Impressions: This is a limited study to follow the study of 07/25/2012. There is hypokinesisof the inferolateral wall. A bubble study was done. There injection was good. There is no obvious shunt noted.    07/26/2012 - PULMONARY VENTILATION AND PERFUSION SCAN   Findings: Normal ventilation of both lungs.  Normal perfusion of both lungs.  IMPRESSION: Normal examination.  No evidence of pulmonary embolism.  07/28/12 - Right Heart Cath Hemodynamics (mmHg)  RA mean 12  RV 54/14  PA 60/23, mean 38  PCWP mean 16  LV 122/17  AO 124/53  Simultaneous LV/RV pressure tracings were recorded. The RV tracing did appear to track with the LV (not  discordant as one would expect with constrictive pericarditis). The LV and RV pressure tracings did not have a dip and plateau pattern.  Oxygen saturations:  PA 64%  AO 87%  Cardiac Output (Fick) 8.75 (? Accuracy given variation in arterial saturation during case)  Cardiac Index (Fick) 4.09  Transpulmonary gradient 22 mmHg  Final Conclusions: Pulmonary arterial hypertension with elevated transpulmonary gradient (out of proportion to left-sided filling pressures). Hemodynamics did not appear consistent with pericardial constriction. Etiology of PAH uncertain. V/Q scan did not suggest chronic PE. Would check ANA, LFTs, HIV, outpatient sleep study. Will get bubble study to rule out shunt (high cardiac output => though as above think it may be inaccurate).  Recommendations: Reasonable for trial of sildenafil 20 mg tid. Will stop Imdur. Creatinine up to 2.9 today. PCWP is not elevated. As creatinine has risen up from 2.3 yesterday, I will hold Lasix today. He will need to restart in the future but would let creatinine stabilize out. I will also hold losartan with rise in creatinine. As BP is high, will increase amlodipine to 10. As above, limited echo with bubble study to rule out shunt lesion.  08/04/2012- RENAL/URINARY TRACT ULTRASOUND  Findings:  Right Kidney:  Right kidney is diffusely echogenic.  It is normal in size with no masses, stones or hydronephrosis.  Right kidney measures 10.8 cm.  Left Kidney:  Left kidney is diffusely echogenic.  It is normal in size with no masses, stones or hydronephrosis.  It measures 10.6 cm.  Bladder:  Unremarkable.  Bilateral pleural effusions are incidentally noted.  IMPRESSION: Diffusely echogenic kidneys consistent with medical renal disease. Kidneys remain normal in size.  No hydronephrosis or renal masses.    08/08/2012 -  METASTATIC BONE SURVEY  Findings: Imaging included and lateral images fall, imaging of the spine, imaging of the chest and pelvis and imaging of  the humeri and femurs.  There are no lytic lesions to suggest multiple myeloma.  The chest radiograph shows hazy opacity at the right base with less opacity at the medial left lung base unchanged from the previous day's study.  There are degenerative changes throughout the visualized spine. There is a mild compression fracture of L1 that is likely old. There are degenerative changes with chondrocalcinosis of both knees.  There are degenerative changes of both AC joints and of the left glenohumeral joint.  The bones are diffusely demineralized.  IMPRESSION: No osteolytic lesions to suggest multiple myeloma.  No evidence of neoplastic disease to bone.      08/11/12 - Right Heart Cath Hemodynamics:  RA: 22/25/21  RV: 70/7  PCWP: 26  PA: 66/24 (42)  Cardiac Output  Thermodilution: 6.22 with index of 2.93  Fick : 9.32 with index of 4.4  Arterial Sat: 98 % on 2 l [place]  PA Sat: 73% .  Conclusions:  1. Markedly volume overloaded. He has not had any significant urine output after getting IV Lasix. He may need dialysis.   History of Present Illness: 78 y.o. male w/ the above medical problems who was admitted to [facility] from clnic on 07/24/12 with complaints of chest pain and dyspnea.   Hospital Course: EKG revealed NSR with 1st degree AVB, lateral  TWI, no significant change from prior EKG.  CXR showed CHF changes with mild pulmonary edema and bilateral pleural effusions. Labs were significant for normal troponin, Hgb 10.4, BUN/Crt 27/2.03, pBNP 4471, DDimer 1.49. Cardiac enzymes were cycled and remained negative. Echo demonstrated EF 50-55%, mod diastolic dysfunction, mild MR, mod TR, mild R/LAE, mild RV dilatation, PASP . Given his dyspnea, tachycardia, and elevated DDimer a VQ was performed and negative for PE. Given continued dyspnea and unclear cause of severe pulm HTN a right heart cath was performed showing PAH w/ elevated transpulmonary gradient (out of proportion to left-sided  filling pressures). Hemodynamics did not appear consistent with pericardial constriction. Recommendations were made for trial of sildenafil and echo bubble study to r/o shunt. Bubble study was negative for obvious shunt. ANA and HIV were negative.  He was gently diuresed with close monitoring of electrolytes and renal function. His symptoms and volume status improved, but unfortunately his renal function worsened with peak Creatinine 5.1. Diuretics and ARB were held. He became volume overloaded and dyspneic with CXR showing CHF and bilat pleural effusions for which diuretics were resumed and Nephrology consulted. Renal US showed diffusely echogenic kidneys consistent with medical renal disease, normal sized without hydronephrosis. Anemia panel showed low iron for which he was placed on supplementation. He continued to complain of dyspnea and urine output decreased so a RHC was repeated revealing marked volume overload. He was placed on Lasix and dopamine drips with some improvement in symptoms and volume status, but his renal function remained poor. Initiating dialysis was discussed with Mr. Viggiano, but he was very hesitant and not sure he wanted to proceed. In the mean time vein mapping was completed. Crt improved slightly and remained stable. It was felt he did not need acute dialysis. Mr. Perro has not decided if he will go forward with dialysis, but did agree to have AV fistula placed which Vascular surgery will arrange for 11/27. Dopamine drip was discontinued and Lasix was transitioned to oral dosing. His symptoms improved and he felt ready to go home.   Other notable events: Episode of confusion, slurred speech and ?facial droop on 10/28 evaluated by head CT which was without acute intracranial findings. Neurology felt it was related by hypoglycemia (blood sugar 22) with recommendations for no further neuro interventions. Mental status returned to baseline with glucose administration. Toprol was stopped  due to AVB on telemetry. Other antihypertensives were adjusted for better BP control (see dc med list). Kappa free to lambda free light chain ration was noted to be high for which Hematology was consulted. Bone survey was completed without evidence of neoplastic disease to bone or osteolytic lesions to suggest multiple myeloma. Per Hematology notes: No monoclonal proteins on serum IFE but monoclonal kappa light chains in urine. 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 No futher workup indicated at this time.  He was seen and evaluated by Dr. Shirlee Latch who felt he was stable for discharge home with plans for follow up as scheduled below. Vascular surgery will arrange for AV fistula placement on 11/27. Nephrology (Dr. Allena Katz) will contact patient regarding lab and office follow up this week.  Discharge Vitals: Blood pressure 130/52, pulse 73, temperature 98.3 F (36.8 C), temperature source Oral, resp. rate 18, height 5\' 9"  (1.753 m), weight 194 lb (87.998 kg), SpO2 98.00%.  Labs: Component Value Date   WBC 4.8 08/16/2012   HGB 9.0* 08/16/2012  HCT 27.7* 08/16/2012   MCV 85.8 08/16/2012   PLT 155 08/16/2012     08/03/2012 15:29  Iron 20 (L)  UIBC 259  TIBC 279  Saturation Ratios 7 (L)  Ferritin 147   Lab 08/20/12 0645  NA 135  K 3.3*  CL 94*  CO2 29  BUN 80*  CREATININE 4.03*  CALCIUM 9.5  GLUCOSE 191*   Component Value Date   DDIMER 1.49* 07/25/2012     07/24/2012 15:40 07/24/2012 21:20 07/25/2012 03:07  Troponin I <0.30 <0.30 <0.30     07/25/2012 17:19  Pro B Natriuretic peptide (BNP) 4471.0 (H)     07/24/2012 15:41  Hemoglobin A1C 6.6 (H)     08/03/2012 15:29  PTH 121.2 (H)     08/11/2012 14:04  TSH 2.250     07/28/2012 06:00  ANA NEGATIVE     07/29/2012 06:10  HIV NON REACTIVE    08/03/2012 15:29  Total Protein ELP 7.1  Albumin ELP 49.8 (L)    Alpha-1-Globulin 5.7 (H)  Alpha-2-Globulin 14.7 (H)  Beta Globulin 5.1  Beta 2 4.9  Gamma Globulin 19.8 (H)  M-SPIKE, % NOT DETECTED  SPE Interp. (NOTE)  Comment (NOTE)  Kappa free light chain 90.70 (H)  Lamda free light chains 3.78 (H)  Kappa, lamda light chain ratio 23.99 (H)     08/08/2012 18:31  IgG (Immunoglobin G), Serum 1550  IgA 178  IgM, Serum 78    Discharge Medications     Medication List     As of 08/20/2012 12:13 PM    STOP taking these medications         glimepiride 2 MG tablet   Commonly known as: AMARYL      hydrALAZINE 25 MG tablet   Commonly known as: APRESOLINE      isosorbide mononitrate 30 MG 24 hr tablet   Commonly known as: IMDUR      losartan 50 MG tablet   Commonly known as: COZAAR      metoprolol succinate 25 MG 24 hr tablet   Commonly known as: TOPROL-XL      nitroGLYCERIN 0.4 MG SL tablet   Commonly known as: NITROSTAT      simvastatin 40 MG tablet   Commonly known as: ZOCOR      TAKE these medications         amLODipine 5 MG tablet   Commonly known as: NORVASC   Take 1 tablet (5 mg total) by mouth daily.      aspirin 81 MG EC tablet   Take 1 tablet (81 mg total) by mouth daily.      atorvastatin 20 MG tablet   Commonly known as: LIPITOR   Take 1 tablet (20 mg total) by mouth at bedtime.      ferrous sulfate 325 (65 FE) MG tablet   Take 1 tablet (325 mg total) by mouth 2 (two) times daily with a meal.      furosemide 80 MG tablet   Commonly known as: LASIX   Take 3 tablets (240 mg total) by mouth 3 (three) times daily.      insulin detemir 100 UNIT/ML injection   Commonly known as: LEVEMIR   Inject 15 Units into the skin at bedtime.      Insulin Pen Needle 30G X 8 MM Misc   Commonly known as: NOVOFINE   Use as directed once daily      linagliptin 5 MG Tabs tablet   Commonly known as: TRADJENTA  Take 1 tablet (5 mg total) by mouth daily.      Lite Touch Lancets Misc   Inject 1 Device into the skin daily.       ONE TOUCH ULTRA TEST test strip   Generic drug: glucose blood   1 each by Other route as needed.      potassium chloride SA 20 MEQ tablet   Commonly known as: K-DUR,KLOR-CON   Take 1 tablet (20 mEq total) by mouth 3 (three) times daily.      sildenafil 20 MG tablet   Commonly known as: REVATIO   Take 3 tablets (60 mg total) by mouth 3 (three) times daily.          Disposition   Discharge Orders    Future Orders Please Complete By Expires   Diet - low sodium heart healthy      Increase activity slowly      Discharge instructions      Comments:   * Vascular surgery will contact you with instructions regarding your dialysis graft placement scheduled for 08/23/12.  * Nephrology will contact you this week with your appointment time to see Dr. Allena Katz  [facility] cardiology will call you this week with your appointment time to see Dr. [name]  * Please take all medications as prescribed and bring them with you to your office visits.     Follow-up Information    Follow up with Dagoberto Ligas., MD. (The office will call you with your appointment information)    Contact information:   [address] [facility] [place] [address] [phone]       Follow up with Olga Millers, MD. (Our office will call you with your appointment time)    Contact information:   [facility] [address] [place], [place] [address] [place] [address] [phone]           Outstanding Labs/Studies:  1. AV Fistula placement 2. BMET 3. Sleep Study  Duration of Discharge Encounter: Greater than 30 minutes including physician and PA time.  Signed, Marrissa Dai PA-C 08/20/2012, 12:13 PM

## 2012-08-20 NOTE — Progress Notes (Signed)
Discharge instructions reviewed, all questions answered. Heart failure packet given and education completed with teach back. Patient discharged home.

## 2012-08-21 ENCOUNTER — Other Ambulatory Visit: Payer: Self-pay | Admitting: *Deleted

## 2012-08-21 ENCOUNTER — Encounter: Payer: Self-pay | Admitting: *Deleted

## 2012-08-22 ENCOUNTER — Encounter (HOSPITAL_COMMUNITY): Payer: Self-pay | Admitting: Pharmacy Technician

## 2012-08-25 ENCOUNTER — Other Ambulatory Visit: Payer: Self-pay | Admitting: Cardiology

## 2012-08-25 DIAGNOSIS — I6529 Occlusion and stenosis of unspecified carotid artery: Secondary | ICD-10-CM

## 2012-08-29 ENCOUNTER — Encounter (INDEPENDENT_AMBULATORY_CARE_PROVIDER_SITE_OTHER): Payer: Medicare Other

## 2012-08-29 ENCOUNTER — Encounter: Payer: Self-pay | Admitting: Physician Assistant

## 2012-08-29 ENCOUNTER — Telehealth: Payer: Self-pay | Admitting: *Deleted

## 2012-08-29 ENCOUNTER — Ambulatory Visit (INDEPENDENT_AMBULATORY_CARE_PROVIDER_SITE_OTHER): Payer: Medicare Other | Admitting: Physician Assistant

## 2012-08-29 VITALS — BP 145/77 | HR 88 | Ht 71.5 in | Wt 190.1 lb

## 2012-08-29 DIAGNOSIS — I6529 Occlusion and stenosis of unspecified carotid artery: Secondary | ICD-10-CM

## 2012-08-29 DIAGNOSIS — I2721 Secondary pulmonary arterial hypertension: Secondary | ICD-10-CM

## 2012-08-29 DIAGNOSIS — I5032 Chronic diastolic (congestive) heart failure: Secondary | ICD-10-CM

## 2012-08-29 DIAGNOSIS — I2510 Atherosclerotic heart disease of native coronary artery without angina pectoris: Secondary | ICD-10-CM

## 2012-08-29 DIAGNOSIS — I10 Essential (primary) hypertension: Secondary | ICD-10-CM

## 2012-08-29 DIAGNOSIS — I2789 Other specified pulmonary heart diseases: Secondary | ICD-10-CM

## 2012-08-29 DIAGNOSIS — N185 Chronic kidney disease, stage 5: Secondary | ICD-10-CM

## 2012-08-29 LAB — BASIC METABOLIC PANEL
BUN: 54 mg/dL — ABNORMAL HIGH (ref 6–23)
Creatinine, Ser: 2.9 mg/dL — ABNORMAL HIGH (ref 0.4–1.5)
GFR: 27.08 mL/min — ABNORMAL LOW (ref >60.00)
Glucose, Bld: 101 mg/dL — ABNORMAL HIGH (ref 70–99)

## 2012-08-29 NOTE — Telephone Encounter (Signed)
s/w [name] @ Dr. Arlys John Chen's office today in reference to 12/4 AV Fistula procedure. pt states he did not know about procedure to [name]. PA today and really wants to see Dr. Allena Katz 1st before procedure. I lmom for Dr. Eliane Decree office to call pt today

## 2012-08-29 NOTE — Patient Instructions (Addendum)
Your physician recommends that you return for lab work in: TODAY BMET   Your physician has requested that you have a carotid duplex THIS IS TO BE DONE IN 6 MONTHS DX 433.10. This test is an ultrasound of the carotid arteries in your neck. It looks at blood flow through these arteries that supply the brain with blood. Allow one hour for this exam. There are no restrictions or special instructions.   Your physician recommends that you schedule a follow-up appointment in: 3-4 WEEK APPROX WITH DR. CRENSHAW  PLEASE FOLLOW UP WITH DR. PATEL AND DR. CHEN OFFICE ABOUT YOUR UPCOMING SURGERY  I CALLED [facility] AND I WILL BE SENDING IN A PRIOR AUTHORIZATION FOR THE REVATIO

## 2012-08-29 NOTE — Progress Notes (Signed)
[address], [address] [place], [place]  [address] Phone: [phone], Fax:  [phone]  Date:  08/29/2012   Name:  [name]   DOB:  1931/01/02   MRN:  [id number]  PCP:  Illene Regulus, MD  Primary Cardiologist:  Dr. Olga Millers  Primary Electrophysiologist:  None    History of Present Illness: [name] is a 78 y.o. male who returns for follow up after recent admission to the hospital for a/c diastolic CHF in the setting of newly dx pulmonary HTN complicated by a/c renal failure approaching end stage.  He has a hx of CAD, s/p CABG in 2006, carotid stenosis, DM2, HTN, HL.  Myoview 04/2010:  EF 49%, prior inferior and apical infarct, but no ischemia.  Carotid Dopplers 5/13:  40-59% RICA, 60-70% LICA.  Echo 4/13:  EF 60-65% and mild MR. [facility] 3/13: occluded LAD, mid and dist LAD filled from the SVG, apical LAD 99% (unchanged), dCFX occluded prior to OM2, OM2 filled via the SVG, RCA prox 25%, mid 40% and distal 25%, SVG to Dx was occluded. Medical therapy recommended.   Patient was seen in the office 07/24/12 with complaints of chest pain and dyspnea. There were concerns for volume overload and he was admitted for diuresis. He was hospitalized 10/28-11/24. This admission was complex.  Echo 07/25/12: Mild LVH, EF 50-55%, posterior AK, grade 2 diastolic dysfunction, mild MR, mild LAE, mild RVE, mild RAE, moderate TR, PASP 76.  Patient had an elevated d-dimer. V/Q scan was negative for pulmonary embolism. ANA and HIV were both negative. Bubble study was negative for obvious shunt. He ultimately underwent RHC due to his significant pulmonary hypertension: PA 60/23, mean 38, PCWP 16, CO 8.75. Hemodynamics did not appear to be consistent with pericardial constriction. High cardiac output was thought to be inaccurate. Etiology of his pulmonary hypertension was unclear. Trial of sildenafil was recommended.  Outpatient sleep study recommended.  Patient was diuresed. He  initially improved with this but then became more volume overloaded. His renal function worsened. He was seen by nephrology. Ultrasound demonstrated medical renal disease of his kidneys. He had reduced urine output and repeat RHC was performed that demonstrated PCWP 26. He was felt to be markedly volume overloaded and his Lasix was adjusted. He was also placed on dopamine. Nephrology recommended proceeding with initiation of dialysis. Patient was somewhat hesitant. He did agree to AV fistula placement. This was arranged for 11/27 (after discharge).   Hospitalization also complicated by hypoglycemic episode that necessitated neurology consultation (symptoms of confusion, slurred speech, ? facial droop). No further neurologic evaluation was recommended. He had SPEP/UPEP with elevated Kappa/Lambda free light chain ratio. He was seen by hematology and ultimately diagnosed with MGUS.  Beta blocker was d/c'd 2/2 bradycardia/AV block.    Since discharge, he has not been contacted by nephrology or vascular surgery. In reviewing his chart, it appears that he is set up for AV fistula creation tomorrow. The patient is not aware of this. He is still very hesitant to proceed with dialysis. His breathing is improved.  He describes class II-IIb symptoms. He denies orthopnea, PND. LE edema is much improved. He denies chest pain or syncope.  Labs (11/13):   K 3.3, creatinine 5.10 (peak); creatinine 4.03 (at d/c), ALT 24, pBNP 4471, Kappa/Lambda ratio 23.99, Hgb 9, TSH 2.250, ANA negative, HIV non reactive  Wt Readings from Last 3 Encounters:  08/29/12 190 lb 1.9 oz (86.238 kg)  08/20/12 194 lb (87.998 kg)  08/20/12 194 lb (87.998 kg)     Past Medical History  Diagnosis Date  . CAD (coronary artery disease)     s/p 3v CABG 2006, myoview 04/2010 EF 49%, prior inferior/apical infarct, no ischemia, LHC 11/2011 stable anatomy (occluded LAD filled from vein graft, distal LCx occluded prior to OM2, OM2 filled from vein  graft, RCA prox 25%, mid 40%, distal 25% lesions, SVG to diagonal occluded) Med Rx  . Chronic diastolic heart failure     hx of cardiorenal syndrome;  Echo 06/2012 EF 50-55%, mod diastolic dysfunction, mild MR, mod TR, mild R/LAE, mild RV dilatation, PASP .  . Carotid stenosis     a. Carotid dopplers R 40-59%, Left 60-79%;   b. carotids 12/13:  0-39% RICA, 60-79% LICA (rpt in 6 mos)  . HLD (hyperlipidemia)   . Mediastinal adenopathy     per CT chest in 2006 with PET scan showing very limited metabolic activity; not felt to have a significant neoplastic potential  . Iron deficiency anemia   . Hypertension   . Diabetes mellitus     insulin dependent   . GERD (gastroesophageal reflux disease)   . AV block, Mobitz 1     Noted 11/2011 in hospital, BB stopped  . Colon polyps   . Diverticulosis   . Pulmonary arterial hypertension     a.  RHC 11/1 PASP 60 mmHg (mean 38); b. RHC 11/15 PA pressure 66/24, PCWP 26 and CO 6.2 => Sildenafil started;   c. Echo bubble study 11/13:  no obvious shunt;   d. VQ scan neg for pulmonary embolism  . CKD (chronic kidney disease) stage 5, GFR less than 15 ml/min     hx of cardiorenal syndrome;  Dr Allena Katz;  AVF recommended and scheduled (patient hesitant to proceed);   Renal US 11/13: diff echogenic kidneys c/w medical renal disease; no hydronephrosis or renal mass  . Hyperparathyroidism, secondary renal   . MGUS (monoclonal gammopathy of unknown significance)     a. Kappa/Lambda free light chain ratio 11/13:  23.99;  b. Met. Bone survey 11/13:  no osteolytic lesions to suggest MMyeloma    Current Outpatient Prescriptions  Medication Sig Dispense Refill  . amLODipine (NORVASC) 5 MG tablet Take 5 mg by mouth daily.      Marland Kitchen aspirin 81 MG EC tablet Take 81 mg by mouth daily.      Marland Kitchen atorvastatin (LIPITOR) 20 MG tablet Take 20 mg by mouth at bedtime.      . ferrous sulfate 325 (65 FE) MG tablet Take 325 mg by mouth 2 (two) times daily with a meal.      .  furosemide (LASIX) 80 MG tablet Take 240 mg by mouth 3 (three) times daily.       . insulin detemir (LEVEMIR) 100 UNIT/ML injection Inject 30 Units into the skin at bedtime.      . Insulin Pen Needle (NOVOFINE) 30G X 8 MM MISC Use as directed once daily  100 each  3  . linagliptin (TRADJENTA) 5 MG TABS tablet Take 5 mg by mouth daily.      . Lite Touch Lancets MISC Inject 1 Device into the skin daily.  30 each  11  . ONE TOUCH ULTRA TEST test strip 1 each by Other route as needed.       . potassium chloride SA (K-DUR,KLOR-CON) 20 MEQ tablet Take 20 mEq by mouth 3 (three) times daily.        Allergies:  No Known Allergies  Social History:  The patient  reports that he quit smoking about 58 years ago. He has never used smokeless tobacco. He reports that he does not drink alcohol or use illicit drugs.   ROS:  Please see the history of present illness.   All other systems reviewed and negative.   PHYSICAL EXAM: VS:  BP 145/77  Pulse 88  Ht 5' 11.5" (1.816 m)  Wt 190 lb 1.9 oz (86.238 kg)  BMI 26.15 kg/m2 Well nourished, well developed, in no acute distress HEENT: normal Neck: no JVD Cardiac:  normal S1, S2; RRR; no murmur Lungs:  clear to auscultation bilaterally, no wheezing, rhonchi or rales Abd: soft, nontender, no hepatomegaly Ext: no edema Skin: warm and dry Neuro:  CNs 2-12 intact, no focal abnormalities noted  EKG:  NSR, HR 85, LAD, first degree AV block, PR interval 244 ms, IVCD, PVCs, nonspecific ST-T wave changes, no change from prior tracing     ASSESSMENT AND PLAN:  1. Chronic Diastolic CHF:   Overall his volume appears to be stable. He is on a massive dose of Lasix. This will be continued. Check a basic metabolic panel today.  2. Chronic Kidney Disease:   We called Vascular Surgery today.  His procedure is scheduled tomorrow.  He was unaware of this.  [facility] will call him at home today to arrange.  Of note, the patient does not seem interested in having the AVF placed.  We  have also been in touch with Nephrology and asked that the patient be contacted for a follow up visit.  Check BMET today.  3. Pulmonary Hypertension:   Patient could not get Sildenafil due to cost.  We will try to get prior auth completed to get med approved.  4. Hypertension:   Fair control.  Continue current Rx and monitor.  5. Coronary Artery Disease:   No angina.  Continue ASA and statin.  6. Carotid Stenosis:   Stable bilateral disease by dopplers done today.  Repeat in 6 mos.  7. Disposition:   Follow up with Dr. Olga Millers in 3-4 weeks.   Luna Glasgow, PA-C  11:38 AM 08/29/2012

## 2012-08-30 ENCOUNTER — Telehealth: Payer: Self-pay | Admitting: *Deleted

## 2012-08-30 ENCOUNTER — Encounter (HOSPITAL_COMMUNITY): Admission: RE | Payer: Self-pay | Source: Ambulatory Visit

## 2012-08-30 SURGERY — ARTERIOVENOUS (AV) FISTULA CREATION
Anesthesia: Monitor Anesthesia Care | Site: Arm Upper | Laterality: Left

## 2012-08-30 NOTE — Telephone Encounter (Signed)
pt notified about lab results w/verbal understanding today. I will fax results to both Dr. Allena Katz and Dr. Leonides Sake

## 2012-08-30 NOTE — Telephone Encounter (Signed)
Message copied by Tarri Fuller on Wed Aug 30, 2012  9:21 AM ------      Message from: [place], [place] T      Created: Wed Aug 30, 2012  8:55 AM       Creatinine improved.      Continue with current treatment plan.      Fax to Nephrologist.       Tereso Newcomer, PA-C  8:54 AM 08/30/2012

## 2012-08-31 ENCOUNTER — Ambulatory Visit (HOSPITAL_COMMUNITY): Admission: RE | Admit: 2012-08-31 | Payer: Medicare Other | Source: Ambulatory Visit | Admitting: Vascular Surgery

## 2012-09-29 ENCOUNTER — Encounter: Payer: Self-pay | Admitting: Cardiology

## 2012-09-29 ENCOUNTER — Ambulatory Visit (INDEPENDENT_AMBULATORY_CARE_PROVIDER_SITE_OTHER): Payer: Medicare Other | Admitting: Cardiology

## 2012-09-29 VITALS — BP 153/80 | HR 84 | Wt 194.0 lb

## 2012-09-29 DIAGNOSIS — I509 Heart failure, unspecified: Secondary | ICD-10-CM

## 2012-09-29 DIAGNOSIS — Z8679 Personal history of other diseases of the circulatory system: Secondary | ICD-10-CM

## 2012-09-29 DIAGNOSIS — I2721 Secondary pulmonary arterial hypertension: Secondary | ICD-10-CM

## 2012-09-29 DIAGNOSIS — I10 Essential (primary) hypertension: Secondary | ICD-10-CM

## 2012-09-29 DIAGNOSIS — I2510 Atherosclerotic heart disease of native coronary artery without angina pectoris: Secondary | ICD-10-CM

## 2012-09-29 DIAGNOSIS — I5033 Acute on chronic diastolic (congestive) heart failure: Secondary | ICD-10-CM

## 2012-09-29 DIAGNOSIS — N185 Chronic kidney disease, stage 5: Secondary | ICD-10-CM

## 2012-09-29 DIAGNOSIS — I2789 Other specified pulmonary heart diseases: Secondary | ICD-10-CM

## 2012-09-29 DIAGNOSIS — E785 Hyperlipidemia, unspecified: Secondary | ICD-10-CM

## 2012-09-29 LAB — BASIC METABOLIC PANEL
Calcium: 9.6 mg/dL (ref 8.4–10.5)
Creatinine, Ser: 2.5 mg/dL — ABNORMAL HIGH (ref 0.4–1.5)
GFR: 32.13 mL/min — ABNORMAL LOW (ref >60.00)
Sodium: 136 mEq/L (ref 135–145)

## 2012-09-29 NOTE — Assessment & Plan Note (Signed)
Blood pressure is mildly elevated. Continue present medications and follow. Adjust as needed.

## 2012-09-29 NOTE — Assessment & Plan Note (Signed)
Patient is markedly improved compared to when he was hospitalized. He has not been taking sildenafil but appears to be doing well off of this medication. I would not resume at this point. Continue present dose of diuretic. Etiology of his pulmonary hypertension is unclear. Possibly secondary to restrictive lung disease.

## 2012-09-29 NOTE — Progress Notes (Signed)
HPI: Pleasant male for fu of pulmonary HTN and CAD. Pt is  s/p CABG in 2006. Carotid Dopplers 12/13: 0-39% RICA, 60-70% LICA; fu in 6 months. [facility] 3/13: occluded LAD, mid and dist LAD filled from the SVG, apical LAD 99% (unchanged), dCFX occluded prior to OM2, OM2 filled via the SVG, RCA prox 25%, mid 40% and distal 25%, SVG to Dx was occluded. Medical therapy recommended.  Admitted 10/13 with dyspnea and CHF. Echo 07/25/12: Mild LVH, EF 50-55%, posterior AK, grade 2 diastolic dysfunction, mild MR, mild LAE, mild RVE, mild RAE, moderate TR, PASP 76. Patient diagnosed with pulmonary hypertension; V/Q scan was negative for pulmonary embolism. ANA and HIV were both negative. Bubble study was negative for obvious shunt. RHC due to his significant pulmonary hypertension: PA 60/23, mean 38, PCWP 16, CO 8.75. Hemodynamics did not appear to be consistent with pericardial constriction. High cardiac output was thought to be inaccurate. Etiology of his pulmonary hypertension was unclear. Trial of sildenafil was recommended. Outpatient sleep study recommended. Patient was diuresed. He initially improved with this but then became more volume overloaded. His renal function worsened. He was seen by nephrology. Ultrasound demonstrated medical renal disease of his kidneys. He had reduced urine output and repeat RHC was performed that demonstrated PCWP 26. He was felt to be markedly volume overloaded and his Lasix was adjusted. He was also placed on dopamine. Nephrology recommended proceeding with initiation of dialysis. He had SPEP/UPEP with elevated Kappa/Lambda free light chain ratio. He was seen by hematology and ultimately diagnosed with MGUS. Beta blocker was d/c'd 2/2 bradycardia/AV block. PFTs revealed restrictive lung disease. Patient seen in followup by Tereso Newcomer and was feeling well. His renal function was actually improving on high-dose diuretics. He is followed by nephrology and dialysis has been deferred for  now. He denies dyspnea, chest pain, palpitations, syncope or pedal edema.   Current Outpatient Prescriptions  Medication Sig Dispense Refill  . amLODipine (NORVASC) 10 MG tablet Take 10 mg by mouth daily.      Marland Kitchen aspirin 81 MG EC tablet Take 81 mg by mouth daily.      Marland Kitchen atorvastatin (LIPITOR) 20 MG tablet Take 20 mg by mouth at bedtime.      . ferrous sulfate 325 (65 FE) MG tablet Take 325 mg by mouth 2 (two) times daily with a meal.      . furosemide (LASIX) 80 MG tablet Take 240 mg by mouth 3 (three) times daily.       . insulin detemir (LEVEMIR) 100 UNIT/ML injection Inject 30 Units into the skin at bedtime.      . Insulin Pen Needle (NOVOFINE) 30G X 8 MM MISC Use as directed once daily  100 each  3  . Lite Touch Lancets MISC Inject 1 Device into the skin daily.  30 each  11  . ONE TOUCH ULTRA TEST test strip 1 each by Other route as needed.       . potassium chloride SA (K-DUR,KLOR-CON) 20 MEQ tablet Take 20 mEq by mouth 3 (three) times daily.         Past Medical History  Diagnosis Date  . CAD (coronary artery disease)     s/p 3v CABG 2006, myoview 04/2010 EF 49%, prior inferior/apical infarct, no ischemia, LHC 11/2011 stable anatomy (occluded LAD filled from vein graft, distal LCx occluded prior to OM2, OM2 filled from vein graft, RCA prox 25%, mid 40%, distal 25% lesions, SVG to diagonal occluded) Med Rx  .  Chronic diastolic heart failure     hx of cardiorenal syndrome;  Echo 06/2012 EF 50-55%, mod diastolic dysfunction, mild MR, mod TR, mild R/LAE, mild RV dilatation, PASP .  . Carotid stenosis     a. Carotid dopplers R 40-59%, Left 60-79%;   b. carotids 12/13:  0-39% RICA, 60-79% LICA (rpt in 6 mos)  . HLD (hyperlipidemia)   . Mediastinal adenopathy     per CT chest in 2006 with PET scan showing very limited metabolic activity; not felt to have a significant neoplastic potential  . Iron deficiency anemia   . Hypertension   . Diabetes mellitus     insulin dependent   .  GERD (gastroesophageal reflux disease)   . AV block, Mobitz 1     Noted 11/2011 in hospital, BB stopped  . Colon polyps   . Diverticulosis   . Pulmonary arterial hypertension     a.  RHC 11/1 PASP 60 mmHg (mean 38); b. RHC 11/15 PA pressure 66/24, PCWP 26 and CO 6.2 => Sildenafil started;   c. Echo bubble study 11/13:  no obvious shunt;   d. VQ scan neg for pulmonary embolism  . CKD (chronic kidney disease) stage 5, GFR less than 15 ml/min     hx of cardiorenal syndrome;  Dr Allena Katz;  AVF recommended and scheduled (patient hesitant to proceed);   Renal US 11/13: diff echogenic kidneys c/w medical renal disease; no hydronephrosis or renal mass  . Hyperparathyroidism, secondary renal   . MGUS (monoclonal gammopathy of unknown significance)     a. Kappa/Lambda free light chain ratio 11/13:  23.99;  b. Met. Bone survey 11/13:  no osteolytic lesions to suggest MMyeloma    Past Surgical History  Procedure Date  . Appendectomy   . Coronary artery bypass graft 2006    SVG to OM2, SVG to LAD, SVG to DX; (the LIMA did not have good flow and therefore was not used)  . Cardiac catheterization 12/21/2011    History   Social History  . Marital Status: Widowed    Spouse Name: N/A    Number of Children: 12  . Years of Education: N/A   Occupational History  . Retired    Social History Main Topics  . Smoking status: Former Smoker    Quit date: 09/27/1953  . Smokeless tobacco: Never Used  . Alcohol Use: No  . Drug Use: No  . Sexually Active: No   Other Topics Concern  . Not on file   Social History Narrative   10th grade. Married '64-10 yrs/widowed; married '77 - 32yrs/widowed. 7 sons, 5 daughters; 30+ grandchildren, 15 great-grands. 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 His son checks on him all the time.     ROS: no fevers or chills, productive cough, hemoptysis, dysphasia, odynophagia, melena, hematochezia, dysuria, hematuria, rash,  seizure activity, orthopnea, PND, pedal edema, claudication. Remaining systems are negative.  Physical Exam: Well-developed well-nourished in no acute distress.  Skin is warm and dry.  HEENT is normal.  Neck is supple.  Chest is clear to auscultation with normal expansion.  Cardiovascular exam is regular rate and rhythm.  Abdominal exam nontender or distended. No masses palpated. Extremities show no edema. neuro grossly intact  ECG sinus rhythm with first degree AV block. Occasional PVC. IVCD. Nonspecific T-wave changes.

## 2012-09-29 NOTE — Assessment & Plan Note (Signed)
Patient is much improved. Continue present dose of diuretic. Check potassium and renal function.

## 2012-09-29 NOTE — Assessment & Plan Note (Signed)
Continue aspirin and statin. Followup carotid Dopplers June 2014. 

## 2012-09-29 NOTE — Patient Instructions (Addendum)
Your physician wants you to follow-up in: 3 MONTHS WITH DR CRENSHAW You will receive a reminder letter in the mail two months in advance. If you don't receive a letter, please call our office to schedule the follow-up appointment.   Your physician recommends that you HAVE LAB WORK TODAY 

## 2012-09-29 NOTE — Assessment & Plan Note (Signed)
Followed by nephrology. 

## 2012-09-29 NOTE — Assessment & Plan Note (Signed)
Continue statin. 

## 2012-09-29 NOTE — Assessment & Plan Note (Signed)
Continue aspirin and statin. 

## 2012-10-03 ENCOUNTER — Other Ambulatory Visit: Payer: Self-pay | Admitting: Internal Medicine

## 2012-10-04 ENCOUNTER — Other Ambulatory Visit: Payer: Self-pay | Admitting: *Deleted

## 2012-10-04 MED ORDER — INSULIN DETEMIR 100 UNIT/ML ~~LOC~~ SOLN: 30.0000 [IU] | Freq: Every day | SUBCUTANEOUS | Status: DC

## 2012-12-04 ENCOUNTER — Encounter: Payer: Self-pay | Admitting: Cardiology

## 2012-12-12 ENCOUNTER — Other Ambulatory Visit: Payer: Self-pay | Admitting: *Deleted

## 2012-12-12 MED ORDER — ATORVASTATIN CALCIUM 20 MG PO TABS: 20.0000 mg | ORAL_TABLET | Freq: Every day | ORAL | Status: DC

## 2013-01-04 ENCOUNTER — Ambulatory Visit (INDEPENDENT_AMBULATORY_CARE_PROVIDER_SITE_OTHER): Payer: Medicare Other | Admitting: Cardiology

## 2013-01-04 ENCOUNTER — Encounter: Payer: Self-pay | Admitting: Cardiology

## 2013-01-04 VITALS — BP 127/69 | HR 73 | Ht 70.5 in | Wt 203.4 lb

## 2013-01-04 DIAGNOSIS — J069 Acute upper respiratory infection, unspecified: Secondary | ICD-10-CM | POA: Insufficient documentation

## 2013-01-04 DIAGNOSIS — I2789 Other specified pulmonary heart diseases: Secondary | ICD-10-CM

## 2013-01-04 DIAGNOSIS — I10 Essential (primary) hypertension: Secondary | ICD-10-CM

## 2013-01-04 DIAGNOSIS — E785 Hyperlipidemia, unspecified: Secondary | ICD-10-CM

## 2013-01-04 DIAGNOSIS — I2510 Atherosclerotic heart disease of native coronary artery without angina pectoris: Secondary | ICD-10-CM

## 2013-01-04 DIAGNOSIS — I2721 Secondary pulmonary arterial hypertension: Secondary | ICD-10-CM

## 2013-01-04 DIAGNOSIS — Z8679 Personal history of other diseases of the circulatory system: Secondary | ICD-10-CM

## 2013-01-04 MED ORDER — AZITHROMYCIN 250 MG PO TABS: ORAL_TABLET | ORAL | Status: DC

## 2013-01-04 NOTE — Assessment & Plan Note (Signed)
Patient given a Z-Pak today.

## 2013-01-04 NOTE — Assessment & Plan Note (Signed)
Continue statin. 

## 2013-01-04 NOTE — Assessment & Plan Note (Signed)
Continue aspirin and statin. Followup carotid Dopplers June 2014. 

## 2013-01-04 NOTE — Assessment & Plan Note (Signed)
Continue aspirin and statin. 

## 2013-01-04 NOTE — Assessment & Plan Note (Signed)
Continue present dose of diuretics. Renal function monitored by primary care.

## 2013-01-04 NOTE — Patient Instructions (Addendum)
Your physician wants you to follow-up in: 6 MONTHS WITH DR CRENSHAW You will receive a reminder letter in the mail two months in advance. If you don't receive a letter, please call our office to schedule the follow-up appointment.  

## 2013-01-04 NOTE — Progress Notes (Signed)
HPI: Pleasant male for fu of pulmonary HTN and CAD. Pt is s/p CABG in 2006. Carotid Dopplers 12/13: 0-39% RICA, 60-70% LICA; fu in 6 months. [facility] 3/13: occluded LAD, mid and dist LAD filled from the SVG, apical LAD 99% (unchanged), dCFX occluded prior to OM2, OM2 filled via the SVG, RCA prox 25%, mid 40% and distal 25%, SVG to Dx was occluded. Medical therapy recommended.  Admitted 10/13 with dyspnea and CHF. Echo 07/25/12: Mild LVH, EF 50-55%, posterior AK, grade 2 diastolic dysfunction, mild MR, mild LAE, mild RVE, mild RAE, moderate TR, PASP 76. Patient diagnosed with pulmonary hypertension; V/Q scan was negative for pulmonary embolism. ANA and HIV were both negative. Bubble study was negative for obvious shunt. RHC due to his significant pulmonary hypertension: PA 60/23, mean 38, PCWP 16, CO 8.75. Hemodynamics did not appear to be consistent with pericardial constriction. High cardiac output was thought to be inaccurate. Etiology of his pulmonary hypertension was unclear. Trial of sildenafil was recommended. Outpatient sleep study recommended. Patient was diuresed. He initially improved with this but then became more volume overloaded. His renal function worsened. He was seen by nephrology. Ultrasound demonstrated medical renal disease of his kidneys. He had reduced urine output and repeat RHC was performed that demonstrated PCWP 26. He was felt to be markedly volume overloaded and his Lasix was adjusted. He was also placed on dopamine. Nephrology recommended proceeding with initiation of dialysis. He had SPEP/UPEP with elevated Kappa/Lambda free light chain ratio. He was seen by hematology and ultimately diagnosed with MGUS. Beta blocker was d/c'd 2/2 bradycardia/AV block. PFTs revealed restrictive lung disease.  Patient last seen Jan 2014 and was much improved. Since then, he denies dyspnea, chest pain, syncope or pedal edema.  Current Outpatient Prescriptions  Medication Sig Dispense Refill  .  amLODipine (NORVASC) 10 MG tablet Take 10 mg by mouth daily.      Marland Kitchen atorvastatin (LIPITOR) 20 MG tablet Take 1 tablet (20 mg total) by mouth at bedtime.  30 tablet  3  . carvedilol (COREG) 6.25 MG tablet Take 6.25 mg by mouth 2 (two) times daily.      . furosemide (LASIX) 80 MG tablet Take 240 mg by mouth 3 (three) times daily.       . potassium chloride SA (K-DUR,KLOR-CON) 20 MEQ tablet Take 20 mEq by mouth 3 (three) times daily.      . ferrous sulfate 325 (65 FE) MG tablet Take 325 mg by mouth 2 (two) times daily with a meal.      . insulin detemir (LEVEMIR) 100 UNIT/ML injection Inject 30 Units into the skin at bedtime.  10 mL  2  . Insulin Pen Needle (NOVOFINE) 30G X 8 MM MISC Use as directed once daily  100 each  3  . LEVEMIR FLEXPEN 100 UNIT/ML injection INJECT 30 UNITS AT BEDTIME  10 mL  2  . Lite Touch Lancets MISC Inject 1 Device into the skin daily.  30 each  11  . ONE TOUCH ULTRA TEST test strip 1 each by Other route as needed.        No current facility-administered medications for this visit.     Past Medical History  Diagnosis Date  . CAD (coronary artery disease)     s/p 3v CABG 2006, myoview 04/2010 EF 49%, prior inferior/apical infarct, no ischemia, LHC 11/2011 stable anatomy (occluded LAD filled from vein graft, distal LCx occluded prior to OM2, OM2 filled from vein graft, RCA prox 25%, mid  40%, distal 25% lesions, SVG to diagonal occluded) Med Rx  . Chronic diastolic heart failure     hx of cardiorenal syndrome;  Echo 06/2012 EF 50-55%, mod diastolic dysfunction, mild MR, mod TR, mild R/LAE, mild RV dilatation, PASP .  . Carotid stenosis     a. Carotid dopplers R 40-59%, Left 60-79%;   b. carotids 12/13:  0-39% RICA, 60-79% LICA (rpt in 6 mos)  . HLD (hyperlipidemia)   . Mediastinal adenopathy     per CT chest in 2006 with PET scan showing very limited metabolic activity; not felt to have a significant neoplastic potential  . Iron deficiency anemia   . Hypertension    . Diabetes mellitus     insulin dependent   . GERD (gastroesophageal reflux disease)   . AV block, Mobitz 1     Noted 11/2011 in hospital, BB stopped  . Colon polyps   . Diverticulosis   . Pulmonary arterial hypertension     a.  RHC 11/1 PASP 60 mmHg (mean 38); b. RHC 11/15 PA pressure 66/24, PCWP 26 and CO 6.2 => Sildenafil started;   c. Echo bubble study 11/13:  no obvious shunt;   d. VQ scan neg for pulmonary embolism  . CKD (chronic kidney disease) stage 5, GFR less than 15 ml/min     hx of cardiorenal syndrome;  Dr Allena Katz;  AVF recommended and scheduled (patient hesitant to proceed);   Renal US 11/13: diff echogenic kidneys c/w medical renal disease; no hydronephrosis or renal mass  . Hyperparathyroidism, secondary renal   . MGUS (monoclonal gammopathy of unknown significance)     a. Kappa/Lambda free light chain ratio 11/13:  23.99;  b. Met. Bone survey 11/13:  no osteolytic lesions to suggest MMyeloma    Past Surgical History  Procedure Laterality Date  . Appendectomy    . Coronary artery bypass graft  2006    SVG to OM2, SVG to LAD, SVG to DX; (the LIMA did not have good flow and therefore was not used)  . Cardiac catheterization  12/21/2011    History   Social History  . Marital Status: Widowed    Spouse Name: N/A    Number of Children: 12  . Years of Education: N/A   Occupational History  . Retired    Social History Main Topics  . Smoking status: Former Smoker    Quit date: 09/27/1953  . Smokeless tobacco: Never Used  . Alcohol Use: No  . Drug Use: No  . Sexually Active: No   Other Topics Concern  . Not on file   Social History Narrative   10th grade. Married '64-10 yrs/widowed; married '77 - 64yrs/widowed. 7 sons, 5 daughters; 30+ grandchildren, 15 great-grands. 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 His son checks on him all the time.     ROS: recent URI with productive cough but hemoptysis, dysphasia,  odynophagia, melena, hematochezia, dysuria, hematuria, rash, seizure activity, orthopnea, PND, pedal edema, claudication. Remaining systems are negative.  Physical Exam: Well-developed well-nourished in no acute distress.  Skin is warm and dry.  HEENT is normal.  Neck is supple.  Chest is clear to auscultation with normal expansion.  Cardiovascular exam is regular rate and rhythm. 2/6 systolic ejection murmur Abdominal exam nontender or distended. No masses palpated. Extremities show trace edema. neuro grossly intact  ECG sinus rhythm with first degree AV block. Left anterior fascicular block. Left ventricular hypertrophy with repolarization  abnormality.

## 2013-01-04 NOTE — Assessment & Plan Note (Signed)
Patient symptomatically doing well today. No changes.

## 2013-01-04 NOTE — Assessment & Plan Note (Signed)
Continue present medications. 

## 2013-01-29 ENCOUNTER — Other Ambulatory Visit: Payer: Self-pay | Admitting: *Deleted

## 2013-01-29 MED ORDER — POTASSIUM CHLORIDE CRYS ER 20 MEQ PO TBCR: 20.0000 meq | EXTENDED_RELEASE_TABLET | Freq: Three times a day (TID) | ORAL | Status: DC

## 2013-03-08 ENCOUNTER — Other Ambulatory Visit: Payer: Self-pay

## 2013-03-08 MED ORDER — INSULIN PEN NEEDLE 32G X 4 MM MISC: Status: DC

## 2013-04-10 ENCOUNTER — Other Ambulatory Visit: Payer: Self-pay | Admitting: *Deleted

## 2013-04-10 MED ORDER — ATORVASTATIN CALCIUM 20 MG PO TABS: 20.0000 mg | ORAL_TABLET | Freq: Every day | ORAL | Status: DC

## 2013-05-12 ENCOUNTER — Other Ambulatory Visit: Payer: Self-pay | Admitting: Internal Medicine

## 2013-05-25 ENCOUNTER — Encounter: Payer: Medicare Other | Admitting: Physician Assistant

## 2013-05-25 NOTE — Progress Notes (Signed)
This encounter was created in error - please disregard.

## 2013-06-19 ENCOUNTER — Encounter: Payer: Self-pay | Admitting: Physician Assistant

## 2013-06-19 ENCOUNTER — Ambulatory Visit (INDEPENDENT_AMBULATORY_CARE_PROVIDER_SITE_OTHER): Payer: Medicare Other | Admitting: Physician Assistant

## 2013-06-19 VITALS — BP 139/77 | HR 53 | Ht 71.0 in | Wt 203.0 lb

## 2013-06-19 DIAGNOSIS — E785 Hyperlipidemia, unspecified: Secondary | ICD-10-CM

## 2013-06-19 DIAGNOSIS — I25810 Atherosclerosis of coronary artery bypass graft(s) without angina pectoris: Secondary | ICD-10-CM

## 2013-06-19 DIAGNOSIS — I4892 Unspecified atrial flutter: Secondary | ICD-10-CM

## 2013-06-19 DIAGNOSIS — I5032 Chronic diastolic (congestive) heart failure: Secondary | ICD-10-CM

## 2013-06-19 DIAGNOSIS — N185 Chronic kidney disease, stage 5: Secondary | ICD-10-CM

## 2013-06-19 DIAGNOSIS — I6529 Occlusion and stenosis of unspecified carotid artery: Secondary | ICD-10-CM

## 2013-06-19 DIAGNOSIS — I10 Essential (primary) hypertension: Secondary | ICD-10-CM

## 2013-06-19 LAB — BASIC METABOLIC PANEL
BUN: 41 mg/dL — ABNORMAL HIGH (ref 6–23)
Creatinine, Ser: 2.8 mg/dL — ABNORMAL HIGH (ref 0.4–1.5)
GFR: 28.37 mL/min — ABNORMAL LOW (ref >60.00)
Glucose, Bld: 63 mg/dL — ABNORMAL LOW (ref 70–99)
Potassium: 4.4 mEq/L (ref 3.5–5.1)

## 2013-06-19 LAB — CBC WITH DIFFERENTIAL/PLATELET
Basophils Absolute: 0 10*3/uL (ref 0.0–0.1)
Eosinophils Relative: 1 % (ref 0.0–5.0)
Lymphs Abs: 1.2 10*3/uL (ref 0.7–4.0)
Monocytes Absolute: 0.4 10*3/uL (ref 0.1–1.0)
Monocytes Relative: 8.2 % (ref 3.0–12.0)
Neutro Abs: 3.3 10*3/uL (ref 1.4–7.7)
Neutrophils Relative %: 67 % (ref 43.0–77.0)
Platelets: 138 10*3/uL — ABNORMAL LOW (ref 150.0–400.0)
RBC: 3.5 Mil/uL — ABNORMAL LOW (ref 4.22–5.81)
RDW: 16.7 % — ABNORMAL HIGH (ref 11.5–14.6)
WBC: 4.9 10*3/uL (ref 4.5–10.5)

## 2013-06-19 LAB — TSH: TSH: 1.58 u[IU]/mL (ref 0.35–5.50)

## 2013-06-19 LAB — PROTIME-INR: Prothrombin Time: 13 s — ABNORMAL HIGH (ref 10.2–12.4)

## 2013-06-19 MED ORDER — WARFARIN SODIUM 5 MG PO TABS: 5.0000 mg | ORAL_TABLET | Freq: Every day | ORAL | Status: DC

## 2013-06-19 NOTE — Progress Notes (Signed)
[address] [place], [place]  [address] Phone: [phone] Fax:  [phone]  Date:  06/19/2013   ID:  [name] 02-25-1931, MRN [id number]  PCP:  Illene Regulus, MD  Cardiologist:  Dr. Olga Millers     History of Present Illness: [name] is a 79 y.o. male who returns for follow up.  He has a hx of pulmonary HTN, CAD, diastolic CHF, ESRD, MGUS, carotid stenosis.  He is s/p CABG in 2006. Carotid Dopplers 12/13: 0-39% RICA, 60-70% LICA; fu in 6 months. LHC 3/13: occluded LAD, mid and dist LAD filled from the SVG, apical LAD 99% (unchanged), dCFX occluded prior to OM2, OM2 filled via the SVG, RCA prox 25%, mid 40% and distal 25%, SVG to Dx was occluded. Medical therapy recommended.   Admitted 10/13 with dyspnea and CHF. Echo 07/25/12: Mild LVH, EF 50-55%, posterior AK, Gr 2 DD, mild MR, mild LAE, mild RVE, mild RAE, mod TR, PASP 76. Patient dx with pulmonary HTN; V/Q scan was negative for pulmonary embolism. ANA and HIV were both negative. Bubble study was negative for obvious shunt. RHC due to his significant pulmonary hypertension: PA 60/23, mean 38, PCWP 16, CO 8.75. Hemodynamics did not appear to be consistent with pericardial constriction. High cardiac output was thought to be inaccurate. Etiology of his pulmonary HTN was unclear. Trial of sildenafil was recommended. OP sleep study was recommended. Patient was diuresed and initially improved, but then became more volume overloaded. His renal function worsened. He was seen by nephrology. Ultrasound demonstrated medical renal disease of his kidneys. He had reduced urine output and repeat RHC was performed that demonstrated PCWP 26. He was felt to be markedly volume overloaded and his Lasix was adjusted. He was also placed on dopamine. Nephrology recommended proceeding with initiation of dialysis. He had SPEP/UPEP with elevated Kappa/Lambda free light chain ratio. He was seen by hematology and ultimately  diagnosed with MGUS. Beta blocker was d/c'd 2/2 bradycardia/AV block. PFTs revealed restrictive lung disease.   Last seen by Dr. [name] 01/04/13. Followup carotid Dopplers were due in 6/14.  Over the last month or so, he has noted worsening fatigue and exercise intolerance.  He denies CP.  Denies significant dyspnea and describes NYHA class II-IIb symptoms.  No syncope.  No orthopnea, PND, edema.  No palpitations.  Labs (11/13): K 3.3, creatinine 5.10 (peak); creatinine 4.03 (at d/c), ALT 24, pBNP 4471, Kappa/Lambda ratio 23.99, Hgb 9, TSH 2.250, ANA negative, HIV non reactive Labs (12/13):  K 4.3, Cr 2.9 Labs (1/14):    K 4, creatinine 2.5  Wt Readings from Last 3 Encounters:  06/19/13 203 lb (92.08 kg)  01/04/13 203 lb 6.4 oz (92.262 kg)  09/29/12 194 lb (87.998 kg)     Past Medical History  Diagnosis Date  . CAD (coronary artery disease)     s/p 3v CABG 2006, myoview 04/2010 EF 49%, prior inferior/apical infarct, no ischemia, LHC 11/2011 stable anatomy (occluded LAD filled from vein graft, distal LCx occluded prior to OM2, OM2 filled from vein graft, RCA prox 25%, mid 40%, distal 25% lesions, SVG to diagonal occluded) Med Rx  . Chronic diastolic heart failure     hx of cardiorenal syndrome;  Echo 06/2012 EF 50-55%, mod diastolic dysfunction, mild MR, mod TR, mild R/LAE, mild RV dilatation, PASP .  . Carotid stenosis     a. Carotid dopplers R 40-59%, Left 60-79%;   b. carotids 12/13:  0-39% RICA, 60-79% LICA (  rpt in 6 mos)  . HLD (hyperlipidemia)   . Mediastinal adenopathy     per CT chest in 2006 with PET scan showing very limited metabolic activity; not felt to have a significant neoplastic potential  . Iron deficiency anemia   . Hypertension   . Diabetes mellitus     insulin dependent   . GERD (gastroesophageal reflux disease)   . AV block, Mobitz 1     Noted 11/2011 in hospital, BB stopped  . Colon polyps   . Diverticulosis   . Pulmonary arterial hypertension     a.  RHC  11/1 PASP 60 mmHg (mean 38); b. RHC 11/15 PA pressure 66/24, PCWP 26 and CO 6.2 => Sildenafil started;   c. Echo bubble study 11/13:  no obvious shunt;   d. VQ scan neg for pulmonary embolism  . CKD (chronic kidney disease) stage 5, GFR less than 15 ml/min     hx of cardiorenal syndrome;  Dr Allena Katz;  AVF recommended and scheduled (patient hesitant to proceed);   Renal US 11/13: diff echogenic kidneys c/w medical renal disease; no hydronephrosis or renal mass  . Hyperparathyroidism, secondary renal   . MGUS (monoclonal gammopathy of unknown significance)     a. Kappa/Lambda free light chain ratio 11/13:  23.99;  b. Met. Bone survey 11/13:  no osteolytic lesions to suggest MMyeloma  . Atrial flutter     diagnosed 05/2013; Coumadin initiated    Current Outpatient Prescriptions  Medication Sig Dispense Refill  . amLODipine (NORVASC) 10 MG tablet Take 10 mg by mouth daily.      Marland Kitchen aspirin 81 MG tablet Take 81 mg by mouth daily.      Marland Kitchen atorvastatin (LIPITOR) 20 MG tablet Take 1 tablet (20 mg total) by mouth at bedtime.  30 tablet  3  . carvedilol (COREG) 6.25 MG tablet Take 6.25 mg by mouth 2 (two) times daily.      . furosemide (LASIX) 80 MG tablet Take 240 mg by mouth 3 (three) times daily.       . insulin detemir (LEVEMIR) 100 UNIT/ML injection Inject 30 Units into the skin at bedtime.  10 mL  2  . Insulin Pen Needle 32G X 4 MM MISC Use as directed with insulin  100 each  11  . Lite Touch Lancets MISC Inject 1 Device into the skin daily.  30 each  11  . ONE TOUCH ULTRA TEST test strip 1 each by Other route as needed.       . potassium chloride SA (K-DUR,KLOR-CON) 20 MEQ tablet Take 1 tablet (20 mEq total) by mouth 3 (three) times daily.  90 tablet  3   No current facility-administered medications for this visit.    Allergies:   No Known Allergies  Social History:  The patient  reports that he quit smoking about 59 years ago. He has never used smokeless tobacco. He reports that he does not drink  alcohol or use illicit drugs.   ROS:  Please see the history of present illness.      All other systems reviewed and negative.   PHYSICAL EXAM: VS:  BP 139/77  Pulse 53  Ht 5\' 11"  (1.803 m)  Wt 203 lb (92.08 kg)  BMI 28.33 kg/m2 Well nourished, well developed, in no acute distress HEENT: normal Neck: + JVD Cardiac:  normal S1, S2; irregularly irregular; no murmur Lungs:  clear to auscultation bilaterally, no wheezing, rhonchi or rales Abd: soft, nontender, no hepatomegaly Ext: no  edema Skin: warm and dry Neuro:  CNs 2-12 intact, no focal abnormalities noted  EKG:  Atrial flutter, HR 52, inferolateral T wave inversions     ASSESSMENT AND PLAN:  1. Atrial Flutter: ECG appears to show atrial flutter. It is possible that this is actually coarse atrial fibrillation. This is new for him. It likely explains his fatigue.  CHADS2-VASc=6.  He will need long-term anticoagulation. Given his significant CKD, I will place him on Coumadin. I will check a basic metabolic panel, CBC, TSH and INR today. I will make sure that he has weekly follow ups with the Coumadin clinic. Once his INR is greater than 2 for 3 consecutive weeks, we can consider proceeding with cardioversion. I discussed with the patient today. I also reviewed this with Dr. [name] who agreed. 2. CAD: No angina. Continue aspirin and statin. 3. Diastolic CHF: Volume appears stable. Continue current therapy. 4. Hypertension: Controlled. 5. Hyperlipidemia: Continue statin. 6. Carotid stenosis: He is past due for follow up carotid US. This will be arranged. 7. Pulmonary Hypertension: Symptoms are stable. Continue current therapy.  8. CKD: Check basic metabolic panel today. 9. Disposition: Keep follow up with Dr. [name] 07/09/13.  Signed, Tereso Newcomer, PA-C  06/19/2013 12:14 PM

## 2013-06-19 NOTE — Patient Instructions (Addendum)
START COUMADIN 5MG  DAILY, EXCEPT Saturday AND Sunday TAKE 2.5MG   LABS TODAY: BMET, CBC, TSH, PT/INR  Your physician has requested that you have a carotid duplex. This test is an ultrasound of the carotid arteries in your neck. It looks at blood flow through these arteries that supply the brain with blood. Allow one hour for this exam. There are no restrictions or special instructions.  INR TO BE CHECKED IN 5 DAYS (06/25/13)  INR TO BE CHECKED WEEKLY FOR POSSIBLE CARDIOVERSION(DCCV)  KEEP FOLLOW UP APPOINTMENT WITH DR.CRENSHAW 06/2013

## 2013-06-20 ENCOUNTER — Telehealth: Payer: Self-pay | Admitting: Cardiology

## 2013-06-20 ENCOUNTER — Telehealth: Payer: Self-pay | Admitting: *Deleted

## 2013-06-20 MED ORDER — ASPIRIN EC 81 MG PO TBEC: 81.0000 mg | DELAYED_RELEASE_TABLET | Freq: Every day | ORAL | Status: DC

## 2013-06-20 NOTE — Telephone Encounter (Signed)
Spoke with pt, aware of lab results. He questioned if he should take the aspirin with the coumadin. The pt was instructed to stop the aspirin.

## 2013-06-20 NOTE — Telephone Encounter (Signed)
New problem   Lab results

## 2013-06-20 NOTE — Telephone Encounter (Signed)
Discussed aspirin with scott weaver pac, pt to cont aspirin until theraputic with his coumadin. Patient voiced understanding

## 2013-06-25 ENCOUNTER — Ambulatory Visit (INDEPENDENT_AMBULATORY_CARE_PROVIDER_SITE_OTHER): Payer: Medicare Other

## 2013-06-25 ENCOUNTER — Telehealth: Payer: Self-pay | Admitting: *Deleted

## 2013-06-25 DIAGNOSIS — Z7901 Long term (current) use of anticoagulants: Secondary | ICD-10-CM

## 2013-06-25 DIAGNOSIS — I4892 Unspecified atrial flutter: Secondary | ICD-10-CM | POA: Insufficient documentation

## 2013-06-25 LAB — POCT INR: INR: 1.4

## 2013-06-25 NOTE — Telephone Encounter (Signed)
Called pt and left a voice mail for him to call back and schedule his Carotid.

## 2013-06-25 NOTE — Patient Instructions (Signed)

## 2013-06-28 ENCOUNTER — Inpatient Hospital Stay (HOSPITAL_COMMUNITY): Payer: Medicare Other

## 2013-06-28 ENCOUNTER — Emergency Department (HOSPITAL_COMMUNITY): Payer: Medicare Other

## 2013-06-28 ENCOUNTER — Inpatient Hospital Stay (HOSPITAL_COMMUNITY)
Admission: EM | Admit: 2013-06-28 | Discharge: 2013-07-06 | DRG: 637 | Disposition: A | Discharge status: Skilled Nursing Facility | Payer: Medicare Other | Attending: Internal Medicine | Admitting: Internal Medicine

## 2013-06-28 ENCOUNTER — Encounter (HOSPITAL_COMMUNITY): Payer: Self-pay

## 2013-06-28 ENCOUNTER — Encounter (HOSPITAL_COMMUNITY): Payer: Self-pay | Admitting: Anesthesiology

## 2013-06-28 ENCOUNTER — Emergency Department (HOSPITAL_COMMUNITY): Payer: Medicare Other | Admitting: Anesthesiology

## 2013-06-28 DIAGNOSIS — Z860100 Personal history of colon polyps, unspecified: Secondary | ICD-10-CM

## 2013-06-28 DIAGNOSIS — Z79899 Other long term (current) drug therapy: Secondary | ICD-10-CM

## 2013-06-28 DIAGNOSIS — R0600 Dyspnea, unspecified: Secondary | ICD-10-CM

## 2013-06-28 DIAGNOSIS — K5730 Diverticulosis of large intestine without perforation or abscess without bleeding: Secondary | ICD-10-CM | POA: Diagnosis present

## 2013-06-28 DIAGNOSIS — D638 Anemia in other chronic diseases classified elsewhere: Secondary | ICD-10-CM | POA: Diagnosis present

## 2013-06-28 DIAGNOSIS — I6529 Occlusion and stenosis of unspecified carotid artery: Secondary | ICD-10-CM | POA: Diagnosis present

## 2013-06-28 DIAGNOSIS — G9340 Encephalopathy, unspecified: Secondary | ICD-10-CM

## 2013-06-28 DIAGNOSIS — E119 Type 2 diabetes mellitus without complications: Secondary | ICD-10-CM

## 2013-06-28 DIAGNOSIS — Z951 Presence of aortocoronary bypass graft: Secondary | ICD-10-CM

## 2013-06-28 DIAGNOSIS — I2589 Other forms of chronic ischemic heart disease: Secondary | ICD-10-CM

## 2013-06-28 DIAGNOSIS — I672 Cerebral atherosclerosis: Secondary | ICD-10-CM | POA: Diagnosis present

## 2013-06-28 DIAGNOSIS — I5032 Chronic diastolic (congestive) heart failure: Secondary | ICD-10-CM

## 2013-06-28 DIAGNOSIS — Z7901 Long term (current) use of anticoagulants: Secondary | ICD-10-CM

## 2013-06-28 DIAGNOSIS — E162 Hypoglycemia, unspecified: Secondary | ICD-10-CM

## 2013-06-28 DIAGNOSIS — N289 Disorder of kidney and ureter, unspecified: Secondary | ICD-10-CM

## 2013-06-28 DIAGNOSIS — D649 Anemia, unspecified: Secondary | ICD-10-CM

## 2013-06-28 DIAGNOSIS — R609 Edema, unspecified: Secondary | ICD-10-CM | POA: Diagnosis present

## 2013-06-28 DIAGNOSIS — I2721 Secondary pulmonary arterial hypertension: Secondary | ICD-10-CM

## 2013-06-28 DIAGNOSIS — J9600 Acute respiratory failure, unspecified whether with hypoxia or hypercapnia: Secondary | ICD-10-CM

## 2013-06-28 DIAGNOSIS — Z794 Long term (current) use of insulin: Secondary | ICD-10-CM

## 2013-06-28 DIAGNOSIS — I5033 Acute on chronic diastolic (congestive) heart failure: Secondary | ICD-10-CM

## 2013-06-28 DIAGNOSIS — E118 Type 2 diabetes mellitus with unspecified complications: Secondary | ICD-10-CM | POA: Diagnosis present

## 2013-06-28 DIAGNOSIS — E785 Hyperlipidemia, unspecified: Secondary | ICD-10-CM | POA: Diagnosis present

## 2013-06-28 DIAGNOSIS — I2789 Other specified pulmonary heart diseases: Secondary | ICD-10-CM

## 2013-06-28 DIAGNOSIS — D472 Monoclonal gammopathy: Secondary | ICD-10-CM | POA: Diagnosis present

## 2013-06-28 DIAGNOSIS — N185 Chronic kidney disease, stage 5: Secondary | ICD-10-CM

## 2013-06-28 DIAGNOSIS — R4189 Other symptoms and signs involving cognitive functions and awareness: Secondary | ICD-10-CM | POA: Diagnosis present

## 2013-06-28 DIAGNOSIS — E1149 Type 2 diabetes mellitus with other diabetic neurological complication: Secondary | ICD-10-CM

## 2013-06-28 DIAGNOSIS — Z8679 Personal history of other diseases of the circulatory system: Secondary | ICD-10-CM

## 2013-06-28 DIAGNOSIS — I4892 Unspecified atrial flutter: Secondary | ICD-10-CM

## 2013-06-28 DIAGNOSIS — N2581 Secondary hyperparathyroidism of renal origin: Secondary | ICD-10-CM | POA: Diagnosis present

## 2013-06-28 DIAGNOSIS — E1169 Type 2 diabetes mellitus with other specified complication: Principal | ICD-10-CM | POA: Diagnosis present

## 2013-06-28 DIAGNOSIS — I2510 Atherosclerotic heart disease of native coronary artery without angina pectoris: Secondary | ICD-10-CM

## 2013-06-28 DIAGNOSIS — R220 Localized swelling, mass and lump, head: Secondary | ICD-10-CM | POA: Diagnosis present

## 2013-06-28 DIAGNOSIS — N189 Chronic kidney disease, unspecified: Secondary | ICD-10-CM

## 2013-06-28 DIAGNOSIS — Z7982 Long term (current) use of aspirin: Secondary | ICD-10-CM

## 2013-06-28 DIAGNOSIS — I959 Hypotension, unspecified: Secondary | ICD-10-CM | POA: Diagnosis present

## 2013-06-28 DIAGNOSIS — Z8673 Personal history of transient ischemic attack (TIA), and cerebral infarction without residual deficits: Secondary | ICD-10-CM

## 2013-06-28 DIAGNOSIS — Z87891 Personal history of nicotine dependence: Secondary | ICD-10-CM

## 2013-06-28 DIAGNOSIS — I509 Heart failure, unspecified: Secondary | ICD-10-CM | POA: Diagnosis present

## 2013-06-28 DIAGNOSIS — K219 Gastro-esophageal reflux disease without esophagitis: Secondary | ICD-10-CM | POA: Diagnosis present

## 2013-06-28 DIAGNOSIS — D509 Iron deficiency anemia, unspecified: Secondary | ICD-10-CM | POA: Diagnosis present

## 2013-06-28 DIAGNOSIS — N184 Chronic kidney disease, stage 4 (severe): Secondary | ICD-10-CM

## 2013-06-28 DIAGNOSIS — I10 Essential (primary) hypertension: Secondary | ICD-10-CM

## 2013-06-28 LAB — URINE MICROSCOPIC-ADD ON

## 2013-06-28 LAB — CBC WITH DIFFERENTIAL/PLATELET
Basophils Absolute: 0 10*3/uL (ref 0.0–0.1)
Basophils Relative: 0 % (ref 0–1)
Eosinophils Relative: 0 % (ref 0–5)
HCT: 32 % — ABNORMAL LOW (ref 39.0–52.0)
Lymphocytes Relative: 18 % (ref 12–46)
Lymphs Abs: 1.4 10*3/uL (ref 0.7–4.0)
MCH: 29.3 pg (ref 26.0–34.0)
MCHC: 33.4 g/dL (ref 30.0–36.0)
MCV: 87.7 fL (ref 78.0–100.0)
Neutro Abs: 6.2 10*3/uL (ref 1.7–7.7)
Platelets: 159 10*3/uL (ref 150–400)
RBC: 3.65 MIL/uL — ABNORMAL LOW (ref 4.22–5.81)
RDW: 15.4 % (ref 11.5–15.5)
WBC: 7.9 10*3/uL (ref 4.0–10.5)

## 2013-06-28 LAB — URINALYSIS, ROUTINE W REFLEX MICROSCOPIC
Bilirubin Urine: NEGATIVE
Ketones, ur: NEGATIVE mg/dL
Nitrite: NEGATIVE
Protein, ur: 100 mg/dL — AB
Specific Gravity, Urine: 1.013 (ref 1.005–1.030)
Urobilinogen, UA: 0.2 mg/dL (ref 0.0–1.0)

## 2013-06-28 LAB — PROTIME-INR
INR: 1.71 — ABNORMAL HIGH (ref 0.00–1.49)
Prothrombin Time: 19.6 seconds — ABNORMAL HIGH (ref 11.6–15.2)

## 2013-06-28 LAB — POCT I-STAT 3, ART BLOOD GAS (G3+)
Patient temperature: 98.6
pCO2 arterial: 33.6 mmHg — ABNORMAL LOW (ref 35.0–45.0)
pH, Arterial: 7.415 (ref 7.350–7.450)

## 2013-06-28 LAB — COMPREHENSIVE METABOLIC PANEL
ALT: 60 U/L — ABNORMAL HIGH (ref 0–53)
AST: 54 U/L — ABNORMAL HIGH (ref 0–37)
Alkaline Phosphatase: 101 U/L (ref 39–117)
BUN: 44 mg/dL — ABNORMAL HIGH (ref 6–23)
CO2: 23 mEq/L (ref 19–32)
Calcium: 9.3 mg/dL (ref 8.4–10.5)
Chloride: 106 mEq/L (ref 96–112)
GFR calc Af Amer: 28 mL/min — ABNORMAL LOW (ref >90)
GFR calc non Af Amer: 24 mL/min — ABNORMAL LOW (ref >90)
Glucose, Bld: 124 mg/dL — ABNORMAL HIGH (ref 70–99)
Sodium: 143 mEq/L (ref 135–145)
Total Bilirubin: 0.6 mg/dL (ref 0.3–1.2)

## 2013-06-28 LAB — CK: Total CK: 291 U/L — ABNORMAL HIGH (ref 7–232)

## 2013-06-28 LAB — PRO B NATRIURETIC PEPTIDE: Pro B Natriuretic peptide (BNP): 4540 pg/mL — ABNORMAL HIGH (ref 0–450)

## 2013-06-28 LAB — PROCALCITONIN: Procalcitonin: 0.47 ng/mL

## 2013-06-28 LAB — GLUCOSE, CAPILLARY: Glucose-Capillary: 132 mg/dL — ABNORMAL HIGH (ref 70–99)

## 2013-06-28 MED ORDER — METHYLPREDNISOLONE SODIUM SUCC 125 MG IJ SOLR
INTRAMUSCULAR | Status: AC
  Administered 2013-06-28: 125 mg via INTRAVENOUS
  Filled 2013-06-28: qty 2

## 2013-06-28 MED ORDER — SODIUM CHLORIDE 0.9 % IV SOLN
1000.0000 mL | Freq: Once | INTRAVENOUS | Status: AC
  Administered 2013-06-28: 1000 mL via INTRAVENOUS

## 2013-06-28 MED ORDER — DEXTROSE 10 % IV SOLN
INTRAVENOUS | Status: DC
  Administered 2013-06-28: via INTRAVENOUS

## 2013-06-28 MED ORDER — METHYLPREDNISOLONE SODIUM SUCC 125 MG IJ SOLR
125.0000 mg | Freq: Once | INTRAMUSCULAR | Status: AC
  Administered 2013-06-28: 125 mg via INTRAVENOUS

## 2013-06-28 MED ORDER — FAMOTIDINE IN NACL 20-0.9 MG/50ML-% IV SOLN
20.0000 mg | Freq: Once | INTRAVENOUS | Status: AC
  Administered 2013-06-28: 20 mg via INTRAVENOUS
  Filled 2013-06-28: qty 50

## 2013-06-28 MED ORDER — PANTOPRAZOLE SODIUM 40 MG IV SOLR
40.0000 mg | INTRAVENOUS | Status: DC
  Administered 2013-06-28: 40 mg via INTRAVENOUS
  Filled 2013-06-28: qty 40

## 2013-06-28 MED ORDER — DIPHENHYDRAMINE HCL 50 MG/ML IJ SOLN
25.0000 mg | Freq: Once | INTRAMUSCULAR | Status: AC
  Administered 2013-06-28: 25 mg via INTRAVENOUS

## 2013-06-28 MED ORDER — FENTANYL CITRATE 0.05 MG/ML IJ SOLN
INTRAMUSCULAR | Status: AC
  Filled 2013-06-28: qty 2

## 2013-06-28 MED ORDER — MIDAZOLAM HCL 2 MG/2ML IJ SOLN
INTRAMUSCULAR | Status: AC
  Administered 2013-06-28: 2 mg
  Filled 2013-06-28: qty 2

## 2013-06-28 MED ORDER — MIDAZOLAM HCL 2 MG/2ML IJ SOLN: 1.0000 mg | INTRAMUSCULAR | Status: DC | PRN

## 2013-06-28 MED ORDER — PIPERACILLIN-TAZOBACTAM 3.375 G IVPB 30 MIN
3.3750 g | Freq: Once | INTRAVENOUS | Status: AC
  Administered 2013-06-28: 3.375 g via INTRAVENOUS

## 2013-06-28 MED ORDER — CHLORHEXIDINE GLUCONATE 0.12 % MT SOLN
15.0000 mL | Freq: Two times a day (BID) | OROMUCOSAL | Status: DC
  Administered 2013-06-28 – 2013-06-29 (×2): 15 mL via OROMUCOSAL
  Filled 2013-06-28 (×2): qty 15

## 2013-06-28 MED ORDER — PROPOFOL 10 MG/ML IV EMUL
INTRAVENOUS | Status: AC
  Administered 2013-06-28: 40 ug/kg/min
  Filled 2013-06-28: qty 100

## 2013-06-28 MED ORDER — PROPOFOL 10 MG/ML IV EMUL
INTRAVENOUS | Status: AC
  Administered 2013-06-28: 1000 mg
  Filled 2013-06-28: qty 100

## 2013-06-28 MED ORDER — VANCOMYCIN HCL IN DEXTROSE 1-5 GM/200ML-% IV SOLN
1000.0000 mg | Freq: Once | INTRAVENOUS | Status: AC
  Administered 2013-06-28: 1000 mg via INTRAVENOUS

## 2013-06-28 MED ORDER — PIPERACILLIN-TAZOBACTAM 3.375 G IVPB 30 MIN
3.3750 g | Freq: Once | INTRAVENOUS | Status: DC
  Filled 2013-06-28: qty 50

## 2013-06-28 MED ORDER — SODIUM CHLORIDE 0.9 % IV SOLN
1000.0000 mL | INTRAVENOUS | Status: DC
  Administered 2013-06-28: 1000 mL via INTRAVENOUS

## 2013-06-28 MED ORDER — BIOTENE DRY MOUTH MT LIQD
15.0000 mL | Freq: Four times a day (QID) | OROMUCOSAL | Status: DC
  Administered 2013-06-29 (×4): 15 mL via OROMUCOSAL

## 2013-06-28 MED ORDER — PANTOPRAZOLE SODIUM 40 MG IV SOLR
40.0000 mg | Freq: Every day | INTRAVENOUS | Status: DC
  Administered 2013-06-28: 40 mg via INTRAVENOUS
  Filled 2013-06-28: qty 40

## 2013-06-28 MED ORDER — DEXTROSE 50 % IV SOLN
INTRAVENOUS | Status: AC
  Filled 2013-06-28: qty 50

## 2013-06-28 MED ORDER — SODIUM CHLORIDE 0.9 % IV BOLUS (SEPSIS)
1000.0000 mL | Freq: Once | INTRAVENOUS | Status: DC
  Administered 2013-06-28: 1000 mL via INTRAVENOUS

## 2013-06-28 MED ORDER — PIPERACILLIN-TAZOBACTAM 3.375 G IVPB
3.3750 g | Freq: Three times a day (TID) | INTRAVENOUS | Status: DC
  Administered 2013-06-29: 3.375 g via INTRAVENOUS
  Filled 2013-06-28 (×3): qty 50

## 2013-06-28 MED ORDER — DEXTROSE-NACL 5-0.45 % IV SOLN
INTRAVENOUS | Status: DC
  Administered 2013-06-28: 21:00:00 via INTRAVENOUS

## 2013-06-28 MED ORDER — VANCOMYCIN HCL IN DEXTROSE 1-5 GM/200ML-% IV SOLN
1000.0000 mg | Freq: Once | INTRAVENOUS | Status: DC
  Filled 2013-06-28: qty 200

## 2013-06-28 MED ORDER — DEXTROSE 50 % IV SOLN
25.0000 mL | Freq: Once | INTRAVENOUS | Status: AC | PRN
  Administered 2013-06-28: 25 mL via INTRAVENOUS

## 2013-06-28 MED ORDER — FENTANYL CITRATE 0.05 MG/ML IJ SOLN: 25.0000 ug | INTRAMUSCULAR | Status: DC | PRN

## 2013-06-28 MED ORDER — DIPHENHYDRAMINE HCL 50 MG/ML IJ SOLN
INTRAMUSCULAR | Status: AC
  Administered 2013-06-28: 25 mg via INTRAVENOUS
  Filled 2013-06-28: qty 1

## 2013-06-28 MED ORDER — VANCOMYCIN HCL IN DEXTROSE 1-5 GM/200ML-% IV SOLN
1000.0000 mg | INTRAVENOUS | Status: DC
  Filled 2013-06-28: qty 200

## 2013-06-28 NOTE — ED Notes (Signed)
Pt arrives via EMS due to be ing unresponsive and found at his house.  Pt lives alone and unsure how long he had been down for.  Pts BS was 26 with first EMS check and Given D10 and their last check was 106.  Pt has Right leg drilled and Left EJ

## 2013-06-28 NOTE — H&P (Addendum)
PULMONARY  / CRITICAL CARE MEDICINE  Name: [name] MRN: [id number] DOB: [birth year]    ADMISSION DATE:  06/28/2013 CONSULTATION DATE:  06/28/2013  REFERRING MD :  EDP PRIMARY SERVICE:  PCCM  CHIEF COMPLAINT:  Acute encephalopathy  BRIEF PATIENT DESCRIPTION: 79 yo with multiple comorbidities brought to [facility] ED after being found down, hypoglycemic, with facial swelling.  Intubated for airway protection.  SIGNIFICANT EVENTS / STUDIES:  10/2  Head CT >>> NAICP  LINES / TUBES: OETT 10/2 >>> OGT 10/2 >>> Foley 10/2 >>>  CULTURES: 10/2 Blood >>> 10/2 Urine >>>  ANTIBIOTICS: Zosyn 10/2 >>> Vancomycin 10/2 >>>  The patient is encephalopathic and unable to provide history, which was obtained for available medical records.  HISTORY OF PRESENT ILLNESS:  79 yo with multiple comorbidities brought to [facility] ED after being found down, hypoglycemic, with facial swelling.  Intubated for airway protection.  Per ED staff, he was found down by his son on 10/2 lying on his face.  He had been last seen normal on 10/1 in the evening.  He was noted to be hypoglycemic by EMS (glucose 20) but was still encephalopathic despite receiving glucose, so he was intubated in the ED for airway protection.    PAST MEDICAL HISTORY :  Past Medical History  Diagnosis Date  . CAD (coronary artery disease)     s/p 3v CABG 2006, myoview 04/2010 EF 49%, prior inferior/apical infarct, no ischemia, LHC 11/2011 stable anatomy (occluded LAD filled from vein graft, distal LCx occluded prior to OM2, OM2 filled from vein graft, RCA prox 25%, mid 40%, distal 25% lesions, SVG to diagonal occluded) Med Rx  . Chronic diastolic heart failure     hx of cardiorenal syndrome;  Echo 06/2012 EF 50-55%, mod diastolic dysfunction, mild MR, mod TR, mild R/LAE, mild RV dilatation, PASP .  . Carotid stenosis     a. Carotid dopplers R 40-59%, Left 60-79%;   b. carotids 12/13:  0-39% RICA, 60-79% LICA (rpt in 6 mos)  . HLD  (hyperlipidemia)   . Mediastinal adenopathy     per CT chest in 2006 with PET scan showing very limited metabolic activity; not felt to have a significant neoplastic potential  . Iron deficiency anemia   . Hypertension   . Diabetes mellitus     insulin dependent   . GERD (gastroesophageal reflux disease)   . AV block, Mobitz 1     Noted 11/2011 in hospital, BB stopped  . Colon polyps   . Diverticulosis   . Pulmonary arterial hypertension     a.  RHC 11/1 PASP 60 mmHg (mean 38); b. RHC 11/15 PA pressure 66/24, PCWP 26 and CO 6.2 => Sildenafil started;   c. Echo bubble study 11/13:  no obvious shunt;   d. VQ scan neg for pulmonary embolism  . CKD (chronic kidney disease) stage 5, GFR less than 15 ml/min     hx of cardiorenal syndrome;  Dr Allena Katz;  AVF recommended and scheduled (patient hesitant to proceed);   Renal US 11/13: diff echogenic kidneys c/w medical renal disease; no hydronephrosis or renal mass  . Hyperparathyroidism, secondary renal   . MGUS (monoclonal gammopathy of unknown significance)     a. Kappa/Lambda free light chain ratio 11/13:  23.99;  b. Met. Bone survey 11/13:  no osteolytic lesions to suggest MMyeloma  . Atrial flutter     diagnosed 05/2013; Coumadin initiated   Past Surgical History  Procedure Laterality Date  . Appendectomy    .  Coronary artery bypass graft  2006    SVG to OM2, SVG to LAD, SVG to DX; (the LIMA did not have good flow and therefore was not used)  . Cardiac catheterization  12/21/2011   Prior to Admission medications   Medication Sig Start Date End Date Taking? Authorizing Provider  amLODipine (NORVASC) 10 MG tablet Take 10 mg by mouth daily.    Historical Provider, MD  aspirin EC 81 MG tablet Take 1 tablet (81 mg total) by mouth daily. 06/20/13   Beatrice Lecher, PA-C  atorvastatin (LIPITOR) 20 MG tablet Take 1 tablet (20 mg total) by mouth at bedtime. 04/10/13   Lewayne Bunting, MD  carvedilol (COREG) 6.25 MG tablet Take 6.25 mg by mouth 2 (two)  times daily.    Historical Provider, MD  furosemide (LASIX) 80 MG tablet Take 240 mg by mouth 3 (three) times daily.  08/20/12   Jessica A Hope, PA-C  insulin detemir (LEVEMIR) 100 UNIT/ML injection Inject 30 Units into the skin at bedtime. 10/04/12   Jacques Navy, MD  potassium chloride SA (K-DUR,KLOR-CON) 20 MEQ tablet Take 1 tablet (20 mEq total) by mouth 3 (three) times daily. 01/29/13   Lewayne Bunting, MD  warfarin (COUMADIN) 5 MG tablet Take 1 tablet (5 mg total) by mouth daily. Take 5mg  Daily except.Saturday and Sunday take 2.5mg  06/19/13   Beatrice Lecher, PA-C   No Known Allergies  FAMILY HISTORY: SOCIAL HISTORY:REVIEW OF SYSTEMS:  Unable to provide.  INTERVAL HISTORY:  VITAL SIGNS: Temp:  [92.5 F (33.6 C)-93.2 F (34 C)] 93 F (33.9 C) (10/02 2115) Pulse Rate:  [61-87] 63 (10/02 2115) Resp:  [13-39] 13 (10/02 2115) BP: (135-183)/(65-110) 135/65 mmHg (10/02 2115) SpO2:  [96 %-100 %] 100 % (10/02 2115) FiO2 (%):  [40 %] 40 % (10/02 2114) Weight:  [97.523 kg (215 lb)] 97.523 kg (215 lb) (10/02 2022)  HEMODYNAMICS:   VENTILATOR SETTINGS: Vent Mode:  [-] PRVC FiO2 (%):  [40 %] 40 % Set Rate:  [20 bmp] 20 bmp Vt Set:  [640 mL] 640 mL PEEP:  [5 cmH20] 5 cmH20 Plateau Pressure:  [22 cmH20] 22 cmH20 INTAKE / OUTPUT: Intake/Output   None     PHYSICAL EXAMINATION:  Gen: chronically ill appearing, intubated HEENT: facial edema, ETT in place, pupils pinpoint, minimally reactive to light PULM: few rhonchi bilaterally CV: Irreg irreg, no mgr, cannot assess JVD AB: BS+, soft, nontender, no hsm Ext: warm, some leg edema, no clubbing, no cyanosis Derm: pressure ulcers R shoulder, bilateral knees all anterior aspect Neuro: sedated on vent, does not open eyes, withdraws to pain, on propofol   LABS:  Recent Labs Lab 06/25/13 1147 06/28/13 2103 06/28/13 2104 06/28/13 2106  HGB  --   --  10.7*  --   WBC  --   --  7.9  --   PLT  --   --  159  --   INR 1.4  --   --   1.71*  LATICACIDVEN  --  1.51  --   --     Recent Labs Lab 06/28/13 2016  GLUCAP 132*   CXR:  10/2 >>> High ETT, increased interstitial markings  ASSESSMENT / PLAN:  PULMONARY A:  Acute respiratory failure in setting of inability to protect airway. H/o Pulmonary hypertension. Facial edema> dependent from being down, no laryngeal edema to suggest angioedema P:   Gaol SpO2>92, pH>7.30 Full mechanical support Advance ETT Daily SBT Trend ABG / CXR  CARDIOVASCULAR  A: Initially hypotensive, now normotensive.  H/o CAD, HTN, atrial flutter, chronic diastolic CHF. P:  Goal MAP>60 ASA, Norvasc, Lipitor, Coreg when able Avoid too much volume resuscitation Place CVL if hypotensive  RENAL A:  CKD.  Hypovolemia? P:   Trend BMP NS 1000 x 1 in ED Hold Lasix  GASTROINTESTINAL A:  No active issues, P:   NPO as intubated TF if remains intubated > 24 hours Protonix for GI Px  HEMATOLOGIC A:  Chronic anticoagulation with Coumadin (for AF?) P:  Trend CBC SCDs for DVT Px Would hold Coumadin for now  INFECTIOUS A:  No overt infectious source, but sepsis suspected. P:   Cultures and antibiotics as above PCT  ENDOCRINE  A:   DM2.   Hypoglycemia > now improved P:   SSI D10@50   NEUROLOGIC A:  Acute metabolic encephalopathy. 10/2 Head CT negative P:   Goal RASS 0 to -1 Fentanyl gtt Versed PRN Daily WUA  I have personally obtained history, examined patient, evaluated and interpreted laboratory and imaging results, reviewed medical records, formulated assessment / plan and placed orders.  CRITICAL CARE:  The patient is critically ill with multiple organ systems failure and requires high complexity decision making for assessment and support, frequent evaluation and titration of therapies, application of advanced monitoring technologies and extensive interpretation of multiple databases. Critical Care Time devoted to patient care services described in this note is 45  minutes.   Max Fickle, MD Pulmonary and Critical Care Medicine [facility] Pager: [phone]  06/28/2013, 9:44 PM

## 2013-06-28 NOTE — Op Note (Signed)
Asked by Dr Wilkie Aye to emergently intubate patient for facial edema. Chart reviewed, patient identified, Awake Glidescope intubation performed by myself assisted by Brien Mates CRNA.  Good visualization of the larynx without edema. A number 7.5 subglottic tube, taped at 24cm at the lip.  Good bilateral breath sounds heard, ETCO2 positive.  Airway to RT, sedation per attending physician. Start: 2040 End: 2050 Arta Bruce, MD

## 2013-06-28 NOTE — Telephone Encounter (Signed)
appt 07-09-13 for carotid

## 2013-06-28 NOTE — Progress Notes (Signed)
ANTIBIOTIC CONSULT NOTE - INITIAL  Pharmacy Consult for vanc + zosyn Indication: rule out sepsis  No Known Allergies  Patient Measurements: Height: 6\' 1"  (185.4 cm) Weight: 215 lb (97.523 kg) IBW/kg (Calculated) : 79.9 Adjusted Body Weight:   Vital Signs: Temp: 92.1 F (33.4 C) (10/02 2200) Temp src: Rectal (10/02 2036) BP: 130/71 mmHg (10/02 2200) Pulse Rate: 63 (10/02 2200) Intake/Output from previous day:   Intake/Output from this shift:    Labs:  Recent Labs  06/28/13 2104  WBC 7.9  HGB 10.7*  PLT 159  CREATININE 2.40*   Estimated Creatinine Clearance: 30.2 ml/min (by C-G formula based on Cr of 2.4). No results found for this basename: VANCOTROUGH, VANCOPEAK, VANCORANDOM, GENTTROUGH, GENTPEAK, GENTRANDOM, TOBRATROUGH, TOBRAPEAK, TOBRARND, AMIKACINPEAK, AMIKACINTROU, AMIKACIN,  in the last 72 hours   Microbiology: No results found for this or any previous visit (from the past 720 hour(s)).  Medical History: Past Medical History  Diagnosis Date  . CAD (coronary artery disease)     s/p 3v CABG 2006, myoview 04/2010 EF 49%, prior inferior/apical infarct, no ischemia, LHC 11/2011 stable anatomy (occluded LAD filled from vein graft, distal LCx occluded prior to OM2, OM2 filled from vein graft, RCA prox 25%, mid 40%, distal 25% lesions, SVG to diagonal occluded) Med Rx  . Chronic diastolic heart failure     hx of cardiorenal syndrome;  Echo 06/2012 EF 50-55%, mod diastolic dysfunction, mild MR, mod TR, mild R/LAE, mild RV dilatation, PASP .  . Carotid stenosis     a. Carotid dopplers R 40-59%, Left 60-79%;   b. carotids 12/13:  0-39% RICA, 60-79% LICA (rpt in 6 mos)  . HLD (hyperlipidemia)   . Mediastinal adenopathy     per CT chest in 2006 with PET scan showing very limited metabolic activity; not felt to have a significant neoplastic potential  . Iron deficiency anemia   . Hypertension   . Diabetes mellitus     insulin dependent   . GERD (gastroesophageal  reflux disease)   . AV block, Mobitz 1     Noted 11/2011 in hospital, BB stopped  . Colon polyps   . Diverticulosis   . Pulmonary arterial hypertension     a.  RHC 11/1 PASP 60 mmHg (mean 38); b. RHC 11/15 PA pressure 66/24, PCWP 26 and CO 6.2 => Sildenafil started;   c. Echo bubble study 11/13:  no obvious shunt;   d. VQ scan neg for pulmonary embolism  . CKD (chronic kidney disease) stage 5, GFR less than 15 ml/min     hx of cardiorenal syndrome;  Dr Allena Katz;  AVF recommended and scheduled (patient hesitant to proceed);   Renal US 11/13: diff echogenic kidneys c/w medical renal disease; no hydronephrosis or renal mass  . Hyperparathyroidism, secondary renal   . MGUS (monoclonal gammopathy of unknown significance)     a. Kappa/Lambda free light chain ratio 11/13:  23.99;  b. Met. Bone survey 11/13:  no osteolytic lesions to suggest MMyeloma  . Atrial flutter     diagnosed 05/2013; Coumadin initiated    Medications:  Anti-infectives   Start     Dose/Rate Route Frequency Ordered Stop   06/29/13 2200  vancomycin (VANCOCIN) IVPB 1000 mg/200 mL premix     1,000 mg 200 mL/hr over 60 Minutes Intravenous Every 24 hours 06/28/13 2207     06/29/13 0500  piperacillin-tazobactam (ZOSYN) IVPB 3.375 g     3.375 g 12.5 mL/hr over 240 Minutes Intravenous Every 8 hours 06/28/13  2207     06/28/13 2215  piperacillin-tazobactam (ZOSYN) IVPB 3.375 g     3.375 g 100 mL/hr over 30 Minutes Intravenous  Once 06/28/13 2205     06/28/13 2215  vancomycin (VANCOCIN) IVPB 1000 mg/200 mL premix     1,000 mg 200 mL/hr over 60 Minutes Intravenous  Once 06/28/13 2205     06/28/13 2045  piperacillin-tazobactam (ZOSYN) IVPB 3.375 g  Status:  Discontinued     3.375 g 100 mL/hr over 30 Minutes Intravenous  Once 06/28/13 2039 06/28/13 2115   06/28/13 2045  vancomycin (VANCOCIN) IVPB 1000 mg/200 mL premix  Status:  Discontinued     1,000 mg 200 mL/hr over 60 Minutes Intravenous  Once 06/28/13 2039 06/28/13 2115      Assessment: 82 yom presented to the ED after being found down at home with significant facial edema. To start empiric vancomycin + zosyn for possible sepsis. Pt is hypothermic, WBC is WNL, LA is 1.51. Scr is elevated at 2.4 but does have a known history of CKD.   Goal of Therapy:  Vancomycin trough level 15-20 mcg/ml  Plan:  1. Vancomycin 1gm IV Q24H 2. Zosyn 3.375gm IV Q8H (4 hr inf) 3. F/u renal fxn, C&S, clinical status and trough at [facility]  Tiburcio Linder, Drake Leach 06/28/2013,10:08 PM

## 2013-06-29 ENCOUNTER — Encounter (HOSPITAL_COMMUNITY): Payer: Self-pay

## 2013-06-29 DIAGNOSIS — I509 Heart failure, unspecified: Secondary | ICD-10-CM

## 2013-06-29 DIAGNOSIS — I5033 Acute on chronic diastolic (congestive) heart failure: Secondary | ICD-10-CM

## 2013-06-29 LAB — PROCALCITONIN: Procalcitonin: 4.06 ng/mL

## 2013-06-29 LAB — GLUCOSE, CAPILLARY
Glucose-Capillary: 66 mg/dL — ABNORMAL LOW (ref 70–99)
Glucose-Capillary: 126 mg/dL — ABNORMAL HIGH (ref 70–99)
Glucose-Capillary: 180 mg/dL — ABNORMAL HIGH (ref 70–99)
Glucose-Capillary: 218 mg/dL — ABNORMAL HIGH (ref 70–99)
Glucose-Capillary: 220 mg/dL — ABNORMAL HIGH (ref 70–99)

## 2013-06-29 LAB — PROTIME-INR: INR: 1.99 — ABNORMAL HIGH (ref 0.00–1.49)

## 2013-06-29 MED ORDER — INFLUENZA VAC SPLIT QUAD 0.5 ML IM SUSP
0.5000 mL | INTRAMUSCULAR | Status: AC
  Administered 2013-06-30: 0.5 mL via INTRAMUSCULAR
  Filled 2013-06-29: qty 0.5

## 2013-06-29 MED ORDER — INSULIN ASPART 100 UNIT/ML ~~LOC~~ SOLN
0.0000 [IU] | Freq: Three times a day (TID) | SUBCUTANEOUS | Status: DC
  Administered 2013-06-29 – 2013-07-01 (×5): 3 [IU] via SUBCUTANEOUS
  Administered 2013-07-01: 09:00:00 2 [IU] via SUBCUTANEOUS
  Administered 2013-07-01: 3 [IU] via SUBCUTANEOUS
  Administered 2013-07-02: 1 [IU] via SUBCUTANEOUS
  Administered 2013-07-02 – 2013-07-05 (×2): 2 [IU] via SUBCUTANEOUS

## 2013-06-29 MED ORDER — BIOTENE DRY MOUTH MT LIQD
15.0000 mL | Freq: Two times a day (BID) | OROMUCOSAL | Status: DC
  Administered 2013-06-30 – 2013-07-06 (×10): 15 mL via OROMUCOSAL

## 2013-06-29 MED ORDER — ATORVASTATIN CALCIUM 20 MG PO TABS
20.0000 mg | ORAL_TABLET | Freq: Every day | ORAL | Status: DC
  Administered 2013-06-29 – 2013-07-05 (×7): 20 mg via ORAL
  Filled 2013-06-29 (×8): qty 1

## 2013-06-29 MED ORDER — PANTOPRAZOLE SODIUM 40 MG PO TBEC
40.0000 mg | DELAYED_RELEASE_TABLET | Freq: Every day | ORAL | Status: DC
  Administered 2013-06-29: 40 mg via ORAL
  Filled 2013-06-29: qty 1

## 2013-06-29 MED ORDER — WARFARIN - PHARMACIST DOSING INPATIENT: Freq: Every day | Status: DC

## 2013-06-29 MED ORDER — DEXTROSE-NACL 5-0.45 % IV SOLN
INTRAVENOUS | Status: DC
  Administered 2013-06-29: 11:00:00 via INTRAVENOUS

## 2013-06-29 MED ORDER — PNEUMOCOCCAL VAC POLYVALENT 25 MCG/0.5ML IJ INJ
0.5000 mL | INJECTION | INTRAMUSCULAR | Status: AC
  Administered 2013-06-30: 0.5 mL via INTRAMUSCULAR
  Filled 2013-06-29: qty 0.5

## 2013-06-29 MED ORDER — HYDRALAZINE HCL 20 MG/ML IJ SOLN
20.0000 mg | INTRAMUSCULAR | Status: DC | PRN
  Administered 2013-06-29 (×2): 20 mg via INTRAVENOUS
  Filled 2013-06-29 (×2): qty 1

## 2013-06-29 MED ORDER — FENTANYL BOLUS VIA INFUSION
25.0000 ug | Freq: Four times a day (QID) | INTRAVENOUS | Status: DC | PRN
  Filled 2013-06-29: qty 100

## 2013-06-29 MED ORDER — SODIUM CHLORIDE 0.9 % IV SOLN
25.0000 ug/h | INTRAVENOUS | Status: DC
  Administered 2013-06-29: 50 ug/h via INTRAVENOUS
  Filled 2013-06-29: qty 50

## 2013-06-29 MED ORDER — AMLODIPINE BESYLATE 10 MG PO TABS
10.0000 mg | ORAL_TABLET | Freq: Every day | ORAL | Status: DC
  Administered 2013-06-29 – 2013-07-06 (×8): 10 mg via ORAL
  Filled 2013-06-29 (×8): qty 1

## 2013-06-29 MED ORDER — ASPIRIN EC 81 MG PO TBEC
81.0000 mg | DELAYED_RELEASE_TABLET | Freq: Every day | ORAL | Status: DC
  Administered 2013-06-29 – 2013-07-06 (×8): 81 mg via ORAL
  Filled 2013-06-29 (×8): qty 1

## 2013-06-29 MED ORDER — WARFARIN SODIUM 2.5 MG PO TABS
2.5000 mg | ORAL_TABLET | Freq: Once | ORAL | Status: AC
  Administered 2013-06-29: 2.5 mg via ORAL
  Filled 2013-06-29: qty 1

## 2013-06-29 MED ORDER — CARVEDILOL 6.25 MG PO TABS
6.2500 mg | ORAL_TABLET | Freq: Two times a day (BID) | ORAL | Status: DC
  Administered 2013-06-29 – 2013-07-06 (×15): 6.25 mg via ORAL
  Filled 2013-06-29 (×16): qty 1

## 2013-06-29 NOTE — ED Provider Notes (Signed)
I saw and evaluated the patient, reviewed the resident's note and I agree with the findings and plan.  79 yo male with history of diabetes last seen normal last night.  Found unreponsive in prone position.  Glucose of 26 on scene.  IO placed by EMS with D10 infusion.  Patient became arousal but still altered.  INitial vitals notable for hypertension.  Marked facial edema with lip swelling and tongue swelling noted.  Patient 100% on NRB but not following commands or handling secretions well.  Also noted to be hypothermic.  Sepsis w/u initiated.  Patient needed intubation for airway protection.  GIven extensive facial swelling, anesthesia was called. Awake intubation performed by anesthesia without evidence of laryngeal edema.  Patient discussed with critical care.  Level 5 caveat applies for AMS  CRITICAL CARE Performed by: Ross Marcus, F   Total critical care time: 40 min  Critical care time was exclusive of separately billable procedures and treating other patients.  Critical care was necessary to treat or prevent imminent or life-threatening deterioration.  Critical care was time spent personally by me on the following activities: development of treatment plan with patient and/or surrogate as well as nursing, discussions with consultants, evaluation of patient's response to treatment, examination of patient, obtaining history from patient or surrogate, ordering and performing treatments and interventions, ordering and review of laboratory studies, ordering and review of radiographic studies, pulse oximetry and re-evaluation of patient's condition.   Shon Baton, MD 06/29/13 0730

## 2013-06-29 NOTE — ED Provider Notes (Signed)
CSN: [id number]     Arrival date & time 06/28/13  2011 History   First MD Initiated Contact with Patient 06/28/13 2011     Chief Complaint  Patient presents with  . Hypoglycemia    Pt was found unresponsive   (Consider location/radiation/quality/duration/timing/severity/associated sxs/prior Treatment) Patient is a 79 y.o. male presenting with hypoglycemia and altered mental status.  Hypoglycemia Initial blood sugar:  26 Blood sugar after intervention:  106 Severity:  Moderate Onset quality:  Gradual Timing:  Constant Progression:  Partially resolved Chronicity:  New Diabetic status:  Controlled with insulin Current diabetic therapy:  Levemir Context: decreased oral intake   Associated symptoms: altered mental status and syncope   Associated symptoms: no vomiting   Altered Mental Status Presenting symptoms: confusion and unresponsiveness   Severity:  Severe Episode history:  Continuous Duration: last seen normal last night. Timing:  Constant Progression:  Unchanged Chronicity:  New Associated symptoms: no fever and no vomiting     Past Medical History  Diagnosis Date  . CAD (coronary artery disease)     s/p 3v CABG 2006, myoview 04/2010 EF 49%, prior inferior/apical infarct, no ischemia, LHC 11/2011 stable anatomy (occluded LAD filled from vein graft, distal LCx occluded prior to OM2, OM2 filled from vein graft, RCA prox 25%, mid 40%, distal 25% lesions, SVG to diagonal occluded) Med Rx  . Chronic diastolic heart failure     hx of cardiorenal syndrome;  Echo 06/2012 EF 50-55%, mod diastolic dysfunction, mild MR, mod TR, mild R/LAE, mild RV dilatation, PASP .  . Carotid stenosis     a. Carotid dopplers R 40-59%, Left 60-79%;   b. carotids 12/13:  0-39% RICA, 60-79% LICA (rpt in 6 mos)  . HLD (hyperlipidemia)   . Mediastinal adenopathy     per CT chest in 2006 with PET scan showing very limited metabolic activity; not felt to have a significant neoplastic potential  .  Iron deficiency anemia   . Hypertension   . Diabetes mellitus     insulin dependent   . GERD (gastroesophageal reflux disease)   . AV block, Mobitz 1     Noted 11/2011 in hospital, BB stopped  . Colon polyps   . Diverticulosis   . Pulmonary arterial hypertension     a.  RHC 11/1 PASP 60 mmHg (mean 38); b. RHC 11/15 PA pressure 66/24, PCWP 26 and CO 6.2 => Sildenafil started;   c. Echo bubble study 11/13:  no obvious shunt;   d. VQ scan neg for pulmonary embolism  . CKD (chronic kidney disease) stage 5, GFR less than 15 ml/min     hx of cardiorenal syndrome;  Dr Allena Katz;  AVF recommended and scheduled (patient hesitant to proceed);   Renal US 11/13: diff echogenic kidneys c/w medical renal disease; no hydronephrosis or renal mass  . Hyperparathyroidism, secondary renal   . MGUS (monoclonal gammopathy of unknown significance)     a. Kappa/Lambda free light chain ratio 11/13:  23.99;  b. Met. Bone survey 11/13:  no osteolytic lesions to suggest MMyeloma  . Atrial flutter     diagnosed 05/2013; Coumadin initiated   Past Surgical History  Procedure Laterality Date  . Appendectomy    . Coronary artery bypass graft  2006    SVG to OM2, SVG to LAD, SVG to DX; (the LIMA did not have good flow and therefore was not used)  . Cardiac catheterization  12/21/2011   Family History  Problem Relation Age of Onset  .  Stomach cancer Sister   . Pancreatic cancer Brother   . Lung cancer Brother   . Colon cancer Neg Hx    History  Substance Use Topics  . Smoking status: Former Smoker    Quit date: 09/27/1953  . Smokeless tobacco: Never Used  . Alcohol Use: No    Review of Systems  Unable to perform ROS: Mental status change  Constitutional: Negative for fever.  Cardiovascular: Positive for syncope.  Gastrointestinal: Negative for vomiting.  Psychiatric/Behavioral: Positive for confusion.    Allergies  Review of patient's allergies indicates no known allergies.  Home Medications  No current  outpatient prescriptions on file. BP 158/58  Pulse 79  Temp(Src) 97 F (36.1 C) (Core (Comment))  Resp 17  Ht 6\' 1"  (1.854 m)  Wt 215 lb (97.523 kg)  BMI 28.37 kg/m2  SpO2 96% Physical Exam  Vitals reviewed. Constitutional: He appears well-developed and well-nourished.  HENT:  Head: Atraumatic.  Diffuse facial swelling and proptosis  Eyes: Conjunctivae and EOM are normal.  Neck: Normal range of motion. Neck supple.  Cardiovascular: Normal rate, regular rhythm and normal heart sounds.   Pulmonary/Chest: Effort normal and breath sounds normal.  Abdominal: He exhibits no distension. There is no tenderness. There is no rebound and no guarding.  Musculoskeletal: Normal range of motion.  Neurological: He is unresponsive. GCS eye subscore is 1. GCS verbal subscore is 2. GCS motor subscore is 1.  MAE, initially with unresponsiveness, however improved to moaning  Skin: Skin is warm and dry.    ED Course  Procedures (including critical care time) Labs Review Labs Reviewed  GLUCOSE, CAPILLARY - Abnormal; Notable for the following:    Glucose-Capillary 132 (*)    All other components within normal limits  CBC WITH DIFFERENTIAL - Abnormal; Notable for the following:    RBC 3.65 (*)    Hemoglobin 10.7 (*)    HCT 32.0 (*)    Neutrophils Relative % 78 (*)    All other components within normal limits  COMPREHENSIVE METABOLIC PANEL - Abnormal; Notable for the following:    Glucose, Bld 124 (*)    BUN 44 (*)    Creatinine, Ser 2.40 (*)    AST 54 (*)    ALT 60 (*)    GFR calc non Af Amer 24 (*)    GFR calc Af Amer 28 (*)    All other components within normal limits  URINALYSIS, ROUTINE W REFLEX MICROSCOPIC - Abnormal; Notable for the following:    Hgb urine dipstick SMALL (*)    Protein, ur 100 (*)    All other components within normal limits  PRO B NATRIURETIC PEPTIDE - Abnormal; Notable for the following:    Pro B Natriuretic peptide (BNP) 4540.0 (*)    All other components  within normal limits  CK - Abnormal; Notable for the following:    Total CK 291 (*)    All other components within normal limits  PROTIME-INR - Abnormal; Notable for the following:    Prothrombin Time 19.6 (*)    INR 1.71 (*)    All other components within normal limits  GLUCOSE, CAPILLARY - Abnormal; Notable for the following:    Glucose-Capillary 66 (*)    All other components within normal limits  GLUCOSE, CAPILLARY - Abnormal; Notable for the following:    Glucose-Capillary 126 (*)    All other components within normal limits  POCT I-STAT 3, BLOOD GAS (G3+) - Abnormal; Notable for the following:  pCO2 arterial 33.6 (*)    pO2, Arterial 62.0 (*)    Acid-base deficit 3.0 (*)    All other components within normal limits  CULTURE, BLOOD (ROUTINE X 2)  CULTURE, BLOOD (ROUTINE X 2)  URINE CULTURE  MRSA PCR SCREENING  PROCALCITONIN  URINE MICROSCOPIC-ADD ON  BLOOD GAS, ARTERIAL  PROCALCITONIN  CG4 I-STAT (LACTIC ACID)   Imaging Review Ct Head Wo Contrast  06/28/2013   *RADIOLOGY REPORT*  Clinical Data: Unresponsive  CT HEAD WITHOUT CONTRAST  Technique:  Contiguous axial images were obtained from the base of the skull through the vertex without contrast.  Comparison: 07/24/2012  Findings: There is diffuse atrophy and there is moderate low attenuation in the deep white matter, similar to prior study. There is a tiny chronic appearing lacunar infarct in the left thalamus, stable.  There is no acute vascular territory infarct. There is no hemorrhage or extra-axial fluid.  There is mild to moderate chronic inflammatory change in the sphenoid, maxillary, frontal, and ethmoid air cells.  The calvarium is intact.  IMPRESSION: No acute findings   Original Report Authenticated By: Esperanza Heir, M.D.   Portable Chest Xray  06/28/2013   CLINICAL DATA:  Endotracheal tube placement.  EXAM: PORTABLE CHEST - 1 VIEW  COMPARISON:  06/28/2013  FINDINGS: The endotracheal tube is approximately 7 cm  above the carina. Slightly improved aeration status post intubation. Persistent edema and atelectasis.  IMPRESSION: Endotracheal tube is 7 cm above the carina.  Slight improved aeration post intubation.   Electronically Signed   By: Loralie Champagne M.D.   On: 06/28/2013 21:20   Dg Chest Portable 1 View  06/28/2013   CLINICAL DATA:  Altered mental status.  EXAM: PORTABLE CHEST - 1 VIEW  COMPARISON:  08/07/2012.  FINDINGS: The heart is mildly enlarged but stable. Stable surgical changes from triple bypass surgery. There is vascular congestion and mild interstitial it suggesting CHF. No pleural effusion.  IMPRESSION: Mild CHF.   Electronically Signed   By: Loralie Champagne M.D.   On: 06/28/2013 20:56    MDM   1. Acute encephalopathy   2. Acute respiratory failure   3. Atrial flutter   4. Chronic diastolic heart failure   5. Coronary atherosclerosis of unspecified type of vessel, native or graft   6. Hypoglycemia   7. Pulmonary arterial hypertension    79 y.o. male  with pertinent PMH of DM, CAD, dCHF, GERD, HTN, aflutter presents with unresponsiveness from home, last seen normal night before visit.  Pt found on floor today by EMS, supine, with signs of skin breakdown, significant facial swelling, found to have glucose 26, given d10 250, improvement to 106, however mental status change persisted.  On arrival physical exam as above.  Called anesthesia, who then intubated pt.  Critical care consulted, pt admitted.   Labs and imaging as above reviewed by myself and attending,Dr. Wilkie Aye, with whom case was discussed.   1. Acute encephalopathy   2. Acute respiratory failure   3. Atrial flutter   4. Chronic diastolic heart failure   5. Coronary atherosclerosis of unspecified type of vessel, native or graft   6. Hypoglycemia   7. Pulmonary arterial hypertension         Noel Gerold, MD 06/29/13 [phone]

## 2013-06-29 NOTE — Progress Notes (Signed)
Utilization review completed. Drayce Tawil, RN, BSN. 

## 2013-06-29 NOTE — Progress Notes (Signed)
PULMONARY  / CRITICAL CARE MEDICINE  Name: [name] MRN: [id number] DOB: 09/23/1931    ADMISSION DATE:  06/28/2013 CONSULTATION DATE:  06/28/2013  REFERRING MD :  EDP PRIMARY SERVICE:  PCCM  CHIEF COMPLAINT:  Acute encephalopathy  BRIEF PATIENT DESCRIPTION: 79 yo with multiple comorbidities brought to [facility] ED after being found down, hypoglycemic, with facial swelling.  Intubated for airway protection.  SIGNIFICANT EVENTS / STUDIES:  10/2  Head CT >>> NAICP  LINES / TUBES: OETT 10/2 >>>10/3  CULTURES: 10/2 Blood >>> 10/2 Urine >>>  ANTIBIOTICS: Zosyn 10/2 >>> Vancomycin 10/2 >>>  INTERVAL HISTORY: Tolerating SBT.  Denies chest/abd pain.  VITAL SIGNS: Temp:  [92.1 F (33.4 C)-98.4 F (36.9 C)] 98.4 F (36.9 C) (10/03 1000) Pulse Rate:  [51-87] 70 (10/03 1000) Resp:  [8-39] 11 (10/03 1000) BP: (127-193)/(51-110) 143/60 mmHg (10/03 1000) SpO2:  [90 %-100 %] 90 % (10/03 1000) FiO2 (%):  [40 %] 40 % (10/03 0900) Weight:  [196 lb 3.4 oz (89 kg)-215 lb (97.523 kg)] 196 lb 3.4 oz (89 kg) (10/03 0402) VENTILATOR SETTINGS: Vent Mode:  [-] CPAP;PSV FiO2 (%):  [40 %] 40 % Set Rate:  [20 bmp] 20 bmp Vt Set:  [640 mL] 640 mL PEEP:  [5 cmH20] 5 cmH20 Pressure Support:  [5 cmH20-8 cmH20] 8 cmH20 Plateau Pressure:  [22 cmH20-24 cmH20] 24 cmH20 INTAKE / OUTPUT: Intake/Output     10/02 0701 - 10/03 0700 10/03 0701 - 10/04 0700   I.V. (mL/kg) 537.4 (6) 131.3 (1.5)   IV Piggyback 250    Total Intake(mL/kg) 787.4 (8.8) 131.3 (1.5)   Urine (mL/kg/hr) 1375 200 (0.7)   Total Output 1375 200   Net -587.6 -68.8          PHYSICAL EXAMINATION:  Gen: no distress HEENT: no facial edema, ETT in place PULM: no wheeze CV: irregular, no murmur AB: BS+, soft, nontender Ext: minimal ankle edema Derm: pressure ulcers R shoulder, bilateral knees all anterior aspect Neuro: alert, follows commands, moves all extremities   LABS: CBC Recent Labs     06/28/13  2104  WBC  7.9   HGB  10.7*  HCT  32.0*  PLT  159    Coag's Recent Labs     06/28/13  2106  INR  1.71*    BMET Recent Labs     06/28/13  2104  NA  143  K  4.3  CL  106  CO2  23  BUN  44*  CREATININE  2.40*  GLUCOSE  124*    Electrolytes Recent Labs     06/28/13  2104  CALCIUM  9.3    Sepsis Markers Recent Labs     06/28/13  2245  06/29/13  0425  PROCALCITON  0.47  4.06    ABG Recent Labs     06/28/13  2208  PHART  7.415  PCO2ART  33.6*  PO2ART  62.0*    Liver Enzymes Recent Labs     06/28/13  2104  AST  54*  ALT  60*  ALKPHOS  101  BILITOT  0.6  ALBUMIN  4.0    Cardiac Enzymes Recent Labs     06/28/13  2105  PROBNP  4540.0*    Glucose Recent Labs     06/28/13  2016  06/28/13  2353  06/29/13  0020  06/29/13  0400  06/29/13  0736  GLUCAP  132*  66*  126*  126*  180*    Imaging Ct  Head Wo Contrast  06/28/2013   *RADIOLOGY REPORT*  Clinical Data: Unresponsive  CT HEAD WITHOUT CONTRAST  Technique:  Contiguous axial images were obtained from the base of the skull through the vertex without contrast.  Comparison: 07/24/2012  Findings: There is diffuse atrophy and there is moderate low attenuation in the deep white matter, similar to prior study. There is a tiny chronic appearing lacunar infarct in the left thalamus, stable.  There is no acute vascular territory infarct. There is no hemorrhage or extra-axial fluid.  There is mild to moderate chronic inflammatory change in the sphenoid, maxillary, frontal, and ethmoid air cells.  The calvarium is intact.  IMPRESSION: No acute findings   Original Report Authenticated By: Esperanza Heir, M.D.   Portable Chest Xray  06/28/2013   CLINICAL DATA:  Endotracheal tube placement.  EXAM: PORTABLE CHEST - 1 VIEW  COMPARISON:  06/28/2013  FINDINGS: The endotracheal tube is approximately 7 cm above the carina. Slightly improved aeration status post intubation. Persistent edema and atelectasis.  IMPRESSION: Endotracheal  tube is 7 cm above the carina.  Slight improved aeration post intubation.   Electronically Signed   By: Loralie Champagne M.D.   On: 06/28/2013 21:20   Dg Chest Portable 1 View  06/28/2013   CLINICAL DATA:  Altered mental status.  EXAM: PORTABLE CHEST - 1 VIEW  COMPARISON:  08/07/2012.  FINDINGS: The heart is mildly enlarged but stable. Stable surgical changes from triple bypass surgery. There is vascular congestion and mild interstitial it suggesting CHF. No pleural effusion.  IMPRESSION: Mild CHF.   Electronically Signed   By: Loralie Champagne M.D.   On: 06/28/2013 20:56       ASSESSMENT / PLAN:  PULMONARY A:   Acute respiratory failure 2nd to acute encephalopathy. Hx of Pulmonary HTN in setting of diastolic CHF. Facial edema noted on admission >> not evident 10/3. P:   -proceed with extubation 10/3 -bronchial hygiene -oxygen to keep SpO2 > 92%  CARDIOVASCULAR A:  Hypotension on admission >> resolved. Hx of CAD, hyperlipidemia, HTN, chronic diastolic CHF, a flutter. P:  -resume outpt cardiac meds -pharmacy to adjust coumadin  RENAL A:  Stage V CKD. P:   -even fluid balance for now >> hold outpt lasix for now -monitor renal fx, urine outpt, electrolytes  GASTROINTESTINAL A:   Nutrition. P:   -carb modified diet after extubation -protonix for SUP >> likely can d/c 10/04  HEMATOLOGIC A:   Anemia of chronic disease. P:  -f/u CBC  INFECTIOUS A:   No obvious source of infection. P:   -d/c Abx 10/03 and monitor  ENDOCRINE  A:   Hypoglycemia on admission >> improved. Hx of DM type II. P:   -change IV fluid to D5 1/2 NS at 30 ml/hr -SSI  NEUROLOGIC A:   Acute encephalopathy 2nd to hypoglycemia, hypotension, hypoxia >> resolved 10/3. P:   -monitor mental status  CC time 40 minutes.  Updated pt's family at bedside.  Coralyn Helling, MD [facility] Pulmonary/Critical Care 06/29/2013, 10:26 AM Pager:  [phone] After 3pm call: [phone]

## 2013-06-29 NOTE — Procedures (Signed)
Extubation Procedure Note  Patient Details:   Name: [name] DOB: Jan 28, 1931 MRN: [id number]   Airway Documentation:  Airway 7.5 mm (Active)  Secured at (cm) 24 cm 06/29/2013  8:27 AM  Measured From Lips 06/29/2013  8:27 AM  Secured Location Right 06/29/2013  3:19 AM  Secured By [employer] 06/29/2013  8:27 AM  Tube Holder Repositioned Yes 06/29/2013  3:19 AM  Cuff Pressure (cm H2O) 28 cm H2O 06/28/2013 11:11 PM  Site Condition Dry 06/29/2013  3:19 AM    Evaluation  O2 sats: stable throughout Complications: No apparent complications Patient did tolerate procedure well. Bilateral Breath Sounds: Coarse crackles;Diminished   Yes  [name] 06/29/2013, 10:31 AM

## 2013-06-29 NOTE — Progress Notes (Signed)
06/29/2013 1100  150 Ml Fentanyl wasted in sink.  [place], Linnell Fulling

## 2013-06-29 NOTE — Progress Notes (Signed)
Pre Extubation PT was positive with air leak. Current Sp02 on 2 lpm [place] 95%, HR68, [id number]

## 2013-06-29 NOTE — Progress Notes (Signed)
INITIAL NUTRITION ASSESSMENT  DOCUMENTATION CODES Per approved criteria  -Not Applicable   INTERVENTION:  If EN started, recommend Glucerna 1.2 formula ---> initiate at 20 ml/hr and increase by 10 ml every 4 hours to goal rate of 50 ml/hr with Prostat liquid protein 30 ml 3 times daily via tube to provide 1740 total kcals, 117 gm protein, 966 ml of free water RD to follow for nutrition care plan  NUTRITION DIAGNOSIS: Inadequate oral intake related to inability to eat as evidenced by NPO status  Goal: Initiate EN support within next 24-48 hours if prolonged intubation expected  Monitor:  EN initiation, respiratory status, weight, labs, I/O's  Reason for Assessment: VDRF  79 y.o. male  Admitting Dx: Acute encephalopathy  ASSESSMENT: Patient with PMH of CAD, HTN, DM, CHF and CKD admitted after being found down, hypoglycemic (glucose 20 on scene) with facial swelling; intubated in ED for airway protection.  Patient is currently intubated on ventilator support ---> OGT in place MV: 8.0 Temp: 36.7  If patient to remain intubated for > 24 hours, CCM plans to start enteral nutrition; RD recommendations above.  Height: Ht Readings from Last 1 Encounters:  06/28/13 6\' 1"  (1.854 m)    Weight: Wt Readings from Last 1 Encounters:  06/29/13 196 lb 3.4 oz (89 kg)    Ideal Body Weight: 184 lb  % Ideal Body Weight: 106%  Wt Readings from Last 10 Encounters:  06/29/13 196 lb 3.4 oz (89 kg)  06/19/13 203 lb (92.08 kg)  01/04/13 203 lb 6.4 oz (92.262 kg)  09/29/12 194 lb (87.998 kg)  08/29/12 190 lb 1.9 oz (86.238 kg)  08/20/12 194 lb (87.998 kg)  08/20/12 194 lb (87.998 kg)  08/20/12 194 lb (87.998 kg)  08/20/12 194 lb (87.998 kg)  08/20/12 194 lb (87.998 kg)    Usual Body Weight: 203 lb  % Usual Body Weight: 96%  BMI:  Body mass index is 25.89 kg/(m^2).  Estimated Nutritional Needs: Kcal: 1750-1900 Protein: 115-125 gm Fluid: 1.7-1.9 L  Skin: eyelid abrasion    Diet Order: NPO  EDUCATION NEEDS: -No education needs identified at this time   Intake/Output Summary (Last 24 hours) at 06/29/13 0907 Last data filed at 06/29/13 0800  Gross per 24 hour  Intake 848.66 ml  Output   1450 ml  Net -601.34 ml    Labs:   Recent Labs Lab 06/28/13 2104  NA 143  K 4.3  CL 106  CO2 23  BUN 44*  CREATININE 2.40*  CALCIUM 9.3  GLUCOSE 124*    CBG (last 3)   Recent Labs  06/29/13 0020 06/29/13 0400 06/29/13 0736  GLUCAP 126* 126* 180*    Scheduled Meds: . antiseptic oral rinse  15 mL Mouth Rinse QID  . chlorhexidine  15 mL Mouth Rinse BID  . pantoprazole (PROTONIX) IV  40 mg Intravenous Q24H  . piperacillin-tazobactam (ZOSYN)  IV  3.375 g Intravenous Q8H  . vancomycin  1,000 mg Intravenous Q24H    Continuous Infusions: . dextrose 50 mL/hr at 06/28/13 2347  . fentaNYL infusion INTRAVENOUS Stopped (06/29/13 0800)    Past Medical History  Diagnosis Date  . CAD (coronary artery disease)     s/p 3v CABG 2006, myoview 04/2010 EF 49%, prior inferior/apical infarct, no ischemia, LHC 11/2011 stable anatomy (occluded LAD filled from vein graft, distal LCx occluded prior to OM2, OM2 filled from vein graft, RCA prox 25%, mid 40%, distal 25% lesions, SVG to diagonal occluded) Med Rx  .  Chronic diastolic heart failure     hx of cardiorenal syndrome;  Echo 06/2012 EF 50-55%, mod diastolic dysfunction, mild MR, mod TR, mild R/LAE, mild RV dilatation, PASP .  . Carotid stenosis     a. Carotid dopplers R 40-59%, Left 60-79%;   b. carotids 12/13:  0-39% RICA, 60-79% LICA (rpt in 6 mos)  . HLD (hyperlipidemia)   . Mediastinal adenopathy     per CT chest in 2006 with PET scan showing very limited metabolic activity; not felt to have a significant neoplastic potential  . Iron deficiency anemia   . Hypertension   . Diabetes mellitus     insulin dependent   . GERD (gastroesophageal reflux disease)   . AV block, Mobitz 1     Noted 11/2011 in  hospital, BB stopped  . Colon polyps   . Diverticulosis   . Pulmonary arterial hypertension     a.  RHC 11/1 PASP 60 mmHg (mean 38); b. RHC 11/15 PA pressure 66/24, PCWP 26 and CO 6.2 => Sildenafil started;   c. Echo bubble study 11/13:  no obvious shunt;   d. VQ scan neg for pulmonary embolism  . CKD (chronic kidney disease) stage 5, GFR less than 15 ml/min     hx of cardiorenal syndrome;  Dr Allena Katz;  AVF recommended and scheduled (patient hesitant to proceed);   Renal US 11/13: diff echogenic kidneys c/w medical renal disease; no hydronephrosis or renal mass  . Hyperparathyroidism, secondary renal   . MGUS (monoclonal gammopathy of unknown significance)     a. Kappa/Lambda free light chain ratio 11/13:  23.99;  b. Met. Bone survey 11/13:  no osteolytic lesions to suggest MMyeloma  . Atrial flutter     diagnosed 05/2013; Coumadin initiated    Past Surgical History  Procedure Laterality Date  . Appendectomy    . Coronary artery bypass graft  2006    SVG to OM2, SVG to LAD, SVG to DX; (the LIMA did not have good flow and therefore was not used)  . Cardiac catheterization  12/21/2011    Maureen Chatters, RD, LDN Pager #: [phone] After-Hours Pager #: [phone]

## 2013-06-29 NOTE — Progress Notes (Signed)
ANTICOAGULATION CONSULT NOTE - Initial Consult  Pharmacy Consult for Coumadin Indication: Aflutter  No Known Allergies  Patient Measurements: Height: 6\' 1"  (185.4 cm) Weight: 196 lb 3.4 oz (89 kg) IBW/kg (Calculated) : 79.9  Vital Signs: Temp: 98.8 F (37.1 C) (10/03 1300) Temp src: Core (Comment) (10/03 1300) BP: 143/51 mmHg (10/03 1300) Pulse Rate: 64 (10/03 1300)  Labs:  Recent Labs  06/28/13 2104 06/28/13 2105 06/28/13 2106 06/29/13 1200  HGB 10.7*  --   --   --   HCT 32.0*  --   --   --   PLT 159  --   --   --   LABPROT  --   --  19.6* 22.0*  INR  --   --  1.71* 1.99*  CREATININE 2.40*  --   --   --   CKTOTAL  --  291*  --   --     Estimated Creatinine Clearance: 27.7 ml/min (by C-G formula based on Cr of 2.4).   Medical History: Past Medical History  Diagnosis Date  . CAD (coronary artery disease)     s/p 3v CABG 2006, myoview 04/2010 EF 49%, prior inferior/apical infarct, no ischemia, LHC 11/2011 stable anatomy (occluded LAD filled from vein graft, distal LCx occluded prior to OM2, OM2 filled from vein graft, RCA prox 25%, mid 40%, distal 25% lesions, SVG to diagonal occluded) Med Rx  . Chronic diastolic heart failure     hx of cardiorenal syndrome;  Echo 06/2012 EF 50-55%, mod diastolic dysfunction, mild MR, mod TR, mild R/LAE, mild RV dilatation, PASP .  . Carotid stenosis     a. Carotid dopplers R 40-59%, Left 60-79%;   b. carotids 12/13:  0-39% RICA, 60-79% LICA (rpt in 6 mos)  . HLD (hyperlipidemia)   . Mediastinal adenopathy     per CT chest in 2006 with PET scan showing very limited metabolic activity; not felt to have a significant neoplastic potential  . Iron deficiency anemia   . Hypertension   . Diabetes mellitus     insulin dependent   . GERD (gastroesophageal reflux disease)   . AV block, Mobitz 1     Noted 11/2011 in hospital, BB stopped  . Colon polyps   . Diverticulosis   . Pulmonary arterial hypertension     a.  RHC 11/1 PASP 60  mmHg (mean 38); b. RHC 11/15 PA pressure 66/24, PCWP 26 and CO 6.2 => Sildenafil started;   c. Echo bubble study 11/13:  no obvious shunt;   d. VQ scan neg for pulmonary embolism  . CKD (chronic kidney disease) stage 5, GFR less than 15 ml/min     hx of cardiorenal syndrome;  Dr Allena Katz;  AVF recommended and scheduled (patient hesitant to proceed);   Renal US 11/13: diff echogenic kidneys c/w medical renal disease; no hydronephrosis or renal mass  . Hyperparathyroidism, secondary renal   . MGUS (monoclonal gammopathy of unknown significance)     a. Kappa/Lambda free light chain ratio 11/13:  23.99;  b. Met. Bone survey 11/13:  no osteolytic lesions to suggest MMyeloma  . Atrial flutter     diagnosed 05/2013; Coumadin initiated   Assessment: 82yom recently diagnosed with new aflutter and started on coumadin 06/19/13. PTA regimen appears to be 5mg  daily except 2.5mg  on Sat/Sun, however, due to patient's mental status, unable to confirm this and don't know when his last dose was. INR on admission was below goal at 1.71 and has continued to rise  to 1.99 today despite no coumadin given 10/2.  Goal of Therapy:  INR 2-3 Monitor platelets by anticoagulation protocol: Yes   Plan:  1) Coumadin 2.5mg  x 1 tonight 2) Daily INR 3) Confirm home dose when able  Fredrik Rigger 06/29/2013,1:09 PM

## 2013-06-29 NOTE — Progress Notes (Signed)
eLink Physician-Brief Progress Note Patient Name: [name] DOB: 12-17-1930 MRN: [id number]  Date of Service  06/29/2013   HPI/Events of Note   hypertensive  eICU Interventions  Hydralazine ordered prn    Intervention Category Major Interventions: Hypertension - evaluation and management  Shan Levans 06/29/2013, 1:03 AM

## 2013-06-30 ENCOUNTER — Inpatient Hospital Stay (HOSPITAL_COMMUNITY): Payer: Medicare Other

## 2013-06-30 DIAGNOSIS — N289 Disorder of kidney and ureter, unspecified: Secondary | ICD-10-CM

## 2013-06-30 DIAGNOSIS — N189 Chronic kidney disease, unspecified: Secondary | ICD-10-CM

## 2013-06-30 LAB — URINE CULTURE
Colony Count: NO GROWTH
Culture: NO GROWTH

## 2013-06-30 LAB — BASIC METABOLIC PANEL
BUN: 55 mg/dL — ABNORMAL HIGH (ref 6–23)
Calcium: 9.1 mg/dL (ref 8.4–10.5)
GFR calc Af Amer: 19 mL/min — ABNORMAL LOW (ref >90)
GFR calc non Af Amer: 16 mL/min — ABNORMAL LOW (ref >90)
Glucose, Bld: 203 mg/dL — ABNORMAL HIGH (ref 70–99)
Potassium: 4.5 mEq/L (ref 3.5–5.1)
Sodium: 133 mEq/L — ABNORMAL LOW (ref 135–145)

## 2013-06-30 LAB — PROTIME-INR
INR: 3.15 — ABNORMAL HIGH (ref 0.00–1.49)
Prothrombin Time: 31.2 seconds — ABNORMAL HIGH (ref 11.6–15.2)

## 2013-06-30 LAB — CBC
Hemoglobin: 9.1 g/dL — ABNORMAL LOW (ref 13.0–17.0)
MCH: 29.4 pg (ref 26.0–34.0)
MCHC: 33.8 g/dL (ref 30.0–36.0)
Platelets: 129 10*3/uL — ABNORMAL LOW (ref 150–400)
RBC: 3.1 MIL/uL — ABNORMAL LOW (ref 4.22–5.81)
RDW: 15.7 % — ABNORMAL HIGH (ref 11.5–15.5)

## 2013-06-30 LAB — PROCALCITONIN: Procalcitonin: 9.03 ng/mL

## 2013-06-30 LAB — GLUCOSE, CAPILLARY: Glucose-Capillary: 179 mg/dL — ABNORMAL HIGH (ref 70–99)

## 2013-06-30 MED ORDER — FUROSEMIDE 10 MG/ML IJ SOLN
40.0000 mg | Freq: Once | INTRAMUSCULAR | Status: AC
  Administered 2013-06-30: 40 mg via INTRAVENOUS

## 2013-06-30 MED ORDER — SODIUM CHLORIDE 0.9 % IV BOLUS (SEPSIS)
500.0000 mL | Freq: Once | INTRAVENOUS | Status: AC
  Administered 2013-06-30: 500 mL via INTRAVENOUS

## 2013-06-30 MED ORDER — FUROSEMIDE 10 MG/ML IJ SOLN: 40.0000 mg | Freq: Every day | INTRAMUSCULAR | Status: DC

## 2013-06-30 MED ORDER — SODIUM CHLORIDE 0.9 % IV SOLN
INTRAVENOUS | Status: DC
  Administered 2013-06-30 – 2013-07-03 (×5): via INTRAVENOUS

## 2013-06-30 MED ORDER — VANCOMYCIN HCL IN DEXTROSE 1-5 GM/200ML-% IV SOLN
1000.0000 mg | INTRAVENOUS | Status: DC
  Administered 2013-06-30 – 2013-07-02 (×2): 1000 mg via INTRAVENOUS
  Filled 2013-06-30 (×2): qty 200

## 2013-06-30 NOTE — Progress Notes (Signed)
eLink Physician-Brief Progress Note Patient Name: [name] DOB: 1931-05-04 MRN: [id number]  Date of Service  06/30/2013   HPI/Events of Note  1/2 cultures pos for GPC   eICU Interventions  Start Vancomycin; repeat BC; no central lines;  Repeat BC now and monitor for final speciation of the pos cultures Consider ID consult in am        [facility] 06/30/2013, 5:04 AM

## 2013-06-30 NOTE — Progress Notes (Signed)
eLink Physician-Brief Progress Note Patient Name: [name] DOB: 1931-07-30 MRN: [id number]  Date of Service  06/30/2013   HPI/Events of Note  I was called by the nurse, the patient with decreased urine output; he is at home on Lasix    eICU Interventions  Restart Lasix at 40 mg daily       Britnay Magnussen 06/30/2013, 3:02 AM

## 2013-06-30 NOTE — Progress Notes (Signed)
MD notified that pt has not voided at this time. Bladder scan shows at best. Will continue to monitor.  Cooke, Parker Elizabeth

## 2013-06-30 NOTE — Progress Notes (Addendum)
Chart reviewed. Last OV June '13. He has been followed by both GI and cardiology in the interval. Remain PCP - will request transfer to [facility]5 instead of [facility].  Thanks  Addendum- patient with changed speech pattern, change is grip strength from his baseline. Poor recall of events. Question CVA - CT brain without acute change. Reviewed prior Carotid doppler - had 60-79% obstructive disease.   Plan MRI brain w/o contrast

## 2013-06-30 NOTE — Progress Notes (Signed)
PULMONARY  / CRITICAL CARE MEDICINE  Name: [name] MRN: [id number] DOB: [birth year]    ADMISSION DATE:  06/28/2013 CONSULTATION DATE:  06/28/2013  REFERRING MD :  EDP PRIMARY SERVICE:  PCCM  CHIEF COMPLAINT:  Acute encephalopathy  BRIEF PATIENT DESCRIPTION: 79 yo with multiple comorbidities brought to [facility] ED after being found down, hypoglycemic, with facial swelling.  Intubated for airway protection.  SIGNIFICANT EVENTS: 10/02 Admit 10/03 Extubated 10/04 Decreased urine outpt, GPC in one blood cx, transfer to telemetry  STUDIES:  10/2  Head CT >>> NAICP  LINES / TUBES: OETT 10/2 >>>10/3  CULTURES: 10/2 Blood >>> GPC in one of two >>> 10/2 Urine >>> negative  ANTIBIOTICS: Zosyn 10/2 >>> 10/03 Vancomycin 10/2 >>>  INTERVAL HISTORY: Noted to have low urine outpt >> bladder scan w/o residual urine, and minimal response to lasix.  Denies chest pain, dyspnea, abdominal pain.  Tolerating diet.  VITAL SIGNS: Temp:  [98.1 F (36.7 C)-99.4 F (37.4 C)] 99 F (37.2 C) (10/04 0700) Pulse Rate:  [53-79] 63 (10/04 0600) Resp:  [11-25] 25 (10/04 0600) BP: (122-191)/(39-101) 148/61 mmHg (10/04 0600) SpO2:  [90 %-99 %] 92 % (10/04 0600) FiO2 (%):  [40 %] 40 % (10/03 0900) Weight:  [199 lb 8.3 oz (90.5 kg)] 199 lb 8.3 oz (90.5 kg) (10/04 0500) 3 liters [place]  INTAKE / OUTPUT: Intake/Output     10/03 0701 - 10/04 0700 10/04 0701 - 10/05 0700   P.O. 1440    I.V. (mL/kg) 795.8 (8.8)    IV Piggyback 200    Total Intake(mL/kg) 2435.8 (26.9)    Urine (mL/kg/hr) 370 (0.2)    Total Output 370     Net +2065.8            PHYSICAL EXAMINATION:  Gen: no distress HEENT: no sinus tenderness PULM: no wheeze CV: irregular, no murmur AB: BS+, soft, nontender Ext: minimal ankle edema Derm: pressure ulcers R shoulder, bilateral knees all anterior aspect Neuro: alert, follows commands, moves all extremities   LABS: CBC Recent Labs     06/28/13  2104  06/30/13  0500  WBC   7.9  7.1  HGB  10.7*  9.1*  HCT  32.0*  26.9*  PLT  159  129*    Coag's Recent Labs     06/28/13  2106  06/29/13  1200  06/30/13  0500  INR  1.71*  1.99*  3.15*    BMET Recent Labs     06/28/13  2104  06/30/13  0500  NA  143  133*  K  4.3  4.5  CL  106  98  CO2  23  20  BUN  44*  55*  CREATININE  2.40*  3.30*  GLUCOSE  124*  203*    Electrolytes Recent Labs     06/28/13  2104  06/30/13  0500  CALCIUM  9.3  9.1    Sepsis Markers Recent Labs     06/28/13  2245  06/29/13  0425  06/30/13  0500  PROCALCITON  0.47  4.06  9.03    ABG Recent Labs     06/28/13  2208  PHART  7.415  PCO2ART  33.6*  PO2ART  62.0*    Liver Enzymes Recent Labs     06/28/13  2104  AST  54*  ALT  60*  ALKPHOS  101  BILITOT  0.6  ALBUMIN  4.0    Cardiac Enzymes Recent Labs     06/28/13  2105  PROBNP  4540.0*    Glucose Recent Labs     06/29/13  0020  06/29/13  0400  06/29/13  0736  06/29/13  1150  06/29/13  1649  06/29/13  2212  GLUCAP  126*  126*  180*  218*  220*  213*    Imaging Ct Head Wo Contrast  06/28/2013   *RADIOLOGY REPORT*  Clinical Data: Unresponsive  CT HEAD WITHOUT CONTRAST  Technique:  Contiguous axial images were obtained from the base of the skull through the vertex without contrast.  Comparison: 07/24/2012  Findings: There is diffuse atrophy and there is moderate low attenuation in the deep white matter, similar to prior study. There is a tiny chronic appearing lacunar infarct in the left thalamus, stable.  There is no acute vascular territory infarct. There is no hemorrhage or extra-axial fluid.  There is mild to moderate chronic inflammatory change in the sphenoid, maxillary, frontal, and ethmoid air cells.  The calvarium is intact.  IMPRESSION: No acute findings   Original Report Authenticated By: Esperanza Heir, M.D.   Portable Chest Xray  06/28/2013   CLINICAL DATA:  Endotracheal tube placement.  EXAM: PORTABLE CHEST - 1 VIEW  COMPARISON:   06/28/2013  FINDINGS: The endotracheal tube is approximately 7 cm above the carina. Slightly improved aeration status post intubation. Persistent edema and atelectasis.  IMPRESSION: Endotracheal tube is 7 cm above the carina.  Slight improved aeration post intubation.   Electronically Signed   By: Loralie Champagne M.D.   On: 06/28/2013 21:20   Dg Chest Portable 1 View  06/28/2013   CLINICAL DATA:  Altered mental status.  EXAM: PORTABLE CHEST - 1 VIEW  COMPARISON:  08/07/2012.  FINDINGS: The heart is mildly enlarged but stable. Stable surgical changes from triple bypass surgery. There is vascular congestion and mild interstitial it suggesting CHF. No pleural effusion.  IMPRESSION: Mild CHF.   Electronically Signed   By: Loralie Champagne M.D.   On: 06/28/2013 20:56       ASSESSMENT / PLAN:  PULMONARY A:   Acute respiratory failure 2nd to acute encephalopathy. Hx of Pulmonary HTN in setting of diastolic CHF. Facial edema noted on admission >> not evident 10/3. P:   -bronchial hygiene -oxygen to keep SpO2 > 92%  CARDIOVASCULAR A:  Hypotension on admission >> resolved. Hx of CAD, hyperlipidemia, HTN, chronic diastolic CHF, a flutter. P:  -continue norvasc, asa, lipitor, coreg -pharmacy to adjust coumadin  RENAL A:  Stage V CKD. P:   -even fluid balance for now >> hold outpt lasix for now -give fluid bolus x one 10/04 -monitor renal fx, urine outpt, electrolytes  GASTROINTESTINAL A:   Nutrition. P:   -carb modified diet -d/c protonix >> SUP no longer indicated   HEMATOLOGIC A:   Anemia of chronic disease. P:  -f/u CBC  INFECTIOUS A:   GPC in one of two blood cx, and rising procalcitonin. P:   -continue vancomycin per pharmacy pending cx results  ENDOCRINE  A:   Hypoglycemia on admission >> improved. Hx of DM type II. P:   -d/c dextrose in IV fluids now that he is tolerating diet -SSI  NEUROLOGIC A:   Acute encephalopathy 2nd to hypoglycemia, hypotension,  hypoxia >> resolved 10/3. P:   -monitor mental status  Summary: OOB to chair, PT assessment.  Transfer to telemetry.  Will transfer to [facility] for 10/05 and PCCM sign off.  Coralyn Helling, MD [facility] Pulmonary/Critical Care 06/30/2013, 8:22 AM Pager:  [phone]  After 3pm call: [phone]

## 2013-06-30 NOTE — Progress Notes (Signed)
ANTICOAGULATION CONSULT NOTE - Follow Up Consult  Pharmacy Consult for Coumadin Indication: Aflutter  No Known Allergies  Patient Measurements: Height: 6\' 1"  (185.4 cm) Weight: 199 lb 8.3 oz (90.5 kg) IBW/kg (Calculated) : 79.9  Vital Signs: Temp: 98.9 F (37.2 C) (10/04 0400) Temp src: Oral (10/04 0400) BP: 148/61 mmHg (10/04 0600) Pulse Rate: 63 (10/04 0600)  Labs:  Recent Labs  06/28/13 2104 06/28/13 2105 06/28/13 2106 06/29/13 1200 06/30/13 0500  HGB 10.7*  --   --   --  9.1*  HCT 32.0*  --   --   --  26.9*  PLT 159  --   --   --  129*  LABPROT  --   --  19.6* 22.0* 31.2*  INR  --   --  1.71* 1.99* 3.15*  CREATININE 2.40*  --   --   --  3.30*  CKTOTAL  --  291*  --   --   --     Estimated Creatinine Clearance: 20.2 ml/min (by C-G formula based on Cr of 3.3).  Assessment: 82yom recently diagnosed with new aflutter and started on coumadin 06/19/13.  INR on admission was below goal at 1.71. PTA dose confirmed 5mg  daily except 2.5mg  on Sat/Sun. Smaller 2.5mg  dose was given yesterday and now INR has jumped significantly to 3.15. Hgb/Hct/Plts have dropped some, no bleeding reported.  Goal of Therapy:  INR 2-3 Monitor platelets by anticoagulation protocol: Yes   Plan:  1) No coumadin tonight 2) INR in AM  Fredrik Rigger 06/30/2013,7:28 AM

## 2013-06-30 NOTE — Progress Notes (Signed)
CRITICAL VALUE ALERT  Critical value received:  +Blood Culture - Aerobic Bottle - Gram + Cocci and clusters  Date of notification:  06/30/2013  Time of notification:  0435  Critical value read back:yes  Nurse who received alert:  [place] Perkins  MD notified (1st page):  Dr. Frederico Hamman  Time of first page:  0500  MD notified (2nd page):  Time of second page:  Responding MD:  Dr. Frederico Hamman  Time MD responded:  0500  Perkins, [place] Elizabeth

## 2013-06-30 NOTE — Progress Notes (Signed)
ANTIBIOTIC CONSULT NOTE - INITIAL  Pharmacy Consult for Vancomcycin Indication: positive blood cultures  No Known Allergies  Patient Measurements: Height: 6\' 1"  (185.4 cm) Weight: 196 lb 3.4 oz (89 kg) IBW/kg (Calculated) : 79.9  Vital Signs: Temp: 99.4 F (37.4 C) (10/04 0000) Temp src: Oral (10/04 0000) BP: 147/60 mmHg (10/04 0400) Pulse Rate: 57 (10/04 0400) Intake/Output from previous day: 10/03 0701 - 10/04 0700 In: 2175.8 [P.O.:1440; I.V.:735.8] Out: 370 [Urine:370] Intake/Output from this shift: Total I/O In: 270 [I.V.:270] Out: -   Labs:  Recent Labs  06/28/13 2104  WBC 7.9  HGB 10.7*  PLT 159  CREATININE 2.40*   Estimated Creatinine Clearance: 27.7 ml/min (by C-G formula based on Cr of 2.4). No results found for this basename: VANCOTROUGH, Leodis Binet, VANCORANDOM, GENTTROUGH, GENTPEAK, GENTRANDOM, TOBRATROUGH, TOBRAPEAK, TOBRARND, AMIKACINPEAK, AMIKACINTROU, AMIKACIN,  in the last 72 hours   Microbiology: Recent Results (from the past 720 hour(s))  CULTURE, BLOOD (ROUTINE X 2)     Status: None   Collection Time    06/28/13  8:55 PM      Result Value Range Status   Specimen Description BLOOD WRIST RIGHT   Final   Special Requests BOTTLES DRAWN AEROBIC ONLY 2.5CC   Final   Culture  Setup Time     Final   Value: 06/29/2013 05:49     Performed at [facility]   Culture     Final   Value: GRAM POSITIVE COCCI IN CLUSTERS     0430 Note: Gram Stain Report Called to,Read Back By and Verified With: [place] PERKINS 06/30/2013 [facility]     Performed at [facility]   Report Status PENDING   Incomplete  URINE CULTURE     Status: None   Collection Time    06/28/13  9:20 PM      Result Value Range Status   Specimen Description URINE, CLEAN CATCH   Final   Special Requests NONE   Final   Culture  Setup Time     Final   Value: 06/29/2013 00:43     Performed at [facility] Count     Final   Value: NO GROWTH     Performed at  [facility]   Culture     Final   Value: NO GROWTH     Performed at [facility]   Report Status 06/30/2013 FINAL   Final  MRSA PCR SCREENING     Status: None   Collection Time    06/28/13 11:32 PM      Result Value Range Status   MRSA by PCR NEGATIVE  NEGATIVE Final   Comment:            The GeneXpert MRSA Assay (FDA     approved for NASAL specimens     only), is one component of a     comprehensive MRSA colonization     surveillance program. It is not     intended to diagnose MRSA     infection nor to guide or     monitor treatment for     MRSA infections.   Assessment: 79 yo male with 1/2 blood cultures with GPC for empiric vancomycin.  Vancomycin 1 g IV given in ED at 2200 10/2  Goal of Therapy:  Vancomycin trough level 15-20 mcg/ml  Plan:  Vancomycin 1 g IV q48h  Rowland Ericsson, Gary Fleet 06/30/2013,5:12 AM

## 2013-06-30 NOTE — Progress Notes (Signed)
MD made aware that pt still has not spontaneously voided at this time. Bladder scan shows at best. Will continue to monitor.  Perkins, [place] Elizabeth

## 2013-07-01 DIAGNOSIS — I129 Hypertensive chronic kidney disease with stage 1 through stage 4 chronic kidney disease, or unspecified chronic kidney disease: Secondary | ICD-10-CM

## 2013-07-01 DIAGNOSIS — N185 Chronic kidney disease, stage 5: Secondary | ICD-10-CM

## 2013-07-01 LAB — BASIC METABOLIC PANEL
BUN: 64 mg/dL — ABNORMAL HIGH (ref 6–23)
Calcium: 8.9 mg/dL (ref 8.4–10.5)
Chloride: 99 mEq/L (ref 96–112)
GFR calc Af Amer: 17 mL/min — ABNORMAL LOW (ref >90)
GFR calc non Af Amer: 15 mL/min — ABNORMAL LOW (ref >90)
Glucose, Bld: 185 mg/dL — ABNORMAL HIGH (ref 70–99)
Sodium: 133 mEq/L — ABNORMAL LOW (ref 135–145)

## 2013-07-01 LAB — CULTURE, BLOOD (ROUTINE X 2)

## 2013-07-01 LAB — CBC
HCT: 26.3 % — ABNORMAL LOW (ref 39.0–52.0)
Hemoglobin: 8.8 g/dL — ABNORMAL LOW (ref 13.0–17.0)
MCH: 29.1 pg (ref 26.0–34.0)
MCHC: 33.5 g/dL (ref 30.0–36.0)
RDW: 15.5 % (ref 11.5–15.5)
WBC: 5.8 10*3/uL (ref 4.0–10.5)

## 2013-07-01 LAB — GLUCOSE, CAPILLARY
Glucose-Capillary: 206 mg/dL — ABNORMAL HIGH (ref 70–99)
Glucose-Capillary: 237 mg/dL — ABNORMAL HIGH (ref 70–99)
Glucose-Capillary: 247 mg/dL — ABNORMAL HIGH (ref 70–99)

## 2013-07-01 LAB — PROTIME-INR
INR: 2.04 — ABNORMAL HIGH (ref 0.00–1.49)
Prothrombin Time: 22.4 seconds — ABNORMAL HIGH (ref 11.6–15.2)

## 2013-07-01 MED ORDER — INSULIN DETEMIR 100 UNIT/ML ~~LOC~~ SOLN
15.0000 [IU] | Freq: Every day | SUBCUTANEOUS | Status: DC
  Administered 2013-07-01 – 2013-07-05 (×5): 15 [IU] via SUBCUTANEOUS
  Filled 2013-07-01 (×6): qty 0.15

## 2013-07-01 MED ORDER — SODIUM CHLORIDE 0.9 % IJ SOLN
3.0000 mL | Freq: Two times a day (BID) | INTRAMUSCULAR | Status: DC
  Administered 2013-07-01 – 2013-07-03 (×4): 3 mL via INTRAVENOUS

## 2013-07-01 MED ORDER — WARFARIN SODIUM 4 MG PO TABS
4.0000 mg | ORAL_TABLET | Freq: Once | ORAL | Status: AC
  Administered 2013-07-01: 4 mg via ORAL
  Filled 2013-07-01: qty 1

## 2013-07-01 NOTE — Evaluation (Signed)
Physical Therapy Evaluation Patient Details Name: [name] MRN: [id number] DOB: 07/26/1931 Today's Date: 07/01/2013 Time: 6213-0865 PT Time Calculation (min): 23 min  PT Assessment / Plan / Recommendation History of Present Illness    79 yo with multiple comorbidities brought to [facility] ED after being found down, hypoglycemic, with facial swelling. Intubated for airway protection.   Clinical Impression  Pt unreliable historian, presents with dependencies in mobility that currently prevent return home alone due to safety and inability to care for self.  Recommend SNF for rehab post acute.  Will follow acutely to address deficits and assist with d/c as needed. Thanks.    PT Assessment  Patient needs continued PT services    Follow Up Recommendations  SNF    Does the patient have the potential to tolerate intense rehabilitation      Barriers to Discharge Decreased caregiver support      Equipment Recommendations  Rolling walker with 5" wheels    Recommendations for Other Services OT consult   Frequency Min 3X/week    Precautions / Restrictions Precautions Precautions: Fall Precaution Comments: safety awareness, judgement, weakness, new need for assistive device   Pertinent Vitals/Pain no apparent distress       Mobility  Bed Mobility Bed Mobility: Not assessed Transfers Transfers: Sit to Stand;Stand to Sit Sit to Stand: 3: Mod assist;With upper extremity assist;From chair/3-in-1 Stand to Sit: 3: Mod assist;With upper extremity assist;To chair/3-in-1 Details for Transfer Assistance: attempts to stand from back of chair, needs cues after several failed attempts to scoot out, successful with physcial assist and help to steady at RW initially; decends uncontrolled upon return Ambulation/Gait Ambulation/Gait Assistance: 4: Min assist Ambulation Distance (Feet): 50 Feet Assistive device: Rolling walker Ambulation/Gait Assistance Details: constant cues to assess how he  feels, cues to stand tall, stay close to walker and to decide to return to room.  Gait degrades over time with no buckling but obvious muscular fatigue Gait Pattern: Step-through pattern;Decreased stride length;Shuffle;Trunk flexed;Narrow base of support Gait velocity: decr, unmeasured Stairs: No Wheelchair Mobility Wheelchair Mobility: No    Exercises     PT Diagnosis: Difficulty walking  PT Problem List: Decreased skin integrity;Cardiopulmonary status limiting activity;Decreased knowledge of precautions;Decreased safety awareness;Decreased knowledge of use of DME;Decreased cognition;Decreased mobility;Decreased balance;Decreased strength;Decreased activity tolerance PT Treatment Interventions: Patient/family education;Cognitive remediation;Balance training;Therapeutic exercise;Therapeutic activities;Functional mobility training;Gait training;DME instruction     PT Goals(Current goals can be found in the care plan section) Acute Rehab PT Goals Patient Stated Goal: unable PT Goal Formulation: Patient unable to participate in goal setting Time For Goal Achievement: 07/15/13 Potential to Achieve Goals: Good  Visit Information  Last PT Received On: 07/01/13 Assistance Needed: +1       Prior Functioning  Home Living Family/patient expects to be discharged to:: Private residence Living Arrangements: Children Available Help at Discharge: Family;Available PRN/intermittently (son is 'in and out') Type of Home: House Home Access: Stairs to enter [employer] of Steps: 4 Entrance Stairs-Rails: Right Home Layout: Two level;Able to live on main level with bedroom/bathroom Home Equipment: None;Other (comment) (unreliable historian??) Prior Function Level of Independence: Independent;Independent with assistive device(s) (unreliable historian) Communication Communication: No difficulties    Cognition  Cognition Arousal/Alertness: Awake/alert Behavior During Therapy: WFL for  tasks assessed/performed Overall Cognitive Status: Impaired/Different from baseline Area of Impairment: Safety/judgement;Awareness Safety/Judgement: Decreased awareness of safety;Decreased awareness of deficits Awareness: Intellectual General Comments: states 'it was easy' to get bed to chair with nursing, but struggles to stand unassisted; states he does  not use device, but dependent on device for stabilty to walk; states everything feels fine and denies any deficits    Extremity/Trunk Assessment Upper Extremity Assessment Upper Extremity Assessment: Defer to OT evaluation Lower Extremity Assessment Lower Extremity Assessment: Generalized weakness   Balance High Level Balance High Level Balance Comments: in general pt dependent upon external support to maintain balance static and dynamic  End of Session PT - End of Session Equipment Utilized During Treatment: Gait belt Activity Tolerance: Patient limited by fatigue;Patient tolerated treatment well Patient left: in chair;with call bell/phone within reach;with nursing/sitter in room Nurse Communication: Mobility status  GP     Dennis Bast 07/01/2013, 3:10 PM

## 2013-07-01 NOTE — Progress Notes (Addendum)
I&0 inaccurate, unable to keep condom cath on Pt. Pt has small trace amount of blood in sputum.No documented BM since admitt no complaints of constipation at this time. will continue to monitor

## 2013-07-01 NOTE — Progress Notes (Signed)
ANTICOAGULATION CONSULT NOTE - Follow Up Consult  Pharmacy Consult for Coumadin Indication: Aflutter  No Known Allergies  Patient Measurements: Height: 5\' 11"  (180.3 cm) Weight: 201 lb 14.4 oz (91.581 kg) (scale C) IBW/kg (Calculated) : 75.3  Vital Signs: Temp: 98.2 F (36.8 C) (10/05 0500) Temp src: Oral (10/05 0500) BP: 175/72 mmHg (10/05 0952) Pulse Rate: 74 (10/05 0952)  Labs:  Recent Labs  06/28/13 2104 06/28/13 2105  06/29/13 1200 06/30/13 0500 07/01/13 0422  HGB 10.7*  --   --   --  9.1* 8.8*  HCT 32.0*  --   --   --  26.9* 26.3*  PLT 159  --   --   --  129* 130*  LABPROT  --   --   < > 22.0* 31.2* 22.4*  INR  --   --   < > 1.99* 3.15* 2.04*  CREATININE 2.40*  --   --   --  3.30* 3.52*  CKTOTAL  --  291*  --   --   --   --   < > = values in this interval not displayed.  Estimated Creatinine Clearance: 19.4 ml/min (by C-G formula based on Cr of 3.52).  Assessment: 82yom recently diagnosed with new aflutter and started on coumadin 06/19/13.  INR on admission was below goal at 1.71. PTA dose confirmed 5mg  daily except 2.5mg  on Sat/Sun. Coumadin was on hold yesterday due to big INR increase, INR 2.04 < 3.25 this morning. Hgb/Hct/Plts are stable from yesterday, no bleeding reported.  Also started vancomycin yesterday for 1/2 blood cultures is positive for GPC, culture results shows coagulase negative staph., likely contamination. Recheck blood culture are ngtd.    Goal of Therapy:  INR 2-3 Monitor platelets by anticoagulation protocol: Yes   Plan:  1) Coumadin 4 mg po x 1 2) INR in AM 3) please consider d/c vancomycin since his scr is trending up  Bayard Hugger, PharmD, BCPS  Clinical Pharmacist  Pager: [phone]   07/01/2013,10:19 AM

## 2013-07-01 NOTE — Progress Notes (Signed)
Subjective: Sitting up in chair. Easily awakened. Noted to have pooled sedretions vs applesauce in his mouth while asleep. No choking when he is awakened. He states he lives alone managing all his ADLs and medications.   Objective: Lab:  Recent Labs  06/28/13 2104 06/30/13 0500 07/01/13 0422  WBC 7.9 7.1 5.8  NEUTROABS 6.2  --   --   HGB 10.7* 9.1* 8.8*  HCT 32.0* 26.9* 26.3*  MCV 87.7 86.8 87.1  PLT 159 129* 130*    Recent Labs  06/28/13 2104 06/30/13 0500 07/01/13 0422  NA 143 133* 133*  K 4.3 4.5 4.2  CL 106 98 99  GLUCOSE 124* 203* 185*  BUN 44* 55* 64*  CREATININE 2.40* 3.30* 3.52*  CALCIUM 9.3 9.1 8.9    Imaging: 06/30/13 MRI brain: COMPARISON: CT head 06/28/2013.  FINDINGS:  No evidence for acute infarction, hemorrhage, mass lesion,  hydrocephalus, or extra-axial fluid. Advanced atrophy. Hydrocephalus  ex vacuo. Chronic microvascular ischemic change.  Tiny foci of chronic hemorrhage in the supratentorial cortex and  periventricular white matter likely sequelae of hypertensive  cerebral vascular disease with remote blood breakdown products.  Remote lacunar infarcts affect the deep nuclei and white matter.  Remote chronic infarction right parietal cortex.  Flow voids are maintained in the carotid, basilar, and left  vertebral arteries; right vertebral is hypoplastic.  Negative osseous structures. Unremarkable pituitary and cerebellar  tonsils. Mild fluid in the predental C1-C2 compartment without  pannus. No cervicomedullary compression. Scalp, orbits, and mastoids  unremarkable. Mild chronic sinus disease.  IMPRESSION:  Advanced atrophy, chronic microvascular ischemic change, and  sequelae of hypertensive cerebrovascular disease. No acute  intracranial findings are evident.  Scheduled Meds: . amLODipine  10 mg Oral Daily  . antiseptic oral rinse  15 mL Mouth Rinse BID  . aspirin EC  81 mg Oral Daily  . atorvastatin  20 mg Oral QHS  . carvedilol  6.25  mg Oral BID  . insulin aspart  0-9 Units Subcutaneous TID WC  . sodium chloride  3 mL Intravenous Q12H  . vancomycin  1,000 mg Intravenous Q48H  . Warfarin - Pharmacist Dosing Inpatient   Does not apply q1800   Continuous Infusions: . sodium chloride 50 mL/hr at 06/30/13 2147   PRN Meds:.hydrALAZINE   Physical Exam: Filed Vitals:   07/01/13 0952  BP: 175/72  Pulse: 74  Temp:   Resp: 20    Intake/Output Summary (Last 24 hours) at 07/01/13 1035 Last data filed at 07/01/13 1610  Gross per 24 hour  Intake 1422.5 ml  Output     75 ml  Net 1347.5 ml   Gen'l - heavy set elderly man who compared to last visit in June '13 has lost weight/ HEENT- C&S clear, pooled food in mouth Cor - 2+ radial pulse, RRR PUlm - normal respirations, Lungs CTAP Neuor - awakens easily, follows commands, speech a little slurred but intelligible.     Assessment/Plan: 1. Endo - h/o insulin dependent DM.  Lab Results  Component Value Date   HGBA1C 6.6* 07/24/2012   Prehospitalization he was on Levemir 30 units.  Plan Recheck A1C  Resume Levelmir at 15 units qHS  2. Pulm - no increased WOB, no wheezes or rales. Seems to be fine.  3. Neuro- based on last exam in June he has a change in speech. Question of old CVA in the last year. MRI w./o acute change.  Plan SLP eval re: swallow and speech  PT/OT evaluatin  4.  CKD- rise in creatinine. He is follow by nephrology for CKD 4.  Plan Monitor renal function  Insure follow up with nephrology  5. Cardiac - appears stable. No events on telemetry. Cardiac Panel (last 3 results)  Recent Labs  06/28/13 2105  CKTOTAL 291*   BNP - chronic elevation. No evidence of any acute cardiac event  6. HTN - stable  Dispo - home vs SNF based on requested evaluations    Doroteo Glassman IM (o) [phone]; (c) [phone] Call-grp - [name] IM  Tele: [phone]  07/01/2013, 10:29 AM

## 2013-07-01 NOTE — Progress Notes (Signed)
Pt O2x self place. No compliants of pain or sob. SBP 175. Will give Coreg and recheck. Will continue to monitor PT

## 2013-07-02 ENCOUNTER — Inpatient Hospital Stay (HOSPITAL_COMMUNITY): Payer: Medicare Other

## 2013-07-02 LAB — BASIC METABOLIC PANEL
CO2: 23 mEq/L (ref 19–32)
Chloride: 104 mEq/L (ref 96–112)
GFR calc non Af Amer: 20 mL/min — ABNORMAL LOW (ref >90)
Glucose, Bld: 119 mg/dL — ABNORMAL HIGH (ref 70–99)
Potassium: 4 mEq/L (ref 3.5–5.1)
Sodium: 138 mEq/L (ref 135–145)

## 2013-07-02 LAB — GLUCOSE, CAPILLARY
Glucose-Capillary: 98 mg/dL (ref 70–99)
Glucose-Capillary: 166 mg/dL — ABNORMAL HIGH (ref 70–99)
Glucose-Capillary: 146 mg/dL — ABNORMAL HIGH (ref 70–99)

## 2013-07-02 LAB — HEMOGLOBIN A1C: Mean Plasma Glucose: 171 mg/dL — ABNORMAL HIGH (ref <117)

## 2013-07-02 LAB — PROTIME-INR: INR: 1.66 — ABNORMAL HIGH (ref 0.00–1.49)

## 2013-07-02 MED ORDER — WARFARIN SODIUM 5 MG PO TABS
5.0000 mg | ORAL_TABLET | Freq: Once | ORAL | Status: AC
  Administered 2013-07-02: 5 mg via ORAL
  Filled 2013-07-02: qty 1

## 2013-07-02 NOTE — Progress Notes (Signed)
Administered Wafarin crush in apple sauce. Pt swallowed, Rn check  Mouth.

## 2013-07-02 NOTE — Evaluation (Signed)
Clinical/Bedside Swallow Evaluation Patient Details  Name: [name] MRN: [id number] Date of Birth: 11/12/1930  Today's Date: 07/02/2013 Time: 8657-8469 SLP Time Calculation (min): 11 min  Past Medical History:  Past Medical History  Diagnosis Date  . CAD (coronary artery disease)     s/p 3v CABG 2006, myoview 04/2010 EF 49%, prior inferior/apical infarct, no ischemia, LHC 11/2011 stable anatomy (occluded LAD filled from vein graft, distal LCx occluded prior to OM2, OM2 filled from vein graft, RCA prox 25%, mid 40%, distal 25% lesions, SVG to diagonal occluded) Med Rx  . Chronic diastolic heart failure     hx of cardiorenal syndrome;  Echo 06/2012 EF 50-55%, mod diastolic dysfunction, mild MR, mod TR, mild R/LAE, mild RV dilatation, PASP .  . Carotid stenosis     a. Carotid dopplers R 40-59%, Left 60-79%;   b. carotids 12/13:  0-39% RICA, 60-79% LICA (rpt in 6 mos)  . HLD (hyperlipidemia)   . Mediastinal adenopathy     per CT chest in 2006 with PET scan showing very limited metabolic activity; not felt to have a significant neoplastic potential  . Iron deficiency anemia   . Hypertension   . Diabetes mellitus     insulin dependent   . GERD (gastroesophageal reflux disease)   . AV block, Mobitz 1     Noted 11/2011 in hospital, BB stopped  . Colon polyps   . Diverticulosis   . Pulmonary arterial hypertension     a.  RHC 11/1 PASP 60 mmHg (mean 38); b. RHC 11/15 PA pressure 66/24, PCWP 26 and CO 6.2 => Sildenafil started;   c. Echo bubble study 11/13:  no obvious shunt;   d. VQ scan neg for pulmonary embolism  . CKD (chronic kidney disease) stage 5, GFR less than 15 ml/min     hx of cardiorenal syndrome;  Dr Allena Katz;  AVF recommended and scheduled (patient hesitant to proceed);   Renal US 11/13: diff echogenic kidneys c/w medical renal disease; no hydronephrosis or renal mass  . Hyperparathyroidism, secondary renal   . MGUS (monoclonal gammopathy of unknown significance)     a.  Kappa/Lambda free light chain ratio 11/13:  23.99;  b. Met. Bone survey 11/13:  no osteolytic lesions to suggest MMyeloma  . Atrial flutter     diagnosed 05/2013; Coumadin initiated   Past Surgical History:  Past Surgical History  Procedure Laterality Date  . Appendectomy    . Coronary artery bypass graft  2006    SVG to OM2, SVG to LAD, SVG to DX; (the LIMA did not have good flow and therefore was not used)  . Cardiac catheterization  12/21/2011   HPI:  79 yo with multiple comorbidities brought to [facility] ED after being found down, hypoglycemic, with facial swelling.  Intubated for airway protection, extubated next day.  Dx Acute respiratory failure 2nd to acute encephalopathy.  MRI negative for acute event.    Assessment / Plan / Recommendation Clinical Impression  Pt presents with generalized cognitive deficits with inattention, disorientation to place/time/situation, and decreased awareness, potentially impacting his safety with swallowing.  He is inconsistently attentive to POs, with prolonged oral phase and oral residue noted with no awareness.  Swallow response is present, but delayed, with multiple swallows noted per each bolus.  No overt coughing nor other sympoms of aspiration observed.  Behaviors appear similar to those seen with dementia - however, there is no dementia dx per hx.  Recommend continued regular consistency diet with thin  liquids; meds whole with puree.  Fully supervise with meals for now secondary to cognitive deficits and to maximize safety.      Aspiration Risk  Mild    Diet Recommendation Regular;Thin liquid   Liquid Administration via: Cup Medication Administration: Whole meds with puree Supervision:  (supervise for safety) Compensations: Small sips/bites;Slow rate;Check for pocketing Postural Changes and/or Swallow Maneuvers: Seated upright 90 degrees    Other  Recommendations Oral Care Recommendations: Oral care BID   Follow Up Recommendations   (tba)     Frequency and Duration min 1 x/week  1 week   Pertinent Vitals/Pain No c/o pain    SLP Swallow Goals Patient will utilize recommended strategies during swallow to increase swallowing safety with: Minimal assistance   Swallow Study Prior Functional Status  Type of Home: House Available Help at Discharge: Family;Available PRN/intermittently    General Date of Onset: 06/28/13 HPI: 79 yo with multiple comorbidities brought to [facility] ED after being found down, hypoglycemic, with facial swelling.  Intubated for airway protection, extubated next day.  Dx Acute respiratory failure 2nd to acute encephalopathy.  MRI negative for acute event.  Type of Study: Bedside swallow evaluation Previous Swallow Assessment: none per records Diet Prior to this Study: Regular;Thin liquids Temperature Spikes Noted: No Respiratory Status: Room air History of Recent Intubation: Yes Length of Intubations (days): 1 days Date extubated: 06/29/13 Behavior/Cognition: Alert;Confused Oral Cavity - Dentition: Missing dentition Self-Feeding Abilities: Able to feed self Patient Positioning: Upright in chair Baseline Vocal Quality: Clear Volitional Cough: Strong Volitional Swallow: Able to elicit    Oral/Motor/Sensory Function Overall Oral Motor/Sensory Function: Appears within functional limits for tasks assessed   Ice Chips Ice chips: Within functional limits Presentation: Spoon   Thin Liquid Thin Liquid: Impaired Presentation: Cup;Self Fed Oral Phase Functional Implications: Prolonged oral transit Pharyngeal  Phase Impairments: Multiple swallows    Nectar Thick Nectar Thick Liquid: Not tested   Honey Thick Honey Thick Liquid: Not tested   Puree Puree: Impaired Presentation: Self Fed;Spoon Oral Phase Impairments: Impaired mastication Oral Phase Functional Implications: Oral residue Pharyngeal Phase Impairments: Multiple swallows   Solid   GO    Solid: Impaired Presentation: Self Fed Oral Phase  Functional Implications: Oral residue Pharyngeal Phase Impairments: Multiple swallows      Rande Dario L. Samson Frederic, [place] CCC/SLP Pager [phone]  Blenda Mounts Laurice 07/02/2013,2:08 PM

## 2013-07-02 NOTE — Progress Notes (Signed)
Pt removed 2l O2, recheck level; at rest on RA, 91-92%. Pt refused to have [place] put back on at this time . SBP 145 RR 20. No Whz noted at this time.  Will continue to monitor

## 2013-07-02 NOTE — Progress Notes (Signed)
Pt in bed eating  Breakfast no complaints of pain or sob. Pt speech appears clear this AM than on 10/5. Will continue to monitor

## 2013-07-02 NOTE — Progress Notes (Signed)
Subjective: Awake and alert. Had tried to get out of bed. Has no complaints, denies any cough or respiratory symptoms  Objective: Lab:  Recent Labs  06/30/13 0500 07/01/13 0422  WBC 7.1 5.8  HGB 9.1* 8.8*  HCT 26.9* 26.3*  MCV 86.8 87.1  PLT 129* 130*    Recent Labs  06/30/13 0500 07/01/13 0422 07/02/13 0522  NA 133* 133* 138  K 4.5 4.2 4.0  CL 98 99 104  GLUCOSE 203* 185* 119*  BUN 55* 64* 59*  CREATININE 3.30* 3.52* 2.74*  CALCIUM 9.1 8.9 9.2    Imaging:  Scheduled Meds: . amLODipine  10 mg Oral Daily  . antiseptic oral rinse  15 mL Mouth Rinse BID  . aspirin EC  81 mg Oral Daily  . atorvastatin  20 mg Oral QHS  . carvedilol  6.25 mg Oral BID  . insulin aspart  0-9 Units Subcutaneous TID WC  . insulin detemir  15 Units Subcutaneous QHS  . sodium chloride  3 mL Intravenous Q12H  . vancomycin  1,000 mg Intravenous Q48H  . Warfarin - Pharmacist Dosing Inpatient   Does not apply q1800   Continuous Infusions: . sodium chloride 50 mL/hr at 07/01/13 1629   PRN Meds:.hydrALAZINE   Physical Exam: Filed Vitals:   07/02/13 0527  BP: 171/74  Pulse: 70  Temp: 98.8 F (37.1 C)  Resp: 21    Intake/Output Summary (Last 24 hours) at 07/02/13 1610 Last data filed at 07/02/13 0500  Gross per 24 hour  Intake 1032.5 ml  Output    501 ml  Net  531.5 ml   Gen'l- heavy set elderly AA man in no distress HEENT - C&S clear Cor - 2+ radial RRR PUlm - normal respirations, no rales or wheezes Neuro - no change: mild slurred speech     Assessment/Plan: 1. DM - f/u A1c pending. Restarted Levemir  2. Pulm - normal respirations. Report of blood tinged sputum Plan PA& lat CXR  3. Neuro - no change. PT eval appreciated Plan  OT and SLP consults pending  4. CKD - creatinine down with hydration.  5. Cardiac - stable Plan Wi.. D/c telemetry  6. HTN    BP Readings from Last 3 Encounters:  07/02/13 171/74  06/19/13 139/77  01/04/13 127/69   Variability but  majority controlled.  Dispo - SNF     Illene Regulus Rawlins IM (o) [phone]; (c) [phone] Call-grp - [name] IM  Tele: [phone]  07/02/2013, 7:07 AM

## 2013-07-02 NOTE — Progress Notes (Signed)
NUTRITION FOLLOW UP  Intervention:   Encourage PO intake >75% of meals. No additional needs; RD signing-off  Nutrition Dx:   Inadequate oral intake related to inability to eat as evidenced by NPO status; discontinued- diet advanced to Carb Mod/Dysphagia 3  Goal:   Pt to meet >/= 90% of their estimated nutrition needs; being met  Monitor:   Respiratory Status Weight; 12 lb wt decrease Labs; high BUN, high creatinine, low hemoglobin  I/O's: +2335 ml since admission  Assessment:   Patient with PMH of CAD, HTN, DM, CHF and CKD admitted after being found down, hypoglycemic (glucose 20 on scene) with facial swelling; intubated in ED for airway protection.  Pt extubated 10/3. Pt reports having a good appetite and eating most of his meals. He denies wt loss stating he usually weighs 184 lbs. Weight history shows 12 lb wt loss since admission- ?accuracy. Per nursing notes, pt is eating 75-100% of meals.  Height: Ht Readings from Last 1 Encounters:  06/30/13 5\' 11"  (1.803 m)    Weight Status:   Wt Readings from Last 1 Encounters:  07/02/13 184 lb 8.4 oz (83.7 kg)    Re-estimated needs:  Kcal: 1880-2040 Protein: 80-90 grams Fluid: 2 L/day  Skin: +1 facial edema, abrasion on right eyelid and abrasion to right elbow  Diet Order: Carb Control   Intake/Output Summary (Last 24 hours) at 07/02/13 1215 Last data filed at 07/02/13 1051  Gross per 24 hour  Intake   1620 ml  Output   1126 ml  Net    494 ml    Last BM: 10/6   Labs:   Recent Labs Lab 06/30/13 0500 07/01/13 0422 07/02/13 0522  NA 133* 133* 138  K 4.5 4.2 4.0  CL 98 99 104  CO2 20 22 23   BUN 55* 64* 59*  CREATININE 3.30* 3.52* 2.74*  CALCIUM 9.1 8.9 9.2  GLUCOSE 203* 185* 119*    CBG (last 3)   Recent Labs  07/01/13 2056 07/02/13 0626 07/02/13 1136  GLUCAP 167* 98 142*    Scheduled Meds: . amLODipine  10 mg Oral Daily  . antiseptic oral rinse  15 mL Mouth Rinse BID  . aspirin EC  81 mg Oral  Daily  . atorvastatin  20 mg Oral QHS  . carvedilol  6.25 mg Oral BID  . insulin aspart  0-9 Units Subcutaneous TID WC  . insulin detemir  15 Units Subcutaneous QHS  . sodium chloride  3 mL Intravenous Q12H  . warfarin  5 mg Oral ONCE-1800  . Warfarin - Pharmacist Dosing Inpatient   Does not apply q1800    Continuous Infusions: . sodium chloride 50 mL/hr at 07/02/13 1051    Ian Malkin RD, LDN Inpatient Clinical Dietitian Pager: [phone] After Hours Pager: [phone]

## 2013-07-02 NOTE — Progress Notes (Signed)
Pt lying up at 45degrees sleeping o2 stat 89% RA. SBP 179, sch med given. Pt does appear in RR distress Put on 2l will recheck stat n sbp around 12. Will continue to monitor

## 2013-07-02 NOTE — Progress Notes (Signed)
ANTICOAGULATION CONSULT NOTE - Follow Up Consult  Pharmacy Consult for warfarin Indication: Aflutter  No Known Allergies  Patient Measurements: Height: 5\' 11"  (180.3 cm) Weight: 184 lb 8.4 oz (83.7 kg) IBW/kg (Calculated) : 75.3   Vital Signs: Temp: 98.8 F (37.1 C) (10/06 0527) Temp src: Oral (10/06 0527) BP: 171/74 mmHg (10/06 0527) Pulse Rate: 70 (10/06 0527)  Labs:  Recent Labs  06/30/13 0500 07/01/13 0422 07/02/13 0522  HGB 9.1* 8.8*  --   HCT 26.9* 26.3*  --   PLT 129* 130*  --   LABPROT 31.2* 22.4* 19.1*  INR 3.15* 2.04* 1.66*  CREATININE 3.30* 3.52* 2.74*    Estimated Creatinine Clearance: 22.9 ml/min (by C-G formula based on Cr of 2.74).   Medications:  Scheduled:  . amLODipine  10 mg Oral Daily  . antiseptic oral rinse  15 mL Mouth Rinse BID  . aspirin EC  81 mg Oral Daily  . atorvastatin  20 mg Oral QHS  . carvedilol  6.25 mg Oral BID  . insulin aspart  0-9 Units Subcutaneous TID WC  . insulin detemir  15 Units Subcutaneous QHS  . sodium chloride  3 mL Intravenous Q12H  . Warfarin - Pharmacist Dosing Inpatient   Does not apply q1800    Assessment: [name] who was on warfarin PTA with a home dose of 5mg  daily except 2.5mg  on Saturdays and Sundays. INR was slightly low on admission, but after 1 dose increased rapidly and was held on 10/.4. INR now has decreased nicely to a reading of 1.66 this morning. Patient did receive 4mg  of warfarin yesterday, but the INR reading this morning is more reflective of the missed dose on 10/4 than the dose given last evening. CBC had dropped over a few days, but not bleeding noted.  Goal of Therapy:  INR 2-3 Monitor platelets by anticoagulation protocol: Yes   Plan:  1. Warfarin 5mg  po x1 tonight 2. Daily INR 3. CBC 4. Will provide education to patient and recommendation for possible changes to home dose of warfarin  Lise Pincus D. Uday Jantz, PharmD Clinical Pharmacist Pager: [phone] 07/02/2013 9:53 AM

## 2013-07-02 NOTE — Evaluation (Signed)
Occupational Therapy Evaluation Patient Details Name: [name] MRN: [id number] DOB: [birth year] Today's Date: 07/02/2013 Time: 1478-2956 OT Time Calculation (min): 27 min  OT Assessment / Plan / Recommendation History of present illness Pt admitted after being found down with hypoglycemia and facial swelling.  Pt was intubated for airway protection.   Clinical Impression   Pt presents with impaired cognition including awareness of safety and deficits with poor balance and dependence in mobility and ADL.  Pt will need SNF for ST rehab upon d/c as he has a limited support system.  Will follow acutely.    OT Assessment  Patient needs continued OT Services    Follow Up Recommendations  SNF    Barriers to Discharge Decreased caregiver support    Equipment Recommendations  None recommended by OT    Recommendations for Other Services    Frequency  Min 2X/week    Precautions / Restrictions Precautions Precautions: Fall Precaution Comments: safety awareness, judgement, weakness, new need for assistive device Restrictions Weight Bearing Restrictions: No   Pertinent Vitals/Pain On 2L 02, VSS, no pain    ADL  Eating/Feeding: Set up Where Assessed - Eating/Feeding: Chair Grooming: Wash/dry hands;Wash/dry face;Set up Where Assessed - Grooming: Supported sitting Upper Body Bathing: Minimal assistance Where Assessed - Upper Body Bathing: Unsupported sitting Lower Body Bathing: Moderate assistance Where Assessed - Lower Body Bathing: Unsupported sitting;Supported sit to stand Upper Body Dressing: Minimal assistance Where Assessed - Upper Body Dressing: Unsupported sitting Lower Body Dressing: Moderate assistance Where Assessed - Lower Body Dressing: Unsupported sitting;Supported sit to stand Toilet Transfer: Minimal assistance Toilet Transfer Method: Sit to stand Toilet Transfer Equipment: Bedside commode Toileting - Clothing Manipulation and Hygiene: Moderate  assistance Where Assessed - Toileting Clothing Manipulation and Hygiene: Standing Equipment Used: Gait belt (pt refused use of RW) Transfers/Ambulation Related to ADLs: Pt stating, "get that out of my way," when walker set in front of him. ADL Comments: Pt demonstrated ability to cross foot over opposite knee to donn and doff socks.    OT Diagnosis: Generalized weakness;Cognitive deficits  OT Problem List: Impaired balance (sitting and/or standing);Decreased cognition;Decreased knowledge of use of DME or AE;Decreased safety awareness OT Treatment Interventions: Self-care/ADL training;DME and/or AE instruction;Patient/family education   OT Goals(Current goals can be found in the care plan section) Acute Rehab OT Goals Patient Stated Goal: unable OT Goal Formulation: With patient Time For Goal Achievement: 07/16/13 Potential to Achieve Goals: Good ADL Goals Pt Will Perform Grooming: with min guard assist;standing Pt Will Perform Upper Body Bathing: with supervision;sitting Pt Will Perform Lower Body Bathing: with supervision;sit to/from stand Pt Will Perform Upper Body Dressing: with supervision;sitting Pt Will Perform Lower Body Dressing: with min guard assist;sit to/from stand Pt Will Transfer to Toilet: with min guard assist;ambulating;regular height toilet Pt Will Perform Toileting - Clothing Manipulation and hygiene: with min guard assist;sit to/from stand  Visit Information  Last OT Received On: 07/02/13 Assistance Needed: +1 History of Present Illness: Pt admitted after being found down with hypoglycemia and facial swelling.  Pt was intubated for airway protection.       Prior Functioning     Home Living Family/patient expects to be discharged to:: Private residence Living Arrangements: Children Available Help at Discharge: Family;Available PRN/intermittently Type of Home: House Home Access: Stairs to enter [employer] of Steps: 4 Entrance Stairs-Rails:  Right Home Layout: Two level;Able to live on main level with bedroom/bathroom Home Equipment: None;Other (comment) Prior Function Level of Independence: Independent Communication Communication: No difficulties  Dominant Hand: Right         Vision/Perception Vision - History Patient Visual Report: No change from baseline   Cognition  Cognition Arousal/Alertness: Awake/alert Behavior During Therapy: WFL for tasks assessed/performed Overall Cognitive Status: Impaired/Different from baseline Area of Impairment: Safety/judgement;Awareness;Memory Safety/Judgement: Decreased awareness of safety;Decreased awareness of deficits Awareness: Intellectual General Comments: pt without awareness of poor balance and need for assist with OOB activities.    Extremity/Trunk Assessment Upper Extremity Assessment Upper Extremity Assessment: Overall WFL for tasks assessed (L shoulder slightly slower with FF ) Lower Extremity Assessment Lower Extremity Assessment: Overall WFL for tasks assessed Cervical / Trunk Assessment Cervical / Trunk Assessment: Normal     Mobility Bed Mobility Bed Mobility: Supine to Sit Supine to Sit: 5: Supervision;HOB elevated;With rails Transfers Transfers: Sit to Stand;Stand to Sit Sit to Stand: 4: Min assist;With upper extremity assist;From bed;From chair/3-in-1 Stand to Sit: 4: Min assist;With upper extremity assist;To chair/3-in-1 Details for Transfer Assistance: decreased control of descent     Exercise     Balance High Level Balance High Level Balance Comments: in general pt dependent upon external support to maintain balance static and dynamic   End of Session OT - End of Session Activity Tolerance: Patient tolerated treatment well Patient left: in chair;with call bell/phone within reach;with chair alarm set Nurse Communication:  (may give pt cranberry juice)  GO     Evern Bio 07/02/2013, 11:43 AM [phone]

## 2013-07-03 ENCOUNTER — Inpatient Hospital Stay (HOSPITAL_COMMUNITY): Payer: Medicare Other

## 2013-07-03 DIAGNOSIS — E1149 Type 2 diabetes mellitus with other diabetic neurological complication: Secondary | ICD-10-CM

## 2013-07-03 LAB — GLUCOSE, CAPILLARY
Glucose-Capillary: 48 mg/dL — ABNORMAL LOW (ref 70–99)
Glucose-Capillary: 60 mg/dL — ABNORMAL LOW (ref 70–99)
Glucose-Capillary: 87 mg/dL (ref 70–99)
Glucose-Capillary: 78 mg/dL (ref 70–99)
Glucose-Capillary: 105 mg/dL — ABNORMAL HIGH (ref 70–99)
Glucose-Capillary: 168 mg/dL — ABNORMAL HIGH (ref 70–99)

## 2013-07-03 LAB — PROTIME-INR: INR: 1.53 — ABNORMAL HIGH (ref 0.00–1.49)

## 2013-07-03 MED ORDER — HYDRALAZINE HCL 25 MG PO TABS
25.0000 mg | ORAL_TABLET | ORAL | Status: DC | PRN
  Filled 2013-07-03: qty 1

## 2013-07-03 MED ORDER — WARFARIN SODIUM 7.5 MG PO TABS
7.5000 mg | ORAL_TABLET | Freq: Once | ORAL | Status: AC
  Administered 2013-07-03: 17:00:00 7.5 mg via ORAL
  Filled 2013-07-03: qty 1

## 2013-07-03 NOTE — Progress Notes (Signed)
Speech Language Pathology Dysphagia Treatment Patient Details Name: [name] MRN: [id number] DOB: 06-May-1931 Today's Date: 07/03/2013 Time: 1191-4782 SLP Time Calculation (min): 22 min  Assessment / Plan / Recommendation Clinical Impression  F/u after yesterday's swallow evaluation.  Pt with continued cognitive deficits impacting safety and necessitating supervision with meals.  Requires cueing to monitor rate, bolus size, clear oral cavity, and maintain attentiveness to POs.  Lung sounds are diminished but clear.  Recommend continuing current diet - full supervision with meals here and when D/Cd to SNF.      Diet Recommendation  Continue with Current Diet: Regular;Thin liquid    SLP Plan All goals met   Pertinent Vitals/Pain No c/o pain   Swallowing Goals  SLP Swallowing Goals Patient will utilize recommended strategies during swallow to increase swallowing safety with: Minimal assistance; full supervision  General Temperature Spikes Noted: Yes Respiratory Status: Supplemental O2 delivered via (comment) (nasal cannula 2l) Behavior/Cognition: Alert;Confused Oral Cavity - Dentition: Missing dentition Patient Positioning: Upright in chair  Oral Cavity - Oral Hygiene Does patient have any of the following "at risk" factors?: Oxygen therapy - cannula, mask, simple oxygen devices Patient is AT RISK - Oral Care Protocol followed (see row info): Yes   Dysphagia Treatment Treatment focused on: Skilled observation of diet tolerance Treatment Methods/Modalities: Skilled observation Patient observed directly with PO's: Yes Type of PO's observed: Regular;Thin liquids Feeding: Needs assist Liquids provided via: Cup;Straw Oral Phase Signs & Symptoms: Prolonged bolus formation;Prolonged oral phase Pharyngeal Phase Signs & Symptoms: Multiple swallows;Suspected delayed swallow initiation Type of cueing: Verbal Amount of cueing: Minimal   GO    Angelmarie Ponzo L. Samson Frederic, [place] CCC/SLP Pager  [phone]  Blenda Mounts Laurice 07/03/2013, 11:54 AM

## 2013-07-03 NOTE — Progress Notes (Signed)
Patient resting in bed.

## 2013-07-03 NOTE — Progress Notes (Signed)
Hypoglycemic Event  CBG: 69 at 1131am  Treatment: 15 GM carbohydrate snack  Symptoms: None  Follow-up CBG: Time:1149 CBG Result:78  Possible Reasons for Event: Unknown  Comments/MD notified:    Bell, Cai  Remember to initiate Hypoglycemia Order Set & complete

## 2013-07-03 NOTE — Progress Notes (Signed)
Utilization Review Completed.   Erza Mothershead, RN, BSN Nurse Case Manager  [phone]  

## 2013-07-03 NOTE — Progress Notes (Signed)
Pt alert to self, pt had poor po for breakfast, pt had episode of low blood sugar but was asymptomatic, pt had good lunch, pt oob with PT, sitting in chair tolerates well, family at bedside, pt stable, will continue to monitor

## 2013-07-03 NOTE — Progress Notes (Signed)
Physical Therapy Treatment Patient Details Name: [name] MRN: [id number] DOB: 09-26-1931 Today's Date: 07/03/2013 Time: 9562-1308 PT Time Calculation (min): 26 min  PT Assessment / Plan / Recommendation  History of Present Illness Pt admitted after being found down with hypoglycemia and facial swelling.  Pt was intubated for airway protection.   PT Comments   Pt progressing with gait and willing to use RW today. Pt required education for time and situation at beginning of session and cues for safety and sequence with activity. Pt on 5L O2 on arrival but sats 90% on RA with ambulation 89-92%. Will continue to follow.   Follow Up Recommendations  SNF;Supervision/Assistance - 24 hour     Does the patient have the potential to tolerate intense rehabilitation     Barriers to Discharge        Equipment Recommendations       Recommendations for Other Services    Frequency     Progress towards PT Goals Progress towards PT goals: Progressing toward goals  Plan Current plan remains appropriate    Precautions / Restrictions Precautions Precautions: Fall   Pertinent Vitals/Pain No pain HR 70    Mobility  Bed Mobility Bed Mobility: Supine to Sit Supine to Sit: 5: Supervision;HOB elevated;With rails Details for Bed Mobility Assistance: cueing for sequence Transfers Sit to Stand: 4: Min assist;With upper extremity assist;From bed Stand to Sit: 4: Min assist;With upper extremity assist;To chair/3-in-1 Details for Transfer Assistance: cueing for sequence and hand placement Ambulation/Gait Ambulation/Gait Assistance: 4: Min assist Ambulation Distance (Feet): 90 Feet Assistive device: Rolling walker Ambulation/Gait Assistance Details: cueing to look up, step into RW and for safety with avoiding obstacles Gait Pattern: Step-through pattern;Decreased stride length;Trunk flexed;Narrow base of support Gait velocity: decreased Stairs: No    Exercises General Exercises - Lower  Extremity Long Arc Quad: AROM;Both;20 reps;Seated Hip ABduction/ADduction: AROM;Both;20 reps;Seated Hip Flexion/Marching: AROM;Both;20 reps;Seated   PT Diagnosis:    PT Problem List:   PT Treatment Interventions:     PT Goals (current goals can now be found in the care plan section)    Visit Information  Last PT Received On: 07/03/13 Assistance Needed: +1 History of Present Illness: Pt admitted after being found down with hypoglycemia and facial swelling.  Pt was intubated for airway protection.    Subjective Data      Cognition  Cognition Arousal/Alertness: Awake/alert Behavior During Therapy: WFL for tasks assessed/performed Overall Cognitive Status: Impaired/Different from baseline Area of Impairment: Safety/judgement;Awareness;Memory;Orientation Orientation Level: Situation;Time Memory: Decreased short-term memory Safety/Judgement: Decreased awareness of safety;Decreased awareness of deficits    Balance     End of Session PT - End of Session Equipment Utilized During Treatment: Gait belt Activity Tolerance: Patient tolerated treatment well Patient left: in chair;with call bell/phone within reach;with nursing/sitter in room;with chair alarm set Nurse Communication: Mobility status   GP     Toney Sang Beth 07/03/2013, 12:37 PM Delaney Meigs, PT [phone]

## 2013-07-03 NOTE — Progress Notes (Signed)
ANTICOAGULATION CONSULT NOTE - Follow Up Consult  Pharmacy Consult for warfarin Indication: AFlutter  No Known Allergies  Patient Measurements: Height: 5\' 11"  (180.3 cm) Weight: 211 lb 3.2 oz (95.8 kg) IBW/kg (Calculated) : 75.3   Vital Signs: Temp: 100 F (37.8 C) (10/07 0318) Temp src: Oral (10/07 0318) BP: 175/74 mmHg (10/07 0318) Pulse Rate: 66 (10/07 0318)  Labs:  Recent Labs  07/01/13 0422 07/02/13 0522 07/03/13 0725  HGB 8.8*  --   --   HCT 26.3*  --   --   PLT 130*  --   --   LABPROT 22.4* 19.1* 18.0*  INR 2.04* 1.66* 1.53*  CREATININE 3.52* 2.74*  --     Estimated Creatinine Clearance: 25.4 ml/min (by C-G formula based on Cr of 2.74).   Assessment: 76 YOM who continues on warfarin for AFlutter. His home dose was 5mg  daily except 2.5mg  on Sundays and Saturdays. Warfarin was resumed 10/3, but dose was held 10/4 as INR made a large jump. Warfarin was given 10/5 and 10/6, but INR continues to decrease- this morning it is 1.53. His last CBC was 10/5 and showed a slight decrease in Hgb and plts. No overt bleeding noted.  Goal of Therapy:  INR 2-3 Monitor platelets by anticoagulation protocol: Yes   Plan:  1. Warfarin 7.5mg  po x1 tonight 2. CBC tomorrow to assess hgb and plts 3. Daily PT/INR 4. Follow for discharge planning- if patient is discharged tomorrow, would recommend he takes warfarin 5mg  daily with an INR check on Monday 10/13  Aquil Duhe D. Keondre Markson, PharmD Clinical Pharmacist Pager: [phone] 07/03/2013 10:00 AM

## 2013-07-03 NOTE — Progress Notes (Signed)
Pt had change in breathing patter, 2 regular breaths then one deep, O2 sats WNL, lungs diminished, Dr. Debby Bud notified chest x ray ordered, will continue to monitor

## 2013-07-03 NOTE — Progress Notes (Signed)
Subjective: OT and SLP evaluations appreciated. No new events reported. Easily awakened  Objective: Lab:  Recent Labs  07/01/13 0422  WBC 5.8  HGB 8.8*  HCT 26.3*  MCV 87.1  PLT 130*    Recent Labs  07/01/13 0422 07/02/13 0522  NA 133* 138  K 4.2 4.0  CL 99 104  GLUCOSE 185* 119*  BUN 64* 59*  CREATININE 3.52* 2.74*  CALCIUM 8.9 9.2    Imaging:  Scheduled Meds: . amLODipine  10 mg Oral Daily  . antiseptic oral rinse  15 mL Mouth Rinse BID  . aspirin EC  81 mg Oral Daily  . atorvastatin  20 mg Oral QHS  . carvedilol  6.25 mg Oral BID  . insulin aspart  0-9 Units Subcutaneous TID WC  . insulin detemir  15 Units Subcutaneous QHS  . sodium chloride  3 mL Intravenous Q12H  . Warfarin - Pharmacist Dosing Inpatient   Does not apply q1800   Continuous Infusions: . sodium chloride 50 mL/hr at 07/03/13 0511   PRN Meds:.hydrALAZINE   Physical Exam: Filed Vitals:   07/03/13 0318  BP: 175/74  Pulse: 66  Temp: 100 F (37.8 C)  Resp: 21  gen'l - elderly AA man in no distress HEENT- healing abrasion right face. C&S clear Cor - RRR Pulm - normal respirations Neuro - no changes      Assessment/Plan: 1. DM - A1c elevated. Plan Continue present regimen  2. PUlm - doing well. At risk for aspiration  3. Neuro - no acute injury found. Definitely changed - dementia vs old CVA event  4. CKD - Cr was trendint down  Plan F/u Bmet in AM  5. Cardiac - stable  6. HTN-  BP Readings from Last 3 Encounters:  07/03/13 175/74  06/19/13 139/77  01/04/13 127/69   Plan  For excursions will change to po hydralazine  Dispo - SNF - should be ready to go tomorrow, 10/8   Illene Regulus [place] [facility] (o) [phone]; (c) [phone] Call-grp - [name] IM  Tele: [phone]  07/03/2013, 6:16 AM

## 2013-07-03 NOTE — Progress Notes (Signed)
Hypoglycemic Event  CBG: 48  Treatment: 15 GM carbohydrate snack  Symptoms: None  Follow-up CBG: Time:0715 CBG Result86  Possible Reasons for Event: Unknown  Comments/MD notified:no    Ron Beske, Chucky May  Remember to initiate Hypoglycemia Order Set & complete

## 2013-07-03 NOTE — Discharge Instructions (Signed)

## 2013-07-04 DIAGNOSIS — E1142 Type 2 diabetes mellitus with diabetic polyneuropathy: Secondary | ICD-10-CM

## 2013-07-04 LAB — GLUCOSE, CAPILLARY
Glucose-Capillary: 69 mg/dL — ABNORMAL LOW (ref 70–99)
Glucose-Capillary: 94 mg/dL (ref 70–99)
Glucose-Capillary: 115 mg/dL — ABNORMAL HIGH (ref 70–99)
Glucose-Capillary: 193 mg/dL — ABNORMAL HIGH (ref 70–99)

## 2013-07-04 LAB — PROTIME-INR: Prothrombin Time: 18.7 seconds — ABNORMAL HIGH (ref 11.6–15.2)

## 2013-07-04 MED ORDER — FUROSEMIDE 40 MG PO TABS
40.0000 mg | ORAL_TABLET | Freq: Four times a day (QID) | ORAL | Status: AC
  Administered 2013-07-04 (×3): 40 mg via ORAL
  Filled 2013-07-04 (×3): qty 1

## 2013-07-04 MED ORDER — WARFARIN SODIUM 7.5 MG PO TABS
7.5000 mg | ORAL_TABLET | Freq: Once | ORAL | Status: AC
  Administered 2013-07-04: 18:00:00 7.5 mg via ORAL
  Filled 2013-07-04: qty 1

## 2013-07-04 NOTE — Progress Notes (Signed)
Physical Therapy Treatment Patient Details Name: [name] MRN: [id number] DOB: 08-Jun-1931 Today's Date: 07/04/2013 Time: 7253-6644 PT Time Calculation (min): 24 min  PT Assessment / Plan / Recommendation  History of Present Illness Pt admitted after being found down with hypoglycemia and facial swelling.  Pt was intubated for airway protection.   PT Comments   *Pt groggy, increased time to respond to questions, but participated with PT. Ambulated 100' with RW and performed BLE exercises. Pt stated he's going home, however chart states DC plan is SNF. **  Follow Up Recommendations  SNF;Supervision/Assistance - 24 hour     Does the patient have the potential to tolerate intense rehabilitation     Barriers to Discharge        Equipment Recommendations  Rolling walker with 5" wheels    Recommendations for Other Services OT consult  Frequency Min 3X/week   Progress towards PT Goals Progress towards PT goals: Progressing toward goals  Plan Current plan remains appropriate    Precautions / Restrictions Precautions Precautions: Fall Precaution Comments: safety awareness, judgement, weakness, new need for assistive device Restrictions Weight Bearing Restrictions: No   Pertinent Vitals/Pain *0/10 pain SaO2 98% on 2L O2 88% on RA with walking**    Mobility  Bed Mobility Bed Mobility: Supine to Sit Supine to Sit: 5: Supervision;HOB elevated;With rails Details for Bed Mobility Assistance: cueing for sequence Transfers Transfers: Sit to Stand;Stand to Sit Sit to Stand: 4: Min assist;With upper extremity assist;From bed Stand to Sit: 4: Min assist;With upper extremity assist;To chair/3-in-1 Details for Transfer Assistance: cueing for sequence and hand placement; uncontrolled descent to chair Ambulation/Gait Ambulation/Gait Assistance: 4: Min guard Ambulation Distance (Feet): 100 Feet Assistive device: Rolling walker Gait Pattern: Step-through pattern;Decreased stride  length Gait velocity: decreased Stairs: No    Exercises General Exercises - Lower Extremity Ankle Circles/Pumps: AROM;Both;10 reps;Seated Heel Slides: AAROM;Both;10 reps;Supine Hip ABduction/ADduction: AAROM;Both;10 reps;Supine   PT Diagnosis:    PT Problem List:   PT Treatment Interventions:     PT Goals (current goals can now be found in the care plan section) Acute Rehab PT Goals Patient Stated Goal: to pay my bills PT Goal Formulation: Patient unable to participate in goal setting Time For Goal Achievement: 07/15/13 Potential to Achieve Goals: Good  Visit Information  Last PT Received On: 07/04/13 Assistance Needed: +1 History of Present Illness: Pt admitted after being found down with hypoglycemia and facial swelling.  Pt was intubated for airway protection.    Subjective Data  Patient Stated Goal: to pay my bills   Cognition  Cognition Arousal/Alertness: Awake/alert;Lethargic (awake but slow to respond to questions, groggy) Behavior During Therapy: [facility] for tasks assessed/performed Overall Cognitive Status: Impaired/Different from baseline Area of Impairment: Safety/judgement;Awareness;Memory;Orientation Orientation Level: Situation;Time Memory: Decreased short-term memory Safety/Judgement: Decreased awareness of safety    Balance  High Level Balance High Level Balance Comments: in general pt dependent upon external support to maintain balance static and dynamic  End of Session PT - End of Session Equipment Utilized During Treatment: Gait belt Activity Tolerance: Patient tolerated treatment well Patient left: in chair;with call bell/phone within reach;with chair alarm set Nurse Communication: Mobility status   GP     Ralene Bathe Kistler 07/04/2013, 10:38 AM [phone]

## 2013-07-04 NOTE — Progress Notes (Signed)
ANTICOAGULATION CONSULT NOTE - Follow Up Consult  Pharmacy Consult for warfarin Indication: AFlutter  No Known Allergies  Patient Measurements: Height: 5\' 11"  (180.3 cm) Weight: 173 lb 11.6 oz (78.8 kg) IBW/kg (Calculated) : 75.3   Vital Signs: Temp: 99 F (37.2 C) (10/08 0700) Temp src: Oral (10/08 0700) BP: 151/97 mmHg (10/08 0927) Pulse Rate: 67 (10/08 0927)  Labs:  Recent Labs  07/02/13 0522 07/03/13 0725 07/04/13 0650  LABPROT 19.1* 18.0* 18.7*  INR 1.66* 1.53* 1.61*  CREATININE 2.74*  --   --     Estimated Creatinine Clearance: 22.9 ml/min (by C-G formula based on Cr of 2.74).   Assessment: 30 YOM who continues on warfarin for AFlutter. His home dose was 5mg  daily except 2.5mg  on Sundays and Saturdays. Warfarin was resumed 10/3, but dose was held 10/4 as INR made a large jump. Now INR is slow to increase- value is 1.61 today. Last CBC was 10/5- stable at that point. No overt bleeding noted.  Goal of Therapy:  INR 2-3 Monitor platelets by anticoagulation protocol: Yes    Plan:  1. Warfarin 7.5mg  po x1 tonight 2. CBC tomorrow to assess hgb and plts 3. Daily PT/INR 4. Follow for discharge planning-if d/c'd today or tomorrow, recommend patient takes warfarin 5mg  po daily with an INR check 1 week after discharge- he may need a large dose than he was taking PTA based on past few INRs, but do not want to aggressively increased based on only a few data points  Paraskevi Funez D. Elonda Giuliano, PharmD Clinical Pharmacist Pager: [phone] 07/04/2013 11:55 AM

## 2013-07-04 NOTE — Progress Notes (Signed)
Inpatient Diabetes Program Recommendations  AACE/ADA: New Consensus Statement on Inpatient Glycemic Control (2013)  Target Ranges:  Prepandial:   less than 140 mg/dL      Peak postprandial:   less than 180 mg/dL (1-2 hours)      Critically ill patients:  140 - 180 mg/dL   Fasting hypoglycemia  Inpatient Diabetes Program Recommendations Insulin - Basal: Please decrease to 5 units levemir.  Fasting glucose levels low times past 2 days in 40's and 60's.  Thank you, Lenor Coffin, RN, CNS, Diabetes Coordinator [phone])

## 2013-07-04 NOTE — Clinical Social Work Note (Signed)
CSW faxed clinicals to [facility] to obtain SNF authorization.  Confirmation of receipt received.  CSW called and left message with Steward Drone at [facility] to verify nothing else was needed. CSW awaits a return call and authorization.  Pt SNF choice is [facility].  Vickii Penna, LCSWA [phone]  Clinical Social Work

## 2013-07-04 NOTE — Progress Notes (Signed)
CSW spoke with Marylene Land at [facility]- bed will be available for patient when medically stable and insurance auth obtained.  Awaiting MD's determination re: stability.  Please advise. Fl2 on chart for signature. Lorri Frederick. [place]  [phone]

## 2013-07-04 NOTE — Progress Notes (Signed)
Subjective: [name] is awake and alert. Speech is better/clearer than in days past. He voices no complaints  Objective: Lab: No results found for this basename: WBC, NEUTROABS, HGB, HCT, MCV, PLT,  in the last 72 hours  Recent Labs  07/02/13 0522  NA 138  K 4.0  CL 104  GLUCOSE 119*  BUN 59*  CREATININE 2.74*  CALCIUM 9.2    Imaging: CXR 10/7: FINDINGS:  Worsened bilateral interstitial and airspace opacities favoring  acute pulmonary edema. Obscuration of the lung bases. Underlying  cardiomegaly noted.  IMPRESSION:  1. Worsened bilateral pulmonary edema, with increasing obscuration  of both hemidiaphragms.  Scheduled Meds: . amLODipine  10 mg Oral Daily  . antiseptic oral rinse  15 mL Mouth Rinse BID  . aspirin EC  81 mg Oral Daily  . atorvastatin  20 mg Oral QHS  . carvedilol  6.25 mg Oral BID  . insulin aspart  0-9 Units Subcutaneous TID WC  . insulin detemir  15 Units Subcutaneous QHS  . sodium chloride  3 mL Intravenous Q12H  . Warfarin - Pharmacist Dosing Inpatient   Does not apply q1800   Continuous Infusions:  PRN Meds:.hydrALAZINE   Physical Exam: Filed Vitals:   07/04/13 0700  BP: 159/70  Pulse: 57  Temp: 99 F (37.2 C)  Resp: 18    Intake/Output Summary (Last 24 hours) at 07/04/13 0841 Last data filed at 07/04/13 0326  Gross per 24 hour  Intake    540 ml  Output    200 ml  Net    340 ml   Gen'l; heavyset AA man in no distress Cor- 2+ radial, RRR Pulm - no increased WOB, bibasilar rales - feint Neuro - awake, alert, speech clear      Assessment/Plan: 1. DM -  CBG (last 3)   Recent Labs  07/03/13 1612 07/03/13 2114 07/04/13 0626  GLUCAP 105* 168* 69*    Will continue present regimen  2. Pulm - alter respirations yesterday. X-ray with worsening edema Plan Increase oral lasix to diurese excessive fluid  3. Neuro - in speaking with son he has noted change over the past year but no acute events. No focal findings. Question of  multi-infarct dementia or changes related to chronic disease  4. CKD - last creatinine improved Plan F/u Bmet in AM  5. Cardiac - increased pulmonary edema - c/w acute on chronic diastolic heart failure. Last 2 D echo Oct '13: Study Conclusions  - Left ventricle: The cavity size was normal. Wall thickness was increased in a pattern of mild LVH. Systolic function was normal. The estimated ejection fraction was in the range of 50% to 55%. There is akinesis of the posterior myocardium. Features are consistent with a pseudonormal left ventricular filling pattern, with concomitant abnormal relaxation and increased filling pressure (grade 2 diastolic dysfunction). - Mitral valve: Mild regurgitation. - Left atrium: The atrium was mildly dilated. - Right ventricle: The cavity size was mildly dilated. - Right atrium: The atrium was mildly dilated. - Tricuspid valve: Moderate regurgitation. - Pulmonary arteries: Systolic pressure was severely increased. PA peak pressure: 76mm Hg (S).  Plan Continue heart regime  Diurese as above  F/u 2 D echo  6. HTN-  BP Readings from Last 3 Encounters:  07/04/13 159/70  06/19/13 139/77  01/04/13 127/69   Dispo - have confirmed with the patient that he is [facility] and have informed son of this. Patient asked about Code status - full code.  I have advised him to inform his children of his health condition(s) D/C delayed due to pulmonary edema.   Illene Regulus [place] [facility] (o) [phone]; (c) [phone] Call-grp - [name] IM  Tele: [phone]  07/04/2013, 8:40 AM

## 2013-07-04 NOTE — Progress Notes (Signed)
Pt oob to chair, pt has good appetite today, sugars have been WNL and no insulin needed, pt stable will continue to monitor

## 2013-07-04 NOTE — Progress Notes (Signed)
CSW spoke with patient's son [name]. He related that he has spoken to Dr. Debby Bud who indicated that patient was agreeable to a short term stay. (patient had declined with CSW yesterday).  Dr. Debby Bud will speak with patient again today. Family is agreeable to SNF if patient agrees;  Want [facility] if possible and will initate bed search process and request [facility] authorization.  CSW will monitor and assist with d/c planning.  Lorri Frederick. [place]  [phone]

## 2013-07-05 DIAGNOSIS — I369 Nonrheumatic tricuspid valve disorder, unspecified: Secondary | ICD-10-CM

## 2013-07-05 DIAGNOSIS — R0609 Other forms of dyspnea: Secondary | ICD-10-CM

## 2013-07-05 DIAGNOSIS — E119 Type 2 diabetes mellitus without complications: Secondary | ICD-10-CM

## 2013-07-05 DIAGNOSIS — N184 Chronic kidney disease, stage 4 (severe): Secondary | ICD-10-CM

## 2013-07-05 DIAGNOSIS — R0989 Other specified symptoms and signs involving the circulatory and respiratory systems: Secondary | ICD-10-CM

## 2013-07-05 LAB — CULTURE, BLOOD (ROUTINE X 2): Culture: NO GROWTH

## 2013-07-05 LAB — BASIC METABOLIC PANEL
Calcium: 9 mg/dL (ref 8.4–10.5)
GFR calc Af Amer: 28 mL/min — ABNORMAL LOW (ref >90)
GFR calc non Af Amer: 24 mL/min — ABNORMAL LOW (ref >90)
Potassium: 4.1 mEq/L (ref 3.5–5.1)
Sodium: 139 mEq/L (ref 135–145)

## 2013-07-05 LAB — CBC
Hemoglobin: 8.9 g/dL — ABNORMAL LOW (ref 13.0–17.0)
MCV: 87.4 fL (ref 78.0–100.0)
Platelets: 142 10*3/uL — ABNORMAL LOW (ref 150–400)
RBC: 3.01 MIL/uL — ABNORMAL LOW (ref 4.22–5.81)
WBC: 4.7 10*3/uL (ref 4.0–10.5)

## 2013-07-05 LAB — GLUCOSE, CAPILLARY
Glucose-Capillary: 102 mg/dL — ABNORMAL HIGH (ref 70–99)
Glucose-Capillary: 100 mg/dL — ABNORMAL HIGH (ref 70–99)
Glucose-Capillary: 177 mg/dL — ABNORMAL HIGH (ref 70–99)
Glucose-Capillary: 153 mg/dL — ABNORMAL HIGH (ref 70–99)

## 2013-07-05 LAB — PROTIME-INR
INR: 1.69 — ABNORMAL HIGH (ref 0.00–1.49)
Prothrombin Time: 19.4 seconds — ABNORMAL HIGH (ref 11.6–15.2)

## 2013-07-05 MED ORDER — INSULIN DETEMIR 100 UNIT/ML ~~LOC~~ SOLN: 15.0000 [IU] | Freq: Every day | SUBCUTANEOUS | Status: DC

## 2013-07-05 MED ORDER — FUROSEMIDE 80 MG PO TABS: 240.0000 mg | ORAL_TABLET | Freq: Two times a day (BID) | ORAL | Status: DC

## 2013-07-05 MED ORDER — WARFARIN SODIUM 7.5 MG PO TABS
7.5000 mg | ORAL_TABLET | Freq: Once | ORAL | Status: AC
  Administered 2013-07-05: 18:00:00 7.5 mg via ORAL
  Filled 2013-07-05: qty 1

## 2013-07-05 NOTE — Discharge Summary (Signed)
NAME:  Parker, Cooke NO.:  [id number]  MEDICAL RECORD NO.:  [id number]  LOCATION:  4E13C                        FACILITY:  [facility]  PHYSICIAN:  Rosalyn Gess. Norins, MD  DATE OF BIRTH:  [birth year]  DATE OF ADMISSION:  06/28/2013 DATE OF DISCHARGE:  07/05/2013                              DISCHARGE SUMMARY   ADMITTING DIAGNOSES: 1. Profound hypoglycemia. 2. Intubation for inability to protect airway. 3. Hypertension. 4. Known coronary artery disease. 5. Chronic kidney disease. 6. History of cerebrovascular accident.  DISCHARGE DIAGNOSES: 1. Profound hypoglycemia. 2. Intubation for inability to protect airway. 3. Hypertension. 4. Known coronary artery disease. 5. Chronic kidney disease. 6. History of cerebrovascular accident.  PROCEDURES/IMAGING: 1. Portable chest x-ray, June 28, 2013, which showed mildly enlarged     heart.  Stable surgical changes from triple bypass surgery.     Vascular congestion and mild interstitial changes suggesting CHF. 2. Chest x-ray, post intubation. 3. CT of the head without contrast, June 28, 2013, with diffuse     atrophy and moderate low attenuation in the deep white matter,     unchanged from previous study.  Tiny chronic appearing lacunar     infarct in left thalamus, stable.  Mild-to-moderate chronic     inflammatory change in the sphenoid, maxillary, frontal, and     ethmoid air cells. 4. MRI of the brain, June 30, 2013, which showed advanced atrophy,     chronic microvascular ischemic changes, and sequelae of     hypertensive cerebral vascular disease.  No acute intracranial     findings were noted. 5. Chest x-ray, July 02, 2013, the patient is extubated.  Persistent     perihilar interstitial opacities compatible with edema. 6. Portable chest x-ray, July 03, 2013, with worsened bilateral     pulmonary edema with increasing obscuration of both hemidiaphragms.  HISTORY OF PRESENT ILLNESS:  [name] is an  79 year old African American gentleman with multiple comorbidities, who was found by his family down at home for an undetermined period of time, but more than several hours.  EMS was called, and the patient was found to be profoundly hypoglycemic with a capillary blood glucose in the 60 range. The patient was transported to [facility] Emergency Department, where he was seen and evaluated.  The patient was somewhat obtunded, had significant hypoglycemia and was felt to be unable to protect his airway, was subsequently intubated.  The patient was then admitted by the Critical Care Service for further management.  HOSPITAL COURSE: 1. Pulmonary:  The patient was intubated.  He made a relatively rapid     recovery as his hypoglycemic state resolved, and he was extubated     on the second full hospital day.  He had no further respiratory     difficulty and was able to do well on O2 via nasal cannula. 2. Diabetes:  The patient was resuscitated from his hypoglycemia.  He     was able to take a diet.  The patient did have a hemoglobin A1c     performed on July 02, 2013, and was 7.6%.  The patient was     started back on his home regimen for  diabetic management including     Levemir for basal insulin therapy.  The patient did well with no     additional hypoglycemic episodes.  He was seen by nutritional     consult with no particular needs identified.  3. Neuro:  The patient was last seen by his primary care physician in     June of 2013, and was noted at this admission to have significant     mental status changes.  In consultation with the patient's son, it     was apparent the patient had become a little bit slower in his     speech and activities.  He had no specific focal abnormalities.     During the stay, the patient did seem to have some difficulties     with swallow and was seen by Speech Pathology who found the patient     had generalized cognitive deficit with inattention,  disorientation     to place and time, decreased awareness.  He was inattentive to his     p.o.'s with prolonged oral phase and oral residue was noted with     the patient not being aware.  Swallow response was present but     delayed with multiple swallows noted per each bolus.  There was no     overt coughing or other symptoms of aspiration.  Symptoms were     similar to those seen with dementia related type patient.  It was     recommended he have a regular consistency diet with thin liquids, medicationa with puree.  He will need to be fully supervised with meals due     to his secondary cognitive deficit and inattention.  Follow up     visit with Speech Pathology revealed no significant changes. His     diet was to continue unchanged with need for supervision with meals.  Of note, the patient did have an MRI, which showed no significant changes with significant atrophy, question of an old small lacunar thalamic infarct as noted above.  The patient was seen by PT and OT, who did recommend the patient to have short-term skilled care to regain strength and functionality in regard to being able to manage independently.  3. CKD IV.  The patient had been followed as an outpatient for chronic     kidney failure.  During his hospitalization, his creatinine rose to     a high of 3.52.  By last laboratory, July 02, 2013, the     creatinine was down to 2.74, approaching his baseline.  The patient     has been followed as an outpatient by the Nephrology Service, Dr. Allena Katz, and     he should keep his appointments as scheduled.  4. Cardiac:  The patient has been followed by the Cardiology Service     for atrial fib/flutter as well as diastolic heart failure.  Last 2D     echo from October 2013 showed an EF of 50% to 55%.  Akinesis of the     posterior myocardium.  Features consistent with pseudonormal left     ventricular filling pattern with concomitant abnormal relaxation     and filling  pressure consistent with grade 2 diastolic dysfunction.     Repeat 2D echo ordered at 0849 hours on July 04, 2013, is not     done by the time of this dictation.  The patient did develop progressive pulmonary edema.  X-ray revealed mild edema at the time  of admission, and this became worse on October 7. The patient was given high-dose oral Lasix with diuresis of almost a liter.  The patient's fluid balance was positive by almost 3 L for this hospitalization.  He has had no respiratory distress, and his exam was unremarkable for any significant rales or wheezes.  Plan is for the patient to continue his home cardiac medications.  We will make sure he continues on Lasix b.i.d. for an additional 3 days and then resume Lasix once daily.  The patient will follow up with Cardiology with his scheduled appointment to see Dr. [name] on October 13.  We will ask facility to make sure he keeps this appointment.  5. Hypertension.  The patient's blood pressure has been stable.  He     will continue his home medications.  Disposition.  The patient will be transferred for short-term     skilled care for PT/OT rehabilitation.  He will be able to follow     all facility protocols.  He may have leave of absence with family.  The patient does need to keep his appointment with Dr. Olga Millers, [facility] at the [facility] location.  The patient will need to see Dr. Debby Bud within 7 days of discharge from skilled care facility.  DISCHARGE EXAMINATION:  VITAL SIGNS:  Temperature was 97.2, blood pressure 162/70, pulse was 66, respirations 20, oxygen saturation was 100% on 2 L.  Weight on October 8 was 173 pounds, which we will consider a wet weight. GENERAL APPEARANCE:  This is a heavy set African American gentleman, who looks his stated age, in no acute distress. HEENT:  The patient has a healing abrasion at the right periorbital region, but no other signs of trauma.   Conjunctivae and sclerae, a bit muddy.  He has very early arcus senilis.  Pupils were equal. NECK:  Supple.  There is no thyromegaly. CARDIOVASCULAR:  Radial pulses 2+.  Precordium was quiet.  His heart sounds were distant but did sound regular on exam. PULMONARY:  The patient is moving air well with no rales, wheezes, or rhonchi.  No prolonged expiratory phase. ABDOMEN:  Soft.  No guarding or rebound.  He had positive bowel sounds. GENITALIA:  Deferred. EXTREMITIES:  Without clubbing, cyanosis, or edema.  No deformities were noted. NEURO:  The patient is awake.  He recognizes his examiner.  His speech is clear.  He responds to questions appropriately.  He moves all extremities to command.  He is noted to have no tremor.  FINAL LABORATORY:  From July 04, 2013, INR was 1.61.  Chemistries from July 02, 2013, with a sodium of 138, potassium 4, chloride 104, CO2 at 23, BUN at 59, creatinine 2.74, glucose was 119.  CBC from July 01, 2013, with a white count of 5800; hemoglobin was 8.8 g, which is just slightly below his baseline; platelet count 130,000.  TSH from June 19, 2013, was 1.58.  Last A1c, July 02, 2013, was 7.6%.  DISCHARGE MEDICATIONS: 1. Amlodipine 10 mg daily. 2. Aspirin 81 mg daily. 3. Atorvastatin 20 mg daily. 4. Carvedilol 6.25 mg b.i.d. 5. Furosemide 80 mg b.i.d. for 3 days, then reduce dose to 80 mg     daily. 6. Levemir 15 units at bedtime. 7. Potassium 20 mEq t.i.d. 8. Coumadin 5 mg daily except for Saturday and Sunday, when he should     take 2.5 mg.  The patient will need to have followup INR in 5 days.  DISPOSITION:  The patient to be transferred to skilled care.  He may follow all facility protocols.  May have leave of absence with family. The patient does have an appointment with Dr. Olga Millers on October 13 at 12:30 p.m. and needs to keep this appointment.  CODE STATUS:  The patient is full code.  The patient's condition at time of  discharge dictation is stable but guarded given his age and multiple comorbidities.     Rosalyn Gess Norins, MD     MEN/MEDQ  D:  07/05/2013  T:  07/05/2013  Job:  [id number]  cc:   Zetta Bills, MD Madolyn Frieze. [name], MD, [facility]

## 2013-07-05 NOTE — Progress Notes (Signed)
Pt in bed eating breakfast. O to place n self. No complaints of pain or sob. Pt currently has no IV per night Rn md is aware. Rn will place IV if pt not d.c today. Will continue to monitor PT

## 2013-07-05 NOTE — Progress Notes (Signed)
Subjective: Easily awakened. Has no c/o. Denies C/P, denies SOB  Objective: Lab: No results found for this basename: WBC, NEUTROABS, HGB, HCT, MCV, PLT,  in the last 72 hours No results found for this basename: NA, K, CL, CO3, GLUCOSE, BUN, CREATININE, CALCIUM, MG, PHOS,  in the last 72 hours  Imaging:  Scheduled Meds: . amLODipine  10 mg Oral Daily  . antiseptic oral rinse  15 mL Mouth Rinse BID  . aspirin EC  81 mg Oral Daily  . atorvastatin  20 mg Oral QHS  . carvedilol  6.25 mg Oral BID  . insulin aspart  0-9 Units Subcutaneous TID WC  . insulin detemir  15 Units Subcutaneous QHS  . Warfarin - Pharmacist Dosing Inpatient   Does not apply q1800   Continuous Infusions:  PRN Meds:.hydrALAZINE   Physical Exam: Filed Vitals:   07/05/13 0638  BP: 145/74  Pulse: 60  Temp: 98.4 F (36.9 C)  Resp: 20   See d/c summary     Assessment/Plan: Ready for transfer to SNF. Priority dictation # [phone]   Illene Regulus [place] [facility] ([phone]; (c) [phone] Call-grp - [name]  Tele: [phone]  07/05/2013, 6:45 AM

## 2013-07-05 NOTE — Progress Notes (Signed)
ANTICOAGULATION CONSULT NOTE - Follow Up Consult  Pharmacy Consult for warfarin Indication: AFlutter  No Known Allergies  Patient Measurements: Height: 5\' 11"  (180.3 cm) Weight: 171 lb (77.565 kg) (bed scale) IBW/kg (Calculated) : 75.3   Vital Signs: Temp: 98.4 F (36.9 C) (10/09 0638) Temp src: Oral (10/09 0638) BP: 156/54 mmHg (10/09 1015) Pulse Rate: 62 (10/09 1015)  Labs:  Recent Labs  07/03/13 0725 07/04/13 0650 07/05/13 0635  HGB  --   --  8.9*  HCT  --   --  26.3*  PLT  --   --  142*  LABPROT 18.0* 18.7* 19.4*  INR 1.53* 1.61* 1.69*  CREATININE  --   --  2.37*    Estimated Creatinine Clearance: 26.5 ml/min (by C-G formula based on Cr of 2.37).   Assessment: 20 YOM who continues on warfarin for AFlutter. His home dose was 5mg  daily except 2.5mg  on Sundays and Saturdays. Warfarin was resumed 10/3, but dose was held 10/4 as INR made a large jump. Now INR is slow to increase- value is 1.69 today.  No overt bleeding noted.  CBC stable  Goal of Therapy:  INR 2-3 Monitor platelets by anticoagulation protocol: Yes    Plan:  1. Warfarin 7.5mg  po x1 tonight in case patient is still here 2. Daily PT/INR  Celedonio Miyamoto, PharmD, BCPS Clinical Pharmacist Pager [phone]   07/05/2013 2:52 PM

## 2013-07-05 NOTE — Progress Notes (Signed)
  Echocardiogram 2D Echocardiogram has been performed.  Jorje Guild 07/05/2013, 9:32 AM

## 2013-07-05 NOTE — Progress Notes (Signed)
Per Dr. Debby Bud, patient is medically stable for d/c today. Bed is available at [facility]; patient is agreeable to d/c and family notified. Daughter signed admission papers at facility.  CSW has been working throughout the day to secure the authorization from [place] [facility] [employer].  Clinicals were sent to [facility] yesterday and she requested additional clinicals today which were sent this a.m.  Continue to await authorization for d/c to SNF.  Discussed with RNCM Ivonne Andrew and Dr. Lorain Childes.  Unable to send patient without insurance authorization.  Lorri Frederick. [place]  [phone]

## 2013-07-05 NOTE — Progress Notes (Signed)
Occupational Therapy Treatment Patient Details Name: [name] MRN: [id number] DOB: 1931/08/14 Today's Date: 07/05/2013 Time: 4098-1191 OT Time Calculation (min): 23 min  OT Assessment / Plan / Recommendation  History of present illness Pt admitted after being found down with hypoglycemia and facial swelling.  Pt was intubated for airway protection.   OT comments  Pt awakened for therapy.  More appropriate today, but continues to have a poor memory.  Agreeable only to EOB grooming activities, but did stand to urinate and move to [facility].  Declined up in chair.  Follow Up Recommendations  SNF    Barriers to Discharge       Equipment Recommendations  None recommended by OT    Recommendations for Other Services    Frequency Min 2X/week   Progress towards OT Goals Progress towards OT goals: Progressing toward goals  Plan Discharge plan remains appropriate    Precautions / Restrictions Precautions Precautions: Fall Restrictions Weight Bearing Restrictions: No   Pertinent Vitals/Pain VSS, no pain reported    ADL  Grooming: Wash/dry hands;Wash/dry face;Teeth care;Set up Where Assessed - Grooming: Unsupported sitting Toilet Transfer Method: Sit to Barista:  (stood to use urinal) Toileting - Architect and Hygiene: Minimal assistance Where Assessed - Engineer, mining and Hygiene: Standing Equipment Used: Gait belt Transfers/Ambulation Related to ADLs: stood to use urinal and took 2 steps to [facility], declined getting to chair ADL Comments: Pt sat EOB with set up to perform grooming.    OT Diagnosis:    OT Problem List:   OT Treatment Interventions:     OT Goals(current goals can now be found in the care plan section)    Visit Information  Last OT Received On: 07/05/13 Assistance Needed: +1 History of Present Illness: Pt admitted after being found down with hypoglycemia and facial swelling.  Pt was intubated for airway  protection.    Subjective Data      Prior Functioning       Cognition  Cognition Arousal/Alertness: Awake/alert (slow to awaken) Behavior During Therapy: WFL for tasks assessed/performed Overall Cognitive Status: Impaired/Different from baseline Area of Impairment: Safety/judgement;Awareness;Memory;Orientation Orientation Level: Situation;Time Memory: Decreased short-term memory Safety/Judgement: Decreased awareness of safety Awareness: Intellectual    Mobility  Bed Mobility Bed Mobility: Supine to Sit;Sit to Supine Supine to Sit: 5: Supervision;HOB elevated;With rails Sit to Supine: 5: Supervision;With rail;HOB elevated Transfers Transfers: Sit to Stand;Stand to Sit Sit to Stand: 4: Min assist;With upper extremity assist;From bed Stand to Sit: 4: Min assist;With upper extremity assist;To bed    Exercises      Balance     End of Session OT - End of Session Patient left: in bed;with call bell/phone within reach;with bed alarm set  GO     Evern Bio 07/05/2013, 11:45 AM [phone]

## 2013-07-06 ENCOUNTER — Inpatient Hospital Stay (HOSPITAL_COMMUNITY): Payer: Medicare Other

## 2013-07-06 DIAGNOSIS — D649 Anemia, unspecified: Secondary | ICD-10-CM

## 2013-07-06 DIAGNOSIS — I10 Essential (primary) hypertension: Secondary | ICD-10-CM

## 2013-07-06 DIAGNOSIS — Z7901 Long term (current) use of anticoagulants: Secondary | ICD-10-CM

## 2013-07-06 LAB — CULTURE, BLOOD (ROUTINE X 2)
Culture: NO GROWTH
Culture: NO GROWTH

## 2013-07-06 LAB — GLUCOSE, CAPILLARY
Glucose-Capillary: 66 mg/dL — ABNORMAL LOW (ref 70–99)
Glucose-Capillary: 102 mg/dL — ABNORMAL HIGH (ref 70–99)

## 2013-07-06 LAB — PROTIME-INR: Prothrombin Time: 19 seconds — ABNORMAL HIGH (ref 11.6–15.2)

## 2013-07-06 MED ORDER — WARFARIN SODIUM 7.5 MG PO TABS
7.5000 mg | ORAL_TABLET | Freq: Once | ORAL | Status: DC
  Filled 2013-07-06: qty 1

## 2013-07-06 NOTE — Progress Notes (Signed)
Subjective: [name] is sitting up in bed eating his breakfast with a standby to couch safe swallow. He is in no distress. Notes reviewed - a CBG recorded of 62 earlier which responded to oral glycemic beverage. He is in no distress  Objective: Lab:  Recent Labs  07/05/13 0635  WBC 4.7  HGB 8.9*  HCT 26.3*  MCV 87.4  PLT 142*    Recent Labs  07/05/13 0635  NA 139  K 4.1  CL 108  GLUCOSE 83  BUN 48*  CREATININE 2.37*  CALCIUM 9.0    Imaging:  Scheduled Meds: . amLODipine  10 mg Oral Daily  . antiseptic oral rinse  15 mL Mouth Rinse BID  . aspirin EC  81 mg Oral Daily  . atorvastatin  20 mg Oral QHS  . carvedilol  6.25 mg Oral BID  . insulin aspart  0-9 Units Subcutaneous TID WC  . insulin detemir  15 Units Subcutaneous QHS  . Warfarin - Pharmacist Dosing Inpatient   Does not apply q1800   Continuous Infusions:  PRN Meds:.hydrALAZINE   Physical Exam: Filed Vitals:   07/06/13 0545  BP: 147/84  Pulse: 70  Temp: 99.1 F (37.3 C)  Resp: 20   Wt Readings from Last 3 Encounters:  07/06/13 199 lb 6.4 oz (90.447 kg)  06/19/13 203 lb (92.08 kg)  01/04/13 203 lb 6.4 oz (92.262 kg)   Total I/O this adm: +2173; diuresed 1875 cc last 48 hrs.  Gen'l - elderly AA man in no distress HEENT- C&S muddy PERRLA Cor - 2+ radial Pulm - normal respirations with no increased WOB Neuro - awake, alert     Assessment/Plan: 1. Pulm - stable with no increased WOB  2. DM - on reduced basal insulin. CBGs generally controlled  3. Neuro - no change. Does continue to require supervision with meals to insure safe swallow.   4. CKD IV - stable  5. Cardiac - hemodynamically stable  6. Hypertension - stable  Dispo - the patient remains stable for transfer to SNF for rehab and care. Remote off-site authority deemed him unstable for discharge for unknown reasons. Will order stat PA-LAT CXR on the basis of clerical recommendation. D/C is done.   Illene Regulus [place] [facility] (o)  [phone]; (c) [phone] Call-grp - [name]  Tele: [phone]  07/06/2013, 8:09 AM

## 2013-07-06 NOTE — Progress Notes (Signed)
Inpatient Diabetes Program Recommendations  AACE/ADA: New Consensus Statement on Inpatient Glycemic Control (2013)  Target Ranges:  Prepandial:   less than 140 mg/dL      Peak postprandial:   less than 180 mg/dL (1-2 hours)      Critically ill patients:  140 - 180 mg/dL   Results for JASKARN, SCHWEER (MRN [id number]) as of 07/06/2013 11:36  Ref. Range 07/06/2013 05:52  Glucose-Capillary Latest Range: 70-99 mg/dL 66 (L)   HYPOglycemia this morning.  Recommend decreasing Levemir dose. Thank you  Piedad Climes BSN, RN,CDE Inpatient Diabetes Coordinator [phone] (team pager)

## 2013-07-06 NOTE — Progress Notes (Signed)
Patients blood sugar 66.  Gave patient a cup of orange juice and a few wafer cookies.  Will continue to monitor.

## 2013-07-06 NOTE — Progress Notes (Signed)
F/u CXR IMPRESSION:  Pulmonary edema, likely from congestive heart failure, is without  significant change from the prior study. Probable small bilateral  pleural effusions.  Patient is clinically stable for transfer to SNF. He will need to follow d/c orders for lasix. He will need to keep his appointment with Dr. [name] Oct 13th - transportation will need to be provided.  Addendum d/c dictation # [id number]

## 2013-07-06 NOTE — Progress Notes (Signed)
Patient is being transported to Radiology for xray. D.Shakeeta Godette RN

## 2013-07-06 NOTE — Progress Notes (Signed)
Patient's IV has been discontinued, the patient is being discharged and discharge instructions has been sent with EMS to facility.  The patient is being transported to [facility] via EMS. Report given to nurse at facility. D.Phillippa Straub RN

## 2013-07-06 NOTE — Progress Notes (Signed)
Pt weight yesterday 77.56kg. Pt weight today on standing scale 90.44 kg, pt re-weighed x2. Pt weighed in bed this AM as well with weight 80.4 kg. Pt in no apparent distress. Will continue to monitor. Baron Hamper, RN

## 2013-07-06 NOTE — Progress Notes (Signed)
Reassessed patients blood sugar of 102.

## 2013-07-06 NOTE — Discharge Summary (Signed)
NAME:  [name], [name] NO.:  [id number]  MEDICAL RECORD NO.:  [id number]  LOCATION:  4E13C                        FACILITY:  [facility]  PHYSICIAN:  Rosalyn Gess. Makena Murdock, MD  DATE OF BIRTH:  1930-11-24  DATE OF ADMISSION:  06/28/2013 DATE OF DISCHARGE:                              DISCHARGE SUMMARY   ADDENDUM:  [name] has remained medically stable over the past 24 hours.  A repeat chest x-ray was performed on today date of discharge which showed the patient had persistent pulmonary edema without change from previous study.  The patient has successfully diuresed 1875 mL. Oxygen saturations are normal.  Respiration rate is mildly elevated. The patient was sitting up eating breakfast in no distress.  No increased work of breathing.  All discharge orders and plans remain the same.  It is imperative that the patient does follow the Lasix orders as written.  It is critically important that the patient keep his followup appointment with Dr. Olga Millers from Cardiology on July 09, 2013 and transportation will need to be arranged for him.  The patient does remain stable for discharge and may be transferred to skilled care facility when arrangements can be finalized.     Rosalyn Gess Keiland Pickering, MD     MEN/MEDQ  D:  07/06/2013  T:  07/06/2013  Job:  [id number]

## 2013-07-06 NOTE — Progress Notes (Signed)
ANTICOAGULATION CONSULT NOTE   Pharmacy Consult for warfarin Indication: AFlutter  No Known Allergies  Patient Measurements: Height: 5\' 11"  (180.3 cm) Weight: 199 lb 6.4 oz (90.447 kg) (Scale C/ Reported OT RN) IBW/kg (Calculated) : 75.3   Vital Signs: Temp: 97.4 F (36.3 C) (10/10 0900) Temp src: Axillary (10/10 0900) BP: 147/84 mmHg (10/10 0545) Pulse Rate: 72 (10/10 0900)  Labs:  Recent Labs  07/04/13 0650 07/05/13 0635 07/06/13 0530  HGB  --  8.9*  --   HCT  --  26.3*  --   PLT  --  142*  --   LABPROT 18.7* 19.4* 19.0*  INR 1.61* 1.69* 1.64*  CREATININE  --  2.37*  --     Estimated Creatinine Clearance: 28.6 ml/min (by C-G formula based on Cr of 2.37).   Assessment: 43 YOM who continues on warfarin for AFlutter. His home dose was 5mg  daily except 2.5mg  on Sundays and Saturdays. Warfarin was resumed 10/3, but dose was held 10/4 as INR made a large jump. Now INR is slow to increase and down this am?- value is 1.64 today.  No overt bleeding noted.  CBC stable  Goal of Therapy:  INR 2-3 Monitor platelets by anticoagulation protocol: Yes    Plan:  1. Warfarin 7.5mg  po x1  2. Daily PT/INR  Sheppard Coil PharmD., BCPS Clinical Pharmacist Pager [phone] 07/06/2013 9:59 AM

## 2013-07-08 NOTE — Clinical Social Work Placement (Addendum)
    Clinical Social Work Department CLINICAL SOCIAL WORK PLACEMENT NOTE 07/08/2013  Patient:  [name], [name]  Account Number:  [id number] Admit date:  06/28/2013  Clinical Social Worker:  Lupita Leash Arfa Lamarca, LCSWA  Date/time:  07/04/2013 12:40 PM  Clinical Social Work is seeking post-discharge placement for this patient at the following level of care:   SKILLED NURSING   (*CSW will update this form in Epic as items are completed)   07/04/2013  Patient/family provided with [facility] Department of Clinical Social Work's list of facilities offering this level of care within the geographic area requested by the patient (or if unable, by the patient's family).  07/04/2013  Patient/family informed of their freedom to choose among providers that offer the needed level of care, that participate in Medicare, Medicaid or managed care program needed by the patient, have an available bed and are willing to accept the patient.  07/04/2013  Patient/family informed of [facility]' ownership interest in [name], as well as of the fact that they are under no obligation to receive care at this facility.  PASARR submitted to EDS on 07/04/2013 PASARR number received from EDS on 07/04/2013  FL2 transmitted to all facilities in geographic area requested by pt/family on  07/04/2013 FL2 transmitted to all facilities within larger geographic area on   Patient informed that his/her managed care company has contracts with or will negotiate with  certain facilities, including the following:   Joette Catching- Steward Drone- RNCM   Clinicals sent to her 07/04/13     Patient/family informed of bed offers received:  07/04/2013 Patient chooses bed at [name] Physician recommends and patient chooses bed at    Patient to be transferred to [facility] on  07/06/2013 Patient to be transferred to facility by Ambulance  [name]  The following physician request were entered  in Epic:   Additional Comments: 07/06/13 DC to SNF today. DC was delayed yesterday as CSW did not have authorization from [name].  Auth was obtained today and ok per MD, patient, daughterSuzette Battiest for d/c today. Nursing notified and called report. No further CSW needs identified. CSW signing off.  Lorri Frederick. Shaelynn Dragos, LCSWA  [phone]

## 2013-07-08 NOTE — Clinical Social Work Psychosocial (Addendum)
    Clinical Social Work Department BRIEF PSYCHOSOCIAL ASSESSMENT 07/08/2013  Patient:  [name], [name]     Account Number:  [id number]     Admit date:  06/28/2013  Clinical Social Worker:  Tiburcio Pea  Date/Time:  07/04/2013 12:00 N  Referred by:  Physician  Date Referred:  07/03/2013 Referred for  SNF Placement   Other Referral:   Interview type:  Other - See comment Other interview type:   Pateint and son Morton Stall.    PSYCHOSOCIAL DATA Living Status:  ALONE Admitted from facility:   Level of care:   Primary support name:  Stevie Charter  [phone] Primary support relationship to patient:  CHILD, ADULT Degree of support available:   Strong support    Daughter- Suzette Battiest-- "Puddin"- [phone]    CURRENT CONCERNS Current Concerns  Post-Acute Placement   Other Concerns:    SOCIAL WORK ASSESSMENT / PLAN 79 year old male who lives alone- referred to CSW for short term SNF placement. CSW met with patient yesterday- he is alert and oriented to person but did appear to be somewhat disoriented. He requested that CSW speak with his son Homero Fellers or daughter Suzette Battiest to make final plans. Patient related that he wants to return home if possible.  PT is recommending short term SNF. CSW was able to speak with son Homero Fellers and he will agree to STR as long as his father agrees.  He states that the family cannot provide 24 hour care at home. Discussed bed search process; family prefers [facility] if possible  Bed search initiated. Fl2 placed on chart for MD's signature.   Assessment/plan status:  Psychosocial Support/Ongoing Assessment of Needs Other assessment/ plan:   Information/referral to community resources:   SNF bed list provided to patient and reviewd with son- Morton Stall.    PATIENT'S/FAMILY'S RESPONSE TO PLAN OF CARE: Patient is alert but slightly confused at times. He defers to his son Homero Fellers. Family agrees to short term rehab as long as patient agrees. He does agree but still  wants to go home as soon as possible.  [facility] is facility of choice- Marylene Land- Admissions Diretor stated that they would accept patient and will initiate authorization request. CSW will send auth request to [facility] [place].  Lorri Frederick. Zsazsa Bahena, LCSWA  [phone]

## 2013-07-09 ENCOUNTER — Encounter (HOSPITAL_COMMUNITY): Payer: Medicare Other

## 2013-07-09 ENCOUNTER — Encounter: Payer: Medicare Other | Admitting: Cardiology

## 2013-07-09 NOTE — Progress Notes (Signed)
HPI: Pleasant male for fu of pulmonary HTN, CHF and CAD. Pt is s/p CABG in 2006. Carotid Dopplers 12/13: 0-39% RICA, 60-70% LICA; fu in 6 months. [facility] 3/13: occluded LAD, mid and dist LAD filled from the SVG, apical LAD 99% (unchanged), dCFX occluded prior to OM2, OM2 filled via the SVG, RCA prox 25%, mid 40% and distal 25%, SVG to Dx was occluded. Medical therapy recommended.  Patient diagnosed with pulmonary hypertension 10/13; V/Q scan was negative for pulmonary embolism. ANA and HIV were both negative. Bubble study was negative for obvious shunt. RHC due to his significant pulmonary hypertension: PA 60/23, mean 38, PCWP 16, CO 8.75. Hemodynamics did not appear to be consistent with pericardial constriction. High cardiac output was thought to be inaccurate. Etiology of his pulmonary hypertension was unclear. Trial of sildenafil was recommended. Outpatient sleep study recommended. He had SPEP/UPEP with elevated Kappa/Lambda free light chain ratio. He was seen by hematology and ultimately diagnosed with MGUS. Beta blocker was d/c'd 2/2 bradycardia/AV block. PFTs revealed restrictive lung disease. Patient seen by Tereso Newcomer in September of 2014 with complaints of fatigue. He was found to be in new onset atrial flutter. He was placed on Coumadin. Admitted in October with altered mental status felt secondary to hypoglycemia.   Current Outpatient Prescriptions  Medication Sig Dispense Refill  . amLODipine (NORVASC) 10 MG tablet Take 10 mg by mouth daily.      Marland Kitchen aspirin EC 81 MG tablet Take 1 tablet (81 mg total) by mouth daily.  90 tablet  3  . atorvastatin (LIPITOR) 20 MG tablet Take 1 tablet (20 mg total) by mouth at bedtime.  30 tablet  3  . carvedilol (COREG) 6.25 MG tablet Take 6.25 mg by mouth 2 (two) times daily.      . furosemide (LASIX) 80 MG tablet Take 3 tablets (240 mg total) by mouth 2 (two) times daily. After 3 days reduce dose to 80 mg daily      . insulin detemir (LEVEMIR) 100  UNIT/ML injection Inject 0.15 mLs (15 Units total) into the skin at bedtime.  10 mL  2  . potassium chloride SA (K-DUR,KLOR-CON) 20 MEQ tablet Take 1 tablet (20 mEq total) by mouth 3 (three) times daily.  90 tablet  3  . warfarin (COUMADIN) 5 MG tablet Take 1 tablet (5 mg total) by mouth daily. Take 5mg  Daily except.Saturday and Sunday take 2.5mg   30 tablet  2   No current facility-administered medications for this visit.     Past Medical History  Diagnosis Date  . CAD (coronary artery disease)     s/p 3v CABG 2006, myoview 04/2010 EF 49%, prior inferior/apical infarct, no ischemia, LHC 11/2011 stable anatomy (occluded LAD filled from vein graft, distal LCx occluded prior to OM2, OM2 filled from vein graft, RCA prox 25%, mid 40%, distal 25% lesions, SVG to diagonal occluded) Med Rx  . Chronic diastolic heart failure     hx of cardiorenal syndrome;  Echo 06/2012 EF 50-55%, mod diastolic dysfunction, mild MR, mod TR, mild R/LAE, mild RV dilatation, PASP .  . Carotid stenosis     a. Carotid dopplers R 40-59%, Left 60-79%;   b. carotids 12/13:  0-39% RICA, 60-79% LICA (rpt in 6 mos)  . HLD (hyperlipidemia)   . Mediastinal adenopathy     per CT chest in 2006 with PET scan showing very limited metabolic activity; not felt to have a significant neoplastic potential  . Iron deficiency  anemia   . Hypertension   . Diabetes mellitus     insulin dependent   . GERD (gastroesophageal reflux disease)   . AV block, Mobitz 1     Noted 11/2011 in hospital, BB stopped  . Colon polyps   . Diverticulosis   . Pulmonary arterial hypertension     a.  RHC 11/1 PASP 60 mmHg (mean 38); b. RHC 11/15 PA pressure 66/24, PCWP 26 and CO 6.2 => Sildenafil started;   c. Echo bubble study 11/13:  no obvious shunt;   d. VQ scan neg for pulmonary embolism  . CKD (chronic kidney disease) stage 5, GFR less than 15 ml/min     hx of cardiorenal syndrome;  Dr Allena Katz;  AVF recommended and scheduled (patient hesitant to  proceed);   Renal US 11/13: diff echogenic kidneys c/w medical renal disease; no hydronephrosis or renal mass  . Hyperparathyroidism, secondary renal   . MGUS (monoclonal gammopathy of unknown significance)     a. Kappa/Lambda free light chain ratio 11/13:  23.99;  b. Met. Bone survey 11/13:  no osteolytic lesions to suggest MMyeloma  . Atrial flutter     diagnosed 05/2013; Coumadin initiated    Past Surgical History  Procedure Laterality Date  . Appendectomy    . Coronary artery bypass graft  2006    SVG to OM2, SVG to LAD, SVG to DX; (the LIMA did not have good flow and therefore was not used)  . Cardiac catheterization  12/21/2011    History   Social History  . Marital Status: Widowed    Spouse Name: N/A    Number of Children: 12  . Years of Education: N/A   Occupational History  . Retired    Social History Main Topics  . Smoking status: Former Smoker    Quit date: 09/27/1953  . Smokeless tobacco: Never Used  . Alcohol Use: No  . Drug Use: No  . Sexual Activity: No   Other Topics Concern  . Not on file   Social History Narrative   10th grade. Married '64-10 yrs/widowed; married '77 - 46yrs/widowed. 7 sons, 5 daughters; 30+ grandchildren, 15 great-grands. 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 His son checks on him all the time.     ROS: no fevers or chills, productive cough, hemoptysis, dysphasia, odynophagia, melena, hematochezia, dysuria, hematuria, rash, seizure activity, orthopnea, PND, pedal edema, claudication. Remaining systems are negative.  Physical Exam: Well-developed well-nourished in no acute distress.  Skin is warm and dry.  HEENT is normal.  Neck is supple.  Chest is clear to auscultation with normal expansion.  Cardiovascular exam is regular rate and rhythm.  Abdominal exam nontender or distended. No masses palpated. Extremities show no edema. neuro grossly intact  ECG     This encounter  was created in error - please disregard.

## 2013-07-10 ENCOUNTER — Encounter (HOSPITAL_COMMUNITY): Payer: Medicare Other

## 2013-07-12 ENCOUNTER — Non-Acute Institutional Stay (SKILLED_NURSING_FACILITY): Payer: Medicare Other | Admitting: Internal Medicine

## 2013-07-12 DIAGNOSIS — E1129 Type 2 diabetes mellitus with other diabetic kidney complication: Secondary | ICD-10-CM

## 2013-07-12 DIAGNOSIS — I509 Heart failure, unspecified: Secondary | ICD-10-CM

## 2013-07-12 DIAGNOSIS — I150 Renovascular hypertension: Secondary | ICD-10-CM

## 2013-07-12 DIAGNOSIS — I2510 Atherosclerotic heart disease of native coronary artery without angina pectoris: Secondary | ICD-10-CM

## 2013-07-19 ENCOUNTER — Non-Acute Institutional Stay (SKILLED_NURSING_FACILITY): Payer: Medicare Other | Admitting: Internal Medicine

## 2013-07-19 DIAGNOSIS — D631 Anemia in chronic kidney disease: Secondary | ICD-10-CM

## 2013-07-19 DIAGNOSIS — N184 Chronic kidney disease, stage 4 (severe): Secondary | ICD-10-CM

## 2013-07-24 ENCOUNTER — Non-Acute Institutional Stay (SKILLED_NURSING_FACILITY): Payer: Medicare Other | Admitting: Internal Medicine

## 2013-07-24 DIAGNOSIS — E876 Hypokalemia: Secondary | ICD-10-CM

## 2013-07-24 DIAGNOSIS — I2510 Atherosclerotic heart disease of native coronary artery without angina pectoris: Secondary | ICD-10-CM

## 2013-07-24 DIAGNOSIS — E1059 Type 1 diabetes mellitus with other circulatory complications: Secondary | ICD-10-CM

## 2013-07-24 DIAGNOSIS — I10 Essential (primary) hypertension: Secondary | ICD-10-CM

## 2013-07-25 DIAGNOSIS — E876 Hypokalemia: Secondary | ICD-10-CM | POA: Insufficient documentation

## 2013-07-25 NOTE — Progress Notes (Signed)
PROGRESS NOTE  DATE: 07/24/2013   FACILITY: [facility]  LEVEL OF CARE: SNF (31)  Discharge Visit  CHIEF COMPLAINT:  Manage diabetes mellitus and CAD  HISTORY OF PRESENT ILLNESS: I was requested by the social worker to perform face-to-face evaluation for discharge:  Patient was admitted to this facility for short-term rehabilitation after the patient's recent hospitalization for acute encephalopathy.  Patient has completed SNF rehabilitation and therapy has cleared the patient for discharge on 07-26-13.  Reassessment of ongoing problem(s):  CAD: The angina has been stable. The patient denies dyspnea on exertion, orthopnea, pedal edema, palpitations and paroxysmal nocturnal dyspnea. No complications noted from the medication presently being used.  DM:pt's DM remains stable.  Pt denies polyuria, polydipsia, polyphagia, changes in vision or hypoglycemic episodes.  No complications noted from the medication presently being used.  Last hemoglobin A1c not available.  PAST MEDICAL HISTORY : Reviewed.  No changes.  CURRENT MEDICATIONS: Reviewed per [facility]  REVIEW OF SYSTEMS:  GENERAL: no change in appetite, no fatigue, no weight changes, no fever, chills or weakness RESPIRATORY: no cough, SOB, DOE, wheezing, hemoptysis CARDIAC: no chest pain, edema or palpitations GI: no abdominal pain, diarrhea, constipation, heart burn, nausea or vomiting  PHYSICAL EXAMINATION  GENERAL: no acute distress, normal body habitus EYES: conjunctivae normal, sclerae normal, normal eye lids NECK: supple, trachea midline, no neck masses, no thyroid tenderness, no thyromegaly LYMPHATICS: no LAN in the neck, no supraclavicular LAN RESPIRATORY: breathing is even & unlabored, BS CTAB CARDIAC: RRR, no murmur,no extra heart sounds, no edema GI: abdomen soft, normal BS, no masses, no tenderness, no hepatomegaly, no splenomegaly PSYCHIATRIC: the patient is alert & oriented to person, affect &  behavior appropriate  LABS/RADIOLOGY:  10-14 hemoglobin 9.5, MCV 89 otherwise CBC normal, bun 39, creatinine 2.54 otherwise BMP normal  ASSESSMENT/PLAN:  CAD-stable Diabetes mellitus with vascular complications-continue Levemir Hypertension-continue current medications Hypokalemia-well-controlled Hyperlipidemia-continue Lipitor  I have filled out patient's discharge paperwork and written prescriptions.  Patient will receive home health PT, OT, nursing and SW. DME provided: Walker and 3-in-1 commode (348.31, 251.2, 518.81, 728.87)  Total discharge time: Greater than 30 minutes Discharge time involved coordination of the discharge process with Child psychotherapist, nursing staff and therapy department. Medical justification for home health services/DME verified.  CPT CODE: 16109

## 2013-07-27 ENCOUNTER — Telehealth: Payer: Self-pay | Admitting: Cardiology

## 2013-07-27 NOTE — Telephone Encounter (Signed)
These orders should be obtained from his primary care. Olga Millers

## 2013-07-27 NOTE — Telephone Encounter (Signed)
New Problem:  Merlene Morse Physical therapist, is calling to get an order for  skilled nursing, occupational therapy, and social work... Please advise

## 2013-07-31 NOTE — Telephone Encounter (Signed)
Left message for danielle to contact pts PCP.

## 2013-08-01 ENCOUNTER — Telehealth: Payer: Self-pay | Admitting: *Deleted

## 2013-08-01 NOTE — Telephone Encounter (Signed)
Request indicates patient is home from SNF. These orders are OK.   [name] needs an OV soon - follow up after hospitalization and ST-SNF as well as a face-to-face encounter for home health care.

## 2013-08-01 NOTE — Telephone Encounter (Signed)
Left message on VM.

## 2013-08-01 NOTE — Telephone Encounter (Signed)
Danielle, physical therapist, called requesting verbal orders for PT for 4 weeks, Skilled Nursing and Social Work for initial evaluation.  Please advise

## 2013-08-06 ENCOUNTER — Ambulatory Visit (INDEPENDENT_AMBULATORY_CARE_PROVIDER_SITE_OTHER): Payer: Medicare Other | Admitting: Internal Medicine

## 2013-08-06 ENCOUNTER — Encounter: Payer: Self-pay | Admitting: Internal Medicine

## 2013-08-06 VITALS — BP 132/66 | HR 58 | Temp 99.0°F | Wt 191.0 lb

## 2013-08-06 DIAGNOSIS — E785 Hyperlipidemia, unspecified: Secondary | ICD-10-CM

## 2013-08-06 DIAGNOSIS — I2589 Other forms of chronic ischemic heart disease: Secondary | ICD-10-CM

## 2013-08-06 DIAGNOSIS — N185 Chronic kidney disease, stage 5: Secondary | ICD-10-CM

## 2013-08-06 DIAGNOSIS — Z8679 Personal history of other diseases of the circulatory system: Secondary | ICD-10-CM

## 2013-08-06 DIAGNOSIS — I10 Essential (primary) hypertension: Secondary | ICD-10-CM

## 2013-08-06 DIAGNOSIS — E119 Type 2 diabetes mellitus without complications: Secondary | ICD-10-CM

## 2013-08-06 NOTE — Progress Notes (Signed)
Pre visit review using our clinic review tool, if applicable. No additional management support is needed unless otherwise documented below in the visit note. 

## 2013-08-06 NOTE — Patient Instructions (Signed)
Good to see you today. Better than ever.  Your lab in the hospital revealed good control of the diabetes over the long term.  Please keep you appointment with Dr. [name] and see Dr. Allena Katz around December.  You can come in for lab work after the new year and make an appointment to see me the 1st week of January, after you have had lab by a day or two.  Have good holidays between now and when I see you next.

## 2013-08-06 NOTE — Progress Notes (Signed)
Subjective:    Patient ID: [name], male    DOB: 04/10/1931, 79 y.o.   MRN: [id number]  HPI [name] presents for a face to face encounter for home health PT and OT. He was discharged from [facility] oct 10th after profound hypoglycemia. He spent 14 days in SNF for rehab and was recommended for outpatient PT and home health nursing. He has difficulty managing his medications but has help during the day and his son is there at night. He still has some slowness and unsteadiness in his gait.  Past Medical History  Diagnosis Date  . CAD (coronary artery disease)     s/p 3v CABG 2006, myoview 04/2010 EF 49%, prior inferior/apical infarct, no ischemia, LHC 11/2011 stable anatomy (occluded LAD filled from vein graft, distal LCx occluded prior to OM2, OM2 filled from vein graft, RCA prox 25%, mid 40%, distal 25% lesions, SVG to diagonal occluded) Med Rx  . Chronic diastolic heart failure     hx of cardiorenal syndrome;  Echo 06/2012 EF 50-55%, mod diastolic dysfunction, mild MR, mod TR, mild R/LAE, mild RV dilatation, PASP .  . Carotid stenosis     a. Carotid dopplers R 40-59%, Left 60-79%;   b. carotids 12/13:  0-39% RICA, 60-79% LICA (rpt in 6 mos)  . HLD (hyperlipidemia)   . Mediastinal adenopathy     per CT chest in 2006 with PET scan showing very limited metabolic activity; not felt to have a significant neoplastic potential  . Iron deficiency anemia   . Hypertension   . Diabetes mellitus     insulin dependent   . GERD (gastroesophageal reflux disease)   . AV block, Mobitz 1     Noted 11/2011 in hospital, BB stopped  . Colon polyps   . Diverticulosis   . Pulmonary arterial hypertension     a.  RHC 11/1 PASP 60 mmHg (mean 38); b. RHC 11/15 PA pressure 66/24, PCWP 26 and CO 6.2 => Sildenafil started;   c. Echo bubble study 11/13:  no obvious shunt;   d. VQ scan neg for pulmonary embolism  . CKD (chronic kidney disease) stage 5, GFR less than 15 ml/min     hx of cardiorenal  syndrome;  Dr Allena Katz;  AVF recommended and scheduled (patient hesitant to proceed);   Renal US 11/13: diff echogenic kidneys c/w medical renal disease; no hydronephrosis or renal mass  . Hyperparathyroidism, secondary renal   . MGUS (monoclonal gammopathy of unknown significance)     a. Kappa/Lambda free light chain ratio 11/13:  23.99;  b. Met. Bone survey 11/13:  no osteolytic lesions to suggest MMyeloma  . Atrial flutter     diagnosed 05/2013; Coumadin initiated   Past Surgical History  Procedure Laterality Date  . Appendectomy    . Coronary artery bypass graft  2006    SVG to OM2, SVG to LAD, SVG to DX; (the LIMA did not have good flow and therefore was not used)  . Cardiac catheterization  12/21/2011   Family History  Problem Relation Age of Onset  . Stomach cancer Sister   . Pancreatic cancer Brother   . Lung cancer Brother   . Colon cancer Neg Hx    History   Social History  . Marital Status: Widowed    Spouse Name: N/A    Number of Children: 12  . Years of Education: N/A   Occupational History  . Retired    Social History Main Topics  . Smoking status:  Former Smoker    Quit date: 09/27/1953  . Smokeless tobacco: Never Used  . Alcohol Use: No  . Drug Use: No  . Sexual Activity: No   Other Topics Concern  . Not on file   Social History Narrative   10th grade. Married '64-10 yrs/widowed; married '77 - 86yrs/widowed. 7 sons, 5 daughters; 30+ grandchildren, 15 great-grands. 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 His son checks on him all the time.     Current Outpatient Prescriptions on File Prior to Visit  Medication Sig Dispense Refill  . amLODipine (NORVASC) 10 MG tablet Take 10 mg by mouth daily.      Marland Kitchen aspirin EC 81 MG tablet Take 1 tablet (81 mg total) by mouth daily.  90 tablet  3  . atorvastatin (LIPITOR) 20 MG tablet Take 1 tablet (20 mg total) by mouth at bedtime.  30 tablet  3  . carvedilol (COREG) 6.25  MG tablet Take 6.25 mg by mouth 2 (two) times daily.      . furosemide (LASIX) 80 MG tablet Take 3 tablets (240 mg total) by mouth 2 (two) times daily. After 3 days reduce dose to 80 mg daily      . insulin detemir (LEVEMIR) 100 UNIT/ML injection Inject 0.15 mLs (15 Units total) into the skin at bedtime.  10 mL  2  . potassium chloride SA (K-DUR,KLOR-CON) 20 MEQ tablet Take 1 tablet (20 mEq total) by mouth 3 (three) times daily.  90 tablet  3  . warfarin (COUMADIN) 5 MG tablet Take 1 tablet (5 mg total) by mouth daily. Take 5mg  Daily except.Saturday and Sunday take 2.5mg   30 tablet  2   No current facility-administered medications on file prior to visit.       Review of Systems Constitutional:  Negative for fever, chills, activity change and unexpected weight change.  HEENT:  Negative for hearing loss, ear pain, congestion, neck stiffness and postnasal drip. Negative for sore throat or swallowing problems. Negative for dental complaints.   Eyes: Negative for vision loss or change in visual acuity.  Respiratory: Negative for chest tightness and wheezing. Negative for DOE.   Cardiovascular: Negative for chest pain or palpitations. No decreased exercise tolerance Gastrointestinal: No change in bowel habit. No bloating or gas. No reflux or indigestion Genitourinary: Negative for urgency, frequency, flank pain and difficulty urinating.  Musculoskeletal: Negative for myalgias, back pain, arthralgias and gait problem.  Neurological: Negative for dizziness, tremors, weakness and headaches.  Hematological: Negative for adenopathy.  Psychiatric/Behavioral: Negative for behavioral problems and dysphoric mood.       Objective:   Physical Exam Filed Vitals:   08/06/13 1441  BP: 132/66  Pulse: 58  Temp: 99 F (37.2 C)   gen'l- elderly man who has had significant weight loss with temporal wasting and interosseous wasting  HEENT- poor dentition, arcus senilis, PERRLA Neck - supple Cor - 2+  radial, slow sinus rhythm vs very well controlled a. Fib. Pulm - CTAP Neuro - very poor short term memory, some denial about his medical condition, ambulates with a cane but w/o assistance.      Assessment & Plan:  For multiple medical problems and acute recent illness [name] medically requires PT/OT for rehab at home and RN supervision to insure adherence to complicated medical regimen for a patient with mild to moderate dementia.

## 2013-08-07 NOTE — Progress Notes (Signed)
Patient ID: [name], male   DOB: 06/04/1931, 79 y.o.   MRN: [id number]        HISTORY & PHYSICAL  DATE: 07/12/2013   FACILITY: [facility]  LEVEL OF CARE: SNF (31)  ALLERGIES:  No Known Allergies  CHIEF COMPLAINT:  Manage CHF, diabetes mellitus, and renovascular hypertension.    HISTORY OF PRESENT ILLNESS:  The patient is an 79 year-old, African-American male who was hospitalized for profound hypoglycemia.  After hospitalization, he is admitted to this facility for short-term rehabilitation.   He has the following problems:    CHF:The patient does not relate significant weight changes, denies sob, DOE, orthopnea, PNDs, pedal edema, palpitations or chest pain.  CHF remains stable.  No complications form the medications being used.  2D-echo in October 2013 showed EF of 50-55%, akinesis of posterior myocardium, grade 2 diastolic dysfunction.    DM:pt's DM is unstable.  Pt denies polyuria, polydipsia, polyphagia, changes in vision or hypoglycemic episodes.  No complications noted from the medication presently being used.  Last hemoglobin A1c is:  7.6 in 06/2013.    Insulin was adjusted.    HTN: Pt 's HTN remains stable.  Denies CP, sob, DOE, pedal edema, headaches, dizziness or visual disturbances.  No complications from the medications currently being used.  Last BP :  140/62.    PAST MEDICAL HISTORY :  Past Medical History  Diagnosis Date  . CAD (coronary artery disease)     s/p 3v CABG 2006, myoview 04/2010 EF 49%, prior inferior/apical infarct, no ischemia, LHC 11/2011 stable anatomy (occluded LAD filled from vein graft, distal LCx occluded prior to OM2, OM2 filled from vein graft, RCA prox 25%, mid 40%, distal 25% lesions, SVG to diagonal occluded) Med Rx  . Chronic diastolic heart failure     hx of cardiorenal syndrome;  Echo 06/2012 EF 50-55%, mod diastolic dysfunction, mild MR, mod TR, mild R/LAE, mild RV dilatation, PASP .  . Carotid stenosis     a.  Carotid dopplers R 40-59%, Left 60-79%;   b. carotids 12/13:  0-39% RICA, 60-79% LICA (rpt in 6 mos)  . HLD (hyperlipidemia)   . Mediastinal adenopathy     per CT chest in 2006 with PET scan showing very limited metabolic activity; not felt to have a significant neoplastic potential  . Iron deficiency anemia   . Hypertension   . Diabetes mellitus     insulin dependent   . GERD (gastroesophageal reflux disease)   . AV block, Mobitz 1     Noted 11/2011 in hospital, BB stopped  . Colon polyps   . Diverticulosis   . Pulmonary arterial hypertension     a.  RHC 11/1 PASP 60 mmHg (mean 38); b. RHC 11/15 PA pressure 66/24, PCWP 26 and CO 6.2 => Sildenafil started;   c. Echo bubble study 11/13:  no obvious shunt;   d. VQ scan neg for pulmonary embolism  . CKD (chronic kidney disease) stage 5, GFR less than 15 ml/min     hx of cardiorenal syndrome;  Dr Allena Katz;  AVF recommended and scheduled (patient hesitant to proceed);   Renal US 11/13: diff echogenic kidneys c/w medical renal disease; no hydronephrosis or renal mass  . Hyperparathyroidism, secondary renal   . MGUS (monoclonal gammopathy of unknown significance)     a. Kappa/Lambda free light chain ratio 11/13:  23.99;  b. Met. Bone survey 11/13:  no osteolytic lesions to suggest MMyeloma  . Atrial flutter  diagnosed 05/2013; Coumadin initiated    PAST SURGICAL HISTORY: Past Surgical History  Procedure Laterality Date  . Appendectomy    . Coronary artery bypass graft  2006    SVG to OM2, SVG to LAD, SVG to DX; (the LIMA did not have good flow and therefore was not used)  . Cardiac catheterization  12/21/2011    SOCIAL HISTORY:  reports that he quit smoking about 59 years ago. He has never used smokeless tobacco. He reports that he does not drink alcohol or use illicit drugs.  FAMILY HISTORY:  Family History  Problem Relation Age of Onset  . Stomach cancer Sister   . Pancreatic cancer Brother   . Lung cancer Brother   . Colon cancer  Neg Hx     CURRENT MEDICATIONS: Reviewed per [facility]  REVIEW OF SYSTEMS:  See HPI otherwise 14 point ROS is negative.  PHYSICAL EXAMINATION  VS:  T 96.5       P 60      RR 18      BP 140/62     POX 98%        WT (Lb) 184    GENERAL: no acute distress, normal body habitus EYES: conjunctivae normal, sclerae normal, normal eye lids MOUTH/THROAT: lips without lesions,no lesions in the mouth,tongue is without lesions,uvula elevates in midline NECK: supple, trachea midline, no neck masses, no thyroid tenderness, no thyromegaly LYMPHATICS: no LAN in the neck, no supraclavicular LAN RESPIRATORY: breathing is even & unlabored, BS CTAB CARDIAC: RRR, no murmur,no extra heart sounds, no edema GI:  ABDOMEN: abdomen soft, normal BS, no masses, no tenderness  LIVER/SPLEEN: no hepatomegaly, no splenomegaly MUSCULOSKELETAL: HEAD: normal to inspection & palpation BACK: no kyphosis, scoliosis or spinal processes tenderness EXTREMITIES: LEFT UPPER EXTREMITY: full range of motion, normal strength & tone RIGHT UPPER EXTREMITY:  full range of motion, normal strength & tone LEFT LOWER EXTREMITY:  full range of motion, normal strength & tone RIGHT LOWER EXTREMITY:  full range of motion, normal strength & tone PSYCHIATRIC: the patient is alert & oriented to person, affect & behavior appropriate  LABS/RADIOLOGY:  INR 1.61.    Sodium 138, potassium 4, chloride 104, CO2 23, BUN 59, creatinine 2.74, glucose 119.    WBC 5800, hemoglobin 8.8, platelets 130.    TSH 1.58.    Chest x-ray:  Showed CHF.    CT of the head:  No acute findings.    MRI of the brain:  No acute findings.    ASSESSMENT/PLAN:  CHF.  Adequately compensated.    Diabetes mellitus with renal complications.  Uncontrolled.  Insulin was adjusted.    Renovascular hypertension.  Blood pressure borderline.  We will monitor.    CAD.  Stable.    Chronic kidney disease stage IV.  Reassess renal functions.    Hypokalemia.  Continue  supplementation.  Reassess potassium level.    Check CBC and BMP.    I have reviewed patient's medical records received at admission/from hospitalization.  CPT CODE: 16109

## 2013-08-07 NOTE — Assessment & Plan Note (Signed)
H/o CVA. Patient now with clear mental status decline: dementia senile vs multi-infarct dementia.  Plan Good supervision at home with sitter and children

## 2013-08-07 NOTE — Assessment & Plan Note (Signed)
Reviewed home [facility] record over past 10 days: a few low readings in the morning that improved with initiation of bedtime snack. He is running a bit high but there is no indication to change  Medication at this time.  Plan Continue Levemir 15 units qHS  No sugar diet.

## 2013-08-07 NOTE — Assessment & Plan Note (Signed)
Stable w/o signs of decompensation. 

## 2013-08-07 NOTE — Assessment & Plan Note (Signed)
Estimated GFR 28. CKD III with acute exacerbations when ill.  Plan Continue to follow with Dr. Allena Katz.

## 2013-08-13 NOTE — Progress Notes (Signed)
Patient ID: [name], male   DOB: [birth year], 79 y.o.   MRN: [id number]        PROGRESS NOTE  DATE: 07/19/2013  FACILITY:  [facility]  LEVEL OF CARE: SNF (31)  Acute Visit  CHIEF COMPLAINT:  Manage chronic kidney disease stage IV and anemia of chronic kidney disease.    HISTORY OF PRESENT ILLNESS: I was requested by the staff to assess the patient regarding above problem(s):  ANEMIA: The anemia has been stable. The patient denies fatigue, melena or hematochezia.  The patient is currently not on iron.   On 07/17/2013:  Hemoglobin 9.5, MCV 89.  At hospital discharge:  Hemoglobin 8.8.    CHRONIC KIDNEY DISEASE: The patient's chronic kidney disease remains stable.  Patient denies increasing lower extremity swelling or confusion. Last BUN and creatinine are:   On 07/17/2013:  BUN 39, creatinine 2.54.  At hospital discharge:  BUN 59, creatinine 2.74.    PAST MEDICAL HISTORY : Reviewed.  No changes.  CURRENT MEDICATIONS: Reviewed per [facility]  REVIEW OF SYSTEMS:  GENERAL: no change in appetite, no fatigue, no weight changes, no fever, chills or weakness RESPIRATORY: no cough, SOB, DOE,, wheezing, hemoptysis CARDIAC: no chest pain, edema or palpitations GI: no abdominal pain, diarrhea, constipation, heart burn, nausea or vomiting  PHYSICAL EXAMINATION  GENERAL: no acute distress, normal body habitus NECK: supple, trachea midline, no neck masses, no thyroid tenderness, no thyromegaly RESPIRATORY: breathing is even & unlabored, BS CTAB CARDIAC: RRR, no murmur,no extra heart sounds   EDEMA/VARICOSITIES:  +1 bilateral lower extremity edema    GI: abdomen soft, normal BS, no masses, no tenderness, no hepatomegaly, no splenomegaly PSYCHIATRIC: the patient is alert & oriented to person, affect & behavior appropriate  ASSESSMENT/PLAN:  Anemia of chronic kidney disease.  Hemoglobin improved.     Chronic kidney disease stage IV.  Renal functions improved.    CPT CODE:  40981

## 2013-08-21 ENCOUNTER — Encounter: Payer: Self-pay | Admitting: Cardiology

## 2013-08-21 ENCOUNTER — Ambulatory Visit (HOSPITAL_COMMUNITY): Payer: Medicare Other | Attending: Cardiology

## 2013-08-21 ENCOUNTER — Ambulatory Visit (INDEPENDENT_AMBULATORY_CARE_PROVIDER_SITE_OTHER): Payer: Medicare Other | Admitting: Cardiology

## 2013-08-21 VITALS — BP 160/78 | HR 56 | Ht 71.0 in | Wt 193.0 lb

## 2013-08-21 DIAGNOSIS — I10 Essential (primary) hypertension: Secondary | ICD-10-CM | POA: Insufficient documentation

## 2013-08-21 DIAGNOSIS — I509 Heart failure, unspecified: Secondary | ICD-10-CM

## 2013-08-21 DIAGNOSIS — Z951 Presence of aortocoronary bypass graft: Secondary | ICD-10-CM | POA: Insufficient documentation

## 2013-08-21 DIAGNOSIS — N184 Chronic kidney disease, stage 4 (severe): Secondary | ICD-10-CM

## 2013-08-21 DIAGNOSIS — I2721 Secondary pulmonary arterial hypertension: Secondary | ICD-10-CM

## 2013-08-21 DIAGNOSIS — E785 Hyperlipidemia, unspecified: Secondary | ICD-10-CM

## 2013-08-21 DIAGNOSIS — I2789 Other specified pulmonary heart diseases: Secondary | ICD-10-CM

## 2013-08-21 DIAGNOSIS — Z8679 Personal history of other diseases of the circulatory system: Secondary | ICD-10-CM

## 2013-08-21 DIAGNOSIS — I6529 Occlusion and stenosis of unspecified carotid artery: Secondary | ICD-10-CM

## 2013-08-21 DIAGNOSIS — I2510 Atherosclerotic heart disease of native coronary artery without angina pectoris: Secondary | ICD-10-CM

## 2013-08-21 DIAGNOSIS — I4892 Unspecified atrial flutter: Secondary | ICD-10-CM

## 2013-08-21 DIAGNOSIS — I739 Peripheral vascular disease, unspecified: Secondary | ICD-10-CM | POA: Insufficient documentation

## 2013-08-21 DIAGNOSIS — I25810 Atherosclerosis of coronary artery bypass graft(s) without angina pectoris: Secondary | ICD-10-CM

## 2013-08-21 DIAGNOSIS — I5033 Acute on chronic diastolic (congestive) heart failure: Secondary | ICD-10-CM

## 2013-08-21 DIAGNOSIS — Z87891 Personal history of nicotine dependence: Secondary | ICD-10-CM | POA: Insufficient documentation

## 2013-08-21 DIAGNOSIS — E119 Type 2 diabetes mellitus without complications: Secondary | ICD-10-CM | POA: Insufficient documentation

## 2013-08-21 DIAGNOSIS — I658 Occlusion and stenosis of other precerebral arteries: Secondary | ICD-10-CM | POA: Insufficient documentation

## 2013-08-21 LAB — PROTIME-INR
INR: 1.7 ratio — ABNORMAL HIGH (ref 0.8–1.0)
Prothrombin Time: 18.3 s — ABNORMAL HIGH (ref 10.2–12.4)

## 2013-08-21 NOTE — Assessment & Plan Note (Signed)
Symptoms are relatively stable. Continue present dose of diuretic.

## 2013-08-21 NOTE — Assessment & Plan Note (Signed)
Patient appears to be euvolemic on examination. Continue present dose of diuretic. 

## 2013-08-21 NOTE — Assessment & Plan Note (Signed)
Continue statin. Discontinue aspirin given need for Coumadin. 

## 2013-08-21 NOTE — Assessment & Plan Note (Signed)
Plan continue Coumadin. Once he has been therapeutic for 3 consecutive weeks we will plan cardioversion. If he does not hold sinus rhythm rate control and anticoagulation may be best long-term option. Continue carvedilol. I will hold that in the morning of his cardioversion.

## 2013-08-21 NOTE — Assessment & Plan Note (Addendum)
Await followup carotid Dopplers performed today. Continue statin. Discontinue aspirin given need for Coumadin.

## 2013-08-21 NOTE — Addendum Note (Signed)
Addended by: Tonita Phoenix on: 08/21/2013 11:22 AM   Modules accepted: Orders

## 2013-08-21 NOTE — Progress Notes (Signed)
HPI: FU pulmonary HTN, CAD, diastolic CHF, ESRD, MGUS, carotid stenosis. He is s/p CABG in 2006. Carotid Dopplers 12/13: 0-39% RICA, 60-70% LICA; fu in 6 months. LHC 3/13: occluded LAD, mid and dist LAD filled from the SVG, apical LAD 99% (unchanged), dCFX occluded prior to OM2, OM2 filled via the SVG, RCA prox 25%, mid 40% and distal 25%, SVG to Dx was occluded. Medical therapy recommended.  Patient also with pulmonary HTN; V/Q scan 10/13 was negative for pulmonary embolism. ANA and HIV were both negative. Bubble study was negative for obvious shunt. RHC due to his significant pulmonary hypertension: PA 60/23, mean 38, PCWP 16, CO 8.75. Hemodynamics did not appear to be consistent with pericardial constriction. High cardiac output was thought to be inaccurate. Etiology of his pulmonary HTN was unclear. PFTs revealed restrictive lung disease.  Last seen by Dr. [name] 01/04/13. Followup carotid Dopplers were due in 6/14. Patient seen in September of 2014 and noted to have new onset atrial flutter. Echocardiogram repeated in October 2014 and showed normal LV function, moderate left atrial enlargement, right atrial and right ventricular enlargement, moderate tricuspid regurgitation and severely elevated pulmonary pressures. Patient admitted in October 2014 with hypoglycemia and required intubation to protect his airway. Patient at present has some dyspnea on exertion with extreme activities. No orthopnea or PND. Occasional mild pedal edema. No chest pain or syncope.   Current Outpatient Prescriptions  Medication Sig Dispense Refill  . ADVOCATE LANCETS MISC       . Alcohol Swabs (ALCOHOL PREP) 70 % PADS       . amLODipine (NORVASC) 10 MG tablet Take 10 mg by mouth daily.      Marland Kitchen aspirin EC 81 MG tablet Take 1 tablet (81 mg total) by mouth daily.  90 tablet  3  . atorvastatin (LIPITOR) 20 MG tablet Take 1 tablet (20 mg total) by mouth at bedtime.  30 tablet  3  . Blood Glucose Calibration (OT  ULTRA/FASTTK CNTRL SOLN) SOLN       . carvedilol (COREG) 6.25 MG tablet Take 6.25 mg by mouth 2 (two) times daily.      . furosemide (LASIX) 80 MG tablet Take 240 mg by mouth daily. After 3 days reduce dose to 80 mg daily      . insulin detemir (LEVEMIR) 100 UNIT/ML injection Inject 0.15 mLs (15 Units total) into the skin at bedtime.  10 mL  2  . Lancet Devices (ADVOCATE RAPID-SAFE LANCING) MISC       . ONE TOUCH ULTRA TEST test strip       . potassium chloride SA (K-DUR,KLOR-CON) 20 MEQ tablet Take 1 tablet (20 mEq total) by mouth 3 (three) times daily.  90 tablet  3  . warfarin (COUMADIN) 5 MG tablet Take 1 tablet (5 mg total) by mouth daily. Take 5mg  Daily except.Saturday and Sunday take 2.5mg   30 tablet  2   No current facility-administered medications for this visit.     Past Medical History  Diagnosis Date  . CAD (coronary artery disease)     s/p 3v CABG 2006, myoview 04/2010 EF 49%, prior inferior/apical infarct, no ischemia, LHC 11/2011 stable anatomy (occluded LAD filled from vein graft, distal LCx occluded prior to OM2, OM2 filled from vein graft, RCA prox 25%, mid 40%, distal 25% lesions, SVG to diagonal occluded) Med Rx  . Chronic diastolic heart failure     hx of cardiorenal syndrome;  Echo 06/2012 EF 50-55%, mod diastolic dysfunction,  mild MR, mod TR, mild R/LAE, mild RV dilatation, PASP .  . Carotid stenosis     a. Carotid dopplers R 40-59%, Left 60-79%;   b. carotids 12/13:  0-39% RICA, 60-79% LICA (rpt in 6 mos)  . HLD (hyperlipidemia)   . Mediastinal adenopathy     per CT chest in 2006 with PET scan showing very limited metabolic activity; not felt to have a significant neoplastic potential  . Iron deficiency anemia   . Hypertension   . Diabetes mellitus     insulin dependent   . GERD (gastroesophageal reflux disease)   . AV block, Mobitz 1     Noted 11/2011 in hospital, BB stopped  . Colon polyps   . Diverticulosis   . Pulmonary arterial hypertension     a.   RHC 11/1 PASP 60 mmHg (mean 38); b. RHC 11/15 PA pressure 66/24, PCWP 26 and CO 6.2 => Sildenafil started;   c. Echo bubble study 11/13:  no obvious shunt;   d. VQ scan neg for pulmonary embolism  . CKD (chronic kidney disease) stage 5, GFR less than 15 ml/min     hx of cardiorenal syndrome;  Dr Allena Katz;  AVF recommended and scheduled (patient hesitant to proceed);   Renal US 11/13: diff echogenic kidneys c/w medical renal disease; no hydronephrosis or renal mass  . Hyperparathyroidism, secondary renal   . MGUS (monoclonal gammopathy of unknown significance)     a. Kappa/Lambda free light chain ratio 11/13:  23.99;  b. Met. Bone survey 11/13:  no osteolytic lesions to suggest MMyeloma  . Atrial flutter     diagnosed 05/2013; Coumadin initiated    Past Surgical History  Procedure Laterality Date  . Appendectomy    . Coronary artery bypass graft  2006    SVG to OM2, SVG to LAD, SVG to DX; (the LIMA did not have good flow and therefore was not used)  . Cardiac catheterization  12/21/2011    History   Social History  . Marital Status: Widowed    Spouse Name: N/A    Number of Children: 12  . Years of Education: N/A   Occupational History  . Retired    Social History Main Topics  . Smoking status: Former Smoker    Quit date: 09/27/1953  . Smokeless tobacco: Never Used  . Alcohol Use: No  . Drug Use: No  . Sexual Activity: No   Other Topics Concern  . Not on file   Social History Narrative   10th grade. Married '64-10 yrs/widowed; married '77 - 44yrs/widowed. 7 sons, 5 daughters; 30+ grandchildren, 15 great-grands. 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 His son checks on him all the time.     ROS: no fevers or chills, productive cough, hemoptysis, dysphasia, odynophagia, melena, hematochezia, dysuria, hematuria, rash, seizure activity, orthopnea, PND, pedal edema, claudication. Remaining systems are negative.  Physical  Exam: Well-developed well-nourished in no acute distress.  Skin is warm and dry.  HEENT is normal.  Neck is supple.  Chest is clear to auscultation with normal expansion.  Cardiovascular exam is irregular Abdominal exam nontender or distended. No masses palpated. Extremities show 1+ edema. neuro grossly intact  ECG atrial flutter at a rate of 61. Left axis deviation. Lateral T-wave inversion.

## 2013-08-21 NOTE — Addendum Note (Signed)
Addended by: Bevin Mayall K on: 08/21/2013 11:21 AM   Modules accepted: Orders  

## 2013-08-21 NOTE — Addendum Note (Signed)
Addended by: Tonita Phoenix on: 08/21/2013 11:21 AM   Modules accepted: Orders

## 2013-08-21 NOTE — Assessment & Plan Note (Signed)
Continue present blood pressure medications. 

## 2013-08-21 NOTE — Patient Instructions (Signed)
Your physician recommends that you schedule a follow-up appointment in: 3 MONTHS WITH DR CRENSHAW  Your physician recommends that you HAVE LAB WORK TODAY  

## 2013-08-21 NOTE — Assessment & Plan Note (Signed)
followup nephrology.

## 2013-08-21 NOTE — Assessment & Plan Note (Signed)
Continue statin. 

## 2013-08-22 ENCOUNTER — Ambulatory Visit (INDEPENDENT_AMBULATORY_CARE_PROVIDER_SITE_OTHER): Payer: Medicare Other | Admitting: Cardiology

## 2013-08-22 ENCOUNTER — Telehealth: Payer: Self-pay | Admitting: Cardiology

## 2013-08-22 ENCOUNTER — Encounter: Payer: Self-pay | Admitting: Physician Assistant

## 2013-08-22 DIAGNOSIS — Z7901 Long term (current) use of anticoagulants: Secondary | ICD-10-CM

## 2013-08-22 DIAGNOSIS — I4892 Unspecified atrial flutter: Secondary | ICD-10-CM

## 2013-08-22 MED ORDER — WARFARIN SODIUM 6 MG PO TABS: 6.0000 mg | ORAL_TABLET | ORAL | Status: DC

## 2013-08-22 NOTE — Telephone Encounter (Signed)
Gave Amber ok to do INR check on 08/29/13 and call results to [place].

## 2013-08-22 NOTE — Telephone Encounter (Signed)
Offer     Amber Harris @ [facility] is offierin to do pt PT/INR at home visits. Would like OK on this give them a call back.

## 2013-08-29 ENCOUNTER — Ambulatory Visit (INDEPENDENT_AMBULATORY_CARE_PROVIDER_SITE_OTHER): Payer: Medicare Other | Admitting: Pharmacist

## 2013-08-29 DIAGNOSIS — I4892 Unspecified atrial flutter: Secondary | ICD-10-CM

## 2013-08-29 DIAGNOSIS — Z7901 Long term (current) use of anticoagulants: Secondary | ICD-10-CM

## 2013-08-29 LAB — POCT INR: INR: 1.7

## 2013-09-05 ENCOUNTER — Ambulatory Visit (INDEPENDENT_AMBULATORY_CARE_PROVIDER_SITE_OTHER): Payer: Medicare Other | Admitting: Pharmacist

## 2013-09-05 ENCOUNTER — Other Ambulatory Visit: Payer: Self-pay

## 2013-09-05 DIAGNOSIS — I2510 Atherosclerotic heart disease of native coronary artery without angina pectoris: Secondary | ICD-10-CM

## 2013-09-05 DIAGNOSIS — N184 Chronic kidney disease, stage 4 (severe): Secondary | ICD-10-CM

## 2013-09-05 DIAGNOSIS — Z8679 Personal history of other diseases of the circulatory system: Secondary | ICD-10-CM

## 2013-09-05 DIAGNOSIS — I10 Essential (primary) hypertension: Secondary | ICD-10-CM

## 2013-09-05 DIAGNOSIS — I4892 Unspecified atrial flutter: Secondary | ICD-10-CM

## 2013-09-05 DIAGNOSIS — E785 Hyperlipidemia, unspecified: Secondary | ICD-10-CM

## 2013-09-05 DIAGNOSIS — I2721 Secondary pulmonary arterial hypertension: Secondary | ICD-10-CM

## 2013-09-05 DIAGNOSIS — Z7901 Long term (current) use of anticoagulants: Secondary | ICD-10-CM

## 2013-09-05 DIAGNOSIS — I5033 Acute on chronic diastolic (congestive) heart failure: Secondary | ICD-10-CM

## 2013-09-05 LAB — POCT INR: INR: 2.4

## 2013-09-05 MED ORDER — FUROSEMIDE 80 MG PO TABS: 240.0000 mg | ORAL_TABLET | Freq: Every day | ORAL | Status: DC

## 2013-09-05 MED ORDER — CARVEDILOL 6.25 MG PO TABS: 6.2500 mg | ORAL_TABLET | Freq: Two times a day (BID) | ORAL | Status: DC

## 2013-09-12 ENCOUNTER — Ambulatory Visit (INDEPENDENT_AMBULATORY_CARE_PROVIDER_SITE_OTHER): Payer: Medicare Other | Admitting: Pharmacist

## 2013-09-12 DIAGNOSIS — Z7901 Long term (current) use of anticoagulants: Secondary | ICD-10-CM

## 2013-09-12 DIAGNOSIS — I4892 Unspecified atrial flutter: Secondary | ICD-10-CM

## 2013-09-12 LAB — POCT INR: INR: 2

## 2013-09-14 ENCOUNTER — Other Ambulatory Visit: Payer: Self-pay | Admitting: Cardiology

## 2013-09-18 ENCOUNTER — Other Ambulatory Visit: Payer: Self-pay | Admitting: Physician Assistant

## 2013-09-18 ENCOUNTER — Ambulatory Visit (INDEPENDENT_AMBULATORY_CARE_PROVIDER_SITE_OTHER): Payer: Medicare Other | Admitting: Cardiovascular Disease

## 2013-09-18 ENCOUNTER — Other Ambulatory Visit: Payer: Self-pay

## 2013-09-18 DIAGNOSIS — I4892 Unspecified atrial flutter: Secondary | ICD-10-CM

## 2013-09-18 DIAGNOSIS — Z7901 Long term (current) use of anticoagulants: Secondary | ICD-10-CM

## 2013-09-18 LAB — POCT INR: INR: 1.9

## 2013-09-18 MED ORDER — POTASSIUM CHLORIDE CRYS ER 20 MEQ PO TBCR: 20.0000 meq | EXTENDED_RELEASE_TABLET | Freq: Three times a day (TID) | ORAL | Status: DC

## 2013-09-25 ENCOUNTER — Ambulatory Visit (INDEPENDENT_AMBULATORY_CARE_PROVIDER_SITE_OTHER): Payer: Medicare Other | Admitting: Cardiology

## 2013-09-25 DIAGNOSIS — I4892 Unspecified atrial flutter: Secondary | ICD-10-CM

## 2013-09-25 DIAGNOSIS — Z7901 Long term (current) use of anticoagulants: Secondary | ICD-10-CM

## 2013-09-28 ENCOUNTER — Other Ambulatory Visit (INDEPENDENT_AMBULATORY_CARE_PROVIDER_SITE_OTHER): Payer: Medicare Other

## 2013-09-28 DIAGNOSIS — E785 Hyperlipidemia, unspecified: Secondary | ICD-10-CM

## 2013-09-28 DIAGNOSIS — E119 Type 2 diabetes mellitus without complications: Secondary | ICD-10-CM

## 2013-09-28 DIAGNOSIS — I10 Essential (primary) hypertension: Secondary | ICD-10-CM

## 2013-09-28 LAB — LIPID PANEL
CHOL/HDL RATIO: 3
Cholesterol: 79 mg/dL (ref 0–200)
HDL: 23.5 mg/dL — ABNORMAL LOW (ref >39.00)
LDL CALC: 40 mg/dL (ref 0–99)
Triglycerides: 80 mg/dL (ref 0.0–149.0)
VLDL: 16 mg/dL (ref 0.0–40.0)

## 2013-09-28 LAB — HEPATIC FUNCTION PANEL
ALK PHOS: 119 U/L — AB (ref 39–117)
ALT: 26 U/L (ref 0–53)
AST: 31 U/L (ref 0–37)
Albumin: 3.5 g/dL (ref 3.5–5.2)
Bilirubin, Direct: 0.1 mg/dL (ref 0.0–0.3)
Total Bilirubin: 0.6 mg/dL (ref 0.3–1.2)
Total Protein: 7.5 g/dL (ref 6.0–8.3)

## 2013-09-28 LAB — COMPREHENSIVE METABOLIC PANEL
ALBUMIN: 3.5 g/dL (ref 3.5–5.2)
ALT: 26 U/L (ref 0–53)
AST: 31 U/L (ref 0–37)
Alkaline Phosphatase: 119 U/L — ABNORMAL HIGH (ref 39–117)
BUN: 51 mg/dL — ABNORMAL HIGH (ref 6–23)
CALCIUM: 9.3 mg/dL (ref 8.4–10.5)
CHLORIDE: 107 meq/L (ref 96–112)
CO2: 22 mEq/L (ref 19–32)
Creatinine, Ser: 2.6 mg/dL — ABNORMAL HIGH (ref 0.4–1.5)
GFR: 30.49 mL/min — ABNORMAL LOW (ref >60.00)
Glucose, Bld: 84 mg/dL (ref 70–99)
POTASSIUM: 5.3 meq/L — AB (ref 3.5–5.1)
Sodium: 137 mEq/L (ref 135–145)
Total Bilirubin: 0.6 mg/dL (ref 0.3–1.2)
Total Protein: 7.5 g/dL (ref 6.0–8.3)

## 2013-09-28 LAB — HEMOGLOBIN A1C: HEMOGLOBIN A1C: 8.3 % — AB (ref 4.6–6.5)

## 2013-09-29 ENCOUNTER — Encounter: Payer: Self-pay | Admitting: Internal Medicine

## 2013-09-29 DIAGNOSIS — E119 Type 2 diabetes mellitus without complications: Secondary | ICD-10-CM

## 2013-10-01 ENCOUNTER — Ambulatory Visit (INDEPENDENT_AMBULATORY_CARE_PROVIDER_SITE_OTHER): Payer: Medicare Other | Admitting: Internal Medicine

## 2013-10-01 ENCOUNTER — Encounter: Payer: Self-pay | Admitting: Internal Medicine

## 2013-10-01 ENCOUNTER — Telehealth: Payer: Self-pay | Admitting: Internal Medicine

## 2013-10-01 ENCOUNTER — Ambulatory Visit (INDEPENDENT_AMBULATORY_CARE_PROVIDER_SITE_OTHER): Payer: Medicare Other | Admitting: Pharmacist

## 2013-10-01 VITALS — BP 158/80 | HR 60 | Temp 98.0°F | Wt 195.4 lb

## 2013-10-01 DIAGNOSIS — N185 Chronic kidney disease, stage 5: Secondary | ICD-10-CM

## 2013-10-01 DIAGNOSIS — E119 Type 2 diabetes mellitus without complications: Secondary | ICD-10-CM

## 2013-10-01 DIAGNOSIS — I4892 Unspecified atrial flutter: Secondary | ICD-10-CM

## 2013-10-01 DIAGNOSIS — Z7901 Long term (current) use of anticoagulants: Secondary | ICD-10-CM

## 2013-10-01 DIAGNOSIS — E785 Hyperlipidemia, unspecified: Secondary | ICD-10-CM

## 2013-10-01 DIAGNOSIS — I10 Essential (primary) hypertension: Secondary | ICD-10-CM

## 2013-10-01 DIAGNOSIS — I2589 Other forms of chronic ischemic heart disease: Secondary | ICD-10-CM

## 2013-10-01 LAB — POCT INR: INR: 4.6

## 2013-10-01 MED ORDER — SAXAGLIPTIN HCL 2.5 MG PO TABS: 2.5000 mg | ORAL_TABLET | Freq: Every day | ORAL | Status: DC

## 2013-10-01 NOTE — Telephone Encounter (Signed)
10/01/2013  Pt is leaving, but would like for Dr. Linda Hedges to contact him.  Would not disclose reasons why, but says he forgot to discuss something with Dr. Linda Hedges.  [phone]

## 2013-10-01 NOTE — Patient Instructions (Signed)
Happy New Year.  You seem to be doing OK. But, the blood sugar has been too high with an A1C of 8.3 %  Plan Will start Onglyza 2.5 mg tablet once a day to help get the blood sugar down.  Kidney and heart per Drs. Patel and Crenshaw  Try to behave.

## 2013-10-01 NOTE — Telephone Encounter (Signed)
10/01/2013 pt checkout out from appt today, but came back in because he says there is something he forgot to talk to Dr. Linda Hedges about.  Pt says he does not want to leave until he speaks with Dr. Linda Hedges.  Pt is in lobby.  Please advise.

## 2013-10-01 NOTE — Progress Notes (Signed)
Pre visit review using our clinic review tool, if applicable. No additional management support is needed unless otherwise documented below in the visit note. 

## 2013-10-01 NOTE — Progress Notes (Signed)
Subjective:    Patient ID: [name], male    DOB: Jun 05, 1931, 80 y.o.   MRN: [id number]  HPI discharged from [facility] oct 10th after profound hypoglycemia. He then went to SNF and then home with Home Health.  [name] is at home and has been doing well. He has not been SOB, he has had good CBGs most of the time and he has been feeling well. He has had a little back pain that is easily managed. He does require in home assistance for ADLs, especially bathing, meal preparation and housework.  He has been taking all his medications as ordered. He continues to have controlled INRs.   He is current with Dr. Posey Pronto for nephrology. He has had stable heart problems with atrial fibrillation - having seen Dr. Stanford Breed November 25. He is a candidate for cardioversion once his INR is at the right level.   Reviewed labs from Jan 2nd - stable results except A1C 8.3%. Past Medical History  Diagnosis Date  . CAD (coronary artery disease)     s/p 3v CABG 2006, myoview 04/2010 EF 49%, prior inferior/apical infarct, no ischemia, LHC 11/2011 stable anatomy (occluded LAD filled from vein graft, distal LCx occluded prior to OM2, OM2 filled from vein graft, RCA prox 25%, mid 40%, distal 25% lesions, SVG to diagonal occluded) Med Rx  . Chronic diastolic heart failure     hx of cardiorenal syndrome;  Echo 06/2012 EF 63-87%, mod diastolic dysfunction, mild MR, mod TR, mild R/LAE, mild RV dilatation, PASP 42mmHg.  . Carotid stenosis     a. Carotid dopplers R 40-59%, Left 60-79%;   b. carotids 12/13:  5-64% RICA, 33-29% LICA (rpt in 6 mos);  c.  Carotid US (11/14):  R 40-59%; L 60-79%, R vertebral occluded, L vertebral antegrade  . HLD (hyperlipidemia)   . Mediastinal adenopathy     per CT chest in 2006 with PET scan showing very limited metabolic activity; not felt to have a significant neoplastic potential  . Iron deficiency anemia   . Hypertension   . Diabetes mellitus     insulin dependent   . GERD  (gastroesophageal reflux disease)   . AV block, Mobitz 1     Noted 11/2011 in hospital, BB stopped  . Colon polyps   . Diverticulosis   . Pulmonary arterial hypertension     a.  RHC 11/1 PASP 60 mmHg (mean 38); b. RHC 11/15 PA pressure 66/24, PCWP 26 and CO 6.2 => Sildenafil started;   c. Echo bubble study 11/13:  no obvious shunt;   d. VQ scan neg for pulmonary embolism  . CKD (chronic kidney disease) stage 5, GFR less than 15 ml/min     hx of cardiorenal syndrome;  Dr Posey Pronto;  AVF recommended and scheduled (patient hesitant to proceed);   Renal US 11/13: diff echogenic kidneys c/w medical renal disease; no hydronephrosis or renal mass  . Hyperparathyroidism, secondary renal   . MGUS (monoclonal gammopathy of unknown significance)     a. Kappa/Lambda free light chain ratio 11/13:  23.99;  b. Met. Bone survey 11/13:  no osteolytic lesions to suggest MMyeloma  . Atrial flutter     diagnosed 05/2013; Coumadin initiated   Past Surgical History  Procedure Laterality Date  . Appendectomy    . Coronary artery bypass graft  2006    SVG to OM2, SVG to LAD, SVG to DX; (the LIMA did not have good flow and therefore was not used)  .  Cardiac catheterization  12/21/2011   Family History  Problem Relation Age of Onset  . Stomach cancer Sister   . Pancreatic cancer Brother   . Lung cancer Brother   . Colon cancer Neg Hx    History   Social History  . Marital Status: Widowed    Spouse Name: N/A    Number of Children: 12  . Years of Education: N/A   Occupational History  . Retired    Social History Main Topics  . Smoking status: Former Smoker    Quit date: 09/27/1953  . Smokeless tobacco: Never Used  . Alcohol Use: No  . Drug Use: No  . Sexual Activity: No   Other Topics Concern  . Not on file   Social History Narrative   10th grade. Married '64-10 yrs/widowed; married '77 - 69yrs/widowed. 7 sons, 5 daughters; 30+ grandchildren, 15 great-grands. 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 Lives alone, outdoor cat. His son checks on him all the time.      Current Outpatient Prescriptions on File Prior to Visit  Medication Sig Dispense Refill  . ADVOCATE LANCETS MISC       . Alcohol Swabs (ALCOHOL PREP) 70 % PADS       . amLODipine (NORVASC) 10 MG tablet Take 10 mg by mouth daily.      Marland Kitchen atorvastatin (LIPITOR) 20 MG tablet take 1 tablet by mouth at bedtime  30 tablet  3  . Blood Glucose Calibration (OT ULTRA/FASTTK CNTRL SOLN) SOLN       . carvedilol (COREG) 6.25 MG tablet Take 1 tablet (6.25 mg total) by mouth 2 (two) times daily.  180 tablet  3  . furosemide (LASIX) 80 MG tablet Take 3 tablets (240 mg total) by mouth daily. After 3 days reduce dose to 80 mg daily  180 tablet  3  . insulin detemir (LEVEMIR) 100 UNIT/ML injection Inject 0.15 mLs (15 Units total) into the skin at bedtime.  10 mL  2  . Lancet Devices (ADVOCATE RAPID-SAFE LANCING) MISC       . ONE TOUCH ULTRA TEST test strip       . potassium chloride SA (K-DUR,KLOR-CON) 20 MEQ tablet Take 1 tablet (20 mEq total) by mouth 3 (three) times daily.  90 tablet  3  . warfarin (COUMADIN) 6 MG tablet Take 1 tablet (6 mg total) by mouth as directed.  40 tablet  3   No current facility-administered medications on file prior to visit.       Review of Systems System review is negative for any constitutional, cardiac, pulmonary, GI or neuro symptoms or complaints other than as described in the HPI.     Objective:   Physical Exam Filed Vitals:   10/01/13 1046  BP: 158/80  Pulse: 60  Temp: 98 F (36.7 C)   Wt Readings from Last 3 Encounters:  10/01/13 195 lb 6.4 oz (88.633 kg)  08/21/13 193 lb (87.544 kg)  08/06/13 191 lb (86.637 kg)   Gen'l- older AA man in no distress - still has bone crushing handshake, but not as strong as in the past. HEENT- C&S clear, mild temporal wasting Cor 2+ radial pulse, IRIR Pulm - normal respirations, no rales, no wheezing Neuro - A&O x 3, speech is  clear and cognition seems to be at baseline. Ambulates w/o assistance  Recent Results (from the past 2160 hour(s))  GLUCOSE, CAPILLARY     Status: Abnormal   Collection Time  07/04/13  6:26 AM      Result Value Range   Glucose-Capillary 69 (*) 70 - 99 mg/dL  PROTIME-INR     Status: Abnormal   Collection Time    07/04/13  6:50 AM      Result Value Range   Prothrombin Time 18.7 (*) 11.6 - 15.2 seconds   INR 1.61 (*) 0.00 - 1.49  GLUCOSE, CAPILLARY     Status: None   Collection Time    07/04/13 11:44 AM      Result Value Range   Glucose-Capillary 94  70 - 99 mg/dL   Comment 1 Documented in Chart     Comment 2 Notify RN    GLUCOSE, CAPILLARY     Status: Abnormal   Collection Time    07/04/13  4:11 PM      Result Value Range   Glucose-Capillary 115 (*) 70 - 99 mg/dL   Comment 1 Documented in Chart     Comment 2 Notify RN    GLUCOSE, CAPILLARY     Status: Abnormal   Collection Time    07/04/13  9:34 PM      Result Value Range   Glucose-Capillary 193 (*) 70 - 99 mg/dL   Comment 1 Notify RN     Comment 2 Documented in Chart    GLUCOSE, CAPILLARY     Status: Abnormal   Collection Time    07/05/13  6:02 AM      Result Value Range   Glucose-Capillary 102 (*) 70 - 99 mg/dL   Comment 1 Notify RN     Comment 2 Documented in Chart    PROTIME-INR     Status: Abnormal   Collection Time    07/05/13  6:35 AM      Result Value Range   Prothrombin Time 19.4 (*) 11.6 - 15.2 seconds   INR 1.69 (*) 0.00 - 3.22  BASIC METABOLIC PANEL     Status: Abnormal   Collection Time    07/05/13  6:35 AM      Result Value Range   Sodium 139  135 - 145 mEq/L   Potassium 4.1  3.5 - 5.1 mEq/L   Chloride 108  96 - 112 mEq/L   CO2 20  19 - 32 mEq/L   Glucose, Bld 83  70 - 99 mg/dL   BUN 48 (*) 6 - 23 mg/dL   Creatinine, Ser 2.37 (*) 0.50 - 1.35 mg/dL   Calcium 9.0  8.4 - 10.5 mg/dL   GFR calc non Af Amer 24 (*) >90 mL/min   GFR calc Af Amer 28 (*) >90 mL/min   Comment: (NOTE)     The eGFR has  been calculated using the CKD EPI equation.     This calculation has not been validated in all clinical situations.     eGFR's persistently <90 mL/min signify possible Chronic Kidney     Disease.  CBC     Status: Abnormal   Collection Time    07/05/13  6:35 AM      Result Value Range   WBC 4.7  4.0 - 10.5 K/uL   RBC 3.01 (*) 4.22 - 5.81 MIL/uL   Hemoglobin 8.9 (*) 13.0 - 17.0 g/dL   HCT 26.3 (*) 39.0 - 52.0 %   MCV 87.4  78.0 - 100.0 fL   MCH 29.6  26.0 - 34.0 pg   MCHC 33.8  30.0 - 36.0 g/dL   RDW 15.2  11.5 - 15.5 %  Platelets 142 (*) 150 - 400 K/uL  GLUCOSE, CAPILLARY     Status: Abnormal   Collection Time    07/05/13 11:32 AM      Result Value Range   Glucose-Capillary 100 (*) 70 - 99 mg/dL  GLUCOSE, CAPILLARY     Status: Abnormal   Collection Time    07/05/13  4:30 PM      Result Value Range   Glucose-Capillary 177 (*) 70 - 99 mg/dL   Comment 1 Notify RN    GLUCOSE, CAPILLARY     Status: Abnormal   Collection Time    07/05/13  9:04 PM      Result Value Range   Glucose-Capillary 153 (*) 70 - 99 mg/dL   Comment 1 Documented in Chart     Comment 2 Notify RN    PROTIME-INR     Status: Abnormal   Collection Time    07/06/13  5:30 AM      Result Value Range   Prothrombin Time 19.0 (*) 11.6 - 15.2 seconds   INR 1.64 (*) 0.00 - 1.49  GLUCOSE, CAPILLARY     Status: Abnormal   Collection Time    07/06/13  5:52 AM      Result Value Range   Glucose-Capillary 66 (*) 70 - 99 mg/dL  GLUCOSE, CAPILLARY     Status: Abnormal   Collection Time    07/06/13  6:27 AM      Result Value Range   Glucose-Capillary 102 (*) 70 - 99 mg/dL  PROTIME-INR     Status: Abnormal   Collection Time    08/21/13 11:22 AM      Result Value Range   Prothrombin Time 18.3 (*) 10.2 - 12.4 sec   INR 1.7 (*) 0.8 - 1.0 ratio  POCT INR     Status: None   Collection Time    08/29/13 12:00 AM      Result Value Range   INR 1.7     Comment: [facility]  POCT INR     Status: None   Collection Time     09/05/13 12:00 AM      Result Value Range   INR 2.4     Comment: [facility]  POCT INR     Status: None   Collection Time    09/12/13 12:00 AM      Result Value Range   INR 2.0     Comment: [facility]  POCT INR     Status: None   Collection Time    09/18/13 12:00 AM      Result Value Range   INR 1.9     Comment: [facility]   POCT INR     Status: None   Collection Time    09/25/13 12:00 AM      Result Value Range   INR 5.4     Comment: [facility]  HEMOGLOBIN A1C     Status: Abnormal   Collection Time    09/28/13 12:38 PM      Result Value Range   Hemoglobin A1C 8.3 (*) 4.6 - 6.5 %   Comment: Glycemic Control Guidelines for People with Diabetes:Non Diabetic:  <6%Goal of Therapy: <7%Additional Action Suggested:  >8%   COMPREHENSIVE METABOLIC PANEL     Status: Abnormal   Collection Time    09/28/13 12:38 PM      Result Value Range   Sodium 137  135 - 145 mEq/L   Potassium 5.3 (*) 3.5 - 5.1 mEq/L   Chloride  107  96 - 112 mEq/L   CO2 22  19 - 32 mEq/L   Glucose, Bld 84  70 - 99 mg/dL   BUN 51 (*) 6 - 23 mg/dL   Creatinine, Ser 2.6 (*) 0.4 - 1.5 mg/dL   Total Bilirubin 0.6  0.3 - 1.2 mg/dL   Alkaline Phosphatase 119 (*) 39 - 117 U/L   AST 31  0 - 37 U/L   ALT 26  0 - 53 U/L   Total Protein 7.5  6.0 - 8.3 g/dL   Albumin 3.5  3.5 - 5.2 g/dL   Calcium 9.3  8.4 - 10.5 mg/dL   GFR 30.49 (*) >60.00 mL/min  LIPID PANEL     Status: Abnormal   Collection Time    09/28/13 12:38 PM      Result Value Range   Cholesterol 79  0 - 200 mg/dL   Comment: ATP III Classification       Desirable:  < 200 mg/dL               Borderline High:  200 - 239 mg/dL          High:  > = 240 mg/dL   Triglycerides 80.0  0.0 - 149.0 mg/dL   Comment: Normal:  <150 mg/dLBorderline High:  150 - 199 mg/dL   HDL 23.50 (*) >39.00 mg/dL   VLDL 16.0  0.0 - 40.0 mg/dL   LDL Cholesterol 40  0 - 99 mg/dL   Total CHOL/HDL Ratio 3     Comment:                Men          Women1/2 Average Risk     3.4           3.3Average Risk          5.0          4.42X Average Risk          9.6          7.13X Average Risk          15.0          11.0                      HEPATIC FUNCTION PANEL     Status: Abnormal   Collection Time    09/28/13 12:38 PM      Result Value Range   Total Bilirubin 0.6  0.3 - 1.2 mg/dL   Bilirubin, Direct 0.1  0.0 - 0.3 mg/dL   Alkaline Phosphatase 119 (*) 39 - 117 U/L   AST 31  0 - 37 U/L   ALT 26  0 - 53 U/L   Total Protein 7.5  6.0 - 8.3 g/dL   Albumin 3.5  3.5 - 5.2 g/dL  POCT INR     Status: None   Collection Time    10/01/13 12:00 AM      Result Value Range   INR 4.6     Comment: [facility]          Assessment & Plan:

## 2013-10-02 NOTE — Assessment & Plan Note (Signed)
Last Echo October '14 with normal EF but pulmonary hypertension. No evidence of decompensation on today's exam. He did see Dr. Stanford Breed Nov 25th ' 14.  Plan Continue present medications.

## 2013-10-02 NOTE — Assessment & Plan Note (Signed)
Last LDL Jan 2, '15 - 40. Excellent control.  Plan Continue present regimen

## 2013-10-02 NOTE — Assessment & Plan Note (Signed)
BP Readings from Last 3 Encounters:  10/01/13 158/80  08/21/13 160/78  08/06/13 132/66   Mixed control. On 3 drug regimen. No RAS medications due to renal function - but if he remains stable can add ARB

## 2013-10-02 NOTE — Assessment & Plan Note (Signed)
Stable creatinine. [name] does see Dr. Posey Pronto for nephrology - AVF creation has been offered. His renal function has remained stable.   Plan Per Dr. Posey Pronto

## 2013-10-02 NOTE — Assessment & Plan Note (Signed)
Laat A1C 8.3% Jan 2nd.  Plan Continue basal insulin  Add DDP4 - onglyza 2.5 mg daily

## 2013-10-04 ENCOUNTER — Telehealth: Payer: Self-pay | Admitting: Internal Medicine

## 2013-10-04 ENCOUNTER — Telehealth: Payer: Self-pay

## 2013-10-04 MED ORDER — SAXAGLIPTIN HCL 2.5 MG PO TABS: 2.5000 mg | ORAL_TABLET | Freq: Every day | ORAL | Status: DC

## 2013-10-04 NOTE — Telephone Encounter (Signed)
PA for Onglyza 2.5 mg has been approved from 10/04/13 to 09/26/14. Approval has been faxed to pharmacy

## 2013-10-04 NOTE — Telephone Encounter (Signed)
Pt called request Onglyza to be resend to [facility] on [address] instead of [facility] due to insurance will not cover under certain drug store. Please call pt

## 2013-10-04 NOTE — Telephone Encounter (Signed)
Prescription has been sent to [facility] and cancelled with [facility]

## 2013-10-08 ENCOUNTER — Ambulatory Visit (INDEPENDENT_AMBULATORY_CARE_PROVIDER_SITE_OTHER): Payer: Medicare Other | Admitting: Internal Medicine

## 2013-10-08 DIAGNOSIS — I4892 Unspecified atrial flutter: Secondary | ICD-10-CM

## 2013-10-08 DIAGNOSIS — Z7901 Long term (current) use of anticoagulants: Secondary | ICD-10-CM

## 2013-10-08 LAB — POCT INR: INR: 2.8

## 2013-10-15 ENCOUNTER — Ambulatory Visit (INDEPENDENT_AMBULATORY_CARE_PROVIDER_SITE_OTHER): Payer: Medicare Other | Admitting: Internal Medicine

## 2013-10-15 DIAGNOSIS — Z7901 Long term (current) use of anticoagulants: Secondary | ICD-10-CM

## 2013-10-15 DIAGNOSIS — I4892 Unspecified atrial flutter: Secondary | ICD-10-CM

## 2013-10-15 LAB — POCT INR: INR: 2.6

## 2013-10-22 ENCOUNTER — Ambulatory Visit (INDEPENDENT_AMBULATORY_CARE_PROVIDER_SITE_OTHER): Payer: Medicare Other | Admitting: Cardiology

## 2013-10-22 DIAGNOSIS — Z7901 Long term (current) use of anticoagulants: Secondary | ICD-10-CM

## 2013-10-22 DIAGNOSIS — I4892 Unspecified atrial flutter: Secondary | ICD-10-CM

## 2013-10-22 LAB — POCT INR: INR: 1.8

## 2013-10-25 ENCOUNTER — Telehealth: Payer: Self-pay

## 2013-10-25 NOTE — Telephone Encounter (Signed)
Ok to extend home health as requested.

## 2013-10-25 NOTE — Telephone Encounter (Signed)
Phone call from Glenn with [facility] [phone] requesting verbal order to see patient for 4 more weeks. Please advise.

## 2013-10-26 NOTE — Telephone Encounter (Signed)
I spoke to Mead and gave her verbal okay to extend home health as she requested.

## 2013-10-29 ENCOUNTER — Ambulatory Visit (INDEPENDENT_AMBULATORY_CARE_PROVIDER_SITE_OTHER): Payer: Medicare Other | Admitting: Interventional Cardiology

## 2013-10-29 DIAGNOSIS — I4892 Unspecified atrial flutter: Secondary | ICD-10-CM

## 2013-10-29 DIAGNOSIS — Z7901 Long term (current) use of anticoagulants: Secondary | ICD-10-CM

## 2013-10-29 LAB — POCT INR: INR: 2.1

## 2013-11-05 ENCOUNTER — Ambulatory Visit (INDEPENDENT_AMBULATORY_CARE_PROVIDER_SITE_OTHER): Payer: Medicare Other | Admitting: Pharmacist

## 2013-11-05 DIAGNOSIS — I4892 Unspecified atrial flutter: Secondary | ICD-10-CM

## 2013-11-05 DIAGNOSIS — Z7901 Long term (current) use of anticoagulants: Secondary | ICD-10-CM

## 2013-11-05 LAB — POCT INR: INR: 2.2

## 2013-11-12 ENCOUNTER — Ambulatory Visit (INDEPENDENT_AMBULATORY_CARE_PROVIDER_SITE_OTHER): Payer: Medicare Other | Admitting: Interventional Cardiology

## 2013-11-12 DIAGNOSIS — I4892 Unspecified atrial flutter: Secondary | ICD-10-CM

## 2013-11-12 DIAGNOSIS — Z7901 Long term (current) use of anticoagulants: Secondary | ICD-10-CM

## 2013-11-12 LAB — POCT INR: INR: 2.3

## 2013-11-19 ENCOUNTER — Ambulatory Visit (INDEPENDENT_AMBULATORY_CARE_PROVIDER_SITE_OTHER): Payer: Medicare Other | Admitting: Cardiology

## 2013-11-19 DIAGNOSIS — I4892 Unspecified atrial flutter: Secondary | ICD-10-CM

## 2013-11-19 DIAGNOSIS — Z7901 Long term (current) use of anticoagulants: Secondary | ICD-10-CM

## 2013-11-19 LAB — POCT INR: INR: 2

## 2013-11-20 ENCOUNTER — Ambulatory Visit (INDEPENDENT_AMBULATORY_CARE_PROVIDER_SITE_OTHER): Payer: Medicare Other | Admitting: Cardiology

## 2013-11-20 ENCOUNTER — Encounter: Payer: Self-pay | Admitting: Cardiology

## 2013-11-20 VITALS — BP 162/80 | HR 59 | Ht 71.0 in | Wt 190.4 lb

## 2013-11-20 DIAGNOSIS — E785 Hyperlipidemia, unspecified: Secondary | ICD-10-CM

## 2013-11-20 DIAGNOSIS — I2721 Secondary pulmonary arterial hypertension: Secondary | ICD-10-CM

## 2013-11-20 DIAGNOSIS — N185 Chronic kidney disease, stage 5: Secondary | ICD-10-CM

## 2013-11-20 DIAGNOSIS — I4891 Unspecified atrial fibrillation: Secondary | ICD-10-CM

## 2013-11-20 DIAGNOSIS — I4892 Unspecified atrial flutter: Secondary | ICD-10-CM

## 2013-11-20 DIAGNOSIS — I10 Essential (primary) hypertension: Secondary | ICD-10-CM

## 2013-11-20 DIAGNOSIS — I2789 Other specified pulmonary heart diseases: Secondary | ICD-10-CM

## 2013-11-20 DIAGNOSIS — I509 Heart failure, unspecified: Secondary | ICD-10-CM

## 2013-11-20 DIAGNOSIS — I2510 Atherosclerotic heart disease of native coronary artery without angina pectoris: Secondary | ICD-10-CM

## 2013-11-20 DIAGNOSIS — Z8679 Personal history of other diseases of the circulatory system: Secondary | ICD-10-CM

## 2013-11-20 DIAGNOSIS — I5032 Chronic diastolic (congestive) heart failure: Secondary | ICD-10-CM

## 2013-11-20 NOTE — Assessment & Plan Note (Signed)
Continue diuretics. Euvolemic.

## 2013-11-20 NOTE — Assessment & Plan Note (Signed)
Blood pressure controlled. Continue present medications. 

## 2013-11-20 NOTE — Assessment & Plan Note (Signed)
Not on aspirin given need for Coumadin. Continue statin. Followup carotid Dopplers May 2015.

## 2013-11-20 NOTE — Assessment & Plan Note (Signed)
Monitored by nephrology.

## 2013-11-20 NOTE — Assessment & Plan Note (Signed)
He is asymptomatic. Continue Coreg for rate control. Continue Coumadin. Plan rate control and anticoagulation.

## 2013-11-20 NOTE — Progress Notes (Signed)
HPI: FU pulmonary HTN, CAD, diastolic CHF, renal insuff, MGUS, carotid stenosis. He is s/p CABG in 2006. Carotid Dopplers 11/14: 40-97% RICA, 35-32% LICA; fu in 6 months. LHC 3/13: occluded LAD, mid and dist LAD filled from the SVG, apical LAD 99% (unchanged), dCFX occluded prior to OM2, OM2 filled via the SVG, RCA prox 25%, mid 40% and distal 25%, SVG to Dx was occluded. Medical therapy recommended.  Patient also with pulmonary HTN; V/Q scan 10/13 was negative for pulmonary embolism. ANA and HIV were both negative. Bubble study was negative for obvious shunt. RHC due to his significant pulmonary hypertension: PA 60/23, mean 38, PCWP 16, CO 8.75. Hemodynamics did not appear to be consistent with pericardial constriction. High cardiac output was thought to be inaccurate. Etiology of his pulmonary HTN was unclear. PFTs revealed restrictive lung disease. Echocardiogram repeated in October 2014 and showed normal LV function, moderate left atrial enlargement, right atrial and right ventricular enlargement, moderate tricuspid regurgitation and severely elevated pulmonary pressures. Last seen in Nov 2014. Since then, the patient denies any dyspnea on exertion, orthopnea, PND, pedal edema, palpitations, syncope or chest pain.    Current Outpatient Prescriptions  Medication Sig Dispense Refill  . ADVOCATE LANCETS MISC       . Alcohol Swabs (ALCOHOL PREP) 70 % PADS       . amLODipine (NORVASC) 10 MG tablet Take 10 mg by mouth daily.      Marland Kitchen atorvastatin (LIPITOR) 20 MG tablet take 1 tablet by mouth at bedtime  30 tablet  3  . Blood Glucose Calibration (OT ULTRA/FASTTK CNTRL SOLN) SOLN       . carvedilol (COREG) 6.25 MG tablet Take 1 tablet (6.25 mg total) by mouth 2 (two) times daily.  180 tablet  3  . furosemide (LASIX) 80 MG tablet Take 3 tablets (240 mg total) by mouth daily. After 3 days reduce dose to 80 mg daily  180 tablet  3  . insulin detemir (LEVEMIR) 100 UNIT/ML injection Inject 0.15 mLs (15  Units total) into the skin at bedtime.  10 mL  2  . Lancet Devices (ADVOCATE RAPID-SAFE LANCING) MISC       . ONE TOUCH ULTRA TEST test strip       . potassium chloride SA (K-DUR,KLOR-CON) 20 MEQ tablet Take 1 tablet (20 mEq total) by mouth 3 (three) times daily.  90 tablet  3  . saxagliptin HCl (ONGLYZA) 2.5 MG TABS tablet Take 1 tablet (2.5 mg total) by mouth daily.  30 tablet  11  . Vitamin D, Ergocalciferol, (DRISDOL) 50000 UNITS CAPS capsule Take 50,000 Units by mouth every 7 (seven) days.      Marland Kitchen warfarin (COUMADIN) 6 MG tablet Take 1 tablet (6 mg total) by mouth as directed.  40 tablet  3   No current facility-administered medications for this visit.     Past Medical History  Diagnosis Date  . CAD (coronary artery disease)     s/p 3v CABG 2006, myoview 04/2010 EF 49%, prior inferior/apical infarct, no ischemia, LHC 11/2011 stable anatomy (occluded LAD filled from vein graft, distal LCx occluded prior to OM2, OM2 filled from vein graft, RCA prox 25%, mid 40%, distal 25% lesions, SVG to diagonal occluded) Med Rx  . Chronic diastolic heart failure     hx of cardiorenal syndrome;  Echo 06/2012 EF 99-24%, mod diastolic dysfunction, mild MR, mod TR, mild R/LAE, mild RV dilatation, PASP 57mmHg.  . Carotid stenosis  a. Carotid dopplers R 40-59%, Left 60-79%;   b. carotids 12/13:  6-04% RICA, 54-09% LICA (rpt in 6 mos);  c.  Carotid US (11/14):  R 40-59%; L 60-79%, R vertebral occluded, L vertebral antegrade  . HLD (hyperlipidemia)   . Mediastinal adenopathy     per CT chest in 2006 with PET scan showing very limited metabolic activity; not felt to have a significant neoplastic potential  . Iron deficiency anemia   . Hypertension   . Diabetes mellitus     insulin dependent   . GERD (gastroesophageal reflux disease)   . AV block, Mobitz 1     Noted 11/2011 in hospital, BB stopped  . Colon polyps   . Diverticulosis   . Pulmonary arterial hypertension     a.  RHC 11/1 PASP 60 mmHg (mean  38); b. RHC 11/15 PA pressure 66/24, PCWP 26 and CO 6.2 => Sildenafil started;   c. Echo bubble study 11/13:  no obvious shunt;   d. VQ scan neg for pulmonary embolism  . CKD (chronic kidney disease) stage 5, GFR less than 15 ml/min     hx of cardiorenal syndrome;  Dr Posey Pronto;  AVF recommended and scheduled (patient hesitant to proceed);   Renal US 11/13: diff echogenic kidneys c/w medical renal disease; no hydronephrosis or renal mass  . Hyperparathyroidism, secondary renal   . MGUS (monoclonal gammopathy of unknown significance)     a. Kappa/Lambda free light chain ratio 11/13:  23.99;  b. Met. Bone survey 11/13:  no osteolytic lesions to suggest MMyeloma  . Atrial flutter     diagnosed 05/2013; Coumadin initiated    Past Surgical History  Procedure Laterality Date  . Appendectomy    . Coronary artery bypass graft  2006    SVG to OM2, SVG to LAD, SVG to DX; (the LIMA did not have good flow and therefore was not used)  . Cardiac catheterization  12/21/2011    History   Social History  . Marital Status: Widowed    Spouse Name: N/A    Number of Children: 58  . Years of Education: N/A   Occupational History  . Retired    Social History Main Topics  . Smoking status: Former Smoker    Quit date: 09/27/1953  . Smokeless tobacco: Never Used  . Alcohol Use: No  . Drug Use: No  . Sexual Activity: No   Other Topics Concern  . Not on file   Social History Narrative   10th grade. Married '64-10 yrs/widowed; married '77 - 109yrs/widowed. 7 sons, 5 daughters; 30+ grandchildren, 15 great-grands. 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 Lives alone, outdoor cat. His son checks on him all the time.     ROS: no fevers or chills, productive cough, hemoptysis, dysphasia, odynophagia, melena, hematochezia, dysuria, hematuria, rash, seizure activity, orthopnea, PND, pedal edema, claudication. Remaining systems are negative.  Physical Exam: Well-developed well-nourished in  no acute distress.  Skin is warm and dry.  HEENT is normal.  Neck is supple.  Chest is clear to auscultation with normal expansion.  Cardiovascular exam is irregular Abdominal exam nontender or distended. No masses palpated. Extremities show trace edema. neuro grossly intact  ECG atrial fibrillation at a rate of 59. Left anterior fascicular block. Lateral T-wave inversion.

## 2013-11-20 NOTE — Assessment & Plan Note (Signed)
Continue present dose of Lasix. 

## 2013-11-20 NOTE — Assessment & Plan Note (Signed)
Continue statin. 

## 2013-11-20 NOTE — Assessment & Plan Note (Signed)
Not on aspirin given need for Coumadin. Continue statin.  

## 2013-11-20 NOTE — Patient Instructions (Signed)
Your physician wants you to follow-up in: 6 MONTHS WITH DR CRENSHAW You will receive a reminder letter in the mail two months in advance. If you don't receive a letter, please call our office to schedule the follow-up appointment.  

## 2013-11-26 ENCOUNTER — Ambulatory Visit (INDEPENDENT_AMBULATORY_CARE_PROVIDER_SITE_OTHER): Payer: Medicare Other | Admitting: Internal Medicine

## 2013-11-26 DIAGNOSIS — I4892 Unspecified atrial flutter: Secondary | ICD-10-CM

## 2013-11-26 DIAGNOSIS — Z7901 Long term (current) use of anticoagulants: Secondary | ICD-10-CM

## 2013-11-26 LAB — POCT INR: INR: 2.1

## 2013-12-13 IMAGING — CR DG CHEST 2V
2 series · 2 of 2 positions shown · non-contrast
Comparison: 08/06/2012

CLINICAL DATA: Shortness of breath with pleural effusion.

CHEST - 2 VIEW

[w chest pa]
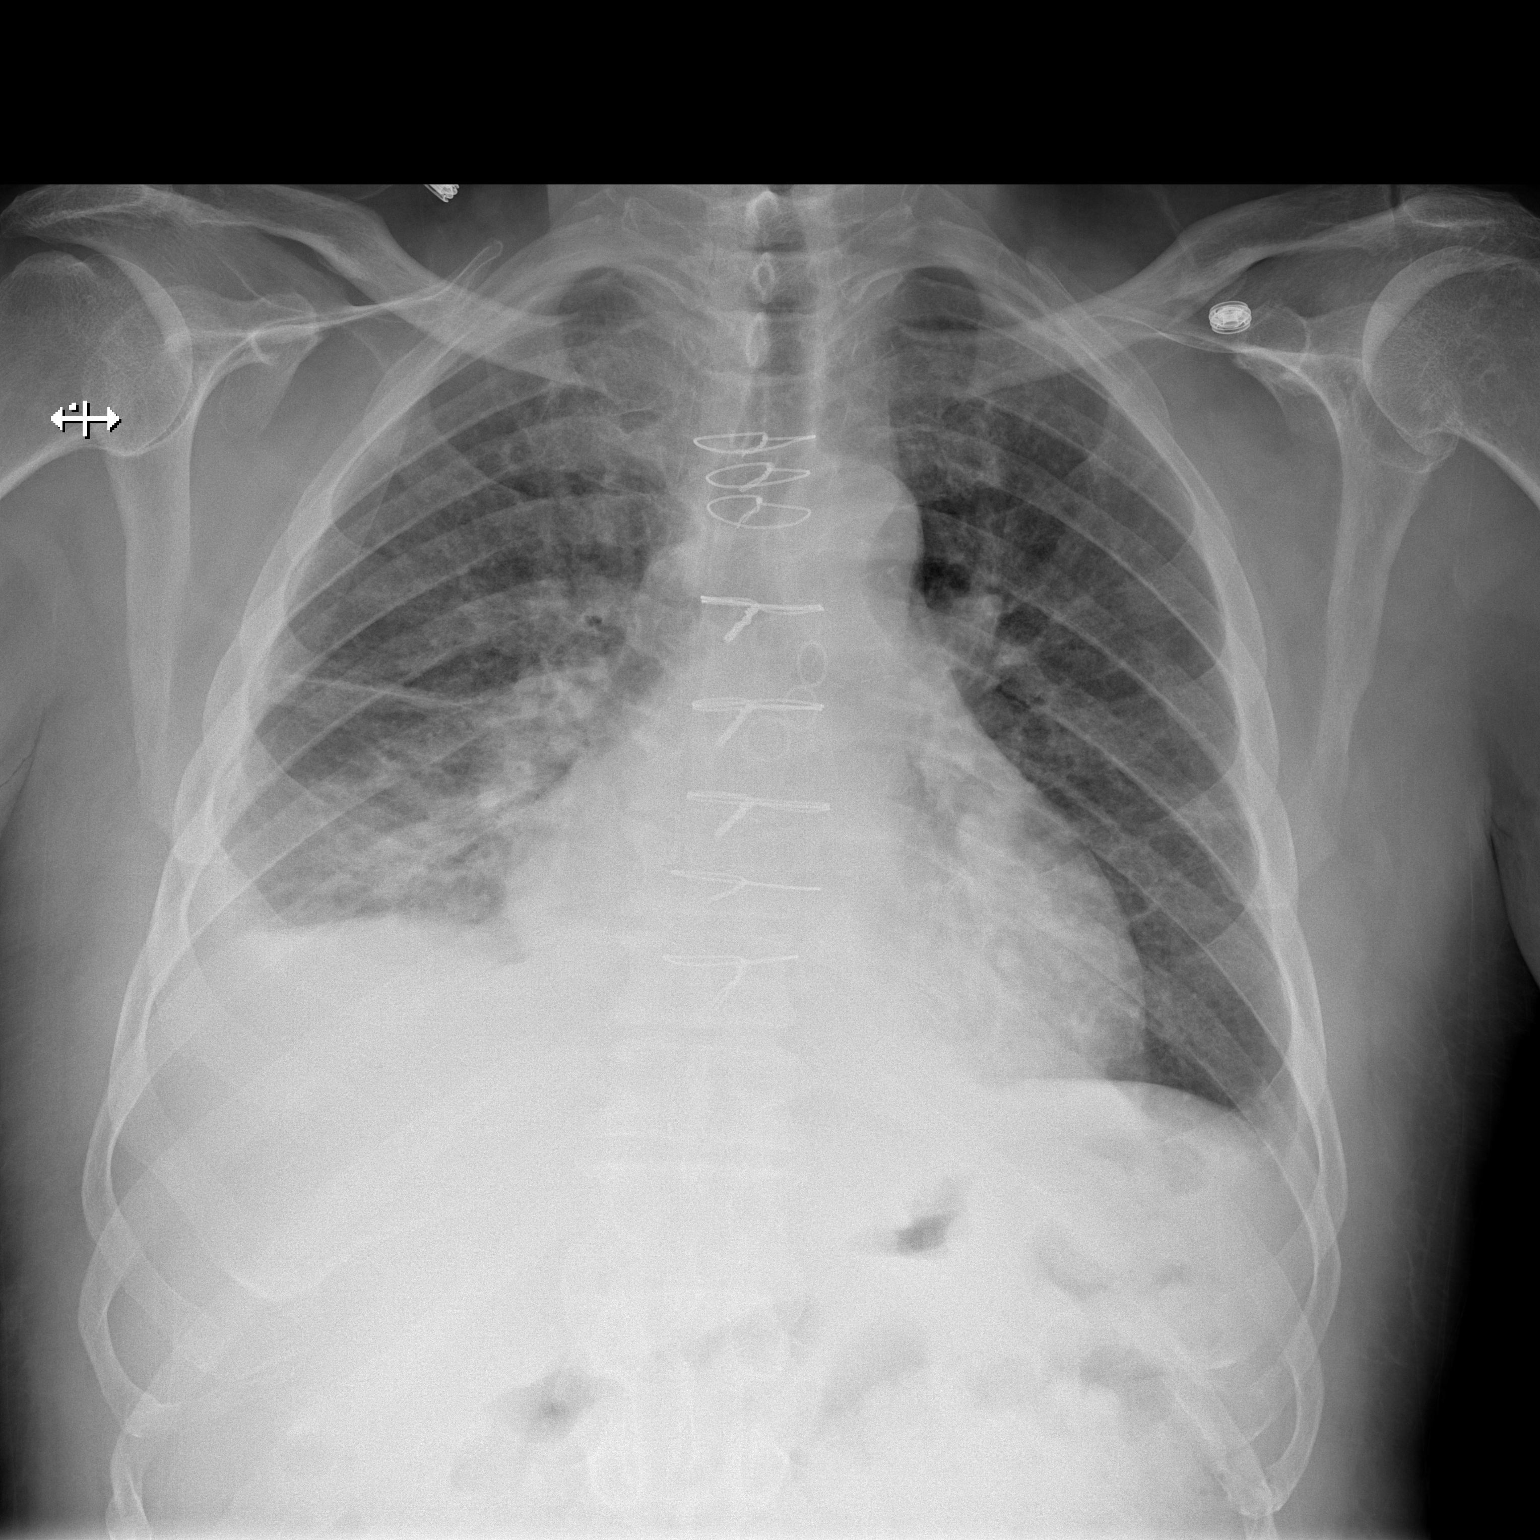

[w chest lat]
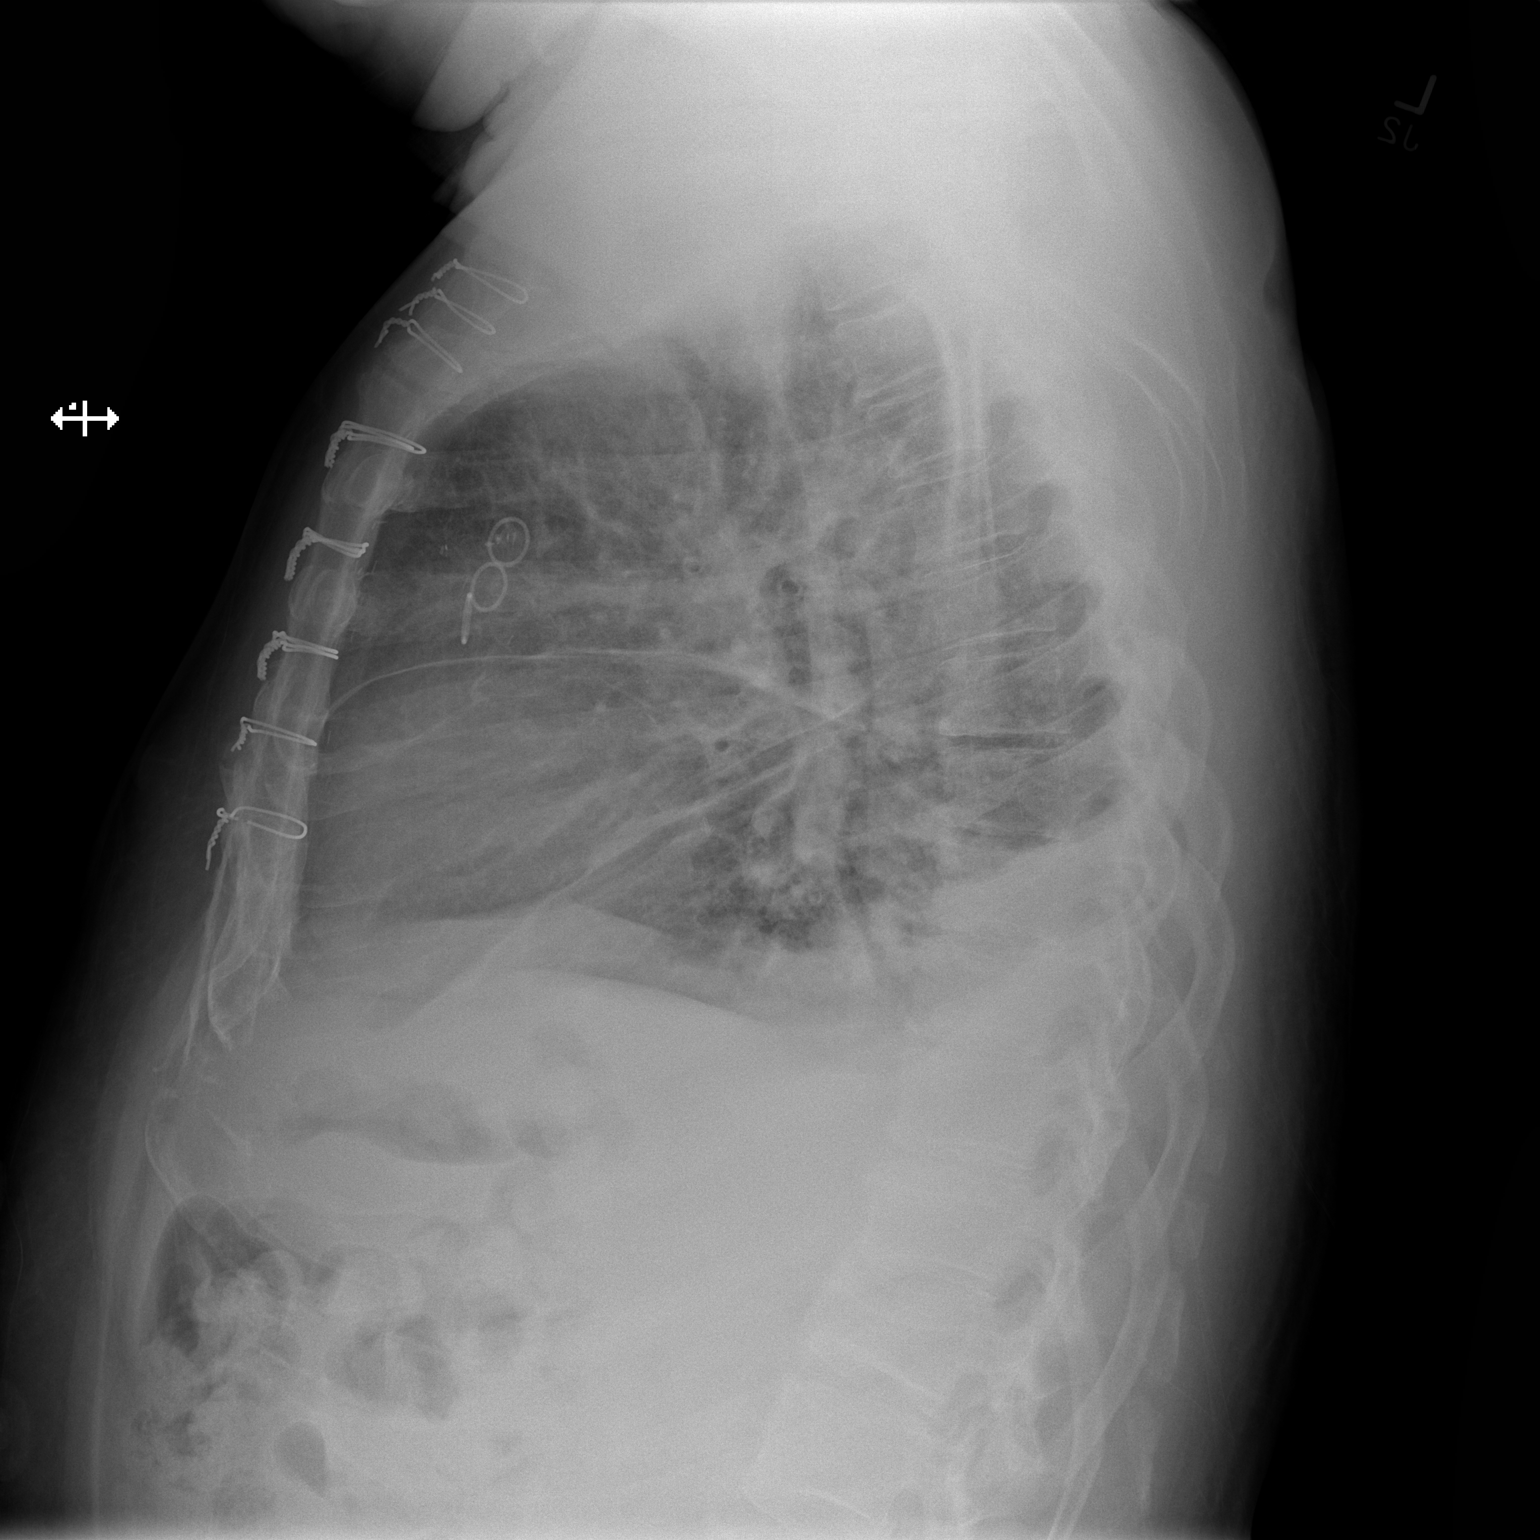

[2 of 2 positions shown; findings below may reference images not displayed]

FINDINGS: Stable asymmetric elevation of the right hemidiaphragm.
Persistent vascular congestion with probable interstitial pulmonary
edema. As before, there is bibasilar atelectasis or infiltrate,
right greater than left.  There are small bilateral pleural
effusions.
IMPRESSION: Stable.  Vascular congestion with interstitial edema.

Bibasilar atelectasis/infiltrate with small bilateral pleural
effusions.

## 2013-12-14 IMAGING — CR DG BONE SURVEY MET
1 series · 1 of 1 positions shown · non-contrast
Comparison: Chest radiograph, 08/07/2012

CLINICAL DATA: Positive capital light chain disease.  Rule out
lytic multiple myeloma lesions.

METASTATIC BONE SURVEY

[w chest pa]
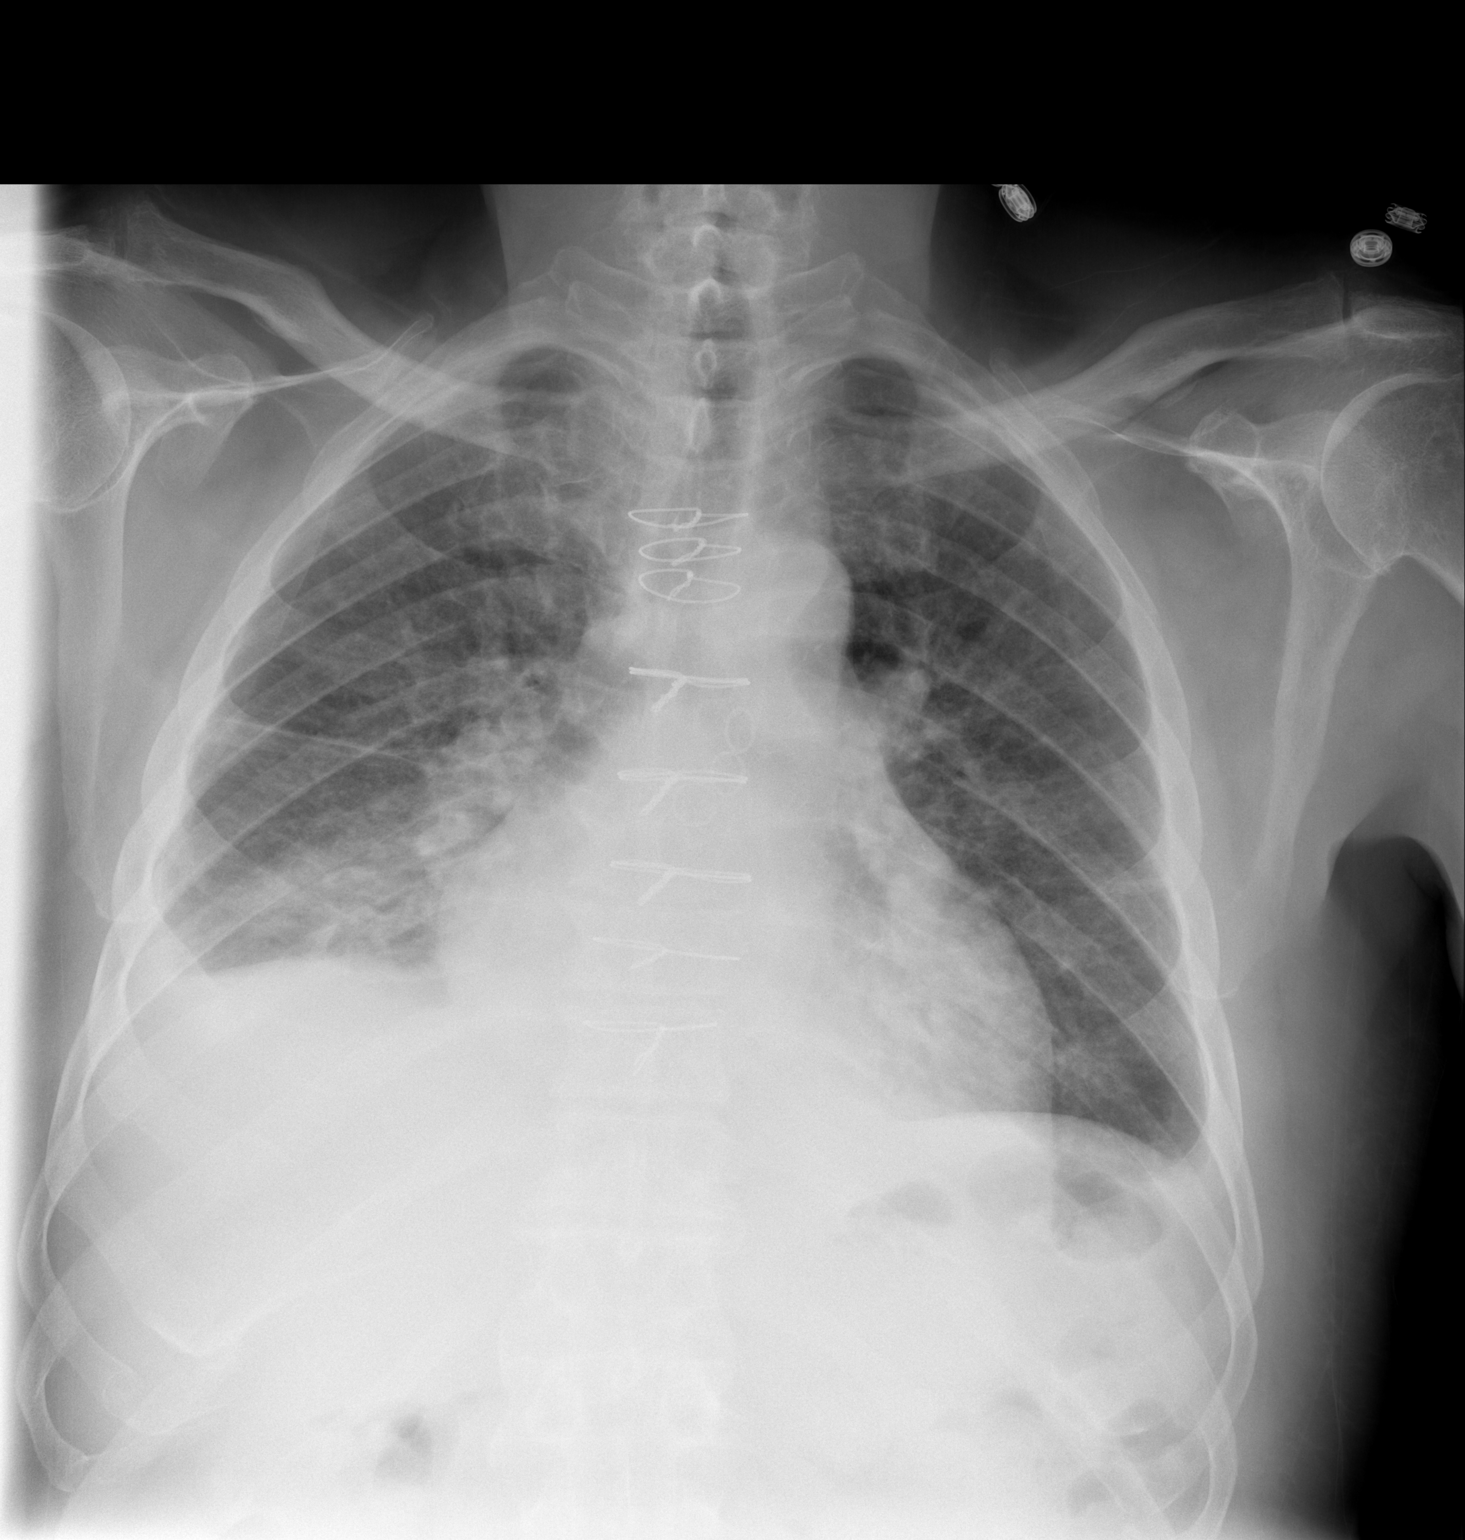

[1 of 1 positions shown; findings below may reference images not displayed]

FINDINGS: Imaging included and lateral images fall, imaging of the
spine, imaging of the chest and pelvis and imaging of the humeri
and femurs.

There are no lytic lesions to suggest multiple myeloma.

The chest radiograph shows hazy opacity at the right base with less
opacity at the medial left lung base unchanged from the previous
day's study.

There are degenerative changes throughout the visualized spine.
There is a mild compression fracture of L1 that is likely old.
There are degenerative changes with chondrocalcinosis of both
knees.  There are degenerative changes of both AC joints and of the
left glenohumeral joint.

The bones are diffusely demineralized.
IMPRESSION: No osteolytic lesions to suggest multiple myeloma.  No evidence of
neoplastic disease to bone.

## 2013-12-25 ENCOUNTER — Telehealth: Payer: Self-pay

## 2013-12-25 NOTE — Telephone Encounter (Signed)
I called [phone] and spoke to patients son letting him know patient's insulin dose he's to inject 0.15 mls (15 units) into skin at bedtime.

## 2013-12-25 NOTE — Telephone Encounter (Signed)
The patient called and is confused about his insulin medication.  He is aware Dr.Norins is retired, however, has not established with another doctor.  He states he is unwilling to go to a different office to be seen.  However, he is currently having trouble with his insulin medication and knowing which dosage of the medication to take.   Pt callback [phone]

## 2014-01-20 ENCOUNTER — Other Ambulatory Visit: Payer: Self-pay | Admitting: Cardiology

## 2014-01-22 ENCOUNTER — Ambulatory Visit (INDEPENDENT_AMBULATORY_CARE_PROVIDER_SITE_OTHER): Payer: Medicare Other | Admitting: General Practice

## 2014-01-22 DIAGNOSIS — Z7901 Long term (current) use of anticoagulants: Secondary | ICD-10-CM

## 2014-01-22 DIAGNOSIS — I4892 Unspecified atrial flutter: Secondary | ICD-10-CM

## 2014-01-22 LAB — POCT INR: INR: 3.3

## 2014-01-22 NOTE — Progress Notes (Signed)
Pre visit review using our clinic review tool, if applicable. No additional management support is needed unless otherwise documented below in the visit note. 

## 2014-01-28 ENCOUNTER — Other Ambulatory Visit: Payer: Self-pay | Admitting: *Deleted

## 2014-01-28 MED ORDER — WARFARIN SODIUM 6 MG PO TABS: 6.0000 mg | ORAL_TABLET | ORAL | Status: DC

## 2014-02-06 ENCOUNTER — Other Ambulatory Visit (HOSPITAL_COMMUNITY): Payer: Self-pay

## 2014-02-07 ENCOUNTER — Encounter (HOSPITAL_COMMUNITY)
Admission: RE | Admit: 2014-02-07 | Discharge: 2014-02-07 | Disposition: A | Discharge status: Home / Self Care | Payer: Medicare Other | Source: Ambulatory Visit | Attending: Nephrology | Admitting: Nephrology

## 2014-02-07 DIAGNOSIS — N184 Chronic kidney disease, stage 4 (severe): Secondary | ICD-10-CM | POA: Diagnosis not present

## 2014-02-07 DIAGNOSIS — D638 Anemia in other chronic diseases classified elsewhere: Secondary | ICD-10-CM | POA: Insufficient documentation

## 2014-02-07 MED ORDER — SODIUM CHLORIDE 0.9 % IV SOLN
1020.0000 mg | Freq: Once | INTRAVENOUS | Status: AC
  Administered 2014-02-07: 1020 mg via INTRAVENOUS
  Filled 2014-02-07: qty 34

## 2014-02-14 ENCOUNTER — Encounter (HOSPITAL_COMMUNITY)
Admission: RE | Admit: 2014-02-14 | Discharge: 2014-02-14 | Disposition: A | Discharge status: Home / Self Care | Payer: Medicare Other | Source: Ambulatory Visit | Attending: Nephrology | Admitting: Nephrology

## 2014-02-14 DIAGNOSIS — D638 Anemia in other chronic diseases classified elsewhere: Secondary | ICD-10-CM | POA: Diagnosis not present

## 2014-02-14 LAB — POCT HEMOGLOBIN-HEMACUE: Hemoglobin: 9.7 g/dL — ABNORMAL LOW (ref 13.0–17.0)

## 2014-02-14 MED ORDER — EPOETIN ALFA 10000 UNIT/ML IJ SOLN
10000.0000 [IU] | INTRAMUSCULAR | Status: DC
  Administered 2014-02-14: 10000 [IU] via SUBCUTANEOUS

## 2014-02-14 MED ORDER — EPOETIN ALFA 10000 UNIT/ML IJ SOLN
INTRAMUSCULAR | Status: AC
  Administered 2014-02-14: 10000 [IU] via SUBCUTANEOUS
  Filled 2014-02-14: qty 1

## 2014-02-20 ENCOUNTER — Other Ambulatory Visit (HOSPITAL_COMMUNITY): Payer: Self-pay | Admitting: *Deleted

## 2014-02-20 DIAGNOSIS — I6529 Occlusion and stenosis of unspecified carotid artery: Secondary | ICD-10-CM

## 2014-02-21 ENCOUNTER — Encounter (HOSPITAL_COMMUNITY)
Admission: RE | Admit: 2014-02-21 | Discharge: 2014-02-21 | Disposition: A | Discharge status: Home / Self Care | Payer: Medicare Other | Source: Ambulatory Visit | Attending: Nephrology | Admitting: Nephrology

## 2014-02-21 DIAGNOSIS — D638 Anemia in other chronic diseases classified elsewhere: Secondary | ICD-10-CM | POA: Diagnosis not present

## 2014-02-21 LAB — POCT HEMOGLOBIN-HEMACUE: Hemoglobin: 9.7 g/dL — ABNORMAL LOW (ref 13.0–17.0)

## 2014-02-21 MED ORDER — EPOETIN ALFA 10000 UNIT/ML IJ SOLN
INTRAMUSCULAR | Status: AC
  Filled 2014-02-21: qty 1

## 2014-02-21 MED ORDER — EPOETIN ALFA 10000 UNIT/ML IJ SOLN
10000.0000 [IU] | INTRAMUSCULAR | Status: DC
  Administered 2014-02-21: 10000 [IU] via SUBCUTANEOUS

## 2014-02-26 ENCOUNTER — Ambulatory Visit (HOSPITAL_COMMUNITY): Payer: Medicare HMO | Attending: Cardiology | Admitting: *Deleted

## 2014-02-26 DIAGNOSIS — I6529 Occlusion and stenosis of unspecified carotid artery: Secondary | ICD-10-CM | POA: Insufficient documentation

## 2014-02-26 NOTE — Progress Notes (Signed)
Carotid duplex complete 

## 2014-02-27 ENCOUNTER — Encounter (HOSPITAL_COMMUNITY)
Admission: RE | Admit: 2014-02-27 | Discharge: 2014-02-27 | Disposition: A | Discharge status: Home / Self Care | Payer: Medicare Other | Source: Ambulatory Visit | Attending: Nephrology | Admitting: Nephrology

## 2014-02-27 DIAGNOSIS — N185 Chronic kidney disease, stage 5: Secondary | ICD-10-CM | POA: Insufficient documentation

## 2014-02-27 DIAGNOSIS — D509 Iron deficiency anemia, unspecified: Secondary | ICD-10-CM | POA: Insufficient documentation

## 2014-02-27 LAB — RENAL FUNCTION PANEL
Albumin: 3.8 g/dL (ref 3.5–5.2)
BUN: 31 mg/dL — ABNORMAL HIGH (ref 6–23)
CO2: 22 meq/L (ref 19–32)
Calcium: 9.7 mg/dL (ref 8.4–10.5)
Chloride: 101 mEq/L (ref 96–112)
Creatinine, Ser: 2.48 mg/dL — ABNORMAL HIGH (ref 0.50–1.35)
GFR calc Af Amer: 26 mL/min — ABNORMAL LOW (ref >90)
GFR, EST NON AFRICAN AMERICAN: 23 mL/min — AB (ref >90)
GLUCOSE: 120 mg/dL — AB (ref 70–99)
Phosphorus: 4.2 mg/dL (ref 2.3–4.6)
Potassium: 3.9 mEq/L (ref 3.7–5.3)
Sodium: 137 mEq/L (ref 137–147)

## 2014-02-27 LAB — IRON AND TIBC
IRON: 39 ug/dL — AB (ref 42–135)
Saturation Ratios: 17 % — ABNORMAL LOW (ref 20–55)
TIBC: 226 ug/dL (ref 215–435)
UIBC: 187 ug/dL (ref 125–400)

## 2014-02-27 LAB — FERRITIN: Ferritin: 897 ng/mL — ABNORMAL HIGH (ref 22–322)

## 2014-02-27 LAB — POCT HEMOGLOBIN-HEMACUE: Hemoglobin: 10.8 g/dL — ABNORMAL LOW (ref 13.0–17.0)

## 2014-02-27 MED ORDER — EPOETIN ALFA 10000 UNIT/ML IJ SOLN
10000.0000 [IU] | INTRAMUSCULAR | Status: DC
  Administered 2014-02-27: 10000 [IU] via SUBCUTANEOUS

## 2014-02-27 MED ORDER — EPOETIN ALFA 10000 UNIT/ML IJ SOLN
INTRAMUSCULAR | Status: AC
  Filled 2014-02-27: qty 1

## 2014-03-06 ENCOUNTER — Encounter (HOSPITAL_COMMUNITY)
Admission: RE | Admit: 2014-03-06 | Discharge: 2014-03-06 | Disposition: A | Discharge status: Home / Self Care | Payer: Medicare Other | Source: Ambulatory Visit | Attending: Nephrology | Admitting: Nephrology

## 2014-03-06 DIAGNOSIS — N185 Chronic kidney disease, stage 5: Secondary | ICD-10-CM | POA: Diagnosis not present

## 2014-03-06 LAB — POCT HEMOGLOBIN-HEMACUE: HEMOGLOBIN: 10.7 g/dL — AB (ref 13.0–17.0)

## 2014-03-06 MED ORDER — EPOETIN ALFA 10000 UNIT/ML IJ SOLN
INTRAMUSCULAR | Status: AC
  Administered 2014-03-06: 10000 [IU] via SUBCUTANEOUS
  Filled 2014-03-06: qty 1

## 2014-03-06 MED ORDER — EPOETIN ALFA 10000 UNIT/ML IJ SOLN: 10000.0000 [IU] | INTRAMUSCULAR | Status: DC

## 2014-03-13 ENCOUNTER — Encounter (HOSPITAL_COMMUNITY)
Admission: RE | Admit: 2014-03-13 | Discharge: 2014-03-13 | Disposition: A | Discharge status: Home / Self Care | Payer: Medicare Other | Source: Ambulatory Visit | Attending: Nephrology | Admitting: Nephrology

## 2014-03-13 DIAGNOSIS — N185 Chronic kidney disease, stage 5: Secondary | ICD-10-CM | POA: Diagnosis not present

## 2014-03-13 LAB — POCT HEMOGLOBIN-HEMACUE: Hemoglobin: 10.8 g/dL — ABNORMAL LOW (ref 13.0–17.0)

## 2014-03-13 MED ORDER — EPOETIN ALFA 10000 UNIT/ML IJ SOLN
INTRAMUSCULAR | Status: AC
  Administered 2014-03-13: 10000 [IU] via SUBCUTANEOUS
  Filled 2014-03-13: qty 1

## 2014-03-13 MED ORDER — EPOETIN ALFA 10000 UNIT/ML IJ SOLN: 10000.0000 [IU] | INTRAMUSCULAR | Status: DC

## 2014-03-14 ENCOUNTER — Telehealth: Payer: Self-pay | Admitting: Cardiology

## 2014-03-14 NOTE — Telephone Encounter (Signed)
Walk in Pt Form " Jury Summons" Form Filled dropped off gave to [facility] 6.18.15/km

## 2014-03-18 ENCOUNTER — Encounter: Payer: Self-pay | Admitting: *Deleted

## 2014-03-18 ENCOUNTER — Telehealth: Payer: Self-pay | Admitting: *Deleted

## 2014-03-18 NOTE — Telephone Encounter (Signed)
Spoke with pt, aware jury duty letter generated and mailed to the jury office at his request.

## 2014-03-20 ENCOUNTER — Encounter (HOSPITAL_COMMUNITY)
Admission: RE | Admit: 2014-03-20 | Discharge: 2014-03-20 | Disposition: A | Discharge status: Home / Self Care | Payer: Medicare HMO | Source: Ambulatory Visit | Attending: Nephrology | Admitting: Nephrology

## 2014-03-20 DIAGNOSIS — D638 Anemia in other chronic diseases classified elsewhere: Secondary | ICD-10-CM | POA: Diagnosis present

## 2014-03-20 DIAGNOSIS — N184 Chronic kidney disease, stage 4 (severe): Secondary | ICD-10-CM | POA: Diagnosis not present

## 2014-03-20 LAB — POCT HEMOGLOBIN-HEMACUE: Hemoglobin: 8.2 g/dL — ABNORMAL LOW (ref 13.0–17.0)

## 2014-03-20 MED ORDER — EPOETIN ALFA 10000 UNIT/ML IJ SOLN
10000.0000 [IU] | INTRAMUSCULAR | Status: DC
  Administered 2014-03-20: 10000 [IU] via SUBCUTANEOUS

## 2014-03-20 MED ORDER — EPOETIN ALFA 10000 UNIT/ML IJ SOLN
INTRAMUSCULAR | Status: AC
  Filled 2014-03-20: qty 1

## 2014-03-27 ENCOUNTER — Encounter (HOSPITAL_COMMUNITY): Payer: Medicare HMO

## 2014-04-02 ENCOUNTER — Encounter (HOSPITAL_COMMUNITY)
Admission: RE | Admit: 2014-04-02 | Discharge: 2014-04-02 | Disposition: A | Discharge status: Home / Self Care | Payer: Medicare HMO | Source: Ambulatory Visit | Attending: Nephrology | Admitting: Nephrology

## 2014-04-02 DIAGNOSIS — D509 Iron deficiency anemia, unspecified: Secondary | ICD-10-CM | POA: Diagnosis not present

## 2014-04-02 DIAGNOSIS — N185 Chronic kidney disease, stage 5: Secondary | ICD-10-CM | POA: Diagnosis present

## 2014-04-02 LAB — IRON AND TIBC
Iron: 36 ug/dL — ABNORMAL LOW (ref 42–135)
Saturation Ratios: 16 % — ABNORMAL LOW (ref 20–55)
TIBC: 222 ug/dL (ref 215–435)
UIBC: 186 ug/dL (ref 125–400)

## 2014-04-02 LAB — RENAL FUNCTION PANEL
ANION GAP: 15 (ref 5–15)
Albumin: 3.8 g/dL (ref 3.5–5.2)
BUN: 40 mg/dL — ABNORMAL HIGH (ref 6–23)
CALCIUM: 9.7 mg/dL (ref 8.4–10.5)
CO2: 22 meq/L (ref 19–32)
Chloride: 104 mEq/L (ref 96–112)
Creatinine, Ser: 2.53 mg/dL — ABNORMAL HIGH (ref 0.50–1.35)
GFR calc Af Amer: 26 mL/min — ABNORMAL LOW (ref >90)
GFR, EST NON AFRICAN AMERICAN: 22 mL/min — AB (ref >90)
Glucose, Bld: 71 mg/dL (ref 70–99)
POTASSIUM: 4.1 meq/L (ref 3.7–5.3)
Phosphorus: 4.5 mg/dL (ref 2.3–4.6)
SODIUM: 141 meq/L (ref 137–147)

## 2014-04-02 LAB — POCT HEMOGLOBIN-HEMACUE: HEMOGLOBIN: 11.7 g/dL — AB (ref 13.0–17.0)

## 2014-04-02 LAB — FERRITIN: Ferritin: 510 ng/mL — ABNORMAL HIGH (ref 22–322)

## 2014-04-02 MED ORDER — EPOETIN ALFA 10000 UNIT/ML IJ SOLN
10000.0000 [IU] | INTRAMUSCULAR | Status: DC
  Administered 2014-04-02: 10000 [IU] via SUBCUTANEOUS

## 2014-04-02 MED ORDER — EPOETIN ALFA 10000 UNIT/ML IJ SOLN
INTRAMUSCULAR | Status: AC
  Filled 2014-04-02: qty 1

## 2014-04-05 NOTE — Telephone Encounter (Signed)
Close encounter 

## 2014-04-09 ENCOUNTER — Encounter (HOSPITAL_COMMUNITY): Payer: Medicare HMO

## 2014-04-10 ENCOUNTER — Encounter (HOSPITAL_COMMUNITY)
Admission: RE | Admit: 2014-04-10 | Discharge: 2014-04-10 | Disposition: A | Discharge status: Home / Self Care | Payer: Medicare HMO | Source: Ambulatory Visit | Attending: Nephrology | Admitting: Nephrology

## 2014-04-10 DIAGNOSIS — N185 Chronic kidney disease, stage 5: Secondary | ICD-10-CM | POA: Diagnosis not present

## 2014-04-10 LAB — POCT HEMOGLOBIN-HEMACUE: HEMOGLOBIN: 11.5 g/dL — AB (ref 13.0–17.0)

## 2014-04-10 MED ORDER — EPOETIN ALFA 10000 UNIT/ML IJ SOLN
INTRAMUSCULAR | Status: AC
  Filled 2014-04-10: qty 1

## 2014-04-10 MED ORDER — EPOETIN ALFA 10000 UNIT/ML IJ SOLN: 10000.0000 [IU] | INTRAMUSCULAR | Status: DC

## 2014-04-11 NOTE — Telephone Encounter (Signed)
Close encounter 

## 2014-04-17 ENCOUNTER — Encounter (HOSPITAL_COMMUNITY): Payer: Medicare HMO

## 2014-04-17 ENCOUNTER — Telehealth: Payer: Self-pay | Admitting: Internal Medicine

## 2014-04-17 NOTE — Telephone Encounter (Signed)
Completed PA for onglyza on cover-[employer] waiting on approval status...[name]

## 2014-04-17 NOTE — Telephone Encounter (Signed)
Pt call that that [facility] is going to be send over PA for onglyza. Advise pt to call [facility] and get them to fax over the PA request.

## 2014-04-24 ENCOUNTER — Encounter (HOSPITAL_COMMUNITY): Payer: Medicare HMO

## 2014-04-25 NOTE — Telephone Encounter (Signed)
Called [name] to verfify PA status. Spoke with [name]/rep PA was approved on 04/17/14 and pt has already pick up script...[name]

## 2014-05-01 ENCOUNTER — Encounter (HOSPITAL_COMMUNITY)
Admission: RE | Admit: 2014-05-01 | Discharge: 2014-05-01 | Disposition: A | Discharge status: Home / Self Care | Payer: Medicare HMO | Source: Ambulatory Visit | Attending: Nephrology | Admitting: Nephrology

## 2014-05-01 DIAGNOSIS — N185 Chronic kidney disease, stage 5: Secondary | ICD-10-CM | POA: Diagnosis present

## 2014-05-01 DIAGNOSIS — D509 Iron deficiency anemia, unspecified: Secondary | ICD-10-CM | POA: Insufficient documentation

## 2014-05-01 LAB — IRON AND TIBC
Iron: 53 ug/dL (ref 42–135)
Saturation Ratios: 24 % (ref 20–55)
TIBC: 220 ug/dL (ref 215–435)
UIBC: 167 ug/dL (ref 125–400)

## 2014-05-01 LAB — RENAL FUNCTION PANEL
ALBUMIN: 3.6 g/dL (ref 3.5–5.2)
ANION GAP: 13 (ref 5–15)
BUN: 39 mg/dL — ABNORMAL HIGH (ref 6–23)
CO2: 22 mEq/L (ref 19–32)
Calcium: 9.3 mg/dL (ref 8.4–10.5)
Chloride: 105 mEq/L (ref 96–112)
Creatinine, Ser: 2.77 mg/dL — ABNORMAL HIGH (ref 0.50–1.35)
GFR calc Af Amer: 23 mL/min — ABNORMAL LOW (ref >90)
GFR calc non Af Amer: 20 mL/min — ABNORMAL LOW (ref >90)
Glucose, Bld: 118 mg/dL — ABNORMAL HIGH (ref 70–99)
PHOSPHORUS: 4.4 mg/dL (ref 2.3–4.6)
POTASSIUM: 4.4 meq/L (ref 3.7–5.3)
SODIUM: 140 meq/L (ref 137–147)

## 2014-05-01 LAB — POCT HEMOGLOBIN-HEMACUE: HEMOGLOBIN: 10.8 g/dL — AB (ref 13.0–17.0)

## 2014-05-01 LAB — FERRITIN: Ferritin: 625 ng/mL — ABNORMAL HIGH (ref 22–322)

## 2014-05-01 MED ORDER — EPOETIN ALFA 10000 UNIT/ML IJ SOLN
INTRAMUSCULAR | Status: AC
  Administered 2014-05-01: 10000 [IU] via SUBCUTANEOUS
  Filled 2014-05-01: qty 1

## 2014-05-01 MED ORDER — EPOETIN ALFA 10000 UNIT/ML IJ SOLN
10000.0000 [IU] | INTRAMUSCULAR | Status: DC
  Administered 2014-05-01: 10000 [IU] via SUBCUTANEOUS

## 2014-05-02 ENCOUNTER — Encounter (HOSPITAL_COMMUNITY): Payer: Medicare HMO

## 2014-05-03 ENCOUNTER — Telehealth: Payer: Self-pay | Admitting: Internal Medicine

## 2014-05-03 DIAGNOSIS — E119 Type 2 diabetes mellitus without complications: Secondary | ICD-10-CM

## 2014-05-03 MED ORDER — INSULIN PEN NEEDLE 30G X 8 MM MISC: Status: DC

## 2014-05-03 MED ORDER — INSULIN PEN NEEDLE 30G X 8 MM MISC: 1.0000 | Status: DC | PRN

## 2014-05-03 NOTE — Telephone Encounter (Signed)
Pt request insulin needle refill to be send in to [facility] on [address]. Pt is out of this, please send it in ASAP. Pt schedule with Dr. Doug Sou 07/22/14.

## 2014-05-03 NOTE — Telephone Encounter (Signed)
Needles called in on pharmacy voicemail since it won't send electronically

## 2014-05-03 NOTE — Telephone Encounter (Signed)
Can have 30 needles but needs A1c &. Last A1c 8.3 %

## 2014-05-06 ENCOUNTER — Other Ambulatory Visit: Payer: Self-pay | Admitting: *Deleted

## 2014-05-06 MED ORDER — INSULIN DETEMIR 100 UNIT/ML ~~LOC~~ SOLN: 15.0000 [IU] | Freq: Every day | SUBCUTANEOUS | Status: DC

## 2014-05-08 ENCOUNTER — Encounter (HOSPITAL_COMMUNITY)
Admission: RE | Admit: 2014-05-08 | Discharge: 2014-05-08 | Disposition: A | Discharge status: Home / Self Care | Payer: Medicare HMO | Source: Ambulatory Visit | Attending: Nephrology | Admitting: Nephrology

## 2014-05-08 DIAGNOSIS — N185 Chronic kidney disease, stage 5: Secondary | ICD-10-CM | POA: Diagnosis not present

## 2014-05-08 LAB — POCT HEMOGLOBIN-HEMACUE: HEMOGLOBIN: 11.2 g/dL — AB (ref 13.0–17.0)

## 2014-05-08 MED ORDER — EPOETIN ALFA 10000 UNIT/ML IJ SOLN
INTRAMUSCULAR | Status: AC
  Filled 2014-05-08: qty 1

## 2014-05-08 MED ORDER — EPOETIN ALFA 10000 UNIT/ML IJ SOLN
10000.0000 [IU] | INTRAMUSCULAR | Status: DC
  Administered 2014-05-08: 10000 [IU] via SUBCUTANEOUS

## 2014-05-08 MED ORDER — EPOETIN ALFA 20000 UNIT/ML IJ SOLN
INTRAMUSCULAR | Status: AC
  Filled 2014-05-08: qty 1

## 2014-05-15 ENCOUNTER — Encounter (HOSPITAL_COMMUNITY): Payer: Medicare HMO

## 2014-05-22 ENCOUNTER — Encounter (HOSPITAL_COMMUNITY)
Admission: RE | Admit: 2014-05-22 | Discharge: 2014-05-22 | Disposition: A | Discharge status: Home / Self Care | Payer: Medicare HMO | Source: Ambulatory Visit | Attending: Nephrology | Admitting: Nephrology

## 2014-05-22 DIAGNOSIS — N185 Chronic kidney disease, stage 5: Secondary | ICD-10-CM | POA: Diagnosis not present

## 2014-05-22 LAB — RENAL FUNCTION PANEL
Albumin: 3.8 g/dL (ref 3.5–5.2)
Anion gap: 17 — ABNORMAL HIGH (ref 5–15)
BUN: 44 mg/dL — ABNORMAL HIGH (ref 6–23)
CHLORIDE: 106 meq/L (ref 96–112)
CO2: 20 meq/L (ref 19–32)
CREATININE: 2.84 mg/dL — AB (ref 0.50–1.35)
Calcium: 9.8 mg/dL (ref 8.4–10.5)
GFR calc Af Amer: 22 mL/min — ABNORMAL LOW (ref >90)
GFR, EST NON AFRICAN AMERICAN: 19 mL/min — AB (ref >90)
GLUCOSE: 91 mg/dL (ref 70–99)
Phosphorus: 4.3 mg/dL (ref 2.3–4.6)
Potassium: 4.2 mEq/L (ref 3.7–5.3)
Sodium: 143 mEq/L (ref 137–147)

## 2014-05-22 LAB — CBC
HCT: 34.4 % — ABNORMAL LOW (ref 39.0–52.0)
HEMOGLOBIN: 11.5 g/dL — AB (ref 13.0–17.0)
MCH: 28.4 pg (ref 26.0–34.0)
MCHC: 33.4 g/dL (ref 30.0–36.0)
MCV: 84.9 fL (ref 78.0–100.0)
Platelets: 107 10*3/uL — ABNORMAL LOW (ref 150–400)
RBC: 4.05 MIL/uL — ABNORMAL LOW (ref 4.22–5.81)
RDW: 17.3 % — AB (ref 11.5–15.5)
WBC: 3.9 10*3/uL — ABNORMAL LOW (ref 4.0–10.5)

## 2014-05-22 LAB — MAGNESIUM: Magnesium: 2 mg/dL (ref 1.5–2.5)

## 2014-05-22 MED ORDER — EPOETIN ALFA 10000 UNIT/ML IJ SOLN: 10000.0000 [IU] | INTRAMUSCULAR | Status: DC

## 2014-05-23 LAB — VITAMIN D 25 HYDROXY (VIT D DEFICIENCY, FRACTURES): VIT D 25 HYDROXY: 57 ng/mL (ref 30–89)

## 2014-06-05 ENCOUNTER — Encounter (HOSPITAL_COMMUNITY): Payer: Medicare HMO

## 2014-06-11 ENCOUNTER — Emergency Department (HOSPITAL_COMMUNITY): Payer: Medicare HMO

## 2014-06-11 ENCOUNTER — Inpatient Hospital Stay (HOSPITAL_COMMUNITY)
Admission: EM | Admit: 2014-06-11 | Discharge: 2014-06-14 | DRG: 065 | Disposition: A | Discharge status: Rehabilitation Facility | Payer: Medicare HMO | Attending: Neurology | Admitting: Neurology

## 2014-06-11 ENCOUNTER — Encounter (HOSPITAL_COMMUNITY): Payer: Self-pay | Admitting: Emergency Medicine

## 2014-06-11 DIAGNOSIS — R29898 Other symptoms and signs involving the musculoskeletal system: Secondary | ICD-10-CM | POA: Diagnosis present

## 2014-06-11 DIAGNOSIS — I6529 Occlusion and stenosis of unspecified carotid artery: Secondary | ICD-10-CM | POA: Diagnosis present

## 2014-06-11 DIAGNOSIS — Z7901 Long term (current) use of anticoagulants: Secondary | ICD-10-CM

## 2014-06-11 DIAGNOSIS — D472 Monoclonal gammopathy: Secondary | ICD-10-CM | POA: Diagnosis present

## 2014-06-11 DIAGNOSIS — E1149 Type 2 diabetes mellitus with other diabetic neurological complication: Secondary | ICD-10-CM | POA: Diagnosis present

## 2014-06-11 DIAGNOSIS — I482 Chronic atrial fibrillation, unspecified: Secondary | ICD-10-CM

## 2014-06-11 DIAGNOSIS — I441 Atrioventricular block, second degree: Secondary | ICD-10-CM | POA: Diagnosis present

## 2014-06-11 DIAGNOSIS — Z79899 Other long term (current) drug therapy: Secondary | ICD-10-CM

## 2014-06-11 DIAGNOSIS — G8190 Hemiplegia, unspecified affecting unspecified side: Secondary | ICD-10-CM | POA: Diagnosis present

## 2014-06-11 DIAGNOSIS — I4892 Unspecified atrial flutter: Secondary | ICD-10-CM | POA: Diagnosis present

## 2014-06-11 DIAGNOSIS — I4891 Unspecified atrial fibrillation: Secondary | ICD-10-CM | POA: Diagnosis present

## 2014-06-11 DIAGNOSIS — E785 Hyperlipidemia, unspecified: Secondary | ICD-10-CM | POA: Diagnosis present

## 2014-06-11 DIAGNOSIS — R29810 Facial weakness: Secondary | ICD-10-CM | POA: Diagnosis present

## 2014-06-11 DIAGNOSIS — N2581 Secondary hyperparathyroidism of renal origin: Secondary | ICD-10-CM | POA: Diagnosis present

## 2014-06-11 DIAGNOSIS — N185 Chronic kidney disease, stage 5: Secondary | ICD-10-CM

## 2014-06-11 DIAGNOSIS — D689 Coagulation defect, unspecified: Secondary | ICD-10-CM

## 2014-06-11 DIAGNOSIS — Z87891 Personal history of nicotine dependence: Secondary | ICD-10-CM

## 2014-06-11 DIAGNOSIS — I120 Hypertensive chronic kidney disease with stage 5 chronic kidney disease or end stage renal disease: Secondary | ICD-10-CM | POA: Diagnosis present

## 2014-06-11 DIAGNOSIS — I619 Nontraumatic intracerebral hemorrhage, unspecified: Secondary | ICD-10-CM | POA: Diagnosis present

## 2014-06-11 DIAGNOSIS — I2510 Atherosclerotic heart disease of native coronary artery without angina pectoris: Secondary | ICD-10-CM | POA: Diagnosis present

## 2014-06-11 DIAGNOSIS — I5032 Chronic diastolic (congestive) heart failure: Secondary | ICD-10-CM

## 2014-06-11 DIAGNOSIS — E1142 Type 2 diabetes mellitus with diabetic polyneuropathy: Secondary | ICD-10-CM | POA: Diagnosis present

## 2014-06-11 DIAGNOSIS — I2789 Other specified pulmonary heart diseases: Secondary | ICD-10-CM | POA: Diagnosis present

## 2014-06-11 DIAGNOSIS — I10 Essential (primary) hypertension: Secondary | ICD-10-CM

## 2014-06-11 DIAGNOSIS — R471 Dysarthria and anarthria: Secondary | ICD-10-CM | POA: Diagnosis present

## 2014-06-11 DIAGNOSIS — I6350 Cerebral infarction due to unspecified occlusion or stenosis of unspecified cerebral artery: Secondary | ICD-10-CM | POA: Diagnosis present

## 2014-06-11 DIAGNOSIS — Z951 Presence of aortocoronary bypass graft: Secondary | ICD-10-CM | POA: Diagnosis not present

## 2014-06-11 DIAGNOSIS — K219 Gastro-esophageal reflux disease without esophagitis: Secondary | ICD-10-CM | POA: Diagnosis present

## 2014-06-11 DIAGNOSIS — Z794 Long term (current) use of insulin: Secondary | ICD-10-CM | POA: Diagnosis not present

## 2014-06-11 LAB — CBC
HEMATOCRIT: 35.9 % — AB (ref 39.0–52.0)
Hemoglobin: 11.8 g/dL — ABNORMAL LOW (ref 13.0–17.0)
MCH: 28.4 pg (ref 26.0–34.0)
MCHC: 32.9 g/dL (ref 30.0–36.0)
MCV: 86.3 fL (ref 78.0–100.0)
Platelets: 112 10*3/uL — ABNORMAL LOW (ref 150–400)
RBC: 4.16 MIL/uL — ABNORMAL LOW (ref 4.22–5.81)
RDW: 17.3 % — AB (ref 11.5–15.5)
WBC: 4.2 10*3/uL (ref 4.0–10.5)

## 2014-06-11 LAB — DIFFERENTIAL
BASOS PCT: 0 % (ref 0–1)
Basophils Absolute: 0 10*3/uL (ref 0.0–0.1)
EOS ABS: 0 10*3/uL (ref 0.0–0.7)
Eosinophils Relative: 1 % (ref 0–5)
Lymphocytes Relative: 26 % (ref 12–46)
Lymphs Abs: 1.1 10*3/uL (ref 0.7–4.0)
MONO ABS: 0.2 10*3/uL (ref 0.1–1.0)
Monocytes Relative: 6 % (ref 3–12)
Neutro Abs: 2.9 10*3/uL (ref 1.7–7.7)
Neutrophils Relative %: 68 % (ref 43–77)

## 2014-06-11 LAB — COMPREHENSIVE METABOLIC PANEL
ALBUMIN: 3.9 g/dL (ref 3.5–5.2)
ALT: 11 U/L (ref 0–53)
ANION GAP: 15 (ref 5–15)
AST: 20 U/L (ref 0–37)
Alkaline Phosphatase: 103 U/L (ref 39–117)
BILIRUBIN TOTAL: 0.7 mg/dL (ref 0.3–1.2)
BUN: 35 mg/dL — AB (ref 6–23)
CALCIUM: 9.9 mg/dL (ref 8.4–10.5)
CO2: 21 mEq/L (ref 19–32)
CREATININE: 2.47 mg/dL — AB (ref 0.50–1.35)
Chloride: 106 mEq/L (ref 96–112)
GFR calc Af Amer: 27 mL/min — ABNORMAL LOW (ref >90)
GFR calc non Af Amer: 23 mL/min — ABNORMAL LOW (ref >90)
Glucose, Bld: 147 mg/dL — ABNORMAL HIGH (ref 70–99)
Potassium: 4 mEq/L (ref 3.7–5.3)
Sodium: 142 mEq/L (ref 137–147)
TOTAL PROTEIN: 8.2 g/dL (ref 6.0–8.3)

## 2014-06-11 LAB — CBG MONITORING, ED: GLUCOSE-CAPILLARY: 168 mg/dL — AB (ref 70–99)

## 2014-06-11 LAB — I-STAT TROPONIN, ED: Troponin i, poc: 0.02 ng/mL (ref 0.00–0.08)

## 2014-06-11 LAB — PROTIME-INR
INR: 2.1 — AB (ref 0.00–1.49)
Prothrombin Time: 23.6 seconds — ABNORMAL HIGH (ref 11.6–15.2)

## 2014-06-11 LAB — APTT: aPTT: 36 seconds (ref 24–37)

## 2014-06-11 MED ORDER — STROKE: EARLY STAGES OF RECOVERY BOOK
Freq: Once | Status: AC
  Administered 2014-06-12: 23:00:00
  Filled 2014-06-11 (×3): qty 1

## 2014-06-11 MED ORDER — SENNOSIDES-DOCUSATE SODIUM 8.6-50 MG PO TABS
1.0000 | ORAL_TABLET | Freq: Two times a day (BID) | ORAL | Status: DC
  Administered 2014-06-12 – 2014-06-14 (×5): 1 via ORAL
  Filled 2014-06-11 (×7): qty 1

## 2014-06-11 MED ORDER — PANTOPRAZOLE SODIUM 40 MG IV SOLR
40.0000 mg | Freq: Every day | INTRAVENOUS | Status: DC
  Filled 2014-06-11: qty 40

## 2014-06-11 MED ORDER — LABETALOL HCL 5 MG/ML IV SOLN
10.0000 mg | Freq: Once | INTRAVENOUS | Status: AC
  Administered 2014-06-11: 10 mg via INTRAVENOUS
  Filled 2014-06-11: qty 4

## 2014-06-11 MED ORDER — INSULIN ASPART 100 UNIT/ML ~~LOC~~ SOLN
0.0000 [IU] | Freq: Three times a day (TID) | SUBCUTANEOUS | Status: DC
  Administered 2014-06-12 (×2): 3 [IU] via SUBCUTANEOUS
  Administered 2014-06-13: 5 [IU] via SUBCUTANEOUS
  Administered 2014-06-13: 2 [IU] via SUBCUTANEOUS
  Administered 2014-06-13 – 2014-06-14 (×2): 3 [IU] via SUBCUTANEOUS
  Administered 2014-06-14 (×2): 2 [IU] via SUBCUTANEOUS

## 2014-06-11 MED ORDER — LABETALOL HCL 5 MG/ML IV SOLN: 10.0000 mg | INTRAVENOUS | Status: DC | PRN

## 2014-06-11 MED ORDER — ACETAMINOPHEN 325 MG PO TABS
650.0000 mg | ORAL_TABLET | ORAL | Status: DC | PRN
Start: 2014-06-11 — End: 2014-06-14

## 2014-06-11 MED ORDER — PHYTONADIONE 5 MG PO TABS
5.0000 mg | ORAL_TABLET | Freq: Once | ORAL | Status: AC
  Administered 2014-06-11: 5 mg via ORAL
  Filled 2014-06-11: qty 1

## 2014-06-11 MED ORDER — ACETAMINOPHEN 650 MG RE SUPP: 650.0000 mg | RECTAL | Status: DC | PRN

## 2014-06-11 NOTE — ED Notes (Signed)
CBG 168. 

## 2014-06-11 NOTE — ED Provider Notes (Signed)
CSN: [id number]     Arrival date & time 06/11/14  1343 History   First MD Initiated Contact with Patient 06/11/14 1456     Chief Complaint  Patient presents with  . stroke like symptoms      (Consider location/radiation/quality/duration/timing/severity/associated sxs/prior Treatment) HPI Comments: Patient presents with difficulty walking, difficulty speech, left-sided weakness for the past 4 days. The last time he was seen normal was on September 11. His family could not get him to come in earlier. He denies any pain. He denies any headache, dizziness, chest pain shortness of breath all 2 days ago but denies any injury from the fall. His son endorses difficulty speaking with slurred speech and difficulty walking and ataxic gait. No chest pain, shortness of breath or abdominal pain. No dizziness or lightheadedness. Patient has a history of CAD, CHF, CAD, atrial fibrillation on Coumadin. States compliance with medications.  The history is provided by the patient and a relative.    Past Medical History  Diagnosis Date  . CAD (coronary artery disease)     s/p 3v CABG 2006, myoview 04/2010 EF 49%, prior inferior/apical infarct, no ischemia, LHC 11/2011 stable anatomy (occluded LAD filled from vein graft, distal LCx occluded prior to OM2, OM2 filled from vein graft, RCA prox 25%, mid 40%, distal 25% lesions, SVG to diagonal occluded) Med Rx  . Chronic diastolic heart failure     hx of cardiorenal syndrome;  Echo 06/2012 EF 94-49%, mod diastolic dysfunction, mild MR, mod TR, mild R/LAE, mild RV dilatation, PASP 72mmHg.  . Carotid stenosis     a. Carotid dopplers R 40-59%, Left 60-79%;   b. carotids 12/13:  6-75% RICA, 91-63% LICA (rpt in 6 mos);  c.  Carotid US (11/14):  R 40-59%; L 60-79%, R vertebral occluded, L vertebral antegrade  . HLD (hyperlipidemia)   . Mediastinal adenopathy     per CT chest in 2006 with PET scan showing very limited metabolic activity; not felt to have a significant  neoplastic potential  . Iron deficiency anemia   . Hypertension   . Diabetes mellitus     insulin dependent   . GERD (gastroesophageal reflux disease)   . AV block, Mobitz 1     Noted 11/2011 in hospital, BB stopped  . Colon polyps   . Diverticulosis   . Pulmonary arterial hypertension     a.  RHC 11/1 PASP 60 mmHg (mean 38); b. RHC 11/15 PA pressure 66/24, PCWP 26 and CO 6.2 => Sildenafil started;   c. Echo bubble study 11/13:  no obvious shunt;   d. VQ scan neg for pulmonary embolism  . CKD (chronic kidney disease) stage 5, GFR less than 15 ml/min     hx of cardiorenal syndrome;  Dr Posey Pronto;  AVF recommended and scheduled (patient hesitant to proceed);   Renal US 11/13: diff echogenic kidneys c/w medical renal disease; no hydronephrosis or renal mass  . Hyperparathyroidism, secondary renal   . MGUS (monoclonal gammopathy of unknown significance)     a. Kappa/Lambda free light chain ratio 11/13:  23.99;  b. Met. Bone survey 11/13:  no osteolytic lesions to suggest MMyeloma  . Atrial flutter     diagnosed 05/2013; Coumadin initiated   Past Surgical History  Procedure Laterality Date  . Appendectomy    . Coronary artery bypass graft  2006    SVG to OM2, SVG to LAD, SVG to DX; (the LIMA did not have good flow and therefore was not used)  .  Cardiac catheterization  12/21/2011   Family History  Problem Relation Age of Onset  . Stomach cancer Sister   . Pancreatic cancer Brother   . Lung cancer Brother   . Colon cancer Neg Hx    History  Substance Use Topics  . Smoking status: Former Smoker    Quit date: 09/27/1953  . Smokeless tobacco: Never Used  . Alcohol Use: No    Review of Systems  Constitutional: Negative for fever, activity change and appetite change.  HENT: Negative for congestion and rhinorrhea.   Respiratory: Negative for cough, chest tightness and shortness of breath.   Cardiovascular: Negative for chest pain, palpitations and leg swelling.  Gastrointestinal: Negative  for nausea, vomiting and abdominal pain.  Genitourinary: Negative for dysuria.  Musculoskeletal: Positive for gait problem. Negative for arthralgias and myalgias.  Skin: Negative for pallor and rash.  Neurological: Positive for facial asymmetry, speech difficulty and weakness. Negative for dizziness, light-headedness, numbness and headaches.  A complete 10 system review of systems was obtained and all systems are negative except as noted in the HPI and PMH.      Allergies  Review of patient's allergies indicates no known allergies.  Home Medications   Prior to Admission medications   Medication Sig Start Date End Date Taking? Authorizing Provider  ADVOCATE LANCETS MISC  08/10/13  Yes Historical Provider, MD  Alcohol Swabs (ALCOHOL PREP) 70 % PADS  07/31/13  Yes Historical Provider, MD  amLODipine (NORVASC) 10 MG tablet Take 10 mg by mouth daily.   Yes Historical Provider, MD  atorvastatin (LIPITOR) 20 MG tablet take 1 tablet by mouth at bedtime 09/14/13  Yes Lelon Perla, MD  Blood Glucose Calibration (OT ULTRA/FASTTK CNTRL SOLN) SOLN  07/31/13  Yes Historical Provider, MD  carvedilol (COREG) 6.25 MG tablet Take 1 tablet (6.25 mg total) by mouth 2 (two) times daily. 09/05/13  Yes Neena Rhymes, MD  furosemide (LASIX) 80 MG tablet Take 80 mg by mouth daily.   Yes Historical Provider, MD  insulin detemir (LEVEMIR) 100 UNIT/ML injection Inject 0.15 mLs (15 Units total) into the skin at bedtime. 05/06/14  Yes Rowe Clack, MD  Insulin Pen Needle (NOVOFINE) 30G X 8 MM MISC Use as directed once daily with insulin DUE FOR LABS 05/03/14  Yes Hendricks Limes, MD  Lancet Devices (ADVOCATE RAPID-SAFE LANCING) Jensen  07/31/13  Yes Historical Provider, MD  ONE TOUCH ULTRA TEST test strip  08/10/13  Yes Historical Provider, MD  potassium chloride SA (K-DUR,KLOR-CON) 20 MEQ tablet Take 20 mEq by mouth 2 (two) times daily.   Yes Historical Provider, MD  saxagliptin HCl (ONGLYZA) 2.5 MG TABS  tablet Take 1 tablet (2.5 mg total) by mouth daily. 10/04/13  Yes Neena Rhymes, MD  Vitamin D, Ergocalciferol, (DRISDOL) 50000 UNITS CAPS capsule Take 50,000 Units by mouth every 7 (seven) days.   Yes Historical Provider, MD  warfarin (COUMADIN) 6 MG tablet Take 1 tablet (6 mg total) by mouth as directed. 01/28/14  Yes Lelon Perla, MD   BP 154/69  Pulse 55  Temp(Src) 98 F (36.7 C) (Axillary)  Resp 28  Ht $R'5\' 11"'VU$  (1.803 m)  Wt 178 lb 14.4 oz (81.149 kg)  BMI 24.96 kg/m2  SpO2 96% Physical Exam  Nursing note and vitals reviewed. Constitutional: He is oriented to person, place, and time. He appears well-developed and well-nourished. No distress.  HENT:  Head: Normocephalic and atraumatic.  Mouth/Throat: Oropharynx is clear and moist. No oropharyngeal exudate.  Eyes: Conjunctivae and EOM are normal. Pupils are equal, round, and reactive to light.  Neck: Normal range of motion. Neck supple.  No meningismus.  Cardiovascular: Normal rate, regular rhythm, normal heart sounds and intact distal pulses.   No murmur heard. Pulmonary/Chest: Effort normal and breath sounds normal. No respiratory distress.  Abdominal: Soft. There is no tenderness. There is no rebound and no guarding.  Musculoskeletal: Normal range of motion. He exhibits no edema and no tenderness.  Neurological: He is alert and oriented to person, place, and time. A cranial nerve deficit is present. He exhibits normal muscle tone. Coordination normal.  Left-sided facial droop, dysarthric speech. No aphasia. 4-5 strength in left arm and left leg. Pronator drift of left arm. Gait not tested   Skin: Skin is warm.  Psychiatric: He has a normal mood and affect. His behavior is normal.    ED Course  Procedures (including critical care time) Labs Review Labs Reviewed  PROTIME-INR - Abnormal; Notable for the following:    Prothrombin Time 23.6 (*)    INR 2.10 (*)    All other components within normal limits  CBC - Abnormal;  Notable for the following:    RBC 4.16 (*)    Hemoglobin 11.8 (*)    HCT 35.9 (*)    RDW 17.3 (*)    Platelets 112 (*)    All other components within normal limits  COMPREHENSIVE METABOLIC PANEL - Abnormal; Notable for the following:    Glucose, Bld 147 (*)    BUN 35 (*)    Creatinine, Ser 2.47 (*)    GFR calc non Af Amer 23 (*)    GFR calc Af Amer 27 (*)    All other components within normal limits  CBG MONITORING, ED - Abnormal; Notable for the following:    Glucose-Capillary 168 (*)    All other components within normal limits  MRSA PCR SCREENING  APTT  DIFFERENTIAL  HEMOGLOBIN A1C  I-STAT TROPOININ, ED  PREPARE FRESH FROZEN PLASMA    Imaging Review Ct Head (brain) Wo Contrast  06/11/2014   CLINICAL DATA:  Stroke like symptoms trauma slurred speech and leg weakness  EXAM: CT HEAD WITHOUT CONTRAST  TECHNIQUE: Contiguous axial images were obtained from the base of the skull through the vertex without intravenous contrast.  COMPARISON:  06/28/2013, 06/30/2013  FINDINGS: There is severe diffuse atrophy and low attenuation in the deep white matter diffusely. This is unchanged from the prior study. There is proportional dilatation of the ventricles. There is no evidence of abnormality involving the calvarium and there is no significant inflammatory change in the sinuses.  In the right pontomedullary junction there is a 14 x 22 mm oval focus of hyperattenuation that was not present previously. There is no extra-axial hemorrhage. There is no evidence of vascular territory ischemic infarct or mass. Atherosclerotic calcification of the intracranial vessels is again noted.  IMPRESSION: Superimposed on chronic hypertensive involutional change, there is a 22 x 14 mm hyper attenuating focus in the right pontomedullary junction most consistent with acute hemorrhage. Critical Value/emergent results were called by telephone at the time of interpretation on 06/11/2014 at 5:22 pm to Dr. Ezequiel Essex ,  who verbally acknowledged these results.   Electronically Signed   By: Skipper Cliche M.D.   On: 06/11/2014 17:22     EKG Interpretation None      MDM   Final diagnoses:  Hemorrhagic stroke  Coagulopathy   Slurred speech with left-sided weakness, last seen normal  4 days ago. No chest pain or shortness of breath. No headache or dizziness.  Code stroke not activated do to delay in presentation. Patient was seen normal 4 days ago. INR 2.1   CT shows 22 x 14 mm hemorrhage at pontomedullary junction on the right. Neuro exam remains unchanged. INR 2.1. BP 621 systolic.  Discussed with Dr. Armida Sans of neurology. He will see patient. Does not recommend emergent reversal of INR at this time. Discussed with Dr. Ellene Route of neurosurgery. He states there is nothing surgical for him to do. Agrees with neurology admission. Agrees patient does not need emergent reversal.  Dr. Armida Sans has seen patient. Will order vitamin K and FFP. He is maintaining his airway and awake and alert. Mental status is stable. His symptoms have been ongoing for 4 days. Will hold off on K. centra at this time per Dr. Armida Sans.  FFP and vitamin K ordered to reverse INR. Hold K centra at this time per Dr. Armida Sans and Dr. Ellene Route as symptoms are at least 23 days old and mental status is stable.     Date: 06/11/2014  Rate: 64  Rhythm: atrial fibrillation  QRS Axis: normal  Intervals: normal  ST/T Wave abnormalities: nonspecific ST/T changes  Conduction Disutrbances:none  Narrative Interpretation:   Old EKG Reviewed: unchanged    CRITICAL CARE Performed by: Ezequiel Essex Total critical care time: 35 Critical care time was exclusive of separately billable procedures and treating other patients. Critical care was necessary to treat or prevent imminent or life-threatening deterioration. Critical care was time spent personally by me on the following activities: development of treatment plan with patient and/or surrogate as  well as nursing, discussions with consultants, evaluation of patient's response to treatment, examination of patient, obtaining history from patient or surrogate, ordering and performing treatments and interventions, ordering and review of laboratory studies, ordering and review of radiographic studies, pulse oximetry and re-evaluation of patient's condition.   Ezequiel Essex, MD 06/11/14 2352

## 2014-06-11 NOTE — ED Notes (Signed)
Blood bank called to verify 2nd unit of FFP is ready.

## 2014-06-11 NOTE — ED Notes (Signed)
Gave report, however the pt's room is not clean. 3MW RN states she will call this RN will call and inform when room is ready.

## 2014-06-11 NOTE — ED Notes (Signed)
Pts family at bedside, [place], MD to update on CT results

## 2014-06-11 NOTE — ED Notes (Signed)
Attempted to draw pts labs was unsuccessful. Pt requesting only to be stuck in right wrist. Called phlebotomy and spoke with Maudie Mercury.

## 2014-06-11 NOTE — ED Notes (Signed)
Patient states he started having weakness, trouble walking and slurred speech x 2-3 days ago.   Patient states the symptoms have persisted.   Patient son states that patient did not want to come in earlier.

## 2014-06-11 NOTE — ED Notes (Signed)
Phlebotomy at the bedside  

## 2014-06-11 NOTE — ED Notes (Signed)
Spoke with CT re: estimated time of CT scan, pt updated on plan of care

## 2014-06-11 NOTE — Consult Note (Signed)
Referring Physician: Dr. Wyvonnia Dusky    Chief Complaint: left hemiparesis, left face weakness, dysarthria.   HPI:                                                                                                                                         [name] is an 80 y.o. male with PMH significant for HTN, hyperlipidemia, DM, CAD s/p CABG, atrial flutter on coumadin, CKD, MGUS, brought in by family for further evaluation of the above stated symptoms. Patient stated that he started having " weakness in my legs, mainly the left leg and also in my left arm" several days ago which impeded normal walking to the point that he had a fall in which did not injured his head. [name] addition, his son said that he has been noticing slurred speech and droopiness of his left face since this past Friday. Denies HA, vertigo, double vision, difficulty swallowing, confusion, or visual disturbances. CT brain today showed a right pontomedullary hematoma measuring 14 x 22. Neurosurgery was consulted by the ED and not aggressive neurosurgical intervention was recommended. INR 2.10   SBP 160 in the ED and received 10 mg IV labetalol. Date last known well: unclear Time last known well: unclear tPA Given: no, ICH   Past Medical History  Diagnosis Date  . CAD (coronary artery disease)     s/p 3v CABG 2006, myoview 04/2010 EF 49%, prior inferior/apical infarct, no ischemia, LHC 11/2011 stable anatomy (occluded LAD filled from vein graft, distal LCx occluded prior to OM2, OM2 filled from vein graft, RCA prox 25%, mid 40%, distal 25% lesions, SVG to diagonal occluded) Med Rx  . Chronic diastolic heart failure     hx of cardiorenal syndrome;  Echo 06/2012 EF 31-51%, mod diastolic dysfunction, mild MR, mod TR, mild R/LAE, mild RV dilatation, PASP 20mHg.  . Carotid stenosis     a. Carotid dopplers R 40-59%, Left 60-79%;   b. carotids 12/13:  07-61%RICA, 660-73%LICA (rpt in 6 mos);  c.  Carotid [place](11/14):  R 40-59%; L  60-79%, R vertebral occluded, L vertebral antegrade  . HLD (hyperlipidemia)   . Mediastinal adenopathy     per CT chest in 2006 with PET scan showing very limited metabolic activity; not felt to have a significant neoplastic potential  . Iron deficiency anemia   . Hypertension   . Diabetes mellitus     insulin dependent   . GERD (gastroesophageal reflux disease)   . AV block, Mob[place] 1     Noted 11/2011 in hospital, BB stopped  . Colon polyps   . Diverticulosis   . Pulmonary arterial hypertension     a.  RHC 11/1 PASP 60 mmHg (mean 38); b. RHC 11/15 PA pressure 66/24, PCWP 26 and CO 6.2 => Sildenafil started;   c. Echo bubble study 11/13:  no obvious shunt;   d. VQ  scan neg for pulmonary embolism  . CKD (chronic kidney disease) stage 5, GFR less than 15 ml/min     hx of cardiorenal syndrome;  Dr Posey Pronto;  AVF recommended and scheduled (patient hesitant to proceed);   Renal US 11/13: diff echogenic kidneys c/w medical renal disease; no hydronephrosis or renal mass  . Hyperparathyroidism, secondary renal   . MGUS (monoclonal gammopathy of unknown significance)     a. Kappa/Lambda free light chain ratio 11/13:  23.99;  b. Met. Bone survey 11/13:  no osteolytic lesions to suggest MMyeloma  . Atrial flutter     diagnosed 05/2013; Coumadin initiated    Past Surgical History  Procedure Laterality Date  . Appendectomy    . Coronary artery bypass graft  2006    SVG to OM2, SVG to LAD, SVG to DX; (the LIMA did not have good flow and therefore was not used)  . Cardiac catheterization  12/21/2011    Family History  Problem Relation Age of Onset  . Stomach cancer Sister   . Pancreatic cancer Brother   . Lung cancer Brother   . Colon cancer Neg Hx    Social History:  reports that he quit smoking about 60 years ago. He has never used smokeless tobacco. He reports that he does not drink alcohol or use illicit drugs.  Allergies: No Known Allergies  Medications:                                                                                                                            I have reviewed the patient's current medications.  ROS:                                                                                                                                       History obtained from chart review, patient and son  General ROS: negative for - chills, fatigue, fever, night sweats, weight gain or weight loss Psychological ROS: negative for - behavioral disorder, hallucinations, memory difficulties, mood swings or suicidal ideation Ophthalmic ROS: negative for - blurry vision, double vision, eye pain or loss of vision ENT ROS: negative for - epistaxis, nasal discharge, oral lesions, sore throat, tinnitus or vertigo Allergy and Immunology ROS: negative for - hives or itchy/watery eyes Hematological and Lymphatic ROS: negative for - bleeding problems, bruising or swollen lymph nodes Endocrine ROS: negative for -  galactorrhea, hair pattern changes, polydipsia/polyuria or temperature intolerance Respiratory ROS: negative for - cough, hemoptysis, shortness of breath or wheezing Cardiovascular ROS: negative for - chest pain, dyspnea on exertion, edema or irregular heartbeat Gastrointestinal ROS: negative for - abdominal pain, diarrhea, hematemesis, nausea/vomiting or stool incontinence Genito-Urinary ROS: negative for - dysuria, hematuria, incontinence or urinary frequency/urgency Musculoskeletal ROS: negative for - joint swelling Neurological ROS: as noted in HPI Dermatological ROS: negative for rash and skin lesion changes  Physical exam: pleasant male in no apparent distress. Blood pressure 157/68, pulse 53, temperature 97.9 F (36.6 C), temperature source Oral, resp. rate 16, height _0  (1.803 m), weight 90.719 kg (200 lb), SpO2 99.00%. Head: normocephalic. Neck: supple, no bruits, no JVD. Cardiac: no murmurs. Lungs: clear. Abdomen: soft, no tender, no mass. Extremities: no  edema. Neurologic Examination:                                                                                                      General: Mental Status: Alert, oriented, thought content appropriate.  Mild dysarthria without evidence of aphasia.  Able to follow 3 step commands without difficulty. Cranial Nerves: II: Discs flat bilaterally; Visual fields grossly normal, pupils equal, round, reactive to light and accommodation III,IV, VI: ptosis not present, extra-ocular motions intact bilaterally V,VII: smile asymmetric due to left face weakness, facial light touch sensation normal bilaterally VIII: hearing normal bilaterally IX,X: gag reflex present XI: bilateral shoulder shrug XII: midline tongue extension without atrophy or fasciculations Motor: Significant for mild left hemiparesis. Sensory: Pinprick and light touch intact throughout, bilaterally Deep Tendon Reflexes:  Right: Upper Extremity   Left: Upper extremity   biceps (C-5 to C-6) 2/4   biceps (C-5 to C-6) 2/4 tricep (C7) 2/4    triceps (C7) 2/4 Brachioradialis (C6) 2/4  Brachioradialis (C6) 2/4  Lower Extremity Lower Extremity  quadriceps (L-2 to L-4) 2/4   quadriceps (L-2 to L-4) 2/4 Achilles (S1) 2/4   Achilles (S1) 2/4 Plantars: Right: downgoing   Left: upgoing Cerebellar: normal finger-to-nose,  normal heel-to-shin test Gait: No tested CV: pulses palpable throughout    Results for orders placed during the hospital encounter of 06/11/14 (from the past 48 hour(s))  PROTIME-INR     Status: Abnormal   Collection Time    06/11/14  2:07 PM      Result Value Ref Range   Prothrombin Time 23.6 (*) 11.6 - 15.2 seconds   INR 2.10 (*) 0.00 - 1.49  APTT     Status: None   Collection Time    06/11/14  2:07 PM      Result Value Ref Range   aPTT 36  24 - 37 seconds  CBC     Status: Abnormal   Collection Time    06/11/14  2:07 PM      Result Value Ref Range   WBC 4.2  4.0 - 10.5 K/uL   RBC 4.16 (*) 4.22 - 5.81  MIL/uL   Hemoglobin 11.8 (*) 13.0 - 17.0 g/dL   HCT 35.9 (*) 39.0 - 52.0 %   MCV  86.3  78.0 - 100.0 fL   MCH 28.4  26.0 - 34.0 pg   MCHC 32.9  30.0 - 36.0 g/dL   RDW 17.3 (*) 11.5 - 15.5 %   Platelets 112 (*) 150 - 400 K/uL   Comment: PLATELET COUNT CONFIRMED BY SMEAR  DIFFERENTIAL     Status: None   Collection Time    06/11/14  2:07 PM      Result Value Ref Range   Neutrophils Relative % 68  43 - 77 %   Neutro Abs 2.9  1.7 - 7.7 K/uL   Lymphocytes Relative 26  12 - 46 %   Lymphs Abs 1.1  0.7 - 4.0 K/uL   Monocytes Relative 6  3 - 12 %   Monocytes Absolute 0.2  0.1 - 1.0 K/uL   Eosinophils Relative 1  0 - 5 %   Eosinophils Absolute 0.0  0.0 - 0.7 K/uL   Basophils Relative 0  0 - 1 %   Basophils Absolute 0.0  0.0 - 0.1 K/uL  COMPREHENSIVE METABOLIC PANEL     Status: Abnormal   Collection Time    06/11/14  2:07 PM      Result Value Ref Range   Sodium 142  137 - 147 mEq/L   Potassium 4.0  3.7 - 5.3 mEq/L   Chloride 106  96 - 112 mEq/L   CO2 21  19 - 32 mEq/L   Glucose, Bld 147 (*) 70 - 99 mg/dL   BUN 35 (*) 6 - 23 mg/dL   Creatinine, Ser 2.47 (*) 0.50 - 1.35 mg/dL   Calcium 9.9  8.4 - 10.5 mg/dL   Total Protein 8.2  6.0 - 8.3 g/dL   Albumin 3.9  3.5 - 5.2 g/dL   AST 20  0 - 37 U/L   ALT 11  0 - 53 U/L   Alkaline Phosphatase 103  39 - 117 U/L   Total Bilirubin 0.7  0.3 - 1.2 mg/dL   GFR calc non Af Amer 23 (*) >90 mL/min   GFR calc Af Amer 27 (*) >90 mL/min   Comment: (NOTE)     The eGFR has been calculated using the CKD EPI equation.     This calculation has not been validated in all clinical situations.     eGFR's persistently <90 mL/min signify possible Chronic Kidney     Disease.   Anion gap 15  5 - 15  I-STAT TROPOININ, ED     Status: None   Collection Time    06/11/14  3:05 PM      Result Value Ref Range   Troponin i, poc 0.02  0.00 - 0.08 ng/mL   Comment 3            Comment: Due to the release kinetics of cTnI,     a negative result within the first hours      of the onset of symptoms does not rule out     myocardial infarction with certainty.     If myocardial infarction is still suspected,     repeat the test at appropriate intervals.  CBG MONITORING, ED     Status: Abnormal   Collection Time    06/11/14  3:41 PM      Result Value Ref Range   Glucose-Capillary 168 (*) 70 - 99 mg/dL   Comment 1 Documented in Chart     Comment 2 Notify RN     Ct Head (brain) Wo Contrast  06/11/2014   CLINICAL DATA:  Stroke like symptoms trauma slurred speech and leg weakness  EXAM: CT HEAD WITHOUT CONTRAST  TECHNIQUE: Contiguous axial images were obtained from the base of the skull through the vertex without intravenous contrast.  COMPARISON:  06/28/2013, 06/30/2013  FINDINGS: There is severe diffuse atrophy and low attenuation in the deep white matter diffusely. This is unchanged from the prior study. There is proportional dilatation of the ventricles. There is no evidence of abnormality involving the calvarium and there is no significant inflammatory change in the sinuses.  In the right pontomedullary junction there is a 14 x 22 mm oval focus of hyperattenuation that was not present previously. There is no extra-axial hemorrhage. There is no evidence of vascular territory ischemic infarct or mass. Atherosclerotic calcification of the intracranial vessels is again noted.  IMPRESSION: Superimposed on chronic hypertensive involutional change, there is a 22 x 14 mm hyper attenuating focus in the right pontomedullary junction most consistent with acute hemorrhage. Critical Value/emergent results were called by telephone at the time of interpretation on 06/11/2014 at 5:22 pm to Dr. Ezequiel Essex , who verbally acknowledged these results.   Electronically Signed   By: Skipper Cliche M.D.   On: 06/11/2014 17:22    Assessment: 80 y.o. male presents with difficulty walking, difficulty speech, left-sided weakness for the past 4 days. CT brain revealed a right pontomedullary  hematoma measuring 14 x 22 mm. 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 Patient has preserved mental status and this hemorrhage is probably 22 or 8 days old. INR 2.10, thus will d/c coumadin and reverse anticoagulation. BP control, goal SBP<140. Follow up CT brain in am or before if changes in neurological status. Stroke team will follow up in the morning.   Dorian Pod, MD [facility] [phone]  06/11/2014, 6:25 PM

## 2014-06-11 NOTE — ED Notes (Signed)
IV team at bedside, RN unable to obtain IV access

## 2014-06-12 ENCOUNTER — Inpatient Hospital Stay (HOSPITAL_COMMUNITY): Admission: RE | Admit: 2014-06-12 | Payer: Medicare Other | Source: Ambulatory Visit

## 2014-06-12 ENCOUNTER — Inpatient Hospital Stay (HOSPITAL_COMMUNITY): Payer: Medicare HMO

## 2014-06-12 DIAGNOSIS — I619 Nontraumatic intracerebral hemorrhage, unspecified: Principal | ICD-10-CM

## 2014-06-12 DIAGNOSIS — I10 Essential (primary) hypertension: Secondary | ICD-10-CM

## 2014-06-12 DIAGNOSIS — E1149 Type 2 diabetes mellitus with other diabetic neurological complication: Secondary | ICD-10-CM

## 2014-06-12 DIAGNOSIS — I2510 Atherosclerotic heart disease of native coronary artery without angina pectoris: Secondary | ICD-10-CM

## 2014-06-12 DIAGNOSIS — N185 Chronic kidney disease, stage 5: Secondary | ICD-10-CM

## 2014-06-12 DIAGNOSIS — I4891 Unspecified atrial fibrillation: Secondary | ICD-10-CM

## 2014-06-12 LAB — GLUCOSE, CAPILLARY
GLUCOSE-CAPILLARY: 142 mg/dL — AB (ref 70–99)
Glucose-Capillary: 153 mg/dL — ABNORMAL HIGH (ref 70–99)
Glucose-Capillary: 154 mg/dL — ABNORMAL HIGH (ref 70–99)
Glucose-Capillary: 96 mg/dL (ref 70–99)

## 2014-06-12 LAB — BASIC METABOLIC PANEL
Anion gap: 13 (ref 5–15)
BUN: 34 mg/dL — ABNORMAL HIGH (ref 6–23)
CO2: 23 mEq/L (ref 19–32)
Calcium: 10.1 mg/dL (ref 8.4–10.5)
Chloride: 107 mEq/L (ref 96–112)
Creatinine, Ser: 2.52 mg/dL — ABNORMAL HIGH (ref 0.50–1.35)
GFR calc Af Amer: 26 mL/min — ABNORMAL LOW (ref >90)
GFR, EST NON AFRICAN AMERICAN: 22 mL/min — AB (ref >90)
Glucose, Bld: 137 mg/dL — ABNORMAL HIGH (ref 70–99)
POTASSIUM: 3.8 meq/L (ref 3.7–5.3)
SODIUM: 143 meq/L (ref 137–147)

## 2014-06-12 LAB — PROTIME-INR
INR: 1.7 — ABNORMAL HIGH (ref 0.00–1.49)
PROTHROMBIN TIME: 20 s — AB (ref 11.6–15.2)

## 2014-06-12 LAB — CBC
HCT: 32.4 % — ABNORMAL LOW (ref 39.0–52.0)
HEMOGLOBIN: 10.7 g/dL — AB (ref 13.0–17.0)
MCH: 27.7 pg (ref 26.0–34.0)
MCHC: 33 g/dL (ref 30.0–36.0)
MCV: 83.9 fL (ref 78.0–100.0)
Platelets: 80 10*3/uL — ABNORMAL LOW (ref 150–400)
RBC: 3.86 MIL/uL — ABNORMAL LOW (ref 4.22–5.81)
RDW: 17.3 % — ABNORMAL HIGH (ref 11.5–15.5)
WBC: 3.8 10*3/uL — ABNORMAL LOW (ref 4.0–10.5)

## 2014-06-12 LAB — HEMOGLOBIN A1C
HEMOGLOBIN A1C: 6.7 % — AB (ref <5.7)
Mean Plasma Glucose: 146 mg/dL — ABNORMAL HIGH (ref <117)

## 2014-06-12 LAB — MRSA PCR SCREENING: MRSA by PCR: NEGATIVE

## 2014-06-12 MED ORDER — FUROSEMIDE 80 MG PO TABS
80.0000 mg | ORAL_TABLET | Freq: Every day | ORAL | Status: DC
  Administered 2014-06-12 – 2014-06-14 (×3): 80 mg via ORAL
  Filled 2014-06-12 (×3): qty 1

## 2014-06-12 MED ORDER — POTASSIUM CHLORIDE CRYS ER 20 MEQ PO TBCR
20.0000 meq | EXTENDED_RELEASE_TABLET | Freq: Two times a day (BID) | ORAL | Status: DC
  Administered 2014-06-12 – 2014-06-14 (×4): 20 meq via ORAL
  Filled 2014-06-12 (×4): qty 1

## 2014-06-12 MED ORDER — LINAGLIPTIN 5 MG PO TABS
5.0000 mg | ORAL_TABLET | Freq: Every day | ORAL | Status: DC
  Administered 2014-06-12 – 2014-06-14 (×3): 5 mg via ORAL
  Filled 2014-06-12 (×3): qty 1

## 2014-06-12 MED ORDER — LABETALOL HCL 5 MG/ML IV SOLN
10.0000 mg | INTRAVENOUS | Status: DC | PRN
  Administered 2014-06-12 – 2014-06-14 (×3): 20 mg via INTRAVENOUS
  Filled 2014-06-12 (×3): qty 4

## 2014-06-12 MED ORDER — AMLODIPINE BESYLATE 10 MG PO TABS
10.0000 mg | ORAL_TABLET | Freq: Every day | ORAL | Status: DC
  Administered 2014-06-12 – 2014-06-14 (×3): 10 mg via ORAL
  Filled 2014-06-12 (×3): qty 1

## 2014-06-12 MED ORDER — INFLUENZA VAC SPLIT QUAD 0.5 ML IM SUSY
0.5000 mL | PREFILLED_SYRINGE | INTRAMUSCULAR | Status: AC
  Administered 2014-06-13: 0.5 mL via INTRAMUSCULAR
  Filled 2014-06-12: qty 0.5

## 2014-06-12 MED ORDER — HEPARIN SODIUM (PORCINE) 5000 UNIT/ML IJ SOLN
5000.0000 [IU] | Freq: Two times a day (BID) | INTRAMUSCULAR | Status: DC
  Administered 2014-06-13 – 2014-06-14 (×2): 5000 [IU] via SUBCUTANEOUS
  Filled 2014-06-12 (×3): qty 1

## 2014-06-12 MED ORDER — ATORVASTATIN CALCIUM 10 MG PO TABS
20.0000 mg | ORAL_TABLET | Freq: Every day | ORAL | Status: DC
  Administered 2014-06-13 – 2014-06-14 (×2): 20 mg via ORAL
  Filled 2014-06-12 (×2): qty 2

## 2014-06-12 MED ORDER — PANTOPRAZOLE SODIUM 40 MG PO TBEC
40.0000 mg | DELAYED_RELEASE_TABLET | Freq: Every day | ORAL | Status: DC
Start: 2014-06-12 — End: 2014-06-14
  Administered 2014-06-12 – 2014-06-14 (×3): 40 mg via ORAL
  Filled 2014-06-12 (×3): qty 1

## 2014-06-12 MED ORDER — CARVEDILOL 6.25 MG PO TABS
6.2500 mg | ORAL_TABLET | Freq: Two times a day (BID) | ORAL | Status: DC
  Administered 2014-06-12 – 2014-06-14 (×4): 6.25 mg via ORAL
  Filled 2014-06-12 (×4): qty 1

## 2014-06-12 NOTE — Evaluation (Signed)
Physical Therapy Evaluation Patient Details Name: [name] MRN: [id number] DOB: [birth year] Today's Date: 06/12/2014   History of Present Illness  Pt presents with L sided weakness and dysarthric speech s/p R pontomedullary hemorrhage starting 4 days prior to admission. Pt with hx of CAD, CHF, a-fib, HTN, and DM.   Clinical Impression  PTA, pt independent with all ADLs and functional mobility. Pt currently requires mod A with cueing for bed mobility. Assessment of other functional mobility tasks unable to be completed 2/2 BP >623 systolic sitting EOB. Pt with cognitive impairments and is not safe for d/c home. Pt good candidate for CIR. Pt would benefit from skilled PT to address deficits noted below and facilitate d/c to CIR.    Follow Up Recommendations CIR    Equipment Recommendations  Other (comment) (TBD)    Recommendations for Other Services Rehab consult     Precautions / Restrictions Precautions Precautions: Fall      Mobility  Bed Mobility Overal bed mobility: Needs Assistance Bed Mobility: Supine to Sit;Sit to Supine     Supine to sit: Mod assist (with verbal cues) Sit to supine: Max assist (verbal cueing)   General bed mobility comments: Pt demonstrates diffifulty with sequencing of task and requires mulitmodal cueing and physical assistance for supine to sit.   Transfers                    Ambulation/Gait                Stairs            Wheelchair Mobility    Modified Rankin (Stroke Patients Only)       Balance Overall balance assessment: Needs assistance Sitting-balance support: Feet supported Sitting balance-Leahy Scale: Fair                                       Pertinent Vitals/Pain      Home Living Family/patient expects to be discharged to:: Private residence Living Arrangements: Children Available Help at Discharge: Family Type of Home: House Home Access: Level entry     [place]: Two  level;Able to live on main level with bedroom/bathroom Home Equipment: None      Prior Function Level of Independence: Independent               Hand Dominance        Extremity/Trunk Assessment   Upper Extremity Assessment: Defer to OT evaluation           Lower Extremity Assessment: Generalized weakness (R stronger than L. Unable to formally assess 2/2 cognition)         Communication   Communication: Other (comment) (pt speech becomes dysarthric when lying flat)  Cognition Arousal/Alertness: Awake/alert Behavior During Therapy: WFL for tasks assessed/performed Overall Cognitive Status: Impaired/Different from baseline Area of Impairment: Attention;Following commands;Safety/judgement;Awareness;Problem solving   Current Attention Level: Selective Memory: Decreased short-term memory Following Commands: Follows one step commands inconsistently;Follows one step commands with increased time Safety/Judgement: Decreased awareness of safety Awareness: Intellectual (Reduced awareness of deficits and problems ) Problem Solving: Slow processing;Decreased initiation;Difficulty sequencing;Requires verbal cues;Requires tactile cues      General Comments General comments (skin integrity, edema, etc.): BP elevated to 177/74 with sitting EOB. pt return to supine immediately, BP 180/74 with HOB elevated. When pt lying flat, pt demonstrates changes in speech production and becomes very difficutly to understand.  pt demonstrates difficulty following single step commands during strength testing.     Exercises        Assessment/Plan    PT Assessment Patient needs continued PT services  PT Diagnosis     PT Problem List Decreased activity tolerance;Decreased balance;Decreased coordination;Decreased mobility  PT Treatment Interventions DME instruction;Gait training;Functional mobility training;Therapeutic activities;Therapeutic exercise;Balance training;Neuromuscular  re-education;Patient/family education   PT Goals (Current goals can be found in the Care Plan section) Acute Rehab PT Goals Patient Stated Goal: Not to go to a nursing home PT Goal Formulation: With patient Time For Goal Achievement: 06/26/14 Potential to Achieve Goals: Good    Frequency Min 3X/week   Barriers to discharge        Co-evaluation PT/OT/SLP Co-Evaluation/Treatment: No            End of Session   Activity Tolerance: Treatment limited secondary to medical complications (Comment) (BP 177/74 sitting EOB; 180/84 supine with HOB elevated) Patient left: in bed;with call bell/phone within reach Nurse Communication: Other (comment) (BP changes)         Time: 0354-6568 PT Time Calculation (min): 22 min   Charges:         PT G Codes:          Virgina Deakins 06/12/2014, 4:15 PM Levonne Hubert, SPT

## 2014-06-12 NOTE — Evaluation (Signed)
Reviewed and agree with assessment and plan of care.  Alben Deeds, [place] DPT  [phone]

## 2014-06-12 NOTE — Progress Notes (Signed)
[facility] NOTE   HISTORY [name] is an 80 y.o. male with PMH significant for HTN, hyperlipidemia, DM, CAD s/p CABG, atrial flutter on coumadin, CKD, MGUS, brought in by family for further evaluation of the above stated symptoms.  Patient stated that he started having " weakness in my legs, mainly the left leg and also in my left arm" several days ago which impeded normal walking to the point that he had a fall in which did not injured his head. [name] addition, his son said that he has been noticing slurred speech and droopiness of his left face since this past Friday.  Denies HA, vertigo, double vision, difficulty swallowing, confusion, or visual disturbances.  CT brain today showed a right pontomedullary hematoma measuring 14 x 22. Neurosurgery was consulted by the ED and not aggressive neurosurgical intervention was recommended.  INR 2.10  SBP 160 in the ED and received 10 mg IV labetalol.  Date last known well: unclear  Time last known well: unclear  tPA Given: no, ICH  SUBJECTIVE (INTERVAL HISTORY) No family is at the bedside.  Overall he feels his condition is stable. He was eating breakfast during rounds. INR this morning is 1.7 after reversal yesterday. Repeat CT head showed no expansion of hematoma.  OBJECTIVE Temp:  [97.6 F (36.4 C)-99.2 F (37.3 C)] 97.6 F (36.4 C) (09/16 1820) Pulse Rate:  [42-77] 60 (09/16 1820) Cardiac Rhythm:  [-] Atrial fibrillation (09/15 2123) Resp:  [10-36] 18 (09/16 1820) BP: (132-180)/(52-97) 151/69 mmHg (09/16 1820) SpO2:  [88 %-100 %] 94 % (09/16 1820) Weight:  [178 lb 14.4 oz (81.149 kg)] 178 lb 14.4 oz (81.149 kg) (09/15 2123)   Recent Labs Lab 06/11/14 1541 06/12/14 0747 06/12/14 1128 06/12/14 1654  GLUCAP 168* 153* 154* 96    Recent Labs Lab 06/11/14 1407 06/12/14 0910  NA 142 143  K 4.0 3.8  CL 106 107  CO2 21 23  GLUCOSE 147* 137*  BUN 35* 34*  CREATININE 2.47* 2.52*  CALCIUM 9.9 10.1    Recent  Labs Lab 06/11/14 1407  AST 20  ALT 11  ALKPHOS 103  BILITOT 0.7  PROT 8.2  ALBUMIN 3.9    Recent Labs Lab 06/11/14 1407 06/12/14 0910  WBC 4.2 3.8*  NEUTROABS 2.9  --   HGB 11.8* 10.7*  HCT 35.9* 32.4*  MCV 86.3 83.9  PLT 112* 80*   No results found for this basename: CKTOTAL, CKMB, CKMBINDEX, TROPONINI,  in the last 168 hours  Recent Labs  06/11/14 1407 06/12/14 0910  LABPROT 23.6* 20.0*  INR 2.10* 1.70*   No results found for this basename: COLORURINE, APPERANCEUR, LABSPEC, PHURINE, GLUCOSEU, HGBUR, BILIRUBINUR, KETONESUR, PROTEINUR, UROBILINOGEN, NITRITE, LEUKOCYTESUR,  in the last 72 hours     Component Value Date/Time   CHOL 79 09/28/2013 1238   TRIG 80.0 09/28/2013 1238   HDL 23.50* 09/28/2013 1238   CHOLHDL 3 09/28/2013 1238   VLDL 16.0 09/28/2013 1238   LDLCALC 40 09/28/2013 1238   Lab Results  Component Value Date   HGBA1C 6.7* 06/11/2014   No results found for this basename: labopia, cocainscrnur, labbenz, amphetmu, thcu, labbarb    No results found for this basename: ETH,  in the last 168 hours  Ct Head Wo Contrast  06/12/2014   IMPRESSION: 1. The known hemorrhagic focus within the anterior and right aspect of the pons has not increased in size and is in fact slightly less conspicuous today. No intracranial hemorrhage otherwise is  demonstrated. 2. There are stable changes of chronic small vessel ischemia and diffuse atrophy.      Ct Head (brain) Wo Contrast  06/11/2014   IMPRESSION: Superimposed on chronic hypertensive involutional change, there is a 22 x 14 mm hyper attenuating focus in the right pontomedullary junction most consistent with acute hemorrhage.    2D ehco - pending  CUS - 02/2014 -  Heterogeneous plaque, bilaterally. Stable, 40-59% RICA stenosis. Stable, 82-50% LICA stenosis. Occluded right vertebral artery. Patent left vertebral artery with antegrade flow. Normal subclavian arteries, bilaterally.   PHYSICAL EXAM  Temp:  [97.6 F (36.4  C)-99.2 F (37.3 C)] 97.6 F (36.4 C) (09/16 1820) Pulse Rate:  [42-77] 60 (09/16 1820) Resp:  [10-36] 18 (09/16 1820) BP: (132-180)/(52-97) 151/69 mmHg (09/16 1820) SpO2:  [88 %-100 %] 94 % (09/16 1820) Weight:  [178 lb 14.4 oz (81.149 kg)] 178 lb 14.4 oz (81.149 kg) (09/15 2123)  General - Well nourished, well developed, in no apparent distress.  Ophthalmologic - not able to see through.  Cardiovascular - Regular rate and rhythm with no murmur.  Mental Status -  Level of arousal and orientation to time, place, and person were intact. Language including expression, naming, repetition, comprehension was assessed and found intact, but mildly dysarthric.  Cranial Nerves II - XII - II - Visual field intact OU. III, IV, VI - Extraocular movements intact. V - Facial sensation intact bilaterally. VII - left facial droop. VIII - Hearing & vestibular intact bilaterally. X - Palate elevates symmetrically, but mildly dysarthric. XI - Chin turning & shoulder shrug intact bilaterally. XII - Tongue protrusion intact.  Motor Strength - The patient's strength was LUE 3/5 proximal and 4/5 distal, LLE 3/5, RUE and RLE 5/5 and pronator drift was present.  Bulk was normal and fasciculations were absent.   Motor Tone - Muscle tone was assessed at the neck and appendages and was normal.  Reflexes - The patient's reflexes were 1+ in all extremities and he had left babinski positive.  Sensory - Light touch, temperature/pinprick were assessed and were normal.    Coordination - The patient had normal movements in the hands with no ataxia or dysmetria.  Tremor was absent.  Gait and Station - not tested.   ASSESSMENT/PLAN  [name] is a 80 y.o. male with history of afib on coumadin, HTN, HLD, DM, CAD s/p CABG, CKD presenting with left sided weakness for a week. He did not receive IV t-PA due to [place]. Imaging confirms a right pontomedullary ICH. INR 2.1 and he received kcentra reversal.  This am INR 1.7. Repeat CT stable hematoma.   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 secondary to anticoagulation use     warfarin prior to admission, now on no antithrombotics  2D Echo  pending   LDL pending  HgbA1c 6.7 borderline  SCDs for VTE prophylaxis  Carb Control    Activity as tolerated  Resultant left hemiparesis  Therapy needs:  CIR  Ongoing aggressive risk factor management  Risk factor education  Patient counseled to be compliant with his antithrombotic medications  Disposition:  CIR  Afib on coumadin - INR 1.7 after reversal - coumadin on hold due to bleeding - monitor INR daily - continue coreg  Carotid stenosis - asymptomatic so far - repeat CUS this tome - follow up as outpt  Hypertension   Home meds:  Norvasc, coreg, lasix. Resumed in hospital  BP 132-176 past 24h (06/12/2014 @ 6:42 PM)  SBP goal <  160  Stable  Patient counseled to be compliant with his blood pressure medications  Hyperlipidemia  Home meds:  lipitor. Resumed in hospital  LDL pending   LDL goal <70 for diabetics  Diabetes  Home meds:  levemir and saxagliptin  HgbA1c 6.7   Controlled  Goal < 7.0  On SSI  educated patient about lifestyle changes for diabetes treatment  Other Stroke Risk Factors Advanced age CKD CAD s/p CABG   Hospital day # 1  Parker Hawking, MD PhD Stroke Neurology 06/12/2014 7:05 PM      To contact Stroke Continuity provider, please refer to [email or web address] After hours, contact General Neurology

## 2014-06-12 NOTE — Progress Notes (Signed)
Rehab Admissions Coordinator Note:  Patient was screened by Cleatrice Burke for appropriateness for an Inpatient Acute Rehab Consult.  At this time, we are recommending Inpatient Rehab consult.  Cleatrice Burke 06/12/2014, 1:45 PM  I can be reached at [phone].

## 2014-06-12 NOTE — Consult Note (Signed)
Physical Medicine and Rehabilitation Consult Reason for Consult: Right pontomedullary hematoma Referring Physician: Dr.Xu   HPI: [name] is a 80 y.o. right-handed male with history of CAD status post CABG, atrial flutter on chronic Coumadin, chronic renal insufficiency with baseline creatinine 9.32, chronic diastolic congestive heart failure. Independent with occasional cane and driving living with family. Admitted 06/11/2049 of left-sided weakness and slurred speech as well as recent fall but denies striking his head. Cranial CT scan shows a right pontomedullary 14 x 22 mm oval focus consistent with acute hemorrhage. INR on admission of 6.71 and systolic blood pressure 245. Placed on intravenous Lopressor and Coumadin was reversed. Followup cranial CT scan unchanged. 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 Tolerating a regular consistency diet. Physical therapy evaluation completed 06/12/2014 with recommendations of physical medicine and rehabilitation consult.   Review of Systems  Cardiovascular: Positive for palpitations.  Gastrointestinal:       GERD  Musculoskeletal: Positive for myalgias.  Neurological: Positive for speech change and weakness.   Past Medical History  Diagnosis Date  . CAD (coronary artery disease)     s/p 3v CABG 2006, myoview 04/2010 EF 49%, prior inferior/apical infarct, no ischemia, LHC 11/2011 stable anatomy (occluded LAD filled from vein graft, distal LCx occluded prior to OM2, OM2 filled from vein graft, RCA prox 25%, mid 40%, distal 25% lesions, SVG to diagonal occluded) Med Rx  . Chronic diastolic heart failure     hx of cardiorenal syndrome;  Echo 06/2012 EF 80-99%, mod diastolic dysfunction, mild MR, mod TR, mild R/LAE, mild RV dilatation, PASP 72mmHg.  . Carotid stenosis     a. Carotid dopplers R 40-59%, Left 60-79%;   b. carotids 12/13:  8-33% RICA, 82-50% LICA (rpt in 6 mos);  c.  Carotid US (11/14):  R 40-59%; L  60-79%, R vertebral occluded, L vertebral antegrade  . HLD (hyperlipidemia)   . Mediastinal adenopathy     per CT chest in 2006 with PET scan showing very limited metabolic activity; not felt to have a significant neoplastic potential  . Iron deficiency anemia   . Hypertension   . Diabetes mellitus     insulin dependent   . GERD (gastroesophageal reflux disease)   . AV block, Mobitz 1     Noted 11/2011 in hospital, BB stopped  . Colon polyps   . Diverticulosis   . Pulmonary arterial hypertension     a.  RHC 11/1 PASP 60 mmHg (mean 38); b. RHC 11/15 PA pressure 66/24, PCWP 26 and CO 6.2 => Sildenafil started;   c. Echo bubble study 11/13:  no obvious shunt;   d. VQ scan neg for pulmonary embolism  . CKD (chronic kidney disease) stage 5, GFR less than 15 ml/min     hx of cardiorenal syndrome;  Dr Posey Pronto;  AVF recommended and scheduled (patient hesitant to proceed);   Renal US 11/13: diff echogenic kidneys c/w medical renal disease; no hydronephrosis or renal mass  . Hyperparathyroidism, secondary renal   . MGUS (monoclonal gammopathy of unknown significance)     a. Kappa/Lambda free light chain ratio 11/13:  23.99;  b. Met. Bone survey 11/13:  no osteolytic lesions to suggest MMyeloma  . Atrial flutter     diagnosed 05/2013; Coumadin initiated   Past Surgical History  Procedure Laterality Date  . Appendectomy    . Coronary artery bypass graft  2006    SVG to OM2, SVG to LAD, SVG to DX; (  the LIMA did not have good flow and therefore was not used)  . Cardiac catheterization  12/21/2011   Family History  Problem Relation Age of Onset  . Stomach cancer Sister   . Pancreatic cancer Brother   . Lung cancer Brother   . Colon cancer Neg Hx    Social History:  reports that he quit smoking about 60 years ago. He has never used smokeless tobacco. He reports that he does not drink alcohol or use illicit drugs. Allergies: No Known Allergies Medications Prior to Admission  Medication Sig Dispense  Refill  . ADVOCATE LANCETS MISC       . Alcohol Swabs (ALCOHOL PREP) 70 % PADS       . amLODipine (NORVASC) 10 MG tablet Take 10 mg by mouth daily.      Marland Kitchen atorvastatin (LIPITOR) 20 MG tablet take 1 tablet by mouth at bedtime  30 tablet  3  . Blood Glucose Calibration (OT ULTRA/FASTTK CNTRL SOLN) SOLN       . carvedilol (COREG) 6.25 MG tablet Take 1 tablet (6.25 mg total) by mouth 2 (two) times daily.  180 tablet  3  . furosemide (LASIX) 80 MG tablet Take 80 mg by mouth daily.      . insulin detemir (LEVEMIR) 100 UNIT/ML injection Inject 0.15 mLs (15 Units total) into the skin at bedtime.  10 mL  2  . Insulin Pen Needle (NOVOFINE) 30G X 8 MM MISC Use as directed once daily with insulin DUE FOR LABS  30 each  0  . Lancet Devices (ADVOCATE RAPID-SAFE LANCING) MISC       . ONE TOUCH ULTRA TEST test strip       . potassium chloride SA (K-DUR,KLOR-CON) 20 MEQ tablet Take 20 mEq by mouth 2 (two) times daily.      . saxagliptin HCl (ONGLYZA) 2.5 MG TABS tablet Take 1 tablet (2.5 mg total) by mouth daily.  30 tablet  11  . Vitamin D, Ergocalciferol, (DRISDOL) 50000 UNITS CAPS capsule Take 50,000 Units by mouth every 7 (seven) days.      Marland Kitchen warfarin (COUMADIN) 6 MG tablet Take 1 tablet (6 mg total) by mouth as directed.  40 tablet  3    Home: Home Living Family/patient expects to be discharged to:: Private residence Living Arrangements: Children Available Help at Discharge: Family Type of Home: House Home Access: Level entry Home Layout: Two level;Able to live on main level with bedroom/bathroom Home Equipment: None  Functional History: Prior Function Level of Independence: Independent Functional Status:  Mobility: Bed Mobility Overal bed mobility: Needs Assistance Bed Mobility: Supine to Sit;Sit to Supine Supine to sit: Mod assist (with verbal cues) Sit to supine: Mod assist (verbal cueing) General bed mobility comments: Pt demonstrates diffifulty with sequencing of task and requires  mulitmodal cueing and physical assistance for supine to sit. Pt requires less cueing for return to supine.         ADL:    Cognition: Cognition Overall Cognitive Status: No family/caregiver present to determine baseline cognitive functioning Arousal/Alertness: Awake/alert Orientation Level: Oriented to person;Oriented to place;Oriented to situation;Disoriented to time (2013) Attention: Sustained Sustained Attention: Appears intact Memory: Appears intact Awareness: Impaired Awareness Impairment: Anticipatory impairment Problem Solving: Impaired Problem Solving Impairment: Functional basic Cognition Arousal/Alertness: Awake/alert Behavior During Therapy: WFL for tasks assessed/performed Overall Cognitive Status: No family/caregiver present to determine baseline cognitive functioning  Blood pressure 178/71, pulse 65, temperature 98 F (36.7 C), temperature source Oral, resp. rate 20, height 5'  11" (1.803 m), weight 81.149 kg (178 lb 14.4 oz), SpO2 100.00%. Physical Exam  HENT:  Head: Normocephalic.  Eyes: EOM are normal.  Neck: Normal range of motion. Neck supple. No thyromegaly present.  Cardiovascular:  Cardiac rate control  Respiratory: Effort normal and breath sounds normal. No respiratory distress.  GI: Soft. Bowel sounds are normal. He exhibits no distension.  Neurological: He is alert.  Speech is dysarthric but intelligible. He makes good eye contact with examiner. He was able to provide his age and date of birth. Follows simple commands. Left sided weakness noted.   Skin: Skin is warm and dry.  Psychiatric: He has a normal mood and affect.    Results for orders placed during the hospital encounter of 06/11/14 (from the past 24 hour(s))  PROTIME-INR     Status: Abnormal   Collection Time    06/11/14  2:07 PM      Result Value Ref Range   Prothrombin Time 23.6 (*) 11.6 - 15.2 seconds   INR 2.10 (*) 0.00 - 1.49  APTT     Status: None   Collection Time    06/11/14   2:07 PM      Result Value Ref Range   aPTT 36  24 - 37 seconds  CBC     Status: Abnormal   Collection Time    06/11/14  2:07 PM      Result Value Ref Range   WBC 4.2  4.0 - 10.5 K/uL   RBC 4.16 (*) 4.22 - 5.81 MIL/uL   Hemoglobin 11.8 (*) 13.0 - 17.0 g/dL   HCT 35.9 (*) 39.0 - 52.0 %   MCV 86.3  78.0 - 100.0 fL   MCH 28.4  26.0 - 34.0 pg   MCHC 32.9  30.0 - 36.0 g/dL   RDW 17.3 (*) 11.5 - 15.5 %   Platelets 112 (*) 150 - 400 K/uL  DIFFERENTIAL     Status: None   Collection Time    06/11/14  2:07 PM      Result Value Ref Range   Neutrophils Relative % 68  43 - 77 %   Neutro Abs 2.9  1.7 - 7.7 K/uL   Lymphocytes Relative 26  12 - 46 %   Lymphs Abs 1.1  0.7 - 4.0 K/uL   Monocytes Relative 6  3 - 12 %   Monocytes Absolute 0.2  0.1 - 1.0 K/uL   Eosinophils Relative 1  0 - 5 %   Eosinophils Absolute 0.0  0.0 - 0.7 K/uL   Basophils Relative 0  0 - 1 %   Basophils Absolute 0.0  0.0 - 0.1 K/uL  COMPREHENSIVE METABOLIC PANEL     Status: Abnormal   Collection Time    06/11/14  2:07 PM      Result Value Ref Range   Sodium 142  137 - 147 mEq/L   Potassium 4.0  3.7 - 5.3 mEq/L   Chloride 106  96 - 112 mEq/L   CO2 21  19 - 32 mEq/L   Glucose, Bld 147 (*) 70 - 99 mg/dL   BUN 35 (*) 6 - 23 mg/dL   Creatinine, Ser 2.47 (*) 0.50 - 1.35 mg/dL   Calcium 9.9  8.4 - 10.5 mg/dL   Total Protein 8.2  6.0 - 8.3 g/dL   Albumin 3.9  3.5 - 5.2 g/dL   AST 20  0 - 37 U/L   ALT 11  0 - 53 U/L   Alkaline  Phosphatase 103  39 - 117 U/L   Total Bilirubin 0.7  0.3 - 1.2 mg/dL   GFR calc non Af Amer 23 (*) >90 mL/min   GFR calc Af Amer 27 (*) >90 mL/min   Anion gap 15  5 - 15  HEMOGLOBIN A1C     Status: Abnormal   Collection Time    06/11/14  2:07 PM      Result Value Ref Range   Hemoglobin A1C 6.7 (*) <5.7 %   Mean Plasma Glucose 146 (*) <117 mg/dL  I-STAT TROPOININ, ED     Status: None   Collection Time    06/11/14  3:05 PM      Result Value Ref Range   Troponin i, poc 0.02  0.00 - 0.08 ng/mL    Comment 3           CBG MONITORING, ED     Status: Abnormal   Collection Time    06/11/14  3:41 PM      Result Value Ref Range   Glucose-Capillary 168 (*) 70 - 99 mg/dL   Comment 1 Documented in Chart     Comment 2 Notify RN    PREPARE FRESH FROZEN PLASMA     Status: None   Collection Time    06/11/14  7:00 PM      Result Value Ref Range   Unit Number [id number]     Blood Component Type THAWED PLASMA     Unit division 00     Status of Unit ISSUED,FINAL     Transfusion Status OK TO TRANSFUSE     Unit Number [id number]     Blood Component Type THAWED PLASMA     Unit division 00     Status of Unit ISSUED,FINAL     Transfusion Status OK TO TRANSFUSE     Unit Number [id number]     Blood Component Type THAWED PLASMA     Unit division 00     Status of Unit ISSUED,FINAL     Transfusion Status OK TO TRANSFUSE     Unit Number [id number]     Blood Component Type THAWED PLASMA     Unit division 00     Status of Unit ISSUED     Transfusion Status OK TO TRANSFUSE    MRSA PCR SCREENING     Status: None   Collection Time    06/11/14  9:31 PM      Result Value Ref Range   MRSA by PCR NEGATIVE  NEGATIVE  GLUCOSE, CAPILLARY     Status: Abnormal   Collection Time    06/12/14  7:47 AM      Result Value Ref Range   Glucose-Capillary 153 (*) 70 - 99 mg/dL   Comment 1 Notify RN     Comment 2 Documented in Chart    CBC     Status: Abnormal   Collection Time    06/12/14  9:10 AM      Result Value Ref Range   WBC 3.8 (*) 4.0 - 10.5 K/uL   RBC 3.86 (*) 4.22 - 5.81 MIL/uL   Hemoglobin 10.7 (*) 13.0 - 17.0 g/dL   HCT 97.9 (*) 53.6 - 92.2 %   MCV 83.9  78.0 - 100.0 fL   MCH 27.7  26.0 - 34.0 pg   MCHC 33.0  30.0 - 36.0 g/dL   RDW 30.0 (*) 97.9 - 49.9 %   Platelets 80 (*) 150 - 400 K/uL  BASIC METABOLIC  PANEL     Status: Abnormal   Collection Time    06/12/14  9:10 AM      Result Value Ref Range   Sodium 143  137 - 147 mEq/L   Potassium 3.8  3.7 - 5.3 mEq/L   Chloride  107  96 - 112 mEq/L   CO2 23  19 - 32 mEq/L   Glucose, Bld 137 (*) 70 - 99 mg/dL   BUN 34 (*) 6 - 23 mg/dL   Creatinine, Ser 2.52 (*) 0.50 - 1.35 mg/dL   Calcium 10.1  8.4 - 10.5 mg/dL   GFR calc non Af Amer 22 (*) >90 mL/min   GFR calc Af Amer 26 (*) >90 mL/min   Anion gap 13  5 - 15  PROTIME-INR     Status: Abnormal   Collection Time    06/12/14  9:10 AM      Result Value Ref Range   Prothrombin Time 20.0 (*) 11.6 - 15.2 seconds   INR 1.70 (*) 0.00 - 1.49  GLUCOSE, CAPILLARY     Status: Abnormal   Collection Time    06/12/14 11:28 AM      Result Value Ref Range   Glucose-Capillary 154 (*) 70 - 99 mg/dL   Comment 1 Notify RN     Comment 2 Documented in Chart     Ct Head Wo Contrast  06/12/2014   CLINICAL DATA:  Reassess known intracranial hemorrhage  EXAM: CT HEAD WITHOUT CONTRAST  TECHNIQUE: Contiguous axial images were obtained from the base of the skull through the vertex without intravenous contrast.  COMPARISON:  Noncontrast CT scan of the brain dated 11 June 2014  FINDINGS: Again demonstrated is a hyperdense focus in the right anterior and mid pons. It measures 1.7 cm transversely x 0.9 cm AP and is slightly smaller overall in appearance since yesterday's study. There is no subarachnoid blood. There is no hemorrhagic focus elsewhere within the brain.  There is mild stable diffuse cerebral and cerebellar atrophy with compensatory ventriculomegaly. Stable decreased density in the deep white matter of both cerebral hemispheres is consistent with chronic small vessel ischemic change. Mild encephalomalacia in the posterior parietal deep white matter and adjacent cortex is stable. The cerebellum exhibits no acute abnormality.  The calvarium is intact. The observed paranasal sinuses and mastoid air cells are clear.  IMPRESSION: 1. The known hemorrhagic focus within the anterior and right aspect of the pons has not increased in size and is in fact slightly less conspicuous today. No  intracranial hemorrhage otherwise is demonstrated. 2. There are stable changes of chronic small vessel ischemia and diffuse atrophy.   Electronically Signed   By: David  Martinique   On: 06/12/2014 08:39   Ct Head (brain) Wo Contrast  06/11/2014   CLINICAL DATA:  Stroke like symptoms trauma slurred speech and leg weakness  EXAM: CT HEAD WITHOUT CONTRAST  TECHNIQUE: Contiguous axial images were obtained from the base of the skull through the vertex without intravenous contrast.  COMPARISON:  06/28/2013, 06/30/2013  FINDINGS: There is severe diffuse atrophy and low attenuation in the deep white matter diffusely. This is unchanged from the prior study. There is proportional dilatation of the ventricles. There is no evidence of abnormality involving the calvarium and there is no significant inflammatory change in the sinuses.  In the right pontomedullary junction there is a 14 x 22 mm oval focus of hyperattenuation that was not present previously. There is no extra-axial hemorrhage. There is no evidence  of vascular territory ischemic infarct or mass. Atherosclerotic calcification of the intracranial vessels is again noted.  IMPRESSION: Superimposed on chronic hypertensive involutional change, there is a 22 x 14 mm hyper attenuating focus in the right pontomedullary junction most consistent with acute hemorrhage. Critical Value/emergent results were called by telephone at the time of interpretation on 06/11/2014 at 5:22 pm to Dr. Ezequiel Essex , who verbally acknowledged these results.   Electronically Signed   By: Skipper Cliche M.D.   On: 06/11/2014 17:22    Assessment/Plan: Diagnosis: right ponto-medullary infarct 1. Does the need for close, 24 hr/day medical supervision in concert with the patient's rehab needs make it unreasonable for this patient to be served in a less intensive setting? Yes 2. Co-Morbidities requiring supervision/potential complications: oa, cad 3. Due to bladder management, bowel  management, safety, skin/wound care, disease management, medication administration, pain management and patient education, does the patient require 24 hr/day rehab nursing? Yes 4. Does the patient require coordinated care of a physician, rehab nurse, PT (1-2 hrs/day, 5 days/week), OT (1-2 hrs/day, 5 days/week) and SLP (1-2 hrs/day, 5 days/week) to address physical and functional deficits in the context of the above medical diagnosis(es)? Yes Addressing deficits in the following areas: balance, endurance, locomotion, strength, transferring, bowel/bladder control, bathing, dressing, feeding, grooming and toileting 5. Can the patient actively participate in an intensive therapy program of at least 3 hrs of therapy per day at least 5 days per week? Yes 6. The potential for patient to make measurable gains while on inpatient rehab is excellent 7. Anticipated functional outcomes upon discharge from inpatient rehab are modified independent  with PT, modified independent and supervision with OT, modified independent with SLP. 8. Estimated rehab length of stay to reach the above functional goals is: 14-18 days 9. Does the patient have adequate social supports to accommodate these discharge functional goals? Yes 10. Anticipated D/C setting: Home 11. Anticipated post D/C treatments: HH therapy and Outpatient therapy 12. Overall Rehab/Functional Prognosis: excellent  RECOMMENDATIONS: This patient's condition is appropriate for continued rehabilitative care in the following setting: CIR Patient has agreed to participate in recommended program. Potentially Note that insurance prior authorization may be required for reimbursement for recommended care.  Comment: Rehab Admissions Coordinator to follow up.  Thanks,  Meredith Staggers, MD, Mellody Drown     06/12/2014

## 2014-06-12 NOTE — Evaluation (Addendum)
Speech Language Pathology Evaluation Patient Details Name: LOTUS GOVER MRN: [id number] DOB: 10-11-1930 Today's Date: 06/12/2014 Time: 1050-1110 SLP Time Calculation (min): 20 min  Problem List:  Patient Active Problem List   Diagnosis Date Noted  . Stroke due to intracerebral hemorrhage 06/11/2014  . Atrial fibrillation 11/20/2013  . Chronic diastolic congestive heart failure 11/20/2013  . Atrial flutter 06/25/2013  . Long term (current) use of anticoagulants 06/25/2013  . CKD (chronic kidney disease) stage 5, GFR less than 15 ml/min 08/29/2012  . Pulmonary arterial hypertension 07/29/2012  . Anemia, iron deficiency 01/25/2012  . AV block, Mobitz 1 01/10/2012  . CARDIOMYOPATHY, ISCHEMIC 10/22/2010  . HEADACHE 01/23/2009  . DIABETIC PERIPHERAL NEUROPATHY 07/24/2008  . ABDOMINAL PAIN, RIGHT UPPER QUADRANT 10/23/2007  . DIABETES MELLITUS 06/24/2007  . HYPERLIPIDEMIA 06/24/2007  . CORONARY ARTERY DISEASE 06/24/2007  . OSTEOARTHRITIS 06/24/2007  . CEREBROVASCULAR DISEASE, HX OF 06/24/2007  . STATUS, OTHER TOE(S) AMPUTATION 06/24/2007  . HYPERTENSION 05/13/2007  . CORONARY ARTERY BYPASS GRAFT, HX OF 11/25/1994   Past Medical History:  Past Medical History  Diagnosis Date  . CAD (coronary artery disease)     s/p 3v CABG 2006, myoview 04/2010 EF 49%, prior inferior/apical infarct, no ischemia, LHC 11/2011 stable anatomy (occluded LAD filled from vein graft, distal LCx occluded prior to OM2, OM2 filled from vein graft, RCA prox 25%, mid 40%, distal 25% lesions, SVG to diagonal occluded) Med Rx  . Chronic diastolic heart failure     hx of cardiorenal syndrome;  Echo 06/2012 EF 35-00%, mod diastolic dysfunction, mild MR, mod TR, mild R/LAE, mild RV dilatation, PASP 44mmHg.  . Carotid stenosis     a. Carotid dopplers R 40-59%, Left 60-79%;   b. carotids 12/13:  9-38% RICA, 18-29% LICA (rpt in 6 mos);  c.  Carotid US (11/14):  R 40-59%; L 60-79%, R vertebral occluded, L vertebral  antegrade  . HLD (hyperlipidemia)   . Mediastinal adenopathy     per CT chest in 2006 with PET scan showing very limited metabolic activity; not felt to have a significant neoplastic potential  . Iron deficiency anemia   . Hypertension   . Diabetes mellitus     insulin dependent   . GERD (gastroesophageal reflux disease)   . AV block, Mobitz 1     Noted 11/2011 in hospital, BB stopped  . Colon polyps   . Diverticulosis   . Pulmonary arterial hypertension     a.  RHC 11/1 PASP 60 mmHg (mean 38); b. RHC 11/15 PA pressure 66/24, PCWP 26 and CO 6.2 => Sildenafil started;   c. Echo bubble study 11/13:  no obvious shunt;   d. VQ scan neg for pulmonary embolism  . CKD (chronic kidney disease) stage 5, GFR less than 15 ml/min     hx of cardiorenal syndrome;  Dr Posey Pronto;  AVF recommended and scheduled (patient hesitant to proceed);   Renal US 11/13: diff echogenic kidneys c/w medical renal disease; no hydronephrosis or renal mass  . Hyperparathyroidism, secondary renal   . MGUS (monoclonal gammopathy of unknown significance)     a. Kappa/Lambda free light chain ratio 11/13:  23.99;  b. Met. Bone survey 11/13:  no osteolytic lesions to suggest MMyeloma  . Atrial flutter     diagnosed 05/2013; Coumadin initiated   Past Surgical History:  Past Surgical History  Procedure Laterality Date  . Appendectomy    . Coronary artery bypass graft  2006    SVG to OM2, SVG to  LAD, SVG to DX; (the LIMA did not have good flow and therefore was not used)  . Cardiac catheterization  12/21/2011   HPI:  SELIG WAMPOLE is an 80 y.o. male with PMH significant for HTN, hyperlipidemia, DM, CAD s/p CABG, atrial flutter on coumadin, CKD, MGUS, who was brought in by family for further evaluation of difficulty walking, slurred speech, and left-sided weakness for 4 days. 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   Assessment / Plan /  Recommendation Clinical Impression  Pt demonstrates a mild dysarthria characterized by imprecise articulation, with only mild left-sided facial weakness noted. He also presents with moderate impairments with anticipatory awareness and basic problem solving as observed throughout functional mobility tasks. He is disoriented to time, requiring Mod cues from SLP to determine the correct year. There is no family present to better determine baseline level of function. Pt will benefit from skilled SLP services to maximize functional communication and independence.    SLP Assessment  Patient needs continued Speech Lanaguage Pathology Services    Follow Up Recommendations  Inpatient Rehab    Frequency and Duration min 2x/week  2 weeks   Pertinent Vitals/Pain Pain Assessment: No/denies pain   SLP Goals  Patient/Family Stated Goal: none stated Potential to Achieve Goals: Good  SLP Evaluation Prior Functioning  Cognitive/Linguistic Baseline: Information not available Type of Home: House Available Help at Discharge: Family   Cognition  Overall Cognitive Status: No family/caregiver present to determine baseline cognitive functioning Arousal/Alertness: Awake/alert Orientation Level: Oriented to person;Oriented to place;Oriented to situation;Disoriented to time (2013) Attention: Sustained Sustained Attention: Appears intact Memory: Appears intact Awareness: Impaired Awareness Impairment: Anticipatory impairment Problem Solving: Impaired Problem Solving Impairment: Functional basic    Comprehension  Auditory Comprehension Overall Auditory Comprehension: Appears within functional limits for tasks assessed (with basic, one-step tasks) Visual Recognition/Discrimination Discrimination: Not tested Reading Comprehension Reading Status: Not tested    Expression Expression Primary Mode of Expression: Verbal Verbal Expression Overall Verbal Expression: Appears within functional limits for  tasks assessed Written Expression Written Expression: Not tested   Oral / Motor Oral Motor/Sensory Function Overall Oral Motor/Sensory Function: Impaired Labial ROM: Reduced left Labial Symmetry: Abnormal symmetry left Labial Strength: Reduced Lingual ROM: Within Functional Limits Lingual Symmetry: Within Functional Limits Lingual Strength: Within Functional Limits Facial ROM: Reduced left Facial Symmetry: Left droop Facial Strength: Reduced Velum: Within Functional Limits Mandible: Within Functional Limits Motor Speech Overall Motor Speech: Impaired Respiration: Within functional limits Phonation: Normal Resonance: Within functional limits Articulation: Impaired Level of Impairment: Conversation Intelligibility: Intelligible Motor Planning: Witnin functional limits Motor Speech Errors: Not applicable   GO      Germain Osgood, M.A. CCC-SLP [phone]  Germain Osgood 06/12/2014, 1:26 PM

## 2014-06-12 NOTE — Progress Notes (Signed)
Report received from 3MW RN at 1158 and pt arrived to the floor at 1304. Pt A&O x4; oriented to the room and unit; pt placed on telemetry and called in to CCMD; VSS to be taken; IV saline locked intact; pt denies any pain; skin intact with no pressure ulcer noted; pt in bed comfortably with call light within reach. Will continue to monitor quietly. Delia Heady RN

## 2014-06-12 NOTE — Progress Notes (Signed)
UR completed.  Jaclene Bartelt, RN BSN [facility] CCM Trauma/Neuro ICU Case Manager [phone]  

## 2014-06-13 DIAGNOSIS — I369 Nonrheumatic tricuspid valve disorder, unspecified: Secondary | ICD-10-CM

## 2014-06-13 DIAGNOSIS — I6350 Cerebral infarction due to unspecified occlusion or stenosis of unspecified cerebral artery: Secondary | ICD-10-CM | POA: Diagnosis not present

## 2014-06-13 LAB — LIPID PANEL
Cholesterol: 109 mg/dL (ref 0–200)
HDL: 38 mg/dL — ABNORMAL LOW (ref >39)
LDL Cholesterol: 59 mg/dL (ref 0–99)
Total CHOL/HDL Ratio: 2.9 RATIO
Triglycerides: 61 mg/dL (ref <150)
VLDL: 12 mg/dL (ref 0–40)

## 2014-06-13 LAB — PREPARE FRESH FROZEN PLASMA
Unit division: 0
Unit division: 0
Unit division: 0
Unit division: 0

## 2014-06-13 LAB — GLUCOSE, CAPILLARY
GLUCOSE-CAPILLARY: 130 mg/dL — AB (ref 70–99)
Glucose-Capillary: 128 mg/dL — ABNORMAL HIGH (ref 70–99)
Glucose-Capillary: 202 mg/dL — ABNORMAL HIGH (ref 70–99)
Glucose-Capillary: 173 mg/dL — ABNORMAL HIGH (ref 70–99)

## 2014-06-13 LAB — PROTIME-INR
INR: 1.42 (ref 0.00–1.49)
PROTHROMBIN TIME: 17.4 s — AB (ref 11.6–15.2)

## 2014-06-13 LAB — CBC
HCT: 31.1 % — ABNORMAL LOW (ref 39.0–52.0)
Hemoglobin: 10.1 g/dL — ABNORMAL LOW (ref 13.0–17.0)
MCH: 27.5 pg (ref 26.0–34.0)
MCHC: 32.5 g/dL (ref 30.0–36.0)
MCV: 84.7 fL (ref 78.0–100.0)
PLATELETS: 88 10*3/uL — AB (ref 150–400)
RBC: 3.67 MIL/uL — AB (ref 4.22–5.81)
RDW: 17.3 % — ABNORMAL HIGH (ref 11.5–15.5)
WBC: 4.1 10*3/uL (ref 4.0–10.5)

## 2014-06-13 LAB — BASIC METABOLIC PANEL
ANION GAP: 16 — AB (ref 5–15)
BUN: 36 mg/dL — ABNORMAL HIGH (ref 6–23)
CO2: 21 meq/L (ref 19–32)
Calcium: 9.7 mg/dL (ref 8.4–10.5)
Chloride: 105 mEq/L (ref 96–112)
Creatinine, Ser: 2.63 mg/dL — ABNORMAL HIGH (ref 0.50–1.35)
GFR calc Af Amer: 25 mL/min — ABNORMAL LOW (ref >90)
GFR calc non Af Amer: 21 mL/min — ABNORMAL LOW (ref >90)
Glucose, Bld: 130 mg/dL — ABNORMAL HIGH (ref 70–99)
Potassium: 4.3 mEq/L (ref 3.7–5.3)
SODIUM: 142 meq/L (ref 137–147)

## 2014-06-13 MED ORDER — BISACODYL 10 MG RE SUPP: 10.0000 mg | Freq: Every day | RECTAL | Status: DC | PRN

## 2014-06-13 MED ORDER — POLYETHYLENE GLYCOL 3350 17 G PO PACK
17.0000 g | PACK | Freq: Every day | ORAL | Status: DC
  Administered 2014-06-13 – 2014-06-14 (×2): 17 g via ORAL
  Filled 2014-06-13 (×2): qty 1

## 2014-06-13 NOTE — Progress Notes (Signed)
Rehab admissions - I met with pt in follow up to rehab MD consult to explain the possibility of inpatient rehab. Questions were answered and informational brochures were given. Pt is interested in pursuing inpatient rehab. I explained that we would need to get insurance authorization from [facility].   I explained to him also that we could admit pending his medical clearance and our bed availability and that social worker will be following up with a back up plan for skilled nursing. Pt does not want to pursue SNF.  I then called and spoke with pt's son, Pilar Plate. Son is also interested in pursuing inpatient rehab and understood that SNF may need to be pursued if there are no available beds in inpatient rehab. I tried to call pt's dtr but there was no answer and voicemail was not set up on the phone.  I spoke with Dysheka, social worker about pt's case and Hassan Rowan, Tourist information centre manager.  I will now open the case with [employer] to seek insurance authorization. I will keep the pt/family and medical team aware of any updates. Thanks.  Nanetta Batty, PT Rehabilitation Admissions Coordinator [phone]

## 2014-06-13 NOTE — H&P (Signed)
Physical Medicine and Rehabilitation Admission H&P    Chief Complaint  Patient presents with  . stroke like symptoms   : Chief complaint: Weakness  HPI: [name] is a 80 y.o. right-handed male with history  diabetes mellitus with peripheral neuropathy,  CAD status post CABG, atrial flutter on chronic Coumadin, chronic renal insufficiency with baseline creatinine 6.96, chronic diastolic congestive heart failure. Independent with occasional cane and driving living with family. Admitted 06/11/2049 of left-sided weakness and slurred speech as well as recent fall but denies striking his head. Cranial CT scan shows a right pontomedullary 14 x 22 mm oval focus consistent with acute hemorrhage. INR on admission of 7.89 and systolic blood pressure 381. Placed on intravenous Lopressor and Coumadin was reversed. Followup cranial CT scan unchanged. 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 Tolerating a regular consistency diet. Coumadin remains on hold with cardiac rate control. Subcutaneous heparin was initiated for DVT prophylaxis 06/13/2014 Physical therapy evaluation completed 06/12/2014 with recommendations of physical medicine and rehabilitation consult. Patient was admitted for comprehensive rehabilitation program   ROS Review of Systems  Cardiovascular: Positive for palpitations.  Gastrointestinal:  GERD  Musculoskeletal: Positive for myalgias.  Neurological: Positive for speech change and weakness  Past Medical History  Diagnosis Date  . CAD (coronary artery disease)     s/p 3v CABG 2006, myoview 04/2010 EF 49%, prior inferior/apical infarct, no ischemia, LHC 11/2011 stable anatomy (occluded LAD filled from vein graft, distal LCx occluded prior to OM2, OM2 filled from vein graft, RCA prox 25%, mid 40%, distal 25% lesions, SVG to diagonal occluded) Med Rx  . Chronic diastolic heart failure     hx of cardiorenal syndrome;  Echo 06/2012 EF 01-75%, mod  diastolic dysfunction, mild MR, mod TR, mild R/LAE, mild RV dilatation, PASP 23mHg.  . Carotid stenosis     a. Carotid dopplers R 40-59%, Left 60-79%;   b. carotids 12/13:  01-02%RICA, 658-52%LICA (rpt in 6 mos);  c.  Carotid [place](11/14):  R 40-59%; L 60-79%, R vertebral occluded, L vertebral antegrade  . HLD (hyperlipidemia)   . Mediastinal adenopathy     per CT chest in 2006 with PET scan showing very limited metabolic activity; not felt to have a significant neoplastic potential  . Iron deficiency anemia   . Hypertension   . Diabetes mellitus     insulin dependent   . GERD (gastroesophageal reflux disease)   . AV block, Mobitz 1     Noted 11/2011 in hospital, BB stopped  . Colon polyps   . Diverticulosis   . Pulmonary arterial hypertension     a.  RHC 11/1 PASP 60 mmHg (mean 38); b. RHC 11/15 PA pressure 66/24, PCWP 26 and CO 6.2 => Sildenafil started;   c. Echo bubble study 11/13:  no obvious shunt;   d. VQ scan neg for pulmonary embolism  . CKD (chronic kidney disease) stage 5, GFR less than 15 ml/min     hx of cardiorenal syndrome;  Dr PPosey Pronto  AVF recommended and scheduled (patient hesitant to proceed);   Renal [place]11/13: diff echogenic kidneys c/w medical renal disease; no hydronephrosis or renal mass  . Hyperparathyroidism, secondary renal   . MGUS (monoclonal gammopathy of unknown significance)     a. Kappa/Lambda free light chain ratio 11/13:  23.99;  b. Met. Bone survey 11/13:  no osteolytic lesions to suggest MMyeloma  . Atrial flutter     diagnosed 05/2013; Coumadin initiated  Past Surgical History  Procedure Laterality Date  . Appendectomy    . Coronary artery bypass graft  2006    SVG to OM2, SVG to LAD, SVG to DX; (the LIMA did not have good flow and therefore was not used)  . Cardiac catheterization  12/21/2011   Family History  Problem Relation Age of Onset  . Stomach cancer Sister   . Pancreatic cancer Brother   . Lung cancer Brother   . Colon cancer Neg Hx     Social History:  reports that he quit smoking about 60 years ago. He has never used smokeless tobacco. He reports that he does not drink alcohol or use illicit drugs. Allergies: No Known Allergies Medications Prior to Admission  Medication Sig Dispense Refill  . ADVOCATE LANCETS MISC       . Alcohol Swabs (ALCOHOL PREP) 70 % PADS       . amLODipine (NORVASC) 10 MG tablet Take 10 mg by mouth daily.      . atorvastatin (LIPITOR) 20 MG tablet take 1 tablet by mouth at bedtime  30 tablet  3  . Blood Glucose Calibration (OT ULTRA/FASTTK CNTRL SOLN) SOLN       . carvedilol (COREG) 6.25 MG tablet Take 1 tablet (6.25 mg total) by mouth 2 (two) times daily.  180 tablet  3  . furosemide (LASIX) 80 MG tablet Take 80 mg by mouth daily.      . insulin detemir (LEVEMIR) 100 UNIT/ML injection Inject 0.15 mLs (15 Units total) into the skin at bedtime.  10 mL  2  . Insulin Pen Needle (NOVOFINE) 30G X 8 MM MISC Use as directed once daily with insulin DUE FOR LABS  30 each  0  . Lancet Devices (ADVOCATE RAPID-SAFE LANCING) MISC       . ONE TOUCH ULTRA TEST test strip       . potassium chloride SA (K-DUR,KLOR-CON) 20 MEQ tablet Take 20 mEq by mouth 2 (two) times daily.      . saxagliptin HCl (ONGLYZA) 2.5 MG TABS tablet Take 1 tablet (2.5 mg total) by mouth daily.  30 tablet  11  . Vitamin D, Ergocalciferol, (DRISDOL) 50000 UNITS CAPS capsule Take 50,000 Units by mouth every 7 (seven) days.      . warfarin (COUMADIN) 6 MG tablet Take 1 tablet (6 mg total) by mouth as directed.  40 tablet  3    Home: Home Living Family/patient expects to be discharged to:: Private residence Living Arrangements: Children Available Help at Discharge: Family Type of Home: House Home Access: Level entry Home Layout: Two level;Able to live on main level with bedroom/bathroom Home Equipment: None   Functional History: Prior Function Level of Independence: Independent  Functional Status:  Mobility: Bed Mobility Overal  bed mobility: Needs Assistance Bed Mobility: Supine to Sit;Sit to Supine Supine to sit: Mod assist (with verbal cues) Sit to supine: Mod assist (verbal cueing) General bed mobility comments: Pt demonstrates diffifulty with sequencing of task and requires mulitmodal cueing and physical assistance for supine to sit. Pt requires less cueing for return to supine.         ADL:    Cognition: Cognition Overall Cognitive Status: Impaired/Different from baseline Arousal/Alertness: Awake/alert Orientation Level: Oriented to person;Oriented to place;Oriented to situation Attention: Sustained Sustained Attention: Appears intact Memory: Appears intact Awareness: Impaired Awareness Impairment: Anticipatory impairment Problem Solving: Impaired Problem Solving Impairment: Functional basic Cognition Arousal/Alertness: Awake/alert Behavior During Therapy: WFL for tasks assessed/performed Overall Cognitive Status: Impaired/Different   from baseline Area of Impairment: Attention;Following commands;Safety/judgement;Awareness;Problem solving Current Attention Level: Selective Memory: Decreased short-term memory Following Commands: Follows one step commands inconsistently;Follows one step commands with increased time Safety/Judgement: Decreased awareness of safety Awareness:  (Reduced awareness of deficits and problems ) Problem Solving: Slow processing;Decreased initiation;Difficulty sequencing;Requires verbal cues;Requires tactile cues  Physical Exam: Blood pressure 151/62, pulse 60, temperature 98.6 F (37 C), temperature source Oral, resp. rate 18, height 5' 11" (1.803 m), weight 81.149 kg (178 lb 14.4 oz), SpO2 92.00%. Physical Exam HENT: oral mucosa pink/ dentition fair to poor Head: Normocephalic.  Eyes: EOM are normal.  Neck: Normal range of motion. Neck supple. No thyromegaly present.  Cardiovascular:  Cardiac rate control. Irregular, SEM  Respiratory: Effort normal and breath sounds  normal. No respiratory distress.  GI: Soft. Bowel sounds are normal. He exhibits no distension.  Neurological: He is alert.  Left upper: delt, bicep, pec tr to 1. Wrist and hand 0, left lower extremity: 1/5 HF, KE, 0/5 ankle. Right upper and lower extremity 4+ to 5/5 proximal to distal. No gross sensory deficits.   Speech is dysarthric but intelligible. He makes good eye contact with examiner. He was able to provide his age and date of birth. Follows simple commands. Some delays in processing  Skin: Skin is warm and dry.  Psychiatric: He has a flat affect.  Results for orders placed during the hospital encounter of 06/11/14 (from the past 48 hour(s))  PROTIME-INR     Status: Abnormal   Collection Time    06/11/14  2:07 PM      Result Value Ref Range   Prothrombin Time 23.6 (*) 11.6 - 15.2 seconds   INR 2.10 (*) 0.00 - 1.49  APTT     Status: None   Collection Time    06/11/14  2:07 PM      Result Value Ref Range   aPTT 36  24 - 37 seconds  CBC     Status: Abnormal   Collection Time    06/11/14  2:07 PM      Result Value Ref Range   WBC 4.2  4.0 - 10.5 K/uL   RBC 4.16 (*) 4.22 - 5.81 MIL/uL   Hemoglobin 11.8 (*) 13.0 - 17.0 g/dL   HCT 35.9 (*) 39.0 - 52.0 %   MCV 86.3  78.0 - 100.0 fL   MCH 28.4  26.0 - 34.0 pg   MCHC 32.9  30.0 - 36.0 g/dL   RDW 17.3 (*) 11.5 - 15.5 %   Platelets 112 (*) 150 - 400 K/uL   Comment: PLATELET COUNT CONFIRMED BY SMEAR  DIFFERENTIAL     Status: None   Collection Time    06/11/14  2:07 PM      Result Value Ref Range   Neutrophils Relative % 68  43 - 77 %   Neutro Abs 2.9  1.7 - 7.7 K/uL   Lymphocytes Relative 26  12 - 46 %   Lymphs Abs 1.1  0.7 - 4.0 K/uL   Monocytes Relative 6  3 - 12 %   Monocytes Absolute 0.2  0.1 - 1.0 K/uL   Eosinophils Relative 1  0 - 5 %   Eosinophils Absolute 0.0  0.0 - 0.7 K/uL   Basophils Relative 0  0 - 1 %   Basophils Absolute 0.0  0.0 - 0.1 K/uL  COMPREHENSIVE METABOLIC PANEL     Status: Abnormal   Collection  Time    06/11/14  2:07 PM        Result Value Ref Range   Sodium 142  137 - 147 mEq/L   Potassium 4.0  3.7 - 5.3 mEq/L   Chloride 106  96 - 112 mEq/L   CO2 21  19 - 32 mEq/L   Glucose, Bld 147 (*) 70 - 99 mg/dL   BUN 35 (*) 6 - 23 mg/dL   Creatinine, Ser 2.47 (*) 0.50 - 1.35 mg/dL   Calcium 9.9  8.4 - 10.5 mg/dL   Total Protein 8.2  6.0 - 8.3 g/dL   Albumin 3.9  3.5 - 5.2 g/dL   AST 20  0 - 37 U/L   ALT 11  0 - 53 U/L   Alkaline Phosphatase 103  39 - 117 U/L   Total Bilirubin 0.7  0.3 - 1.2 mg/dL   GFR calc non Af Amer 23 (*) >90 mL/min   GFR calc Af Amer 27 (*) >90 mL/min   Comment: (NOTE)     The eGFR has been calculated using the CKD EPI equation.     This calculation has not been validated in all clinical situations.     eGFR's persistently <90 mL/min signify possible Chronic Kidney     Disease.   Anion gap 15  5 - 15  HEMOGLOBIN A1C     Status: Abnormal   Collection Time    06/11/14  2:07 PM      Result Value Ref Range   Hemoglobin A1C 6.7 (*) <5.7 %   Comment: (NOTE)                                                                               According to the ADA Clinical Practice Recommendations for 2011, when     HbA1c is used as a screening test:      >=6.5%   Diagnostic of Diabetes Mellitus               (if abnormal result is confirmed)     5.7-6.4%   Increased risk of developing Diabetes Mellitus     References:Diagnosis and Classification of Diabetes Mellitus,Diabetes     KTGY,5638,93(TDSKA 1):S62-S69 and Standards of Medical Care in             Diabetes - 2011,Diabetes Care,2011,34 (Suppl 1):S11-S61.   Mean Plasma Glucose 146 (*) <117 mg/dL   Comment: Performed at [facility], ED     Status: None   Collection Time    06/11/14  3:05 PM      Result Value Ref Range   Troponin i, poc 0.02  0.00 - 0.08 ng/mL   Comment 3            Comment: Due to the release kinetics of cTnI,     a negative result within the first hours     of the  onset of symptoms does not rule out     myocardial infarction with certainty.     If myocardial infarction is still suspected,     repeat the test at appropriate intervals.  CBG MONITORING, ED     Status: Abnormal   Collection Time    06/11/14  3:41 PM      Result Value  Ref Range   Glucose-Capillary 168 (*) 70 - 99 mg/dL   Comment 1 Documented in Chart     Comment 2 Notify RN    PREPARE FRESH FROZEN PLASMA     Status: None   Collection Time    06/11/14  7:00 PM      Result Value Ref Range   Unit Number [id number]     Blood Component Type THAWED PLASMA     Unit division 00     Status of Unit ISSUED,FINAL     Transfusion Status OK TO TRANSFUSE     Unit Number [id number]     Blood Component Type THAWED PLASMA     Unit division 00     Status of Unit ISSUED,FINAL     Transfusion Status OK TO TRANSFUSE     Unit Number [id number]     Blood Component Type THAWED PLASMA     Unit division 00     Status of Unit ISSUED,FINAL     Transfusion Status OK TO TRANSFUSE     Unit Number [id number]     Blood Component Type THAWED PLASMA     Unit division 00     Status of Unit ISSUED     Transfusion Status OK TO TRANSFUSE    MRSA PCR SCREENING     Status: None   Collection Time    06/11/14  9:31 PM      Result Value Ref Range   MRSA by PCR NEGATIVE  NEGATIVE   Comment:            The GeneXpert MRSA Assay (FDA     approved for NASAL specimens     only), is one component of a     comprehensive MRSA colonization     surveillance program. It is not     intended to diagnose MRSA     infection nor to guide or     monitor treatment for     MRSA infections.  GLUCOSE, CAPILLARY     Status: Abnormal   Collection Time    06/12/14  7:47 AM      Result Value Ref Range   Glucose-Capillary 153 (*) 70 - 99 mg/dL   Comment 1 Notify RN     Comment 2 Documented in Chart    CBC     Status: Abnormal   Collection Time    06/12/14  9:10 AM      Result Value Ref Range   WBC 3.8 (*) 4.0 -  10.5 K/uL   RBC 3.86 (*) 4.22 - 5.81 MIL/uL   Hemoglobin 10.7 (*) 13.0 - 17.0 g/dL   HCT 32.4 (*) 39.0 - 52.0 %   MCV 83.9  78.0 - 100.0 fL   MCH 27.7  26.0 - 34.0 pg   MCHC 33.0  30.0 - 36.0 g/dL   RDW 17.3 (*) 11.5 - 15.5 %   Platelets 80 (*) 150 - 400 K/uL   Comment: PLATELET COUNT CONFIRMED BY SMEAR     REPEATED TO VERIFY  BASIC METABOLIC PANEL     Status: Abnormal   Collection Time    06/12/14  9:10 AM      Result Value Ref Range   Sodium 143  137 - 147 mEq/L   Potassium 3.8  3.7 - 5.3 mEq/L   Chloride 107  96 - 112 mEq/L   CO2 23  19 - 32 mEq/L   Glucose, Bld 137 (*) 70 - 99 mg/dL   BUN 34 (*)  6 - 23 mg/dL   Creatinine, Ser 2.52 (*) 0.50 - 1.35 mg/dL   Calcium 10.1  8.4 - 10.5 mg/dL   GFR calc non Af Amer 22 (*) >90 mL/min   GFR calc Af Amer 26 (*) >90 mL/min   Comment: (NOTE)     The eGFR has been calculated using the CKD EPI equation.     This calculation has not been validated in all clinical situations.     eGFR's persistently <90 mL/min signify possible Chronic Kidney     Disease.   Anion gap 13  5 - 15  PROTIME-INR     Status: Abnormal   Collection Time    06/12/14  9:10 AM      Result Value Ref Range   Prothrombin Time 20.0 (*) 11.6 - 15.2 seconds   INR 1.70 (*) 0.00 - 1.49  GLUCOSE, CAPILLARY     Status: Abnormal   Collection Time    06/12/14 11:28 AM      Result Value Ref Range   Glucose-Capillary 154 (*) 70 - 99 mg/dL   Comment 1 Notify RN     Comment 2 Documented in Chart    GLUCOSE, CAPILLARY     Status: None   Collection Time    06/12/14  4:54 PM      Result Value Ref Range   Glucose-Capillary 96  70 - 99 mg/dL   Comment 1 Documented in Chart     Comment 2 Notify RN    GLUCOSE, CAPILLARY     Status: Abnormal   Collection Time    06/12/14  9:20 PM      Result Value Ref Range   Glucose-Capillary 142 (*) 70 - 99 mg/dL   Comment 1 Documented in Chart     Comment 2 Notify RN    CBC     Status: Abnormal   Collection Time    06/13/14  4:25 AM       Result Value Ref Range   WBC 4.1  4.0 - 10.5 K/uL   RBC 3.67 (*) 4.22 - 5.81 MIL/uL   Hemoglobin 10.1 (*) 13.0 - 17.0 g/dL   HCT 31.1 (*) 39.0 - 52.0 %   MCV 84.7  78.0 - 100.0 fL   MCH 27.5  26.0 - 34.0 pg   MCHC 32.5  30.0 - 36.0 g/dL   RDW 17.3 (*) 11.5 - 15.5 %   Platelets 88 (*) 150 - 400 K/uL   Comment: CONSISTENT WITH PREVIOUS RESULT  BASIC METABOLIC PANEL     Status: Abnormal   Collection Time    06/13/14  4:25 AM      Result Value Ref Range   Sodium 142  137 - 147 mEq/L   Potassium 4.3  3.7 - 5.3 mEq/L   Chloride 105  96 - 112 mEq/L   CO2 21  19 - 32 mEq/L   Glucose, Bld 130 (*) 70 - 99 mg/dL   BUN 36 (*) 6 - 23 mg/dL   Creatinine, Ser 2.63 (*) 0.50 - 1.35 mg/dL   Calcium 9.7  8.4 - 10.5 mg/dL   GFR calc non Af Amer 21 (*) >90 mL/min   GFR calc Af Amer 25 (*) >90 mL/min   Comment: (NOTE)     The eGFR has been calculated using the CKD EPI equation.     This calculation has not been validated in all clinical situations.     eGFR's persistently <90 mL/min signify possible Chronic Kidney  Disease.   Anion gap 16 (*) 5 - 15  PROTIME-INR     Status: Abnormal   Collection Time    06/13/14  4:25 AM      Result Value Ref Range   Prothrombin Time 17.4 (*) 11.6 - 15.2 seconds   INR 1.42  0.00 - 1.49  LIPID PANEL     Status: Abnormal   Collection Time    06/13/14  4:25 AM      Result Value Ref Range   Cholesterol 109  0 - 200 mg/dL   Triglycerides 61  <150 mg/dL   HDL 38 (*) >39 mg/dL   Total CHOL/HDL Ratio 2.9     VLDL 12  0 - 40 mg/dL   LDL Cholesterol 59  0 - 99 mg/dL   Comment:            Total Cholesterol/HDL:CHD Risk     Coronary Heart Disease Risk Table                         Men   Women      1/2 Average Risk   3.4   3.3      Average Risk       5.0   4.4      2 X Average Risk   9.6   7.1      3 X Average Risk  23.4   11.0                Use the calculated Patient Ratio     above and the CHD Risk Table     to determine the patient's CHD Risk.                 ATP III CLASSIFICATION (LDL):      <100     mg/dL   Optimal      100-129  mg/dL   Near or Above                        Optimal      130-159  mg/dL   Borderline      160-189  mg/dL   High      >190     mg/dL   Very High  GLUCOSE, CAPILLARY     Status: Abnormal   Collection Time    06/13/14  6:36 AM      Result Value Ref Range   Glucose-Capillary 128 (*) 70 - 99 mg/dL   Comment 1 Documented in Chart     Comment 2 Notify RN     Ct Head Wo Contrast  06/12/2014   CLINICAL DATA:  Reassess known intracranial hemorrhage  EXAM: CT HEAD WITHOUT CONTRAST  TECHNIQUE: Contiguous axial images were obtained from the base of the skull through the vertex without intravenous contrast.  COMPARISON:  Noncontrast CT scan of the brain dated 11 June 2014  FINDINGS: Again demonstrated is a hyperdense focus in the right anterior and mid pons. It measures 1.7 cm transversely x 0.9 cm AP and is slightly smaller overall in appearance since yesterday's study. There is no subarachnoid blood. There is no hemorrhagic focus elsewhere within the brain.  There is mild stable diffuse cerebral and cerebellar atrophy with compensatory ventriculomegaly. Stable decreased density in the deep white matter of both cerebral hemispheres is consistent with chronic small vessel ischemic change. Mild encephalomalacia in the posterior parietal deep white matter and adjacent cortex  is stable. The cerebellum exhibits no acute abnormality.  The calvarium is intact. The observed paranasal sinuses and mastoid air cells are clear.  IMPRESSION: 1. The known hemorrhagic focus within the anterior and right aspect of the pons has not increased in size and is in fact slightly less conspicuous today. No intracranial hemorrhage otherwise is demonstrated. 2. There are stable changes of chronic small vessel ischemia and diffuse atrophy.   Electronically Signed   By: David  Jordan   On: 06/12/2014 08:39   Ct Head (brain) Wo Contrast  06/11/2014    CLINICAL DATA:  Stroke like symptoms trauma slurred speech and leg weakness  EXAM: CT HEAD WITHOUT CONTRAST  TECHNIQUE: Contiguous axial images were obtained from the base of the skull through the vertex without intravenous contrast.  COMPARISON:  06/28/2013, 06/30/2013  FINDINGS: There is severe diffuse atrophy and low attenuation in the deep white matter diffusely. This is unchanged from the prior study. There is proportional dilatation of the ventricles. There is no evidence of abnormality involving the calvarium and there is no significant inflammatory change in the sinuses.  In the right pontomedullary junction there is a 14 x 22 mm oval focus of hyperattenuation that was not present previously. There is no extra-axial hemorrhage. There is no evidence of vascular territory ischemic infarct or mass. Atherosclerotic calcification of the intracranial vessels is again noted.  IMPRESSION: Superimposed on chronic hypertensive involutional change, there is a 22 x 14 mm hyper attenuating focus in the right pontomedullary junction most consistent with acute hemorrhage. Critical Value/emergent results were called by telephone at the time of interpretation on 06/11/2014 at 5:22 pm to Dr. STEPHEN RANCOUR , who verbally acknowledged these results.   Electronically Signed   By: Raymond  Rubner M.D.   On: 06/11/2014 17:22       Medical Problem List and Plan: 1. 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 Latest cranial CT scan stable 2.  DVT Prophylaxis/Anticoagulation: Subcutaneous heparin initiated for DVT prophylaxis 06/13/2014 3. Pain Management: Tylenol as needed. Monitor the increased mobility 4. Diabetes mellitus with peripheral neuropathy. Latest hemoglobin A1c 6.7. Check blood sugars a.c. and at bedtime.Tradjenta 5 mg daily. Patient on Levemir 15 units each bedtime and Onglyza 2.5 mg daily prior to admission. Plan to resume as needed 5. Neuropsych: This  patient is not capable of making decisions on his own behalf. 6. Skin/Wound Care: Routine skin checks 7. Chronic renal insufficiency. Baseline creatinine 2.84. Followup chemistries 8. Chronic diastolic congestive heart failure. Monitor for any signs of fluid overload. Patient on Lasix 80 mg  Daily. 9. Hypertension. Norvasc 10 mg daily, Coreg 6.25 mg twice a day. Monitor with increased mobility 10. CAD with history of CABG. No chest pain or shortness of breath 11. Hyperlipidemia. Lipitor 12. GERD. Protonix  Post Admission Physician Evaluation: 1. Functional deficits secondary  to right ponto-medullary ICH. 2. Patient is admitted to receive collaborative, interdisciplinary care between the physiatrist, rehab nursing staff, and therapy team. 3. Patient's level of medical complexity and substantial therapy needs in context of that medical necessity cannot be provided at a lesser intensity of care such as a SNF. 4. Patient has experienced substantial functional loss from his/her baseline which was documented above under the "Functional History" and "Functional Status" headings.  Judging by the patient's diagnosis, physical exam, and functional history, the patient has potential for functional progress which will result in measurable gains while on inpatient rehab.  These gains will be of substantial and   practical use upon discharge  in facilitating mobility and self-care at the household level. 5. Physiatrist will provide 24 hour management of medical needs as well as oversight of the therapy plan/treatment and provide guidance as appropriate regarding the interaction of the two. 6. 24 hour rehab nursing will assist with bladder management, bowel management, safety, skin/wound care, disease management, medication administration, pain management and patient education  and help integrate therapy concepts, techniques,education, etc. 7. PT will assess and treat for/with: Lower extremity strength, range of  motion, stamina, balance, functional mobility, safety, adaptive techniques and equipment, NMR, cognitive perceptual awareness, stroke education, family ed, egosupport.   Goals are: supervision to min assist. 8. OT will assess and treat for/with: ADL's, functional mobility, safety, upper extremity strength, adaptive techniques and equipment, NMR, cognitive perceptual rx, leisure awareness, family ed.   Goals are: supervision to min assist. Therapy may proceed with showering this patient. 9. SLP will assess and treat for/with: communication, cognition, speech.  Goals are: supervision to mod I. 10. Case Management and Social Worker will assess and treat for psychological issues and discharge planning. 11. Team conference will be held weekly to assess progress toward goals and to determine barriers to discharge. 12. Patient will receive at least 3 hours of therapy per day at least 5 days per week. 13. ELOS: 20-25 days       14. Prognosis:  excellent     Nastasia Kage T. Veora Fonte, MD, FAAPMR  Physical Medicine & Rehabilitation 06/14/2014   06/13/2014 

## 2014-06-13 NOTE — Progress Notes (Signed)
Occupational Therapy Evaluation Patient Details Name: [name] MRN: [id number] DOB: 05-Apr-1931 Today's Date: 06/13/2014    History of Present Illness Pt presents with L sided weakness and dysarthric speech s/p R pontomedullary hemorrhage starting 4 days prior to admission. Pt with hx of CAD, CHF, a-fib, HTN, and DM.    Clinical Impression   PTA pt lived at home with his son and reports that he was independent with use of [facility] for ADLs and functional mobility. Pt presents with L-sided weakness and impaired cognition which are impacting his ability to be independent with ADLs. Pt is highly motivated and would be an excellent candidate for CIR. Pt will continue to benefit from acute OT to address LUE weakness to facilitate independence with ADLs, as well as functional transfers.     Follow Up Recommendations  CIR;Supervision/Assistance - 24 hour    Equipment Recommendations  3 in 1 bedside comode    Recommendations for Other Services       Precautions / Restrictions Precautions Precautions: Fall Restrictions Weight Bearing Restrictions: No      Mobility Bed Mobility Overal bed mobility: Needs Assistance Bed Mobility: Supine to Sit;Sit to Supine     Supine to sit: Min assist;HOB elevated Sit to supine: Mod assist;HOB elevated   General bed mobility comments: Pt able to use bed rail with RUE to advance Bil LEs off bed and scoot hips around. Pt was able to push up to elevate trunk off bed and required min (A) to maintain trunk control while repositioning hips and flattening bed. Pt required increased assist to return to supine, as he required assistance to return Bil LEs to bed.   Transfers  Not addressed at this time.                     Balance Overall balance assessment: Needs assistance Sitting-balance support: No upper extremity supported;Feet supported Sitting balance-Leahy Scale: Fair Sitting balance - Comments: pt balance improved by the end of session  with increased trunk control and both hands on his lap.                                     ADL Overall ADL's : Needs assistance/impaired Eating/Feeding: Set up;Sitting Eating/Feeding Details (indicate cue type and reason): pt requires assist to cut food and open containers due to decreased functional use of LUE. Pt able to utilize R, dominant hand to feed self with some difficulty due to positioning challenges.  Grooming: Set up;Sitting Grooming Details (indicate cue type and reason): sitting EOB with support, able to wipe face with Rt hand. Upper Body Bathing: Sitting;Moderate assistance                             General ADL Comments: Pt sat EOB x 10 minutes for exercises and grooming activities to facilitate trunk control. Pt also performed therapeutic exercises sitting EOB and demonstrated improved static sitting by the end of the session.      Vision                 Additional Comments: Vision to be further assessed. Pt reports no change from baseline, however cognition is impaired and would benefit from further assessment in functional context.    Perception Perception Perception Tested?: No   Praxis Praxis Praxis tested?: Within functional limits    Pertinent Vitals/Pain Pain  Assessment: No/denies pain     Hand Dominance Right   Extremity/Trunk Assessment Upper Extremity Assessment Upper Extremity Assessment: LUE deficits/detail LUE Deficits / Details: pt with 3/5 grasp, however unable to extend composite digits fully. Pt has 3/5 wrist flexion/extension and trace biceps/triceps and shoulder flexion.  LUE Coordination: decreased fine motor;decreased gross motor   Lower Extremity Assessment Lower Extremity Assessment: Defer to PT evaluation   Cervical / Trunk Assessment Cervical / Trunk Assessment: Other exceptions (right lateral lean  in sitting)   Communication Communication Communication: Expressive difficulties   Cognition  Arousal/Alertness: Awake/alert Behavior During Therapy: WFL for tasks assessed/performed Overall Cognitive Status: Impaired/Different from baseline Area of Impairment: Following commands;Safety/judgement;Attention;Memory;Problem solving;Awareness   Current Attention Level: Selective Memory: Decreased short-term memory Following Commands: Follows one step commands inconsistently;Follows one step commands with increased time Safety/Judgement: Decreased awareness of safety;Decreased awareness of deficits Awareness: Emergent Problem Solving: Slow processing;Decreased initiation;Difficulty sequencing;Requires verbal cues;Requires tactile cues        Exercises Exercises: Other exercises Other Exercises Other Exercises: Pt performed lap slides x 10 with LUE sitting EOB with therapist support. Educated pt on scapular exercises and practiced in sitting. Pt also practiced Bil LE exercises of knee raises and knee extension.  Other Exercises: Pt performed composite digit flexion and extension (as much as possible) x5. Demonstrated "prayer" stretch and flattening palm on a flat surface using RUE to maintain and increase digit extension.         Home Living Family/patient expects to be discharged to:: Private residence Living Arrangements: Children Available Help at Discharge: Family Type of Home: House Home Access: Level entry     [place]: Two level;Able to live on main level with bedroom/bathroom     Bathroom Shower/Tub: Teacher, early years/pre: Standard     Home Equipment: None          Prior Functioning/Environment Level of Independence: Independent with assistive device(s)        Comments: pt reports he used cane for long distance ambulation as needed    OT Diagnosis: Generalized weakness;Cognitive deficits;Hemiplegia non-dominant side   OT Problem List: Decreased strength;Decreased range of motion;Decreased activity tolerance;Impaired balance (sitting and/or  standing);Decreased coordination;Decreased cognition;Decreased safety awareness;Decreased knowledge of use of DME or AE;Decreased knowledge of precautions;Impaired UE functional use   OT Treatment/Interventions: Self-care/ADL training;Therapeutic exercise;Neuromuscular education;Energy conservation;DME and/or AE instruction;Therapeutic activities;Cognitive remediation/compensation;Patient/family education;Balance training    OT Goals(Current goals can be found in the care plan section) Acute Rehab OT Goals Patient Stated Goal: to get stronger and go to rehab (CIR) OT Goal Formulation: With patient Time For Goal Achievement: 06/27/14 Potential to Achieve Goals: Good ADL Goals Pt Will Perform Eating: with modified independence;sitting Pt Will Perform Grooming: with modified independence (sitting EOB) Pt Will Perform Upper Body Bathing: with set-up;with supervision (sitting EOB) Pt Will Perform Upper Body Dressing: with set-up;with supervision;sitting Pt Will Transfer to Toilet: with min assist;stand pivot transfer;bedside commode Pt/caregiver will Perform Home Exercise Program: Increased strength;Left upper extremity;With Supervision  OT Frequency: Min 2X/week              End of Session Nurse Communication: Other (comment) (pt condom cath coming off)  Activity Tolerance: Patient tolerated treatment well Patient left: in bed;with call bell/phone within reach;with bed alarm set   Time: 5597-4163 OT Time Calculation (min): 61 min Charges:  OT General Charges $OT Visit: 1 Procedure OT Evaluation $Initial OT Evaluation Tier I: 1 Procedure OT Treatments $Self Care/Home Management : 8-22 mins $Therapeutic Activity: 8-22  mins $Therapeutic Exercise: 8-22 mins  Juluis Rainier [phone] 06/13/2014, 4:24 PM

## 2014-06-13 NOTE — Progress Notes (Signed)
[place] NOTE   HISTORY [name] is an 80 y.o. male with PMH significant for HTN, hyperlipidemia, DM, CAD s/p CABG, atrial flutter on coumadin, CKD, MGUS, brought in by family for further evaluation of the above stated symptoms.  Patient stated that he started having " weakness in my legs, mainly the left leg and also in my left arm" several days ago which impeded normal walking to the point that he had a fall in which did not injured his head. [name] addition, his son said that he has been noticing slurred speech and droopiness of his left face since this past Friday.  Denies HA, vertigo, double vision, difficulty swallowing, confusion, or visual disturbances.  CT brain today showed a right pontomedullary hematoma measuring 14 x 22. Neurosurgery was consulted by the ED and not aggressive neurosurgical intervention was recommended.  INR 2.10  SBP 160 in the ED and received 10 mg IV labetalol.  Date last known well: unclear  Time last known well: unclear  tPA Given: no, ICH  SUBJECTIVE (INTERVAL HISTORY) No family is at the bedside.  Overall he feels his condition is stable. He was eating breakfast during rounds. INR this morning is 1.7 after reversal yesterday. Repeat CT head showed no expansion of hematoma.  OBJECTIVE Temp:  [97.5 F (36.4 C)-99.7 F (37.6 C)] 98.7 F (37.1 C) (09/17 2114) Pulse Rate:  [60-67] 63 (09/17 2114) Cardiac Rhythm:  [-] Atrial fibrillation (09/17 0800) Resp:  [18-20] 20 (09/17 2114) BP: (151-180)/(62-79) 180/70 mmHg (09/17 2114) SpO2:  [92 %-95 %] 93 % (09/17 2114)   Recent Labs Lab 06/12/14 2120 06/13/14 0636 06/13/14 1134 06/13/14 1638 06/13/14 2145  GLUCAP 142* 128* 202* 173* 130*    Recent Labs Lab 06/11/14 1407 06/12/14 0910 06/13/14 0425  NA 142 143 142  K 4.0 3.8 4.3  CL 106 107 105  CO2 21 23 21   GLUCOSE 147* 137* 130*  BUN 35* 34* 36*  CREATININE 2.47* 2.52* 2.63*  CALCIUM 9.9 10.1 9.7    Recent Labs Lab  06/11/14 1407  AST 20  ALT 11  ALKPHOS 103  BILITOT 0.7  PROT 8.2  ALBUMIN 3.9    Recent Labs Lab 06/11/14 1407 06/12/14 0910 06/13/14 0425  WBC 4.2 3.8* 4.1  NEUTROABS 2.9  --   --   HGB 11.8* 10.7* 10.1*  HCT 35.9* 32.4* 31.1*  MCV 86.3 83.9 84.7  PLT 112* 80* 88*   No results found for this basename: CKTOTAL, CKMB, CKMBINDEX, TROPONINI,  in the last 168 hours  Recent Labs  06/11/14 1407 06/12/14 0910 06/13/14 0425  LABPROT 23.6* 20.0* 17.4*  INR 2.10* 1.70* 1.42   No results found for this basename: COLORURINE, APPERANCEUR, LABSPEC, PHURINE, GLUCOSEU, HGBUR, BILIRUBINUR, KETONESUR, PROTEINUR, UROBILINOGEN, NITRITE, LEUKOCYTESUR,  in the last 72 hours     Component Value Date/Time   CHOL 109 06/13/2014 0425   TRIG 61 06/13/2014 0425   HDL 38* 06/13/2014 0425   CHOLHDL 2.9 06/13/2014 0425   VLDL 12 06/13/2014 0425   LDLCALC 59 06/13/2014 0425   Lab Results  Component Value Date   HGBA1C 6.7* 06/11/2014   No results found for this basename: labopia,  cocainscrnur,  labbenz,  amphetmu,  thcu,  labbarb    No results found for this basename: ETH,  in the last 168 hours  Ct Head Wo Contrast  06/12/2014   IMPRESSION: 1. The known hemorrhagic focus within the anterior and right aspect of the pons has not  increased in size and is in fact slightly less conspicuous today. No intracranial hemorrhage otherwise is demonstrated. 2. There are stable changes of chronic small vessel ischemia and diffuse atrophy.      Ct Head (brain) Wo Contrast  06/11/2014   IMPRESSION: Superimposed on chronic hypertensive involutional change, there is a 22 x 14 mm hyper attenuating focus in the right pontomedullary junction most consistent with acute hemorrhage.    2D ehco - - Normal LV size with mild LV hypertrophy. EF 50-55%. Inferior hypokinesis. D-shaped interventricular septum suggesting RV pressure/volume overload. Mildly dilated RV with normal systolic function. Severe pulmonary  hypertension.  CUS - 02/2014 -  Heterogeneous plaque, bilaterally. Stable, 40-59% RICA stenosis. Stable, 40-98% LICA stenosis. Occluded right vertebral artery. Patent left vertebral artery with antegrade flow. Normal subclavian arteries, bilaterally.   PHYSICAL EXAM  Temp:  [97.5 F (36.4 C)-99.7 F (37.6 C)] 98.7 F (37.1 C) (09/17 2114) Pulse Rate:  [60-67] 63 (09/17 2114) Resp:  [18-20] 20 (09/17 2114) BP: (151-180)/(62-79) 180/70 mmHg (09/17 2114) SpO2:  [92 %-95 %] 93 % (09/17 2114)  General - Well nourished, well developed, in no apparent distress.  Ophthalmologic - not able to see through.  Cardiovascular - Regular rate and rhythm with no murmur.  Mental Status -  Level of arousal and orientation to time, place, and person were intact. Language including expression, naming, repetition, comprehension was assessed and found intact, but mildly dysarthric.  Cranial Nerves II - XII - II - Visual field intact OU. III, IV, VI - Extraocular movements intact. V - Facial sensation intact bilaterally. VII - left facial droop. VIII - Hearing & vestibular intact bilaterally. X - Palate elevates symmetrically, but mildly dysarthric. XI - Chin turning & shoulder shrug intact bilaterally. XII - Tongue protrusion intact.  Motor Strength - The patient's strength was LUE 3/5 proximal and 4/5 distal, LLE 3/5, RUE and RLE 5/5 and pronator drift was present.  Bulk was normal and fasciculations were absent.   Motor Tone - Muscle tone was assessed at the neck and appendages and was normal.  Reflexes - The patient's reflexes were 1+ in all extremities and he had left babinski positive.  Sensory - Light touch, temperature/pinprick were assessed and were normal.    Coordination - The patient had normal movements in the hands with no ataxia or dysmetria.  Tremor was absent.  Gait and Station - not tested.   ASSESSMENT/PLAN  [name] is a 80 y.o. male with history of afib  on coumadin, HTN, HLD, DM, CAD s/p CABG, CKD presenting with left sided weakness for a week. He did not receive IV t-PA due to [place]. Imaging confirms a right pontomedullary ICH. INR 2.1 and he received kcentra reversal. This am INR 1.7. Repeat CT stable hematoma.   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 secondary to anticoagulation use     warfarin prior to admission, now on no antithrombotics  2D Echo  pending   LDL pending  HgbA1c 6.7 borderline  SCDs for VTE prophylaxis  Carb Control    Activity as tolerated  Resultant left hemiparesis  Therapy needs:  CIR  Ongoing aggressive risk factor management  Risk factor education  Patient counseled to be compliant with his antithrombotic medications  Disposition:  CIR admission on saturday  Afib on coumadin - INR 1.7 after reversal, today 1.4 - coumadin on hold due to bleeding - monitor INR daily - continue coreg  Carotid stenosis - asymptomatic so far - repeat CUS  this tome - follow up as outpt  Hypertension   Home meds:  Norvasc, coreg, lasix. Resumed in hospital  BP 132-176 past 24h (06/13/2014 @ 10:31 PM)  SBP goal < 160  Stable  Patient counseled to be compliant with his blood pressure medications  Hyperlipidemia  Home meds:  lipitor. Resumed in hospital  LDL pending   LDL goal <70 for diabetics  Diabetes  Home meds:  levemir and saxagliptin  HgbA1c 6.7   Controlled  Goal < 7.0  On SSI  educated patient about lifestyle changes for diabetes treatment  Other Stroke Risk Factors Advanced age CKD CAD s/p CABG  Hospital day # 2  Rosalin Hawking, MD PhD Stroke Neurology 06/13/2014 10:31 PM      To contact Stroke Continuity provider, please refer to [email or web address] After hours, contact General Neurology

## 2014-06-13 NOTE — Progress Notes (Signed)
Received message to talk to the patient's son about [place] options; CM asked the patient for permission to talk to his son; Patient stated "No, I make all of the decisions for me, I am not going to let my children take over me.....ibuprofen am going to rehab, not a nursing home." CM informed the patient that we will not contact his son without his permission for DCPAneta Mins [phone]

## 2014-06-13 NOTE — Progress Notes (Addendum)
Physical Therapy Treatment Patient Details Name: [name] MRN: [id number] DOB: 1930-12-06 Today's Date: 06/14/2014    History of Present Illness      PT Comments    Progressing slowly.  Emphasized bed mobility and tranfer technique with balance EOB,   Follow Up Recommendations        Equipment Recommendations       Recommendations for Other Services       Precautions / Restrictions Restrictions Weight Bearing Restrictions: No    Mobility  Bed Mobility                  Transfers                    Ambulation/Gait                 Stairs            Wheelchair Mobility    Modified Rankin (Stroke Patients Only)       Balance                                    Cognition                            Exercises      General Comments        Pertinent Vitals/Pain      Home Living                      Prior Function            PT Goals (current goals can now be found in the care plan section)      Frequency       PT Plan      Co-evaluation             End of Session           Time:  -     Charges:                       G Codes:      Gibbs Naugle, Tessie Fass 06/14/2014, 11:23 AM 06/14/2014  Donnella Sham, PT [phone]  (pager)

## 2014-06-13 NOTE — Clinical Social Work Note (Signed)
CSW meet with the pt at the bedside. CSW introduce self and purpose of visit. CSW attempted to explain SNF rehab as a back up to going to inpatient rehab. Pt represent with an irritable mood. "If I can't get into inpatient rehab then I'm going home. I'm not going to skilled facility for rehab. I know my insurance will pay for someone to come into my home to work with me." The pt explained that he is interest in home health services.  CSW and pt concluded their discussion. CSW informed Hassan Rowan, case manager regarding the pt's interest in home health service.   [place], MSW, [place]

## 2014-06-13 NOTE — Progress Notes (Signed)
  Echocardiogram 2D Echocardiogram has been performed.  [name] 06/13/2014, 2:05 PM

## 2014-06-14 ENCOUNTER — Inpatient Hospital Stay (HOSPITAL_COMMUNITY)
Admission: RE | Admit: 2014-06-14 | Discharge: 2014-07-12 | DRG: 057 | Disposition: A | Discharge status: Skilled Nursing Facility | Payer: Medicare HMO | Source: Intra-hospital | Attending: Physical Medicine & Rehabilitation | Admitting: Physical Medicine & Rehabilitation

## 2014-06-14 DIAGNOSIS — N185 Chronic kidney disease, stage 5: Secondary | ICD-10-CM | POA: Diagnosis present

## 2014-06-14 DIAGNOSIS — I130 Hypertensive heart and chronic kidney disease with heart failure and stage 1 through stage 4 chronic kidney disease, or unspecified chronic kidney disease: Secondary | ICD-10-CM | POA: Diagnosis present

## 2014-06-14 DIAGNOSIS — Z7901 Long term (current) use of anticoagulants: Secondary | ICD-10-CM

## 2014-06-14 DIAGNOSIS — E1140 Type 2 diabetes mellitus with diabetic neuropathy, unspecified: Secondary | ICD-10-CM | POA: Diagnosis present

## 2014-06-14 DIAGNOSIS — Z79899 Other long term (current) drug therapy: Secondary | ICD-10-CM

## 2014-06-14 DIAGNOSIS — D472 Monoclonal gammopathy: Secondary | ICD-10-CM | POA: Diagnosis present

## 2014-06-14 DIAGNOSIS — Z951 Presence of aortocoronary bypass graft: Secondary | ICD-10-CM

## 2014-06-14 DIAGNOSIS — R531 Weakness: Secondary | ICD-10-CM | POA: Diagnosis present

## 2014-06-14 DIAGNOSIS — I5032 Chronic diastolic (congestive) heart failure: Secondary | ICD-10-CM

## 2014-06-14 DIAGNOSIS — K219 Gastro-esophageal reflux disease without esophagitis: Secondary | ICD-10-CM | POA: Diagnosis present

## 2014-06-14 DIAGNOSIS — L97419 Non-pressure chronic ulcer of right heel and midfoot with unspecified severity: Secondary | ICD-10-CM | POA: Diagnosis not present

## 2014-06-14 DIAGNOSIS — E785 Hyperlipidemia, unspecified: Secondary | ICD-10-CM | POA: Diagnosis present

## 2014-06-14 DIAGNOSIS — I619 Nontraumatic intracerebral hemorrhage, unspecified: Secondary | ICD-10-CM

## 2014-06-14 DIAGNOSIS — I69154 Hemiplegia and hemiparesis following nontraumatic intracerebral hemorrhage affecting left non-dominant side: Principal | ICD-10-CM

## 2014-06-14 DIAGNOSIS — Z87891 Personal history of nicotine dependence: Secondary | ICD-10-CM

## 2014-06-14 DIAGNOSIS — Z794 Long term (current) use of insulin: Secondary | ICD-10-CM | POA: Diagnosis not present

## 2014-06-14 DIAGNOSIS — I610 Nontraumatic intracerebral hemorrhage in hemisphere, subcortical: Secondary | ICD-10-CM

## 2014-06-14 DIAGNOSIS — D509 Iron deficiency anemia, unspecified: Secondary | ICD-10-CM

## 2014-06-14 DIAGNOSIS — G8110 Spastic hemiplegia affecting unspecified side: Secondary | ICD-10-CM | POA: Diagnosis present

## 2014-06-14 DIAGNOSIS — I2721 Secondary pulmonary arterial hypertension: Secondary | ICD-10-CM

## 2014-06-14 DIAGNOSIS — E1142 Type 2 diabetes mellitus with diabetic polyneuropathy: Secondary | ICD-10-CM | POA: Diagnosis present

## 2014-06-14 DIAGNOSIS — I613 Nontraumatic intracerebral hemorrhage in brain stem: Secondary | ICD-10-CM

## 2014-06-14 DIAGNOSIS — N2581 Secondary hyperparathyroidism of renal origin: Secondary | ICD-10-CM | POA: Diagnosis present

## 2014-06-14 DIAGNOSIS — E1121 Type 2 diabetes mellitus with diabetic nephropathy: Secondary | ICD-10-CM

## 2014-06-14 DIAGNOSIS — I2510 Atherosclerotic heart disease of native coronary artery without angina pectoris: Secondary | ICD-10-CM | POA: Diagnosis present

## 2014-06-14 DIAGNOSIS — I4891 Unspecified atrial fibrillation: Secondary | ICD-10-CM

## 2014-06-14 DIAGNOSIS — I10 Essential (primary) hypertension: Secondary | ICD-10-CM

## 2014-06-14 DIAGNOSIS — R002 Palpitations: Secondary | ICD-10-CM | POA: Diagnosis present

## 2014-06-14 DIAGNOSIS — I509 Heart failure, unspecified: Secondary | ICD-10-CM

## 2014-06-14 DIAGNOSIS — Z5189 Encounter for other specified aftercare: Secondary | ICD-10-CM

## 2014-06-14 DIAGNOSIS — M79673 Pain in unspecified foot: Secondary | ICD-10-CM

## 2014-06-14 LAB — BASIC METABOLIC PANEL
Anion gap: 15 (ref 5–15)
BUN: 42 mg/dL — AB (ref 6–23)
CALCIUM: 9.7 mg/dL (ref 8.4–10.5)
CO2: 21 mEq/L (ref 19–32)
Chloride: 102 mEq/L (ref 96–112)
Creatinine, Ser: 2.68 mg/dL — ABNORMAL HIGH (ref 0.50–1.35)
GFR calc Af Amer: 24 mL/min — ABNORMAL LOW (ref >90)
GFR calc non Af Amer: 21 mL/min — ABNORMAL LOW (ref >90)
GLUCOSE: 133 mg/dL — AB (ref 70–99)
Potassium: 4.3 mEq/L (ref 3.7–5.3)
SODIUM: 138 meq/L (ref 137–147)

## 2014-06-14 LAB — GLUCOSE, CAPILLARY
GLUCOSE-CAPILLARY: 199 mg/dL — AB (ref 70–99)
GLUCOSE-CAPILLARY: 160 mg/dL — AB (ref 70–99)
Glucose-Capillary: 133 mg/dL — ABNORMAL HIGH (ref 70–99)
Glucose-Capillary: 135 mg/dL — ABNORMAL HIGH (ref 70–99)
Glucose-Capillary: 157 mg/dL — ABNORMAL HIGH (ref 70–99)

## 2014-06-14 LAB — CBC
HCT: 30.8 % — ABNORMAL LOW (ref 39.0–52.0)
HEMOGLOBIN: 10.2 g/dL — AB (ref 13.0–17.0)
MCH: 28.4 pg (ref 26.0–34.0)
MCHC: 33.1 g/dL (ref 30.0–36.0)
MCV: 85.8 fL (ref 78.0–100.0)
Platelets: 82 10*3/uL — ABNORMAL LOW (ref 150–400)
RBC: 3.59 MIL/uL — ABNORMAL LOW (ref 4.22–5.81)
RDW: 17.2 % — ABNORMAL HIGH (ref 11.5–15.5)
WBC: 5.5 10*3/uL (ref 4.0–10.5)

## 2014-06-14 MED ORDER — CETYLPYRIDINIUM CHLORIDE 0.05 % MT LIQD
7.0000 mL | Freq: Two times a day (BID) | OROMUCOSAL | Status: DC
  Administered 2014-06-14 – 2014-07-12 (×55): 7 mL via OROMUCOSAL

## 2014-06-14 MED ORDER — ACETAMINOPHEN 325 MG PO TABS
650.0000 mg | ORAL_TABLET | ORAL | Status: DC | PRN
  Administered 2014-06-20 – 2014-07-11 (×10): 650 mg via ORAL
  Filled 2014-06-14 (×11): qty 2

## 2014-06-14 MED ORDER — HEPARIN SODIUM (PORCINE) 5000 UNIT/ML IJ SOLN: 5000.0000 [IU] | Freq: Three times a day (TID) | INTRAMUSCULAR | Status: DC

## 2014-06-14 MED ORDER — BISACODYL 10 MG RE SUPP: 10.0000 mg | Freq: Every day | RECTAL | Status: DC | PRN

## 2014-06-14 MED ORDER — POTASSIUM CHLORIDE CRYS ER 20 MEQ PO TBCR
20.0000 meq | EXTENDED_RELEASE_TABLET | Freq: Two times a day (BID) | ORAL | Status: DC
  Administered 2014-06-14 – 2014-07-04 (×41): 20 meq via ORAL
  Filled 2014-06-14 (×46): qty 1

## 2014-06-14 MED ORDER — POLYETHYLENE GLYCOL 3350 17 G PO PACK
17.0000 g | PACK | Freq: Every day | ORAL | Status: DC
  Administered 2014-06-15 – 2014-07-12 (×20): 17 g via ORAL
  Filled 2014-06-14 (×29): qty 1

## 2014-06-14 MED ORDER — SENNOSIDES-DOCUSATE SODIUM 8.6-50 MG PO TABS
1.0000 | ORAL_TABLET | Freq: Two times a day (BID) | ORAL | Status: DC
  Administered 2014-06-14 – 2014-07-12 (×46): 1 via ORAL
  Filled 2014-06-14 (×45): qty 1

## 2014-06-14 MED ORDER — AMLODIPINE BESYLATE 10 MG PO TABS
10.0000 mg | ORAL_TABLET | Freq: Every day | ORAL | Status: DC
  Administered 2014-06-15 – 2014-07-12 (×29): 10 mg via ORAL
  Filled 2014-06-14 (×31): qty 1

## 2014-06-14 MED ORDER — ONDANSETRON HCL 4 MG/2ML IJ SOLN
4.0000 mg | Freq: Four times a day (QID) | INTRAMUSCULAR | Status: DC | PRN
Start: 2014-06-14 — End: 2014-07-12

## 2014-06-14 MED ORDER — INSULIN ASPART 100 UNIT/ML ~~LOC~~ SOLN
0.0000 [IU] | Freq: Three times a day (TID) | SUBCUTANEOUS | Status: DC
  Administered 2014-06-15 (×2): 3 [IU] via SUBCUTANEOUS
  Administered 2014-06-16: 2 [IU] via SUBCUTANEOUS
  Administered 2014-06-17: 5 [IU] via SUBCUTANEOUS
  Administered 2014-06-17: 3 [IU] via SUBCUTANEOUS
  Administered 2014-06-18 (×2): 2 [IU] via SUBCUTANEOUS
  Administered 2014-06-18: 3 [IU] via SUBCUTANEOUS
  Administered 2014-06-19: 2 [IU] via SUBCUTANEOUS
  Administered 2014-06-20 – 2014-06-22 (×6): 3 [IU] via SUBCUTANEOUS
  Administered 2014-06-23 – 2014-06-24 (×3): 2 [IU] via SUBCUTANEOUS
  Administered 2014-06-24: 3 [IU] via SUBCUTANEOUS
  Administered 2014-06-25 – 2014-06-26 (×4): 2 [IU] via SUBCUTANEOUS
  Administered 2014-06-26 – 2014-06-27 (×2): 3 [IU] via SUBCUTANEOUS
  Administered 2014-06-27: 2 [IU] via SUBCUTANEOUS
  Administered 2014-06-28: 3 [IU] via SUBCUTANEOUS
  Administered 2014-06-28: 5 [IU] via SUBCUTANEOUS
  Administered 2014-06-29 – 2014-06-30 (×3): 2 [IU] via SUBCUTANEOUS
  Administered 2014-07-01: 5 [IU] via SUBCUTANEOUS
  Administered 2014-07-01 – 2014-07-02 (×2): 2 [IU] via SUBCUTANEOUS
  Administered 2014-07-02 – 2014-07-04 (×4): 3 [IU] via SUBCUTANEOUS
  Administered 2014-07-04 – 2014-07-06 (×5): 2 [IU] via SUBCUTANEOUS
  Administered 2014-07-07: 1 [IU] via SUBCUTANEOUS
  Administered 2014-07-07 – 2014-07-08 (×2): 2 [IU] via SUBCUTANEOUS
  Administered 2014-07-08: 3 [IU] via SUBCUTANEOUS
  Administered 2014-07-09: 2 [IU] via SUBCUTANEOUS
  Administered 2014-07-09 – 2014-07-10 (×2): 3 [IU] via SUBCUTANEOUS
  Administered 2014-07-11 – 2014-07-12 (×3): 2 [IU] via SUBCUTANEOUS

## 2014-06-14 MED ORDER — SORBITOL 70 % SOLN: 30.0000 mL | Freq: Every day | Status: DC | PRN

## 2014-06-14 MED ORDER — ATORVASTATIN CALCIUM 20 MG PO TABS
20.0000 mg | ORAL_TABLET | Freq: Every day | ORAL | Status: DC
  Administered 2014-06-15 – 2014-07-11 (×27): 20 mg via ORAL
  Filled 2014-06-14 (×28): qty 1

## 2014-06-14 MED ORDER — PANTOPRAZOLE SODIUM 40 MG PO TBEC
40.0000 mg | DELAYED_RELEASE_TABLET | Freq: Every day | ORAL | Status: DC
  Administered 2014-06-15 – 2014-07-12 (×28): 40 mg via ORAL
  Filled 2014-06-14 (×26): qty 1

## 2014-06-14 MED ORDER — CARVEDILOL 6.25 MG PO TABS
6.2500 mg | ORAL_TABLET | Freq: Two times a day (BID) | ORAL | Status: DC
  Administered 2014-06-14 – 2014-07-12 (×51): 6.25 mg via ORAL
  Filled 2014-06-14 (×60): qty 1

## 2014-06-14 MED ORDER — HEPARIN SODIUM (PORCINE) 5000 UNIT/ML IJ SOLN
5000.0000 [IU] | Freq: Two times a day (BID) | INTRAMUSCULAR | Status: DC
  Administered 2014-06-14 – 2014-07-12 (×56): 5000 [IU] via SUBCUTANEOUS
  Filled 2014-06-14 (×57): qty 1

## 2014-06-14 MED ORDER — LINAGLIPTIN 5 MG PO TABS
5.0000 mg | ORAL_TABLET | Freq: Every day | ORAL | Status: DC
  Administered 2014-06-15 – 2014-07-12 (×28): 5 mg via ORAL
  Filled 2014-06-14 (×31): qty 1

## 2014-06-14 MED ORDER — FUROSEMIDE 80 MG PO TABS
80.0000 mg | ORAL_TABLET | Freq: Every day | ORAL | Status: DC
  Administered 2014-06-15 – 2014-06-29 (×15): 80 mg via ORAL
  Filled 2014-06-14 (×16): qty 1

## 2014-06-14 MED ORDER — ONDANSETRON HCL 4 MG PO TABS
4.0000 mg | ORAL_TABLET | Freq: Four times a day (QID) | ORAL | Status: DC | PRN
Start: 2014-06-14 — End: 2014-07-12

## 2014-06-14 NOTE — Progress Notes (Signed)
Physical Therapy Treatment Patient Details Name: [name] MRN: [id number] DOB: Apr 09, 1931 Today's Date: 06/14/2014    History of Present Illness Pt presents with L sided weakness and dysarthric speech s/p R pontomedullary hemorrhage starting 4 days prior to admission. Pt with hx of CAD, CHF, a-fib, HTN, and DM.     PT Comments    Progressing well.  Emphasis on scoot transfers, standing and pregait activity at EOB and transfer technique.  Follow Up Recommendations  CIR     Equipment Recommendations       Recommendations for Other Services Rehab consult     Precautions / Restrictions Precautions Precautions: Fall Restrictions Weight Bearing Restrictions: No    Mobility  Bed Mobility Overal bed mobility: Needs Assistance Bed Mobility: Supine to Sit     Supine to sit: Mod assist (HOB flat)     General bed mobility comments: scooted via bridging to EOB with min A; truncal assist to come forward.  Transfers Overall transfer level: Needs assistance Equipment used: 2 person hand held assist;Ambulation equipment used Transfers: Sit to/from [employer] Sit to Stand: Max assist;+2 physical assistance;+2 safety/equipment (times 3 trials)   Squat pivot transfers: Mod assist;+2 physical assistance     General transfer comment: cues for hand placement and safety; significant assist to  come forward.  Ambulation/Gait                 Stairs            Wheelchair Mobility    Modified Rankin (Stroke Patients Only) Modified Rankin (Stroke Patients Only) Pre-Morbid Rankin Score: No symptoms Modified Rankin: Severe disability     Balance Overall balance assessment: Needs assistance Sitting-balance support: No upper extremity supported Sitting balance-Leahy Scale: Fair Sitting balance - Comments: pt can accept minimal challenge given time to accommodate   Standing balance support: Single extremity supported;Bilateral upper extremity  supported Standing balance-Leahy Scale: Zero Standing balance comment: stood in 3 trial-- progressively lengthened times fro 25 secs to 3-4 min.  Working on upright stance knee control w/shifting.  Still needing significant truncal assist                    Cognition Arousal/Alertness: Awake/alert Behavior During Therapy: WFL for tasks assessed/performed Overall Cognitive Status: Impaired/Different from baseline Area of Impairment: Following commands;Problem solving;Safety/judgement   Current Attention Level: Selective   Following Commands: Follows one step commands inconsistently;Follows one step commands with increased time Safety/Judgement: Decreased awareness of deficits;Decreased awareness of safety   Problem Solving: Slow processing;Decreased initiation;Difficulty sequencing;Requires verbal cues;Requires tactile cues      Exercises      General Comments        Pertinent Vitals/Pain Pain Assessment: No/denies pain    Home Living                      Prior Function            PT Goals (current goals can now be found in the care plan section) Acute Rehab PT Goals Patient Stated Goal: to get stronger and go to rehab (CIR) Time For Goal Achievement: 06/26/14 Potential to Achieve Goals: Good Progress towards PT goals: Progressing toward goals    Frequency  Min 4X/week    PT Plan Current plan remains appropriate    Co-evaluation             End of Session   Activity Tolerance: Patient tolerated treatment well Patient left: in chair;with call bell/phone  within reach     Time: 1210-1241 PT Time Calculation (min): 31 min  Charges:  $Therapeutic Activity: 23-37 mins                    G Codes:      Elah Avellino, Tessie Fass 06/14/2014, 12:57 PM 06/14/2014  Donnella Sham, PT [phone]  (pager)

## 2014-06-14 NOTE — Discharge Summary (Signed)
Stroke Discharge Summary  Patient ID: [name]   MRN: [id number]      DOB: 1930/10/30  Date of Admission: 06/11/2014 Date of Discharge: 06/14/2014  Attending Physician:  Rosalin Hawking, MD, Stroke MD  Consulting Physician(s):    rehabilitation medicine  Patient's PCP:  Adella Hare, MD  Discharge Diagnoses: Active Problems:   Stroke due to intracerebral hemorrhage   afib on coumadin  BMI  Body mass index is 24.96 kg/(m^2).   Past Medical History  Diagnosis Date  . CAD (coronary artery disease)     s/p 3v CABG 2006, myoview 04/2010 EF 49%, prior inferior/apical infarct, no ischemia, LHC 11/2011 stable anatomy (occluded LAD filled from vein graft, distal LCx occluded prior to OM2, OM2 filled from vein graft, RCA prox 25%, mid 40%, distal 25% lesions, SVG to diagonal occluded) Med Rx  . Chronic diastolic heart failure     hx of cardiorenal syndrome;  Echo 06/2012 EF 61-44%, mod diastolic dysfunction, mild MR, mod TR, mild R/LAE, mild RV dilatation, PASP 36mmHg.  . Carotid stenosis     a. Carotid dopplers R 40-59%, Left 60-79%;   b. carotids 12/13:  3-15% RICA, 40-08% LICA (rpt in 6 mos);  c.  Carotid US (11/14):  R 40-59%; L 60-79%, R vertebral occluded, L vertebral antegrade  . HLD (hyperlipidemia)   . Mediastinal adenopathy     per CT chest in 2006 with PET scan showing very limited metabolic activity; not felt to have a significant neoplastic potential  . Iron deficiency anemia   . Hypertension   . Diabetes mellitus     insulin dependent   . GERD (gastroesophageal reflux disease)   . AV block, Mobitz 1     Noted 11/2011 in hospital, BB stopped  . Colon polyps   . Diverticulosis   . Pulmonary arterial hypertension     a.  RHC 11/1 PASP 60 mmHg (mean 38); b. RHC 11/15 PA pressure 66/24, PCWP 26 and CO 6.2 => Sildenafil started;   c. Echo bubble study 11/13:  no obvious shunt;   d. VQ scan neg for pulmonary embolism  . CKD (chronic kidney disease) stage 5, GFR less than 15  ml/min     hx of cardiorenal syndrome;  Dr Posey Pronto;  AVF recommended and scheduled (patient hesitant to proceed);   Renal US 11/13: diff echogenic kidneys c/w medical renal disease; no hydronephrosis or renal mass  . Hyperparathyroidism, secondary renal   . MGUS (monoclonal gammopathy of unknown significance)     a. Kappa/Lambda free light chain ratio 11/13:  23.99;  b. Met. Bone survey 11/13:  no osteolytic lesions to suggest MMyeloma  . Atrial flutter     diagnosed 05/2013; Coumadin initiated   Past Surgical History  Procedure Laterality Date  . Appendectomy    . Coronary artery bypass graft  2006    SVG to OM2, SVG to LAD, SVG to DX; (the LIMA did not have good flow and therefore was not used)  . Cardiac catheterization  12/21/2011    Medications to be continued on Rehab . amLODipine  10 mg Oral Daily  . atorvastatin  20 mg Oral q1800  . carvedilol  6.25 mg Oral BID  . furosemide  80 mg Oral Daily  . heparin subcutaneous  5,000 Units Subcutaneous Q12H  . insulin aspart  0-15 Units Subcutaneous TID WC  . linagliptin  5 mg Oral Daily  . pantoprazole  40 mg Oral Daily  . polyethylene  glycol  17 g Oral Daily  . potassium chloride SA  20 mEq Oral BID  . senna-docusate  1 tablet Oral BID    LABORATORY STUDIES CBC    Component Value Date/Time   WBC 5.5 06/14/2014 0633   RBC 3.59* 06/14/2014 0633   HGB 10.2* 06/14/2014 0633   HCT 30.8* 06/14/2014 0633   PLT 82* 06/14/2014 0633   MCV 85.8 06/14/2014 0633   MCH 28.4 06/14/2014 0633   MCHC 33.1 06/14/2014 0633   RDW 17.2* 06/14/2014 0633   LYMPHSABS 1.1 06/11/2014 1407   MONOABS 0.2 06/11/2014 1407   EOSABS 0.0 06/11/2014 1407   BASOSABS 0.0 06/11/2014 1407   CMP    Component Value Date/Time   NA 138 06/14/2014 0633   K 4.3 06/14/2014 0633   CL 102 06/14/2014 0633   CO2 21 06/14/2014 0633   GLUCOSE 133* 06/14/2014 0633   BUN 42* 06/14/2014 0633   CREATININE 2.68* 06/14/2014 0633   CREATININE 1.89* 12/20/2011 1233   CALCIUM 9.7 06/14/2014  0633   PROT 8.2 06/11/2014 1407   ALBUMIN 3.9 06/11/2014 1407   AST 20 06/11/2014 1407   ALT 11 06/11/2014 1407   ALKPHOS 103 06/11/2014 1407   BILITOT 0.7 06/11/2014 1407   GFRNONAA 21* 06/14/2014 0633   GFRAA 24* 06/14/2014 0633   COAGS Lab Results  Component Value Date   INR 1.42 06/13/2014   INR 1.70* 06/12/2014   INR 2.10* 06/11/2014   Lipid Panel    Component Value Date/Time   CHOL 109 06/13/2014 0425   TRIG 61 06/13/2014 0425   HDL 38* 06/13/2014 0425   CHOLHDL 2.9 06/13/2014 0425   VLDL 12 06/13/2014 0425   LDLCALC 59 06/13/2014 0425   HgbA1C  Lab Results  Component Value Date   HGBA1C 6.7* 06/11/2014   Cardiac Panel (last 3 results) No results found for this basename: CKTOTAL, CKMB, TROPONINI, RELINDX,  in the last 72 hours Urinalysis    Component Value Date/Time   COLORURINE YELLOW 06/28/2013 2120   APPEARANCEUR CLEAR 06/28/2013 2120   LABSPEC 1.013 06/28/2013 2120   PHURINE 6.5 06/28/2013 2120   GLUCOSEU NEGATIVE 06/28/2013 2120   HGBUR SMALL* 06/28/2013 2120   BILIRUBINUR NEGATIVE 06/28/2013 2120   KETONESUR NEGATIVE 06/28/2013 2120   PROTEINUR 100* 06/28/2013 2120   UROBILINOGEN 0.2 06/28/2013 2120   NITRITE NEGATIVE 06/28/2013 2120   LEUKOCYTESUR NEGATIVE 06/28/2013 2120   Urine Drug Screen  No results found for this basename: labopia, cocainscrnur, labbenz, amphetmu, thcu, labbarb    Alcohol Level No results found for this basename: eth    SIGNIFICANT DIAGNOSTIC STUDIES Ct Head Wo Contrast  06/12/2014 IMPRESSION: 1. The known hemorrhagic focus within the anterior and right aspect of the pons has not increased in size and is in fact slightly less conspicuous today. No intracranial hemorrhage otherwise is demonstrated. 2. There are stable changes of chronic small vessel ischemia and diffuse atrophy.  Ct Head (brain) Wo Contrast  06/11/2014 IMPRESSION: Superimposed on chronic hypertensive involutional change, there is a 22 x 14 mm hyper attenuating focus in the right  pontomedullary junction most consistent with acute hemorrhage.  2D ehco - - Normal LV size with mild LV hypertrophy. EF 50-55%. Inferior hypokinesis. D-shaped interventricular septum suggesting RV pressure/volume overload. Mildly dilated RV with normal systolic function. Severe pulmonary hypertension.  CUS - 02/2014 -  Heterogeneous plaque, bilaterally.  Stable, 40-59% RICA stenosis.  Stable, 60-79% LICA stenosis.  Occluded right vertebral artery.  Patent left vertebral artery  with antegrade flow.  Normal subclavian arteries, bilaterally.     HISTORY OF PRESENT ILLNES [name] is an 80 y.o. male with PMH significant for HTN, hyperlipidemia, DM, CAD s/p CABG, atrial flutter on coumadin, CKD, MGUS, brought in by family for further evaluation of the above stated symptoms.  Patient stated that he started having " weakness in my legs, mainly the left leg and also in my left arm" several days ago which impeded normal walking to the point that he had a fall in which did not injured his head. [name] addition, his son said that he has been noticing slurred speech and droopiness of his left face since this past Friday.  Denies HA, vertigo, double vision, difficulty swallowing, confusion, or visual disturbances.  CT brain today showed a right pontomedullary hematoma measuring 14 x 22. Neurosurgery was consulted by the ED and not aggressive neurosurgical intervention was recommended.  INR 2.10  SBP 160 in the ED and received 10 mg IV labetalol.  Date last known well: unclear  Time last known well: unclear  tPA Given: no, [place] During hospitalization, he was stable. Repeat CT head showed no expansion of hematoma. INR repeat back to normal range. He was evaluated by PT/OT and recommend inpt rehab. He was discharged in good condition. Will need to follow up in clinic to repeat CT head, if negative of blood product, will consider resarted coumadin or NOAC.   ICH at right pontomedullary  junction secondary to anticoagulation use  warfarin prior to admission, now on no antithrombotics  2D Echo pending  LDL pending  HgbA1c 6.7 borderline  SCDs for VTE prophylaxis  Carb Control  Activity as tolerated  Resultant left hemiparesis  Therapy needs: CIR Ongoing aggressive risk factor management Risk factor education  Patient counseled to be compliant with his antithrombotic medications  Disposition: CIR  Afib on coumadin  - INR 1.7 after reversal, today 1.4  - coumadin on hold due to bleeding  - monitor INR daily  - continue coreg  - Will need to follow up in clinic to repeat CT head, if negative of blood product, will consider resarted coumadin or NOAC  Carotid stenosis  - asymptomatic so far  - repeat CUS this tome  - follow up as outpt   Hypertension  Home meds: Norvasc, coreg, lasix. Resumed in hospital BP 132-176 past 24h (06/13/2014 @ 10:31 PM) SBP goal < 160  Stable  Patient counseled to be compliant with his blood pressure medications  Hyperlipidemia  Home meds: lipitor. Resumed in hospital  LDL pending  LDL goal <70 for diabetics  Diabetes  Home meds: levemir and saxagliptin  HgbA1c 6.7  Controlled  Goal < 7.0  On SSI  educated patient about lifestyle changes for diabetes treatment  Other Stroke Risk Factors  Advanced age  CKD  CAD s/p CABG  DISCHARGE EXAM Blood pressure 150/64, pulse 67, temperature 98.1 F (36.7 C), temperature source Oral, resp. rate 18, height $RemoveBe'5\' 11"'vsFfCAKGi$  (1.803 m), weight 178 lb 14.4 oz (81.149 kg), SpO2 94.00%.   General - Well nourished, well developed, in no apparent distress.  Ophthalmologic - not able to see through.  Cardiovascular - Regular rate and rhythm with no murmur.  Mental Status -  Level of arousal and orientation to time, place, and person were intact.  Language including expression, naming, repetition, comprehension was assessed and found intact, but mildly dysarthric.  Cranial Nerves II - XII -  II -  Visual field intact  OU.  III, IV, VI - Extraocular movements intact.  V - Facial sensation intact bilaterally.  VII - left facial droop.  VIII - Hearing & vestibular intact bilaterally.  X - Palate elevates symmetrically, but mildly dysarthric.  XI - Chin turning & shoulder shrug intact bilaterally.  XII - Tongue protrusion intact.  Motor Strength - The patient's strength was LUE 3/5 proximal and 4/5 distal, LLE 3/5, RUE and RLE 5/5 and pronator drift was present. Bulk was normal and fasciculations were absent.  Motor Tone - Muscle tone was assessed at the neck and appendages and was normal.  Reflexes - The patient's reflexes were 1+ in all extremities and he had left babinski positive.  Sensory - Light touch, temperature/pinprick were assessed and were normal.  Coordination - The patient had normal movements in the hands with no ataxia or dysmetria. Tremor was absent.  Gait and Station - not tested.   Discharge Diet  Carb Control   DISCHARGE PLAN  Disposition:  Transfer to [place] for ongoing PT, OT and ST  no antithrombotics  for secondary stroke prevention. Will need to follow up in clinic to repeat CT head, if negative of blood product, will consider resarted coumadin or NOAC  Recommend ongoing risk factor control by Primary Care Physician at time of discharge from inpatient rehabilitation. Risk factor recommendations:  Hypertension target range 130-140/70-80 Lipid range - LDL < 100 and checked every 6 months, fasting Diabetes - HgB A1C <7 Smoking cessation   Follow-up Adella Hare, MD in 2 weeks following discharge from rehab.  Follow-up with Dr. Erlinda Hong, [facility] in 2 months.  35 minutes were spent preparing discharge.  Signed  Rosalin Hawking, MD PhD Stroke Neurology 06/14/2014 11:14 PM

## 2014-06-14 NOTE — PMR Pre-admission (Signed)
PMR Admission Coordinator Pre-Admission Assessment  Patient: [name] is an 80 y.o., male MRN: [id number] DOB: 1931-08-21 Height: _0  (180.3 cm) Weight: 81.149 kg (178 lb 14.4 oz)              Insurance Information HMO: yes    PPO:      PCP:      IPA:      80/20:      OTHER:  PRIMARY: [employer] Medicare      Policy#: [id number]      Subscriber: self CM Name: Su Hoff, RN      Phone#: [phone], ext [phone]     Fax#: [phone] Authorization given for seven days from 06-14-14 to 06-20-14 with updates due to [facility] on 06-19-14. Pre-Cert#: [id number]      Employer: retired Benefits:  Phone #: [phone]     Name: Grace Isaac. Date: 02-25-14     Deduct: none      Out of Pocket Max: [phone] (met $91.19)      Life Max: unlimited CIR: $240/copay for days 1-6 (up to max for $1440 for admission)      SNF: $0/day for days 1-20; $140 copay/day for days 21-100; visit limit based on medical necessity Outpatient: 100%     Co-Pay: $40 copay Home Health: 100%      Co-Pay: visit limit based on medical necessity DME: 80%     Co-Pay: 20% Providers: in network  Emergency Contact Information Contact Information   Name Relation Home Work Mobile   [place] Daughter   [phone]   [place], Goshorn   [phone]     Current Medical History  Patient Admitting Diagnosis: Right pontomedullary hematoma  History of Present Illness: [name] is a 80 y.o. right-handed male with history of CAD status post CABG, atrial flutter on chronic Coumadin, chronic renal insufficiency with baseline creatinine 0.08, chronic diastolic congestive heart failure. Independent with occasional cane and driving living with family. Admitted 06/11/2049 of left-sided weakness and slurred speech as well as recent fall but denies striking his head. Cranial CT scan shows a right pontomedullary 14 x 22 mm oval focus consistent with acute hemorrhage. INR on admission of 6.76 and systolic blood pressure 195. Placed on  intravenous Lopressor and Coumadin was reversed. Followup cranial CT scan unchanged. 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 Tolerating a regular consistency diet. Physical therapy evaluation completed 06/12/2014 with recommendations of physical medicine and rehabilitation consult.  NIH Total: 5  Past Medical History  Past Medical History  Diagnosis Date  . CAD (coronary artery disease)     s/p 3v CABG 2006, myoview 04/2010 EF 49%, prior inferior/apical infarct, no ischemia, LHC 11/2011 stable anatomy (occluded LAD filled from vein graft, distal LCx occluded prior to OM2, OM2 filled from vein graft, RCA prox 25%, mid 40%, distal 25% lesions, SVG to diagonal occluded) Med Rx  . Chronic diastolic heart failure     hx of cardiorenal syndrome;  Echo 06/2012 EF 09-32%, mod diastolic dysfunction, mild MR, mod TR, mild R/LAE, mild RV dilatation, PASP 35mHg.  . Carotid stenosis     a. Carotid dopplers R 40-59%, Left 60-79%;   b. carotids 12/13:  06-71%RICA, 624-58%LICA (rpt in 6 mos);  c.  Carotid [place](11/14):  R 40-59%; L 60-79%, R vertebral occluded, L vertebral antegrade  . HLD (hyperlipidemia)   . Mediastinal adenopathy     per CT chest in 2006 with PET scan showing very limited metabolic activity; not felt  to have a significant neoplastic potential  . Iron deficiency anemia   . Hypertension   . Diabetes mellitus     insulin dependent   . GERD (gastroesophageal reflux disease)   . AV block, Mobitz 1     Noted 11/2011 in hospital, BB stopped  . Colon polyps   . Diverticulosis   . Pulmonary arterial hypertension     a.  RHC 11/1 PASP 60 mmHg (mean 38); b. RHC 11/15 PA pressure 66/24, PCWP 26 and CO 6.2 => Sildenafil started;   c. Echo bubble study 11/13:  no obvious shunt;   d. VQ scan neg for pulmonary embolism  . CKD (chronic kidney disease) stage 5, GFR less than 15 ml/min     hx of cardiorenal syndrome;  Dr Posey Pronto;  AVF recommended and scheduled (patient hesitant  to proceed);   Renal US 11/13: diff echogenic kidneys c/w medical renal disease; no hydronephrosis or renal mass  . Hyperparathyroidism, secondary renal   . MGUS (monoclonal gammopathy of unknown significance)     a. Kappa/Lambda free light chain ratio 11/13:  23.99;  b. Met. Bone survey 11/13:  no osteolytic lesions to suggest MMyeloma  . Atrial flutter     diagnosed 05/2013; Coumadin initiated    Family History  family history includes Lung cancer in his brother; Pancreatic cancer in his brother; Stomach cancer in his sister. There is no history of Colon cancer.  Prior Rehab/Hospitalizations: none   Current Medications  Current facility-administered medications:acetaminophen (TYLENOL) tablet 650 mg, 650 mg, Oral, Q4H PRN, Amie Portland, MD;  amLODipine (NORVASC) tablet 10 mg, 10 mg, Oral, Daily, Rosalin Hawking, MD, 10 mg at 06/14/14 1001;  atorvastatin (LIPITOR) tablet 20 mg, 20 mg, Oral, q1800, Rosalin Hawking, MD, 20 mg at 06/13/14 1703;  bisacodyl (DULCOLAX) suppository 10 mg, 10 mg, Rectal, Daily PRN, Rosalin Hawking, MD carvedilol (COREG) tablet 6.25 mg, 6.25 mg, Oral, BID, Rosalin Hawking, MD, 6.25 mg at 06/14/14 1001;  furosemide (LASIX) tablet 80 mg, 80 mg, Oral, Daily, Rosalin Hawking, MD, 80 mg at 06/14/14 1001;  heparin injection 5,000 Units, 5,000 Units, Subcutaneous, Q12H, Rosalin Hawking, MD, 5,000 Units at 06/14/14 1000;  insulin aspart (novoLOG) injection 0-15 Units, 0-15 Units, Subcutaneous, TID WC, Alexis Goodell, MD, 2 Units at 06/14/14 1155 labetalol (NORMODYNE,TRANDATE) injection 10-40 mg, 10-40 mg, Intravenous, Q2H PRN, Rosalin Hawking, MD, 20 mg at 06/14/14 0001;  linagliptin (TRADJENTA) tablet 5 mg, 5 mg, Oral, Daily, Rosalin Hawking, MD, 5 mg at 06/14/14 1000;  pantoprazole (PROTONIX) EC tablet 40 mg, 40 mg, Oral, Daily, Rosalin Hawking, MD, 40 mg at 06/14/14 1001;  polyethylene glycol (MIRALAX / GLYCOLAX) packet 17 g, 17 g, Oral, Daily, Rosalin Hawking, MD, 17 g at 06/14/14 1000 potassium chloride SA (K-DUR,KLOR-CON)  CR tablet 20 mEq, 20 mEq, Oral, BID, Rosalin Hawking, MD, 20 mEq at 06/14/14 1000;  senna-docusate (Senokot-S) tablet 1 tablet, 1 tablet, Oral, BID, Amie Portland, MD, 1 tablet at 06/14/14 1001  Patients Current Diet: Carb Control  Precautions / Restrictions Precautions Precautions: Fall Restrictions Weight Bearing Restrictions: No   Prior Activity Level Community (5-7x/wk): Pt got out every other day and was driving. He enjoys going to visit friends. Some friends bring meals to him. Pt lives with his son and his son is in/out of the house due to his variable work schedule.  Home Assistive Devices / Equipment Home Assistive Devices/Equipment: None Home Equipment: None  Prior Functional Level Prior Function Level of Independence: Independent with assistive device(s) Comments:  pt reports he used cane for long distance ambulation as needed  Current Functional Level Cognition  Arousal/Alertness: Awake/alert Overall Cognitive Status: Impaired/Different from baseline Current Attention Level: Selective Orientation Level: Oriented X4 Following Commands: Follows one step commands inconsistently;Follows one step commands with increased time Safety/Judgement: Decreased awareness of deficits;Decreased awareness of safety Attention: Sustained Sustained Attention: Appears intact Memory: Appears intact Awareness: Impaired Awareness Impairment: Anticipatory impairment Problem Solving: Impaired Problem Solving Impairment: Functional basic    Extremity Assessment (includes Sensation/Coordination)          ADLs  Overall ADL's : Needs assistance/impaired Eating/Feeding: Set up;Sitting Eating/Feeding Details (indicate cue type and reason): pt requires assist to cut food and open containers due to decreased functional use of LUE. Pt able to utilize R, dominant hand to feed self with some difficulty due to positioning challenges.  Grooming: Set up;Sitting Grooming Details (indicate cue type  and reason): sitting EOB with support, able to wipe face with Rt hand. Upper Body Bathing: Sitting;Moderate assistance General ADL Comments: Pt sat EOB x 10 minutes for exercises and grooming activities to facilitate trunk control. Pt also performed therapeutic exercises sitting EOB and demonstrated improved static sitting by the end of the session.     Mobility  Overal bed mobility: Needs Assistance Bed Mobility: Supine to Sit Supine to sit: Mod assist (HOB flat) Sit to supine: Mod assist;HOB elevated General bed mobility comments: scooted via bridging to EOB with min A; truncal assist to come forward.    Transfers  Overall transfer level: Needs assistance Equipment used: 2 person hand held assist;Ambulation equipment used Transfers: Sit to/from [employer] Sit to Stand: Max assist;+2 physical assistance;+2 safety/equipment (times 3 trials) Squat pivot transfers: Mod assist;+2 physical assistance General transfer comment: cues for hand placement and safety; significant assist to  come forward.    Ambulation / Gait / Stairs / Wheelchair Mobility    Not assessed at this time. Anticipate needs.   Posture / Balance Dynamic Sitting Balance Sitting balance - Comments: pt can accept minimal challenge given time to accommodate    Special needs/care consideration BiPAP/CPAP no CPM no  Continuous Drip IV no  Dialysis no          Life Vest no  Oxygen no  Special Bed no  Trach Size no Wound Vac (area) no        Skin - no issues                               Bowel mgmt: last BM was prior to admit Bladder mgmt:currently with condom cath Diabetic mgmt - yes, managed at home with meds   Previous Home Environment Living Arrangements: Children Available Help at Discharge: Family Type of Home: House Home Layout: Two level;Able to live on main level with bedroom/bathroom Home Access: Level entry Bathroom Shower/Tub: Chiropodist: [place]: No  Discharge Living Setting Plans for Discharge Living Setting: Patient's home Type of Home at Discharge: House Discharge Home Layout: Two level;Able to live on main level with bedroom/bathroom Alternate Level Stairs-Rails:  (pt stays downstairs) Discharge Home Access: Stairs to enter Entrance Stairs-Number of Steps: 1 Does the patient have any problems obtaining your medications?: No  Social/Family/Support Systems Patient Roles:  (enjoys going to his friend's houses to visit ) Contact Information: son Leeon Makar is primary contact Anticipated Caregiver: son (but note that pt's goals are for Mod Ind except for possible  supervision with OT) Anticipated Caregiver's Contact Information: see above Ability/Limitations of Caregiver: Son does work odd jobs and has a variable schedule.  Caregiver Availability: Intermittent Discharge Plan Discussed with Primary Caregiver: Yes Is Caregiver In Agreement with Plan?: Yes Does Caregiver/Family have Issues with Lodging/Transportation while Pt is in Rehab?: No  Note: pt was very clear in stating he did not need anyone's support to make decisions or to go home and is very independent at baseline. I explained to the pt that it was my role to clarify available family supports for pt at the end of a rehab stay.  Goals/Additional Needs Patient/Family Goal for Rehab: Mod Ind with PT/SLP and Mod Ind and supervision with OT Expected length of stay: 14-18 days Cultural Considerations: none Dietary Needs: carb modified, thin liquids Equipment Needs: to be determined Pt/Family Agrees to Admission and willing to participate: Yes (spoke with pt's son by phone and met pt's daughter on 06-14-14) Program Orientation Provided & Reviewed with Pt/Caregiver Including Roles  & Responsibilities: Yes   Decrease burden of Care through IP rehab admission: NA   Possible need for SNF placement upon discharge: not anticipated   Patient Condition: This  patient's medical and functional status has changed since the consult dated: 06-12-14 in which the Rehabilitation Physician determined and documented that the patient's condition is appropriate for intensive rehabilitative care in an inpatient rehabilitation facility. See "History of Present Illness" (above) for medical update. Functional changes are: Moderate to maximal assistance x 2 for sit to stand and limited transfers and moderate assistance with limited self care skills. Patient's medical and functional status update has been discussed with the Rehabilitation physician and patient remains appropriate for inpatient rehabilitation. Will admit to inpatient rehab today.  Preadmission Screen Completed By:  Nanetta Batty, PT, 06/14/2014 3:14 PM ______________________________________________________________________   Discussed status with Dr. Naaman Plummer on 06-14-14 at 1514 and received telephone approval for admission today.  Admission Coordinator:  Nanetta Batty, PT, time 1514/Date 06-14-14

## 2014-06-14 NOTE — Progress Notes (Signed)
Speech Language Pathology Treatment: Cognitive-Linquistic  Patient Details Name: [name] MRN: [id number] DOB: 09-05-1931 Today's Date: 06/14/2014 Time: 5726-2035 SLP Time Calculation (min): 19 min  Assessment / Plan / Recommendation Clinical Impression  Pt was independently oriented to today's date. SLP provided written aid for speech intelligibility strategies, although patient acknowledged difficulty seeing the page, reading aloud only the bottom half. Pt required Mod cues from SLP for basic problem solving to reposition the page so that he could read the top lines. Even with improved ability to see the page, patient had difficulty accurately reading aloud short phrases (2-3 words), although patient denies any increased difficulty from his baseline reading skills. SLP provided verbal education regarding strategies with demonstrations. Pt utilized his strategies at the short phrase level with Max faded to Mod cues from SLP. Will continue to follow.   HPI HPI: [name] is an 80 y.o. male with PMH significant for HTN, hyperlipidemia, DM, CAD s/p CABG, atrial flutter on coumadin, CKD, MGUS, who was brought in by family for further evaluation of difficulty walking, slurred speech, and left-sided weakness for 4 days. 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   Pertinent Vitals Pain Assessment: No/denies pain  SLP Plan  Continue with current plan of care    Recommendations                Follow up Recommendations: Inpatient Rehab;24 hour supervision/assistance Plan: Continue with current plan of care    GO       [name], M.A. CCC-SLP [phone]  [name] 06/14/2014, 2:12 PM

## 2014-06-14 NOTE — Progress Notes (Signed)
Report given to Angie in 4W about Mr. Salguero.

## 2014-06-14 NOTE — Progress Notes (Signed)
Rehab admissions - We did receive insurance approval for inpatient rehab from [place] and I received medical clearance from Dr. Erlinda Hong. I completed admission paperwork with pt and his daughter was present. I also called and left voicemail with pt's son to give update.  I updated Hassan Rowan, case Freight forwarder, Dysheka with social work and Therapist, sports. Pt will be admitted to inpatient rehab later today.  Please call me with any questions. Thanks.  Nanetta Batty, PT Rehabilitation Admissions Coordinator [phone]

## 2014-06-14 NOTE — Progress Notes (Signed)
Patient tx to 48w23. Assessments remains unchanged as at now.

## 2014-06-15 ENCOUNTER — Inpatient Hospital Stay (HOSPITAL_COMMUNITY): Payer: Medicare HMO | Admitting: Occupational Therapy

## 2014-06-15 ENCOUNTER — Inpatient Hospital Stay (HOSPITAL_COMMUNITY): Payer: Medicare HMO | Admitting: Physical Therapy

## 2014-06-15 ENCOUNTER — Inpatient Hospital Stay (HOSPITAL_COMMUNITY): Payer: Medicare Other | Admitting: Speech Pathology

## 2014-06-15 DIAGNOSIS — I4891 Unspecified atrial fibrillation: Secondary | ICD-10-CM

## 2014-06-15 DIAGNOSIS — I619 Nontraumatic intracerebral hemorrhage, unspecified: Secondary | ICD-10-CM

## 2014-06-15 DIAGNOSIS — Z5189 Encounter for other specified aftercare: Secondary | ICD-10-CM

## 2014-06-15 DIAGNOSIS — I5032 Chronic diastolic (congestive) heart failure: Secondary | ICD-10-CM

## 2014-06-15 LAB — GLUCOSE, CAPILLARY
GLUCOSE-CAPILLARY: 118 mg/dL — AB (ref 70–99)
Glucose-Capillary: 165 mg/dL — ABNORMAL HIGH (ref 70–99)
Glucose-Capillary: 167 mg/dL — ABNORMAL HIGH (ref 70–99)
Glucose-Capillary: 94 mg/dL (ref 70–99)

## 2014-06-15 NOTE — Evaluation (Signed)
Physical Therapy Assessment and Plan  Patient Details  Name: [name] MRN: [id number] Date of Birth: 23-Jul-1931  PT Diagnosis: Abnormal posture, Abnormality of gait, Ataxia, Cognitive deficits, Coordination disorder, Difficulty walking, Hemiplegia non-dominant, Hypotonia, Impaired cognition and Muscle weakness Rehab Potential: Good ELOS: 3-4 weeks   Today's Date: 06/15/2014 PT Individual Time: 1100-1200  60 min Problem List:  Patient Active Problem List   Diagnosis Date Noted  . ICH (intracerebral hemorrhage) 06/14/2014  . Stroke due to intracerebral hemorrhage 06/11/2014  . Atrial fibrillation 11/20/2013  . Chronic diastolic congestive heart failure 11/20/2013  . Atrial flutter 06/25/2013  . Long term (current) use of anticoagulants 06/25/2013  . CKD (chronic kidney disease) stage 5, GFR less than 15 ml/min 08/29/2012  . Pulmonary arterial hypertension 07/29/2012  . Anemia, iron deficiency 01/25/2012  . AV block, Mobitz 1 01/10/2012  . CARDIOMYOPATHY, ISCHEMIC 10/22/2010  . HEADACHE 01/23/2009  . DIABETIC PERIPHERAL NEUROPATHY 07/24/2008  . ABDOMINAL PAIN, RIGHT UPPER QUADRANT 10/23/2007  . DIABETES MELLITUS 06/24/2007  . HYPERLIPIDEMIA 06/24/2007  . CORONARY ARTERY DISEASE 06/24/2007  . OSTEOARTHRITIS 06/24/2007  . CEREBROVASCULAR DISEASE, HX OF 06/24/2007  . STATUS, OTHER TOE(S) AMPUTATION 06/24/2007  . HYPERTENSION 05/13/2007  . CORONARY ARTERY BYPASS GRAFT, HX OF 11/25/1994    Past Medical History:  Past Medical History  Diagnosis Date  . CAD (coronary artery disease)     s/p 3v CABG 2006, myoview 04/2010 EF 49%, prior inferior/apical infarct, no ischemia, LHC 11/2011 stable anatomy (occluded LAD filled from vein graft, distal LCx occluded prior to OM2, OM2 filled from vein graft, RCA prox 25%, mid 40%, distal 25% lesions, SVG to diagonal occluded) Med Rx  . Chronic diastolic heart failure     hx of cardiorenal syndrome;  Echo 06/2012 EF 01-75%, mod diastolic  dysfunction, mild MR, mod TR, mild R/LAE, mild RV dilatation, PASP 11mHg.  . Carotid stenosis     a. Carotid dopplers R 40-59%, Left 60-79%;   b. carotids 12/13:  01-02%RICA, 658-52%LICA (rpt in 6 mos);  c.  Carotid [place](11/14):  R 40-59%; L 60-79%, R vertebral occluded, L vertebral antegrade  . HLD (hyperlipidemia)   . Mediastinal adenopathy     per CT chest in 2006 with PET scan showing very limited metabolic activity; not felt to have a significant neoplastic potential  . Iron deficiency anemia   . Hypertension   . Diabetes mellitus     insulin dependent   . GERD (gastroesophageal reflux disease)   . AV block, Mobitz 1     Noted 11/2011 in hospital, BB stopped  . Colon polyps   . Diverticulosis   . Pulmonary arterial hypertension     a.  RHC 11/1 PASP 60 mmHg (mean 38); b. RHC 11/15 PA pressure 66/24, PCWP 26 and CO 6.2 => Sildenafil started;   c. Echo bubble study 11/13:  no obvious shunt;   d. VQ scan neg for pulmonary embolism  . CKD (chronic kidney disease) stage 5, GFR less than 15 ml/min     hx of cardiorenal syndrome;  Dr PPosey Pronto  AVF recommended and scheduled (patient hesitant to proceed);   Renal [place]11/13: diff echogenic kidneys c/w medical renal disease; no hydronephrosis or renal mass  . Hyperparathyroidism, secondary renal   . MGUS (monoclonal gammopathy of unknown significance)     a. Kappa/Lambda free light chain ratio 11/13:  23.99;  b. Met. Bone survey 11/13:  no osteolytic lesions to suggest MMyeloma  . Atrial flutter  diagnosed 05/2013; Coumadin initiated   Past Surgical History:  Past Surgical History  Procedure Laterality Date  . Appendectomy    . Coronary artery bypass graft  2006    SVG to OM2, SVG to LAD, SVG to DX; (the LIMA did not have good flow and therefore was not used)  . Cardiac catheterization  12/21/2011    Assessment & Plan Clinical Impression:  [name] is a 80 y.o. right-handed male with history diabetes mellitus with peripheral  neuropathy, CAD status post CABG, atrial flutter on chronic Coumadin, chronic renal insufficiency with baseline creatinine 5.40, chronic diastolic congestive heart failure. Independent with occasional cane and driving living with family. Admitted 06/11/2049 of left-sided weakness and slurred speech as well as recent fall but denies striking his head. Cranial CT scan shows a right pontomedullary 14 x 22 mm oval focus consistent with acute hemorrhage. INR on admission of 9.81 and systolic blood pressure 191. Placed on intravenous Lopressor and Coumadin was reversed. Followup cranial CT scan unchanged. 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 Tolerating a regular consistency diet. Coumadin remains on hold with cardiac rate control. Subcutaneous heparin was initiated for DVT prophylaxis 06/13/2014  Patient transferred to CIR on 06/14/2014 .   Patient currently requires max with mobility secondary to muscle weakness, decreased cardiorespiratoy endurance, impaired timing and sequencing, abnormal tone, ataxia, decreased coordination and decreased motor planning, decreased midline orientation and decreased attention to left, decreased awareness, decreased problem solving, decreased safety awareness and decreased memory and decreased sitting balance, decreased standing balance, decreased postural control, hemiplegia and decreased balance strategies.  Prior to hospitalization, patient was modified independent  with mobility and lived with Son (son lives with pt) in a House home.  Home access is  Level entry.  Patient will benefit from skilled PT intervention to maximize safe functional mobility, minimize fall risk and decrease caregiver burden for planned discharge home with intermittent assist.  Anticipate patient will benefit from follow up [facility] at discharge.     Skilled Therapeutic Intervention PT Evaluation: PT completes MMT, tone assessment, functional mobility assessment, balance  tests, sensation & proprioception assessment, and w/c management testing. Pt presents with L side weakness and hypotonia, strong but ataxic R UE and LE, difficulty with all functional mobility, including possible emergence of pushing with R side (vs pulling with R side on stable surfaces for balance), poor postural control, and inability to ambulate with 1 person.   W/C Management: PT instructs pt in w/c propulsion with R UE/LE x 40' req max A initially, progressing to mod A with practice.   Therapeutic Activity: PT instructs pt in rolling L in bed without rail req mod A, rolling R in bed without rail req max A, R side lie to sit req max A, stand pivot transfer bed to w/c to L with R bedrail req max A and PT facilitation to remove hand from bedrail due to pt not following commands to do so.   Pt will benefit from IP PT to maximize functional independence.    PT Evaluation Precautions/Restrictions Precautions Precautions: Fall Restrictions Weight Bearing Restrictions: No General Chart Reviewed: Yes Family/Caregiver Present: No Vital Signs  Pain Pain Assessment Pain Assessment: No/denies pain Home Living/Prior Functioning Home Living Available Help at Discharge: Family Type of Home: House Home Access: Level entry Home Layout: Two level;Able to live on main level with bedroom/bathroom Alternate Level Stairs-Number of Steps: 1 flight Alternate Level Stairs-Rails: Left (wall on the right)  Lives With: Son (son lives  with pt) Prior Function Level of Independence: Requires assistive device for independence (SPC)  Able to Take Stairs?: Yes Driving: Yes Vocation: Retired Comments: hang out with friends Vision/Perception  Vision - Assessment Eye Alignment: Impaired (comment) Ocular Range of Motion: Within Functional Limits Alignment/Gaze Preference: Gaze right Tracking/Visual Pursuits: Able to track stimulus in all quads without difficulty Saccades: Additional head turns occurred  during testing;Undershoots  Cognition Arousal/Alertness: Awake/alert Orientation Level: Oriented X4 Attention: Focused;Sustained Focused Attention: Appears intact Sustained Attention: Appears intact Memory: Appears intact Sensation Sensation Light Touch: Appears Intact Stereognosis: Not tested Hot/Cold: Not tested Proprioception: Appears Intact (Bwrists and ankles tested) Coordination Gross Motor Movements are Fluid and Coordinated: No Fine Motor Movements are Fluid and Coordinated: Not tested Coordination and Movement Description: ataxic R side movements,  Finger Nose Finger Test: ataxia on R side Heel Shin Test: ataxia on R side Motor  Motor Motor: Ataxia;Hemiplegia;Abnormal tone;Abnormal postural alignment and control Motor - Skilled Clinical Observations: slouched sitting posture without back support  Mobility Bed Mobility Bed Mobility: Rolling Right;Rolling Left;Right Sidelying to Sit;Sitting - Scoot to [employer] of Bed Rolling Right: 2: Max assist Rolling Right Details: Manual facilitation for placement;Verbal cues for technique Rolling Right Details (indicate cue type and reason): hand and leg use Rolling Left: 3: Mod assist Rolling Left Details: Manual facilitation for placement;Verbal cues for technique Rolling Left Details (indicate cue type and reason): hand and leg use Right Sidelying to Sit: 2: Max assist;HOB flat Right Sidelying to Sit Details: Manual facilitation for placement;Verbal cues for technique Right Sidelying to Sit Details (indicate cue type and reason): hand placement Sitting - Scoot to Edge of Bed: 1: +1 Total assist Sitting - Scoot to Edge of Bed Details: Manual facilitation for placement;Verbal cues for technique Sitting - Scoot to Edge of Bed Details (indicate cue type and reason): trunk leaning Transfers Transfers: Yes Sit to Stand: From bed;With armrests;With upper extremity assist;2: Max assist Sit to Stand Details: Manual facilitation for  placement;Verbal cues for technique Sit to Stand Details (indicate cue type and reason): poor forward weight shift Stand Pivot Transfers: 2: Max assist Stand Pivot Transfer Details: Manual facilitation for placement;Verbal cues for technique Stand Pivot Transfer Details (indicate cue type and reason): Pt will not let go of bedrail in order to sit; req PT to pry fingers off of bedrail to allow pt to sit.  Locomotion  Ambulation Ambulation: No Gait Gait: No Stairs / Additional Locomotion Stairs: No Architect: Yes Wheelchair Assistance: 2: Max Technical sales engineer Details: Manual facilitation for placement;Verbal cues for Marketing executive: Right upper extremity;Right lower extremity Wheelchair Parts Management: Needs assistance Distance: 40  Trunk/Postural Assessment  Cervical Assessment Cervical Assessment: Exceptions to [facility] Cervical AROM Overall Cervical AROM: Deficits Overall Cervical AROM Comments: Limited L rotation and sideflexion compared to R Thoracic Assessment Thoracic Assessment: Exceptions to [facility] Thoracic AROM Overall Thoracic AROM: Deficits Overall Thoracic AROM Comments: limited rotation and extension Lumbar Assessment Lumbar Assessment: Within Functional Limits Postural Control Postural Control: Deficits on evaluation Trunk Control: poor forward weight shigt Righting Reactions: limited Protective Responses: delayed Postural Limitations: leans R in sit toward strong side  Balance Balance Balance Assessed: Yes Static Sitting Balance Static Sitting - Balance Support: Right upper extremity supported;Feet supported Static Sitting - Level of Assistance: 5: Stand by assistance (after getting pt in midline) Dynamic Sitting Balance Dynamic Sitting - Balance Support: Feet unsupported;No upper extremity supported;During functional activity Dynamic Sitting - Level of Assistance: 4: Min assist Reach (Patient is able  to  reach ___ inches to right, left, forward, back): minimal excursions Dynamic Sitting - Balance Activities: Forward lean/weight shifting;Lateral lean/weight shifting Sitting balance - Comments: pt can accept minimal challenge given time to accommodate Static Standing Balance Static Standing - Balance Support: Right upper extremity supported;During functional activity Static Standing - Level of Assistance: 3: Mod assist Dynamic Standing Balance Dynamic Standing - Balance Support: Right upper extremity supported;During functional activity (crouched positioning) Dynamic Standing - Level of Assistance: 3: Mod assist Dynamic Standing - Balance Activities: Lateral lean/weight shifting;Forward lean/weight shifting Extremity Assessment  RUE Assessment RUE Assessment: Within Functional Limits LUE Assessment LUE Assessment: Exceptions to [facility] LUE Strength LUE Overall Strength: Deficits LUE Overall Strength Comments: arm flexion trace, elbow flexion 2/5, elbow extension 2-/5, grip 2-/5 LUE Tone LUE Tone: Hypotonic RLE Assessment RLE Assessment: Within Functional Limits LLE Assessment LLE Assessment: Exceptions to [facility] LLE Strength LLE Overall Strength: Deficits LLE Overall Strength Comments: hip flexion and extension 2+/5, knee extension 2+/5, knee flexion 2+/5, hip abduction 2-/5, hip adduction 2-/5, ankle DF only elicited with resisted knee flexion LLE Tone LLE Tone: Modified Ashworth Modified Ashworth Scale for Grading Hypertonia LLE: Slight increase in muscle tone, manifested by a catch and release or by minimal resistance at the end of the range of motion when the affected part(s) is moved in flexion or extension LLE Tone Comments: hamstrings  FIM:  FIM - Control and instrumentation engineer Devices: Bed rails Bed/Chair Transfer: 2: Supine > Sit: Max A (lifting assist/Pt. 25-49%);2: Bed > Chair or W/C: Max A (lift and lower assist) FIM - Locomotion: Wheelchair Distance:  40 Locomotion: Wheelchair: 1: Travels less than 50 ft with maximal assistance (Pt: 25 - 49%) FIM - Locomotion: Ambulation Locomotion: Ambulation: 0: Activity did not occur FIM - Locomotion: Stairs Locomotion: Stairs: 0: Activity did not occur   Refer to Care Plan for Long Term Goals  Recommendations for other services: None  Discharge Criteria: Patient will be discharged from PT if patient refuses treatment 3 consecutive times without medical reason, if treatment goals not met, if there is a change in medical status, if patient makes no progress towards goals or if patient is discharged from hospital.  The above assessment, treatment plan, treatment alternatives and goals were discussed and mutually agreed upon: by patient  [facility] M 06/15/2014, 11:55 AM

## 2014-06-15 NOTE — Interval H&P Note (Signed)
[name] was admitted today to Inpatient Rehabilitation with the diagnosis of right ponto-medullary hemorrhage.  The patient's history has been reviewed, patient examined, and there is no change in status.  Patient continues to be appropriate for intensive inpatient rehabilitation.  I have reviewed the patient's chart and labs.  Questions were answered to the patient's satisfaction.  This encounter and document were completed on 06/14/14. The H&P is now being placed in the rehab encounter for the purpose of charting.   [name] 06/15/2014, 5:14 AM

## 2014-06-15 NOTE — Progress Notes (Signed)
Subjective/Complaints: 80 y.o. right-handed male with history diabetes mellitus with peripheral neuropathy, CAD status post CABG, atrial flutter on chronic Coumadin, chronic renal insufficiency with baseline creatinine 2.54, chronic diastolic congestive heart failure. Independent with occasional cane and driving living with family. Admitted 06/11/2049 of left-sided weakness and slurred speech as well as recent fall but denies striking his head. Cranial CT scan shows a right pontomedullary 14 x 22 mm oval focus consistent with acute hemorrhage. INR on admission of 2.70 and systolic blood pressure 623. Placed on intravenous Lopressor and Coumadin was reversed. Followup cranial CT scan unchanged. 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 Tolerating a regular consistency diet. Coumadin remains on hold with cardiac rate control.  Review of Systems - Negative except weak on left  Objective: Vital Signs: Blood pressure 155/68, pulse 56, temperature 98 F (36.7 C), temperature source Oral, resp. rate 18, SpO2 100.00%. No results found. Results for orders placed during the hospital encounter of 06/14/14 (from the past 72 hour(s))  GLUCOSE, CAPILLARY     Status: Abnormal   Collection Time    06/14/14  5:43 PM      Result Value Ref Range   Glucose-Capillary 199 (*) 70 - 99 mg/dL  GLUCOSE, CAPILLARY     Status: Abnormal   Collection Time    06/14/14 10:34 PM      Result Value Ref Range   Glucose-Capillary 160 (*) 70 - 99 mg/dL  GLUCOSE, CAPILLARY     Status: Abnormal   Collection Time    06/15/14  6:58 AM      Result Value Ref Range   Glucose-Capillary 118 (*) 70 - 99 mg/dL     HEENT: normal Cardio: RRR and no murmur Resp: CTA B/L and unlabored GI: BS positive and NT, ND Extremity:  No Edema Skin:   Other Left transerve healed breast incision at nipple level, left upper back incion well healed, dry skin both feet without open areas Neuro: Lethargic, Flat,  Cranial Nerve Abnormalities Left central 7, Abnormal Sensory decreased in both feet, Abnormal Motor bilateral hand intrinsic atrophy and Other mpotor 2- Left bi, tri grip, 3- Left HF, KE , 2- ankle DF Musc/Skel:  Other no pain with AROM in BUE adn BLE, or with neck movement, sitting balance fair minus Gen NAD   Assessment/Plan: 1. Functional deficits secondary to RIght ponomedullary hemorrhage with Left hemiparesis superimposed on severe diabetic neuropathy which require 3+ hours per day of interdisciplinary therapy in a comprehensive inpatient rehab setting. Physiatrist is providing close team supervision and 24 hour management of active medical problems listed below. Physiatrist and rehab team continue to assess barriers to discharge/monitor patient progress toward functional and medical goals. FIM:                   Comprehension Comprehension: 7-Follows complex conversation/direction: With no assist  Expression Expression: 7-Expresses complex ideas: With no assist  Social Interaction Social Interaction: 7-Interacts appropriately with others - No medications needed.  Problem Solving Problem Solving: 7-Solves complex problems: Recognizes & self-corrects  Memory Memory: 7-Complete Independence: No helper  Medical Problem List and Plan:  1. 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 Latest cranial CT scan stable  2. DVT Prophylaxis/Anticoagulation: Subcutaneous heparin initiated for DVT prophylaxis 06/13/2014  3. Pain Management: Tylenol as needed. Monitor the increased mobility  4. Diabetes mellitus with peripheral neuropathy. Latest hemoglobin A1c 6.7. Check blood sugars a.c. and at bedtime.Tradjenta 5 mg daily. Patient on Levemir  15 units each bedtime and Onglyza 2.5 mg daily prior to admission. Plan to resume as needed  5. Neuropsych: This patient is not capable of making decisions on his own behalf.  6. Skin/Wound  Care: Routine skin checks  7. Chronic renal insufficiency. Baseline creatinine 2.84. Followup chemistries  8. Chronic diastolic congestive heart failure. Monitor for any signs of fluid overload. Patient on Lasix 80 mg Daily.  9. Hypertension. Norvasc 10 mg daily, Coreg 6.25 mg twice a day. Monitor with increased mobility  10. CAD with history of CABG. No chest pain or shortness of breath  11. Hyperlipidemia. Lipitor  12. GERD. Protonix   LOS (Days) 1 A FACE TO FACE EVALUATION WAS PERFORMED  KIRSTEINS,ANDREW E 06/15/2014, 9:26 AM

## 2014-06-15 NOTE — Evaluation (Signed)
Speech Language Pathology Assessment and Plan  Patient Details  Name: [name] MRN: [id number] Date of Birth: [birth year]  SLP Diagnosis: Dysarthria;Cognitive Impairments  Rehab Potential: Good ELOS: 10-14 days    Today's Date: 06/15/2014 SLP Individual Time: 1000-1100 SLP Individual Time Calculation (min): 60 min   Problem List:  Patient Active Problem List   Diagnosis Date Noted  . ICH (intracerebral hemorrhage) 06/14/2014  . Stroke due to intracerebral hemorrhage 06/11/2014  . Atrial fibrillation 11/20/2013  . Chronic diastolic congestive heart failure 11/20/2013  . Atrial flutter 06/25/2013  . Long term (current) use of anticoagulants 06/25/2013  . CKD (chronic kidney disease) stage 5, GFR less than 15 ml/min 08/29/2012  . Pulmonary arterial hypertension 07/29/2012  . Anemia, iron deficiency 01/25/2012  . AV block, Mobitz 1 01/10/2012  . CARDIOMYOPATHY, ISCHEMIC 10/22/2010  . HEADACHE 01/23/2009  . DIABETIC PERIPHERAL NEUROPATHY 07/24/2008  . ABDOMINAL PAIN, RIGHT UPPER QUADRANT 10/23/2007  . DIABETES MELLITUS 06/24/2007  . HYPERLIPIDEMIA 06/24/2007  . CORONARY ARTERY DISEASE 06/24/2007  . OSTEOARTHRITIS 06/24/2007  . CEREBROVASCULAR DISEASE, HX OF 06/24/2007  . STATUS, OTHER TOE(S) AMPUTATION 06/24/2007  . HYPERTENSION 05/13/2007  . CORONARY ARTERY BYPASS GRAFT, HX OF 11/25/1994   Past Medical History:  Past Medical History  Diagnosis Date  . CAD (coronary artery disease)     s/p 3v CABG 2006, myoview 04/2010 EF 49%, prior inferior/apical infarct, no ischemia, LHC 11/2011 stable anatomy (occluded LAD filled from vein graft, distal LCx occluded prior to OM2, OM2 filled from vein graft, RCA prox 25%, mid 40%, distal 25% lesions, SVG to diagonal occluded) Med Rx  . Chronic diastolic heart failure     hx of cardiorenal syndrome;  Echo 06/2012 EF 85-88%, mod diastolic dysfunction, mild MR, mod TR, mild R/LAE, mild RV dilatation, PASP 90mHg.  . Carotid stenosis     a. Carotid dopplers R 40-59%, Left 60-79%;   b. carotids 12/13:  05-02%RICA, 677-41%LICA (rpt in 6 mos);  c.  Carotid [place](11/14):  R 40-59%; L 60-79%, R vertebral occluded, L vertebral antegrade  . HLD (hyperlipidemia)   . Mediastinal adenopathy     per CT chest in 2006 with PET scan showing very limited metabolic activity; not felt to have a significant neoplastic potential  . Iron deficiency anemia   . Hypertension   . Diabetes mellitus     insulin dependent   . GERD (gastroesophageal reflux disease)   . AV block, Mobitz 1     Noted 11/2011 in hospital, BB stopped  . Colon polyps   . Diverticulosis   . Pulmonary arterial hypertension     a.  RHC 11/1 PASP 60 mmHg (mean 38); b. RHC 11/15 PA pressure 66/24, PCWP 26 and CO 6.2 => Sildenafil started;   c. Echo bubble study 11/13:  no obvious shunt;   d. VQ scan neg for pulmonary embolism  . CKD (chronic kidney disease) stage 5, GFR less than 15 ml/min     hx of cardiorenal syndrome;  Dr PPosey Pronto  AVF recommended and scheduled (patient hesitant to proceed);   Renal [place]11/13: diff echogenic kidneys c/w medical renal disease; no hydronephrosis or renal mass  . Hyperparathyroidism, secondary renal   . MGUS (monoclonal gammopathy of unknown significance)     a. Kappa/Lambda free light chain ratio 11/13:  23.99;  b. Met. Bone survey 11/13:  no osteolytic lesions to suggest MMyeloma  . Atrial flutter     diagnosed 05/2013; Coumadin initiated   Past Surgical  History:  Past Surgical History  Procedure Laterality Date  . Appendectomy    . Coronary artery bypass graft  2006    SVG to OM2, SVG to LAD, SVG to DX; (the LIMA did not have good flow and therefore was not used)  . Cardiac catheterization  12/21/2011    Assessment / Plan / Recommendation Clinical Impression Patient presents with min-moderate cognitive impairment and moderate dysarthria. Cognitive impairment characterized by processing delays, decreased awareness to impact of deficits,  decreased organization and sequencing during question-response/conversation, decreased recall of new information, poor self- monitoring. Patient's dysarthria is characterized as flaccid and intelligibility of speech is significantly reduced at all levels (word, phrase, sentence) and mainly is secondary to reduced lingual articulation and suspect reduced respiratory support/coordination. Patient's deficits impact his ability to communicate his wants/needs/thoughts, his ability to perform functional problem solving/daily tasks, and safety. He will benefit from skilled speech-language pathologist intervention in order to improve.  Skilled Therapeutic Interventions            SLP Assessment  Patient will need skilled [facility] during CIR admission    Recommendations  Patient destination: Home Follow up Recommendations: Home Health SLP Equipment Recommended: None recommended by SLP    SLP Frequency 5 out of 7 days   SLP Treatment/Interventions Cognitive remediation/compensation;Speech/Language facilitation;Cueing hierarchy;Functional tasks;Internal/external aids;Patient/family education;Therapeutic Exercise    Pain Pain Assessment Pain Assessment: No/denies pain Pain Score: 0-No pain Prior Functioning Cognitive/Linguistic Baseline: Information not available Type of Home: House  Lives With: Son Available Help at Discharge: Family Vocation:  (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 SLP Short Term Goal 2 (Week 1): Patient will demonstrate anticipatory awareness by describing impact of speech and physical deficits, with moderate cues. SLP Short Term Goal 3 (Week 1): Patient will recall and demonstrate understanding of safety precautions and  strategies learned in PT, OT, SLP treatment sessions, with minimal cues. SLP Short Term Goal 4 (Week 1): Patient will solve basic level functional problems wtih 80% accuracy.  See FIM for current functional status Refer to Care Plan for Long Term Goals  Recommendations for other services: None  Discharge Criteria: Patient will be discharged from SLP if patient refuses treatment 3 consecutive times without medical reason, if treatment goals not met, if there is a change in medical status, if patient makes no progress towards goals or if patient is discharged from hospital.  The above assessment, treatment plan, treatment alternatives and goals were discussed and mutually agreed upon: by patient  Dannial Monarch 06/15/2014, 5:02 PM  Sonia Baller, MA, CCC-SLP [facility] Speech-Language Pathologist

## 2014-06-15 NOTE — Progress Notes (Signed)
Occupational Therapy Assessment and Plan  Patient Details  Name: [name] MRN: [id number] Date of Birth: November 02, 1930  OT Diagnosis: abnormal posture, apraxia, cognitive deficits, hemiplegia affecting non-dominant side and muscle weakness (generalized) Rehab Potential: Rehab Potential: Good ELOS: 3-4 weeks   Today's Date: 06/15/2014 OT Individual Time:  0857- 0957     60 minutes  Problem List:  Patient Active Problem List   Diagnosis Date Noted  . ICH (intracerebral hemorrhage) 06/14/2014  . Stroke due to intracerebral hemorrhage 06/11/2014  . Atrial fibrillation 11/20/2013  . Chronic diastolic congestive heart failure 11/20/2013  . Atrial flutter 06/25/2013  . Long term (current) use of anticoagulants 06/25/2013  . CKD (chronic kidney disease) stage 5, GFR less than 15 ml/min 08/29/2012  . Pulmonary arterial hypertension 07/29/2012  . Anemia, iron deficiency 01/25/2012  . AV block, Mobitz 1 01/10/2012  . CARDIOMYOPATHY, ISCHEMIC 10/22/2010  . HEADACHE 01/23/2009  . DIABETIC PERIPHERAL NEUROPATHY 07/24/2008  . ABDOMINAL PAIN, RIGHT UPPER QUADRANT 10/23/2007  . DIABETES MELLITUS 06/24/2007  . HYPERLIPIDEMIA 06/24/2007  . CORONARY ARTERY DISEASE 06/24/2007  . OSTEOARTHRITIS 06/24/2007  . CEREBROVASCULAR DISEASE, HX OF 06/24/2007  . STATUS, OTHER TOE(S) AMPUTATION 06/24/2007  . HYPERTENSION 05/13/2007  . CORONARY ARTERY BYPASS GRAFT, HX OF 11/25/1994    Past Medical History:  Past Medical History  Diagnosis Date  . CAD (coronary artery disease)     s/p 3v CABG 2006, myoview 04/2010 EF 49%, prior inferior/apical infarct, no ischemia, LHC 11/2011 stable anatomy (occluded LAD filled from vein graft, distal LCx occluded prior to OM2, OM2 filled from vein graft, RCA prox 25%, mid 40%, distal 25% lesions, SVG to diagonal occluded) Med Rx  . Chronic diastolic heart failure     hx of cardiorenal syndrome;  Echo 06/2012 EF 24-58%, mod diastolic dysfunction, mild MR, mod TR, mild  R/LAE, mild RV dilatation, PASP 36mmHg.  . Carotid stenosis     a. Carotid dopplers R 40-59%, Left 60-79%;   b. carotids 12/13:  0-99% RICA, 83-38% LICA (rpt in 6 mos);  c.  Carotid US (11/14):  R 40-59%; L 60-79%, R vertebral occluded, L vertebral antegrade  . HLD (hyperlipidemia)   . Mediastinal adenopathy     per CT chest in 2006 with PET scan showing very limited metabolic activity; not felt to have a significant neoplastic potential  . Iron deficiency anemia   . Hypertension   . Diabetes mellitus     insulin dependent   . GERD (gastroesophageal reflux disease)   . AV block, Mobitz 1     Noted 11/2011 in hospital, BB stopped  . Colon polyps   . Diverticulosis   . Pulmonary arterial hypertension     a.  RHC 11/1 PASP 60 mmHg (mean 38); b. RHC 11/15 PA pressure 66/24, PCWP 26 and CO 6.2 => Sildenafil started;   c. Echo bubble study 11/13:  no obvious shunt;   d. VQ scan neg for pulmonary embolism  . CKD (chronic kidney disease) stage 5, GFR less than 15 ml/min     hx of cardiorenal syndrome;  Dr Posey Pronto;  AVF recommended and scheduled (patient hesitant to proceed);   Renal US 11/13: diff echogenic kidneys c/w medical renal disease; no hydronephrosis or renal mass  . Hyperparathyroidism, secondary renal   . MGUS (monoclonal gammopathy of unknown significance)     a. Kappa/Lambda free light chain ratio 11/13:  23.99;  b. Met. Bone survey 11/13:  no osteolytic lesions to suggest MMyeloma  . Atrial flutter  diagnosed 05/2013; Coumadin initiated   Past Surgical History:  Past Surgical History  Procedure Laterality Date  . Appendectomy    . Coronary artery bypass graft  2006    SVG to OM2, SVG to LAD, SVG to DX; (the LIMA did not have good flow and therefore was not used)  . Cardiac catheterization  12/21/2011    Assessment & Plan Clinical Impression: Patient is a 80 y.o. year old male right-handed male with history diabetes mellitus with peripheral neuropathy, CAD status post CABG,  atrial flutter on chronic Coumadin, chronic renal insufficiency with baseline creatinine 2.40, chronic diastolic congestive heart failure. Independent with occasional cane and driving living with family. Admitted 06/11/2049 of left-sided weakness and slurred speech as well as recent fall but denies striking his head. Cranial CT scan shows a right pontomedullary 14 x 22 mm oval focus consistent with acute hemorrhage. INR on admission of 9.73 and systolic blood pressure 532. Placed on intravenous Lopressor and Coumadin was reversed. Followup cranial CT scan unchanged. 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 Tolerating a regular consistency diet. Coumadin remains on hold with cardiac rate control. Subcutaneous heparin was initiated for DVT prophylaxis 06/13/2014. Patient transferred to CIR on 06/14/2014 .    Patient currently requires max with basic self-care skills secondary to muscle weakness, decreased cardiorespiratoy endurance, abnormal tone, motor apraxia, ataxia and decreased coordination, decreased attention, decreased awareness, decreased problem solving and decreased safety awareness and decreased sitting balance, decreased standing balance, decreased postural control, hemiplegia and decreased balance strategies.  Prior to hospitalization, patient could complete ADLs with modified independent .  Patient will benefit from skilled intervention to decrease level of assist with basic self-care skills prior to discharge home with care partner.  Anticipate patient will require 24 hour supervision and minimal physical assistance and HHOT vs outpatient OT services.  OT - End of Session Endurance Deficit: Yes Endurance Deficit Description: cardiorespiratory OT Assessment Rehab Potential: Good Barriers to Discharge:  (none noted at this time) OT Patient demonstrates impairments in the following area(s): Balance;Cognition;Endurance;Motor;Safety;Vision OT Basic ADL's Functional  Problem(s): Grooming;Bathing;Dressing;Toileting OT Transfers Functional Problem(s): Toilet;Tub/Shower OT Additional Impairment(s): Fuctional Use of Upper Extremity OT Plan OT Intensity: Minimum of 1-2 x/day, 45 to 90 minutes OT Frequency: 5 out of 7 days OT Duration/Estimated Length of Stay: 3-4 weeks OT Treatment/Interventions: Balance/vestibular training;Community reintegration;Neuromuscular re-education;Patient/family education;Self Care/advanced ADL retraining;Therapeutic Exercise;UE/LE Coordination activities;Wheelchair propulsion/positioning;Visual/perceptual remediation/compensation;UE/LE Strength taining/ROM;Therapeutic Activities;Functional mobility training;DME/adaptive equipment instruction;Discharge planning;Cognitive remediation/compensation OT Basic Self-Care Anticipated Outcome(s): min A OT Toileting Anticipated Outcome(s): min A OT Bathroom Transfers Anticipated Outcome(s): min A OT Recommendation Recommendations for Other Services:  (none) Patient destination: Home Follow Up Recommendations: 24 hour supervision/assistance;Home health OT;Outpatient OT Equipment Recommended: 3 in 1 bedside comode;Tub/shower seat   Skilled Therapeutic Intervention Pt supine in bed upon entering the room. Pt with no c/o pain this session. OT evaluation initiated and completed. Pt educated on OT purpose, POC, and goals with pt in agreement. Bathing and dressing preformed with pt seated on EOB. Pt with posterior pelvic tilt and required Min - Mod A for balance during bathing and dressing with mod verbal cues and assist to lean forward for balance. Unable to stand pt for LB bathing and dressing secondary to balance and pt pushing backwards upon attempt to stand. LB bathing and dressing performed supine this session. Pt does not have clothing available this session. Please see full evaluation for details.  OT Evaluation Precautions/Restrictions  Precautions Precautions: Fall Restrictions Weight  Bearing Restrictions: No General  Chart Reviewed: Yes Vital Signs Therapy Vitals Temp: 98.5 F (36.9 C) Temp src: Oral Pulse Rate: 52 Resp: 18 BP: 131/62 mmHg Patient Position (if appropriate): Sitting Oxygen Therapy SpO2: 100 % O2 Device: None (Room air) Pain Pain Assessment Pain Assessment: No/denies pain Pain Score: 0-No pain Home Living/Prior Functioning Home Living Available Help at Discharge: Family Type of Home: House Home Access: Level entry Home Layout: Two level;Able to live on main level with bedroom/bathroom Alternate Level Stairs-Number of Steps: 1 flight Alternate Level Stairs-Rails: Left  Lives With: Son Prior Function Level of Independence: Requires assistive device for independence;Independent with basic ADLs  Able to Take Stairs?: Yes Driving: Yes 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) Leisure: Hobbies-yes (Comment) Comments: time with family and friends Vision/Perception  Vision- History Baseline Vision/History: Wears glasses Wears Glasses: At all times Patient Visual Report: No change from baseline Vision- Assessment Vision Assessment?: Yes;Vision impaired- to be further tested in functional context Eye Alignment: Within Functional Limits Ocular Range of Motion: Within Functional Limits Alignment/Gaze Preference: Gaze right Tracking/Visual Pursuits: Able to track stimulus in all quads without difficulty Saccades: Additional head turns occurred during testing;Undershoots Visual Fields: No apparent deficits  Cognition Overall Cognitive Status: Impaired/Different from baseline Arousal/Alertness: Awake/alert Orientation Level: Oriented X4 Attention: Sustained;Selective Focused Attention: Appears intact Sustained Attention: Appears intact Selective Attention: Impaired Selective Attention Impairment: Verbal complex Memory: Impaired Memory Impairment: Decreased short term memory;Decreased recall of new  information Decreased Short Term Memory: Verbal basic;Verbal complex Awareness: Impaired Awareness Impairment: Anticipatory impairment Problem Solving: Impaired Problem Solving Impairment: Verbal complex;Functional basic Executive Function: Self Monitoring;Organizing;Sequencing Sequencing: Impaired Sequencing Impairment: Verbal complex Organizing: Impaired Organizing Impairment: Verbal basic;Verbal complex Decision Making: Impaired Decision Making Impairment: Functional basic Self Monitoring: Impaired Self Monitoring Impairment: Verbal basic;Verbal complex Self Correcting: Impaired Self Correcting Impairment: Verbal basic;Verbal complex;Functional basic Safety/Judgment: Impaired Sensation Sensation Light Touch: Appears Intact Stereognosis: Not tested Hot/Cold: Appears Intact Proprioception: Appears Intact Coordination Gross Motor Movements are Fluid and Coordinated: No Fine Motor Movements are Fluid and Coordinated: No Motor  Motor Motor: Ataxia;Hemiplegia;Abnormal tone;Abnormal postural alignment and control Motor - Skilled Clinical Observations: forward head and posterior tilt with sitting Trunk/Postural Assessment  Cervical Assessment Cervical Assessment: Exceptions to [facility] (forward head) Thoracic Assessment Thoracic Assessment: Exceptions to [facility] Lumbar Assessment Lumbar Assessment: Within Functional Limits Postural Control Postural Control: Deficits on evaluation Trunk Control: posterior pelvic tilt, poor wieght shifitng forwards for sitting balance  Balance Dynamic Sitting Balance Dynamic Sitting - Balance Support: Feet unsupported;No upper extremity supported;During functional activity Dynamic Sitting - Level of Assistance: 4: Min assist Extremity/Trunk Assessment RUE Assessment RUE Assessment: Within Functional Limits LUE Assessment LUE Assessment: Exceptions to [facility] LUE Strength LUE Overall Strength: Deficits LUE Overall Strength Comments: L UE shoulder  flexion 1/5. elbow,wrist, and digits 2-/5. LUE Tone LUE Tone: Hypotonic  FIM:  FIM - Eating Eating Activity: 5: Set-up assist for open containers FIM - Bed/Chair Transfer Bed/Chair Transfer Assistive Devices: Bed rails Bed/Chair Transfer: 2: Supine > Sit: Max A (lifting assist/Pt. 25-49%);2: Bed > Chair or W/C: Max A (lift and lower assist)   Refer to Care Plan for Long Term Goals  Recommendations for other services: None  Discharge Criteria: Patient will be discharged from OT if patient refuses treatment 3 consecutive times without medical reason, if treatment goals not met, if there is a change in medical status, if patient makes no progress towards goals or if patient is discharged from hospital.  The above assessment, treatment  plan, treatment alternatives and goals were discussed and mutually agreed upon: by patient  Phineas Semen 06/15/2014, 5:21 PM

## 2014-06-15 NOTE — H&P (View-Only) (Signed)
Physical Medicine and Rehabilitation Admission H&P    Chief Complaint  Patient presents with  . stroke like symptoms   : Chief complaint: Weakness  HPI: [name] is a 80 y.o. right-handed male with history  diabetes mellitus with peripheral neuropathy,  CAD status post CABG, atrial flutter on chronic Coumadin, chronic renal insufficiency with baseline creatinine 6.96, chronic diastolic congestive heart failure. Independent with occasional cane and driving living with family. Admitted 06/11/2049 of left-sided weakness and slurred speech as well as recent fall but denies striking his head. Cranial CT scan shows a right pontomedullary 14 x 22 mm oval focus consistent with acute hemorrhage. INR on admission of 7.89 and systolic blood pressure 381. Placed on intravenous Lopressor and Coumadin was reversed. Followup cranial CT scan unchanged. 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 Tolerating a regular consistency diet. Coumadin remains on hold with cardiac rate control. Subcutaneous heparin was initiated for DVT prophylaxis 06/13/2014 Physical therapy evaluation completed 06/12/2014 with recommendations of physical medicine and rehabilitation consult. Patient was admitted for comprehensive rehabilitation program   ROS Review of Systems  Cardiovascular: Positive for palpitations.  Gastrointestinal:  GERD  Musculoskeletal: Positive for myalgias.  Neurological: Positive for speech change and weakness  Past Medical History  Diagnosis Date  . CAD (coronary artery disease)     s/p 3v CABG 2006, myoview 04/2010 EF 49%, prior inferior/apical infarct, no ischemia, LHC 11/2011 stable anatomy (occluded LAD filled from vein graft, distal LCx occluded prior to OM2, OM2 filled from vein graft, RCA prox 25%, mid 40%, distal 25% lesions, SVG to diagonal occluded) Med Rx  . Chronic diastolic heart failure     hx of cardiorenal syndrome;  Echo 06/2012 EF 01-75%, mod  diastolic dysfunction, mild MR, mod TR, mild R/LAE, mild RV dilatation, PASP 72mHg.  . Carotid stenosis     a. Carotid dopplers R 40-59%, Left 60-79%;   b. carotids 12/13:  01-02%RICA, 658-52%LICA (rpt in 6 mos);  c.  Carotid [place](11/14):  R 40-59%; L 60-79%, R vertebral occluded, L vertebral antegrade  . HLD (hyperlipidemia)   . Mediastinal adenopathy     per CT chest in 2006 with PET scan showing very limited metabolic activity; not felt to have a significant neoplastic potential  . Iron deficiency anemia   . Hypertension   . Diabetes mellitus     insulin dependent   . GERD (gastroesophageal reflux disease)   . AV block, Mobitz 1     Noted 11/2011 in hospital, BB stopped  . Colon polyps   . Diverticulosis   . Pulmonary arterial hypertension     a.  RHC 11/1 PASP 60 mmHg (mean 38); b. RHC 11/15 PA pressure 66/24, PCWP 26 and CO 6.2 => Sildenafil started;   c. Echo bubble study 11/13:  no obvious shunt;   d. VQ scan neg for pulmonary embolism  . CKD (chronic kidney disease) stage 5, GFR less than 15 ml/min     hx of cardiorenal syndrome;  Dr PPosey Pronto  AVF recommended and scheduled (patient hesitant to proceed);   Renal [place]11/13: diff echogenic kidneys c/w medical renal disease; no hydronephrosis or renal mass  . Hyperparathyroidism, secondary renal   . MGUS (monoclonal gammopathy of unknown significance)     a. Kappa/Lambda free light chain ratio 11/13:  23.99;  b. Met. Bone survey 11/13:  no osteolytic lesions to suggest MMyeloma  . Atrial flutter     diagnosed 05/2013; Coumadin initiated  Past Surgical History  Procedure Laterality Date  . Appendectomy    . Coronary artery bypass graft  2006    SVG to OM2, SVG to LAD, SVG to DX; (the LIMA did not have good flow and therefore was not used)  . Cardiac catheterization  12/21/2011   Family History  Problem Relation Age of Onset  . Stomach cancer Sister   . Pancreatic cancer Brother   . Lung cancer Brother   . Colon cancer Neg Hx     Social History:  reports that he quit smoking about 60 years ago. He has never used smokeless tobacco. He reports that he does not drink alcohol or use illicit drugs. Allergies: No Known Allergies Medications Prior to Admission  Medication Sig Dispense Refill  . ADVOCATE LANCETS MISC       . Alcohol Swabs (ALCOHOL PREP) 70 % PADS       . amLODipine (NORVASC) 10 MG tablet Take 10 mg by mouth daily.      Marland Kitchen atorvastatin (LIPITOR) 20 MG tablet take 1 tablet by mouth at bedtime  30 tablet  3  . Blood Glucose Calibration (OT ULTRA/FASTTK CNTRL SOLN) SOLN       . carvedilol (COREG) 6.25 MG tablet Take 1 tablet (6.25 mg total) by mouth 2 (two) times daily.  180 tablet  3  . furosemide (LASIX) 80 MG tablet Take 80 mg by mouth daily.      . insulin detemir (LEVEMIR) 100 UNIT/ML injection Inject 0.15 mLs (15 Units total) into the skin at bedtime.  10 mL  2  . Insulin Pen Needle (NOVOFINE) 30G X 8 MM MISC Use as directed once daily with insulin DUE FOR LABS  30 each  0  . Lancet Devices (ADVOCATE RAPID-SAFE LANCING) MISC       . ONE TOUCH ULTRA TEST test strip       . potassium chloride SA (K-DUR,KLOR-CON) 20 MEQ tablet Take 20 mEq by mouth 2 (two) times daily.      . saxagliptin HCl (ONGLYZA) 2.5 MG TABS tablet Take 1 tablet (2.5 mg total) by mouth daily.  30 tablet  11  . Vitamin D, Ergocalciferol, (DRISDOL) 50000 UNITS CAPS capsule Take 50,000 Units by mouth every 7 (seven) days.      Marland Kitchen warfarin (COUMADIN) 6 MG tablet Take 1 tablet (6 mg total) by mouth as directed.  40 tablet  3    Home: Home Living Family/patient expects to be discharged to:: Private residence Living Arrangements: Children Available Help at Discharge: Family Type of Home: House Home Access: Level entry Home Layout: Two level;Able to live on main level with bedroom/bathroom Home Equipment: None   Functional History: Prior Function Level of Independence: Independent  Functional Status:  Mobility: Bed Mobility Overal  bed mobility: Needs Assistance Bed Mobility: Supine to Sit;Sit to Supine Supine to sit: Mod assist (with verbal cues) Sit to supine: Mod assist (verbal cueing) General bed mobility comments: Pt demonstrates diffifulty with sequencing of task and requires mulitmodal cueing and physical assistance for supine to sit. Pt requires less cueing for return to supine.         ADL:    Cognition: Cognition Overall Cognitive Status: Impaired/Different from baseline Arousal/Alertness: Awake/alert Orientation Level: Oriented to person;Oriented to place;Oriented to situation Attention: Sustained Sustained Attention: Appears intact Memory: Appears intact Awareness: Impaired Awareness Impairment: Anticipatory impairment Problem Solving: Impaired Problem Solving Impairment: Functional basic Cognition Arousal/Alertness: Awake/alert Behavior During Therapy: WFL for tasks assessed/performed Overall Cognitive Status: Impaired/Different  from baseline Area of Impairment: Attention;Following commands;Safety/judgement;Awareness;Problem solving Current Attention Level: Selective Memory: Decreased short-term memory Following Commands: Follows one step commands inconsistently;Follows one step commands with increased time Safety/Judgement: Decreased awareness of safety Awareness:  (Reduced awareness of deficits and problems ) Problem Solving: Slow processing;Decreased initiation;Difficulty sequencing;Requires verbal cues;Requires tactile cues  Physical Exam: Blood pressure 151/62, pulse 60, temperature 98.6 F (37 C), temperature source Oral, resp. rate 18, height 5' 11" (1.803 m), weight 81.149 kg (178 lb 14.4 oz), SpO2 92.00%. Physical Exam HENT: oral mucosa pink/ dentition fair to poor Head: Normocephalic.  Eyes: EOM are normal.  Neck: Normal range of motion. Neck supple. No thyromegaly present.  Cardiovascular:  Cardiac rate control. Irregular, SEM  Respiratory: Effort normal and breath sounds  normal. No respiratory distress.  GI: Soft. Bowel sounds are normal. He exhibits no distension.  Neurological: He is alert.  Left upper: delt, bicep, pec tr to 1. Wrist and hand 0, left lower extremity: 1/5 HF, KE, 0/5 ankle. Right upper and lower extremity 4+ to 5/5 proximal to distal. No gross sensory deficits.   Speech is dysarthric but intelligible. He makes good eye contact with examiner. He was able to provide his age and date of birth. Follows simple commands. Some delays in processing  Skin: Skin is warm and dry.  Psychiatric: He has a flat affect.  Results for orders placed during the hospital encounter of 06/11/14 (from the past 48 hour(s))  PROTIME-INR     Status: Abnormal   Collection Time    06/11/14  2:07 PM      Result Value Ref Range   Prothrombin Time 23.6 (*) 11.6 - 15.2 seconds   INR 2.10 (*) 0.00 - 1.49  APTT     Status: None   Collection Time    06/11/14  2:07 PM      Result Value Ref Range   aPTT 36  24 - 37 seconds  CBC     Status: Abnormal   Collection Time    06/11/14  2:07 PM      Result Value Ref Range   WBC 4.2  4.0 - 10.5 K/uL   RBC 4.16 (*) 4.22 - 5.81 MIL/uL   Hemoglobin 11.8 (*) 13.0 - 17.0 g/dL   HCT 35.9 (*) 39.0 - 52.0 %   MCV 86.3  78.0 - 100.0 fL   MCH 28.4  26.0 - 34.0 pg   MCHC 32.9  30.0 - 36.0 g/dL   RDW 17.3 (*) 11.5 - 15.5 %   Platelets 112 (*) 150 - 400 K/uL   Comment: PLATELET COUNT CONFIRMED BY SMEAR  DIFFERENTIAL     Status: None   Collection Time    06/11/14  2:07 PM      Result Value Ref Range   Neutrophils Relative % 68  43 - 77 %   Neutro Abs 2.9  1.7 - 7.7 K/uL   Lymphocytes Relative 26  12 - 46 %   Lymphs Abs 1.1  0.7 - 4.0 K/uL   Monocytes Relative 6  3 - 12 %   Monocytes Absolute 0.2  0.1 - 1.0 K/uL   Eosinophils Relative 1  0 - 5 %   Eosinophils Absolute 0.0  0.0 - 0.7 K/uL   Basophils Relative 0  0 - 1 %   Basophils Absolute 0.0  0.0 - 0.1 K/uL  COMPREHENSIVE METABOLIC PANEL     Status: Abnormal   Collection  Time    06/11/14  2:07 PM  Result Value Ref Range   Sodium 142  137 - 147 mEq/L   Potassium 4.0  3.7 - 5.3 mEq/L   Chloride 106  96 - 112 mEq/L   CO2 21  19 - 32 mEq/L   Glucose, Bld 147 (*) 70 - 99 mg/dL   BUN 35 (*) 6 - 23 mg/dL   Creatinine, Ser 2.47 (*) 0.50 - 1.35 mg/dL   Calcium 9.9  8.4 - 10.5 mg/dL   Total Protein 8.2  6.0 - 8.3 g/dL   Albumin 3.9  3.5 - 5.2 g/dL   AST 20  0 - 37 U/L   ALT 11  0 - 53 U/L   Alkaline Phosphatase 103  39 - 117 U/L   Total Bilirubin 0.7  0.3 - 1.2 mg/dL   GFR calc non Af Amer 23 (*) >90 mL/min   GFR calc Af Amer 27 (*) >90 mL/min   Comment: (NOTE)     The eGFR has been calculated using the CKD EPI equation.     This calculation has not been validated in all clinical situations.     eGFR's persistently <90 mL/min signify possible Chronic Kidney     Disease.   Anion gap 15  5 - 15  HEMOGLOBIN A1C     Status: Abnormal   Collection Time    06/11/14  2:07 PM      Result Value Ref Range   Hemoglobin A1C 6.7 (*) <5.7 %   Comment: (NOTE)                                                                               According to the ADA Clinical Practice Recommendations for 2011, when     HbA1c is used as a screening test:      >=6.5%   Diagnostic of Diabetes Mellitus               (if abnormal result is confirmed)     5.7-6.4%   Increased risk of developing Diabetes Mellitus     References:Diagnosis and Classification of Diabetes Mellitus,Diabetes     KTGY,5638,93(TDSKA 1):S62-S69 and Standards of Medical Care in             Diabetes - 2011,Diabetes Care,2011,34 (Suppl 1):S11-S61.   Mean Plasma Glucose 146 (*) <117 mg/dL   Comment: Performed at [facility], ED     Status: None   Collection Time    06/11/14  3:05 PM      Result Value Ref Range   Troponin i, poc 0.02  0.00 - 0.08 ng/mL   Comment 3            Comment: Due to the release kinetics of cTnI,     a negative result within the first hours     of the  onset of symptoms does not rule out     myocardial infarction with certainty.     If myocardial infarction is still suspected,     repeat the test at appropriate intervals.  CBG MONITORING, ED     Status: Abnormal   Collection Time    06/11/14  3:41 PM      Result Value  Ref Range   Glucose-Capillary 168 (*) 70 - 99 mg/dL   Comment 1 Documented in Chart     Comment 2 Notify RN    PREPARE FRESH FROZEN PLASMA     Status: None   Collection Time    06/11/14  7:00 PM      Result Value Ref Range   Unit Number [id number]     Blood Component Type THAWED PLASMA     Unit division 00     Status of Unit ISSUED,FINAL     Transfusion Status OK TO TRANSFUSE     Unit Number [id number]     Blood Component Type THAWED PLASMA     Unit division 00     Status of Unit ISSUED,FINAL     Transfusion Status OK TO TRANSFUSE     Unit Number [id number]     Blood Component Type THAWED PLASMA     Unit division 00     Status of Unit ISSUED,FINAL     Transfusion Status OK TO TRANSFUSE     Unit Number [id number]     Blood Component Type THAWED PLASMA     Unit division 00     Status of Unit ISSUED     Transfusion Status OK TO TRANSFUSE    MRSA PCR SCREENING     Status: None   Collection Time    06/11/14  9:31 PM      Result Value Ref Range   MRSA by PCR NEGATIVE  NEGATIVE   Comment:            The GeneXpert MRSA Assay (FDA     approved for NASAL specimens     only), is one component of a     comprehensive MRSA colonization     surveillance program. It is not     intended to diagnose MRSA     infection nor to guide or     monitor treatment for     MRSA infections.  GLUCOSE, CAPILLARY     Status: Abnormal   Collection Time    06/12/14  7:47 AM      Result Value Ref Range   Glucose-Capillary 153 (*) 70 - 99 mg/dL   Comment 1 Notify RN     Comment 2 Documented in Chart    CBC     Status: Abnormal   Collection Time    06/12/14  9:10 AM      Result Value Ref Range   WBC 3.8 (*) 4.0 -  10.5 K/uL   RBC 3.86 (*) 4.22 - 5.81 MIL/uL   Hemoglobin 10.7 (*) 13.0 - 17.0 g/dL   HCT 32.4 (*) 39.0 - 52.0 %   MCV 83.9  78.0 - 100.0 fL   MCH 27.7  26.0 - 34.0 pg   MCHC 33.0  30.0 - 36.0 g/dL   RDW 17.3 (*) 11.5 - 15.5 %   Platelets 80 (*) 150 - 400 K/uL   Comment: PLATELET COUNT CONFIRMED BY SMEAR     REPEATED TO VERIFY  BASIC METABOLIC PANEL     Status: Abnormal   Collection Time    06/12/14  9:10 AM      Result Value Ref Range   Sodium 143  137 - 147 mEq/L   Potassium 3.8  3.7 - 5.3 mEq/L   Chloride 107  96 - 112 mEq/L   CO2 23  19 - 32 mEq/L   Glucose, Bld 137 (*) 70 - 99 mg/dL   BUN 34 (*)  6 - 23 mg/dL   Creatinine, Ser 2.52 (*) 0.50 - 1.35 mg/dL   Calcium 10.1  8.4 - 10.5 mg/dL   GFR calc non Af Amer 22 (*) >90 mL/min   GFR calc Af Amer 26 (*) >90 mL/min   Comment: (NOTE)     The eGFR has been calculated using the CKD EPI equation.     This calculation has not been validated in all clinical situations.     eGFR's persistently <90 mL/min signify possible Chronic Kidney     Disease.   Anion gap 13  5 - 15  PROTIME-INR     Status: Abnormal   Collection Time    06/12/14  9:10 AM      Result Value Ref Range   Prothrombin Time 20.0 (*) 11.6 - 15.2 seconds   INR 1.70 (*) 0.00 - 1.49  GLUCOSE, CAPILLARY     Status: Abnormal   Collection Time    06/12/14 11:28 AM      Result Value Ref Range   Glucose-Capillary 154 (*) 70 - 99 mg/dL   Comment 1 Notify RN     Comment 2 Documented in Chart    GLUCOSE, CAPILLARY     Status: None   Collection Time    06/12/14  4:54 PM      Result Value Ref Range   Glucose-Capillary 96  70 - 99 mg/dL   Comment 1 Documented in Chart     Comment 2 Notify RN    GLUCOSE, CAPILLARY     Status: Abnormal   Collection Time    06/12/14  9:20 PM      Result Value Ref Range   Glucose-Capillary 142 (*) 70 - 99 mg/dL   Comment 1 Documented in Chart     Comment 2 Notify RN    CBC     Status: Abnormal   Collection Time    06/13/14  4:25 AM       Result Value Ref Range   WBC 4.1  4.0 - 10.5 K/uL   RBC 3.67 (*) 4.22 - 5.81 MIL/uL   Hemoglobin 10.1 (*) 13.0 - 17.0 g/dL   HCT 31.1 (*) 39.0 - 52.0 %   MCV 84.7  78.0 - 100.0 fL   MCH 27.5  26.0 - 34.0 pg   MCHC 32.5  30.0 - 36.0 g/dL   RDW 17.3 (*) 11.5 - 15.5 %   Platelets 88 (*) 150 - 400 K/uL   Comment: CONSISTENT WITH PREVIOUS RESULT  BASIC METABOLIC PANEL     Status: Abnormal   Collection Time    06/13/14  4:25 AM      Result Value Ref Range   Sodium 142  137 - 147 mEq/L   Potassium 4.3  3.7 - 5.3 mEq/L   Chloride 105  96 - 112 mEq/L   CO2 21  19 - 32 mEq/L   Glucose, Bld 130 (*) 70 - 99 mg/dL   BUN 36 (*) 6 - 23 mg/dL   Creatinine, Ser 2.63 (*) 0.50 - 1.35 mg/dL   Calcium 9.7  8.4 - 10.5 mg/dL   GFR calc non Af Amer 21 (*) >90 mL/min   GFR calc Af Amer 25 (*) >90 mL/min   Comment: (NOTE)     The eGFR has been calculated using the CKD EPI equation.     This calculation has not been validated in all clinical situations.     eGFR's persistently <90 mL/min signify possible Chronic Kidney  Disease.   Anion gap 16 (*) 5 - 15  PROTIME-INR     Status: Abnormal   Collection Time    06/13/14  4:25 AM      Result Value Ref Range   Prothrombin Time 17.4 (*) 11.6 - 15.2 seconds   INR 1.42  0.00 - 1.49  LIPID PANEL     Status: Abnormal   Collection Time    06/13/14  4:25 AM      Result Value Ref Range   Cholesterol 109  0 - 200 mg/dL   Triglycerides 61  <150 mg/dL   HDL 38 (*) >39 mg/dL   Total CHOL/HDL Ratio 2.9     VLDL 12  0 - 40 mg/dL   LDL Cholesterol 59  0 - 99 mg/dL   Comment:            Total Cholesterol/HDL:CHD Risk     Coronary Heart Disease Risk Table                         Men   Women      1/2 Average Risk   3.4   3.3      Average Risk       5.0   4.4      2 X Average Risk   9.6   7.1      3 X Average Risk  23.4   11.0                Use the calculated Patient Ratio     above and the CHD Risk Table     to determine the patient's CHD Risk.                 ATP III CLASSIFICATION (LDL):      <100     mg/dL   Optimal      100-129  mg/dL   Near or Above                        Optimal      130-159  mg/dL   Borderline      160-189  mg/dL   High      >190     mg/dL   Very High  GLUCOSE, CAPILLARY     Status: Abnormal   Collection Time    06/13/14  6:36 AM      Result Value Ref Range   Glucose-Capillary 128 (*) 70 - 99 mg/dL   Comment 1 Documented in Chart     Comment 2 Notify RN     Ct Head Wo Contrast  06/12/2014   CLINICAL DATA:  Reassess known intracranial hemorrhage  EXAM: CT HEAD WITHOUT CONTRAST  TECHNIQUE: Contiguous axial images were obtained from the base of the skull through the vertex without intravenous contrast.  COMPARISON:  Noncontrast CT scan of the brain dated 11 June 2014  FINDINGS: Again demonstrated is a hyperdense focus in the right anterior and mid pons. It measures 1.7 cm transversely x 0.9 cm AP and is slightly smaller overall in appearance since yesterday's study. There is no subarachnoid blood. There is no hemorrhagic focus elsewhere within the brain.  There is mild stable diffuse cerebral and cerebellar atrophy with compensatory ventriculomegaly. Stable decreased density in the deep white matter of both cerebral hemispheres is consistent with chronic small vessel ischemic change. Mild encephalomalacia in the posterior parietal deep white matter and adjacent cortex  is stable. The cerebellum exhibits no acute abnormality.  The calvarium is intact. The observed paranasal sinuses and mastoid air cells are clear.  IMPRESSION: 1. The known hemorrhagic focus within the anterior and right aspect of the pons has not increased in size and is in fact slightly less conspicuous today. No intracranial hemorrhage otherwise is demonstrated. 2. There are stable changes of chronic small vessel ischemia and diffuse atrophy.   Electronically Signed   By: David  Martinique   On: 06/12/2014 08:39   Ct Head (brain) Wo Contrast  06/11/2014    CLINICAL DATA:  Stroke like symptoms trauma slurred speech and leg weakness  EXAM: CT HEAD WITHOUT CONTRAST  TECHNIQUE: Contiguous axial images were obtained from the base of the skull through the vertex without intravenous contrast.  COMPARISON:  06/28/2013, 06/30/2013  FINDINGS: There is severe diffuse atrophy and low attenuation in the deep white matter diffusely. This is unchanged from the prior study. There is proportional dilatation of the ventricles. There is no evidence of abnormality involving the calvarium and there is no significant inflammatory change in the sinuses.  In the right pontomedullary junction there is a 14 x 22 mm oval focus of hyperattenuation that was not present previously. There is no extra-axial hemorrhage. There is no evidence of vascular territory ischemic infarct or mass. Atherosclerotic calcification of the intracranial vessels is again noted.  IMPRESSION: Superimposed on chronic hypertensive involutional change, there is a 22 x 14 mm hyper attenuating focus in the right pontomedullary junction most consistent with acute hemorrhage. Critical Value/emergent results were called by telephone at the time of interpretation on 06/11/2014 at 5:22 pm to Dr. Ezequiel Essex , who verbally acknowledged these results.   Electronically Signed   By: Skipper Cliche M.D.   On: 06/11/2014 17:22       Medical Problem List and Plan: 1. 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 Latest cranial CT scan stable 2.  DVT Prophylaxis/Anticoagulation: Subcutaneous heparin initiated for DVT prophylaxis 06/13/2014 3. Pain Management: Tylenol as needed. Monitor the increased mobility 4. Diabetes mellitus with peripheral neuropathy. Latest hemoglobin A1c 6.7. Check blood sugars a.c. and at bedtime.Tradjenta 5 mg daily. Patient on Levemir 15 units each bedtime and Onglyza 2.5 mg daily prior to admission. Plan to resume as needed 5. Neuropsych: This  patient is not capable of making decisions on his own behalf. 6. Skin/Wound Care: Routine skin checks 7. Chronic renal insufficiency. Baseline creatinine 2.84. Followup chemistries 8. Chronic diastolic congestive heart failure. Monitor for any signs of fluid overload. Patient on Lasix 80 mg  Daily. 9. Hypertension. Norvasc 10 mg daily, Coreg 6.25 mg twice a day. Monitor with increased mobility 10. CAD with history of CABG. No chest pain or shortness of breath 11. Hyperlipidemia. Lipitor 12. GERD. Protonix  Post Admission Physician Evaluation: 1. Functional deficits secondary  to right ponto-medullary ICH. 2. Patient is admitted to receive collaborative, interdisciplinary care between the physiatrist, rehab nursing staff, and therapy team. 3. Patient's level of medical complexity and substantial therapy needs in context of that medical necessity cannot be provided at a lesser intensity of care such as a SNF. 4. Patient has experienced substantial functional loss from his/her baseline which was documented above under the "Functional History" and "Functional Status" headings.  Judging by the patient's diagnosis, physical exam, and functional history, the patient has potential for functional progress which will result in measurable gains while on inpatient rehab.  These gains will be of substantial and  practical use upon discharge  in facilitating mobility and self-care at the household level. 5. Physiatrist will provide 24 hour management of medical needs as well as oversight of the therapy plan/treatment and provide guidance as appropriate regarding the interaction of the two. 6. 24 hour rehab nursing will assist with bladder management, bowel management, safety, skin/wound care, disease management, medication administration, pain management and patient education  and help integrate therapy concepts, techniques,education, etc. 7. PT will assess and treat for/with: Lower extremity strength, range of  motion, stamina, balance, functional mobility, safety, adaptive techniques and equipment, NMR, cognitive perceptual awareness, stroke education, family ed, egosupport.   Goals are: supervision to min assist. 8. OT will assess and treat for/with: ADL's, functional mobility, safety, upper extremity strength, adaptive techniques and equipment, NMR, cognitive perceptual rx, leisure awareness, family ed.   Goals are: supervision to min assist. Therapy may proceed with showering this patient. 9. SLP will assess and treat for/with: communication, cognition, speech.  Goals are: supervision to mod I. 10. Case Management and Social Worker will assess and treat for psychological issues and discharge planning. 11. Team conference will be held weekly to assess progress toward goals and to determine barriers to discharge. 12. Patient will receive at least 3 hours of therapy per day at least 5 days per week. 13. ELOS: 20-25 days       14. Prognosis:  excellent     Meredith Staggers, MD, [facility] 06/14/2014   06/13/2014

## 2014-06-16 ENCOUNTER — Inpatient Hospital Stay (HOSPITAL_COMMUNITY): Payer: Medicare HMO | Admitting: Occupational Therapy

## 2014-06-16 LAB — GLUCOSE, CAPILLARY
GLUCOSE-CAPILLARY: 149 mg/dL — AB (ref 70–99)
Glucose-Capillary: 83 mg/dL (ref 70–99)
Glucose-Capillary: 111 mg/dL — ABNORMAL HIGH (ref 70–99)
Glucose-Capillary: 163 mg/dL — ABNORMAL HIGH (ref 70–99)

## 2014-06-16 NOTE — Progress Notes (Signed)
Occupational Therapy Session Note  Patient Details  Name: [name] MRN: [id number] Date of Birth: 05/18/1931  Today's Date: 06/16/2014 OT Individual Time: 1100-1200 OT Individual Time Calculation (min): 60 min    Short Term Goals: Week 1:  OT Short Term Goal 1 (Week 1): Pt will perform toilet transfer with max A OT Short Term Goal 2 (Week 1): Pt will engage in 10 minutes of functional activity before requiring rest break in order to increase endurance. OT Short Term Goal 3 (Week 1): Pt maintain dynamic sitting balance with supervision for 5 minutes during activity. OT Short Term Goal 4 (Week 1): Pt will perform LB dressing with Max A.   Skilled Therapeutic Interventions/Progress Updates:    Engaged in ADL retraining with focus on bed mobility, static and dynamic sitting balance, sit <> stand, and increased use of LLLE to assist in bed mobility and bridging.  Pt incontinent of urine upon arrival, rolled Rt and Lt multiple times in bed with focus on increased participation and sequencing of rolling.  Pt able to initiate bending Lt knee however unable to complete functional movement requiring assist to position LLE to participate in rolling, by end of session pt able to flex Lt knee and perform bridging to assist in rolling and as needed for LB dressing.  Engaged in bathing seated at EOB with focus on anterior weight shift to participate in washing BLE, pt required mod assist due to Lt lateral lean in sitting which increased with dynamic movement.  Noted Lt shoulder elevation during seated task.   Pt required multiple short rest breaks during session due to decreased endurance, educated on pursed lip breathing and taking deep breaths in through nostrils when feeling SOB.  Engaged in sit > stand with +2 assist to provide tactile cues and manual facilitation to promote anterior weight shift.  Pt with no clothes, therefore donned hospital gown and returned to bed.  Pt with demonstration of LLE  functional movement with increased Lt knee flexion, encouraged activity during downtime and engaged in bridging x5 with pt with increase sustained hold each time 3-5 seconds.  Pt encouraged by increased function in LLE.    Therapy Documentation Precautions:  Precautions Precautions: Fall Restrictions Weight Bearing Restrictions: No Pain:  Pt with no c/o pain  See FIM for current functional status  Therapy/Group: Individual Therapy  Simonne Come 06/16/2014, 12:07 PM

## 2014-06-16 NOTE — Progress Notes (Signed)
Subjective/Complaints: 80 y.o. right-handed male with history diabetes mellitus with peripheral neuropathy, CAD status post CABG, atrial flutter on chronic Coumadin, chronic renal insufficiency with baseline creatinine 8.41, chronic diastolic congestive heart failure. Independent with occasional cane and driving living with family. Admitted 06/11/2049 of left-sided weakness and slurred speech as well as recent fall but denies striking his head. Cranial CT scan shows a right pontomedullary 14 x 22 mm oval focus consistent with acute hemorrhage. INR on admission of 3.24 and systolic blood pressure 401. Placed on intravenous Lopressor and Coumadin was reversed. Followup cranial CT scan unchanged. 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 Tolerating a regular consistency diet. Coumadin remains on hold with cardiac rate control.  Pt ate all of breakfast , finished milk  Review of Systems - Negative except weak on left  Objective: Vital Signs: Blood pressure 144/62, pulse 54, temperature 98.2 F (36.8 C), temperature source Oral, resp. rate 17, SpO2 95.00%. No results found. Results for orders placed during the hospital encounter of 06/14/14 (from the past 72 hour(s))  GLUCOSE, CAPILLARY     Status: Abnormal   Collection Time    06/14/14  5:43 PM      Result Value Ref Range   Glucose-Capillary 199 (*) 70 - 99 mg/dL  GLUCOSE, CAPILLARY     Status: Abnormal   Collection Time    06/14/14 10:34 PM      Result Value Ref Range   Glucose-Capillary 160 (*) 70 - 99 mg/dL  GLUCOSE, CAPILLARY     Status: Abnormal   Collection Time    06/15/14  6:58 AM      Result Value Ref Range   Glucose-Capillary 118 (*) 70 - 99 mg/dL  GLUCOSE, CAPILLARY     Status: Abnormal   Collection Time    06/15/14 11:49 AM      Result Value Ref Range   Glucose-Capillary 165 (*) 70 - 99 mg/dL  GLUCOSE, CAPILLARY     Status: Abnormal   Collection Time    06/15/14  4:25 PM      Result Value  Ref Range   Glucose-Capillary 167 (*) 70 - 99 mg/dL   Comment 1 Notify RN    GLUCOSE, CAPILLARY     Status: None   Collection Time    06/15/14  9:19 PM      Result Value Ref Range   Glucose-Capillary 94  70 - 99 mg/dL  GLUCOSE, CAPILLARY     Status: None   Collection Time    06/16/14  6:35 AM      Result Value Ref Range   Glucose-Capillary 83  70 - 99 mg/dL     HEENT: normal Cardio: RRR and no murmur Resp: CTA B/L and unlabored GI: BS positive and NT, ND Extremity:  No Edema Skin:   Other Left transerve healed breast incision at nipple level, left upper back incion well healed, dry skin both feet without open areas Neuro: Lethargic, Flat, Cranial Nerve Abnormalities Left central 7, Abnormal Sensory decreased in both feet, Abnormal Motor bilateral hand intrinsic atrophy and Other mpotor 2- Left bi, tri grip, 3- Left HF, KE , 2- ankle DF Musc/Skel:  Other no pain with AROM in BUE adn BLE, or with neck movement, sitting balance fair minus Gen NAD   Assessment/Plan: 1. Functional deficits secondary to RIght ponomedullary hemorrhage with Left hemiparesis superimposed on severe diabetic neuropathy which require 3+ hours per day of interdisciplinary therapy in a comprehensive inpatient rehab setting. Physiatrist is  providing close team supervision and 24 hour management of active medical problems listed below. Physiatrist and rehab team continue to assess barriers to discharge/monitor patient progress toward functional and medical goals. FIM: FIM - Bathing Bathing Steps Patient Completed: Chest;Right Arm;Left Arm;Abdomen;Right upper leg;Left upper leg Bathing: 3: Mod-Patient completes 5-7 72f 10 parts or 50-74%  FIM - Upper Body Dressing/Undressing Upper body dressing/undressing: 0: Wears gown/pajamas-no public clothing FIM - Lower Body Dressing/Undressing Lower body dressing/undressing: 0: Wears gown/pajamas-no public clothing  FIM - Toileting Toileting: 0: Activity did not  occur  FIM - Air cabin crew Transfers: 0-Activity did not occur  FIM - Control and instrumentation engineer Devices: Bed rails Bed/Chair Transfer: 2: Supine > Sit: Max A (lifting assist/Pt. 25-49%);2: Bed > Chair or W/C: Max A (lift and lower assist)  FIM - Locomotion: Wheelchair Distance: 40 Locomotion: Wheelchair: 1: Travels less than 50 ft with maximal assistance (Pt: 25 - 49%) FIM - Locomotion: Ambulation Locomotion: Ambulation: 0: Activity did not occur  Comprehension Comprehension Mode: Auditory Comprehension: 5-Understands basic 90% of the time/requires cueing < 10% of the time  Expression Expression Mode: Verbal Expression: 5-Expresses basic needs/ideas: With extra time/assistive device  Social Interaction Social Interaction: 5-Interacts appropriately 90% of the time - Needs monitoring or encouragement for participation or interaction.  Problem Solving Problem Solving: 4-Solves basic 75 - 89% of the time/requires cueing 10 - 24% of the time  Memory Memory: 5-Recognizes or recalls 90% of the time/requires cueing < 10% of the time  Medical Problem List and Plan:  1. 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 Latest cranial CT scan stable  2. DVT Prophylaxis/Anticoagulation: Subcutaneous heparin initiated for DVT prophylaxis 06/13/2014  3. Pain Management: Tylenol as needed. Monitor the increased mobility  4. Diabetes mellitus with peripheral neuropathy. Latest hemoglobin A1c 6.7. Check blood sugars a.c. and at bedtime.Tradjenta 5 mg daily. Patient on Levemir 15 units each bedtime and Onglyza 2.5 mg daily prior to admission. Plan to resume as needed  5. Neuropsych: This patient is not capable of making decisions on his own behalf.  6. Skin/Wound Care: Routine skin checks  7. Chronic renal insufficiency. Baseline creatinine 2.84. Followup chemistries  8. Chronic diastolic congestive heart failure.  Monitor for any signs of fluid overload. Patient on Lasix 80 mg Daily.  9. Hypertension. Norvasc 10 mg daily, Coreg 6.25 mg twice a day. Monitor with increased mobility  10. CAD with history of CABG. No chest pain or shortness of breath  11. Hyperlipidemia. Lipitor  12. GERD. Protonix   LOS (Days) 2 A FACE TO FACE EVALUATION WAS PERFORMED  KIRSTEINS,ANDREW E 06/16/2014, 9:00 AM

## 2014-06-17 ENCOUNTER — Inpatient Hospital Stay (HOSPITAL_COMMUNITY): Payer: Medicare Other | Admitting: Speech Pathology

## 2014-06-17 ENCOUNTER — Inpatient Hospital Stay (HOSPITAL_COMMUNITY): Payer: Medicare HMO | Admitting: Physical Therapy

## 2014-06-17 ENCOUNTER — Inpatient Hospital Stay (HOSPITAL_COMMUNITY): Payer: Medicare HMO | Admitting: Occupational Therapy

## 2014-06-17 LAB — COMPREHENSIVE METABOLIC PANEL
ALT: 10 U/L (ref 0–53)
AST: 20 U/L (ref 0–37)
Albumin: 3.5 g/dL (ref 3.5–5.2)
Alkaline Phosphatase: 94 U/L (ref 39–117)
Anion gap: 16 — ABNORMAL HIGH (ref 5–15)
BUN: 52 mg/dL — ABNORMAL HIGH (ref 6–23)
CO2: 19 meq/L (ref 19–32)
Calcium: 9.7 mg/dL (ref 8.4–10.5)
Chloride: 100 mEq/L (ref 96–112)
Creatinine, Ser: 3 mg/dL — ABNORMAL HIGH (ref 0.50–1.35)
GFR, EST AFRICAN AMERICAN: 21 mL/min — AB (ref >90)
GFR, EST NON AFRICAN AMERICAN: 18 mL/min — AB (ref >90)
GLUCOSE: 153 mg/dL — AB (ref 70–99)
Potassium: 4.5 mEq/L (ref 3.7–5.3)
SODIUM: 135 meq/L — AB (ref 137–147)
TOTAL PROTEIN: 7.8 g/dL (ref 6.0–8.3)
Total Bilirubin: 0.5 mg/dL (ref 0.3–1.2)

## 2014-06-17 LAB — GLUCOSE, CAPILLARY
GLUCOSE-CAPILLARY: 244 mg/dL — AB (ref 70–99)
GLUCOSE-CAPILLARY: 164 mg/dL — AB (ref 70–99)
Glucose-Capillary: 110 mg/dL — ABNORMAL HIGH (ref 70–99)
Glucose-Capillary: 151 mg/dL — ABNORMAL HIGH (ref 70–99)

## 2014-06-17 LAB — CBC WITH DIFFERENTIAL/PLATELET
Basophils Absolute: 0 10*3/uL (ref 0.0–0.1)
Basophils Relative: 0 % (ref 0–1)
Eosinophils Absolute: 0 10*3/uL (ref 0.0–0.7)
Eosinophils Relative: 0 % (ref 0–5)
HCT: 32 % — ABNORMAL LOW (ref 39.0–52.0)
Hemoglobin: 10.7 g/dL — ABNORMAL LOW (ref 13.0–17.0)
LYMPHS ABS: 0.9 10*3/uL (ref 0.7–4.0)
LYMPHS PCT: 19 % (ref 12–46)
MCH: 28.2 pg (ref 26.0–34.0)
MCHC: 33.4 g/dL (ref 30.0–36.0)
MCV: 84.2 fL (ref 78.0–100.0)
Monocytes Absolute: 0.4 10*3/uL (ref 0.1–1.0)
Monocytes Relative: 8 % (ref 3–12)
NEUTROS PCT: 73 % (ref 43–77)
Neutro Abs: 3.6 10*3/uL (ref 1.7–7.7)
PLATELETS: 97 10*3/uL — AB (ref 150–400)
RBC: 3.8 MIL/uL — AB (ref 4.22–5.81)
RDW: 17 % — ABNORMAL HIGH (ref 11.5–15.5)
WBC: 4.9 10*3/uL (ref 4.0–10.5)

## 2014-06-17 NOTE — Progress Notes (Signed)
Patient information reviewed and entered into eRehab system by Liana Camerer, RN, CRRN, PPS Coordinator.  Information including medical coding and functional independence measure will be reviewed and updated through discharge.     Per nursing patient was given "Data Collection Information Summary for Patients in Inpatient Rehabilitation Facilities with attached "Privacy Act Statement-Health Care Records" upon admission.  

## 2014-06-17 NOTE — Progress Notes (Signed)
Physical Therapy Session Note  Patient Details  Name: [name] MRN: [id number] Date of Birth: 1931/01/26  Today's Date: 06/17/2014 PT Individual Time: 8502-7741 and 1445-1517 PT Individual Time Calculation (min): 45 min and 32 min  Short Term Goals: Week 1:  PT Short Term Goal 1 (Week 1): Pt will demonstrate rolling L in bed req min A PT Short Term Goal 2 (Week 1): Pt will demonstrate supine to sit req mod A PT Short Term Goal 3 (Week 1): Pt will demonstrate stand-pivot transfer bed to w/c req mod A.  PT Short Term Goal 4 (Week 1): Pt will self propel manual w/c x 50' req min A PT Short Term Goal 5 (Week 1): Pt will tolerate ambulation with 2 person assist.   Skilled Therapeutic Interventions/Progress Updates:    Treatment Session 1: Pt received seated in w/c with OT present having just completed B&D. Pt agreeable to therapy. Session focused on increasing stability/independence with funcitonal transfers, attempting gait. Transported pt to treatment gym in w/c with total A for energy conservation. Pt performed squat pivot transfer from w/c>mat table with Total A of single therapist, manual stabilization of R foot (to maintain position on ground), and max multimodal cueing for full anterior weight shift with inconsistent within-session carryover. Performed lateral scooting transfer from mat table>w/c with slide board and Total A, manual stabilization and max cueing as described above. Performed sit>stand from w/c with +2Total A. Performed standing with 3 musketeers assist, multimodal cueing to facilitate upright posture with inconsistent return demonstration, poor carryover. Pt with tendenccy toward pushing trunk to L side in static standing. Transitioned to gait x2 steps with +2Total A (3 musketeers assist) with manual facilitation of lateral weight shift to R side. Pt required manual facilitation of LE advancement bilaterally. Gait trial ended due to increasingly more prominent hip/knee  flexion. When pt asked what to do if in need, pt pointed to bed and stated, "I could try." Educated pt on importance of using call bell for staff assist, not getting out of w/c without staff present. Pt verbalized understanding. Pt left seated in w/c at nurses' station with quick release belt in place for safety and all needs within reach.  Treatment Session 2: Pt received semi reclined in bed; agreeable to therapy. Session focused on increasing pt independence with bed mobility. Pt performed multiple trials of supine>sit to L side (for increased LUE weightbearing, muscle activation) with HOB flat using rail requiring max A initially, mod A during final trial. Performed sit>supine with HOB flat, no rail initially requiring mod A with verbal cueing to promote LLE management using RLE; final trial with min A, increased time, and multimodal cueing. In supine, pt performed multiple trials for scooting to [facility] with R rail and tactile cueing for bilat LE weightbearing; pt able to actively perform bilat hip/knee flexion in supine. Departed pt room with pt semi reclined in bed with 3 bed rails up, bed alarm on, and all needs within reach.  Therapy Documentation Precautions:  Precautions Precautions: Fall Restrictions Weight Bearing Restrictions: No Pain: Pain Assessment Pain Assessment: No/denies pain Pain Score: 0-No pain Locomotion : Ambulation Ambulation/Gait Assistance: 1: +2 Total assist   See FIM for current functional status  Therapy/Group: Individual Therapy  Maijor Hornig, Malva Cogan 06/17/2014, 12:25 PM

## 2014-06-17 NOTE — Progress Notes (Signed)
Speech Language Pathology Daily Session Note  Patient Details  Name: [name] MRN: [id number] Date of Birth: 14-Oct-1930  Today's Date: 06/17/2014 SLP Individual Time: 1300-1400 SLP Individual Time Calculation (min): 60 min  Short Term Goals: Week 1: SLP Short Term Goal 1 (Week 1): Patient will utilize speech strategies (slow down, over-articulate, pause between words) to achieve 75% intelligibility at phrase level, with minimal cues. SLP Short Term Goal 2 (Week 1): Patient will demonstrate anticipatory awareness by describing impact of speech and physical deficits, with moderate cues. SLP Short Term Goal 3 (Week 1): Patient will recall and demonstrate understanding of safety precautions and strategies learned in PT, OT, SLP treatment sessions, with minimal cues. SLP Short Term Goal 4 (Week 1): Patient will solve basic level functional problems wtih 80% accuracy.  Skilled Therapeutic Interventions:  Pt was seen for skilled speech therapy targeting cognition and dysphagia.  Upon arrival, pt was seated upright in bed feeding himself lunch.  Pt was noted to take large, consecutive sips from the straw which resulted in overt s/s of aspiration characterized by an immediate cough.  SLP initiated trials of cup sips of thin liquids with pt continuing to take large, consecutive cup sips with immediate cough.  No overt s/s of aspiration noted with supervision instructional cues to take small sips.  Recommend that pt be downgraded to thin liquids via cup sips with full supervision for use of swallowing precautions.  RN made aware SLP also initiated skilled education related to oral motor exercises to improve left labial lingual weakness with pt able to return demonstration of exercises with mod faded to min assist multimodal cuing.  SLP also initiated skilled education related to compensatory strategies to improve speech intelligibility with pt able to return demonstration of at least 1 strategy during a  structured word-level task with mod assist multimodal cuing.   Continue per current plan of care.   FIM:  Comprehension Comprehension Mode: Auditory Comprehension: 5-Understands basic 90% of the time/requires cueing < 10% of the time Expression Expression Mode: Verbal Expression: 3-Expresses basic 50 - 74% of the time/requires cueing 25 - 50% of the time. Needs to repeat parts of sentences. Social Interaction Social Interaction: 5-Interacts appropriately 90% of the time - Needs monitoring or encouragement for participation or interaction. Problem Solving Problem Solving: 4-Solves basic 75 - 89% of the time/requires cueing 10 - 24% of the time Memory Memory: 3-Recognizes or recalls 50 - 74% of the time/requires cueing 25 - 49% of the time FIM - Eating Eating Activity: 5: Set-up assist for open containers;5: Set-up assist for cut food  Pain Pain Assessment Pain Assessment: No/denies pain  Therapy/Group: Individual Therapy  [name], M.A. CCC-SLP  Trinadee Verhagen, Selinda Orion 06/17/2014, 4:23 PM

## 2014-06-17 NOTE — Progress Notes (Signed)
Occupational Therapy Session Note  Patient Details  Name: [name] MRN: [id number] Date of Birth: April 14, 1931  Today's Date: 06/17/2014 OT Individual Time: 3832-9191 OT Individual Time Calculation (min): 50 min    Short Term Goals: Week 1:  OT Short Term Goal 1 (Week 1): Pt will perform toilet transfer with max A OT Short Term Goal 2 (Week 1): Pt will engage in 10 minutes of functional activity before requiring rest break in order to increase endurance. OT Short Term Goal 3 (Week 1): Pt maintain dynamic sitting balance with supervision for 5 minutes during activity. OT Short Term Goal 4 (Week 1): Pt will perform LB dressing with Max A.   Skilled Therapeutic Interventions/Progress Updates:    Engaged in ADL retraining with focus on bed mobility, sitting balance, attention to task, and sit > stand.  Pt in bed incontinent of bowel and urine upon arrival.  Engaged in rolling Rt and Lt to clean perineal area with pt demonstrating carryover of use of bed rails and body mechanics for rolling.  Physical assist to bend Lt knee to assist in rolling and positioning of LUE to increase participation in rolling.  Bathing completed seated at EOB with focus on increased participation and anterior pelvic tilt.  Pt with left lateral lean in unsupported sitting, requiring mod assist to maintain midline sitting balance, utilized WB down on Rt elbow during active rest breaks to improve midline sitting.  WB through Lt elbow on therapist leg while washing BLE with further focus on weight shifting.  Attempted squat pivot bed > w/c with +2 over the back with max verbal cues for forward weight shifting, still unable to lift buttocks to perform squat pivot.  Performed stand pivot transfer with +2 under the shoulder (3 musketeer style) for sit > stand and then manual facilitation to reposition BLE to pivot in front of w/c. Pt passed off to PT.  Therapy Documentation Precautions:  Precautions Precautions:  Fall Restrictions Weight Bearing Restrictions: No Pain: Pain Assessment Pain Assessment: No/denies pain Pain Score: 0-No pain  See FIM for current functional status  Therapy/Group: Individual Therapy  Simonne Come 06/17/2014, 10:55 AM

## 2014-06-17 NOTE — Progress Notes (Signed)
Subjective/Complaints: 80 y.o. right-handed male with history diabetes mellitus with peripheral neuropathy, CAD status post CABG, atrial flutter on chronic Coumadin, chronic renal insufficiency with baseline creatinine 7.58, chronic diastolic congestive heart failure. Independent with occasional cane and driving living with family. Admitted 06/11/2049 of left-sided weakness and slurred speech as well as recent fall but denies striking his head. Cranial CT scan shows a right pontomedullary 14 x 22 mm oval focus consistent with acute hemorrhage. INR on admission of 8.32 and systolic blood pressure 549. Placed on intravenous Lopressor and Coumadin was reversed. Followup cranial CT scan unchanged. 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 Tolerating a regular consistency diet. Coumadin remains on hold with cardiac rate control.  Foul urine odor but denies bladder problems Denies bowel or breathing issues  Review of Systems - Negative except weak on left  Objective: Vital Signs: Blood pressure 148/50, pulse 52, temperature 97.9 F (36.6 C), temperature source Oral, resp. rate 18, SpO2 98.00%. No results found. Results for orders placed during the hospital encounter of 06/14/14 (from the past 72 hour(s))  GLUCOSE, CAPILLARY     Status: Abnormal   Collection Time    06/14/14  5:43 PM      Result Value Ref Range   Glucose-Capillary 199 (*) 70 - 99 mg/dL  GLUCOSE, CAPILLARY     Status: Abnormal   Collection Time    06/14/14 10:34 PM      Result Value Ref Range   Glucose-Capillary 160 (*) 70 - 99 mg/dL  GLUCOSE, CAPILLARY     Status: Abnormal   Collection Time    06/15/14  6:58 AM      Result Value Ref Range   Glucose-Capillary 118 (*) 70 - 99 mg/dL  GLUCOSE, CAPILLARY     Status: Abnormal   Collection Time    06/15/14 11:49 AM      Result Value Ref Range   Glucose-Capillary 165 (*) 70 - 99 mg/dL  GLUCOSE, CAPILLARY     Status: Abnormal   Collection Time   06/15/14  4:25 PM      Result Value Ref Range   Glucose-Capillary 167 (*) 70 - 99 mg/dL   Comment 1 Notify RN    GLUCOSE, CAPILLARY     Status: None   Collection Time    06/15/14  9:19 PM      Result Value Ref Range   Glucose-Capillary 94  70 - 99 mg/dL  GLUCOSE, CAPILLARY     Status: None   Collection Time    06/16/14  6:35 AM      Result Value Ref Range   Glucose-Capillary 83  70 - 99 mg/dL  GLUCOSE, CAPILLARY     Status: Abnormal   Collection Time    06/16/14 11:59 AM      Result Value Ref Range   Glucose-Capillary 111 (*) 70 - 99 mg/dL   Comment 1 Notify RN    GLUCOSE, CAPILLARY     Status: Abnormal   Collection Time    06/16/14  4:38 PM      Result Value Ref Range   Glucose-Capillary 149 (*) 70 - 99 mg/dL   Comment 1 Notify RN    GLUCOSE, CAPILLARY     Status: Abnormal   Collection Time    06/16/14  9:52 PM      Result Value Ref Range   Glucose-Capillary 163 (*) 70 - 99 mg/dL     HEENT: normal Cardio: RRR and no murmur Resp: CTA B/L  and unlabored GI: BS positive and NT, ND Extremity:  No Edema Skin:   Other Left transerve healed breast incision at nipple level, left upper back incion well healed, dry skin both feet without open areas Neuro: Lethargic, Flat, Cranial Nerve Abnormalities Left central 7, Abnormal Sensory decreased in both feet, Abnormal Motor bilateral hand intrinsic atrophy and Other mpotor 2- Left bi, tri grip, 3- Left HF, KE , 2- ankle DF Musc/Skel:  Other no pain with AROM in BUE and BLE, or with neck movement, sitting balance fair minus Gen NAD   Assessment/Plan: 1. Functional deficits secondary to RIght ponomedullary hemorrhage with Left hemiparesis superimposed on severe diabetic neuropathy which require 3+ hours per day of interdisciplinary therapy in a comprehensive inpatient rehab setting. Physiatrist is providing close team supervision and 24 hour management of active medical problems listed below. Physiatrist and rehab team continue to  assess barriers to discharge/monitor patient progress toward functional and medical goals. FIM: FIM - Bathing Bathing Steps Patient Completed: Chest;Left Arm;Abdomen;Right upper leg;Left upper leg Bathing: 3: Mod-Patient completes 5-7 29f 10 parts or 50-74%  FIM - Upper Body Dressing/Undressing Upper body dressing/undressing: 0: Wears gown/pajamas-no public clothing FIM - Lower Body Dressing/Undressing Lower body dressing/undressing: 0: Wears gown/pajamas-no public clothing  FIM - Toileting Toileting: 0: Activity did not occur  FIM - Air cabin crew Transfers: 0-Activity did not occur  FIM - Control and instrumentation engineer Devices: Bed rails Bed/Chair Transfer: 2: Supine > Sit: Max A (lifting assist/Pt. 25-49%);3: Sit > Supine: Mod A (lifting assist/Pt. 50-74%/lift 2 legs)  FIM - Locomotion: Wheelchair Distance: 40 Locomotion: Wheelchair: 1: Travels less than 50 ft with maximal assistance (Pt: 25 - 49%) FIM - Locomotion: Ambulation Locomotion: Ambulation: 0: Activity did not occur  Comprehension Comprehension Mode: Auditory Comprehension: 5-Follows basic conversation/direction: With extra time/assistive device  Expression Expression Mode: Verbal Expression: 5-Expresses basic needs/ideas: With extra time/assistive device  Social Interaction Social Interaction: 5-Interacts appropriately 90% of the time - Needs monitoring or encouragement for participation or interaction.  Problem Solving Problem Solving: 4-Solves basic 75 - 89% of the time/requires cueing 10 - 24% of the time  Memory Memory: 5-Recognizes or recalls 90% of the time/requires cueing < 10% of the time  Medical Problem List and Plan:  1. 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 Latest cranial CT scan stable  2. DVT Prophylaxis/Anticoagulation: Subcutaneous heparin initiated for DVT prophylaxis 06/13/2014  3. Pain Management: Tylenol  as needed. Monitor the increased mobility  4. Diabetes mellitus with peripheral neuropathy. Latest hemoglobin A1c 6.7. Check blood sugars a.c. and at bedtime.Tradjenta 5 mg daily. Patient on Levemir 15 units each bedtime and Onglyza 2.5 mg daily prior to admission. Plan to resume as needed  5. Neuropsych: This patient is not capable of making decisions on his own behalf.  6. Skin/Wound Care: Routine skin checks  7. Chronic renal insufficiency. Baseline creatinine 2.84. Followup chemistries  8. Chronic diastolic congestive heart failure. Monitor for any signs of fluid overload. Patient on Lasix 80 mg Daily.  9. Hypertension. Norvasc 10 mg daily, Coreg 6.25 mg twice a day. Monitor with increased mobility  10. CAD with history of CABG. No chest pain or shortness of breath  11. Hyperlipidemia. Lipitor  12. GERD. Protonix   LOS (Days) 3 A FACE TO FACE EVALUATION WAS PERFORMED  Shawntavia Saunders E 06/17/2014, 7:26 AM

## 2014-06-17 NOTE — IPOC Note (Signed)
Overall Plan of Care [facility]) Patient Details Name: [name] MRN: [id number] DOB: 02/17/1931  Admitting Diagnosis: R CVA  Hospital Problems: Active Problems:   ICH (intracerebral hemorrhage)     Functional Problem List: Nursing Bladder;Bowel;Edema;Endurance;Medication Management;Nutrition;Pain;Safety;Skin Integrity  PT Balance;Endurance;Motor;Safety;Other (comment);Pain (cognition)  OT Balance;Cognition;Endurance;Motor;Safety;Vision  SLP Cognition;Safety  TR         Basic ADL's: OT Grooming;Bathing;Dressing;Toileting     Advanced  ADL's: OT       Transfers: PT Bed Mobility;Bed to Chair;Car;Furniture  OT Toilet;Tub/Shower     Locomotion: PT Ambulation;Wheelchair Mobility;Stairs     Additional Impairments: OT Fuctional Use of Upper Extremity  SLP None      TR      Anticipated Outcomes Item Anticipated Outcome  Self Feeding    Swallowing  Independent    Basic self-care  min A  Toileting  min A   Bathroom Transfers min A  Bowel/Bladder  continent of bowel and bladder with modified independence  Transfers  min A  Locomotion  Supervison w/c propulsion; min A ambulation  Communication  80% intelligibility at sentence level with use of speech strategies.  Cognition  modified independent for basic problem solving, supervision for safety, min assist for complex problem solving  Pain  3 or less on scale of 1-10  Safety/Judgment  supervision   Therapy Plan: PT Intensity: Minimum of 1-2 x/day ,45 to 90 minutes PT Frequency: 5 out of 7 days PT Duration Estimated Length of Stay: 3-4 weeks OT Intensity: Minimum of 1-2 x/day, 45 to 90 minutes OT Frequency: 5 out of 7 days OT Duration/Estimated Length of Stay: 3-4 weeks SLP Intensity: Minumum of 1-2 x/day, 30 to 90 minutes SLP Frequency: 5 out of 7 days SLP Duration/Estimated Length of Stay: 10-14 days       Team Interventions: Nursing Interventions Patient/Family Education;Bladder Management;Bowel  Management;Disease Management/Prevention;Pain Management;Medication Management;Psychosocial Support;Discharge Planning  PT interventions Ambulation/gait training;Balance/vestibular training;Cognitive remediation/compensation;Community reintegration;Functional electrical stimulation;DME/adaptive equipment instruction;Disease management/prevention;Discharge planning;Functional mobility training;Neuromuscular re-education;Pain management;Patient/family education;Splinting/orthotics;Psychosocial support;Therapeutic Activities;Therapeutic Exercise;UE/LE Strength taining/ROM;UE/LE Coordination activities;Wheelchair propulsion/positioning;Stair training;Visual/perceptual remediation/compensation  OT Interventions Balance/vestibular training;Community reintegration;Neuromuscular re-education;Patient/family education;Self Care/advanced ADL retraining;Therapeutic Exercise;UE/LE Coordination activities;Wheelchair propulsion/positioning;Visual/perceptual remediation/compensation;UE/LE Strength taining/ROM;Therapeutic Activities;Functional mobility training;DME/adaptive equipment instruction;Discharge planning;Cognitive remediation/compensation  SLP Interventions Cognitive remediation/compensation;Speech/Language facilitation;Cueing hierarchy;Functional tasks;Internal/external aids;Patient/family education;Therapeutic Exercise  TR Interventions    SW/CM Interventions      Team Discharge Planning: Destination: PT-Home (or SNF tbd) ,OT- Home , SLP-Home Projected Follow-up: PT-Home health PT;24 hour supervision/assistance;Skilled nursing facility;Other (comment) (tbd pending family support and progress), OT-  24 hour supervision/assistance;Home health OT;Outpatient OT, SLP-Home Health SLP Projected Equipment Needs: PT-To be determined;Wheelchair cushion (measurements);Wheelchair (measurements), OT- 3 in 1 bedside comode;Tub/shower seat, SLP-None recommended by SLP Equipment Details: PT- , OT-  Patient/family involved  in discharge planning: PT- Patient,  OT-Patient, SLP-Patient  MD ELOS: 16-22 d Medical Rehab Prognosis:  Good Assessment: 80 y.o. right-handed male with history diabetes mellitus with peripheral neuropathy, CAD status post CABG, atrial flutter on chronic Coumadin, chronic renal insufficiency with baseline creatinine 4.09, chronic diastolic congestive heart failure. Independent with occasional cane and driving living with family. Admitted 06/11/2049 of left-sided weakness and slurred speech as well as recent fall but denies striking his head. Cranial CT scan shows a right pontomedullary 14 x 22 mm oval focus consistent with acute hemorrhage. INR on admission of 8.11 and systolic blood pressure 914. Placed on intravenous Lopressor and Coumadin was reversed. Followup cranial CT scan unchanged. 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 Tolerating a regular consistency diet.  Coumadin remains on hold with cardiac rate control  Now requiring 24/7 Rehab RN,MD, as well as CIR level PT, OT and SLP.  Treatment team will focus on ADLs and mobility with goals set at [facility]   See Team Conference Notes for weekly updates to the plan of care

## 2014-06-17 NOTE — Progress Notes (Signed)
[facility] Admission Coordinator Signed Physical Medicine and Rehabilitation PMR Pre-admission Service date: 06/14/2014 2:11 PM  Related encounter: Admission (Discharged) from 06/11/2014 in [place]   PMR Admission Coordinator Pre-Admission Assessment  Patient: [name] is an 80 y.o., male  MRN: [id number]  DOB: 1930-11-14  Height: $Remove'5\' 11"'utZfubS$  (180.3 cm)  Weight: 81.149 kg (178 lb 14.4 oz)  Insurance Information  HMO: yes PPO: PCP: IPA: 80/20: OTHER:  PRIMARY: [employer] Medicare Policy#: [id number] Subscriber: self  CM Name: Su Hoff, RN Phone#: [phone], ext [phone] Fax#: [phone]  Authorization given for seven days from 06-14-14 to 06-20-14 with updates due to [facility] on 06-19-14.  Pre-Cert#: [id number] Employer: retired  Benefits: Phone #: [phone] Name: Grace Isaac. Date: 02-25-14 Deduct: none Out of Pocket Max: [phone] (met $91.19) Life Max: unlimited  CIR: $240/copay for days 1-6 (up to max for $1440 for admission) SNF: $0/day for days 1-20; $140 copay/day for days 21-100; visit limit based on medical necessity  Outpatient: 100% Co-Pay: $40 copay  Home Health: 100% Co-Pay: visit limit based on medical necessity  DME: 80% Co-Pay: 20%  Providers: in network  Emergency Contact Information    Contact Information     Name  Relation  Home  Work  Mobile     [place]  Daughter    [phone]     Jamaurion, Slemmer    [phone]       Current Medical History  Patient Admitting Diagnosis: Right pontomedullary hematoma  History of Present Illness: [name] is a 80 y.o. right-handed male with history of CAD status post CABG, atrial flutter on chronic Coumadin, chronic renal insufficiency with baseline creatinine 2.54, chronic diastolic congestive heart failure. Independent with occasional cane and driving living with family. Admitted 06/11/2049 of left-sided weakness and slurred speech as well as recent fall but  denies striking his head. Cranial CT scan shows a right pontomedullary 14 x 22 mm oval focus consistent with acute hemorrhage. INR on admission of 2.70 and systolic blood pressure 623. Placed on intravenous Lopressor and Coumadin was reversed. Followup cranial CT scan unchanged. 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 Tolerating a regular consistency diet. Physical therapy evaluation completed 06/12/2014 with recommendations of physical medicine and rehabilitation consult.  NIH Total: 5  Past Medical History    Past Medical History    Diagnosis  Date    .  CAD (coronary artery disease)       s/p 3v CABG 2006, myoview 04/2010 EF 49%, prior inferior/apical infarct, no ischemia, LHC 11/2011 stable anatomy (occluded LAD filled from vein graft, distal LCx occluded prior to OM2, OM2 filled from vein graft, RCA prox 25%, mid 40%, distal 25% lesions, SVG to diagonal occluded) Med Rx    .  Chronic diastolic heart failure       hx of cardiorenal syndrome; Echo 06/2012 EF 76-28%, mod diastolic dysfunction, mild MR, mod TR, mild R/LAE, mild RV dilatation, PASP 34mmHg.    .  Carotid stenosis       a. Carotid dopplers R 40-59%, Left 60-79%; b. carotids 12/13: 3-15% RICA, 17-61% LICA (rpt in 6 mos); c. Carotid US (11/14): R 40-59%; L 60-79%, R vertebral occluded, L vertebral antegrade    .  HLD (hyperlipidemia)     .  Mediastinal adenopathy       per CT chest in 2006 with PET scan showing very limited metabolic activity; not felt to have a significant neoplastic potential    .  Iron deficiency anemia     .  Hypertension     .  Diabetes mellitus       insulin dependent    .  GERD (gastroesophageal reflux disease)     .  AV block, Mobitz 1       Noted 11/2011 in hospital, BB stopped    .  Colon polyps     .  Diverticulosis     .  Pulmonary arterial hypertension       a. RHC 11/1 PASP 60 mmHg (mean 38); b. RHC 11/15 PA pressure 66/24, PCWP 26 and CO 6.2 => Sildenafil started; c. Echo  bubble study 11/13: no obvious shunt; d. VQ scan neg for pulmonary embolism    .  CKD (chronic kidney disease) stage 5, GFR less than 15 ml/min       hx of cardiorenal syndrome; Dr Posey Pronto; AVF recommended and scheduled (patient hesitant to proceed); Renal US 11/13: diff echogenic kidneys c/w medical renal disease; no hydronephrosis or renal mass    .  Hyperparathyroidism, secondary renal     .  MGUS (monoclonal gammopathy of unknown significance)       a. Kappa/Lambda free light chain ratio 11/13: 23.99; b. Met. Bone survey 11/13: no osteolytic lesions to suggest MMyeloma    .  Atrial flutter       diagnosed 05/2013; Coumadin initiated     Family History  family history includes Lung cancer in his brother; Pancreatic cancer in his brother; Stomach cancer in his sister. There is no history of Colon cancer.  Prior Rehab/Hospitalizations: none  Current Medications  Current facility-administered medications:acetaminophen (TYLENOL) tablet 650 mg, 650 mg, Oral, Q4H PRN, Amie Portland, MD; amLODipine (NORVASC) tablet 10 mg, 10 mg, Oral, Daily, Rosalin Hawking, MD, 10 mg at 06/14/14 1001; atorvastatin (LIPITOR) tablet 20 mg, 20 mg, Oral, q1800, Rosalin Hawking, MD, 20 mg at 06/13/14 1703; bisacodyl (DULCOLAX) suppository 10 mg, 10 mg, Rectal, Daily PRN, Rosalin Hawking, MD  carvedilol (COREG) tablet 6.25 mg, 6.25 mg, Oral, BID, Rosalin Hawking, MD, 6.25 mg at 06/14/14 1001; furosemide (LASIX) tablet 80 mg, 80 mg, Oral, Daily, Rosalin Hawking, MD, 80 mg at 06/14/14 1001; heparin injection 5,000 Units, 5,000 Units, Subcutaneous, Q12H, Rosalin Hawking, MD, 5,000 Units at 06/14/14 1000; insulin aspart (novoLOG) injection 0-15 Units, 0-15 Units, Subcutaneous, TID WC, Alexis Goodell, MD, 2 Units at 06/14/14 1155  labetalol (NORMODYNE,TRANDATE) injection 10-40 mg, 10-40 mg, Intravenous, Q2H PRN, Rosalin Hawking, MD, 20 mg at 06/14/14 0001; linagliptin (TRADJENTA) tablet 5 mg, 5 mg, Oral, Daily, Rosalin Hawking, MD, 5 mg at 06/14/14 1000; pantoprazole  (PROTONIX) EC tablet 40 mg, 40 mg, Oral, Daily, Rosalin Hawking, MD, 40 mg at 06/14/14 1001; polyethylene glycol (MIRALAX / GLYCOLAX) packet 17 g, 17 g, Oral, Daily, Rosalin Hawking, MD, 17 g at 06/14/14 1000  potassium chloride SA (K-DUR,KLOR-CON) CR tablet 20 mEq, 20 mEq, Oral, BID, Rosalin Hawking, MD, 20 mEq at 06/14/14 1000; senna-docusate (Senokot-S) tablet 1 tablet, 1 tablet, Oral, BID, Amie Portland, MD, 1 tablet at 06/14/14 1001  Patients Current Diet: Carb Control  Precautions / Restrictions  Precautions  Precautions: Fall  Restrictions  Weight Bearing Restrictions: No  Prior Activity Level  Community (5-7x/wk): Pt got out every other day and was driving. He enjoys going to visit friends. Some friends bring meals to him. Pt lives with his son and his son is in/out of the house due to his variable work schedule.  Home Assistive Devices / Equipment  Home Assistive Devices/Equipment: None  Home Equipment: None  Prior Functional Level  Prior Function  Level of Independence: Independent with assistive device(s)  Comments: pt reports he used cane for long distance ambulation as needed  Current Functional Level    Cognition  Arousal/Alertness: Awake/alert  Overall Cognitive Status: Impaired/Different from baseline  Current Attention Level: Selective  Orientation Level: Oriented X4  Following Commands: Follows one step commands inconsistently;Follows one step commands with increased time  Safety/Judgement: Decreased awareness of deficits;Decreased awareness of safety  Attention: Sustained  Sustained Attention: Appears intact  Memory: Appears intact  Awareness: Impaired  Awareness Impairment: Anticipatory impairment  Problem Solving: Impaired  Problem Solving Impairment: Functional basic    Extremity Assessment  (includes Sensation/Coordination)      ADLs  Overall ADL's : Needs assistance/impaired  Eating/Feeding: Set up;Sitting  Eating/Feeding Details (indicate cue type and reason): pt  requires assist to cut food and open containers due to decreased functional use of LUE. Pt able to utilize R, dominant hand to feed self with some difficulty due to positioning challenges.  Grooming: Set up;Sitting  Grooming Details (indicate cue type and reason): sitting EOB with support, able to wipe face with Rt hand.  Upper Body Bathing: Sitting;Moderate assistance  General ADL Comments: Pt sat EOB x 10 minutes for exercises and grooming activities to facilitate trunk control. Pt also performed therapeutic exercises sitting EOB and demonstrated improved static sitting by the end of the session.    Mobility  Overal bed mobility: Needs Assistance  Bed Mobility: Supine to Sit  Supine to sit: Mod assist (HOB flat)  Sit to supine: Mod assist;HOB elevated  General bed mobility comments: scooted via bridging to EOB with min A; truncal assist to come forward.    Transfers  Overall transfer level: Needs assistance  Equipment used: 2 person hand held assist;Ambulation equipment used  Transfers: Sit to/from [employer]  Sit to Stand: Max assist;+2 physical assistance;+2 safety/equipment (times 3 trials)  Squat pivot transfers: Mod assist;+2 physical assistance  General transfer comment: cues for hand placement and safety; significant assist to come forward.    Ambulation / Gait / Stairs / Wheelchair Mobility  Not assessed at this time. Anticipate needs.    Posture / Balance  Dynamic Sitting Balance  Sitting balance - Comments: pt can accept minimal challenge given time to accommodate    Special needs/care consideration  BiPAP/CPAP no  CPM no  Continuous Drip IV no  Dialysis no  Life Vest no  Oxygen no  Special Bed no  Trach Size no  Wound Vac (area) no  Skin - no issues  Bowel mgmt: last BM was prior to admit  Bladder mgmt:currently with condom cath  Diabetic mgmt - yes, managed at home with meds    Previous Home Environment  Living Arrangements: Children  Available Help at  Discharge: Family  Type of Home: House  Home Layout: Two level;Able to live on main level with bedroom/bathroom  Home Access: Level entry  Bathroom Shower/Tub: Administrator, Civil Service: [place]: No  Discharge Living Setting  Plans for Discharge Living Setting: Patient's home  Type of Home at Discharge: House  Discharge Home Layout: Two level;Able to live on main level with bedroom/bathroom  Alternate Level Stairs-Rails: (pt stays downstairs)  Discharge Home Access: Stairs to enter  Entrance Stairs-Number of Steps: 1  Does the patient have any problems obtaining your medications?: No  Social/Family/Support Systems  Patient Roles: (enjoys going to his friend's  houses to visit )  Contact Information: son Rogers Ditter is primary contact  Anticipated Caregiver: son (but note that pt's goals are for Mod Ind except for possible supervision with OT)  Anticipated Caregiver's Contact Information: see above  Ability/Limitations of Caregiver: Son does work odd jobs and has a variable schedule.  Caregiver Availability: Intermittent  Discharge Plan Discussed with Primary Caregiver: Yes  Is Caregiver In Agreement with Plan?: Yes  Does Caregiver/Family have Issues with Lodging/Transportation while Pt is in Rehab?: No  Note: pt was very clear in stating he did not need anyone's support to make decisions or to go home and is very independent at baseline. I explained to the pt that it was my role to clarify available family supports for pt at the end of a rehab stay.  Goals/Additional Needs  Patient/Family Goal for Rehab: Mod Ind with PT/SLP and Mod Ind and supervision with OT  Expected length of stay: 14-18 days  Cultural Considerations: none  Dietary Needs: carb modified, thin liquids  Equipment Needs: to be determined  Pt/Family Agrees to Admission and willing to participate: Yes (spoke with pt's son by phone and met pt's daughter on 06-14-14)  Program Orientation Provided  & Reviewed with Pt/Caregiver Including Roles & Responsibilities: Yes  Decrease burden of Care through IP rehab admission: NA  Possible need for SNF placement upon discharge: not anticipated  Patient Condition: This patient's medical and functional status has changed since the consult dated: 06-12-14 in which the Rehabilitation Physician determined and documented that the patient's condition is appropriate for intensive rehabilitative care in an inpatient rehabilitation facility. See "History of Present Illness" (above) for medical update. Functional changes are: Moderate to maximal assistance x 2 for sit to stand and limited transfers and moderate assistance with limited self care skills. Patient's medical and functional status update has been discussed with the Rehabilitation physician and patient remains appropriate for inpatient rehabilitation. Will admit to inpatient rehab today.  Preadmission Screen Completed By: Nanetta Batty, PT, 06/14/2014 3:14 PM  ______________________________________________________________________  Discussed status with Dr. Naaman Plummer on 06-14-14 at 1514 and received telephone approval for admission today.  Admission Coordinator: Nanetta Batty, PT, time 1514/Date 06-14-14    Cosigned by: Meredith Staggers, MD [06/14/2014 3:51 PM]

## 2014-06-17 NOTE — Progress Notes (Signed)
Parker Staggers, MD Physician Signed Physical Medicine and Rehabilitation Consult Note Service date: 06/12/2014 1:47 PM  Related encounter: Admission (Discharged) from 06/11/2014 in [place]           Physical Medicine and Rehabilitation Consult Reason for Consult: Right pontomedullary hematoma Referring Physician: Dr.Xu     HPI: [name] is a 80 y.o. right-handed male with history of CAD status post CABG, atrial flutter on chronic Coumadin, chronic renal insufficiency with baseline creatinine 5.36, chronic diastolic congestive heart failure. Independent with occasional cane and driving living with family. Admitted 06/11/2049 of left-sided weakness and slurred speech as well as recent fall but denies striking his head. Cranial CT scan shows a right pontomedullary 14 x 22 mm oval focus consistent with acute hemorrhage. INR on admission of 1.44 and systolic blood pressure 315. Placed on intravenous Lopressor and Coumadin was reversed. Followup cranial CT scan unchanged. 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 Tolerating a regular consistency diet. Physical therapy evaluation completed 06/12/2014 with recommendations of physical medicine and rehabilitation consult.     Review of Systems  Cardiovascular: Positive for palpitations.  Gastrointestinal:        GERD  Musculoskeletal: Positive for myalgias.  Neurological: Positive for speech change and weakness.  Past Medical History   Diagnosis  Date   .  CAD (coronary artery disease)         s/p 3v CABG 2006, myoview 04/2010 EF 49%, prior inferior/apical infarct, no ischemia, LHC 11/2011 stable anatomy (occluded LAD filled from vein graft, distal LCx occluded prior to OM2, OM2 filled from vein graft, RCA prox 25%, mid 40%, distal 25% lesions, SVG to diagonal occluded) Med Rx   .  Chronic diastolic heart failure         hx of cardiorenal syndrome;  Echo 06/2012 EF  40-08%, mod diastolic dysfunction, mild MR, mod TR, mild R/LAE, mild RV dilatation, PASP 66mmHg.   .  Carotid stenosis         a. Carotid dopplers R 40-59%, Left 60-79%;   b. carotids 12/13:  6-76% RICA, 19-50% LICA (rpt in 6 mos);  c.  Carotid US (11/14):  R 40-59%; L 60-79%, R vertebral occluded, L vertebral antegrade   .  HLD (hyperlipidemia)     .  Mediastinal adenopathy         per CT chest in 2006 with PET scan showing very limited metabolic activity; not felt to have a significant neoplastic potential   .  Iron deficiency anemia     .  Hypertension     .  Diabetes mellitus         insulin dependent    .  GERD (gastroesophageal reflux disease)     .  AV block, Mobitz 1         Noted 11/2011 in hospital, BB stopped   .  Colon polyps     .  Diverticulosis     .  Pulmonary arterial hypertension         a.  RHC 11/1 PASP 60 mmHg (mean 38); b. RHC 11/15 PA pressure 66/24, PCWP 26 and CO 6.2 => Sildenafil started;   c. Echo bubble study 11/13:  no obvious shunt;   d. VQ scan neg for pulmonary embolism   .  CKD (chronic kidney disease) stage 5, GFR less than 15 ml/min         hx of cardiorenal syndrome;  Dr Posey Pronto;  AVF  recommended and scheduled (patient hesitant to proceed);   Renal US 11/13: diff echogenic kidneys c/w medical renal disease; no hydronephrosis or renal mass   .  Hyperparathyroidism, secondary renal     .  MGUS (monoclonal gammopathy of unknown significance)         a. Kappa/Lambda free light chain ratio 11/13:  23.99;  b. Met. Bone survey 11/13:  no osteolytic lesions to suggest MMyeloma   .  Atrial flutter         diagnosed 05/2013; Coumadin initiated    Past Surgical History   Procedure  Laterality  Date   .  Appendectomy       .  Coronary artery bypass graft    2006       SVG to OM2, SVG to LAD, SVG to DX; (the LIMA did not have good flow and therefore was not used)   .  Cardiac catheterization    12/21/2011    Family History   Problem  Relation  Age of Onset   .   Stomach cancer  Sister     .  Pancreatic cancer  Brother     .  Lung cancer  Brother     .  Colon cancer  Neg Hx      Social History: reports that he quit smoking about 60 years ago. He has never used smokeless tobacco. He reports that he does not drink alcohol or use illicit drugs. Allergies: No Known Allergies Medications Prior to Admission   Medication  Sig  Dispense  Refill   .  ADVOCATE LANCETS MISC           .  Alcohol Swabs (ALCOHOL PREP) 70 % PADS           .  amLODipine (NORVASC) 10 MG tablet  Take 10 mg by mouth daily.         Marland Kitchen  atorvastatin (LIPITOR) 20 MG tablet  take 1 tablet by mouth at bedtime   30 tablet   3   .  Blood Glucose Calibration (OT ULTRA/FASTTK CNTRL SOLN) SOLN           .  carvedilol (COREG) 6.25 MG tablet  Take 1 tablet (6.25 mg total) by mouth 2 (two) times daily.   180 tablet   3   .  furosemide (LASIX) 80 MG tablet  Take 80 mg by mouth daily.         .  insulin detemir (LEVEMIR) 100 UNIT/ML injection  Inject 0.15 mLs (15 Units total) into the skin at bedtime.   10 mL   2   .  Insulin Pen Needle (NOVOFINE) 30G X 8 MM MISC  Use as directed once daily with insulin DUE FOR LABS   30 each   0   .  Lancet Devices (ADVOCATE RAPID-SAFE LANCING) MISC           .  ONE TOUCH ULTRA TEST test strip           .  potassium chloride SA (K-DUR,KLOR-CON) 20 MEQ tablet  Take 20 mEq by mouth 2 (two) times daily.         .  saxagliptin HCl (ONGLYZA) 2.5 MG TABS tablet  Take 1 tablet (2.5 mg total) by mouth daily.   30 tablet   11   .  Vitamin D, Ergocalciferol, (DRISDOL) 50000 UNITS CAPS capsule  Take 50,000 Units by mouth every 7 (seven) days.         Marland Kitchen  warfarin (COUMADIN) 6 MG tablet  Take 1 tablet (6 mg total) by mouth as directed.   40 tablet   3      Home: Home Living Family/patient expects to be discharged to:: Private residence Living Arrangements: Children Available Help at Discharge: Family Type of Home: House Home Access: Level entry Home Layout: Two  level;Able to live on main level with bedroom/bathroom Home Equipment: None   Functional History: Prior Function Level of Independence: Independent Functional Status:   Mobility: Bed Mobility Overal bed mobility: Needs Assistance Bed Mobility: Supine to Sit;Sit to Supine Supine to sit: Mod assist (with verbal cues) Sit to supine: Mod assist (verbal cueing) General bed mobility comments: Pt demonstrates diffifulty with sequencing of task and requires mulitmodal cueing and physical assistance for supine to sit. Pt requires less cueing for return to supine.    ADL:   Cognition: Cognition Overall Cognitive Status: No family/caregiver present to determine baseline cognitive functioning Arousal/Alertness: Awake/alert Orientation Level: Oriented to person;Oriented to place;Oriented to situation;Disoriented to time (2013) Attention: Sustained Sustained Attention: Appears intact Memory: Appears intact Awareness: Impaired Awareness Impairment: Anticipatory impairment Problem Solving: Impaired Problem Solving Impairment: Functional basic Cognition Arousal/Alertness: Awake/alert Behavior During Therapy: WFL for tasks assessed/performed Overall Cognitive Status: No family/caregiver present to determine baseline cognitive functioning   Blood pressure 178/71, pulse 65, temperature 98 F (36.7 C), temperature source Oral, resp. rate 20, height $RemoveBe'5\' 11"'hoSIhUOzt$  (1.803 m), weight 81.149 kg (178 lb 14.4 oz), SpO2 100.00%. Physical Exam  HENT:   Head: Normocephalic.  Eyes: EOM are normal.  Neck: Normal range of motion. Neck supple. No thyromegaly present.  Cardiovascular:  Cardiac rate control  Respiratory: Effort normal and breath sounds normal. No respiratory distress.  GI: Soft. Bowel sounds are normal. He exhibits no distension.  Neurological: He is alert.  Speech is dysarthric but intelligible. He makes good eye contact with examiner. He was able to provide his age and date of birth. Follows  simple commands. Left sided weakness noted.   Skin: Skin is warm and dry.  Psychiatric: He has a normal mood and affect.     Results for orders placed during the hospital encounter of 06/11/14 (from the past 24 hour(s))   PROTIME-INR     Status: Abnormal     Collection Time      06/11/14  2:07 PM       Result  Value  Ref Range     Prothrombin Time  23.6 (*)  11.6 - 15.2 seconds     INR  2.10 (*)  0.00 - 1.49   APTT     Status: None     Collection Time      06/11/14  2:07 PM       Result  Value  Ref Range     aPTT  36   24 - 37 seconds   CBC     Status: Abnormal     Collection Time      06/11/14  2:07 PM       Result  Value  Ref Range     WBC  4.2   4.0 - 10.5 K/uL     RBC  4.16 (*)  4.22 - 5.81 MIL/uL     Hemoglobin  11.8 (*)  13.0 - 17.0 g/dL     HCT  35.9 (*)  39.0 - 52.0 %     MCV  86.3   78.0 - 100.0 fL     MCH  28.4   26.0 -  34.0 pg     MCHC  32.9   30.0 - 36.0 g/dL     RDW  17.3 (*)  11.5 - 15.5 %     Platelets  112 (*)  150 - 400 K/uL   DIFFERENTIAL     Status: None     Collection Time      06/11/14  2:07 PM       Result  Value  Ref Range     Neutrophils Relative %  68   43 - 77 %     Neutro Abs  2.9   1.7 - 7.7 K/uL     Lymphocytes Relative  26   12 - 46 %     Lymphs Abs  1.1   0.7 - 4.0 K/uL     Monocytes Relative  6   3 - 12 %     Monocytes Absolute  0.2   0.1 - 1.0 K/uL     Eosinophils Relative  1   0 - 5 %     Eosinophils Absolute  0.0   0.0 - 0.7 K/uL     Basophils Relative  0   0 - 1 %     Basophils Absolute  0.0   0.0 - 0.1 K/uL   COMPREHENSIVE METABOLIC PANEL     Status: Abnormal     Collection Time      06/11/14  2:07 PM       Result  Value  Ref Range     Sodium  142   137 - 147 mEq/L     Potassium  4.0   3.7 - 5.3 mEq/L     Chloride  106   96 - 112 mEq/L     CO2  21   19 - 32 mEq/L     Glucose, Bld  147 (*)  70 - 99 mg/dL     BUN  35 (*)  6 - 23 mg/dL     Creatinine, Ser  2.47 (*)  0.50 - 1.35 mg/dL     Calcium  9.9   8.4 - 10.5 mg/dL     Total  Protein  8.2   6.0 - 8.3 g/dL     Albumin  3.9   3.5 - 5.2 g/dL     AST  20   0 - 37 U/L     ALT  11   0 - 53 U/L     Alkaline Phosphatase  103   39 - 117 U/L     Total Bilirubin  0.7   0.3 - 1.2 mg/dL     GFR calc non Af Amer  23 (*)  >90 mL/min     GFR calc Af Amer  27 (*)  >90 mL/min     Anion gap  15   5 - 15   HEMOGLOBIN A1C     Status: Abnormal     Collection Time      06/11/14  2:07 PM       Result  Value  Ref Range     Hemoglobin A1C  6.7 (*)  <5.7 %     Mean Plasma Glucose  146 (*)  <117 mg/dL   I-STAT TROPOININ, ED     Status: None     Collection Time      06/11/14  3:05 PM       Result  Value  Ref Range     Troponin i, poc  0.02   0.00 - 0.08  ng/mL     Comment 3              CBG MONITORING, ED     Status: Abnormal     Collection Time      06/11/14  3:41 PM       Result  Value  Ref Range     Glucose-Capillary  168 (*)  70 - 99 mg/dL     Comment 1  Documented in Chart        Comment 2  Notify RN      PREPARE FRESH FROZEN PLASMA     Status: None     Collection Time      06/11/14  7:00 PM       Result  Value  Ref Range     Unit Number  [id number]        Blood Component Type  THAWED PLASMA        Unit division  00        Status of Unit  ISSUED,FINAL        Transfusion Status  OK TO TRANSFUSE        Unit Number  [id number]        Blood Component Type  THAWED PLASMA        Unit division  00        Status of Unit  ISSUED,FINAL        Transfusion Status  OK TO TRANSFUSE        Unit Number  [id number]        Blood Component Type  THAWED PLASMA        Unit division  00        Status of Unit  ISSUED,FINAL        Transfusion Status  OK TO TRANSFUSE        Unit Number  [id number]        Blood Component Type  THAWED PLASMA        Unit division  00        Status of Unit  ISSUED        Transfusion Status  OK TO TRANSFUSE      MRSA PCR SCREENING     Status: None     Collection Time      06/11/14  9:31 PM       Result  Value  Ref Range     MRSA by PCR   NEGATIVE   NEGATIVE   GLUCOSE, CAPILLARY     Status: Abnormal     Collection Time      06/12/14  7:47 AM       Result  Value  Ref Range     Glucose-Capillary  153 (*)  70 - 99 mg/dL     Comment 1  Notify RN        Comment 2  Documented in Chart      CBC     Status: Abnormal     Collection Time      06/12/14  9:10 AM       Result  Value  Ref Range     WBC  3.8 (*)  4.0 - 10.5 K/uL     RBC  3.86 (*)  4.22 - 5.81 MIL/uL     Hemoglobin  10.7 (*)  13.0 - 17.0 g/dL     HCT  32.4 (*)  39.0 - 52.0 %     MCV  83.9   78.0 - 100.0 fL  MCH  27.7   26.0 - 34.0 pg     MCHC  33.0   30.0 - 36.0 g/dL     RDW  17.3 (*)  11.5 - 15.5 %     Platelets  80 (*)  150 - 400 K/uL   BASIC METABOLIC PANEL     Status: Abnormal     Collection Time      06/12/14  9:10 AM       Result  Value  Ref Range     Sodium  143   137 - 147 mEq/L     Potassium  3.8   3.7 - 5.3 mEq/L     Chloride  107   96 - 112 mEq/L     CO2  23   19 - 32 mEq/L     Glucose, Bld  137 (*)  70 - 99 mg/dL     BUN  34 (*)  6 - 23 mg/dL     Creatinine, Ser  2.52 (*)  0.50 - 1.35 mg/dL     Calcium  10.1   8.4 - 10.5 mg/dL     GFR calc non Af Amer  22 (*)  >90 mL/min     GFR calc Af Amer  26 (*)  >90 mL/min     Anion gap  13   5 - 15   PROTIME-INR     Status: Abnormal     Collection Time      06/12/14  9:10 AM       Result  Value  Ref Range     Prothrombin Time  20.0 (*)  11.6 - 15.2 seconds     INR  1.70 (*)  0.00 - 1.49   GLUCOSE, CAPILLARY     Status: Abnormal     Collection Time      06/12/14 11:28 AM       Result  Value  Ref Range     Glucose-Capillary  154 (*)  70 - 99 mg/dL     Comment 1  Notify RN        Comment 2  Documented in Chart       Ct Head Wo Contrast   06/12/2014   CLINICAL DATA:  Reassess known intracranial hemorrhage  EXAM: CT HEAD WITHOUT CONTRAST  TECHNIQUE: Contiguous axial images were obtained from the base of the skull through the vertex without intravenous contrast.  COMPARISON:  Noncontrast CT scan of  the brain dated 11 June 2014  FINDINGS: Again demonstrated is a hyperdense focus in the right anterior and mid pons. It measures 1.7 cm transversely x 0.9 cm AP and is slightly smaller overall in appearance since yesterday's study. There is no subarachnoid blood. There is no hemorrhagic focus elsewhere within the brain.  There is mild stable diffuse cerebral and cerebellar atrophy with compensatory ventriculomegaly. Stable decreased density in the deep white matter of both cerebral hemispheres is consistent with chronic small vessel ischemic change. Mild encephalomalacia in the posterior parietal deep white matter and adjacent cortex is stable. The cerebellum exhibits no acute abnormality.  The calvarium is intact. The observed paranasal sinuses and mastoid air cells are clear.  IMPRESSION: 1. The known hemorrhagic focus within the anterior and right aspect of the pons has not increased in size and is in fact slightly less conspicuous today. No intracranial hemorrhage otherwise is demonstrated. 2. There are stable changes of chronic small vessel ischemia and diffuse atrophy.   Electronically Signed   By: Shanon Brow  [place]   On: 06/12/2014 08:39    Ct Head (brain) Wo Contrast   06/11/2014   CLINICAL DATA:  Stroke like symptoms trauma slurred speech and leg weakness  EXAM: CT HEAD WITHOUT CONTRAST  TECHNIQUE: Contiguous axial images were obtained from the base of the skull through the vertex without intravenous contrast.  COMPARISON:  06/28/2013, 06/30/2013  FINDINGS: There is severe diffuse atrophy and low attenuation in the deep white matter diffusely. This is unchanged from the prior study. There is proportional dilatation of the ventricles. There is no evidence of abnormality involving the calvarium and there is no significant inflammatory change in the sinuses.  In the right pontomedullary junction there is a 14 x 22 mm oval focus of hyperattenuation that was not present previously. There is no extra-axial  hemorrhage. There is no evidence of vascular territory ischemic infarct or mass. Atherosclerotic calcification of the intracranial vessels is again noted.  IMPRESSION: Superimposed on chronic hypertensive involutional change, there is a 22 x 14 mm hyper attenuating focus in the right pontomedullary junction most consistent with acute hemorrhage. Critical Value/emergent results were called by telephone at the time of interpretation on 06/11/2014 at 5:22 pm to Dr. Ezequiel Essex , who verbally acknowledged these results.   Electronically Signed   By: Skipper Cliche M.D.   On: 06/11/2014 17:22     Assessment/Plan: Diagnosis: right ponto-medullary infarct Does the need for close, 24 hr/day medical supervision in concert with the patient's rehab needs make it unreasonable for this patient to be served in a less intensive setting? Yes Co-Morbidities requiring supervision/potential complications: oa, cad Due to bladder management, bowel management, safety, skin/wound care, disease management, medication administration, pain management and patient education, does the patient require 24 hr/day rehab nursing? Yes Does the patient require coordinated care of a physician, rehab nurse, PT (1-2 hrs/day, 5 days/week), OT (1-2 hrs/day, 5 days/week) and SLP (1-2 hrs/day, 5 days/week) to address physical and functional deficits in the context of the above medical diagnosis(es)? Yes Addressing deficits in the following areas: balance, endurance, locomotion, strength, transferring, bowel/bladder control, bathing, dressing, feeding, grooming and toileting Can the patient actively participate in an intensive therapy program of at least 3 hrs of therapy per day at least 5 days per week? Yes The potential for patient to make measurable gains while on inpatient rehab is excellent Anticipated functional outcomes upon discharge from inpatient rehab are modified independent  with PT, modified independent and supervision with OT,  modified independent with SLP. Estimated rehab length of stay to reach the above functional goals is: 14-18 days Does the patient have adequate social supports to accommodate these discharge functional goals? Yes Anticipated D/C setting: Home Anticipated post D/C treatments: HH therapy and Outpatient therapy Overall Rehab/Functional Prognosis: excellent   RECOMMENDATIONS: This patient's condition is appropriate for continued rehabilitative care in the following setting: CIR Patient has agreed to participate in recommended program. Potentially Note that insurance prior authorization may be required for reimbursement for recommended care.   Comment: Rehab Admissions Coordinator to follow up.   Thanks,   Parker Staggers, MD, Mellody Drown         06/12/2014    Revision History...     Date/Time User Action   06/12/2014 3:24 PM Parker Staggers, MD Sign   06/12/2014 2:08 PM Lavon Paganini [place], PA-C Pend  View Details Report   Routing History...     Date/Time From To Method   06/12/2014 3:24 PM Parker Staggers, MD Parker Staggers,  MD In [employer]

## 2014-06-18 ENCOUNTER — Inpatient Hospital Stay (HOSPITAL_COMMUNITY): Payer: Medicare Other | Admitting: Speech Pathology

## 2014-06-18 ENCOUNTER — Inpatient Hospital Stay (HOSPITAL_COMMUNITY): Payer: Medicare HMO | Admitting: Physical Therapy

## 2014-06-18 ENCOUNTER — Inpatient Hospital Stay (HOSPITAL_COMMUNITY): Payer: Medicare HMO | Admitting: Occupational Therapy

## 2014-06-18 LAB — GLUCOSE, CAPILLARY
Glucose-Capillary: 122 mg/dL — ABNORMAL HIGH (ref 70–99)
Glucose-Capillary: 142 mg/dL — ABNORMAL HIGH (ref 70–99)
Glucose-Capillary: 163 mg/dL — ABNORMAL HIGH (ref 70–99)
Glucose-Capillary: 133 mg/dL — ABNORMAL HIGH (ref 70–99)

## 2014-06-18 NOTE — Progress Notes (Signed)
Occupational Therapy Session Note  Patient Details  Name: [name] MRN: [id number] Date of Birth: 09/28/1930  Today's Date: 06/18/2014 OT Co-Treatment Time: 1300-1330 (cotx with PT- total time 1300-140) OT Co-Treatment Time Calculation (min): 30 min   Short Term Goals: Week 1:  OT Short Term Goal 1 (Week 1): Pt will perform toilet transfer with max A OT Short Term Goal 2 (Week 1): Pt will engage in 10 minutes of functional activity before requiring rest break in order to increase endurance. OT Short Term Goal 3 (Week 1): Pt maintain dynamic sitting balance with supervision for 5 minutes during activity. OT Short Term Goal 4 (Week 1): Pt will perform LB dressing with Max A.   Skilled Therapeutic Interventions/Progress Updates:    Cotx with PT to focus on postural control, static and dynamic sitting balance, transfers, and sit<>stand.  Pt exhibits increase posterior lean with all transitional movements requiring max facilitation and tactile cues to initiate forward lean.  Pt is able to reach forward and to right/left when sitting EOM but exhibits trunk extension with all transitional movements.  Attempted sit<>stand requiring tot A + 2 but pt unable to initiate trunk extension and exhibited forward flexion at hips.  Bobath transfers utilized with w/c<>therapy mat.  Therapy Documentation Precautions:  Precautions Precautions: Fall Restrictions Weight Bearing Restrictions: No   Pain: Pain Assessment Pain Assessment: No/denies pain  See FIM for current functional status  Therapy/Group: Co-Treatment  Leroy Libman 06/18/2014, 2:01 PM

## 2014-06-18 NOTE — Progress Notes (Signed)
Social Work Assessment and Plan  Patient Details  Name: [name] MRN: [id number] Date of Birth: Oct 09, 1930  Today's Date: 06/18/2014  Problem List:  Patient Active Problem List   Diagnosis Date Noted  . ICH (intracerebral hemorrhage) 06/14/2014  . Stroke due to intracerebral hemorrhage 06/11/2014  . Atrial fibrillation 11/20/2013  . Chronic diastolic congestive heart failure 11/20/2013  . Atrial flutter 06/25/2013  . Long term (current) use of anticoagulants 06/25/2013  . CKD (chronic kidney disease) stage 5, GFR less than 15 ml/min 08/29/2012  . Pulmonary arterial hypertension 07/29/2012  . Anemia, iron deficiency 01/25/2012  . AV block, Mobitz 1 01/10/2012  . CARDIOMYOPATHY, ISCHEMIC 10/22/2010  . HEADACHE 01/23/2009  . DIABETIC PERIPHERAL NEUROPATHY 07/24/2008  . ABDOMINAL PAIN, RIGHT UPPER QUADRANT 10/23/2007  . DIABETES MELLITUS 06/24/2007  . HYPERLIPIDEMIA 06/24/2007  . CORONARY ARTERY DISEASE 06/24/2007  . OSTEOARTHRITIS 06/24/2007  . CEREBROVASCULAR DISEASE, HX OF 06/24/2007  . STATUS, OTHER TOE(S) AMPUTATION 06/24/2007  . HYPERTENSION 05/13/2007  . CORONARY ARTERY BYPASS GRAFT, HX OF 11/25/1994   Past Medical History:  Past Medical History  Diagnosis Date  . CAD (coronary artery disease)     s/p 3v CABG 2006, myoview 04/2010 EF 49%, prior inferior/apical infarct, no ischemia, LHC 11/2011 stable anatomy (occluded LAD filled from vein graft, distal LCx occluded prior to OM2, OM2 filled from vein graft, RCA prox 25%, mid 40%, distal 25% lesions, SVG to diagonal occluded) Med Rx  . Chronic diastolic heart failure     hx of cardiorenal syndrome;  Echo 06/2012 EF 73-42%, mod diastolic dysfunction, mild MR, mod TR, mild R/LAE, mild RV dilatation, PASP 70mHg.  . Carotid stenosis     a. Carotid dopplers R 40-59%, Left 60-79%;   b. carotids 12/13:  08-76%RICA, 681-15%LICA (rpt in 6 mos);  c.  Carotid [place](11/14):  R 40-59%; L 60-79%, R vertebral occluded, L vertebral  antegrade  . HLD (hyperlipidemia)   . Mediastinal adenopathy     per CT chest in 2006 with PET scan showing very limited metabolic activity; not felt to have a significant neoplastic potential  . Iron deficiency anemia   . Hypertension   . Diabetes mellitus     insulin dependent   . GERD (gastroesophageal reflux disease)   . AV block, Mobitz 1     Noted 11/2011 in hospital, BB stopped  . Colon polyps   . Diverticulosis   . Pulmonary arterial hypertension     a.  RHC 11/1 PASP 60 mmHg (mean 38); b. RHC 11/15 PA pressure 66/24, PCWP 26 and CO 6.2 => Sildenafil started;   c. Echo bubble study 11/13:  no obvious shunt;   d. VQ scan neg for pulmonary embolism  . CKD (chronic kidney disease) stage 5, GFR less than 15 ml/min     hx of cardiorenal syndrome;  Dr PPosey Pronto  AVF recommended and scheduled (patient hesitant to proceed);   Renal [place]11/13: diff echogenic kidneys c/w medical renal disease; no hydronephrosis or renal mass  . Hyperparathyroidism, secondary renal   . MGUS (monoclonal gammopathy of unknown significance)     a. Kappa/Lambda free light chain ratio 11/13:  23.99;  b. Met. Bone survey 11/13:  no osteolytic lesions to suggest MMyeloma  . Atrial flutter     diagnosed 05/2013; Coumadin initiated   Past Surgical History:  Past Surgical History  Procedure Laterality Date  . Appendectomy    . Coronary artery bypass graft  2006    SVG  to OM2, SVG to LAD, SVG to DX; (the LIMA did not have good flow and therefore was not used)  . Cardiac catheterization  12/21/2011   Social History:  reports that he quit smoking about 60 years ago. He has never used smokeless tobacco. He reports that he does not drink alcohol or use illicit drugs.  Family / Support Systems Marital Status: Widow/Widower How Long?: about 10 years Patient Roles: Parent Children: Marsha Hillman - son [phone]     Lysle Morales -dtr  [phone] Anticipated Caregiver: Son with other family members to assist when son  is working.  He is out of the home on average 3 days a week. Ability/Limitations of Caregiver: Son does and has a variable schedule.  Caregiver Availability: Intermittent (from son.  Family is working on Dance movement psychotherapist together 24/7 care.) Family Dynamics: Son seemed concerned for pt over the phone.   Social History Preferred language: English Religion: None Education: 9th grade Read: Yes Write: Yes Employment Status: Retired Date Retired/Disabled/Unemployed: 2005 Public relations account executive Issues: None reported Guardian/Conservator: N/A   Abuse/Neglect Physical Abuse: Denies Verbal Abuse: Denies Sexual Abuse: Denies Exploitation of patient/patient's resources: Denies Self-Neglect: Denies  Emotional Status Pt's affect, behavior and adjustment status: Pt reports feeling okay emotionally, but is very concerned about only going back to HIS home at d/c. Recent Psychosocial Issues: None reported Psychiatric History: None reported Substance Abuse History: None reported  Patient / Family Perceptions, Expectations & Goals Pt/Family understanding of illness & functional limitations: Pt reported understanding his medical condition. Premorbid pt/family roles/activities: Pt enjoys spending time with family and friends.   Anticipated changes in roles/activities/participation: Pt will not be able to drive for a while and will need assistance with his home. Pt/family expectations/goals: Pt states "I want to go to my home."  [employer]: None Premorbid Home Care/DME Agencies: None Transportation available at discharge: family Resource referrals recommended: Neuropsychology;Psychology;Support group (specify)  Discharge Planning Living Arrangements: Children Support Systems: Children;Other (Comment) (male companion) Type of Residence: Private residence Insurance Resources: Multimedia programmer (specify) Scientist, clinical (histocompatibility and immunogenetics) Medicare) Financial Resources: [place] Referred: No Money Management: Patient Does the patient have any problems obtaining your medications?: No Home Management: Pt's family can assist. Patient/Family Preliminary Plans: Pt/children are trying to arrange 24/7 care for pt at home. Barriers to Discharge: Steps;Family Support Social Work Anticipated Follow Up Needs: HH/OP;Support Group (stroke support group) Expected length of stay: 3-4 weeks  Clinical Impression CSW met with pt to introduce self and role of CSW, as well as complete assessment.  Pt was very talkative and could mostly be understood, but at times some words were difficult for CSW to understand.  CSW later spoke with pt's son, Pilar Plate, to introduce self.  Asked if he could bring pt some clothes and shoes and he stated he would.  He works on average 3 days/week.  CSW suggested to him that the family begin working on who would be with pt on the days when he is not home.  Son said they would discuss, but wanted CSW to talk with his sister, also.  CSW will relay this info on to sister when we talk.  Pt is adamant about returning to HIS home and to nobody else's home.  At this time, it is predicted that pt will need minimal assistance 24/7, so we will need to have a firm plan for care at pt's home in place.  CSW will continue to follow and assist as needed.  Augustina Braddock, [employer]  06/18/2014, 12:17 PM

## 2014-06-18 NOTE — Progress Notes (Signed)
[facility] Individual Statement of Services  Patient Name:  [name]  Date:  06/18/2014  Welcome to the [facility].  Our goal is to provide you with an individualized program based on your diagnosis and situation, designed to meet your specific needs.  With this comprehensive rehabilitation program, you will be expected to participate in at least 3 hours of rehabilitation therapies Monday-Friday, with modified therapy programming on the weekends.  Your rehabilitation program will include the following services:  Physical Therapy (PT), Occupational Therapy (OT), Speech Therapy (ST), 24 hour per day rehabilitation nursing, Case Management (Social Worker), Rehabilitation Medicine, Nutrition Services and Pharmacy Services  Weekly team conferences will be held on Tuesdays to discuss your progress.  Your Social Worker will talk with you frequently to get your input and to update you on team discussions.  Team conferences with you and your family in attendance may also be held.  Expected length of stay:  3 to 4 weeks  Overall anticipated outcome:   Minimal Assistance  Depending on your progress and recovery, your program may change. Your Social Worker will coordinate services and will keep you informed of any changes. Your Social Worker's name and contact numbers are listed  below.  The following services may also be recommended but are not provided by the [facility] will be made to provide these services after discharge if needed.  Arrangements include referral to agencies that provide these services.  Your insurance has been verified to be:  [employer] Your primary doctor is:  Dr. Vertell Novak  Pertinent information will be shared with your doctor and your insurance company.  Social Worker:  Alfonse Alpers, LCSW  [phone] or ([phone]  Information discussed with and copy given to patient by: Trey Sailors, 06/18/2014, 11:52 AM

## 2014-06-18 NOTE — Progress Notes (Signed)
Physical Therapy Session Note  Patient Details  Name: [name] MRN: [id number] Date of Birth: [birth year]  Today's Date: 06/18/2014 PT Co-Treatment Time: 1330-1400 (Co-tx with TL (OT); entire session from 1300-1400) PT Co-Treatment Time Calculation (min): 30 min  Short Term Goals: Week 1:  PT Short Term Goal 1 (Week 1): Pt will demonstrate rolling L in bed req min A PT Short Term Goal 2 (Week 1): Pt will demonstrate supine to sit req mod A PT Short Term Goal 3 (Week 1): Pt will demonstrate stand-pivot transfer bed to w/c req mod A.  PT Short Term Goal 4 (Week 1): Pt will self propel manual w/c x 50' req min A PT Short Term Goal 5 (Week 1): Pt will tolerate ambulation with 2 person assist.   Skilled Therapeutic Interventions/Progress Updates:    2:1. Skilled co-treatment with OT focusing on active performance of anterior weight shift to facilitate independence with functional transfers. Pt received seated in w/c; agreed to therapy with min coaxing. Transported pt in w/c to treatment gym with total A for energy conservation. Performed lateral scooting transfer from w/c>mat table with +2Total A (Bobath technique), pt performing <25%; manual facilitation provided for full anterior weight shift. NMR interventions focused on transitional movements to promote motor control, postural stability. Pt performed blocked practice of RUE anterolateral reaching ( with LUE weightbearing for activation). Transitioned to partial then full sit<>stand transfers without UE support to avoid RUE pushing; tactile cueing ribcage bilaterally to emphasize erect trunk flexion. Performed squat pivot transfer from bed>w/c with +2A for safety (Bobath technique), pt performing >50% of transfer. Session ended in pt room, where pt was left seated in w/c with quick release belt in place for safety and all needs within reach.  Therapy Documentation Precautions:  Precautions Precautions: Fall Restrictions Weight Bearing  Restrictions: No Pain:  Pt denies pain.  See FIM for current functional status  Therapy/Group: Co-Treatment  Marjoria Mancillas, Malva Cogan 06/18/2014, 6:51 PM

## 2014-06-18 NOTE — Progress Notes (Signed)
Subjective/Complaints: 81 y.o. right-handed male with history diabetes mellitus with peripheral neuropathy, CAD status post CABG, atrial flutter on chronic Coumadin, chronic renal insufficiency with baseline creatinine 6.73, chronic diastolic congestive heart failure. Independent with occasional cane and driving living with family. Admitted 06/11/2049 of left-sided weakness and slurred speech as well as recent fall but denies striking his head. Cranial CT scan shows a right pontomedullary 14 x 22 mm oval focus consistent with acute hemorrhage. INR on admission of 4.19 and systolic blood pressure 379. Placed on intravenous Lopressor and Coumadin was reversed. Followup cranial CT scan unchanged. 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 Tolerating a regular consistency diet. Coumadin remains on hold with cardiac rate control.   Had a fair night denies issues in therapy yesterday  Review of Systems - Negative except weak on left  Objective: Vital Signs: Blood pressure 140/62, pulse 58, temperature 98.3 F (36.8 C), temperature source Oral, resp. rate 18, SpO2 98.00%. No results found. Results for orders placed during the hospital encounter of 06/14/14 (from the past 72 hour(s))  GLUCOSE, CAPILLARY     Status: Abnormal   Collection Time    06/15/14 11:49 AM      Result Value Ref Range   Glucose-Capillary 165 (*) 70 - 99 mg/dL  GLUCOSE, CAPILLARY     Status: Abnormal   Collection Time    06/15/14  4:25 PM      Result Value Ref Range   Glucose-Capillary 167 (*) 70 - 99 mg/dL   Comment 1 Notify RN    GLUCOSE, CAPILLARY     Status: None   Collection Time    06/15/14  9:19 PM      Result Value Ref Range   Glucose-Capillary 94  70 - 99 mg/dL  GLUCOSE, CAPILLARY     Status: None   Collection Time    06/16/14  6:35 AM      Result Value Ref Range   Glucose-Capillary 83  70 - 99 mg/dL  GLUCOSE, CAPILLARY     Status: Abnormal   Collection Time    06/16/14 11:59  AM      Result Value Ref Range   Glucose-Capillary 111 (*) 70 - 99 mg/dL   Comment 1 Notify RN    GLUCOSE, CAPILLARY     Status: Abnormal   Collection Time    06/16/14  4:38 PM      Result Value Ref Range   Glucose-Capillary 149 (*) 70 - 99 mg/dL   Comment 1 Notify RN    GLUCOSE, CAPILLARY     Status: Abnormal   Collection Time    06/16/14  9:52 PM      Result Value Ref Range   Glucose-Capillary 163 (*) 70 - 99 mg/dL  GLUCOSE, CAPILLARY     Status: Abnormal   Collection Time    06/17/14  7:23 AM      Result Value Ref Range   Glucose-Capillary 110 (*) 70 - 99 mg/dL   Comment 1 Notify RN    GLUCOSE, CAPILLARY     Status: Abnormal   Collection Time    06/17/14 11:28 AM      Result Value Ref Range   Glucose-Capillary 151 (*) 70 - 99 mg/dL   Comment 1 Notify RN    CBC WITH DIFFERENTIAL     Status: Abnormal   Collection Time    06/17/14 12:10 PM      Result Value Ref Range   WBC 4.9  4.0 -  10.5 K/uL   RBC 3.80 (*) 4.22 - 5.81 MIL/uL   Hemoglobin 10.7 (*) 13.0 - 17.0 g/dL   HCT 32.0 (*) 39.0 - 52.0 %   MCV 84.2  78.0 - 100.0 fL   MCH 28.2  26.0 - 34.0 pg   MCHC 33.4  30.0 - 36.0 g/dL   RDW 17.0 (*) 11.5 - 15.5 %   Platelets 97 (*) 150 - 400 K/uL   Comment: REPEATED TO VERIFY     CONSISTENT WITH PREVIOUS RESULT   Neutrophils Relative % 73  43 - 77 %   Neutro Abs 3.6  1.7 - 7.7 K/uL   Lymphocytes Relative 19  12 - 46 %   Lymphs Abs 0.9  0.7 - 4.0 K/uL   Monocytes Relative 8  3 - 12 %   Monocytes Absolute 0.4  0.1 - 1.0 K/uL   Eosinophils Relative 0  0 - 5 %   Eosinophils Absolute 0.0  0.0 - 0.7 K/uL   Basophils Relative 0  0 - 1 %   Basophils Absolute 0.0  0.0 - 0.1 K/uL  COMPREHENSIVE METABOLIC PANEL     Status: Abnormal   Collection Time    06/17/14 12:10 PM      Result Value Ref Range   Sodium 135 (*) 137 - 147 mEq/L   Potassium 4.5  3.7 - 5.3 mEq/L   Chloride 100  96 - 112 mEq/L   CO2 19  19 - 32 mEq/L   Glucose, Bld 153 (*) 70 - 99 mg/dL   BUN 52 (*) 6 - 23  mg/dL   Creatinine, Ser 3.00 (*) 0.50 - 1.35 mg/dL   Calcium 9.7  8.4 - 10.5 mg/dL   Total Protein 7.8  6.0 - 8.3 g/dL   Albumin 3.5  3.5 - 5.2 g/dL   AST 20  0 - 37 U/L   ALT 10  0 - 53 U/L   Alkaline Phosphatase 94  39 - 117 U/L   Total Bilirubin 0.5  0.3 - 1.2 mg/dL   GFR calc non Af Amer 18 (*) >90 mL/min   GFR calc Af Amer 21 (*) >90 mL/min   Comment: (NOTE)     The eGFR has been calculated using the CKD EPI equation.     This calculation has not been validated in all clinical situations.     eGFR's persistently <90 mL/min signify possible Chronic Kidney     Disease.   Anion gap 16 (*) 5 - 15  GLUCOSE, CAPILLARY     Status: Abnormal   Collection Time    06/17/14  4:44 PM      Result Value Ref Range   Glucose-Capillary 244 (*) 70 - 99 mg/dL  GLUCOSE, CAPILLARY     Status: Abnormal   Collection Time    06/17/14  9:36 PM      Result Value Ref Range   Glucose-Capillary 164 (*) 70 - 99 mg/dL  GLUCOSE, CAPILLARY     Status: Abnormal   Collection Time    06/18/14  6:58 AM      Result Value Ref Range   Glucose-Capillary 122 (*) 70 - 99 mg/dL     HEENT: normal Cardio: RRR and no murmur Resp: CTA B/L and unlabored GI: BS positive and NT, ND Extremity:  No Edema Skin:   Other Left transerve healed breast incision at nipple level, left upper back incion well healed, dry skin both feet without open areas Neuro: Lethargic, Flat, Cranial Nerve Abnormalities  Left central 7, Abnormal Sensory decreased in both feet, Abnormal Motor bilateral hand intrinsic atrophy and Other motor tr Left bi, tri grip, 3- Left HF, KE , 2- ankle DF Musc/Skel:  Other no pain with AROM in BUE and BLE, or with neck movement, sitting balance fair minus Gen NAD   Assessment/Plan: 1. Functional deficits secondary to RIght ponomedullary hemorrhage with Left hemiparesis superimposed on severe diabetic neuropathy which require 3+ hours per day of interdisciplinary therapy in a comprehensive inpatient rehab  setting. Physiatrist is providing close team supervision and 24 hour management of active medical problems listed below. Physiatrist and rehab team continue to assess barriers to discharge/monitor patient progress toward functional and medical goals. Seems a bit weaker in LUE this am but is drowsy, will observe performance in PT/OT today FIM: FIM - Bathing Bathing Steps Patient Completed: Chest;Left Arm;Abdomen;Right upper leg;Left upper leg Bathing: 3: Mod-Patient completes 5-7 59f 10 parts or 50-74%  FIM - Upper Body Dressing/Undressing Upper body dressing/undressing: 0: Wears gown/pajamas-no public clothing FIM - Lower Body Dressing/Undressing Lower body dressing/undressing: 0: Wears gown/pajamas-no public clothing  FIM - Toileting Toileting: 0: No continent bowel/bladder events this shift  FIM - Air cabin crew Transfers: 0-Activity did not occur  FIM - Control and instrumentation engineer Devices: Sliding board;Arm rests;Bed rails Bed/Chair Transfer: 1: Bed > Chair or W/C: Total A (helper does all/Pt. < 25%);1: Chair or W/C > Bed: Total A (helper does all/Pt. < 25%);3: Sit > Supine: Mod A (lifting assist/Pt. 50-74%/lift 2 legs);2: Supine > Sit: Max A (lifting assist/Pt. 25-49%)  FIM - Locomotion: Wheelchair Distance: 40 Locomotion: Wheelchair: 1: Total Assistance/staff pushes wheelchair (Pt<25%) FIM - Locomotion: Ambulation Locomotion: Ambulation Assistive Devices: Other (comment) (3 musketeers) Ambulation/Gait Assistance: 1: +2 Total assist Locomotion: Ambulation: 1: Two helpers  Comprehension Comprehension Mode: Auditory Comprehension: 5-Understands basic 90% of the time/requires cueing < 10% of the time  Expression Expression Mode: Verbal Expression: 3-Expresses basic 50 - 74% of the time/requires cueing 25 - 50% of the time. Needs to repeat parts of sentences.  Social Interaction Social Interaction: 5-Interacts appropriately 90% of the time - Needs  monitoring or encouragement for participation or interaction.  Problem Solving Problem Solving: 4-Solves basic 75 - 89% of the time/requires cueing 10 - 24% of the time  Memory Memory: 4-Recognizes or recalls 75 - 89% of the time/requires cueing 10 - 24% of the time  Medical Problem List and Plan:  1. 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 Latest cranial CT scan stable  2. DVT Prophylaxis/Anticoagulation: Subcutaneous heparin initiated for DVT prophylaxis 06/13/2014  3. Pain Management: Tylenol as needed. Monitor the increased mobility  4. Diabetes mellitus with peripheral neuropathy. Latest hemoglobin A1c 6.7. Check blood sugars a.c. and at bedtime.Tradjenta 5 mg daily. Patient on Levemir 15 units each bedtime and Onglyza 2.5 mg daily prior to admission. Plan to resume as needed  5. Neuropsych: This patient is not capable of making decisions on his own behalf.  6. Skin/Wound Care: Routine skin checks  7. Chronic renal insufficiency. Baseline creatinine 2.84. Followup chemistries show stable GFR ~20 8. Chronic diastolic congestive heart failure. Monitor for any signs of fluid overload. Patient on Lasix 80 mg Daily.  9. Hypertension. Norvasc 10 mg daily, Coreg 6.25 mg twice a day. Monitor with increased mobility  10. CAD with history of CABG. No chest pain or shortness of breath  11. Hyperlipidemia. Lipitor  12. GERD. Protonix   LOS (Days) 4 A FACE  TO FACE EVALUATION WAS PERFORMED  Alysia Penna E 06/18/2014, 7:26 AM

## 2014-06-18 NOTE — Progress Notes (Signed)
Speech Language Pathology Daily Session Note  Patient Details  Name: [name] MRN: [id number] Date of Birth: Feb 26, 1931  Today's Date: 06/18/2014 SLP Individual Time: Session 2:[id number]; Session 2: 8315-1761 SLP Individual Time Calculation (min): Session 1: 34 min; Session 2: 38 min  Short Term Goals: Week 1: SLP Short Term Goal 1 (Week 1): Patient will utilize speech strategies (slow down, over-articulate, pause between words) to achieve 75% intelligibility at phrase level, with minimal cues. SLP Short Term Goal 2 (Week 1): Patient will demonstrate anticipatory awareness by describing impact of speech and physical deficits, with moderate cues. SLP Short Term Goal 3 (Week 1): Patient will recall and demonstrate understanding of safety precautions and strategies learned in PT, OT, SLP treatment sessions, with minimal cues. SLP Short Term Goal 4 (Week 1): Patient will solve basic level functional problems wtih 80% accuracy. SLP Short Term Goal 5 (Week 1): Pt will utilize his recommended swallowing precautions to minimize overt s/s of aspiration with presentations of his currently prescribed diet.   Skilled Therapeutic Interventions: Session 1: Pt was seen for skilled speech therapy targeting dysphagia management.  Upon arrival, pt was seated upright in wheelchair, asleep, but easily awakened to voice, and agreeable to participate in ST with minimal encouragement.  SLP completed skilled observations with presentations of pt's currently prescribed diet with pt exhibiting no overt s/s of aspiration with solids.  Pt was noted with mild residuals left in the oral cavity due to impaired mastication resulting from left labial and lingual weakness which cleared with increased time and spontaneous use of slow rate of feeding.   Pt was noted with mildly wet vocal quality following cup sips of thin liquids which cleared with supervision cuing for gentle throat clear.    Session 2: Pt was seen for  skilled speech therapy targeting cognitive-linguistic goals.  Upon arrival, pt was seated upright in wheelchair, asleep, but easily awakened to voice, and agreeable to participate in [place].  SLP facilitated the session with a structured picture description task targeting use of speech intelligibility strategies at the phrase level as well as basic safety awareness and problem solving.  Pt identified problems in pictures and generated appropriate solutions with min assist faded to supervision cues.  Pt recalled at least 2 dysarthria strategies from yesterday's therapy session with supervision question cues and used slow rate and increased vocal intensity during the abovementioned task with min assist cuing to improve speech intelligibility to ~75% in a quiet environment (ST treatment room with door closed).  Pt's daughter present at the end of today's therapy session and presented with questions related to pt's current goals and progress in therapy.  SLP initiated skilled education related to compensatory strategies for dysarthria, including ways in which pt's daughter can cue pt for use of strategies in conversations to improve carryover of education outside of therapy.  SLP also provided skilled education related to pt's dysphagia, including recommendations for no straws and single sips from the cup when consuming thin liquids.  Pt daughter verbalized understanding.  Continue per current plan of care.    FIM:  Comprehension Comprehension Mode: Auditory Comprehension: 5-Understands basic 90% of the time/requires cueing < 10% of the time Expression Expression Mode: Verbal Expression: 5-Expresses basic 90% of the time/requires cueing < 10% of the time. Social Interaction Social Interaction: 4-Interacts appropriately 75 - 89% of the time - Needs redirection for appropriate language or to initiate interaction. Problem Solving Problem Solving: 3-Solves basic 50 - 74% of the time/requires cueing 25 -  49% of the  time Memory Memory: 4-Recognizes or recalls 75 - 89% of the time/requires cueing 10 - 24% of the time FIM - Eating Eating Activity: 5: Needs verbal cues/supervision  Pain Pain Assessment (Session 1) Pain Assessment: No/denies pain Pain Assessment (Session 2) Pain Assessment: No/denies pain  Therapy/Group: Individual Therapy  [name], M.A. CCC-SLP  Kyna Blahnik, Selinda Orion 06/18/2014, 4:10 PM

## 2014-06-18 NOTE — Progress Notes (Signed)
Occupational Therapy Session Note  Patient Details  Name: [name] MRN: [id number] Date of Birth: 25-May-1931  Today's Date: 06/18/2014 OT Individual Time: 0175-1025 OT Individual Time Calculation (min): 65 min    Short Term Goals: Week 1:  OT Short Term Goal 1 (Week 1): Pt will perform toilet transfer with max A OT Short Term Goal 2 (Week 1): Pt will engage in 10 minutes of functional activity before requiring rest break in order to increase endurance. OT Short Term Goal 3 (Week 1): Pt maintain dynamic sitting balance with supervision for 5 minutes during activity. OT Short Term Goal 4 (Week 1): Pt will perform LB dressing with Max A.   Skilled Therapeutic Interventions/Progress Updates:    Engaged in ADL retraining with focus on bed mobility, sitting balance, education on hemi-dressing technique, and sit > stand.  Pt in bed incontinent of bowel upon arrival, reports unaware.  Engaged in [place] and Lt to clean perineal area with pt completing rolling with mod assist and physical assist to position LLE.  Bathing and dressing completed seated EOB with noted improved midline sitting, however tends to stay in posterior pelvic tilt during static sitting.  Encouraged pt to wash BLE and shins to promote anterior weight shift and challenge sitting balance, with pt requiring mod assist to correct Lt lateral lean.  Pt's family member brought in button up shirts and pants since last session.  Pt required total assist with dressing, educating pt on hemi-dressing technique however unable to complete tasks this session.  +2 for sit > stand to pull pants over hips with pt maintaining forward flexed posture in standing.  Performed stand pivot +2 under the shoulders with manual facilitation to pivot hips to sit in w/c.  Pt left in w/c with call bell in reach and quick release belt in place, informed nurse tech of pt expressing interest in eating breakfast.  Therapy Documentation Precautions:   Precautions Precautions: Fall Restrictions Weight Bearing Restrictions: No Pain: Pain Assessment Pain Assessment: No/denies pain Pain Score: 0-No pain  See FIM for current functional status  Therapy/Group: Individual Therapy  Simonne Come 06/18/2014, 10:48 AM

## 2014-06-18 NOTE — Plan of Care (Signed)
Problem: RH BLADDER ELIMINATION Goal: RH STG MANAGE BLADDER WITH ASSISTANCE STG Manage Bladder With min Assistance  Outcome: Not Progressing Incontinent of bladder     

## 2014-06-18 NOTE — Plan of Care (Signed)
Problem: RH BLADDER ELIMINATION Goal: RH STG MANAGE BLADDER WITH ASSISTANCE STG Manage Bladder With min Assistance  Outcome: Progressing Total assist

## 2014-06-19 ENCOUNTER — Inpatient Hospital Stay (HOSPITAL_COMMUNITY): Payer: Medicare Other | Admitting: Speech Pathology

## 2014-06-19 ENCOUNTER — Inpatient Hospital Stay (HOSPITAL_COMMUNITY): Payer: Medicare HMO | Admitting: Occupational Therapy

## 2014-06-19 ENCOUNTER — Inpatient Hospital Stay (HOSPITAL_COMMUNITY): Payer: Medicare HMO | Admitting: Physical Therapy

## 2014-06-19 LAB — GLUCOSE, CAPILLARY
GLUCOSE-CAPILLARY: 139 mg/dL — AB (ref 70–99)
GLUCOSE-CAPILLARY: 162 mg/dL — AB (ref 70–99)
Glucose-Capillary: 93 mg/dL (ref 70–99)
Glucose-Capillary: 119 mg/dL — ABNORMAL HIGH (ref 70–99)

## 2014-06-19 NOTE — Progress Notes (Signed)
Refused heparin last night stated he already had it. i let him know he gets it two times a  Day he still refused

## 2014-06-19 NOTE — Progress Notes (Signed)
Subjective/Complaints: 80 y.o. right-handed male with history diabetes mellitus with peripheral neuropathy, CAD status post CABG, atrial flutter on chronic Coumadin, chronic renal insufficiency with baseline creatinine 6.94, chronic diastolic congestive heart failure. Independent with occasional cane and driving living with family. Admitted 06/11/2049 of left-sided weakness and slurred speech as well as recent fall but denies striking his head. Cranial CT scan shows a right pontomedullary 14 x 22 mm oval focus consistent with acute hemorrhage. INR on admission of 8.54 and systolic blood pressure 627. Placed on intravenous Lopressor and Coumadin was reversed. Followup cranial CT scan unchanged. 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 Tolerating a regular consistency diet. Coumadin remains on hold with cardiac rate control.   No issues overnite  Review of Systems - Negative except weak on left  Objective: Vital Signs: Blood pressure 150/58, pulse 58, temperature 98.8 F (37.1 C), temperature source Oral, resp. rate 18, SpO2 98.00%. No results found. Results for orders placed during the hospital encounter of 06/14/14 (from the past 72 hour(s))  GLUCOSE, CAPILLARY     Status: Abnormal   Collection Time    06/16/14 11:59 AM      Result Value Ref Range   Glucose-Capillary 111 (*) 70 - 99 mg/dL   Comment 1 Notify RN    GLUCOSE, CAPILLARY     Status: Abnormal   Collection Time    06/16/14  4:38 PM      Result Value Ref Range   Glucose-Capillary 149 (*) 70 - 99 mg/dL   Comment 1 Notify RN    GLUCOSE, CAPILLARY     Status: Abnormal   Collection Time    06/16/14  9:52 PM      Result Value Ref Range   Glucose-Capillary 163 (*) 70 - 99 mg/dL  GLUCOSE, CAPILLARY     Status: Abnormal   Collection Time    06/17/14  7:23 AM      Result Value Ref Range   Glucose-Capillary 110 (*) 70 - 99 mg/dL   Comment 1 Notify RN    GLUCOSE, CAPILLARY     Status: Abnormal   Collection Time    06/17/14 11:28 AM      Result Value Ref Range   Glucose-Capillary 151 (*) 70 - 99 mg/dL   Comment 1 Notify RN    CBC WITH DIFFERENTIAL     Status: Abnormal   Collection Time    06/17/14 12:10 PM      Result Value Ref Range   WBC 4.9  4.0 - 10.5 K/uL   RBC 3.80 (*) 4.22 - 5.81 MIL/uL   Hemoglobin 10.7 (*) 13.0 - 17.0 g/dL   HCT 32.0 (*) 39.0 - 52.0 %   MCV 84.2  78.0 - 100.0 fL   MCH 28.2  26.0 - 34.0 pg   MCHC 33.4  30.0 - 36.0 g/dL   RDW 17.0 (*) 11.5 - 15.5 %   Platelets 97 (*) 150 - 400 K/uL   Comment: REPEATED TO VERIFY     CONSISTENT WITH PREVIOUS RESULT   Neutrophils Relative % 73  43 - 77 %   Neutro Abs 3.6  1.7 - 7.7 K/uL   Lymphocytes Relative 19  12 - 46 %   Lymphs Abs 0.9  0.7 - 4.0 K/uL   Monocytes Relative 8  3 - 12 %   Monocytes Absolute 0.4  0.1 - 1.0 K/uL   Eosinophils Relative 0  0 - 5 %   Eosinophils Absolute 0.0  0.0 -  0.7 K/uL   Basophils Relative 0  0 - 1 %   Basophils Absolute 0.0  0.0 - 0.1 K/uL  COMPREHENSIVE METABOLIC PANEL     Status: Abnormal   Collection Time    06/17/14 12:10 PM      Result Value Ref Range   Sodium 135 (*) 137 - 147 mEq/L   Potassium 4.5  3.7 - 5.3 mEq/L   Chloride 100  96 - 112 mEq/L   CO2 19  19 - 32 mEq/L   Glucose, Bld 153 (*) 70 - 99 mg/dL   BUN 52 (*) 6 - 23 mg/dL   Creatinine, Ser 3.00 (*) 0.50 - 1.35 mg/dL   Calcium 9.7  8.4 - 10.5 mg/dL   Total Protein 7.8  6.0 - 8.3 g/dL   Albumin 3.5  3.5 - 5.2 g/dL   AST 20  0 - 37 U/L   ALT 10  0 - 53 U/L   Alkaline Phosphatase 94  39 - 117 U/L   Total Bilirubin 0.5  0.3 - 1.2 mg/dL   GFR calc non Af Amer 18 (*) >90 mL/min   GFR calc Af Amer 21 (*) >90 mL/min   Comment: (NOTE)     The eGFR has been calculated using the CKD EPI equation.     This calculation has not been validated in all clinical situations.     eGFR's persistently <90 mL/min signify possible Chronic Kidney     Disease.   Anion gap 16 (*) 5 - 15  GLUCOSE, CAPILLARY     Status: Abnormal    Collection Time    06/17/14  4:44 PM      Result Value Ref Range   Glucose-Capillary 244 (*) 70 - 99 mg/dL  GLUCOSE, CAPILLARY     Status: Abnormal   Collection Time    06/17/14  9:36 PM      Result Value Ref Range   Glucose-Capillary 164 (*) 70 - 99 mg/dL  GLUCOSE, CAPILLARY     Status: Abnormal   Collection Time    06/18/14  6:58 AM      Result Value Ref Range   Glucose-Capillary 122 (*) 70 - 99 mg/dL  GLUCOSE, CAPILLARY     Status: Abnormal   Collection Time    06/18/14 11:09 AM      Result Value Ref Range   Glucose-Capillary 142 (*) 70 - 99 mg/dL   Comment 1 Notify RN    GLUCOSE, CAPILLARY     Status: Abnormal   Collection Time    06/18/14  4:29 PM      Result Value Ref Range   Glucose-Capillary 163 (*) 70 - 99 mg/dL  GLUCOSE, CAPILLARY     Status: Abnormal   Collection Time    06/18/14  8:57 PM      Result Value Ref Range   Glucose-Capillary 133 (*) 70 - 99 mg/dL  GLUCOSE, CAPILLARY     Status: None   Collection Time    06/19/14  6:54 AM      Result Value Ref Range   Glucose-Capillary 93  70 - 99 mg/dL   Comment 1 Notify RN       HEENT: normal Cardio: RRR and no murmur Resp: CTA B/L and unlabored GI: BS positive and NT, ND Extremity:  No Edema Skin:   Other Left transerve healed breast incision at nipple level, left upper back incion well healed, dry skin both feet without open areas Neuro: Lethargic, Flat, Cranial Nerve Abnormalities  Left central 7, Abnormal Sensory decreased in both feet, Abnormal Motor bilateral hand intrinsic atrophy and Other motor tr Left bi, tri grip, 3- Left HF, KE , 2- ankle DF Musc/Skel:  Other no pain with AROM in BUE and BLE, or with neck movement, sitting balance fair minus Gen NAD   Assessment/Plan: 1. Functional deficits secondary to RIght ponomedullary hemorrhage with Left hemiparesis superimposed on severe diabetic neuropathy which require 3+ hours per day of interdisciplinary therapy in a comprehensive inpatient rehab  setting. Physiatrist is providing close team supervision and 24 hour management of active medical problems listed below. Physiatrist and rehab team continue to assess barriers to discharge/monitor patient progress toward functional and medical goals. Team conference today please see physician documentation under team conference tab, met with team face-to-face to discuss problems,progress, and goals. Formulized individual treatment plan based on medical history, underlying problem and comorbidities. FIM: FIM - Bathing Bathing Steps Patient Completed: Chest;Left Arm;Abdomen;Right upper leg;Left upper leg Bathing: 3: Mod-Patient completes 5-7 54f 10 parts or 50-74%  FIM - Upper Body Dressing/Undressing Upper body dressing/undressing: 1: Total-Patient completed less than 25% of tasks FIM - Lower Body Dressing/Undressing Lower body dressing/undressing: 1: Two helpers  FIM - Toileting Toileting: 0: No continent bowel/bladder events this shift  FIM - Air cabin crew Transfers: 0-Activity did not occur  FIM - Control and instrumentation engineer Devices: Arm rests Bed/Chair Transfer: 1: Two helpers  FIM - Locomotion: Wheelchair Distance: 40 Locomotion: Wheelchair: 1: Total Assistance/staff pushes wheelchair (Pt<25%) FIM - Locomotion: Ambulation Locomotion: Ambulation Assistive Devices: Other (comment) (3 musketeers) Ambulation/Gait Assistance: 1: +2 Total assist Locomotion: Ambulation: 0: Activity did not occur  Comprehension Comprehension Mode: Auditory Comprehension: 5-Understands basic 90% of the time/requires cueing < 10% of the time  Expression Expression Mode: Verbal Expression: 5-Expresses basic 90% of the time/requires cueing < 10% of the time.  Social Interaction Social Interaction: 4-Interacts appropriately 75 - 89% of the time - Needs redirection for appropriate language or to initiate interaction.  Problem Solving Problem Solving: 3-Solves basic 50 -  74% of the time/requires cueing 25 - 49% of the time  Memory Memory: 4-Recognizes or recalls 75 - 89% of the time/requires cueing 10 - 24% of the time  Medical Problem List and Plan:  1. 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 Latest cranial CT scan stable  2. DVT Prophylaxis/Anticoagulation: Subcutaneous heparin initiated for DVT prophylaxis 06/13/2014  3. Pain Management: Tylenol as needed. Monitor the increased mobility  4. Diabetes mellitus with peripheral neuropathy. Latest hemoglobin A1c 6.7. Check blood sugars a.c. and at bedtime.Tradjenta 5 mg daily. Patient on Levemir 15 units each bedtime and Onglyza 2.5 mg daily prior to admission. Plan to resume as needed  5. Neuropsych: This patient is not capable of making decisions on his own behalf.  6. Skin/Wound Care: Routine skin checks  7. Chronic renal insufficiency. Baseline creatinine 2.84. Followup chemistries show stable GFR ~20 8. Chronic diastolic congestive heart failure. Monitor for any signs of fluid overload. Patient on Lasix 80 mg Daily.  9. Hypertension. Norvasc 10 mg daily, Coreg 6.25 mg twice a day. Monitor with increased mobility  10. CAD with history of CABG. No chest pain or shortness of breath  11. Hyperlipidemia. Lipitor  12. GERD. Protonix   LOS (Days) 5 A FACE TO FACE EVALUATION WAS PERFORMED  KIRSTEINS,ANDREW E 06/19/2014, 8:24 AM

## 2014-06-19 NOTE — Progress Notes (Signed)
Physical Therapy Session Note  Patient Details  Name: [name] MRN: [id number] Date of Birth: 1931/03/24  Today's Date: 06/19/2014 PT Individual Time: 0930-1030 PT Individual Time Calculation (min): 60 min   Short Term Goals: Week 1:  PT Short Term Goal 1 (Week 1): Pt will demonstrate rolling L in bed req min A PT Short Term Goal 2 (Week 1): Pt will demonstrate supine to sit req mod A PT Short Term Goal 3 (Week 1): Pt will demonstrate stand-pivot transfer bed to w/c req mod A.  PT Short Term Goal 4 (Week 1): Pt will self propel manual w/c x 50' req min A PT Short Term Goal 5 (Week 1): Pt will tolerate ambulation with 2 person assist.   Skilled Therapeutic Interventions/Progress Updates:    2:1. Pt received supine in bed; agreeable to therapy. Session focused on increasing pt independence with functional transfers, initiating self-propulsion of w/c. Performed squat pivot transfer from bed>w/c with +2A, multimodal cueing for full anterior weight shift, manual stabilization of R foot positon on floor. Transported pt to gym in w/c, where pt performed lateral scooting transfer from w/c>mat table with slide board and +2A; cueing/stabilization as described above. In seated, NMR focused on RUE anterolateral reaching (with LUE weightbearing for activation) to facilitate full anterior weight shift to increase pt independence with transfers. Transitioned to partial then full sit<>stand transfers without UE support to avoid RUE pushing; tactile cueing at L ribcage, R posterior pelvis to emphasize erect trunk flexion. Dycem placed under L hand, R foot to maintain positioning. Performed lateral scooting transfer (Bobath technique) to L side without slide board with Total A of single therapist, max multimodal cueing for anterior weight shift. Pt performed w/c mobility x20' in controlled environment with R hemi technique and max A, HOH assist at R hand for technique; tactile cueing at R knee for  weightbearing. Session ended in pt room, where pt was left seated in w/c with quick release belt in place for safety and all needs within reach.  Therapy Documentation Precautions:  Precautions Precautions: Fall Restrictions Weight Bearing Restrictions: No Pain: Pain Assessment Pain Assessment: No/denies pain Locomotion : Wheelchair Mobility Distance: 20   See FIM for current functional status  Therapy/Group: Individual Therapy  Ajahni Nay, Malva Cogan 06/19/2014, 10:42 AM

## 2014-06-19 NOTE — Progress Notes (Signed)
Speech Language Pathology Daily Session Note  Patient Details  Name: [name] MRN: [id number] Date of Birth: [birth year]  Today's Date: 06/19/2014 SLP Individual Time: 0258-5277 SLP Individual Time Calculation (min): 45 min  Short Term Goals: Week 1: SLP Short Term Goal 1 (Week 1): Patient will utilize speech strategies (slow down, over-articulate, pause between words) to achieve 75% intelligibility at phrase level, with minimal cues. SLP Short Term Goal 2 (Week 1): Patient will demonstrate anticipatory awareness by describing impact of speech and physical deficits, with moderate cues. SLP Short Term Goal 3 (Week 1): Patient will recall and demonstrate understanding of safety precautions and strategies learned in PT, OT, SLP treatment sessions, with minimal cues. SLP Short Term Goal 4 (Week 1): Patient will solve basic level functional problems wtih 80% accuracy. SLP Short Term Goal 5 (Week 1): Pt will utilize his recommended swallowing precautions to minimize overt s/s of aspiration with presentations of his currently prescribed diet.   Skilled Therapeutic Interventions:  Pt was seen for skilled speech therapy targeting cognitive-linguistic goals.  Upon arrival, pt was asleep but easily awakened to voice and light touch.  SLP facilitated the session with repetitions of oral motor exercises which pt required min assist visual and verbal cues to complete.  Pt also recalled 1 dysarthria strategy from previous therapy session with min assist question cues; however, when presented with written aid to facilitated improved recall of additional strategies, pt was noted with significant difficulty reading.  Pt able to identify letters for 100% accuracy with min assist, but required up to max assist for decoding at the word level. Pt able to match word to object from a field of three with overall mod assist for ~75% accuracy.  Pt reports that he was a "good" reader at baseline and has a 9th grade  education.  Suspect sustained attention and delayed processing are impacting his ability to accurately decode text consistently.  Will continue to follow up for ongoing diagnostic treatments.  Continue per current plan of care.     FIM:  Comprehension Comprehension Mode: Auditory Comprehension: 5-Understands basic 90% of the time/requires cueing < 10% of the time Expression Expression Mode: Verbal Expression: 3-Expresses basic 50 - 74% of the time/requires cueing 25 - 50% of the time. Needs to repeat parts of sentences. Social Interaction Social Interaction: 4-Interacts appropriately 75 - 89% of the time - Needs redirection for appropriate language or to initiate interaction. Problem Solving Problem Solving: 3-Solves basic 50 - 74% of the time/requires cueing 25 - 49% of the time Memory Memory: 4-Recognizes or recalls 75 - 89% of the time/requires cueing 10 - 24% of the time  Pain Pain Assessment Pain Assessment: No/denies pain  Therapy/Group: Individual Therapy  [name], M.A. CCC-SLP  Nyx Keady, Selinda Orion 06/19/2014, 4:36 PM

## 2014-06-19 NOTE — Progress Notes (Addendum)
Occupational Therapy Session Note  Patient Details  Name: [name] MRN: [id number] Date of Birth: 05/08/1931  Today's Date: 06/19/2014 OT Individual Time: 1113-1200 and 1330-1400 OT Individual Time Calculation (min): 47 min and 30 min   Short Term Goals: Week 1:  OT Short Term Goal 1 (Week 1): Pt will perform toilet transfer with max A OT Short Term Goal 2 (Week 1): Pt will engage in 10 minutes of functional activity before requiring rest break in order to increase endurance. OT Short Term Goal 3 (Week 1): Pt maintain dynamic sitting balance with supervision for 5 minutes during activity. OT Short Term Goal 4 (Week 1): Pt will perform LB dressing with Max A.   Skilled Therapeutic Interventions/Progress Updates:    1) Engaged in ADL retraining with focus on bed mobility and dynamic sitting balance.  Pt in bed upon arrival, reports not wanting to get dressed this session but willing to participate in bathing at EOB.  Focus on carryover of bed mobility and recall of proper hand placement.  Bathing completed at EOB with focus on dynamic sitting balance with bending forward to wash BLE down to shins, pt with improved postural control this session requiring only mod assist to correct balance after forward reaching.  Min verbal cues for midline posture with pt able to correct independently.  Hand over hand to utilize LUE to apply deodorant with noted minimal horizontal adduction/internal rotation.  Pt requested to return to bed despite encouragement to sit up in w/c for lunch.  2) Engaged in [place] with focus on dynamic sitting balance and use of LUE.  Pt in bed upon arrival refusing to get OOB but willing to participate in treatment at EOB.  Bed mobility to Lt to promote increased WB through LUE and pushing through Lt elbow into sitting.  Seated at EOB engaged in PROM progressing to [facility] with pt with minimal internal and external rotation and shoulder elevation.  Engaged in further challenge of  dynamic sitting balance with reaching outside BOS to Lt and Rt to promote weight shifting and awareness of balance, while WB through LUE elbow progressing to hand.  Therapy Documentation Precautions:  Precautions Precautions: Fall Restrictions Weight Bearing Restrictions: No Pain:  Pt with no c/o pain  See FIM for current functional status  Therapy/Group: Individual Therapy  Pachia Strum, Mullen 06/19/2014, 3:00 PM

## 2014-06-19 NOTE — Progress Notes (Signed)
Social Work Patient ID: Alexandria Lodge, male   DOB: 03/01/1931, 80 y.o.   MRN: [id number]  CSW spoke with pt's dtr, Lysle Morales, via telephone and learned that pt will most likely not have someone with him 24/7 at home.  Her son has helped pt in the past, but per dtr, pt can be very stubborn and her son can "only take so much" so he does what he can to help pt and then he leaves.  Pt refused to go to the MD or ER when family could tell he was having a problem (CVA) and they finally convinced him a couple of days later.  She knows earlier care would have prevented pt from being in the condition he is now.  Dtr works two jobs and is sick herself, so she cannot provide the care.  CSW inquired whether pt had the financial resources to hire caregivers when his son is at work.  She was unsure if he could or even would do this.  Dtr is aware of pt's goal of returning to his home and his refusal in the past of SNF.  She wants to give pt a week of rehab time and then for [place] to re-evaluate his progress and make a plan from that point.  CSW explained team conference is today and we would set a tentative d/c date.  CSW will continue to follow and assist as needed.

## 2014-06-20 ENCOUNTER — Inpatient Hospital Stay (HOSPITAL_COMMUNITY): Payer: Medicare HMO | Admitting: *Deleted

## 2014-06-20 ENCOUNTER — Encounter (HOSPITAL_COMMUNITY): Payer: Self-pay | Admitting: *Deleted

## 2014-06-20 ENCOUNTER — Inpatient Hospital Stay (HOSPITAL_COMMUNITY): Payer: Medicare HMO | Admitting: Occupational Therapy

## 2014-06-20 ENCOUNTER — Ambulatory Visit (HOSPITAL_COMMUNITY): Payer: Medicare Other | Admitting: Speech Pathology

## 2014-06-20 LAB — GLUCOSE, CAPILLARY
GLUCOSE-CAPILLARY: 186 mg/dL — AB (ref 70–99)
Glucose-Capillary: 114 mg/dL — ABNORMAL HIGH (ref 70–99)
Glucose-Capillary: 160 mg/dL — ABNORMAL HIGH (ref 70–99)
Glucose-Capillary: 168 mg/dL — ABNORMAL HIGH (ref 70–99)

## 2014-06-20 MED ORDER — TRAMADOL HCL 50 MG PO TABS
50.0000 mg | ORAL_TABLET | Freq: Once | ORAL | Status: AC
  Administered 2014-06-20: 50 mg via ORAL
  Filled 2014-06-20: qty 1

## 2014-06-20 NOTE — Progress Notes (Signed)
Physical Therapy Session Note  Patient Details  Name: [name] MRN: [id number] Date of Birth: 09-23-1931  Today's Date: 06/20/2014 PT Individual Time:  10:00-11:00 (101min)  And 13:30-14:00 (7min)  Short Term Goals: Week 1:  PT Short Term Goal 1 (Week 1): Pt will demonstrate rolling L in bed req min A PT Short Term Goal 2 (Week 1): Pt will demonstrate supine to sit req mod A PT Short Term Goal 3 (Week 1): Pt will demonstrate stand-pivot transfer bed to w/c req mod A.  PT Short Term Goal 4 (Week 1): Pt will self propel manual w/c x 50' req min A PT Short Term Goal 5 (Week 1): Pt will tolerate ambulation with 2 person assist.  Week 2:     Skilled Therapeutic Interventions/Progress Updates:  First tx   Tx focused on functional mobility trianing, and NMR via forced use, manual facilitation, and multi-modal cues during straddle sitting on bench.  +2 assist throughout for safety.   Pt resting in bed, performed supine>sit via sidelying with max sequence cues and Mod A for trunk and rolling.   Performed bed>WC transfer with +2 needed for lifting/lowering and turning hips. Bobath technique.   Instructed pt in hemi-WC technique, but unable to coordinate effective RLE steering due to ataxia. 67' with max A.   Performed NMR, initially sitting in WC forwrad leaning on forearms to bench, progressed to straddle sitting on bench in front of high/low mat. Assisted pt to anterior scoot to bench from [facility] and performed the following to increase anterior leaning, increase velocity of movement, and increase trunk ROM.  - Far R/L reaching to ring and place outside BOS - Mini-squats 3x5 with up to Mod/max A to clear hips - upright sitting posture for increased midline orientation and trunk control - Quick reach/place tasks - Quick forward lean/hip lifts Pt needed S for static unsupported sitting, up to Mod A dynamic sitting, and cues for "head down, hips up" to clear hips.   Pt left up in [facility] with lap  belt and all needs in reach.   Second tx focused on NMR in standing at Steady for increased midline orientation and LE strengthening.  Pt up in WC< staffed pushed to gym. Pt continues to c/o pain, will notify PA.   Performed 4 sit <>stands in steady with +2 for initial to clear hips with pads. Otherwise, +2 used to pull hips into ext with sheet, other person to assist at L knee in standing.  Performed various staic and dynamic reaching tasks in all quadrants, and challenged pt to increase velocity to grab. Pt challenged to release RUE support in all tasks.  Mirror used to increase visual awareness of midline and righting.    Therapy Documentation Precautions:  Precautions Precautions: Fall Restrictions Weight Bearing Restrictions: No    Pain: 10/10 L posterior occipital pain, "I can feel it when I turn my head." Nursing made aware and brought Tylenol.     See FIM for current functional status  Therapy/Group: Individual Therapy Kennieth Rad, PT, DPT   06/20/2014, 10:29 AM

## 2014-06-20 NOTE — Plan of Care (Signed)
Problem: RH BLADDER ELIMINATION Goal: RH STG MANAGE BLADDER WITH ASSISTANCE STG Manage Bladder With min Assistance  Outcome: Not Progressing Incontinent ,uses condom cath at night

## 2014-06-20 NOTE — Progress Notes (Signed)
Occupational Therapy Session Note  Patient Details  Name: [name] MRN: [id number] Date of Birth: 08-08-1931  Today's Date: 06/20/2014 OT Individual Time: 0815-0902 OT Individual Time Calculation (min): 47 min    Short Term Goals: Week 1:  OT Short Term Goal 1 (Week 1): Pt will perform toilet transfer with max A OT Short Term Goal 2 (Week 1): Pt will engage in 10 minutes of functional activity before requiring rest break in order to increase endurance. OT Short Term Goal 3 (Week 1): Pt maintain dynamic sitting balance with supervision for 5 minutes during activity. OT Short Term Goal 4 (Week 1): Pt will perform LB dressing with Max A.   Skilled Therapeutic Interventions/Progress Updates:    Engaged in ADL retraining with focus on bed mobility, dynamic sitting balance, sit > stand, and functional use of LUE during self-care tasks of bathing and dressing.  Completed bed mobility with rolling Rt and Lt for perineal hygiene with pt requiring mod assist and mod verbal cues for sequencing and positioning of LLE to assist in rolling.  Bathing and dressing completed seated at EOB with focus on dynamic sitting balance with pt demonstrating increased midline sitting during static sitting, however with forward leaning to wash LLE pt tends to lose balance to Lt.  Pt able to recognize LOB but requires mod assist to stabilize and utilizes bed rails to correct LOB.  Utilized hand over hand assist to incorporate LUE into tasks with stabilizing deodorant while opening and then applying deodorant.  Pt with increased horizontal adduction and adduction this session.  Educated on hemi-dressing technique, with assist to thread Rt and Lt arms but pt able to pull shirt over head and with mod cues correct and pull shirt over trunk.  +2 for sit > stand to pull pants over hips, pt continues to stand with forward flexed posture.  Pt requested to return to bed at end of session.  Encouraged pt to push through BLE to  bridge and push self up in bed, requiring stabilizing at Lt foot.  Therapy Documentation Precautions:  Precautions Precautions: Fall Restrictions Weight Bearing Restrictions: No Pain:  Pt with no c/o pain  See FIM for current functional status  Therapy/Group: Individual Therapy  Simonne Come 06/20/2014, 9:11 AM

## 2014-06-20 NOTE — Progress Notes (Signed)
Subjective/Complaints: 80 y.o. right-handed male with history diabetes mellitus with peripheral neuropathy, CAD status post CABG, atrial flutter on chronic Coumadin, chronic renal insufficiency with baseline creatinine 7.65, chronic diastolic congestive heart failure. Independent with occasional cane and driving living with family. Admitted 06/11/2049 of left-sided weakness and slurred speech as well as recent fall but denies striking his head. Cranial CT scan shows a right pontomedullary 14 x 22 mm oval focus consistent with acute hemorrhage. INR on admission of 4.65 and systolic blood pressure 035. Placed on intravenous Lopressor and Coumadin was reversed. Followup cranial CT scan unchanged. 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 Tolerating a regular consistency diet. Coumadin remains on hold with cardiac rate control.   Slept poorly but couldn't say why Admits to bladder inc but states he can control bowel (RN says no) Review of Systems - Negative except weak on left  Objective: Vital Signs: Blood pressure 166/62, pulse 57, temperature 98.3 F (36.8 C), temperature source Oral, resp. rate 17, weight 81.149 kg (178 lb 14.4 oz), SpO2 98.00%. No results found. Results for orders placed during the hospital encounter of 06/14/14 (from the past 72 hour(s))  GLUCOSE, CAPILLARY     Status: Abnormal   Collection Time    06/17/14 11:28 AM      Result Value Ref Range   Glucose-Capillary 151 (*) 70 - 99 mg/dL   Comment 1 Notify RN    CBC WITH DIFFERENTIAL     Status: Abnormal   Collection Time    06/17/14 12:10 PM      Result Value Ref Range   WBC 4.9  4.0 - 10.5 K/uL   RBC 3.80 (*) 4.22 - 5.81 MIL/uL   Hemoglobin 10.7 (*) 13.0 - 17.0 g/dL   HCT 32.0 (*) 39.0 - 52.0 %   MCV 84.2  78.0 - 100.0 fL   MCH 28.2  26.0 - 34.0 pg   MCHC 33.4  30.0 - 36.0 g/dL   RDW 17.0 (*) 11.5 - 15.5 %   Platelets 97 (*) 150 - 400 K/uL   Comment: REPEATED TO VERIFY   CONSISTENT WITH PREVIOUS RESULT   Neutrophils Relative % 73  43 - 77 %   Neutro Abs 3.6  1.7 - 7.7 K/uL   Lymphocytes Relative 19  12 - 46 %   Lymphs Abs 0.9  0.7 - 4.0 K/uL   Monocytes Relative 8  3 - 12 %   Monocytes Absolute 0.4  0.1 - 1.0 K/uL   Eosinophils Relative 0  0 - 5 %   Eosinophils Absolute 0.0  0.0 - 0.7 K/uL   Basophils Relative 0  0 - 1 %   Basophils Absolute 0.0  0.0 - 0.1 K/uL  COMPREHENSIVE METABOLIC PANEL     Status: Abnormal   Collection Time    06/17/14 12:10 PM      Result Value Ref Range   Sodium 135 (*) 137 - 147 mEq/L   Potassium 4.5  3.7 - 5.3 mEq/L   Chloride 100  96 - 112 mEq/L   CO2 19  19 - 32 mEq/L   Glucose, Bld 153 (*) 70 - 99 mg/dL   BUN 52 (*) 6 - 23 mg/dL   Creatinine, Ser 3.00 (*) 0.50 - 1.35 mg/dL   Calcium 9.7  8.4 - 10.5 mg/dL   Total Protein 7.8  6.0 - 8.3 g/dL   Albumin 3.5  3.5 - 5.2 g/dL   AST 20  0 - 37 U/L  ALT 10  0 - 53 U/L   Alkaline Phosphatase 94  39 - 117 U/L   Total Bilirubin 0.5  0.3 - 1.2 mg/dL   GFR calc non Af Amer 18 (*) >90 mL/min   GFR calc Af Amer 21 (*) >90 mL/min   Comment: (NOTE)     The eGFR has been calculated using the CKD EPI equation.     This calculation has not been validated in all clinical situations.     eGFR's persistently <90 mL/min signify possible Chronic Kidney     Disease.   Anion gap 16 (*) 5 - 15  GLUCOSE, CAPILLARY     Status: Abnormal   Collection Time    06/17/14  4:44 PM      Result Value Ref Range   Glucose-Capillary 244 (*) 70 - 99 mg/dL  GLUCOSE, CAPILLARY     Status: Abnormal   Collection Time    06/17/14  9:36 PM      Result Value Ref Range   Glucose-Capillary 164 (*) 70 - 99 mg/dL  GLUCOSE, CAPILLARY     Status: Abnormal   Collection Time    06/18/14  6:58 AM      Result Value Ref Range   Glucose-Capillary 122 (*) 70 - 99 mg/dL  GLUCOSE, CAPILLARY     Status: Abnormal   Collection Time    06/18/14 11:09 AM      Result Value Ref Range   Glucose-Capillary 142 (*) 70 - 99  mg/dL   Comment 1 Notify RN    GLUCOSE, CAPILLARY     Status: Abnormal   Collection Time    06/18/14  4:29 PM      Result Value Ref Range   Glucose-Capillary 163 (*) 70 - 99 mg/dL  GLUCOSE, CAPILLARY     Status: Abnormal   Collection Time    06/18/14  8:57 PM      Result Value Ref Range   Glucose-Capillary 133 (*) 70 - 99 mg/dL  GLUCOSE, CAPILLARY     Status: None   Collection Time    06/19/14  6:54 AM      Result Value Ref Range   Glucose-Capillary 93  70 - 99 mg/dL   Comment 1 Notify RN    GLUCOSE, CAPILLARY     Status: Abnormal   Collection Time    06/19/14 12:05 PM      Result Value Ref Range   Glucose-Capillary 139 (*) 70 - 99 mg/dL   Comment 1 Notify RN    GLUCOSE, CAPILLARY     Status: Abnormal   Collection Time    06/19/14  4:45 PM      Result Value Ref Range   Glucose-Capillary 119 (*) 70 - 99 mg/dL  GLUCOSE, CAPILLARY     Status: Abnormal   Collection Time    06/19/14  9:54 PM      Result Value Ref Range   Glucose-Capillary 162 (*) 70 - 99 mg/dL  GLUCOSE, CAPILLARY     Status: Abnormal   Collection Time    06/20/14  6:59 AM      Result Value Ref Range   Glucose-Capillary 114 (*) 70 - 99 mg/dL     HEENT: normal Cardio: RRR and no murmur Resp: CTA B/L and unlabored GI: BS positive and NT, ND Extremity:  No Edema Skin:   Other Left transerve healed breast incision at nipple level, left upper back incion well healed, dry skin both feet without open areas Neuro: Lethargic,  Flat, Cranial Nerve Abnormalities Left central 7, Abnormal Sensory decreased in both feet, Abnormal Motor bilateral hand intrinsic atrophy and Other motor tr Left bi, tri grip, 3- Left HF, KE , 2- ankle DF Musc/Skel:  Other no pain with AROM in BUE and BLE, or with neck movement, sitting balance fair minus Gen NAD   Assessment/Plan: 1. Functional deficits secondary to RIght pontomedullary hemorrhage with Left hemiparesis superimposed on severe diabetic neuropathy which require 3+ hours per  day of interdisciplinary therapy in a comprehensive inpatient rehab setting. Physiatrist is providing close team supervision and 24 hour management of active medical problems listed below. Physiatrist and rehab team continue to assess barriers to discharge/monitor patient progress toward functional and medical goals.  FIM: FIM - Bathing Bathing Steps Patient Completed: Chest;Left Arm;Abdomen;Right upper leg;Left upper leg Bathing: 3: Mod-Patient completes 5-7 43f 10 parts or 50-74%  FIM - Upper Body Dressing/Undressing Upper body dressing/undressing: 0: Wears gown/pajamas-no public clothing FIM - Lower Body Dressing/Undressing Lower body dressing/undressing: 0: Wears gown/pajamas-no public clothing  FIM - Toileting Toileting: 0: No continent bowel/bladder events this shift  FIM - Air cabin crew Transfers: 0-Activity did not occur  FIM - Control and instrumentation engineer Devices: Bed rails Bed/Chair Transfer: 3: Supine > Sit: Mod A (lifting assist/Pt. 50-74%/lift 2 legs;3: Sit > Supine: Mod A (lifting assist/Pt. 50-74%/lift 2 legs)  FIM - Locomotion: Wheelchair Distance: 20 Locomotion: Wheelchair: 1: Travels less than 50 ft with maximal assistance (Pt: 25 - 49%) FIM - Locomotion: Ambulation Locomotion: Ambulation Assistive Devices: Other (comment) (3 musketeers) Ambulation/Gait Assistance: 1: +2 Total assist Locomotion: Ambulation: 0: Activity did not occur  Comprehension Comprehension Mode: Auditory Comprehension: 5-Understands basic 90% of the time/requires cueing < 10% of the time  Expression Expression Mode: Verbal Expression: 4-Expresses basic 75 - 89% of the time/requires cueing 10 - 24% of the time. Needs helper to occlude trach/needs to repeat words.  Social Interaction Social Interaction: 4-Interacts appropriately 75 - 89% of the time - Needs redirection for appropriate language or to initiate interaction.  Problem Solving Problem Solving:  3-Solves basic 50 - 74% of the time/requires cueing 25 - 49% of the time  Memory Memory: 4-Recognizes or recalls 75 - 89% of the time/requires cueing 10 - 24% of the time  Medical Problem List and Plan:  1. 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 Latest cranial CT scan stable  2. DVT Prophylaxis/Anticoagulation: Subcutaneous heparin initiated for DVT prophylaxis 06/13/2014  3. Pain Management: Tylenol as needed. Monitor the increased mobility  4. Diabetes mellitus with peripheral neuropathy. Latest hemoglobin A1c 6.7. Check blood sugars a.c. and at bedtime.Tradjenta 5 mg daily. Patient on Levemir 15 units each bedtime and Onglyza 2.5 mg daily prior to admission. Plan to resume as needed  5. Neuropsych: This patient is not capable of making decisions on his own behalf.  6. Skin/Wound Care: Routine skin checks  7. Chronic renal insufficiency. Baseline creatinine 2.84. Followup chemistries show stable GFR ~20 8. Chronic diastolic congestive heart failure. Monitor for any signs of fluid overload. Patient on Lasix 80 mg Daily.  9. Hypertension. Norvasc 10 mg daily, Coreg 6.25 mg twice a day. Monitor with increased mobility  10. CAD with history of CABG. No chest pain or shortness of breath  11. Hyperlipidemia. Lipitor  12. GERD. Protonix   LOS (Days) 6 A FACE TO FACE EVALUATION WAS PERFORMED  KIRSTEINS,ANDREW E 06/20/2014, 8:02 AM

## 2014-06-20 NOTE — Patient Care Conference (Signed)
Inpatient RehabilitationTeam Conference and Plan of Care Update Date: 06/19/2014   Time: 10:50 AM    Patient Name: [name]      Medical Record Number: [id number]  Date of Birth: Apr 25, 1931 Sex: Male         Room/Bed: 4W23C/4W23C-01 Payor Info: Payor: [employer] MEDICARE / Plan: [place] MEDICARE HMO/PPO / Product Type: *No Product type* /    Admitting Diagnosis: R CVA  Admit Date/Time:  06/14/2014  5:42 PM Admission Comments: No comment available   Primary Diagnosis:  <principal problem not specified> Principal Problem: <principal problem not specified>  Patient Active Problem List   Diagnosis Date Noted  . ICH (intracerebral hemorrhage) 06/14/2014  . Stroke due to intracerebral hemorrhage 06/11/2014  . Atrial fibrillation 11/20/2013  . Chronic diastolic congestive heart failure 11/20/2013  . Atrial flutter 06/25/2013  . Long term (current) use of anticoagulants 06/25/2013  . CKD (chronic kidney disease) stage 5, GFR less than 15 ml/min 08/29/2012  . Pulmonary arterial hypertension 07/29/2012  . Anemia, iron deficiency 01/25/2012  . AV block, Mobitz 1 01/10/2012  . CARDIOMYOPATHY, ISCHEMIC 10/22/2010  . HEADACHE 01/23/2009  . DIABETIC PERIPHERAL NEUROPATHY 07/24/2008  . ABDOMINAL PAIN, RIGHT UPPER QUADRANT 10/23/2007  . DIABETES MELLITUS 06/24/2007  . HYPERLIPIDEMIA 06/24/2007  . CORONARY ARTERY DISEASE 06/24/2007  . OSTEOARTHRITIS 06/24/2007  . CEREBROVASCULAR DISEASE, HX OF 06/24/2007  . STATUS, OTHER TOE(S) AMPUTATION 06/24/2007  . HYPERTENSION 05/13/2007  . CORONARY ARTERY BYPASS GRAFT, HX OF 11/25/1994    Expected Discharge Date: Expected Discharge Date: 07/12/14  Team Members Present: Physician leading conference: Dr. Alysia Penna Social Worker Present: Ovidio Kin, LCSW;Jenny Reeves Musick, LCSW Nurse Present: Elliot Cousin, RN PT Present: Raylene Everts, PT;Blair Hobble, PT;Caroline Lacinda Axon, PT OT Present: Simonne Come, OT SLP Present: [name], SLP PPS  Coordinator present : Daiva Nakayama, RN, CRRN     Current Status/Progress Goal Weekly Team Focus  Medical   hx of non compliance, ? judgement  maintain med stability  improve cont   Bowel/Bladder   incontinent of B/B  mod assist for toileting  timed toileting q3 hr   Swallow/Nutrition/ Hydration             ADL's   total assist overalll, +2 for transfers and LB dressing at sit > stand level  min assist overall  bed mobility, Lt NMR, sit <> stand, transfers, attention to tasks   Mobility   Total to +2A overall  Min A overall  Anterior weight shift, functional transfers, bed mobility, NMR, postural stability   Communication   min assist for use of dysarthria strategies   supervision   carryover at the phrase/sentence level    Safety/Cognition/ Behavioral Observations  min-mod assist   supervision   memory, self monitoring, attention, awareness, problem solving    Pain   denies pain  report pain less than 3   monitor for pain q shift   Skin   red, swollen scrotom  barrier cream as needed  monitor and assess skin q shift    Rehab Goals Patient on target to meet rehab goals: Yes Rehab Goals Revised: none-this is pt's first [place] and progress notes for long and short-term goals.  Barriers to Discharge: has 12 kids but only 2 contacts    Possible Resolutions to Barriers:  cont rehab    Discharge Planning/Teaching Needs:  Pt's plan is to go to his home with family assisting him 24/7.  Dtr and son are unsure they can provide this  24/7 care.  Dtr wishes to give pt a week to see his progress before any d/c plan is set.  Pt's family can attend family education, as needed, and if pt is truly able to return home safely with 24/7 care.   Team Discussion:  Pt with left hemiparesis and seemed weaker to MD than he was the day before.  Pt has decreased endurance due to CHF and renal disease.  Pt has min assist goals, but may not be able to reach those.  He is total assist  +2 right now.  He has no sensation of bowel movement.  He had some coughing and is now full supervision with meals and no straws.   Revisions to Treatment Plan:  None as this is pt's first conference   Continued Need for Acute Rehabilitation Level of Care: The patient requires daily medical management by a physician with specialized training in physical medicine and rehabilitation for the following conditions: Daily direction of a multidisciplinary physical rehabilitation program to ensure safe treatment while eliciting the highest outcome that is of practical value to the patient.: Yes Daily medical management of patient stability for increased activity during participation in an intensive rehabilitation regime.: Yes Daily analysis of laboratory values and/or radiology reports with any subsequent need for medication adjustment of medical intervention for : Neurological problems  Thersea Manfredonia, Silvestre Mesi 06/20/2014, 2:19 PM

## 2014-06-20 NOTE — Plan of Care (Signed)
Problem: RH BLADDER ELIMINATION Goal: RH STG MANAGE BLADDER WITH ASSISTANCE STG Manage Bladder With min Assistance  Outcome: Not Progressing Total assist . Diaper. Condom cath at night

## 2014-06-20 NOTE — Progress Notes (Signed)
Speech Language Pathology Daily Session Note  Patient Details  Name: [name] MRN: [id number] Date of Birth: 1931/03/03  Today's Date: 06/20/2014 SLP Individual Time: 1115-1208 SLP Individual Time Calculation (min): 53 min  Short Term Goals: Week 1: SLP Short Term Goal 1 (Week 1): Patient will utilize speech strategies (slow down, over-articulate, pause between words) to achieve 75% intelligibility at phrase level, with minimal cues. SLP Short Term Goal 2 (Week 1): Patient will demonstrate anticipatory awareness by describing impact of speech and physical deficits, with moderate cues. SLP Short Term Goal 3 (Week 1): Patient will recall and demonstrate understanding of safety precautions and strategies learned in PT, OT, SLP treatment sessions, with minimal cues. SLP Short Term Goal 4 (Week 1): Patient will solve basic level functional problems wtih 80% accuracy. SLP Short Term Goal 5 (Week 1): Pt will utilize his recommended swallowing precautions to minimize overt s/s of aspiration with presentations of his currently prescribed diet.   Skilled Therapeutic Interventions:  Pt was seen for skilled speech therapy targeting goals for dysarthria and dysphagia.  Upon arrival, pt upright in wheelchair, awake, alert, and agreeable to participate in [name].  RN was present and administering meds with thin liquids via straw with pt exhibiting an immediate cough following.  SLP reviewed and reinforced recommendations for no straws.  No overt s/s of aspiration noted with cup sips of  Thins when RN was administering meds.  SLP also completed skilled observations with presentation of pt's currently prescribed diet with pt exhibiting no overt s/s of aspiration with solid or liquid consistencies.  Furthermore, pt demonstrated adequate clearance of solid consistencies with no significant residuals noted in the oral cavity.  Additionally, SLP facilitated the session with trials of oral motor exercises to improve  left labial, buccal, and lingual weakness. Pt benefited from min assist verbal and visual cues to return demonstration of targeted exercises.  During loosely structured conversations with SLP, pt was noted with ~75% speech intelligibility at the sentence level with min cuing for increased vocal intensity and slow rate.  Continue per current plan of care.   FIM:  Comprehension Comprehension Mode: Auditory Comprehension: 5-Understands basic 90% of the time/requires cueing < 10% of the time Expression Expression Mode: Verbal Expression: 4-Expresses basic 75 - 89% of the time/requires cueing 10 - 24% of the time. Needs helper to occlude trach/needs to repeat words. Social Interaction Social Interaction: 3-Interacts appropriately 50 - 74% of the time - May be physically or verbally inappropriate. Problem Solving Problem Solving: 3-Solves basic 50 - 74% of the time/requires cueing 25 - 49% of the time Memory Memory: 3-Recognizes or recalls 50 - 74% of the time/requires cueing 25 - 49% of the time FIM - Eating Eating Activity: 5: Needs verbal cues/supervision  Pain Pain Assessment Pain Assessment: No/denies pain  Therapy/Group: Individual Therapy  [name], Selinda Orion 06/20/2014, 12:35 PM

## 2014-06-20 NOTE — Progress Notes (Signed)
Social Work Patient ID: [name], male   DOB: [birth year], 80 y.o.   MRN: [id number]  CSW spoke with pt to give him the update from team conference of 07-12-14 d/c date we are targeting.  Pt gave CSW permission to let his dtr know this, but told CSW that he is "working out my own problems."  He does not want his children involved in making his d/c decisions.  CSW then called to update dtr and we left things that we would see pt's progress over the next week and then discuss with pt his options for d/c disposition.  CSW noted that MD H&P states pt is not able to make his own decisions, so CSW will f/u with MD about this and then talk with pt again.

## 2014-06-20 NOTE — Progress Notes (Signed)
1430 9/24 15 nsg Patient complained of pain over left occipital area when turning his head to the left side claims 10/10 but the changed to 6/10 ; pt not in distress. Tylenol given initially but did not work. PA called one time dose of ultram given pt claims it is getting better. Will continue to monitor.

## 2014-06-21 ENCOUNTER — Inpatient Hospital Stay (HOSPITAL_COMMUNITY): Payer: Medicare Other | Admitting: Speech Pathology

## 2014-06-21 ENCOUNTER — Inpatient Hospital Stay (HOSPITAL_COMMUNITY): Payer: Medicare HMO | Admitting: Physical Therapy

## 2014-06-21 ENCOUNTER — Inpatient Hospital Stay (HOSPITAL_COMMUNITY): Payer: Medicare HMO | Admitting: Occupational Therapy

## 2014-06-21 LAB — GLUCOSE, CAPILLARY
GLUCOSE-CAPILLARY: 192 mg/dL — AB (ref 70–99)
GLUCOSE-CAPILLARY: 197 mg/dL — AB (ref 70–99)
Glucose-Capillary: 114 mg/dL — ABNORMAL HIGH (ref 70–99)
Glucose-Capillary: 186 mg/dL — ABNORMAL HIGH (ref 70–99)

## 2014-06-21 NOTE — Progress Notes (Signed)
Speech Language Pathology Daily Session Note  Patient Details  Name: [name] MRN: [id number] Date of Birth: [birth year]  Today's Date: 06/21/2014 SLP Individual Time: 1305-1405 SLP Individual Time Calculation (min): 60 min  Short Term Goals: Week 1: SLP Short Term Goal 1 (Week 1): Patient will utilize speech strategies (slow down, over-articulate, pause between words) to achieve 75% intelligibility at phrase level, with minimal cues. SLP Short Term Goal 2 (Week 1): Patient will demonstrate anticipatory awareness by describing impact of speech and physical deficits, with moderate cues. SLP Short Term Goal 3 (Week 1): Patient will recall and demonstrate understanding of safety precautions and strategies learned in PT, OT, SLP treatment sessions, with minimal cues. SLP Short Term Goal 4 (Week 1): Patient will solve basic level functional problems wtih 80% accuracy. SLP Short Term Goal 5 (Week 1): Pt will utilize his recommended swallowing precautions to minimize overt s/s of aspiration with presentations of his currently prescribed diet.   Skilled Therapeutic Interventions:  Pt was seen for skilled speech therapy targeting cognitive goals.  Upon arrival, pt was upright in wheelchair, lethargic but agreeable to participate in [place].  SLP facilitated the session with a 4 step sequencing task targeting planning and organization for basic problem solving.  Pt required overall max assist to complete task due to decreased selective attention in a moderately distracting environment.  Furthermore, pt required mod assist for use of increased vocal intensity to improve speech intelligibility in a loud environment.  SLP further facilitated the session with a structured new learning activity targeting initiation and processing speed with pt requiring max faded to mod assist verbal cues to complete task for ~75% accuracy.  Continue per current plan of care.     FIM:  Comprehension Comprehension Mode:  Auditory Comprehension: 5-Understands basic 90% of the time/requires cueing < 10% of the time Expression Expression Mode: Verbal Expression: 5-Expresses basic 90% of the time/requires cueing < 10% of the time. Social Interaction Social Interaction: 3-Interacts appropriately 50 - 74% of the time - May be physically or verbally inappropriate. Problem Solving Problem Solving: 3-Solves basic 50 - 74% of the time/requires cueing 25 - 49% of the time Memory Memory: 3-Recognizes or recalls 50 - 74% of the time/requires cueing 25 - 49% of the time  Pain Pain Assessment Pain Assessment: No/denies pain  Therapy/Group: Individual Therapy  [name], M.A. CCC-SLP  Sanad Fearnow, Selinda Orion 06/21/2014, 3:18 PM

## 2014-06-21 NOTE — Progress Notes (Addendum)
Occupational Therapy Session Note  Patient Details  Name: [name] MRN: [id number] Date of Birth: September 30, 1930  Today's Date: 06/21/2014 OT Individual Time: 0800-0900 OT Individual Time Calculation (min): 60 min   Short Term Goals: Week 1:  OT Short Term Goal 1 (Week 1): Pt will perform toilet transfer with max A OT Short Term Goal 2 (Week 1): Pt will engage in 10 minutes of functional activity before requiring rest break in order to increase endurance. OT Short Term Goal 3 (Week 1): Pt maintain dynamic sitting balance with supervision for 5 minutes during activity. OT Short Term Goal 4 (Week 1): Pt will perform LB dressing with Max A.   Skilled Therapeutic Interventions/Progress Updates:  Patient resting in bed upon arrival.  Engaged in self care retraining to include sponge bath and dressing.  Focused session on bed mobility, forced use of LUR & LLE, dynamic sitting balance, sit><stand, postural control and attention to left visual field and left side of body.  RN arrived during bath of peri area and buttocks to perform skin check, provide intervention and assist with this portion of task performed by rolling.  Patient attempted to wash front peri area with HOB up however not thorough.  Completed UB and LB bath & dressing seated EOB and stand with +2 to pull up pants.  Patient is very slow moving, resistant to forward weight shifts to prepare for sit><stand and has pusher tendencies.  Patient requested back to bed to rest.  Therapy Documentation Precautions:  Precautions Precautions: Fall Restrictions Weight Bearing Restrictions: No Pain: Denies pain ADL: See FIM for current functional status  Therapy/Group: Individual Therapy  [name] 06/21/2014, 8:26 AM

## 2014-06-21 NOTE — Progress Notes (Signed)
Physical Therapy Session Note  Patient Details  Name: [name] MRN: [id number] Date of Birth: May 29, 1931  Today's Date: 06/21/2014 PT Individual Time: 1016-1119 PT Individual Time Calculation (min): 63 min   Short Term Goals: Week 1:  PT Short Term Goal 1 (Week 1): Pt will demonstrate rolling L in bed req min A PT Short Term Goal 2 (Week 1): Pt will demonstrate supine to sit req mod A PT Short Term Goal 3 (Week 1): Pt will demonstrate stand-pivot transfer bed to w/c req mod A.  PT Short Term Goal 4 (Week 1): Pt will self propel manual w/c x 50' req min A PT Short Term Goal 5 (Week 1): Pt will tolerate ambulation with 2 person assist.   Skilled Therapeutic Interventions/Progress Updates:    Pt received semi reclined in bed; agreeable to therapy. Sessio focused on functional transfers and standing. Pt performed supine>sit (to R side) with multimodal cueing for sequencing, technique and mod A to lift trunk during R side lying>sit. Once seated EOB, pt required verbal cueing, manual facilitation of full anterior weight shift for scooting forward. Performed lateral scooting transfer from bed>w/c (to L side) with max A, multimodal cueing for full anterior weight shift. Transported pt to treatment gym with total A for energy conservation. In gym, pt performed multiple sit<>stand transfers from w/c with RE support at parallel bars, mod A, and tactile/verbal cueing for anterior weight shift. Pt performed dynamic standing x5 trials (each trial x15-45 seconds in duration) with cueing for bilat hip/L knee extension (pt initially with effective return demonstration but unable to sustain for >5 seconds due to fatigue). Progressed to standing with lateral weight shifting, manual stabilization of L knee (to maintain extension). While standing with RUE support at parallel bars with mod-max A for stability in standing, pt able to successfully take small step with LLE during 2/2 attempts; however, pt unable to  initiate RLE advancement due to fear of shifting weight to L knee, per pt. Transitioned to blocked practice of lateral scooting transfers from w/c<>mat table with max A, cueing as described above. RUE reaching target (arm of the chair this PT was sitting in) was most effective in eliciting anterior weight shift. At this time, pt reporting need to have BM; therefore, transported pt to room in w/c, where pt transferred from w/c<>toilet using Stedy lift requiring +2A of RN and this PT. Departed with pt seated on toilet with RN present and pt in no apparent distress. Per RN, Charlaine Dalton did not appear to be as safe as [address] for nursing staff. Will modify safety plan.  Therapy Documentation Precautions:  Precautions Precautions: Fall Restrictions Weight Bearing Restrictions: No Pain: Pain Assessment Pain Assessment: No/denies pain  See FIM for current functional status  Therapy/Group: Individual Therapy  Lavelle Berland, Malva Cogan 06/21/2014, 12:29 PM

## 2014-06-21 NOTE — Progress Notes (Signed)
Subjective/Complaints: 80 y.o. right-handed male with history diabetes mellitus with peripheral neuropathy, CAD status post CABG, atrial flutter on chronic Coumadin, chronic renal insufficiency with baseline creatinine 5.00, chronic diastolic congestive heart failure. Independent with occasional cane and driving living with family. Admitted 06/11/2049 of left-sided weakness and slurred speech as well as recent fall but denies striking his head. Cranial CT scan shows a right pontomedullary 14 x 22 mm oval focus consistent with acute hemorrhage. INR on admission of 9.38 and systolic blood pressure 182. Placed on intravenous Lopressor and Coumadin was reversed. Followup cranial CT scan unchanged. 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 Tolerating a regular consistency diet. Coumadin remains on hold with cardiac rate control.  No new issues overnite, per RN slept ok  Review of Systems - Negative except weak on left  Objective: Vital Signs: Blood pressure 145/62, pulse 48, temperature 97.5 F (36.4 C), temperature source Oral, resp. rate 18, weight 81.149 kg (178 lb 14.4 oz), SpO2 95.00%. No results found. Results for orders placed during the hospital encounter of 06/14/14 (from the past 72 hour(s))  GLUCOSE, CAPILLARY     Status: Abnormal   Collection Time    06/18/14 11:09 AM      Result Value Ref Range   Glucose-Capillary 142 (*) 70 - 99 mg/dL   Comment 1 Notify RN    GLUCOSE, CAPILLARY     Status: Abnormal   Collection Time    06/18/14  4:29 PM      Result Value Ref Range   Glucose-Capillary 163 (*) 70 - 99 mg/dL  GLUCOSE, CAPILLARY     Status: Abnormal   Collection Time    06/18/14  8:57 PM      Result Value Ref Range   Glucose-Capillary 133 (*) 70 - 99 mg/dL  GLUCOSE, CAPILLARY     Status: None   Collection Time    06/19/14  6:54 AM      Result Value Ref Range   Glucose-Capillary 93  70 - 99 mg/dL   Comment 1 Notify RN    GLUCOSE, CAPILLARY      Status: Abnormal   Collection Time    06/19/14 12:05 PM      Result Value Ref Range   Glucose-Capillary 139 (*) 70 - 99 mg/dL   Comment 1 Notify RN    GLUCOSE, CAPILLARY     Status: Abnormal   Collection Time    06/19/14  4:45 PM      Result Value Ref Range   Glucose-Capillary 119 (*) 70 - 99 mg/dL  GLUCOSE, CAPILLARY     Status: Abnormal   Collection Time    06/19/14  9:54 PM      Result Value Ref Range   Glucose-Capillary 162 (*) 70 - 99 mg/dL  GLUCOSE, CAPILLARY     Status: Abnormal   Collection Time    06/20/14  6:59 AM      Result Value Ref Range   Glucose-Capillary 114 (*) 70 - 99 mg/dL  GLUCOSE, CAPILLARY     Status: Abnormal   Collection Time    06/20/14 11:18 AM      Result Value Ref Range   Glucose-Capillary 160 (*) 70 - 99 mg/dL   Comment 1 Notify RN    GLUCOSE, CAPILLARY     Status: Abnormal   Collection Time    06/20/14  4:46 PM      Result Value Ref Range   Glucose-Capillary 186 (*) 70 - 99 mg/dL  GLUCOSE, CAPILLARY     Status: Abnormal   Collection Time    06/20/14  8:36 PM      Result Value Ref Range   Glucose-Capillary 168 (*) 70 - 99 mg/dL  GLUCOSE, CAPILLARY     Status: Abnormal   Collection Time    06/21/14  6:46 AM      Result Value Ref Range   Glucose-Capillary 114 (*) 70 - 99 mg/dL     HEENT: normal Cardio: RRR and no murmur Resp: CTA B/L and unlabored GI: BS positive and NT, ND Extremity:  No Edema Skin:   Other Left transerve healed breast incision at nipple level, left upper back incion well healed, dry skin both feet without open areas Neuro: Lethargic, Flat, Cranial Nerve Abnormalities Left central 7, Abnormal Sensory decreased in both feet, Abnormal Motor bilateral hand intrinsic atrophy and Other motor tr Left bi, tri grip, 3- Left HF, KE , 2- ankle DF Musc/Skel:  Other no pain with AROM in BUE and BLE, or with neck movement, sitting balance fair minus Gen NAD   Assessment/Plan: 1. Functional deficits secondary to RIght  pontomedullary hemorrhage with Left hemiparesis superimposed on severe diabetic neuropathy which require 3+ hours per day of interdisciplinary therapy in a comprehensive inpatient rehab setting. Physiatrist is providing close team supervision and 24 hour management of active medical problems listed below. Physiatrist and rehab team continue to assess barriers to discharge/monitor patient progress toward functional and medical goals.  FIM: FIM - Bathing Bathing Steps Patient Completed: Chest;Left Arm;Abdomen;Right upper leg;Left upper leg Bathing: 3: Mod-Patient completes 5-7 96f 10 parts or 50-74%  FIM - Upper Body Dressing/Undressing Upper body dressing/undressing steps patient completed: Put head through opening of pull over shirt/dress;Pull shirt over trunk Upper body dressing/undressing: 2: Max-Patient completed 25-49% of tasks FIM - Lower Body Dressing/Undressing Lower body dressing/undressing: 1: Two helpers  FIM - Toileting Toileting: 0: No continent bowel/bladder events this shift  FIM - Air cabin crew Transfers: 0-Activity did not occur  FIM - Control and instrumentation engineer Devices: Arm rests Bed/Chair Transfer: 3: Supine > Sit: Mod A (lifting assist/Pt. 50-74%/lift 2 legs;1: Two helpers  FIM - Locomotion: Wheelchair Distance: 40 Locomotion: Wheelchair: 1: Travels less than 50 ft with maximal assistance (Pt: 25 - 49%) FIM - Locomotion: Ambulation Locomotion: Ambulation Assistive Devices: Other (comment) (3 musketeers) Ambulation/Gait Assistance: 1: +2 Total assist Locomotion: Ambulation: 0: Activity did not occur  Comprehension Comprehension Mode: Auditory Comprehension: 5-Understands basic 90% of the time/requires cueing < 10% of the time  Expression Expression Mode: Verbal Expression: 4-Expresses basic 75 - 89% of the time/requires cueing 10 - 24% of the time. Needs helper to occlude trach/needs to repeat words.  Social Interaction Social  Interaction: 3-Interacts appropriately 50 - 74% of the time - May be physically or verbally inappropriate.  Problem Solving Problem Solving: 3-Solves basic 50 - 74% of the time/requires cueing 25 - 49% of the time  Memory Memory: 3-Recognizes or recalls 50 - 74% of the time/requires cueing 25 - 49% of the time  Medical Problem List and Plan:  1. 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 Latest cranial CT scan stable  2. DVT Prophylaxis/Anticoagulation: Subcutaneous heparin initiated for DVT prophylaxis 06/13/2014  3. Pain Management: Tylenol as needed. Monitor the increased mobility  4. Diabetes mellitus with peripheral neuropathy. Latest hemoglobin A1c 6.7. Check blood sugars a.c. and at bedtime.Tradjenta 5 mg daily. Patient on Levemir 15 units each bedtime and Onglyza 2.5  mg daily prior to admission. Plan to resume as needed  5. Neuropsych: This patient is not capable of making decisions on his own behalf.  6. Skin/Wound Care: Routine skin checks  7. Chronic renal insufficiency. Baseline creatinine 2.84. Followup chemistries show stable GFR ~20 8. Chronic diastolic congestive heart failure. Monitor for any signs of fluid overload. Patient on Lasix 80 mg Daily.  9. Hypertension. Norvasc 10 mg daily, Coreg 6.25 mg twice a day. Monitor with increased mobility  10. CAD with history of CABG. No chest pain or shortness of breath  11. Hyperlipidemia. Lipitor  12. GERD. Protonix   LOS (Days) 7 A FACE TO FACE EVALUATION WAS PERFORMED  KIRSTEINS,ANDREW E 06/21/2014, 7:10 AM

## 2014-06-22 ENCOUNTER — Inpatient Hospital Stay (HOSPITAL_COMMUNITY): Payer: Medicare HMO | Admitting: Physical Therapy

## 2014-06-22 DIAGNOSIS — R5381 Other malaise: Secondary | ICD-10-CM

## 2014-06-22 DIAGNOSIS — I619 Nontraumatic intracerebral hemorrhage, unspecified: Secondary | ICD-10-CM

## 2014-06-22 LAB — GLUCOSE, CAPILLARY
Glucose-Capillary: 115 mg/dL — ABNORMAL HIGH (ref 70–99)
Glucose-Capillary: 188 mg/dL — ABNORMAL HIGH (ref 70–99)
Glucose-Capillary: 197 mg/dL — ABNORMAL HIGH (ref 70–99)
Glucose-Capillary: 143 mg/dL — ABNORMAL HIGH (ref 70–99)

## 2014-06-22 MED ORDER — HYDROCERIN EX CREA
TOPICAL_CREAM | Freq: Two times a day (BID) | CUTANEOUS | Status: DC
  Administered 2014-06-22 (×2): via TOPICAL
  Administered 2014-06-23: 1 via TOPICAL
  Administered 2014-06-23 – 2014-06-27 (×9): via TOPICAL
  Administered 2014-06-28: 1 via TOPICAL
  Administered 2014-06-28 – 2014-07-01 (×6): via TOPICAL
  Administered 2014-07-02: 1 via TOPICAL
  Administered 2014-07-02 – 2014-07-07 (×9): via TOPICAL
  Administered 2014-07-07 – 2014-07-08 (×2): 1 via TOPICAL
  Administered 2014-07-08 – 2014-07-12 (×8): via TOPICAL
  Filled 2014-06-22: qty 113

## 2014-06-22 NOTE — Plan of Care (Signed)
Problem: RH BOWEL ELIMINATION Goal: RH STG MANAGE BOWEL W/MEDICATION W/ASSISTANCE Manage with mod assist Outcome: Progressing Small incont BM in brief 9/26

## 2014-06-22 NOTE — Progress Notes (Signed)
Physical Therapy Session Note  Patient Details  Name: [name] MRN: [id number] Date of Birth: 20-Dec-1930  Today's Date: 06/22/2014 PT Individual Time: 7425-9563 PT Individual Time Calculation (min): 53 min   Short Term Goals: Week 1:  PT Short Term Goal 1 (Week 1): Pt will demonstrate rolling L in bed req min A PT Short Term Goal 2 (Week 1): Pt will demonstrate supine to sit req mod A PT Short Term Goal 3 (Week 1): Pt will demonstrate stand-pivot transfer bed to w/c req mod A.  PT Short Term Goal 4 (Week 1): Pt will self propel manual w/c x 50' req min A PT Short Term Goal 5 (Week 1): Pt will tolerate ambulation with 2 person assist.   Skilled Therapeutic Interventions/Progress Updates:    1:1. Pt received semi reclined in bed finishing lunch; accompanied by RN. Pt reporting headache (declining medication at this time) but agreeable to therapy. Session focused on increasing pt independence with functional transfers and bed mobility. Pt performed supine>sit (to L side for LUE weightbearing) with max A for bilat LE management and to lift trunk, mod cueing for technique with decreased initiation, increased time. Seated EOM, pt required max A and 75% cueing for scooting to EOB. Performed lateral scooting transfers from bed<>w/c without slide board and from w/c<>mat table with slide board with max A, Dycem under R foot to maintain position on ground, tactile cueing at bilat ribcage to facilitate active lifting, and max cueing for full anterior weight shift (most effective cue was reaching target for RUE). Cueing also focused on increased use of bilat LE's during transfers (as opposed to excessive pushing/pulling with RUE). Pt demonstrated increased fatigue, labored breathing with minimal activity and required frequent, prolonged rest breaks. See below for vital signs taken during treatment. Session ended in pt room, where pt was left semi reclined in bed with 3 bed rails up, bed alarm on, and all  needs within reach. RN made aware of change in pt presentation (decreased initiation, vital signs, and need for frequent, prolonged rest breaks during current session).   Therapy Documentation Precautions:  Precautions Precautions: Fall Restrictions Weight Bearing Restrictions: No Vital Signs: Therapy Vitals Temp: 98.8 F Temp src: Oral Pulse Rate: 53 Resp: 30 BP: 148/69 mmHg Patient Position (if appropriate): sitting Oxygen Therapy SpO2: 95 % O2 Device: None (Room air) Pain: Pain Assessment Pain Assessment: 0-10 Pain Score: Asleep Pain Type: Acute pain Pain Location: Head Pain Orientation: Left Pain Descriptors / Indicators: Headache Pain Onset: On-going Pain Intervention(s): Medication (See eMAR) Multiple Pain Sites: No  See FIM for current functional status  Therapy/Group: Individual Therapy  Novie Maggio, Malva Cogan 06/22/2014, 5:03 PM

## 2014-06-22 NOTE — Progress Notes (Signed)
[name] is a 80 y.o. male Feb 03, 1931 [id number]  Subjective: No new complaints. Pleasant and engaged but min verbal. Denies new problems. Slept well. Feeling OK. RN requests skin cream for feet  Objective: Vital signs in last 24 hours: Temp:  [97.7 F (36.5 C)-98.9 F (37.2 C)] 98.9 F (37.2 C) (09/26 0605) Pulse Rate:  [49-61] 50 (09/26 0605) Resp:  [16-18] 16 (09/26 0605) BP: (125-153)/(58-66) 125/58 mmHg (09/26 0605) SpO2:  [98 %-99 %] 98 % (09/26 0605) Weight change:  Last BM Date: 06/21/14  Intake/Output from previous day: 09/25 0701 - 09/26 0700 In: 1020 [P.O.:1020] Out: 750 [Urine:750]  Physical Exam General: No apparent distress    Lungs: Normal effort. Lungs clear to auscultation, no crackles or wheezes. Cardiovascular: Regular rate and rhythm, no edema Skin: Dry skin B feet/toes   Lab Results: BMET    Component Value Date/Time   NA 135* 06/17/2014 1210   K 4.5 06/17/2014 1210   CL 100 06/17/2014 1210   CO2 19 06/17/2014 1210   GLUCOSE 153* 06/17/2014 1210   BUN 52* 06/17/2014 1210   CREATININE 3.00* 06/17/2014 1210   CREATININE 1.89* 12/20/2011 1233   CALCIUM 9.7 06/17/2014 1210   GFRNONAA 18* 06/17/2014 1210   GFRAA 21* 06/17/2014 1210   CBC    Component Value Date/Time   WBC 4.9 06/17/2014 1210   RBC 3.80* 06/17/2014 1210   HGB 10.7* 06/17/2014 1210   HCT 32.0* 06/17/2014 1210   PLT 97* 06/17/2014 1210   MCV 84.2 06/17/2014 1210   MCH 28.2 06/17/2014 1210   MCHC 33.4 06/17/2014 1210   RDW 17.0* 06/17/2014 1210   LYMPHSABS 0.9 06/17/2014 1210   MONOABS 0.4 06/17/2014 1210   EOSABS 0.0 06/17/2014 1210   BASOSABS 0.0 06/17/2014 1210   CBG's (last 3):   Recent Labs  06/21/14 1639 06/21/14 2103 06/22/14 0724  GLUCAP 197* 186* 115*   LFT's Lab Results  Component Value Date   ALT 10 06/17/2014   AST 20 06/17/2014   ALKPHOS 94 06/17/2014   BILITOT 0.5 06/17/2014    Studies/Results: No results found.  Medications:  I have reviewed the patient's  current medications. Scheduled Medications: . amLODipine  10 mg Oral Daily  . antiseptic oral rinse  7 mL Mouth Rinse BID  . atorvastatin  20 mg Oral q1800  . carvedilol  6.25 mg Oral BID  . furosemide  80 mg Oral Daily  . heparin subcutaneous  5,000 Units Subcutaneous Q12H  . insulin aspart  0-15 Units Subcutaneous TID WC  . linagliptin  5 mg Oral Daily  . pantoprazole  40 mg Oral Daily  . polyethylene glycol  17 g Oral Daily  . potassium chloride SA  20 mEq Oral BID  . senna-docusate  1 tablet Oral BID   PRN Medications: acetaminophen, bisacodyl, ondansetron (ZOFRAN) IV, ondansetron, sorbitol  Assessment/Plan: Active Problems:   ICH (intracerebral hemorrhage) 1. 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 Latest cranial CT scan stable  2. DVT Prophylaxis/Anticoagulation: Subcutaneous heparin initiated for DVT prophylaxis 06/13/2014  3. Pain Management: Tylenol as needed. Monitor the increased mobility  4. Diabetes mellitus with peripheral neuropathy. Latest hemoglobin A1c 6.7. Check blood sugars a.c. and at bedtime.Tradjenta 5 mg daily. Patient on Levemir 15 units each bedtime and Onglyza 2.5 mg daily prior to admission. resume as needed  5. Neuropsych: This patient is not capable of making decisions on his own behalf.  6. Skin/Wound Care: Routine skin checks  7. Chronic renal insufficiency. Baseline creatinine 2.84. Followup chemistries show stable GFR ~20  8. Chronic diastolic congestive heart failure. Monitor for any signs of fluid overload. Patient on Lasix 80 mg Daily.  9. Hypertension. Norvasc 10 mg daily, Coreg 6.25 mg twice a day. Monitor with increased mobility  10. CAD with history of CABG. No chest pain or shortness of breath  11. Hyperlipidemia. Lipitor  12. GERD. Protonix   Length of stay, days: 8    Parker Bellino A. [name], MD 06/22/2014, 10:27 AM

## 2014-06-22 NOTE — Plan of Care (Signed)
Problem: RH BLADDER ELIMINATION Goal: RH STG MANAGE BLADDER WITH ASSISTANCE STG Manage Bladder With mod Assistance  Outcome: Not Progressing Pt remains incont. Of bladder. Condom cath HS; timed toileting inpalce

## 2014-06-23 ENCOUNTER — Encounter (HOSPITAL_COMMUNITY): Payer: Medicare Other

## 2014-06-23 LAB — GLUCOSE, CAPILLARY
Glucose-Capillary: 110 mg/dL — ABNORMAL HIGH (ref 70–99)
Glucose-Capillary: 135 mg/dL — ABNORMAL HIGH (ref 70–99)
Glucose-Capillary: 138 mg/dL — ABNORMAL HIGH (ref 70–99)
Glucose-Capillary: 176 mg/dL — ABNORMAL HIGH (ref 70–99)

## 2014-06-23 NOTE — Progress Notes (Signed)
Occupational Therapy Session Note  Patient Details  Name: [name] MRN: [id number] Date of Birth: January 14, 1931  Today's Date: 06/23/2014 OT Individual Time: 1045-1130 OT Individual Time Calculation (min): 45 min    Short Term Goals: Week 1:  OT Short Term Goal 1 (Week 1): Pt will perform toilet transfer with max A OT Short Term Goal 2 (Week 1): Pt will engage in 10 minutes of functional activity before requiring rest break in order to increase endurance. OT Short Term Goal 3 (Week 1): Pt maintain dynamic sitting balance with supervision for 5 minutes during activity. OT Short Term Goal 4 (Week 1): Pt will perform LB dressing with Max A.   Skilled Therapeutic Interventions/Progress Updates:    Pt resting in bed upon arrival.  Pt incontinent of bowel and required tot A for hygiene.  Pt engaged in BADL retraining including LB bathing at bed level, UB bathing seated in w/c, and UB dressing seated in w/c.  Pt did not have any pants available.  Pt required max A for rolling right and left to facilitate therapist performing hygiene after incontinent episode.  Pt required max A for supine->sit and [place] transfer to w/c.  Pt completed UB bathing and dressing seated in w/c at sink.  Pt required mod verbal cues to initiate tasks. Focus on activity tolerance, bed mobility, sitting balance, transfers, compensatory techniques, and safety awareness.  Therapy Documentation Precautions:  Precautions Precautions: Fall Restrictions Weight Bearing Restrictions: No   Pain:  No c/o pain  See FIM for current functional status  Therapy/Group: Individual Therapy  Leroy Libman 06/23/2014, 12:18 PM

## 2014-06-23 NOTE — Progress Notes (Signed)
[name] is a 80 y.o. male Oct 15, 1930 [id number]  Subjective: No new complaints. Pleasant and engaged but min verbal. Denies new problems. Slept well. Feeling OK.   Objective: Vital signs in last 24 hours: Temp:  [97.9 F (36.6 C)-98.8 F (37.1 C)] 98.1 F (36.7 C) (09/27 0627) Pulse Rate:  [51-53] 51 (09/27 0627) Resp:  [18-30] 18 (09/27 0627) BP: (136-148)/(68-73) 136/73 mmHg (09/27 0627) SpO2:  [95 %-100 %] 100 % (09/27 0627) Weight change:  Last BM Date: 06/22/14  Intake/Output from previous day: 09/26 0701 - 09/27 0700 In: 480 [P.O.:480] Out: 350 [Urine:350]  Physical Exam General: No apparent distress    Lungs: Normal effort. Lungs clear to auscultation, no crackles or wheezes. Cardiovascular: Regular rate and rhythm, no edema   Lab Results: BMET    Component Value Date/Time   NA 135* 06/17/2014 1210   K 4.5 06/17/2014 1210   CL 100 06/17/2014 1210   CO2 19 06/17/2014 1210   GLUCOSE 153* 06/17/2014 1210   BUN 52* 06/17/2014 1210   CREATININE 3.00* 06/17/2014 1210   CREATININE 1.89* 12/20/2011 1233   CALCIUM 9.7 06/17/2014 1210   GFRNONAA 18* 06/17/2014 1210   GFRAA 21* 06/17/2014 1210   CBC    Component Value Date/Time   WBC 4.9 06/17/2014 1210   RBC 3.80* 06/17/2014 1210   HGB 10.7* 06/17/2014 1210   HCT 32.0* 06/17/2014 1210   PLT 97* 06/17/2014 1210   MCV 84.2 06/17/2014 1210   MCH 28.2 06/17/2014 1210   MCHC 33.4 06/17/2014 1210   RDW 17.0* 06/17/2014 1210   LYMPHSABS 0.9 06/17/2014 1210   MONOABS 0.4 06/17/2014 1210   EOSABS 0.0 06/17/2014 1210   BASOSABS 0.0 06/17/2014 1210   CBG's (last 3):    Recent Labs  06/22/14 1617 06/22/14 2041 06/23/14 0856  GLUCAP 197* 143* 110*   LFT's Lab Results  Component Value Date   ALT 10 06/17/2014   AST 20 06/17/2014   ALKPHOS 94 06/17/2014   BILITOT 0.5 06/17/2014    Studies/Results: No results found.  Medications:  I have reviewed the patient's current medications. Scheduled Medications: . amLODipine   10 mg Oral Daily  . antiseptic oral rinse  7 mL Mouth Rinse BID  . atorvastatin  20 mg Oral q1800  . carvedilol  6.25 mg Oral BID  . furosemide  80 mg Oral Daily  . heparin subcutaneous  5,000 Units Subcutaneous Q12H  . hydrocerin   Topical BID  . insulin aspart  0-15 Units Subcutaneous TID WC  . linagliptin  5 mg Oral Daily  . pantoprazole  40 mg Oral Daily  . polyethylene glycol  17 g Oral Daily  . potassium chloride SA  20 mEq Oral BID  . senna-docusate  1 tablet Oral BID   PRN Medications: acetaminophen, bisacodyl, ondansetron (ZOFRAN) IV, ondansetron, sorbitol  Assessment/Plan: Active Problems:   ICH (intracerebral hemorrhage) 1. 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 Latest cranial CT scan stable  2. DVT Prophylaxis/Anticoagulation: Subcutaneous heparin initiated for DVT prophylaxis 06/13/2014  3. Pain Management: Tylenol as needed. Monitor the increased mobility  4. Diabetes mellitus with peripheral neuropathy. Latest hemoglobin A1c 6.7. Check blood sugars a.c. and at bedtime.Tradjenta 5 mg daily. Patient on Levemir 15 units each bedtime and Onglyza 2.5 mg daily prior to admission. resume as needed  5. Neuropsych: This patient is not capable of making decisions on his own behalf.  6. Skin/Wound Care: Routine skin checks  7. Chronic  renal insufficiency. Baseline creatinine 2.84. Followup chemistries show stable GFR ~20  8. Chronic diastolic congestive heart failure. Monitor for any signs of fluid overload. Patient on Lasix 80 mg Daily.  9. Hypertension. Norvasc 10 mg daily, Coreg 6.25 mg twice a day. Monitor with increased mobility  10. CAD with history of CABG. No chest pain or shortness of breath  11. Hyperlipidemia. Lipitor  12. GERD. Protonix   Length of stay, days: 9    Valerie A. [name], MD 06/23/2014, 10:39 AM

## 2014-06-23 NOTE — Progress Notes (Signed)
[name] has refused his meals today saying he dose not feel like eating.

## 2014-06-24 ENCOUNTER — Ambulatory Visit (HOSPITAL_COMMUNITY): Payer: Medicare Other | Admitting: Speech Pathology

## 2014-06-24 ENCOUNTER — Inpatient Hospital Stay (HOSPITAL_COMMUNITY): Payer: Medicare HMO | Admitting: Occupational Therapy

## 2014-06-24 ENCOUNTER — Inpatient Hospital Stay (HOSPITAL_COMMUNITY): Payer: Medicare HMO | Admitting: Physical Therapy

## 2014-06-24 DIAGNOSIS — Z5189 Encounter for other specified aftercare: Secondary | ICD-10-CM

## 2014-06-24 DIAGNOSIS — I4891 Unspecified atrial fibrillation: Secondary | ICD-10-CM

## 2014-06-24 DIAGNOSIS — I619 Nontraumatic intracerebral hemorrhage, unspecified: Secondary | ICD-10-CM

## 2014-06-24 DIAGNOSIS — I5032 Chronic diastolic (congestive) heart failure: Secondary | ICD-10-CM

## 2014-06-24 LAB — GLUCOSE, CAPILLARY
GLUCOSE-CAPILLARY: 154 mg/dL — AB (ref 70–99)
Glucose-Capillary: 94 mg/dL (ref 70–99)
Glucose-Capillary: 134 mg/dL — ABNORMAL HIGH (ref 70–99)
Glucose-Capillary: 163 mg/dL — ABNORMAL HIGH (ref 70–99)

## 2014-06-24 LAB — URINALYSIS, ROUTINE W REFLEX MICROSCOPIC
Bilirubin Urine: NEGATIVE
GLUCOSE, UA: NEGATIVE mg/dL
Ketones, ur: NEGATIVE mg/dL
Leukocytes, UA: NEGATIVE
Nitrite: NEGATIVE
PH: 5.5 (ref 5.0–8.0)
Protein, ur: 100 mg/dL — AB
SPECIFIC GRAVITY, URINE: 1.011 (ref 1.005–1.030)
Urobilinogen, UA: 0.2 mg/dL (ref 0.0–1.0)

## 2014-06-24 LAB — URINE MICROSCOPIC-ADD ON

## 2014-06-24 NOTE — Progress Notes (Signed)
Occupational Therapy Session Note  Patient Details  Name: [name] MRN: [id number] Date of Birth: 1930-10-01  Today's Date: 06/24/2014 OT Individual Time:  5284-1324 Total Treatment Time:  41 min   Short Term Goals: Week 1:  OT Short Term Goal 1 (Week 1): Pt will perform toilet transfer with max A OT Short Term Goal 1 - Progress (Week 1): Progressing toward goal OT Short Term Goal 2 (Week 1): Pt will engage in 10 minutes of functional activity before requiring rest break in order to increase endurance. OT Short Term Goal 2 - Progress (Week 1): Progressing toward goal OT Short Term Goal 3 (Week 1): Pt maintain dynamic sitting balance with supervision for 5 minutes during activity. OT Short Term Goal 3 - Progress (Week 1): Progressing toward goal OT Short Term Goal 4 (Week 1): Pt will perform LB dressing with Max A.  OT Short Term Goal 4 - Progress (Week 1): Progressing toward goal  Skilled Therapeutic Interventions/Progress Updates:   Pt lying supine in bed and unaware of incontinence with bowel upon arrival, participated in bed mobility with rolling from side-to-side at Mod-Max A in order to clean perineal and buttocks areas, as well to doff and donn brief.  Mod A for supine <> sitting at EOB, needing verbal cues for body positioning and assistance to position BLEs prior to sitting and for returning into supine position at end of ADL task.  Engaged in bathing task while seated at EOB with close supervision for sitting balance the majority of the time, Min A at times due to Lt lateral lean and decreased trunk balance when leaning forward to wash BLEs, Pt self-corrected posture and utilized bed rails on Rt side to bring posture back into midline position with no verbal cues.          Therapy Documentation Precautions:  Precautions Precautions: Fall Restrictions Weight Bearing Restrictions: No General:   Vital Signs: Therapy Vitals Temp: 97.7 F (36.5 C) Temp src: Oral Pulse  Rate: 61 Resp: 18 BP: 133/63 mmHg Patient Position (if appropriate): Sitting Oxygen Therapy SpO2: 99 % O2 Device: None (Room air) Pain: No c/o of pain.     Other Treatments:    See FIM for current functional status  Therapy/Group: Individual Therapy  Tekla Malachowski 06/24/2014, 3:34 PM

## 2014-06-24 NOTE — Progress Notes (Signed)
Occupational Therapy Session Note  Patient Details  Name: [name] MRN: [id number] Date of Birth: 01/13/1931  Today's Date: 06/24/2014 OT Individual Time: 8916-9450 OT Individual Time Calculation (min): 35 min    Short Term Goals: Week 2:  OT Short Term Goal 1 (Week 2): Pt will perform toilet transfer with max A OT Short Term Goal 2 (Week 2): Pt will engage in 10 minutes of functional activity before requiring rest break in order to increase endurance. OT Short Term Goal 3 (Week 2): Pt maintain dynamic sitting balance with supervision for 5 minutes during activity. OT Short Term Goal 4 (Week 2): Pt will perform LB dressing with Max A.  OT Short Term Goal 5 (Week 2): Pt will complete UB dressing with Min A.  Skilled Therapeutic Interventions/Progress Updates:    Engaged in [place] with focus on anterior weight shift, orientation to midline in unsupported sitting, and sit <> stand.  Pt seated in w/c upon arrival, willing to participate in treatment session.  Engaged in squat pivot transfer w/c > mat with over the back technique to promote anterior weight shift as needed to lift buttocks for transfer.  Engaged in multiple squat/scoots max-total assist of 1, requiring +2 for safety and last squat to mat.  Engaged in WB through [place], weight shifting, and reaching outside BOS to challenge midline orientation and utilize trunk muscles to regain midline, provided pt with mirror to provide visual feedback as necessary.  Pt with increased static sitting balance and ability to utilize trunk muscles to regain midline sitting (as pt tends to utilize bed rail when in bed).  Engaged in sit > stand with +2 with focus on anterior weight shift and use of mirror when in standing to promote upright standing balance.  Therapy Documentation Precautions:  Precautions Precautions: Fall Restrictions Weight Bearing Restrictions: No General:   Vital Signs: Therapy Vitals Temp: 97.7 F (36.5 C) Temp src:  Oral Pulse Rate: 61 Resp: 18 BP: 133/63 mmHg Patient Position (if appropriate): Sitting Oxygen Therapy SpO2: 99 % O2 Device: None (Room air) Pain:  Pt with no c/o pain  See FIM for current functional status  Therapy/Group: Individual Therapy  Simonne Come 06/24/2014, 3:21 PM

## 2014-06-24 NOTE — Progress Notes (Addendum)
Physical Therapy Weekly Progress Note  Patient Details  Name: [name] MRN: [id number] Date of Birth: 28-Jul-1931  Beginning of progress report period: June 15, 2014 End of progress report period: June 24, 2014  Today's Date: 06/24/2014 PT Individual Time: 5573-2202 PT Individual Time Calculation (min): 60 min  Patient has met 3 of 5 short term goals. Pt currently requires min-max A with bed mobility, basic transfers with max A, and gait with +2A for safety. Pt has not demonstrated significant progress with w/c mobility, as w/c mobility has not been primayr focus of sessions thus far. Discontinued short term goal for stand pivot transfers, as sessions have focused mainly on lateral scooting/squat pivot transfers.  Patient continues to demonstrate the following deficits: muscle weakness, impaired timing and sequencing,, ataxia, decreased coordination and decreased motor planning, decreased midline orientation and decreased attention to left, decreased problem solving, decreased sitting balance, decreased standing balance, decreased postural control, hemiplegia and decreased balance strategies and therefore will continue to benefit from skilled PT intervention to enhance overall performance with activity tolerance, balance, postural control, ability to compensate for deficits, functional use of  left upper extremity and left lower extremity and coordination.  Patient not progressing toward long term goals.  See goal revision..  Plan of care revisions: Downgraded long term goals addressing gait and w/c mobility.. PT Short Term Goals Week 1:  PT Short Term Goal 1 (Week 1): Pt will demonstrate rolling L in bed req min A PT Short Term Goal 1 - Progress (Week 1): Met PT Short Term Goal 2 (Week 1): Pt will demonstrate supine to sit req mod A PT Short Term Goal 2 - Progress (Week 1): Met PT Short Term Goal 3 (Week 1): Pt will demonstrate stand-pivot transfer bed to w/c req mod A.  PT  Short Term Goal 3 - Progress (Week 1): Discontinued (comment) (Focusing on squat pivot transfers) PT Short Term Goal 4 (Week 1): Pt will self propel manual w/c x 50' req min A PT Short Term Goal 4 - Progress (Week 1): Revised due to lack of progress PT Short Term Goal 5 (Week 1): Pt will tolerate ambulation with 2 person assist.  PT Short Term Goal 5 - Progress (Week 1): Met Week 2:  PT Short Term Goal 1 (Week 2): Pt will perform supine>sit with supervision with HOB flat using bed rail. PT Short Term Goal 2 (Week 2): Pt will perform sit>supine with mod A with HOB flat. PT Short Term Goal 3 (Week 2): Pt will transfer from bed<>w/c with min A and 25% cueing for technique. PT Short Term Goal 4 (Week 2): Pt will perform sit>stand with mod A and 50% cueing for technique. PT Short Term Goal 5 (Week 2): Pt will perform gait x25' with max A of single therapist.  Skilled Therapeutic Interventions/Progress Updates:    2:1. Pt received semi reclined in bed; agreeable to therapy. Session focused on gait training and functional transfers. Pt performed supine>sit with min A using bed rail with HOB flat, 25% cueing for sequencing.  Lateral scooting transfers performed from bed<>w/c<>mat table with slide board and mod A to L side, max A to R side, and manual facilitation of anterior weight shift. Discouraged use of RUE during all transfers to prevent pushing.  In treatment gym, pt performed gait x20' in controlled environment with EVA walker (to promote upright posture) with mod A overall and +2A for manual advancement of EVA walker. Therapist positioned at pt's R side providing manual repositioning  and LLE (to correct excessive LLE adduction). Multimodal cueing focused on increased lateral weight shift to R side, increased bilat step length, increased speed, upright posture, and forward gaze. With increased fatigue, pt demonstrated increasingly more prominent lateral trunk pushing to L side and less cervical/thoracic  spine extension. Gait trial ended secondary to pt expressing urgent need to have bowel movement. Transported pt back to room in w/c with total A. Transferred pt from w/c<>toilet with total A using Sara Plus mechanical lift. Pt continent of bowel and bladder; RN notified. Utilized Sara Plus for standing trials during peri care and brief change. Per pt request to return to bed, pt performed sit>supine with max A due to fatigue. Once in bed, pt performed Rolling to L side with supervision using L rail with cueing for technique; rolling to R side with mod A, with cueing for technique. Pt left semi reclined in bed with 3 rails up, bed alarm on, and all needs within reach.  Therapy Documentation Precautions:  Precautions Precautions: Fall Restrictions Weight Bearing Restrictions: No Pain: Pain Assessment Pain Assessment: No/denies pain Locomotion : Ambulation Ambulation/Gait Assistance: 1: +2 Total assist   See FIM for current functional status  Therapy/Group: Individual Therapy  Bryker Fletchall, Malva Cogan 06/24/2014, 11:13 AM

## 2014-06-24 NOTE — Progress Notes (Signed)
Subjective/Complaints: 80 y.o. right-handed male with history diabetes mellitus with peripheral neuropathy, CAD status post CABG, atrial flutter on chronic Coumadin, chronic renal insufficiency with baseline creatinine 7.42, chronic diastolic congestive heart failure. Independent with occasional cane and driving living with family. Admitted 06/11/2049 of left-sided weakness and slurred speech as well as recent fall but denies striking his head. Cranial CT scan shows a right pontomedullary 14 x 22 mm oval focus consistent with acute hemorrhage. INR on admission of 5.95 and systolic blood pressure 638. Placed on intravenous Lopressor and Coumadin was reversed. Followup cranial CT scan unchanged. 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 Tolerating a regular consistency diet. Coumadin remains on hold with cardiac rate control.  Slept ok RN notes foul odor to urine, inc but does well during the day with timed toileting  Review of Systems - Negative except weak on left  Objective: Vital Signs: Blood pressure 147/61, pulse 53, temperature 98.7 F (37.1 C), temperature source Oral, resp. rate 18, weight 81.149 kg (178 lb 14.4 oz), SpO2 96.00%. No results found. Results for orders placed during the hospital encounter of 06/14/14 (from the past 72 hour(s))  GLUCOSE, CAPILLARY     Status: Abnormal   Collection Time    06/21/14 11:34 AM      Result Value Ref Range   Glucose-Capillary 192 (*) 70 - 99 mg/dL  GLUCOSE, CAPILLARY     Status: Abnormal   Collection Time    06/21/14  4:39 PM      Result Value Ref Range   Glucose-Capillary 197 (*) 70 - 99 mg/dL  GLUCOSE, CAPILLARY     Status: Abnormal   Collection Time    06/21/14  9:03 PM      Result Value Ref Range   Glucose-Capillary 186 (*) 70 - 99 mg/dL  GLUCOSE, CAPILLARY     Status: Abnormal   Collection Time    06/22/14  7:24 AM      Result Value Ref Range   Glucose-Capillary 115 (*) 70 - 99 mg/dL  GLUCOSE,  CAPILLARY     Status: Abnormal   Collection Time    06/22/14 12:06 PM      Result Value Ref Range   Glucose-Capillary 188 (*) 70 - 99 mg/dL  GLUCOSE, CAPILLARY     Status: Abnormal   Collection Time    06/22/14  4:17 PM      Result Value Ref Range   Glucose-Capillary 197 (*) 70 - 99 mg/dL  GLUCOSE, CAPILLARY     Status: Abnormal   Collection Time    06/22/14  8:41 PM      Result Value Ref Range   Glucose-Capillary 143 (*) 70 - 99 mg/dL  GLUCOSE, CAPILLARY     Status: Abnormal   Collection Time    06/23/14  8:56 AM      Result Value Ref Range   Glucose-Capillary 110 (*) 70 - 99 mg/dL  GLUCOSE, CAPILLARY     Status: Abnormal   Collection Time    06/23/14 12:03 PM      Result Value Ref Range   Glucose-Capillary 135 (*) 70 - 99 mg/dL  GLUCOSE, CAPILLARY     Status: Abnormal   Collection Time    06/23/14  4:36 PM      Result Value Ref Range   Glucose-Capillary 138 (*) 70 - 99 mg/dL  GLUCOSE, CAPILLARY     Status: Abnormal   Collection Time    06/23/14  8:59 PM  Result Value Ref Range   Glucose-Capillary 176 (*) 70 - 99 mg/dL  GLUCOSE, CAPILLARY     Status: None   Collection Time    06/24/14  6:50 AM      Result Value Ref Range   Glucose-Capillary 94  70 - 99 mg/dL     HEENT: normal Cardio: RRR and no murmur Resp: CTA B/L and unlabored GI: BS positive and NT, ND Extremity:  No Edema Skin:   Other Left transerve healed breast incision at nipple level, left upper back incion well healed, dry skin both feet without open areas Neuro: Lethargic, Flat, Cranial Nerve Abnormalities Left central 7, Abnormal Sensory decreased in both feet, Abnormal Motor bilateral hand intrinsic atrophy and Other motor tr Left bi, tri grip, 3- Left HF, KE , 2- ankle DF Musc/Skel:  Other no pain with AROM in BUE and BLE, or with neck movement, sitting balance fair minus Gen NAD   Assessment/Plan: 1. Functional deficits secondary to RIght pontomedullary hemorrhage with Left hemiparesis  superimposed on severe diabetic neuropathy which require 3+ hours per day of interdisciplinary therapy in a comprehensive inpatient rehab setting. Physiatrist is providing close team supervision and 24 hour management of active medical problems listed below. Physiatrist and rehab team continue to assess barriers to discharge/monitor patient progress toward functional and medical goals.  FIM: FIM - Bathing Bathing Steps Patient Completed: Chest;Abdomen;Right Arm Bathing: 2: Max-Patient completes 3-4 56f 10 parts or 25-49%  FIM - Upper Body Dressing/Undressing Upper body dressing/undressing steps patient completed: Thread/unthread right sleeve of pullover shirt/dresss Upper body dressing/undressing: 2: Max-Patient completed 25-49% of tasks FIM - Lower Body Dressing/Undressing Lower body dressing/undressing steps patient completed: Thread/unthread left pants leg Lower body dressing/undressing: 1: Total-Patient completed less than 25% of tasks  FIM - Toileting Toileting: 0: No continent bowel/bladder events this shift  FIM - Air cabin crew Transfers: 1-Mechanical lift Charlaine Dalton)  FIM - Control and instrumentation engineer Devices: Arm rests;Sliding board Bed/Chair Transfer: 2: Chair or W/C > Bed: Max A (lift and lower assist);2: Bed > Chair or W/C: Max A (lift and lower assist);4: Sit > Supine: Min A (steadying pt. > 75%/lift 1 leg);2: Supine > Sit: Max A (lifting assist/Pt. 25-49%)  FIM - Locomotion: Wheelchair Distance: 40 Locomotion: Wheelchair: 1: Total Assistance/staff pushes wheelchair (Pt<25%) FIM - Locomotion: Ambulation Locomotion: Ambulation Assistive Devices: Other (comment) (3 musketeers) Ambulation/Gait Assistance: 1: +2 Total assist Locomotion: Ambulation: 0: Activity did not occur  Comprehension Comprehension Mode: Auditory Comprehension: 5-Understands basic 90% of the time/requires cueing < 10% of the time  Expression Expression Mode:  Verbal Expression: 4-Expresses basic 75 - 89% of the time/requires cueing 10 - 24% of the time. Needs helper to occlude trach/needs to repeat words.  Social Interaction Social Interaction: 3-Interacts appropriately 50 - 74% of the time - May be physically or verbally inappropriate.  Problem Solving Problem Solving: 2-Solves basic 25 - 49% of the time - needs direction more than half the time to initiate, plan or complete simple activities  Memory Memory: 2-Recognizes or recalls 25 - 49% of the time/requires cueing 51 - 75% of the time  Medical Problem List and Plan:  1. 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 Latest cranial CT scan stable  2. DVT Prophylaxis/Anticoagulation: Subcutaneous heparin initiated for DVT prophylaxis 06/13/2014  3. Pain Management: Tylenol as needed. Monitor the increased mobility  4. Diabetes mellitus with peripheral neuropathy. Latest hemoglobin A1c 6.7. Check blood sugars a.c. and at bedtime.Tradjenta 5  mg daily. Patient on Levemir 15 units each bedtime and Onglyza 2.5 mg daily prior to admission. Plan to resume as needed  5. Neuropsych: This patient is not capable of making decisions on his own behalf.  6. Skin/Wound Care: Routine skin checks  7. Chronic renal insufficiency. Baseline creatinine 2.84. Followup chemistries show stable GFR ~20 8. Chronic diastolic congestive heart failure. Monitor for any signs of fluid overload. Patient on Lasix 80 mg Daily.  9. Hypertension. Norvasc 10 mg daily, Coreg 6.25 mg twice a day. Monitor with increased mobility  10. CAD with history of CABG. No chest pain or shortness of breath  11. Hyperlipidemia. Lipitor  12. GERD. Protonix 13.  Possible UTI, check UA C and S  LOS (Days) 10 A FACE TO FACE EVALUATION WAS PERFORMED  Graceanna Theissen E 06/24/2014, 7:58 AM

## 2014-06-24 NOTE — Progress Notes (Signed)
Occupational Therapy Weekly Progress Note  Patient Details  Name: [name] MRN: [id number] Date of Birth: April 13, 1931  Beginning of progress report period: June 15, 2014 End of progress report period: June 24, 2014   Patient has met 0 of 4 short term goals.  Pt is slow to progress towards goals due to decreased initiation, attention to task, and decreased activity tolerance.  Pt currently requires mod-max assist for bed mobility and mod assist to maintain dynamic sitting balance.  +2 for sit <> stand for LB dressing and with squat pivot transfer.    Patient continues to demonstrate the following deficits: Lt hemiplegia, decreased cardiorespiratory endurance, apraxia, ataxia, decreased coordination, impaired awareness, problem solving, and decreased safety awareness and therefore will continue to benefit from skilled OT intervention to enhance overall performance with BADL and Reduce care partner burden.  Patient progressing toward long term goals..  Continue plan of care.  OT Short Term Goals Week 1:  OT Short Term Goal 1 (Week 1): Pt will perform toilet transfer with max A OT Short Term Goal 1 - Progress (Week 1): Progressing toward goal OT Short Term Goal 2 (Week 1): Pt will engage in 10 minutes of functional activity before requiring rest break in order to increase endurance. OT Short Term Goal 2 - Progress (Week 1): Progressing toward goal OT Short Term Goal 3 (Week 1): Pt maintain dynamic sitting balance with supervision for 5 minutes during activity. OT Short Term Goal 3 - Progress (Week 1): Progressing toward goal OT Short Term Goal 4 (Week 1): Pt will perform LB dressing with Max A.  OT Short Term Goal 4 - Progress (Week 1): Progressing toward goal Week 2:  OT Short Term Goal 1 (Week 2): Pt will perform toilet transfer with max A OT Short Term Goal 2 (Week 2): Pt will engage in 10 minutes of functional activity before requiring rest break in order to increase  endurance. OT Short Term Goal 3 (Week 2): Pt maintain dynamic sitting balance with supervision for 5 minutes during activity. OT Short Term Goal 4 (Week 2): Pt will perform LB dressing with Max A.  OT Short Term Goal 5 (Week 2): Pt will complete UB dressing with Min A.  Skilled Therapeutic Interventions/Progress Updates:      Therapy Documentation Precautions:  Precautions Precautions: Fall Restrictions Weight Bearing Restrictions: No General:   Vital Signs: Therapy Vitals Temp: 98.7 F (37.1 C) Temp src: Oral Pulse Rate: 53 Resp: 18 BP: 147/61 mmHg Patient Position (if appropriate): Lying Oxygen Therapy SpO2: 96 % O2 Device: None (Room air)  See FIM for current functional status    Zeeshan Korte, [facility] 06/24/2014, 8:10 AM

## 2014-06-24 NOTE — Progress Notes (Signed)
Speech Language Pathology Weekly Progress and Session Note  Patient Details  Name: [name] MRN: [id number] Date of Birth: 1931-06-16  Beginning of progress report period: June 17, 2014 End of progress report period: June 24, 2014  Today's Date: 06/24/2014 SLP Individual Time: 0810-0855 SLP Individual Time Calculation (min): 45 min  Short Term Goals: Week 1: SLP Short Term Goal 1 (Week 1): Patient will utilize speech strategies (slow down, over-articulate, pause between words) to achieve 75% intelligibility at phrase level, with minimal cues. SLP Short Term Goal 1 - Progress (Week 1): Met SLP Short Term Goal 2 (Week 1): Patient will demonstrate anticipatory awareness by describing impact of speech and physical deficits, with moderate cues. SLP Short Term Goal 2 - Progress (Week 1): Revised due to lack of progress SLP Short Term Goal 3 (Week 1): Patient will recall and demonstrate understanding of safety precautions and strategies learned in PT, OT, SLP treatment sessions, with minimal cues. SLP Short Term Goal 3 - Progress (Week 1): Progressing toward goal SLP Short Term Goal 4 (Week 1): Patient will solve basic level functional problems wtih 80% accuracy. SLP Short Term Goal 4 - Progress (Week 1): Progressing toward goal SLP Short Term Goal 5 (Week 1): Pt will utilize his recommended swallowing precautions to minimize overt s/s of aspiration with presentations of his currently prescribed diet.  SLP Short Term Goal 5 - Progress (Week 1): Met    New Short Term Goals: Week 2: SLP Short Term Goal 1 (Week 2): Patient will utilize speech strategies (slow down, over-articulate, pause between words) to achieve >80% intelligibility at phrase level, with minimal cues. SLP Short Term Goal 2 (Week 2): Patient will demonstrate intellectual awareness by identifying at least 1 speech and 1 physical deficit, with moderate cues. SLP Short Term Goal 3 (Week 2): Patient will recall and  demonstrate understanding of safety precautions and strategies learned in PT, OT, SLP treatment sessions, with minimal cues. SLP Short Term Goal 4 (Week 2): Patient will solve basic level functional problems wtih 80% accuracy with min assist. SLP Short Term Goal 5 (Week 2): Pt will utilize his recommended swallowing precautions with mod I to minimize overt s/s of aspiration with presentations of his currently prescribed diet.   Weekly Progress Updates:  Pt made slow functional gains this reporting period and has met 2 out of 5 short term goals due to improved use of compensatory strategies for swallowing precautions and use of dysarthria strategies to improve speech intelligibility at the phrase level.  Currently, pt requires mod assist to complete cognitive-linguistic tasks due to delayed processing speed and decreased sustained attention.  Pt also utilizes his recommended swallowing precautions with supervision verbal cues to minimize overt s/s of aspiration with regular solids and thin liquids.  Pt would continue to benefit from continued ST while inpatient in order to maximize functional independence and reduce burden of care upon discharge.  Pt and family education is ongoing.     Intensity: Minumum of 1-2 x/day, 30 to 90 minutes Frequency: 5 out of 7 days Duration/Length of Stay: 10-14 days Treatment/Interventions: Cognitive remediation/compensation;Speech/Language facilitation;Cueing hierarchy;Functional tasks;Internal/external aids;Patient/family education;Therapeutic Exercise   Daily Session  Skilled Therapeutic Interventions: Pt was seen for skilled speech therapy targeting dysphagia goals.  SLP completed skilled observations during presentations of pt's currently prescribed diet with pt exhibiting prolonged mastication of dry, crunchy solids followed by immediate cough and piecemeal swallows x2.  Additionally, pt presented with immediate cough x1 following large consecutive cup sips of  thin liquids.  No overt s/s of aspiration with moist solids or with supervision verbal cues to take small sips of thin liquids.  Goals updated on this date to reflect current progress and plan of care.         FIM:  Comprehension Comprehension Mode: Auditory Comprehension: 5-Understands basic 90% of the time/requires cueing < 10% of the time Expression Expression Mode: Verbal Expression: 4-Expresses basic 75 - 89% of the time/requires cueing 10 - 24% of the time. Needs helper to occlude trach/needs to repeat words. Social Interaction Social Interaction: 4-Interacts appropriately 75 - 89% of the time - Needs redirection for appropriate language or to initiate interaction. Problem Solving Problem Solving: 2-Solves basic 25 - 49% of the time - needs direction more than half the time to initiate, plan or complete simple activities Memory Memory: 2-Recognizes or recalls 25 - 49% of the time/requires cueing 51 - 75% of the time FIM - Eating Eating Activity: 5: Supervision/cues     Pain Pain Assessment Pain Assessment: No/denies pain  Therapy/Group: Individual Therapy  [name], M.A. CCC-SLP  [name], [name] 06/24/2014, 4:06 PM

## 2014-06-25 ENCOUNTER — Inpatient Hospital Stay (HOSPITAL_COMMUNITY): Payer: Medicare HMO | Admitting: Occupational Therapy

## 2014-06-25 ENCOUNTER — Ambulatory Visit (HOSPITAL_COMMUNITY): Payer: Medicare Other | Admitting: Speech Pathology

## 2014-06-25 LAB — URINE CULTURE

## 2014-06-25 LAB — GLUCOSE, CAPILLARY
GLUCOSE-CAPILLARY: 106 mg/dL — AB (ref 70–99)
Glucose-Capillary: 146 mg/dL — ABNORMAL HIGH (ref 70–99)
Glucose-Capillary: 148 mg/dL — ABNORMAL HIGH (ref 70–99)

## 2014-06-25 NOTE — Progress Notes (Signed)
Occupational Therapy Session Note  Patient Details  Name: [name] MRN: [id number] Date of Birth: 04-Apr-1931  Today's Date: 06/25/2014 OT Co-Treatment Time: 1330-1405 (co-tx with PT full time 1300-1405) OT Co-Treatment Time Calculation (min): 35 min   Short Term Goals: Week 2:  OT Short Term Goal 1 (Week 2): Pt will perform toilet transfer with max A OT Short Term Goal 2 (Week 2): Pt will engage in 10 minutes of functional activity before requiring rest break in order to increase endurance. OT Short Term Goal 3 (Week 2): Pt maintain dynamic sitting balance with supervision for 5 minutes during activity. OT Short Term Goal 4 (Week 2): Pt will perform LB dressing with Max A.  OT Short Term Goal 5 (Week 2): Pt will complete UB dressing with Min A.  Skilled Therapeutic Interventions/Progress Updates:    Engaged in skilled co-tx with PT with focus on anterior weight shift as needed for sit > stand and squat pivot transfers as well as LB dressing.  Pt received in bed, completed squat pivot transfer to Rt with +2 assist due to tendency to push when transferring to Rt.  Engaged in standing task from therapy mat with focus on increased anterior weight shift with pt able to complete sit to partial stand with mod-max assist, however required +2 to maintain standing balance due to pushing through RLE towards Lt.  Progressed from standing 3 musketeer style to upright with Harmon Pier walker to focus on upright standing with UE support and use of mirror for visual feedback.  While in standing, this therapist provided tactile cues progressing to manual facilitation to promote weight shift to Rt when reaching for object to Rt. See PT note for further information regarding utilizing step to increase upright standing and decrease pushing.  Therapy Documentation Precautions:  Precautions Precautions: Fall Restrictions Weight Bearing Restrictions: No General:   Vital Signs: Therapy Vitals Temp: 97.7 F  (36.5 C) Temp src: Oral Pulse Rate: 60 Resp: 17 BP: 138/60 mmHg Patient Position (if appropriate): Lying Oxygen Therapy SpO2: 99 % O2 Device: None (Room air) Pain: Pain Assessment Pain Assessment: No/denies pain  See FIM for current functional status  Therapy/Group: Co-Treatment  Evlyn Amason 06/25/2014, 3:21 PM

## 2014-06-25 NOTE — Progress Notes (Signed)
Occupational Therapy Session Note  Patient Details  Name: [name] MRN: [id number] Date of Birth: 03-May-1931  Today's Date: 06/25/2014 OT Individual Time: 1657-9038 OT Individual Time Calculation (min): 62 min    Short Term Goals: Week 2:  OT Short Term Goal 1 (Week 2): Pt will perform toilet transfer with max A OT Short Term Goal 2 (Week 2): Pt will engage in 10 minutes of functional activity before requiring rest break in order to increase endurance. OT Short Term Goal 3 (Week 2): Pt maintain dynamic sitting balance with supervision for 5 minutes during activity. OT Short Term Goal 4 (Week 2): Pt will perform LB dressing with Max A.  OT Short Term Goal 5 (Week 2): Pt will complete UB dressing with Min A.  Skilled Therapeutic Interventions/Progress Updates:  Engaged in ADL self-care tasks seated at EOB while focusing on sitting balance and trunk control during dynamic sitting activities.  Engaged in bed mobility with rolling Rt and Lt to wash buttocks, noted Pt rolled to Lt side with supervision utilizing bed rails.  Required Min A with supine > sit and Mod A with sit > supine due to required lifting of BLEs due to requiring assistance with body positioning during bed mobility.  Encouraged Pt to incorporate weak Lt hand into bathing task by utilizing hand-over-hand technique when washing and applying deodorant to Rt side.  Removed bed rails to challenge trunk control and sitting balance while both feet are on floor with seated dynamic task when washing BLEs, self-correcting posture into midline with no verbal or tactile cues needed.  Participated in reaching across midline to retrieve items while seated at EOB, incorporating WB through Lt elbow during task prior to bringing posture into midline to place item on table.            Therapy Documentation Precautions:  Precautions Precautions: Fall Restrictions Weight Bearing Restrictions: No General:   Vital Signs:   Pain: Pain  Assessment Pain Assessment: No/denies pain   Other Treatments:    See FIM for current functional status  Therapy/Group: Individual Therapy  Kian Ottaviano 06/25/2014, 12:28 PM

## 2014-06-25 NOTE — Progress Notes (Signed)
Subjective/Complaints: 80 y.o. right-handed male with history diabetes mellitus with peripheral neuropathy, CAD status post CABG, atrial flutter on chronic Coumadin, chronic renal insufficiency with baseline creatinine 6.06, chronic diastolic congestive heart failure. Independent with occasional cane and driving living with family. Admitted 06/11/2049 of left-sided weakness and slurred speech as well as recent fall but denies striking his head. Cranial CT scan shows a right pontomedullary 14 x 22 mm oval focus consistent with acute hemorrhage. INR on admission of 0.04 and systolic blood pressure 599. Placed on intravenous Lopressor and Coumadin was reversed. Followup cranial CT scan unchanged. 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 Tolerating a regular consistency diet. Coumadin remains on hold with cardiac rate control.  Pt is not complaining of anything this am Denies CP, SOB, bowel or bladder issues but is unaware of incontinence  Review of Systems - Negative except weak on left  Objective: Vital Signs: Blood pressure 140/68, pulse 65, temperature 98.1 F (36.7 C), temperature source Oral, resp. rate 18, weight 81.149 kg (178 lb 14.4 oz), SpO2 99.00%. No results found. Results for orders placed during the hospital encounter of 06/14/14 (from the past 72 hour(s))  GLUCOSE, CAPILLARY     Status: Abnormal   Collection Time    06/22/14 12:06 PM      Result Value Ref Range   Glucose-Capillary 188 (*) 70 - 99 mg/dL  GLUCOSE, CAPILLARY     Status: Abnormal   Collection Time    06/22/14  4:17 PM      Result Value Ref Range   Glucose-Capillary 197 (*) 70 - 99 mg/dL  GLUCOSE, CAPILLARY     Status: Abnormal   Collection Time    06/22/14  8:41 PM      Result Value Ref Range   Glucose-Capillary 143 (*) 70 - 99 mg/dL  GLUCOSE, CAPILLARY     Status: Abnormal   Collection Time    06/23/14  8:56 AM      Result Value Ref Range   Glucose-Capillary 110 (*) 70 - 99  mg/dL  GLUCOSE, CAPILLARY     Status: Abnormal   Collection Time    06/23/14 12:03 PM      Result Value Ref Range   Glucose-Capillary 135 (*) 70 - 99 mg/dL  GLUCOSE, CAPILLARY     Status: Abnormal   Collection Time    06/23/14  4:36 PM      Result Value Ref Range   Glucose-Capillary 138 (*) 70 - 99 mg/dL  GLUCOSE, CAPILLARY     Status: Abnormal   Collection Time    06/23/14  8:59 PM      Result Value Ref Range   Glucose-Capillary 176 (*) 70 - 99 mg/dL  GLUCOSE, CAPILLARY     Status: None   Collection Time    06/24/14  6:50 AM      Result Value Ref Range   Glucose-Capillary 94  70 - 99 mg/dL  GLUCOSE, CAPILLARY     Status: Abnormal   Collection Time    06/24/14 11:11 AM      Result Value Ref Range   Glucose-Capillary 134 (*) 70 - 99 mg/dL   Comment 1 Notify RN    URINALYSIS, ROUTINE W REFLEX MICROSCOPIC     Status: Abnormal   Collection Time    06/24/14  1:22 PM      Result Value Ref Range   Color, Urine YELLOW  YELLOW   APPearance CLEAR  CLEAR   Specific Gravity, Urine 1.011  1.005 - 1.030   pH 5.5  5.0 - 8.0   Glucose, UA NEGATIVE  NEGATIVE mg/dL   Hgb urine dipstick SMALL (*) NEGATIVE   Bilirubin Urine NEGATIVE  NEGATIVE   Ketones, ur NEGATIVE  NEGATIVE mg/dL   Protein, ur 100 (*) NEGATIVE mg/dL   Urobilinogen, UA 0.2  0.0 - 1.0 mg/dL   Nitrite NEGATIVE  NEGATIVE   Leukocytes, UA NEGATIVE  NEGATIVE  URINE MICROSCOPIC-ADD ON     Status: None   Collection Time    06/24/14  1:22 PM      Result Value Ref Range   Squamous Epithelial / LPF RARE  RARE   RBC / HPF 0-2  <3 RBC/hpf  GLUCOSE, CAPILLARY     Status: Abnormal   Collection Time    06/24/14  4:38 PM      Result Value Ref Range   Glucose-Capillary 163 (*) 70 - 99 mg/dL  GLUCOSE, CAPILLARY     Status: Abnormal   Collection Time    06/24/14 10:02 PM      Result Value Ref Range   Glucose-Capillary 154 (*) 70 - 99 mg/dL  GLUCOSE, CAPILLARY     Status: Abnormal   Collection Time    06/25/14  6:51 AM       Result Value Ref Range   Glucose-Capillary 106 (*) 70 - 99 mg/dL     HEENT: normal Cardio: RRR and no murmur Resp: CTA B/L and unlabored GI: BS positive and NT, ND Extremity:  No Edema Skin:   Other Left transerve healed breast incision at nipple level, left upper back incion well healed, dry skin both feet without open areas Neuro: Lethargic, Flat, Cranial Nerve Abnormalities Left central 7, Abnormal Sensory decreased in both feet, Abnormal Motor bilateral hand intrinsic atrophy and Other motor tr Left bi, tri grip, 3- Left HF, KE , 2- ankle DF Musc/Skel:  Other no pain with AROM in BUE and BLE, or with neck movement, sitting balance fair minus Gen NAD   Assessment/Plan: 1. Functional deficits secondary to RIght pontomedullary hemorrhage with Left hemiparesis superimposed on severe diabetic neuropathy which require 3+ hours per day of interdisciplinary therapy in a comprehensive inpatient rehab setting. Physiatrist is providing close team supervision and 24 hour management of active medical problems listed below. Physiatrist and rehab team continue to assess barriers to discharge/monitor patient progress toward functional and medical goals.  FIM: FIM - Bathing Bathing Steps Patient Completed: Chest;Left Arm;Abdomen;Right upper leg;Left upper leg Bathing: 3: Mod-Patient completes 5-7 48f 10 parts or 50-74%  FIM - Upper Body Dressing/Undressing Upper body dressing/undressing steps patient completed: Thread/unthread right sleeve of pullover shirt/dresss Upper body dressing/undressing: 0: Wears gown/pajamas-no public clothing FIM - Lower Body Dressing/Undressing Lower body dressing/undressing steps patient completed: Thread/unthread left pants leg Lower body dressing/undressing: 0: Wears gown/pajamas-no public clothing  FIM - Toileting Toileting: 1: Total-Patient completed zero steps, helper did all 3  FIM - Air cabin crew Transfers: 1-Mechanical lift Clarise Cruz)  FIM -  Control and instrumentation engineer Devices: Arm rests;Sliding board;Bed rails Bed/Chair Transfer: 4: Supine > Sit: Min A (steadying Pt. > 75%/lift 1 leg);2: Sit > Supine: Max A (lifting assist/Pt. 25-49%);2: Bed > Chair or W/C: Max A (lift and lower assist);2: Chair or W/C > Bed: Max A (lift and lower assist)  FIM - Locomotion: Wheelchair Distance: 40 Locomotion: Wheelchair: 1: Total Assistance/staff pushes wheelchair (Pt<25%) FIM - Locomotion: Ambulation Locomotion: Ambulation Assistive Devices: Ethelene Hal Ambulation/Gait Assistance: 1: +2 Total assist;3: Mod  assist Locomotion: Ambulation: 1: Two helpers  Comprehension Comprehension Mode: Auditory Comprehension: 5-Understands basic 90% of the time/requires cueing < 10% of the time  Expression Expression Mode: Verbal Expression: 4-Expresses basic 75 - 89% of the time/requires cueing 10 - 24% of the time. Needs helper to occlude trach/needs to repeat words.  Social Interaction Social Interaction: 3-Interacts appropriately 50 - 74% of the time - May be physically or verbally inappropriate.  Problem Solving Problem Solving: 2-Solves basic 25 - 49% of the time - needs direction more than half the time to initiate, plan or complete simple activities  Memory Memory: 2-Recognizes or recalls 25 - 49% of the time/requires cueing 51 - 75% of the time  Medical Problem List and Plan:  1. 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 Latest cranial CT scan stable  2. DVT Prophylaxis/Anticoagulation: Subcutaneous heparin initiated for DVT prophylaxis 06/13/2014  3. Pain Management: Tylenol as needed. Monitor the increased mobility  4. Diabetes mellitus with peripheral neuropathy. Latest hemoglobin A1c 6.7. Check blood sugars a.c. and at bedtime.Tradjenta 5 mg daily.Just on Trajenta now Patient on Levemir 15 units each bedtime and Onglyza 2.5 mg daily prior to admission. Plan to  resume as needed  5. Neuropsych: This patient is not capable of making decisions on his own behalf.  6. Skin/Wound Care: Routine skin checks  7. Chronic renal insufficiency. Baseline creatinine 2.84. Followup chemistries show stable GFR ~20 8. Chronic diastolic congestive heart failure. Monitor for any signs of fluid overload. Patient on Lasix 80 mg Daily.  9. Hypertension. Norvasc 10 mg daily, Coreg 6.25 mg twice a day. Monitor with increased mobility  10. CAD with history of CABG. No chest pain or shortness of breath  11. Hyperlipidemia. Lipitor  12. GERD. Protonix 13.  Possible UTI, check UA C and S  LOS (Days) 11 A FACE TO FACE EVALUATION WAS PERFORMED  [name],[name] 06/25/2014, 7:27 AM

## 2014-06-25 NOTE — Progress Notes (Signed)
Speech Language Pathology Daily Session Note  Patient Details  Name: [name] MRN: [id number] Date of Birth: 02-Apr-1931  Today's Date: 06/25/2014 SLP Individual Time: 0800-0900 SLP Individual Time Calculation (min): 60 min  Short Term Goals: Week 2: SLP Short Term Goal 1 (Week 2): Patient will utilize speech strategies (slow down, over-articulate, pause between words) to achieve >80% intelligibility at phrase level, with minimal cues. SLP Short Term Goal 2 (Week 2): Patient will demonstrate intellectual awareness by identifying at least 1 speech and 1 physical deficit, with moderate cues. SLP Short Term Goal 3 (Week 2): Patient will recall and demonstrate understanding of safety precautions and strategies learned in PT, OT, SLP treatment sessions, with minimal cues. SLP Short Term Goal 4 (Week 2): Patient will solve basic level functional problems wtih 80% accuracy with min assist. SLP Short Term Goal 5 (Week 2): Pt will utilize his recommended swallowing precautions with mod I to minimize overt s/s of aspiration with presentations of his currently prescribed diet.   Skilled Therapeutic Interventions:  Pt was seen for skilled speech therapy targeting cognitive-linguistic goals and goals for dysphagia. Pt recalled recommended swallowing precautions with min assist verbal cues prior to consuming his currently prescribed diet with min assist question cues. Pt required overall min assist-supervision level verbal cues for use of swallowing precautions during a functional meal with pt exhibiting soft throat clear with larger boluses of thin liquids.  SLP also facilitated the session with repetitions of oral motor exercises; pt able to return demonstration with supervision instructional cues.  Additionally, pt recalled at least 1 dysarthria strategy with min cuing, improving to recall of 2 with use of written aid.  SLP provided education related to techniques to improve breath support and vocal  intensity for speech, with pt able to return demonstration of sustained phonation for ~7-10 seconds.  Continue per current plan of care.   FIM:  Comprehension Comprehension Mode: Auditory Comprehension: 5-Understands basic 90% of the time/requires cueing < 10% of the time Expression Expression Mode: Verbal Expression: 4-Expresses basic 75 - 89% of the time/requires cueing 10 - 24% of the time. Needs helper to occlude trach/needs to repeat words. Problem Solving Problem Solving: 3-Solves basic 50 - 74% of the time/requires cueing 25 - 49% of the time Memory Memory: 3-Recognizes or recalls 50 - 74% of the time/requires cueing 25 - 49% of the time FIM - Eating Eating Activity: 5: Supervision/cues  Pain Pain Assessment Pain Assessment: No/denies pain  Therapy/Group: Individual Therapy  [name], M.A. CCC-SLP  Terence Googe, Selinda Orion 06/25/2014, 12:19 PM

## 2014-06-25 NOTE — Progress Notes (Signed)
I have reviewed and agree with the attached treatment note.  Sayge Brienza, OTR/L 

## 2014-06-25 NOTE — Progress Notes (Signed)
I have reviewed and agree with the attached treatment note.  Espyn Radwan, OTR/L 

## 2014-06-25 NOTE — Progress Notes (Signed)
Physical Therapy Session Note  Patient Details  Name: [name] MRN: [id number] Date of Birth: 11/27/1930  Today's Date: 06/25/2014 PT Co-Treatment Time: 1300-1330 (Co-tx with OT; entire session from 1300-1405) PT Co-Treatment Time Calculation (min): 30 min  Short Term Goals: Week 2:  PT Short Term Goal 1 (Week 2): Pt will perform supine>sit with supervision with HOB flat using bed rail. PT Short Term Goal 2 (Week 2): Pt will perform sit>supine with mod A with HOB flat. PT Short Term Goal 3 (Week 2): Pt will transfer from bed<>w/c with min A and 25% cueing for technique. PT Short Term Goal 4 (Week 2): Pt will perform sit>stand with mod A and 50% cueing for technique. PT Short Term Goal 5 (Week 2): Pt will perform gait x25' with max A of single therapist.  Skilled Therapeutic Interventions/Progress Updates:    Skilled co-tx with primary OT focusing on functional transfers, postural control and midline orientation in standing. Performed supine>sit (to L side for increased LUE weightbearing) with HOB flat using rail requiring min A, tactile cueing at L ribcage and R iliac crest. Pt performed multiple lateal scooting transfers from bed<>w/c<>mat table with max A to +2A for safety; manual facilitation of anterior weight shift. Discouraged use of RUE during all transfers to prevent pushing.Transported pt to gym in w/c with total A for energy conservation. Pt performed multiple sit<>stand transfers from elevated mat table with +2A, tactile cueing at bilat knees for increased weightbearing, Dycem under R foot to maintain position. See below for detailed description of NMR. Returned to room, where session ended. Pt performed sitt>supine with HOB flat, mod A for bilat LE management. Departed with pt semi reclined in bed with 3 rails up, bed alarm on, and all needs within reach.  Therapy Documentation Precautions:  Precautions Precautions: Fall Restrictions Weight Bearing Restrictions: No Pain:  Pt denies pain during session. NMR: Neuromuscular Facilitation: Right;Left;Upper Extremity;Lower Extremity;Activity to increase lateral weight shifting;Activity to increase sustained activation;Activity to increase anterior-posterior weight shifting;Activity to increase motor control;Activity to increase grading  Mirror positioned anterior to pt for visual feedback, postual awareness. Pt performed dynamic standing trials x3 with bilat UE support at EVA walker to promote upright posture and LUE weightbearing. Pt required mod A for stability, multimodal cueing for upright posture, bilat hip/knee extension. Pt performed anterolateral reaching with RUE for functional weight shifting, midline orientation, and postural control. During final 2 trials, placed 2" step under L foot in attempt to mitigate pushing to L side, promote midline posture; however, pt demonstrated increased postural abnormality (L hip retraction, lateral trunk shift to R side). While standing with 2" step under R foot, pt exhibited improved midline orientation but significant difficulty initiating RUE lateral reaching.  See FIM for current functional status  Therapy/Group: Co-Treatment  Sarahelizabeth Conway, Malva Cogan 06/25/2014, 7:28 PM

## 2014-06-26 ENCOUNTER — Inpatient Hospital Stay (HOSPITAL_COMMUNITY): Payer: Medicare HMO | Admitting: Occupational Therapy

## 2014-06-26 ENCOUNTER — Ambulatory Visit (HOSPITAL_COMMUNITY): Payer: Medicare Other | Admitting: Speech Pathology

## 2014-06-26 ENCOUNTER — Inpatient Hospital Stay (HOSPITAL_COMMUNITY): Payer: Medicare HMO | Admitting: Physical Therapy

## 2014-06-26 LAB — GLUCOSE, CAPILLARY
GLUCOSE-CAPILLARY: 145 mg/dL — AB (ref 70–99)
GLUCOSE-CAPILLARY: 171 mg/dL — AB (ref 70–99)
Glucose-Capillary: 169 mg/dL — ABNORMAL HIGH (ref 70–99)
Glucose-Capillary: 130 mg/dL — ABNORMAL HIGH (ref 70–99)
Glucose-Capillary: 147 mg/dL — ABNORMAL HIGH (ref 70–99)

## 2014-06-26 NOTE — Progress Notes (Signed)
Occupational Therapy Session Note  Patient Details  Name: [name] MRN: [id number] Date of Birth: Jul 20, 1931  Today's Date: 06/26/2014 OT Individual Time: 7672-0947 OT Individual Time Calculation (min): 65 min    Short Term Goals: Week 2:  OT Short Term Goal 1 (Week 2): Pt will perform toilet transfer with max A OT Short Term Goal 2 (Week 2): Pt will engage in 10 minutes of functional activity before requiring rest break in order to increase endurance. OT Short Term Goal 3 (Week 2): Pt maintain dynamic sitting balance with supervision for 5 minutes during activity. OT Short Term Goal 4 (Week 2): Pt will perform LB dressing with Max A.  OT Short Term Goal 5 (Week 2): Pt will complete UB dressing with Min A.  Skilled Therapeutic Interventions/Progress Updates:   Focused on bed mobility prior to participating in ADL self-care tasks while seated at EOB.  Min A when rolling side-to-side with verbal cues, utilizing bed rails, in order for have buttocks washed with assistance, Pt washing perineal area while in supine position. While sitting at EOB Pt demonstrated bathing task using RUE, encouraged hand-over-hand technique to incorporate LUE when washing and applying deodorant to RUE, as well as when doffing shirt and doffing and donning gown, utilizing hemi-dressing technique, Pt stating "I do not like it."  Educated Pt on the importance of incorporating LUE into daily tasks as much as possible.  Pt displayed increased posterior trunk lean during dynamic sitting tasks, question cuing to correct seated posture.  Therapist washed BLE and applied clean non-skid socks on BLE.  Pt requested to return in supine position in bed at end of session.         Therapy Documentation Precautions:  Precautions Precautions: Fall Restrictions Weight Bearing Restrictions: No General:   Vital Signs:   Pain: Pain Assessment Pain Assessment: No/denies pain ADL:    See FIM for current functional  status  Therapy/Group: Individual Therapy  Dontez Hauss 06/26/2014, 10:38 AM

## 2014-06-26 NOTE — Progress Notes (Signed)
Speech Language Pathology Daily Session Note  Patient Details  Name: [name] MRN: [id number] Date of Birth: [birth year]  Today's Date: 06/26/2014 SLP Individual Time: 1115-1200 SLP Individual Time Calculation (min): 45 min  Short Term Goals: Week 2: SLP Short Term Goal 1 (Week 2): Patient will utilize speech strategies (slow down, over-articulate, pause between words) to achieve >80% intelligibility at phrase level, with minimal cues. SLP Short Term Goal 2 (Week 2): Patient will demonstrate intellectual awareness by identifying at least 1 speech and 1 physical deficit, with moderate cues. SLP Short Term Goal 3 (Week 2): Patient will recall and demonstrate understanding of safety precautions and strategies learned in PT, OT, SLP treatment sessions, with minimal cues. SLP Short Term Goal 4 (Week 2): Patient will solve basic level functional problems wtih 80% accuracy with min assist. SLP Short Term Goal 5 (Week 2): Pt will utilize his recommended swallowing precautions with mod I to minimize overt s/s of aspiration with presentations of his currently prescribed diet.   Skilled Therapeutic Interventions:  Pt was seen for skilled ST targeting cognitive-linguistic goals.  SLP facilitated the session with a structured task targeting basic problem solving and selective attention for a familiar task in a moderately distracting environment.  Pt required overall min assist-supervision to complete task for ~75% accuracy.  Pt was noted to use gestures for functional communication on this date and required min encouragement to initiate voicing and use dysarthria strategies.  Pt was ~50% intelligible at the phrase level during unstructured conversations.  Continue per current plan of care.     FIM:  Comprehension Comprehension Mode: Auditory Comprehension: 5-Understands basic 90% of the time/requires cueing < 10% of the time Expression Expression Mode: Verbal Expression: 4-Expresses basic 75 - 89%  of the time/requires cueing 10 - 24% of the time. Needs helper to occlude trach/needs to repeat words. Social Interaction Social Interaction: 3-Interacts appropriately 50 - 74% of the time - May be physically or verbally inappropriate. Problem Solving Problem Solving: 3-Solves basic 50 - 74% of the time/requires cueing 25 - 49% of the time Memory Memory: 3-Recognizes or recalls 50 - 74% of the time/requires cueing 25 - 49% of the time  Pain Pain Assessment Pain Assessment: No/denies pain  Therapy/Group: Individual Therapy  [name], M.A. CCC-SLP  Yides Saidi, Selinda Orion 06/26/2014, 12:27 PM

## 2014-06-26 NOTE — Progress Notes (Signed)
Physical Therapy Session Note  Patient Details  Name: [name] MRN: [id number] Date of Birth: 05-30-1931  Today's Date: 06/26/2014 PT Individual Time: 3785-8850 and 1414-1431 PT Individual Time Calculation (min): 45 min and 17 min  Short Term Goals: Week 2:  PT Short Term Goal 1 (Week 2): Pt will perform supine>sit with supervision with HOB flat using bed rail. PT Short Term Goal 2 (Week 2): Pt will perform sit>supine with mod A with HOB flat. PT Short Term Goal 3 (Week 2): Pt will transfer from bed<>w/c with min A and 25% cueing for technique. PT Short Term Goal 4 (Week 2): Pt will perform sit>stand with mod A and 50% cueing for technique. PT Short Term Goal 5 (Week 2): Pt will perform gait x25' with max A of single therapist.  Skilled Therapeutic Interventions/Progress Updates:    Treatment Session 1: 1:1. Pt received semi reclined in bed; agreeable to therapy. Session focused on functional transfers and w/c mobility. Pt performed supine>sit (to R side) with HOB flat using rail requiring min A to lift trunk; multimodal cueing for technique and tactile cueing at R ribcage and L iliac crest. Performed lateral scooting transfer from bed>w/c (to L side) with max A, multimodal cueing for full anterior weight shift, no UE's utilized to prevent RUE pushing. Verbal cueing to initiate standing was effective in facilitating active pt lifting/lowering of buttocks during transfer. Explained R hemi technique; however, pt with increased difficulty coordinating movement of RUE/LE. Pt was able to perform w/c mobility x45' in controlled environment with RUE and bilat LE's with mod A initially but with min A during final 25'. Due to timed toileting protocol, performed transfer from w/c>toilet with Total A using Clarise Cruz Plus. Therapist departed with pt seated on toilet with NT present to assist pt off toilet when ready.  Treatment Session 2: 1:1. Pt received seated in w/c with quick release belt on. Pt  reporting having vomited after lunch; RN confirmed. Pt refusing to participate in therapy at this time secondary to nausea. Pt requesting to return to bed. Therefore, pt performed squat pivot transfer from w/c>bed (to L side) with max A and tactile cueing at ribcage bilaterally for full anterior weight shift. With max verbal/demonstration cueing, pt performed sit>supine with HOB flat, no rail by managing LLE using RLE. Once in supine, pt performed scooting to [facility] with R rail, mod A, max cueing for sequencing, and tactile cueing at bilat LE's for weightbearing. Performed rolling to L side with bed rail and supervision, min cueing for initiation. Departed with pt semi reclined in bed with 3 rails up, bed alarm on, and all needs within reach.  Pt missing 13 minutes of skilled physical therapy secondary to pt refusal, nausea. RN aware.  Therapy Documentation Precautions:  Precautions Precautions: Fall Restrictions Weight Bearing Restrictions: No General: PT Amount of Missed Time (min): 13 Minutes PT Missed Treatment Reason: Patient ill (Comment);Other (Comment) (Pt refusal due to nausea. Per RN, pt vomited after lunch) Vital Signs: Therapy Vitals Temp: 97.9 F (36.6 C) Temp src: Oral Pulse Rate: 56 Resp: 18 BP: 132/54 mmHg Patient Position (if appropriate): Lying Oxygen Therapy SpO2: 96 % O2 Device: None (Room air) Pain:  Pt reports no pain during AM and PM physical therapy sessions.  See FIM for current functional status  Therapy/Group: Individual Therapy  Tonya Wantz, Malva Cogan 06/26/2014, 4:30 PM

## 2014-06-26 NOTE — Progress Notes (Signed)
I have reviewed and agree with the attached treatment note.  Kandise Riehle, OTR/L 

## 2014-06-26 NOTE — Progress Notes (Signed)
Subjective/Complaints: 80 y.o. right-handed male with history diabetes mellitus with peripheral neuropathy, CAD status post CABG, atrial flutter on chronic Coumadin, chronic renal insufficiency with baseline creatinine 8.24, chronic diastolic congestive heart failure. Independent with occasional cane and driving living with family. Admitted 06/11/2049 of left-sided weakness and slurred speech as well as recent fall but denies striking his head. Cranial CT scan shows a right pontomedullary 14 x 22 mm oval focus consistent with acute hemorrhage. INR on admission of 2.35 and systolic blood pressure 361. Placed on intravenous Lopressor and Coumadin was reversed. Followup cranial CT scan unchanged. 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 Tolerating a regular consistency diet. Coumadin remains on hold with cardiac rate control.  Pt is not complaining of anything this am Working with OT on bathing Denies CP, SOB, bowel or bladder issues but is unaware of incontinence  Review of Systems - Negative except weak on left  Objective: Vital Signs: Blood pressure 142/69, pulse 54, temperature 98.6 F (37 C), temperature source Oral, resp. rate 17, weight 81.149 kg (178 lb 14.4 oz), SpO2 94.00%. No results found. Results for orders placed during the hospital encounter of 06/14/14 (from the past 72 hour(s))  GLUCOSE, CAPILLARY     Status: Abnormal   Collection Time    06/23/14  8:56 AM      Result Value Ref Range   Glucose-Capillary 110 (*) 70 - 99 mg/dL  GLUCOSE, CAPILLARY     Status: Abnormal   Collection Time    06/23/14 12:03 PM      Result Value Ref Range   Glucose-Capillary 135 (*) 70 - 99 mg/dL  GLUCOSE, CAPILLARY     Status: Abnormal   Collection Time    06/23/14  4:36 PM      Result Value Ref Range   Glucose-Capillary 138 (*) 70 - 99 mg/dL  GLUCOSE, CAPILLARY     Status: Abnormal   Collection Time    06/23/14  8:59 PM      Result Value Ref Range   Glucose-Capillary 176 (*) 70 - 99 mg/dL  GLUCOSE, CAPILLARY     Status: None   Collection Time    06/24/14  6:50 AM      Result Value Ref Range   Glucose-Capillary 94  70 - 99 mg/dL  GLUCOSE, CAPILLARY     Status: Abnormal   Collection Time    06/24/14 11:11 AM      Result Value Ref Range   Glucose-Capillary 134 (*) 70 - 99 mg/dL   Comment 1 Notify RN    URINALYSIS, ROUTINE W REFLEX MICROSCOPIC     Status: Abnormal   Collection Time    06/24/14  1:22 PM      Result Value Ref Range   Color, Urine YELLOW  YELLOW   APPearance CLEAR  CLEAR   Specific Gravity, Urine 1.011  1.005 - 1.030   pH 5.5  5.0 - 8.0   Glucose, UA NEGATIVE  NEGATIVE mg/dL   Hgb urine dipstick SMALL (*) NEGATIVE   Bilirubin Urine NEGATIVE  NEGATIVE   Ketones, ur NEGATIVE  NEGATIVE mg/dL   Protein, ur 100 (*) NEGATIVE mg/dL   Urobilinogen, UA 0.2  0.0 - 1.0 mg/dL   Nitrite NEGATIVE  NEGATIVE   Leukocytes, UA NEGATIVE  NEGATIVE  URINE CULTURE     Status: None   Collection Time    06/24/14  1:22 PM      Result Value Ref Range   Specimen Description URINE, RANDOM  Special Requests NONE     Culture  Setup Time       Value: 06/24/2014 17:10     Performed at [facility] Count       Value: 20,OOO COLONIES/ML     Performed at [facility]   Culture       Value: Multiple bacterial morphotypes present, none predominant. Suggest appropriate recollection if clinically indicated.     Performed at [facility]   Report Status 06/25/2014 FINAL    URINE MICROSCOPIC-ADD ON     Status: None   Collection Time    06/24/14  1:22 PM      Result Value Ref Range   Squamous Epithelial / LPF RARE  RARE   RBC / HPF 0-2  <3 RBC/hpf  GLUCOSE, CAPILLARY     Status: Abnormal   Collection Time    06/24/14  4:38 PM      Result Value Ref Range   Glucose-Capillary 163 (*) 70 - 99 mg/dL  GLUCOSE, CAPILLARY     Status: Abnormal   Collection Time    06/24/14 10:02 PM      Result Value Ref Range    Glucose-Capillary 154 (*) 70 - 99 mg/dL  GLUCOSE, CAPILLARY     Status: Abnormal   Collection Time    06/25/14  6:51 AM      Result Value Ref Range   Glucose-Capillary 106 (*) 70 - 99 mg/dL  GLUCOSE, CAPILLARY     Status: Abnormal   Collection Time    06/25/14 11:18 AM      Result Value Ref Range   Glucose-Capillary 146 (*) 70 - 99 mg/dL   Comment 1 Notify RN    GLUCOSE, CAPILLARY     Status: Abnormal   Collection Time    06/25/14  4:43 PM      Result Value Ref Range   Glucose-Capillary 148 (*) 70 - 99 mg/dL  GLUCOSE, CAPILLARY     Status: Abnormal   Collection Time    06/25/14  9:23 PM      Result Value Ref Range   Glucose-Capillary 169 (*) 70 - 99 mg/dL  GLUCOSE, CAPILLARY     Status: Abnormal   Collection Time    06/26/14  6:26 AM      Result Value Ref Range   Glucose-Capillary 130 (*) 70 - 99 mg/dL   Comment 1 Notify RN       HEENT: normal Cardio: RRR and no murmur Resp: CTA B/L and unlabored GI: BS positive and NT, ND Extremity:  No Edema Skin:   Other Left transerve healed breast incision at nipple level, left upper back incion well healed, dry skin both feet without open areas Neuro: Lethargic, Flat, Cranial Nerve Abnormalities Left central 7, Abnormal Sensory decreased in both feet, Abnormal Motor bilateral hand intrinsic atrophy and Other motor tr Left bi, tri grip, 3- Left HF, KE , 2- ankle DF Musc/Skel:  Other no pain with AROM in BUE and BLE, or with neck movement, sitting balance fair minus Gen NAD   Assessment/Plan: 1. Functional deficits secondary to RIght pontomedullary hemorrhage with Left hemiparesis superimposed on severe diabetic neuropathy which require 3+ hours per day of interdisciplinary therapy in a comprehensive inpatient rehab setting. Physiatrist is providing close team supervision and 24 hour management of active medical problems listed below. Physiatrist and rehab team continue to assess barriers to discharge/monitor patient progress toward  functional and medical goals. Team conference today please  see physician documentation under team conference tab, met with team face-to-face to discuss problems,progress, and goals. Formulized individual treatment plan based on medical history, underlying problem and comorbidities.  FIM: FIM - Bathing Bathing Steps Patient Completed: Chest;Left Arm;Abdomen;Front perineal area;Right upper leg;Left upper leg Bathing: 3: Mod-Patient completes 5-7 44f10 parts or 50-74%  FIM - Upper Body Dressing/Undressing Upper body dressing/undressing steps patient completed: Pull shirt over trunk Upper body dressing/undressing: 2: Max-Patient completed 25-49% of tasks FIM - Lower Body Dressing/Undressing Lower body dressing/undressing steps patient completed: Thread/unthread left pants leg Lower body dressing/undressing: 0: Wears gown/pajamas-no public clothing  FIM - Toileting Toileting: 1: Total-Patient completed zero steps, helper did all 3  FIM - TAir cabin crewTransfers: 1-Mechanical lift (Clarise Cruz  FIM - BControl and instrumentation engineerDevices: Arm rests;Bed rails Bed/Chair Transfer: 4: Supine > Sit: Min A (steadying Pt. > 75%/lift 1 leg);3: Sit > Supine: Mod A (lifting assist/Pt. 50-74%/lift 2 legs);2: Bed > Chair or W/C: Max A (lift and lower assist);1: Two helpers (w/c>bed with +2A)  FIM - Locomotion: Wheelchair Distance: 40 Locomotion: Wheelchair: 1: Total Assistance/staff pushes wheelchair (Pt<25%) FIM - Locomotion: Ambulation Locomotion: Ambulation Assistive Devices: WEthelene HalAmbulation/Gait Assistance: 1: +2 Total assist;3: Mod assist Locomotion: Ambulation: 0: Activity did not occur  Comprehension Comprehension Mode: Auditory Comprehension: 5-Understands basic 90% of the time/requires cueing < 10% of the time  Expression Expression Mode: Verbal Expression: 4-Expresses basic 75 - 89% of the time/requires cueing 10 - 24% of the time. Needs helper to occlude  trach/needs to repeat words.  Social Interaction Social Interaction: 3-Interacts appropriately 50 - 74% of the time - May be physically or verbally inappropriate.  Problem Solving Problem Solving: 3-Solves basic 50 - 74% of the time/requires cueing 25 - 49% of the time  Memory Memory: 3-Recognizes or recalls 50 - 74% of the time/requires cueing 25 - 49% of the time  Medical Problem List and Plan:  1. 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 Latest cranial CT scan stable  2. DVT Prophylaxis/Anticoagulation: Subcutaneous heparin initiated for DVT prophylaxis 06/13/2014  3. Pain Management: Tylenol as needed. Monitor the increased mobility  4. Diabetes mellitus with peripheral neuropathy. Latest hemoglobin A1c 6.7. Check blood sugars a.c. and at bedtime.Tradjenta 5 mg daily.Just on Trajenta now, Patient on Levemir 15 units each bedtime and Onglyza 2.5 mg daily prior to admission. Plan to resume as needed  5. Neuropsych: This patient is not capable of making decisions on his own behalf.  6. Skin/Wound Care: Routine skin checks  7. Chronic renal insufficiency. Baseline creatinine 2.84. Followup chemistries show stable GFR ~20 8. Chronic diastolic congestive heart failure. Monitor for any signs of fluid overload. Patient on Lasix 80 mg Daily.  9. Hypertension. Norvasc 10 mg daily, Coreg 6.25 mg twice a day. Monitor with increased mobility  10. CAD with history of CABG. No chest pain or shortness of breath  11. Hyperlipidemia. Lipitor  12. GERD. Protonix 13.  Possible UTI, check UA C and S  LOS (Days) 12 A FACE TO FACE EVALUATION WAS PERFORMED  KIRSTEINS,ANDREW E 06/26/2014, 7:58 AM

## 2014-06-27 ENCOUNTER — Inpatient Hospital Stay (HOSPITAL_COMMUNITY): Payer: Medicare HMO | Admitting: Physical Therapy

## 2014-06-27 ENCOUNTER — Inpatient Hospital Stay (HOSPITAL_COMMUNITY): Payer: Medicare HMO | Admitting: Occupational Therapy

## 2014-06-27 ENCOUNTER — Inpatient Hospital Stay (HOSPITAL_COMMUNITY): Payer: Medicare Other

## 2014-06-27 ENCOUNTER — Inpatient Hospital Stay (HOSPITAL_COMMUNITY): Payer: Medicare HMO | Admitting: Speech Pathology

## 2014-06-27 DIAGNOSIS — N185 Chronic kidney disease, stage 5: Secondary | ICD-10-CM

## 2014-06-27 DIAGNOSIS — I610 Nontraumatic intracerebral hemorrhage in hemisphere, subcortical: Secondary | ICD-10-CM

## 2014-06-27 DIAGNOSIS — I4891 Unspecified atrial fibrillation: Secondary | ICD-10-CM

## 2014-06-27 LAB — GLUCOSE, CAPILLARY
GLUCOSE-CAPILLARY: 125 mg/dL — AB (ref 70–99)
Glucose-Capillary: 109 mg/dL — ABNORMAL HIGH (ref 70–99)
Glucose-Capillary: 156 mg/dL — ABNORMAL HIGH (ref 70–99)
Glucose-Capillary: 127 mg/dL — ABNORMAL HIGH (ref 70–99)

## 2014-06-27 NOTE — Progress Notes (Signed)
Subjective/Complaints: 80 y.o. right-handed male with history diabetes mellitus with peripheral neuropathy, CAD status post CABG, atrial flutter on chronic Coumadin, chronic renal insufficiency with baseline creatinine 2.29, chronic diastolic congestive heart failure. Independent with occasional cane and driving living with family. Admitted 06/11/2049 of left-sided weakness and slurred speech as well as recent fall but denies striking his head. Cranial CT scan shows a right pontomedullary 14 x 22 mm oval focus consistent with acute hemorrhage. INR on admission of 7.98 and systolic blood pressure 921. Placed on intravenous Lopressor and Coumadin was reversed. Followup cranial CT scan unchanged. 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 Tolerating a regular consistency diet. Coumadin remains on hold with cardiac rate control.  Right arm still weak. Otherwise in good spirits.  Denies CP, SOB, bowel or bladder issues but is unaware of incontinence  Review of Systems - Negative except weak on left  Objective: Vital Signs: Blood pressure 132/54, pulse 46, temperature 98.9 F (37.2 C), temperature source Oral, resp. rate 18, weight 78.472 kg (173 lb), SpO2 100.00%. No results found. Results for orders placed during the hospital encounter of 06/14/14 (from the past 72 hour(s))  URINALYSIS, ROUTINE W REFLEX MICROSCOPIC     Status: Abnormal   Collection Time    06/24/14  1:22 PM      Result Value Ref Range   Color, Urine YELLOW  YELLOW   APPearance CLEAR  CLEAR   Specific Gravity, Urine 1.011  1.005 - 1.030   pH 5.5  5.0 - 8.0   Glucose, UA NEGATIVE  NEGATIVE mg/dL   Hgb urine dipstick SMALL (*) NEGATIVE   Bilirubin Urine NEGATIVE  NEGATIVE   Ketones, ur NEGATIVE  NEGATIVE mg/dL   Protein, ur 100 (*) NEGATIVE mg/dL   Urobilinogen, UA 0.2  0.0 - 1.0 mg/dL   Nitrite NEGATIVE  NEGATIVE   Leukocytes, UA NEGATIVE  NEGATIVE  URINE CULTURE     Status: None   Collection  Time    06/24/14  1:22 PM      Result Value Ref Range   Specimen Description URINE, RANDOM     Special Requests NONE     Culture  Setup Time       Value: 06/24/2014 17:10     Performed at [facility] Count       Value: 20,OOO COLONIES/ML     Performed at [facility]   Culture       Value: Multiple bacterial morphotypes present, none predominant. Suggest appropriate recollection if clinically indicated.     Performed at [facility]   Report Status 06/25/2014 FINAL    URINE MICROSCOPIC-ADD ON     Status: None   Collection Time    06/24/14  1:22 PM      Result Value Ref Range   Squamous Epithelial / LPF RARE  RARE   RBC / HPF 0-2  <3 RBC/hpf  GLUCOSE, CAPILLARY     Status: Abnormal   Collection Time    06/24/14  4:38 PM      Result Value Ref Range   Glucose-Capillary 163 (*) 70 - 99 mg/dL  GLUCOSE, CAPILLARY     Status: Abnormal   Collection Time    06/24/14 10:02 PM      Result Value Ref Range   Glucose-Capillary 154 (*) 70 - 99 mg/dL  GLUCOSE, CAPILLARY     Status: Abnormal   Collection Time    06/25/14  6:51 AM  Result Value Ref Range   Glucose-Capillary 106 (*) 70 - 99 mg/dL  GLUCOSE, CAPILLARY     Status: Abnormal   Collection Time    06/25/14 11:18 AM      Result Value Ref Range   Glucose-Capillary 146 (*) 70 - 99 mg/dL   Comment 1 Notify RN    GLUCOSE, CAPILLARY     Status: Abnormal   Collection Time    06/25/14  4:43 PM      Result Value Ref Range   Glucose-Capillary 148 (*) 70 - 99 mg/dL  GLUCOSE, CAPILLARY     Status: Abnormal   Collection Time    06/25/14  9:23 PM      Result Value Ref Range   Glucose-Capillary 169 (*) 70 - 99 mg/dL  GLUCOSE, CAPILLARY     Status: Abnormal   Collection Time    06/26/14  6:26 AM      Result Value Ref Range   Glucose-Capillary 130 (*) 70 - 99 mg/dL   Comment 1 Notify RN    GLUCOSE, CAPILLARY     Status: Abnormal   Collection Time    06/26/14 11:07 AM      Result Value Ref Range    Glucose-Capillary 145 (*) 70 - 99 mg/dL  GLUCOSE, CAPILLARY     Status: Abnormal   Collection Time    06/26/14  4:17 PM      Result Value Ref Range   Glucose-Capillary 171 (*) 70 - 99 mg/dL  GLUCOSE, CAPILLARY     Status: Abnormal   Collection Time    06/26/14  9:00 PM      Result Value Ref Range   Glucose-Capillary 147 (*) 70 - 99 mg/dL  GLUCOSE, CAPILLARY     Status: Abnormal   Collection Time    06/27/14  6:41 AM      Result Value Ref Range   Glucose-Capillary 109 (*) 70 - 99 mg/dL  GLUCOSE, CAPILLARY     Status: Abnormal   Collection Time    06/27/14 11:21 AM      Result Value Ref Range   Glucose-Capillary 156 (*) 70 - 99 mg/dL   Comment 1 Notify RN       HEENT: normal Cardio: RRR and no murmur Resp: CTA B/L and unlabored GI: BS positive and NT, ND Extremity:  No Edema Skin:   Other Left transerve healed breast incision at nipple level, left upper back incion well healed, dry skin both feet without open areas Neuro: Lethargic, Flat, Cranial Nerve Abnormalities Left central 7, Abnormal Sensory decreased in both feet, Abnormal Motor bilateral hand intrinsic atrophy and Other motor tr Left bi, tri grip, 3- Left HF, KE , 2- ankle DF Musc/Skel:  Other no pain with AROM in BUE and BLE, or with neck movement, sitting balance fair minus Gen NAD   Assessment/Plan: 1. Functional deficits secondary to RIght pontomedullary hemorrhage with Left hemiparesis superimposed on severe diabetic neuropathy which require 3+ hours per day of interdisciplinary therapy in a comprehensive inpatient rehab setting. Physiatrist is providing close team supervision and 24 hour management of active medical problems listed below. Physiatrist and rehab team continue to assess barriers to discharge/monitor patient progress toward functional and medical goals. Team conference today please see physician documentation under team conference tab, met with team face-to-face to discuss problems,progress, and  goals. Formulized individual treatment plan based on medical history, underlying problem and comorbidities.  FIM: FIM - Bathing Bathing Steps Patient Completed: Chest;Left Arm;Abdomen;Front perineal area;Left upper  leg;Right upper leg Bathing: 3: Mod-Patient completes 5-7 68f 10 parts or 50-74%  FIM - Upper Body Dressing/Undressing Upper body dressing/undressing steps patient completed: Pull shirt over trunk Upper body dressing/undressing: 0: Wears gown/pajamas-no public clothing FIM - Lower Body Dressing/Undressing Lower body dressing/undressing steps patient completed: Thread/unthread left pants leg Lower body dressing/undressing: 0: Wears gown/pajamas-no public clothing  FIM - Toileting Toileting: 1: Total-Patient completed zero steps, helper did all 3  FIM - Air cabin crew Transfers: 1-Mechanical lift;1-From toilet/BSC: Total A (helper does all/Pt. < 25%) Clarise Cruz Plus)  FIM - Bed/Chair Transfer Bed/Chair Transfer Assistive Devices: Arm rests;Bed rails Bed/Chair Transfer: 4: Supine > Sit: Min A (steadying Pt. > 75%/lift 1 leg);3: Bed > Chair or W/C: Mod A (lift or lower assist)  FIM - Locomotion: Wheelchair Distance: 45 Locomotion: Wheelchair: 1: Total Assistance/staff pushes wheelchair (Pt<25%) FIM - Locomotion: Ambulation Locomotion: Ambulation Assistive Devices: Ethelene Hal Ambulation/Gait Assistance: 1: +2 Total assist;3: Mod assist Locomotion: Ambulation: 0: Activity did not occur  Comprehension Comprehension Mode: Auditory Comprehension: 5-Understands basic 90% of the time/requires cueing < 10% of the time  Expression Expression Mode: Verbal Expression: 4-Expresses basic 75 - 89% of the time/requires cueing 10 - 24% of the time. Needs helper to occlude trach/needs to repeat words.  Social Interaction Social Interaction: 3-Interacts appropriately 50 - 74% of the time - May be physically or verbally inappropriate.  Problem Solving Problem Solving: 2-Solves  basic 25 - 49% of the time - needs direction more than half the time to initiate, plan or complete simple activities  Memory Memory: 2-Recognizes or recalls 25 - 49% of the time/requires cueing 51 - 75% of the time  Medical Problem List and Plan:  1. 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 Latest cranial CT scan stable  2. DVT Prophylaxis/Anticoagulation: Subcutaneous heparin initiated for DVT prophylaxis 06/13/2014  3. Pain Management: Tylenol as needed. Monitor the increased mobility  4. Diabetes mellitus with peripheral neuropathy. Latest hemoglobin A1c 6.7. Check blood sugars a.c. and at bedtime.Tradjenta 5 mg daily.Just on Trajenta now, Patient on Levemir 15 units each bedtime and Onglyza 2.5 mg daily prior to admission.    -sugars under fair control at present 5. Neuropsych: This patient is not capable of making decisions on his own behalf.  6. Skin/Wound Care: Routine skin checks  7. Chronic renal insufficiency. Baseline creatinine 2.84. Followup chemistries show stable GFR ~20 8. Chronic diastolic congestive heart failure. Monitor for any signs of fluid overload. Patient on Lasix 80 mg Daily.  9. Hypertension. Norvasc 10 mg daily, Coreg 6.25 mg twice a day. Monitor with increased mobility  10. CAD with history of CABG. No chest pain or shortness of breath  11. Hyperlipidemia. Lipitor  12. GERD. Protonix 13.  Possible UTI, ucx neg  LOS (Days) 13 A FACE TO FACE EVALUATION WAS PERFORMED  SWARTZ,ZACHARY T 06/27/2014, 11:26 AM

## 2014-06-27 NOTE — Progress Notes (Signed)
Physical Therapy Session Note  Patient Details  Name: [name] MRN: [id number] Date of Birth: [birth year]  Today's Date: 06/27/2014 PT Individual Time: 0931-1031 PT Individual Time Calculation (min): 60 min   Short Term Goals: Week 2:  PT Short Term Goal 1 (Week 2): Pt will perform supine>sit with supervision with HOB flat using bed rail. PT Short Term Goal 2 (Week 2): Pt will perform sit>supine with mod A with HOB flat. PT Short Term Goal 3 (Week 2): Pt will transfer from bed<>w/c with min A and 25% cueing for technique. PT Short Term Goal 4 (Week 2): Pt will perform sit>stand with mod A and 50% cueing for technique. PT Short Term Goal 5 (Week 2): Pt will perform gait x25' with max A of single therapist.  Skilled Therapeutic Interventions/Progress Updates:    2:1. Pt received seated on toilet with RN present; agreeable to therapy. Static standing x3 minutes for hygiene, donning brief with Total A using Sara Plus. Transferred from toilet>w/c with total A using Clarise Cruz Plus. Remainder of session focused on increasing co-contraction and motor control in proximal muscle groups and addressing ataxia in RUE/LE. Transported pt to gym in w/c with total A for energy conservation. NMR interventions performed in tall kneeling with bilat UE support at large bench with RUE anterolateral reaching for functional weight shifting, increased R trunk elongation, and to promote midline orientation. Manual stabilization provided at LUE to maintain safe position on step. Manual facilitation provided for bilat hip extension. Rest breaks taken by propping bilat forearms on large bench. Longest trial in tall kneeling without break: 4 consecutive minutes. Transitioned from tall kneeling > quadruped > R side lying with +2A. Lateral scooting transfers performed from mat table>w/c (to L side) with mod A; w/c >bed (to R side) with max A. Session ended in pt room, where pt was left semi reclined in bed with 3 rails up, bed  alarm on, and all needs within reach.  Therapy Documentation Precautions:  Precautions Precautions: Fall Restrictions Weight Bearing Restrictions: No Vital Signs: Therapy Vitals Pulse Rate: 46 BP: 132/54 mmHg Patient Position (if appropriate): Lying Pain: Pain Assessment Pain Assessment: No/denies pain Pain Score: 0-No pain Other Treatments: Treatments Neuromuscular Facilitation: Right;Left;Upper Extremity;Lower Extremity;Activity to increase lateral weight shifting;Activity to increase sustained activation;Activity to increase anterior-posterior weight shifting;Activity to increase motor control;Activity to increase grading;Activity to increase coordination Weight Bearing Technique Weight Bearing Technique: Yes RUE Weight Bearing Technique: High kneeling;Quadruped LUE Weight Bearing Technique: High kneeling;Quadruped  See FIM for current functional status  Therapy/Group: Individual Therapy  Genesi Stefanko, Malva Cogan 06/27/2014, 10:45 AM

## 2014-06-27 NOTE — Progress Notes (Signed)
Occupational Therapy Note  Patient Details  Name: [name] MRN: [id number] Date of Birth: 02/11/1931  Today's Date: 06/27/2014 OT Individual Time: 1300-1330 OT Individual Time Calculation (min): 30 min   Pt denied pain Individual Therapy  Pt resting in bed upon arrival and agreeable to participating in therapy.  Pt able to move BLE to edge of bed without assistance.  Pt required mod A for supine->sit EOB in preparation for scoot/squat pivot transfer to w/c.  Pt performed transfer with mod A and increased manual facilitation to maintain posture in flexed position when pushing through BLE.  Pt engaged in taking steps (5 X 1 and 3 X 1) with tot A + 2 (pt=25%).  Pt exhibits increased Lt hip flexor tone and RLE adductor tone when standing.     Leotis Shames [facility] 06/27/2014, 2:42 PM

## 2014-06-27 NOTE — Progress Notes (Signed)
Occupational Therapy Session Note  Patient Details  Name: [name] MRN: [id number] Date of Birth: 1931-04-13  Today's Date: 06/27/2014 OT Individual [id number] and  [id number] OT Individual Time Calculation (min): 20 min and 10 min    Short Term Goals: Week 2:  OT Short Term Goal 1 (Week 2): Pt will perform toilet transfer with max A OT Short Term Goal 2 (Week 2): Pt will engage in 10 minutes of functional activity before requiring rest break in order to increase endurance. OT Short Term Goal 3 (Week 2): Pt maintain dynamic sitting balance with supervision for 5 minutes during activity. OT Short Term Goal 4 (Week 2): Pt will perform LB dressing with Max A.  OT Short Term Goal 5 (Week 2): Pt will complete UB dressing with Min A.    Skilled Therapeutic Interventions/Progress Updates:  Session 1: Pt supine in bed with family member present this session. Pt with no c/o pain. Pt requiring coaxing for participation and stating, "I am not getting out of bed again. I just got back into bed." OT discussing OT purpose and goals with patient. OT educated pt on energy conservation techniques for general principles and ADLs as pt reports being "tired from all this work." Pt was active participate in discussion regarding energy conservation techniques but continued to get EOB of OOB during this time. Pt supine in bed with call bell and family present as therapist exiting room.   Session 2: Pt continues to be supine in bed with family member present and no c/o pain. Pt agreeable to getting out of bed from next session with speech therapist. Pt performed supine > sit with Min A for trunk. Squat pivot transfer with Max A from bed >w/c. Quick release belt applied, call bell within reach, and awaiting SLP upon exiting the room.   Therapy Documentation Precautions:  Precautions Precautions: Fall Restrictions Weight Bearing Restrictions: No  Pain: Pain Assessment Pain Assessment: No/denies  pain  See FIM for current functional status  Therapy/Group: Individual Therapy  Phineas Semen 06/27/2014, 4:27 PM

## 2014-06-27 NOTE — Progress Notes (Signed)
Occupational Therapy Session Note  Patient Details  Name: RAUNEL DIMARTINO MRN: [id number] Date of Birth: [birth year]  Today's Date: 06/27/2014 OT Individual Time: 0174-9449 OT Individual Time Calculation (min): 60 min    Short Term Goals: Week 2:  OT Short Term Goal 1 (Week 2): Pt will perform toilet transfer with max A OT Short Term Goal 2 (Week 2): Pt will engage in 10 minutes of functional activity before requiring rest break in order to increase endurance. OT Short Term Goal 3 (Week 2): Pt maintain dynamic sitting balance with supervision for 5 minutes during activity. OT Short Term Goal 4 (Week 2): Pt will perform LB dressing with Max A.  OT Short Term Goal 5 (Week 2): Pt will complete UB dressing with Min A.  Skilled Therapeutic Interventions/Progress Updates:   Engaged in bed mobility, rolling side-to-side to wash bottom and change brief, supine > sitting at EOB with Min A with verbal cues to WB through Rt elbow upon sitting up.  Transferred EOB > w/c with Max A with verbal cues to lean trunk forward prior to transferring with squat/scoot pivot transfer technique.  Engaged in bathing task at w/c level in front of sink, provided hand-over-hand technique while incorporating shoulder internal rotation when washing Rt arm and to apply deodorant.  Sit <> stand from w/c in front of sink with +2 due to decreased static standing balance, providing verbal cues to "stand tall" and mirror for visual cues.  Agreeable to remain seated in w/c, waiting for breakfast, at end of session.    Therapy Documentation Precautions:  Precautions Precautions: Fall Restrictions Weight Bearing Restrictions: No General:   Vital Signs: Therapy Vitals Pulse Rate: 46 BP: 132/54 mmHg Patient Position (if appropriate): Lying Pain: Pain Assessment Pain Assessment: No/denies pain Pain Score: 0-No pain ADL:    See FIM for current functional status  Therapy/Group: Individual  Therapy  Jilberto Vanderwall 06/27/2014, 10:44 AM

## 2014-06-27 NOTE — Progress Notes (Signed)
Physical Therapy Note  Patient Details  Name: [name] MRN: [id number] Date of Birth: 04-Oct-1930 Today's Date: 06/27/2014    Patient received supine in bed. Attempted 30 minute make up session at 10:25am, however, patient refused stating "I just got back from therapy." Will follow up as able.  [place] Hashem Goynes, PT, DPT 06/27/2014, 10:51 AM

## 2014-06-27 NOTE — Progress Notes (Signed)
Social Work Patient ID: [name], male   DOB: 07-Jan-1931, 80 y.o.   MRN: [id number]  CSW met with pt to update him on team conference discussion.  Pt is starting to recognize that he is going to need physical assistance at d/c and has arranged for two women, along with his grandson to be with him.  CSW questioned if the women would be able to provide the physical assistance he will need.  Pt will need to follow up on this.  CSW spoke with pt about the possibility of pt needing SNF prior to returning home, if he cannot arrange care at home, as it is still his preference to return home at d/c.  Pt will think about SNF option and will talk with CSW about this.  CSW will continue to follow pt and assist as needed.

## 2014-06-27 NOTE — Patient Care Conference (Signed)
Inpatient RehabilitationTeam Conference and Plan of Care Update Date: 06/26/2014   Time: 10:45 AM    Patient Name: [name]      Medical Record Number: [id number]  Date of Birth: [birth year] Sex: Male         Room/Bed: 4W23C/4W23C-01 Payor Info: Payor: [employer] MEDICARE / Plan: [place] [employer] MEDICARE HMO/PPO / Product Type: *No Product type* /    Admitting Diagnosis: R CVA  Admit Date/Time:  06/14/2014  5:42 PM Admission Comments: No comment available   Primary Diagnosis:  <principal problem not specified> Principal Problem: <principal problem not specified>  Patient Active Problem List   Diagnosis Date Noted  . ICH (intracerebral hemorrhage) 06/14/2014  . Stroke due to intracerebral hemorrhage 06/11/2014  . Atrial fibrillation 11/20/2013  . Chronic diastolic congestive heart failure 11/20/2013  . Atrial flutter 06/25/2013  . Long term (current) use of anticoagulants 06/25/2013  . CKD (chronic kidney disease) stage 5, GFR less than 15 ml/min 08/29/2012  . Pulmonary arterial hypertension 07/29/2012  . Anemia, iron deficiency 01/25/2012  . AV block, Mobitz 1 01/10/2012  . CARDIOMYOPATHY, ISCHEMIC 10/22/2010  . HEADACHE 01/23/2009  . DIABETIC PERIPHERAL NEUROPATHY 07/24/2008  . ABDOMINAL PAIN, RIGHT UPPER QUADRANT 10/23/2007  . DIABETES MELLITUS 06/24/2007  . HYPERLIPIDEMIA 06/24/2007  . CORONARY ARTERY DISEASE 06/24/2007  . OSTEOARTHRITIS 06/24/2007  . CEREBROVASCULAR DISEASE, HX OF 06/24/2007  . STATUS, OTHER TOE(S) AMPUTATION 06/24/2007  . HYPERTENSION 05/13/2007  . CORONARY ARTERY BYPASS GRAFT, HX OF 11/25/1994    Expected Discharge Date: Expected Discharge Date: 07/12/14  Team Members Present: Physician leading conference: Dr. Alysia Penna Social Worker Present: Alfonse Alpers, LCSW;Becky DuPree, LCSW Nurse Present: Heather Roberts, RN PT Present: Billie Ruddy, Cecille Rubin, Elisabeth Pigeon, PT OT Present: Simonne Come, OT SLP Present: [name], SLP PPS Coordinator  present : Daiva Nakayama, RN, CRRN     Current Status/Progress Goal Weekly Team Focus  Medical   incont at night, improved awareness of bowel and bladder needs  maintain bowel cont  timed toileting   Bowel/Bladder   can be incont. of bladder. Timed toileting during  the day with continent of b/b. Condom cath HS due to incont. Brief inplace.   mod assist for toileting  contiune timed tolieting   Swallow/Nutrition/ Hydration   full supervision with reg/thin   mod I with least restrictive diet   trials of intermittent supervision    ADL's   Min assist for sitting at EOB, total assist overall, +2 for transfers and LB dressing at sit > stand level  min assist overall  bed mobility, Lt NMR, sit <> stand, transfers, attention to tasks   Mobility   Min-Max A bed mobility, Max A transfers, +2A gait x20'  Min A overall  Functional transfers, bed mobility, gait training, midline orientation, postural control, NMR   Communication   min assist for use of dysarthria strategies   supervision   carryover of compensatory strategies at the phrase/sentence level    Safety/Cognition/ Behavioral Observations  min-mod assist   supervision   memory, self monitoring, attention, awareness, problem solving    Pain   headache at times. tylenol 650mg  PRN q4hrs  report pain less than 3   assess pain qshift, medicate as needed   Skin   two skin tears to scrotum. epbc applied. dry flakey feet-eurcerin cream ordered. abrasion to left thigh-old blister   stay free of further skin breakdown with min assist  monitor skin q shift and continue epbc     Rehab  Goals Patient on target to meet rehab goals: Yes Rehab Goals Revised: none *See Care Plan and progress notes for long and short-term goals.  Barriers to Discharge: still requires heavy assistance    Possible Resolutions to Barriers:  ?SNF vs home with family    Discharge Planning/Teaching Needs:  Pt continues to want to go to his home with family/friends  assisting him 24/7.  Family is unsure if they can provide 24/7 care, but pt wants to be in charge of make his own arrangements.  He is becoming more aware of the needs he will have at d/c.  Team will provide family education to family/friends who are in place to provide 24/7 care.    Team Discussion:  Pt has not had any medical changes.  He is doing well with timed toileting with the nursing staff during the day, but is still having some incontinence at night.  Nursing will continue to work on this.  Pt has more awareness of being wet now.  Pt is doing some standing, but is pushing and it is hard to get him at midline.  Pt still has some perception problems.  OT feels pt is able to use call button for his needs, but doesn't call for help as often as he may need it.  SLP is seeing a delay in pt's processing and is cognition is slow to improve.  Pt does have carryover with repetition.    Revisions to Treatment Plan:  none   Continued Need for Acute Rehabilitation Level of Care: The patient requires daily medical management by a physician with specialized training in physical medicine and rehabilitation for the following conditions: Daily direction of a multidisciplinary physical rehabilitation program to ensure safe treatment while eliciting the highest outcome that is of practical value to the patient.: Yes Daily medical management of patient stability for increased activity during participation in an intensive rehabilitation regime.: Yes Daily analysis of laboratory values and/or radiology reports with any subsequent need for medication adjustment of medical intervention for : Neurological problems;Other  Maude Gloor, Silvestre Mesi 06/27/2014, 12:05 PM

## 2014-06-27 NOTE — Progress Notes (Signed)
Speech Language Pathology Daily Session Note  Patient Details  Name: [name] MRN: [id number] Date of Birth: Feb 28, 1931  Today's Date: 06/27/2014 SLP Individual Time: 1530-1600 SLP Individual Time Calculation (min): 30 min  Short Term Goals: Week 2: SLP Short Term Goal 1 (Week 2): Patient will utilize speech strategies (slow down, over-articulate, pause between words) to achieve >80% intelligibility at phrase level, with minimal cues. SLP Short Term Goal 2 (Week 2): Patient will demonstrate intellectual awareness by identifying at least 1 speech and 1 physical deficit, with moderate cues. SLP Short Term Goal 3 (Week 2): Patient will recall and demonstrate understanding of safety precautions and strategies learned in PT, OT, SLP treatment sessions, with minimal cues. SLP Short Term Goal 4 (Week 2): Patient will solve basic level functional problems wtih 80% accuracy with min assist. SLP Short Term Goal 5 (Week 2): Pt will utilize his recommended swallowing precautions with mod I to minimize overt s/s of aspiration with presentations of his currently prescribed diet.   Skilled Therapeutic Interventions: Skilled treatment session focused on addressing initiation of speech and vocal intensity.  Patient required Mod faded to Min cues to utilize "loud speech" during a structured description with utterances at the phrase length.  Continue with current plan of care.    FIM:  Comprehension Comprehension Mode: Auditory Comprehension: 5-Understands basic 90% of the time/requires cueing < 10% of the time Expression Expression Mode: Verbal Expression: 4-Expresses basic 75 - 89% of the time/requires cueing 10 - 24% of the time. Needs helper to occlude trach/needs to repeat words. Social Interaction Social Interaction: 3-Interacts appropriately 50 - 74% of the time - May be physically or verbally inappropriate. Problem Solving Problem Solving: 3-Solves basic 50 - 74% of the time/requires cueing  25 - 49% of the time Memory Memory: 3-Recognizes or recalls 50 - 74% of the time/requires cueing 25 - 49% of the time  Pain Pain Assessment Pain Assessment: No/denies pain  Therapy/Group: Individual Therapy  Carmelia Roller., Ronda [phone]  [place] 06/27/2014, 4:12 PM

## 2014-06-27 NOTE — Progress Notes (Signed)
I have reviewed and agree with the attached treatment note.  Zayne Draheim, OTR/L 

## 2014-06-28 ENCOUNTER — Ambulatory Visit (HOSPITAL_COMMUNITY): Payer: Medicare Other | Admitting: Speech Pathology

## 2014-06-28 ENCOUNTER — Inpatient Hospital Stay (HOSPITAL_COMMUNITY): Payer: Medicare HMO | Admitting: Occupational Therapy

## 2014-06-28 ENCOUNTER — Inpatient Hospital Stay (HOSPITAL_COMMUNITY): Payer: Medicare HMO | Admitting: Physical Therapy

## 2014-06-28 DIAGNOSIS — I5032 Chronic diastolic (congestive) heart failure: Secondary | ICD-10-CM

## 2014-06-28 DIAGNOSIS — I619 Nontraumatic intracerebral hemorrhage, unspecified: Secondary | ICD-10-CM

## 2014-06-28 LAB — GLUCOSE, CAPILLARY
GLUCOSE-CAPILLARY: 96 mg/dL (ref 70–99)
GLUCOSE-CAPILLARY: 209 mg/dL — AB (ref 70–99)
Glucose-Capillary: 250 mg/dL — ABNORMAL HIGH (ref 70–99)
Glucose-Capillary: 109 mg/dL — ABNORMAL HIGH (ref 70–99)

## 2014-06-28 NOTE — Progress Notes (Signed)
Occupational Therapy Session Note  Patient Details  Name: [name] MRN: [id number] Date of Birth: 09-14-1931  Today's Date: 06/28/2014 OT Individual Time: 9741-6384 OT Individual Time Calculation (min): 65 min    Short Term Goals: Week 2:  OT Short Term Goal 1 (Week 2): Pt will perform toilet transfer with max A OT Short Term Goal 2 (Week 2): Pt will engage in 10 minutes of functional activity before requiring rest break in order to increase endurance. OT Short Term Goal 3 (Week 2): Pt maintain dynamic sitting balance with supervision for 5 minutes during activity. OT Short Term Goal 4 (Week 2): Pt will perform LB dressing with Max A.  OT Short Term Goal 5 (Week 2): Pt will complete UB dressing with Min A.  Skilled Therapeutic Interventions/Progress Updates:   Engaged in bed mobility with Pt rolling side-to-side using bed rails in order for buttocks to be washed and to change brief, Pt able to wash perineal area while lying in supine.  Min A with supine > sit, +2 with squat/scoot pivot transfer to w/c for safety/to stabilize w/c prior to engaging in bathing task in front of sink at w/c level.  Focused on NMR with hand-over-hand technique, steady A for Lt arm to assist with elbow flexion and shoulder flexion in order to reach across chest to wash RUE and apply deodorant.  Brushed teeth seated in w/c, incorporating Lt hand using hand-over-hand technique to encourage grasp of toothpaste while using Rt hand to open cap.             Therapy Documentation Precautions:  Precautions Precautions: Fall Restrictions Weight Bearing Restrictions: No General:   Vital Signs:   Pain: Pain Assessment Pain Assessment: No/denies pain   Other Treatments:    See FIM for current functional status  Therapy/Group: Individual Therapy  Destinie Thornsberry 06/28/2014, 12:08 PM

## 2014-06-28 NOTE — Progress Notes (Signed)
Physical Therapy Session Note  Patient Details  Name: [name] MRN: [id number] Date of Birth: 06-21-1931  Today's Date: 06/28/2014 PT Individual Time: 0930-1030 PT Individual Time Calculation (min): 60 min   Short Term Goals: Week 2:  PT Short Term Goal 1 (Week 2): Pt will perform supine>sit with supervision with HOB flat using bed rail. PT Short Term Goal 2 (Week 2): Pt will perform sit>supine with mod A with HOB flat. PT Short Term Goal 3 (Week 2): Pt will transfer from bed<>w/c with min A and 25% cueing for technique. PT Short Term Goal 4 (Week 2): Pt will perform sit>stand with mod A and 50% cueing for technique. PT Short Term Goal 5 (Week 2): Pt will perform gait x25' with max A of single therapist.  Skilled Therapeutic Interventions/Progress Updates:    2:1. Pt received seated in w/c accompanied by RN; agreeable to therapy. Session addressed functional transfers with focus on increasing pt initiation of anterior weight shift as well as progressing toilet transfer without use of mechanical lift. Pt performed lateral scooting transfer from w/c>toilet (to L side) with max A overall then from toilet>w/c (to R side) with total A using hand over back technique; additional person present for safety. Multimodal cueing required for setup, full anterior weight shift. Performed sit>stand with max A and stand>sit with +2A for forward weight shift, controlled descent. Max A required for static standing during hygiene, pulling up brief performed by second person. Remainder of session focused on lateral scooting transfers from w/c<>bed with max-total A, multimodal cueing focused on pt initiation of forward weight shift and increasing pt independence with setup, including management of LUE using RUE (to maintain safe LUE position and prevent RUE pushing); cueing as described above for toilet transfer. Pt able to recall 25-50% of setup with max questioning cues. Departed with pt seated in w/c with quick  release belt in place for safety and all needs within reach.  Precautions:  Precautions Precautions: Fall Restrictions Weight Bearing Restrictions: No Pain: Pain Assessment Pain Assessment: No/denies pain  See FIM for current functional status  Therapy/Group: Individual Therapy  Taneya Conkel, Malva Cogan 06/28/2014, 10:46 AM

## 2014-06-28 NOTE — Progress Notes (Signed)
Subjective/Complaints: 80 y.o. right-handed male with history diabetes mellitus with peripheral neuropathy, CAD status post CABG, atrial flutter on chronic Coumadin, chronic renal insufficiency with baseline creatinine 3.38, chronic diastolic congestive heart failure. Independent with occasional cane and driving living with family. Admitted 06/11/2049 of left-sided weakness and slurred speech as well as recent fall but denies striking his head. Cranial CT scan shows a right pontomedullary 14 x 22 mm oval focus consistent with acute hemorrhage. INR on admission of 2.50 and systolic blood pressure 539. Placed on intravenous Lopressor and Coumadin was reversed. Followup cranial CT scan unchanged. 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 Tolerating a regular consistency diet. Coumadin remains on hold with cardiac rate control.  No new complaints Denies CP, SOB, bowel or bladder issues but is unaware of incontinence  Review of Systems - Negative except weak on left  Objective: Vital Signs: Blood pressure 139/53, pulse 52, temperature 97.4 F (36.3 C), temperature source Axillary, resp. rate 17, weight 78.472 kg (173 lb), SpO2 97.00%. No results found. Results for orders placed during the hospital encounter of 06/14/14 (from the past 72 hour(s))  GLUCOSE, CAPILLARY     Status: Abnormal   Collection Time    06/25/14 11:18 AM      Result Value Ref Range   Glucose-Capillary 146 (*) 70 - 99 mg/dL   Comment 1 Notify RN    GLUCOSE, CAPILLARY     Status: Abnormal   Collection Time    06/25/14  4:43 PM      Result Value Ref Range   Glucose-Capillary 148 (*) 70 - 99 mg/dL  GLUCOSE, CAPILLARY     Status: Abnormal   Collection Time    06/25/14  9:23 PM      Result Value Ref Range   Glucose-Capillary 169 (*) 70 - 99 mg/dL  GLUCOSE, CAPILLARY     Status: Abnormal   Collection Time    06/26/14  6:26 AM      Result Value Ref Range   Glucose-Capillary 130 (*) 70 - 99  mg/dL   Comment 1 Notify RN    GLUCOSE, CAPILLARY     Status: Abnormal   Collection Time    06/26/14 11:07 AM      Result Value Ref Range   Glucose-Capillary 145 (*) 70 - 99 mg/dL  GLUCOSE, CAPILLARY     Status: Abnormal   Collection Time    06/26/14  4:17 PM      Result Value Ref Range   Glucose-Capillary 171 (*) 70 - 99 mg/dL  GLUCOSE, CAPILLARY     Status: Abnormal   Collection Time    06/26/14  9:00 PM      Result Value Ref Range   Glucose-Capillary 147 (*) 70 - 99 mg/dL  GLUCOSE, CAPILLARY     Status: Abnormal   Collection Time    06/27/14  6:41 AM      Result Value Ref Range   Glucose-Capillary 109 (*) 70 - 99 mg/dL  GLUCOSE, CAPILLARY     Status: Abnormal   Collection Time    06/27/14 11:21 AM      Result Value Ref Range   Glucose-Capillary 156 (*) 70 - 99 mg/dL   Comment 1 Notify RN    GLUCOSE, CAPILLARY     Status: Abnormal   Collection Time    06/27/14  4:23 PM      Result Value Ref Range   Glucose-Capillary 127 (*) 70 - 99 mg/dL  GLUCOSE, CAPILLARY  Status: Abnormal   Collection Time    06/27/14  8:55 PM      Result Value Ref Range   Glucose-Capillary 125 (*) 70 - 99 mg/dL  GLUCOSE, CAPILLARY     Status: None   Collection Time    06/28/14  6:17 AM      Result Value Ref Range   Glucose-Capillary 96  70 - 99 mg/dL   Comment 1 Notify RN       HEENT: normal Cardio: RRR and no murmur Resp: CTA B/L and unlabored GI: BS positive and NT, ND Extremity:  No Edema Skin:   Other Left transerve healed breast incision at nipple level, left upper back incion well healed, dry skin both feet without open areas Neuro: Lethargic, Flat, Cranial Nerve Abnormalities Left central 7, Abnormal Sensory decreased in both feet, Abnormal Motor bilateral hand intrinsic atrophy and Other motor tr Left bi, tri grip, 3- Left HF, KE , 2- ankle DF Musc/Skel:  Other no pain with AROM in BUE and BLE, or with neck movement, sitting balance fair minus Gen NAD   Assessment/Plan: 1.  Functional deficits secondary to RIght pontomedullary hemorrhage with Left hemiparesis superimposed on severe diabetic neuropathy which require 3+ hours per day of interdisciplinary therapy in a comprehensive inpatient rehab setting. Physiatrist is providing close team supervision and 24 hour management of active medical problems listed below. Physiatrist and rehab team continue to assess barriers to discharge/monitor patient progress toward functional and medical goals. Team conference today please see physician documentation under team conference tab, met with team face-to-face to discuss problems,progress, and goals. Formulized individual treatment plan based on medical history, underlying problem and comorbidities.  FIM: FIM - Bathing Bathing Steps Patient Completed: Chest;Left Arm;Abdomen;Front perineal area;Left upper leg;Right upper leg Bathing: 3: Mod-Patient completes 5-7 86f 10 parts or 50-74%  FIM - Upper Body Dressing/Undressing Upper body dressing/undressing steps patient completed: Pull shirt over trunk Upper body dressing/undressing: 0: Wears gown/pajamas-no public clothing FIM - Lower Body Dressing/Undressing Lower body dressing/undressing steps patient completed: Thread/unthread left pants leg Lower body dressing/undressing: 0: Wears gown/pajamas-no public clothing  FIM - Toileting Toileting: 1: Two helpers  FIM - Air cabin crew Transfers: 1-Mechanical lift;1-From toilet/BSC: Total A (helper does all/Pt. < 25%) Clarise Cruz Plus)  FIM - Bed/Chair Transfer Bed/Chair Transfer Assistive Devices: Arm rests;Bed rails Bed/Chair Transfer: 4: Supine > Sit: Min A (steadying Pt. > 75%/lift 1 leg);2: Bed > Chair or W/C: Max A (lift and lower assist)  FIM - Locomotion: Wheelchair Distance: 45 Locomotion: Wheelchair: 1: Total Assistance/staff pushes wheelchair (Pt<25%) FIM - Locomotion: Ambulation Locomotion: Ambulation Assistive Devices: Nutritional therapist Ambulation/Gait Assistance:  1: +2 Total assist;3: Mod assist Locomotion: Ambulation: 0: Activity did not occur  Comprehension Comprehension Mode: Auditory Comprehension: 5-Follows basic conversation/direction: With extra time/assistive device  Expression Expression Mode: Verbal Expression: 4-Expresses basic 75 - 89% of the time/requires cueing 10 - 24% of the time. Needs helper to occlude trach/needs to repeat words.  Social Interaction Social Interaction: 3-Interacts appropriately 50 - 74% of the time - May be physically or verbally inappropriate.  Problem Solving Problem Solving: 3-Solves basic 50 - 74% of the time/requires cueing 25 - 49% of the time  Memory Memory: 3-Recognizes or recalls 50 - 74% of the time/requires cueing 25 - 49% of the time  Medical Problem List and Plan:  1. 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 Latest cranial CT scan stable  2. DVT Prophylaxis/Anticoagulation: Subcutaneous heparin initiated for DVT  prophylaxis 06/13/2014  3. Pain Management: Tylenol as needed. Monitor the increased mobility  4. Diabetes mellitus with peripheral neuropathy. Latest hemoglobin A1c 6.7. Check blood sugars a.c. and at bedtime.Tradjenta 5 mg daily.Just on Trajenta now, Patient on Levemir 15 units each bedtime and Onglyza 2.5 mg daily prior to admission.    -sugars under fair control at present 5. Neuropsych: This patient is not capable of making decisions on his own behalf.  6. Skin/Wound Care: Routine skin checks  7. Chronic renal insufficiency. Baseline creatinine 2.84. Followup chemistries show stable GFR ~20  -recheck labwork tomorrow 8. Chronic diastolic congestive heart failure. Monitor for any signs of fluid overload. Patient on Lasix 80 mg Daily.  9. Hypertension. Norvasc 10 mg daily, Coreg 6.25 mg twice a day. Monitor with increased mobility  10. CAD with history of CABG. No chest pain or shortness of breath  11. Hyperlipidemia. Lipitor   12. GERD. Protonix 13.  Possible UTI, ucx neg  LOS (Days) 14 A FACE TO FACE EVALUATION WAS PERFORMED   SWARTZ,ZACHARY T 06/28/2014, 10:13 AM

## 2014-06-28 NOTE — Progress Notes (Signed)
Speech Language Pathology Daily Session Note  Patient Details  Name: [name] MRN: [id number] Date of Birth: 10/01/1930  Today's Date: 06/28/2014 SLP Individual Time: 1100-1200 SLP Individual Time Calculation (min): 60 min  Short Term Goals: Week 2: SLP Short Term Goal 1 (Week 2): Patient will utilize speech strategies (slow down, over-articulate, pause between words) to achieve >80% intelligibility at phrase level, with minimal cues. SLP Short Term Goal 2 (Week 2): Patient will demonstrate intellectual awareness by identifying at least 1 speech and 1 physical deficit, with moderate cues. SLP Short Term Goal 3 (Week 2): Patient will recall and demonstrate understanding of safety precautions and strategies learned in PT, OT, SLP treatment sessions, with minimal cues. SLP Short Term Goal 4 (Week 2): Patient will solve basic level functional problems wtih 80% accuracy with min assist. SLP Short Term Goal 5 (Week 2): Pt will utilize his recommended swallowing precautions with mod I to minimize overt s/s of aspiration with presentations of his currently prescribed diet.   Skilled Therapeutic Interventions:  Pt was seen for skilled ST targeting goals for dysphagia and cognition.  Upon arrival, pt was upright in wheelchair, awake, alert, and agreeable to participate in [place].  SLP facilitated the session with a previously taught card game targeting selective attention in a moderately distracting environment, basic problem solving, and recall for new information.  Pt recalled rules of game with overall min assist question cues and completed the activity with min assist faded to supervision verbal cues for self monitoring and correcting errors.  During both structured and unstructured tasks, pt was noted with attempts to communicate via gestures and pointing to indicate needs/wants and benefited from mod encouragement for use of voice to communicate.  SLP completed skilled observations with presentations  of pt's currently prescribed diet to assess readiness for decrease in supervision from full to intermittent during meals.  SLP completed skilled observations during presentations of pt's currently prescribed diet with pt exhibiting mod I use of swallowing precautions and no overt s/s of aspiration.  Recommend pt be decreased to intermittent supervision with meals; nurse tech made aware.    FIM:  Comprehension Comprehension Mode: Auditory Comprehension: 5-Follows basic conversation/direction: With extra time/assistive device Expression Expression Mode: Verbal Expression: 4-Expresses basic 75 - 89% of the time/requires cueing 10 - 24% of the time. Needs helper to occlude trach/needs to repeat words. Social Interaction Social Interaction: 3-Interacts appropriately 50 - 74% of the time - May be physically or verbally inappropriate. Problem Solving Problem Solving: 2-Solves basic 25 - 49% of the time - needs direction more than half the time to initiate, plan or complete simple activities Memory Memory: 3-Recognizes or recalls 50 - 74% of the time/requires cueing 25 - 49% of the time FIM - Eating Eating Activity: 6: More than reasonable amount of time  Pain Pain Assessment Pain Assessment: No/denies pain  Therapy/Group: Individual Therapy  [name], M.A. CCC-SLP   Santana Gosdin, Selinda Orion 06/28/2014, 12:20 PM

## 2014-06-28 NOTE — Progress Notes (Signed)
Reviewed and agree with the attached treatment note.  Simonne Come, OTR/L

## 2014-06-29 ENCOUNTER — Inpatient Hospital Stay (HOSPITAL_COMMUNITY): Payer: Medicare HMO | Admitting: Physical Therapy

## 2014-06-29 DIAGNOSIS — I10 Essential (primary) hypertension: Secondary | ICD-10-CM

## 2014-06-29 DIAGNOSIS — E1121 Type 2 diabetes mellitus with diabetic nephropathy: Secondary | ICD-10-CM

## 2014-06-29 LAB — BASIC METABOLIC PANEL
Anion gap: 14 (ref 5–15)
BUN: 46 mg/dL — AB (ref 6–23)
CALCIUM: 9.9 mg/dL (ref 8.4–10.5)
CO2: 22 mEq/L (ref 19–32)
CREATININE: 3.56 mg/dL — AB (ref 0.50–1.35)
Chloride: 101 mEq/L (ref 96–112)
GFR calc non Af Amer: 15 mL/min — ABNORMAL LOW (ref >90)
GFR, EST AFRICAN AMERICAN: 17 mL/min — AB (ref >90)
Glucose, Bld: 94 mg/dL (ref 70–99)
Potassium: 4.9 mEq/L (ref 3.7–5.3)
Sodium: 137 mEq/L (ref 137–147)

## 2014-06-29 LAB — GLUCOSE, CAPILLARY
GLUCOSE-CAPILLARY: 133 mg/dL — AB (ref 70–99)
Glucose-Capillary: 96 mg/dL (ref 70–99)
Glucose-Capillary: 135 mg/dL — ABNORMAL HIGH (ref 70–99)
Glucose-Capillary: 115 mg/dL — ABNORMAL HIGH (ref 70–99)

## 2014-06-29 NOTE — Progress Notes (Signed)
Subjective/Complaints: 80 y.o. right-handed male with history diabetes mellitus with peripheral neuropathy, CAD status post CABG, atrial flutter on chronic Coumadin, chronic renal insufficiency with baseline creatinine 0.93, chronic diastolic congestive heart failure. Independent with occasional cane and driving living with family. Admitted 06/11/2049 of left-sided weakness and slurred speech as well as recent fall but denies striking his head. Cranial CT scan shows a right pontomedullary 14 x 22 mm oval focus consistent with acute hemorrhage. INR on admission of 8.18 and systolic blood pressure 299. Placed on intravenous Lopressor and Coumadin was reversed. Followup cranial CT scan unchanged. 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 Tolerating a regular consistency diet. Coumadin remains on hold with cardiac rate control.  No new complaints Denies CP, SOB, bowel or bladder issues but is unaware of incontinence  Review of Systems - Negative except weak on left  Objective: Vital Signs: Blood pressure 130/58, pulse 54, temperature 98.4 F (36.9 C), temperature source Oral, resp. rate 18, weight 78.472 kg (173 lb), SpO2 99.00%. No results found. Results for orders placed during the hospital encounter of 06/14/14 (from the past 72 hour(s))  GLUCOSE, CAPILLARY     Status: Abnormal   Collection Time    06/26/14 11:07 AM      Result Value Ref Range   Glucose-Capillary 145 (*) 70 - 99 mg/dL  GLUCOSE, CAPILLARY     Status: Abnormal   Collection Time    06/26/14  4:17 PM      Result Value Ref Range   Glucose-Capillary 171 (*) 70 - 99 mg/dL  GLUCOSE, CAPILLARY     Status: Abnormal   Collection Time    06/26/14  9:00 PM      Result Value Ref Range   Glucose-Capillary 147 (*) 70 - 99 mg/dL  GLUCOSE, CAPILLARY     Status: Abnormal   Collection Time    06/27/14  6:41 AM      Result Value Ref Range   Glucose-Capillary 109 (*) 70 - 99 mg/dL  GLUCOSE, CAPILLARY      Status: Abnormal   Collection Time    06/27/14 11:21 AM      Result Value Ref Range   Glucose-Capillary 156 (*) 70 - 99 mg/dL   Comment 1 Notify RN    GLUCOSE, CAPILLARY     Status: Abnormal   Collection Time    06/27/14  4:23 PM      Result Value Ref Range   Glucose-Capillary 127 (*) 70 - 99 mg/dL  GLUCOSE, CAPILLARY     Status: Abnormal   Collection Time    06/27/14  8:55 PM      Result Value Ref Range   Glucose-Capillary 125 (*) 70 - 99 mg/dL  GLUCOSE, CAPILLARY     Status: None   Collection Time    06/28/14  6:17 AM      Result Value Ref Range   Glucose-Capillary 96  70 - 99 mg/dL   Comment 1 Notify RN    GLUCOSE, CAPILLARY     Status: Abnormal   Collection Time    06/28/14 11:28 AM      Result Value Ref Range   Glucose-Capillary 209 (*) 70 - 99 mg/dL   Comment 1 Notify RN    GLUCOSE, CAPILLARY     Status: Abnormal   Collection Time    06/28/14  4:29 PM      Result Value Ref Range   Glucose-Capillary 250 (*) 70 - 99 mg/dL  GLUCOSE, CAPILLARY  Status: Abnormal   Collection Time    06/28/14  9:27 PM      Result Value Ref Range   Glucose-Capillary 109 (*) 70 - 99 mg/dL  BASIC METABOLIC PANEL     Status: Abnormal   Collection Time    06/29/14  5:49 AM      Result Value Ref Range   Sodium 137  137 - 147 mEq/L   Potassium 4.9  3.7 - 5.3 mEq/L   Chloride 101  96 - 112 mEq/L   CO2 22  19 - 32 mEq/L   Glucose, Bld 94  70 - 99 mg/dL   BUN 46 (*) 6 - 23 mg/dL   Creatinine, Ser 3.56 (*) 0.50 - 1.35 mg/dL   Calcium 9.9  8.4 - 10.5 mg/dL   GFR calc non Af Amer 15 (*) >90 mL/min   GFR calc Af Amer 17 (*) >90 mL/min   Comment: (NOTE)     The eGFR has been calculated using the CKD EPI equation.     This calculation has not been validated in all clinical situations.     eGFR's persistently <90 mL/min signify possible Chronic Kidney     Disease.   Anion gap 14  5 - 15  GLUCOSE, CAPILLARY     Status: None   Collection Time    06/29/14  6:47 AM      Result Value Ref  Range   Glucose-Capillary 96  70 - 99 mg/dL   Comment 1 Notify RN       HEENT: normal Cardio: RRR and no murmur Resp: CTA B/L and unlabored GI: BS positive and NT, ND Extremity:  No Edema Skin:   Other Left transerve healed breast incision at nipple level, left upper back incion well healed, dry skin both feet without open areas Neuro: Lethargic, Flat, Cranial Nerve Abnormalities Left central 7, Abnormal Sensory decreased in both feet, Abnormal Motor bilateral hand intrinsic atrophy and Other motor tr Left bi, tri grip, 3- Left HF, KE , 2- ankle DF Musc/Skel:  Other no pain with AROM in BUE and BLE, or with neck movement, sitting balance fair minus Gen NAD   Assessment/Plan: 1. Functional deficits secondary to RIght pontomedullary hemorrhage with Left hemiparesis superimposed on severe diabetic neuropathy which require 3+ hours per day of interdisciplinary therapy in a comprehensive inpatient rehab setting. Physiatrist is providing close team supervision and 24 hour management of active medical problems listed below. Physiatrist and rehab team continue to assess barriers to discharge/monitor patient progress toward functional and medical goals. Team conference today please see physician documentation under team conference tab, met with team face-to-face to discuss problems,progress, and goals. Formulized individual treatment plan based on medical history, underlying problem and comorbidities.  FIM: FIM - Bathing Bathing Steps Patient Completed: Chest;Left Arm;Abdomen;Front perineal area;Right upper leg;Left upper leg Bathing: 3: Mod-Patient completes 5-7 65f10 parts or 50-74%  FIM - Upper Body Dressing/Undressing Upper body dressing/undressing steps patient completed: Pull shirt over trunk Upper body dressing/undressing: 0: Wears gown/pajamas-no public clothing FIM - Lower Body Dressing/Undressing Lower body dressing/undressing steps patient completed: Thread/unthread left pants  leg Lower body dressing/undressing: 0: Wears gown/pajamas-no public clothing  FIM - Toileting Toileting: 1: Two helpers  FIM - TAir cabin crewTransfers: 1-From toilet/BSC: Total A (helper does all/Pt. < 25%);2-To toilet/BSC: Max A (lift and lower assist);1-Two helpers (Additional person present for safety)  FIM - BControl and instrumentation engineerDevices: Bed rails;Arm rests Bed/Chair Transfer: 4: Supine > Sit: Min  A (steadying Pt. > 75%/lift 1 leg);2: Bed > Chair or W/C: Max A (lift and lower assist);1: Two helpers  FIM - Locomotion: Wheelchair Distance: 45 Locomotion: Wheelchair: 0: Activity did not occur FIM - Locomotion: Ambulation Locomotion: Ambulation Assistive Devices: Nutritional therapist Ambulation/Gait Assistance: 1: +2 Total assist;3: Mod assist Locomotion: Ambulation: 0: Activity did not occur  Comprehension Comprehension Mode: Auditory Comprehension: 5-Follows basic conversation/direction: With extra time/assistive device  Expression Expression Mode: Verbal Expression: 4-Expresses basic 75 - 89% of the time/requires cueing 10 - 24% of the time. Needs helper to occlude trach/needs to repeat words.  Social Interaction Social Interaction: 3-Interacts appropriately 50 - 74% of the time - May be physically or verbally inappropriate.  Problem Solving Problem Solving: 2-Solves basic 25 - 49% of the time - needs direction more than half the time to initiate, plan or complete simple activities  Memory Memory: 3-Recognizes or recalls 50 - 74% of the time/requires cueing 25 - 49% of the time  Medical Problem List and Plan:  1. 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 Latest cranial CT scan stable  2. DVT Prophylaxis/Anticoagulation: Subcutaneous heparin initiated for DVT prophylaxis 06/13/2014  3. Pain Management: Tylenol as needed. Monitor the increased mobility  4. Diabetes mellitus with peripheral  neuropathy. Latest hemoglobin A1c 6.7. Check blood sugars a.c. and at bedtime.Tradjenta 5 mg daily.Just on Trajenta now, Patient on Levemir 15 units each bedtime and Onglyza 2.5 mg daily prior to admission.    -consider resuming levemir if sugars remain elevated 5. Neuropsych: This patient is not capable of making decisions on his own behalf.  6. Skin/Wound Care: Routine skin checks  7. Chronic renal insufficiency. Baseline creatinine 2.84. Followup chemistries show stable GFR ~20  -Cr up to 3.56 today  -need more regular weights--check today  -held lasix (already given today)  -encourage fluids today 8. Chronic diastolic congestive heart failure. Monitor for any signs of fluid overload. Patient on Lasix 80 mg Daily.(held given #7)  9. Hypertension. Norvasc 10 mg daily, Coreg 6.25 mg twice a day. Monitor with increased mobility  10. CAD with history of CABG. No chest pain or shortness of breath  11. Hyperlipidemia. Lipitor  12. GERD. Protonix 13.  Possible UTI, ucx neg  LOS (Days) 15 A FACE TO FACE EVALUATION WAS PERFORMED   Shylee Durrett T 06/29/2014, 9:38 AM

## 2014-06-29 NOTE — Progress Notes (Addendum)
Physical Therapy Session Note  Patient Details  Name: [name] MRN: [id number] Date of Birth: [birth year]  Today's Date: 06/29/2014 PT Individual Time: 1109-1209 PT Individual Time Calculation (min): 60 min   Short Term Goals: Week 2:  PT Short Term Goal 1 (Week 2): Pt will perform supine>sit with supervision with HOB flat using bed rail. PT Short Term Goal 2 (Week 2): Pt will perform sit>supine with mod A with HOB flat. PT Short Term Goal 3 (Week 2): Pt will transfer from bed<>w/c with min A and 25% cueing for technique. PT Short Term Goal 4 (Week 2): Pt will perform sit>stand with mod A and 50% cueing for technique. PT Short Term Goal 5 (Week 2): Pt will perform gait x25' with max A of single therapist.  Skilled Therapeutic Interventions/Progress Updates:    2:1. Pt received semi reclined in bed; agreeable to therapy. Pt performed supine>sit with min A using bed rail with HOB flat, 75% cueing for sequencing. Lateral scooting transfers performed from bed>w/c>mat table without slide board requiring max A to L side and total A to R side, multimodal cueing for full anterior weight shift. Pt required 50% cueing for recall of setup of basic transfer.   In treatment gym, pt performed gait 1x12', 1x20' in controlled environment with EVA walker (to promote upright posture) with mod A overall and +2A for manual advancement of EVA walker. Therapist positioned at pt's L side initially providing manual repositioning and LLE (to correct excessive LLE adduction); pt able to utilize mirror for visual feedback, self-correction of L step placement during final 10' of gait. Multimodal cueing focused on increased lateral weight shift to R side. Pt demonstrated improved posture, more normalized step length bilaterally, and increased speed of gait.  Pt performed self-propulsion of w/c backwards with bilat LE's (to increase quadriceps control, facilitate bilat reciprocal LE flexion/extension pattern) with 1 lb  cuff weights at bilat ankles for increased proprioception, weightbearing; required mod A, manual facilitation at L hamstrings for L knee flexion. Session ended in pt room, where pt was left seated in w/c with quick release belt in place for safety and all needs within reach.  Therapy Documentation Precautions:  Precautions Precautions: Fall Restrictions Weight Bearing Restrictions: No Pain:  Pt reports no pain during PT session. Locomotion : Ambulation Ambulation/Gait Assistance: 1: +2 Total assist;3: Mod assist Wheelchair Mobility Distance: 25   See FIM for current functional status  Therapy/Group: Individual Therapy  Shyna Duignan, Malva Cogan 06/29/2014, 12:13 PM

## 2014-06-30 ENCOUNTER — Inpatient Hospital Stay (HOSPITAL_COMMUNITY): Payer: Medicare Other

## 2014-06-30 DIAGNOSIS — D509 Iron deficiency anemia, unspecified: Secondary | ICD-10-CM

## 2014-06-30 LAB — BASIC METABOLIC PANEL
Anion gap: 13 (ref 5–15)
BUN: 50 mg/dL — ABNORMAL HIGH (ref 6–23)
CO2: 23 mEq/L (ref 19–32)
Calcium: 9.7 mg/dL (ref 8.4–10.5)
Chloride: 101 mEq/L (ref 96–112)
Creatinine, Ser: 3.56 mg/dL — ABNORMAL HIGH (ref 0.50–1.35)
GFR calc Af Amer: 17 mL/min — ABNORMAL LOW (ref >90)
GFR, EST NON AFRICAN AMERICAN: 15 mL/min — AB (ref >90)
Glucose, Bld: 104 mg/dL — ABNORMAL HIGH (ref 70–99)
POTASSIUM: 4.9 meq/L (ref 3.7–5.3)
SODIUM: 137 meq/L (ref 137–147)

## 2014-06-30 LAB — GLUCOSE, CAPILLARY
GLUCOSE-CAPILLARY: 110 mg/dL — AB (ref 70–99)
Glucose-Capillary: 111 mg/dL — ABNORMAL HIGH (ref 70–99)
Glucose-Capillary: 138 mg/dL — ABNORMAL HIGH (ref 70–99)
Glucose-Capillary: 126 mg/dL — ABNORMAL HIGH (ref 70–99)

## 2014-06-30 NOTE — Progress Notes (Addendum)
Subjective/Complaints: 80 y.o. right-handed male with history diabetes mellitus with peripheral neuropathy, CAD status post CABG, atrial flutter on chronic Coumadin, chronic renal insufficiency with baseline creatinine 2.84, chronic diastolic congestive heart failure. Independent with occasional cane and driving living with family. Admitted 06/11/2049 of left-sided weakness and slurred speech as well as recent fall but denies striking his head. Cranial CT scan shows a right pontomedullary 14 x 22 mm oval focus consistent with acute hemorrhage. INR on admission of 2.10 and systolic blood pressure 170. Placed on intravenous Lopressor and Coumadin was reversed. Followup cranial CT scan unchanged. 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 Tolerating a regular consistency diet. Coumadin remains on hold with cardiac rate control.  No new problems. Denies dizziness,  CP, SOB    Review of Systems - Negative except weak on left  Objective: Vital Signs: Blood pressure 136/56, pulse 50, temperature 98 F (36.7 C), temperature source Oral, resp. rate 18, weight 78.472 kg (173 lb), SpO2 99.00%. No results found. Results for orders placed during the hospital encounter of 06/14/14 (from the past 72 hour(s))  GLUCOSE, CAPILLARY     Status: Abnormal   Collection Time    06/27/14 11:21 AM      Result Value Ref Range   Glucose-Capillary 156 (*) 70 - 99 mg/dL   Comment 1 Notify RN    GLUCOSE, CAPILLARY     Status: Abnormal   Collection Time    06/27/14  4:23 PM      Result Value Ref Range   Glucose-Capillary 127 (*) 70 - 99 mg/dL  GLUCOSE, CAPILLARY     Status: Abnormal   Collection Time    06/27/14  8:55 PM      Result Value Ref Range   Glucose-Capillary 125 (*) 70 - 99 mg/dL  GLUCOSE, CAPILLARY     Status: None   Collection Time    06/28/14  6:17 AM      Result Value Ref Range   Glucose-Capillary 96  70 - 99 mg/dL   Comment 1 Notify RN    GLUCOSE, CAPILLARY      Status: Abnormal   Collection Time    06/28/14 11:28 AM      Result Value Ref Range   Glucose-Capillary 209 (*) 70 - 99 mg/dL   Comment 1 Notify RN    GLUCOSE, CAPILLARY     Status: Abnormal   Collection Time    06/28/14  4:29 PM      Result Value Ref Range   Glucose-Capillary 250 (*) 70 - 99 mg/dL  GLUCOSE, CAPILLARY     Status: Abnormal   Collection Time    06/28/14  9:27 PM      Result Value Ref Range   Glucose-Capillary 109 (*) 70 - 99 mg/dL  BASIC METABOLIC PANEL     Status: Abnormal   Collection Time    06/29/14  5:49 AM      Result Value Ref Range   Sodium 137  137 - 147 mEq/L   Potassium 4.9  3.7 - 5.3 mEq/L   Chloride 101  96 - 112 mEq/L   CO2 22  19 - 32 mEq/L   Glucose, Bld 94  70 - 99 mg/dL   BUN 46 (*) 6 - 23 mg/dL   Creatinine, Ser 9.39 (*) 0.50 - 1.35 mg/dL   Calcium 9.9  8.4 - 68.8 mg/dL   GFR calc non Af Amer 15 (*) >90 mL/min   GFR calc Af Amer 17 (*) >  90 mL/min   Comment: (NOTE)     The eGFR has been calculated using the CKD EPI equation.     This calculation has not been validated in all clinical situations.     eGFR's persistently <90 mL/min signify possible Chronic Kidney     Disease.   Anion gap 14  5 - 15  GLUCOSE, CAPILLARY     Status: None   Collection Time    06/29/14  6:47 AM      Result Value Ref Range   Glucose-Capillary 96  70 - 99 mg/dL   Comment 1 Notify RN    GLUCOSE, CAPILLARY     Status: Abnormal   Collection Time    06/29/14 12:35 PM      Result Value Ref Range   Glucose-Capillary 135 (*) 70 - 99 mg/dL  GLUCOSE, CAPILLARY     Status: Abnormal   Collection Time    06/29/14  5:08 PM      Result Value Ref Range   Glucose-Capillary 115 (*) 70 - 99 mg/dL  GLUCOSE, CAPILLARY     Status: Abnormal   Collection Time    06/29/14  9:13 PM      Result Value Ref Range   Glucose-Capillary 133 (*) 70 - 99 mg/dL  BASIC METABOLIC PANEL     Status: Abnormal   Collection Time    06/30/14  4:25 AM      Result Value Ref Range   Sodium 137   137 - 147 mEq/L   Potassium 4.9  3.7 - 5.3 mEq/L   Chloride 101  96 - 112 mEq/L   CO2 23  19 - 32 mEq/L   Glucose, Bld 104 (*) 70 - 99 mg/dL   BUN 50 (*) 6 - 23 mg/dL   Creatinine, Ser 3.56 (*) 0.50 - 1.35 mg/dL   Calcium 9.7  8.4 - 10.5 mg/dL   GFR calc non Af Amer 15 (*) >90 mL/min   GFR calc Af Amer 17 (*) >90 mL/min   Comment: (NOTE)     The eGFR has been calculated using the CKD EPI equation.     This calculation has not been validated in all clinical situations.     eGFR's persistently <90 mL/min signify possible Chronic Kidney     Disease.   Anion gap 13  5 - 15     HEENT: normal Cardio: RRR and no murmur Resp: CTA B/L and unlabored GI: BS positive and NT, ND Extremity:  No Edema Skin:   Other Left transerve healed breast incision at nipple level, left upper back incion well healed, dry skin both feet without open areas Neuro: Lethargic, Flat, Cranial Nerve Abnormalities Left central 7, Abnormal Sensory decreased in both feet, Abnormal Motor bilateral hand intrinsic atrophy and Other motor tr Left bi, tri grip, 3- Left HF, KE , 2- ankle DF Musc/Skel:  Other no pain with AROM in BUE and BLE, or with neck movement, sitting balance fair minus Gen NAD   Assessment/Plan: 1. Functional deficits secondary to RIght pontomedullary hemorrhage with Left hemiparesis superimposed on severe diabetic neuropathy which require 3+ hours per day of interdisciplinary therapy in a comprehensive inpatient rehab setting. Physiatrist is providing close team supervision and 24 hour management of active medical problems listed below. Physiatrist and rehab team continue to assess barriers to discharge/monitor patient progress toward functional and medical goals. Team conference today please see physician documentation under team conference tab, met with team face-to-face to discuss problems,progress, and goals. Formulized  individual treatment plan based on medical history, underlying problem and  comorbidities.  FIM: FIM - Bathing Bathing Steps Patient Completed: Chest;Left Arm;Abdomen;Front perineal area;Right upper leg;Left upper leg Bathing: 3: Mod-Patient completes 5-7 75f 10 parts or 50-74%  FIM - Upper Body Dressing/Undressing Upper body dressing/undressing steps patient completed: Pull shirt over trunk Upper body dressing/undressing: 0: Wears gown/pajamas-no public clothing FIM - Lower Body Dressing/Undressing Lower body dressing/undressing steps patient completed: Thread/unthread left pants leg Lower body dressing/undressing: 0: Wears gown/pajamas-no public clothing  FIM - Toileting Toileting: 1: Two helpers  FIM - Air cabin crew Transfers: 1-From toilet/BSC: Total A (helper does all/Pt. < 25%);2-To toilet/BSC: Max A (lift and lower assist);1-Two helpers (Additional person present for safety)  FIM - Control and instrumentation engineer Devices: Bed rails;Arm rests Bed/Chair Transfer: 4: Supine > Sit: Min A (steadying Pt. > 75%/lift 1 leg);2: Bed > Chair or W/C: Max A (lift and lower assist);1: Chair or W/C > Bed: Total A (helper does all/Pt. < 25%)  FIM - Locomotion: Wheelchair Distance: 25 Locomotion: Wheelchair: 1: Travels less than 50 ft with moderate assistance (Pt: 50 - 74%) FIM - Locomotion: Ambulation Locomotion: Ambulation Assistive Devices: Ethelene Hal Ambulation/Gait Assistance: 1: +2 Total assist;3: Mod assist Locomotion: Ambulation: 1: Two helpers  Comprehension Comprehension Mode: Auditory Comprehension: 5-Follows basic conversation/direction: With extra time/assistive device  Expression Expression Mode: Verbal Expression: 4-Expresses basic 75 - 89% of the time/requires cueing 10 - 24% of the time. Needs helper to occlude trach/needs to repeat words.  Social Interaction Social Interaction: 3-Interacts appropriately 50 - 74% of the time - May be physically or verbally inappropriate.  Problem Solving Problem Solving: 2-Solves  basic 25 - 49% of the time - needs direction more than half the time to initiate, plan or complete simple activities  Memory Memory: 3-Recognizes or recalls 50 - 74% of the time/requires cueing 25 - 49% of the time  Medical Problem List and Plan:  1. 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 Latest cranial CT scan stable  2. DVT Prophylaxis/Anticoagulation: Subcutaneous heparin initiated for DVT prophylaxis 06/13/2014  3. Pain Management: Tylenol as needed. Monitor the increased mobility  4. Diabetes mellitus with peripheral neuropathy. Latest hemoglobin A1c 6.7. Check blood sugars a.c. and at bedtime.Tradjenta 5 mg daily.Just on Trajenta now, Patient on Levemir 15 units each bedtime and Onglyza 2.5 mg daily prior to admission.    -consider resuming levemir if sugars remain elevated 5. Neuropsych: This patient is not capable of making decisions on his own behalf.  6. Skin/Wound Care: Routine skin checks  7. Chronic renal insufficiency. Baseline creatinine 2.84. Followup chemistries show stable GFR ~20  -Cr up to 3.56 today  -need more regular weights--check today  -holding lasix today (received the Saturday dose)  -continue to encourage fluids    -recheck bmet tomorrow 8. Chronic diastolic congestive heart failure. Monitor for any signs of fluid overload. Patient on Lasix 80 mg Daily.(held given #7)  9. Hypertension. Norvasc 10 mg daily, Coreg 6.25 mg twice a day. Monitor with increased mobility  10. CAD with history of CABG. No chest pain or shortness of breath  11. Hyperlipidemia. Lipitor  12. GERD. Protonix 13.  Possible UTI: ucx neg  LOS (Days) 16 A FACE TO FACE EVALUATION WAS PERFORMED   SWARTZ,ZACHARY T 06/30/2014, 8:37 AM

## 2014-06-30 NOTE — Progress Notes (Signed)
Physical Therapy Session Note  Patient Details  Name: [name] MRN: [id number] Date of Birth: 02/14/1931  Today's Date: 06/30/2014 PT Individual Time: 1445-1518 PT Individual Time Calculation (min): 33 min   Short Term Goals: Week 2:  PT Short Term Goal 1 (Week 2): Pt will perform supine>sit with supervision with HOB flat using bed rail. PT Short Term Goal 2 (Week 2): Pt will perform sit>supine with mod A with HOB flat. PT Short Term Goal 3 (Week 2): Pt will transfer from bed<>w/c with min A and 25% cueing for technique. PT Short Term Goal 4 (Week 2): Pt will perform sit>stand with mod A and 50% cueing for technique. PT Short Term Goal 5 (Week 2): Pt will perform gait x25' with max A of single therapist.  Skilled Therapeutic Interventions/Progress Updates:    Pt received seated in recliner, agreeable to participate in therapy. Pt transferred recliner<> w/c w/ use of SARA lift. TotalA to transport pt to rehab gym via w/c. Worked on LE coordination w/ each foot rolling basketball forward/backward and w/ R foot moving in circles clockwise/counterclockwise, mirror in front of pt for visual feedback. Improved performance w/ use of 4# ankle weights, very ataxic movements on RLE that resolved w/ therapist placing hand on knee (no pressure applied). Pt stood w/ STEDI lift, engaged in PNF technique of alternating isometrics with pt seated on high perch in STEDI lift. Pt able to achieve good trunk activation in all directions except w/ therapist applying resistance on R shoulder, even w/ mod verbal cueing to resist motion. Pt transported back to room, left seated in recliner w/ QRB on w/ all needs within reach.  Therapy Documentation Precautions:  Precautions Precautions: Fall Restrictions Weight Bearing Restrictions: No General:   Vital Signs: Therapy Vitals Temp: 98 F (36.7 C) Temp Source: Oral Pulse Rate: 50 Resp: 18 BP: 136/56 mmHg Patient Position (if appropriate): Lying Oxygen  Therapy SpO2: 99 % Pain: Pain Assessment Pain Assessment: No/denies pain Pain Score: 0-No pain Mobility:   Locomotion :    Trunk/Postural Assessment :    Balance:   Exercises:   Other Treatments:    See FIM for current functional status  Therapy/Group: Individual Therapy  Rada Hay Rada Hay, PT, DPT 06/30/2014, 7:57 AM

## 2014-07-01 ENCOUNTER — Inpatient Hospital Stay (HOSPITAL_COMMUNITY): Payer: Medicare HMO | Admitting: Occupational Therapy

## 2014-07-01 ENCOUNTER — Inpatient Hospital Stay (HOSPITAL_COMMUNITY): Payer: Medicare Other | Admitting: Speech Pathology

## 2014-07-01 DIAGNOSIS — N185 Chronic kidney disease, stage 5: Secondary | ICD-10-CM

## 2014-07-01 DIAGNOSIS — I5030 Unspecified diastolic (congestive) heart failure: Secondary | ICD-10-CM

## 2014-07-01 DIAGNOSIS — I610 Nontraumatic intracerebral hemorrhage in hemisphere, subcortical: Secondary | ICD-10-CM

## 2014-07-01 DIAGNOSIS — I10 Essential (primary) hypertension: Secondary | ICD-10-CM

## 2014-07-01 DIAGNOSIS — I613 Nontraumatic intracerebral hemorrhage in brain stem: Secondary | ICD-10-CM

## 2014-07-01 DIAGNOSIS — G8110 Spastic hemiplegia affecting unspecified side: Secondary | ICD-10-CM

## 2014-07-01 DIAGNOSIS — I4891 Unspecified atrial fibrillation: Secondary | ICD-10-CM

## 2014-07-01 LAB — GLUCOSE, CAPILLARY
GLUCOSE-CAPILLARY: 220 mg/dL — AB (ref 70–99)
GLUCOSE-CAPILLARY: 125 mg/dL — AB (ref 70–99)
Glucose-Capillary: 95 mg/dL (ref 70–99)
Glucose-Capillary: 91 mg/dL (ref 70–99)
Glucose-Capillary: 138 mg/dL — ABNORMAL HIGH (ref 70–99)

## 2014-07-01 LAB — BASIC METABOLIC PANEL
Anion gap: 14 (ref 5–15)
BUN: 49 mg/dL — ABNORMAL HIGH (ref 6–23)
CALCIUM: 9.9 mg/dL (ref 8.4–10.5)
CO2: 21 mEq/L (ref 19–32)
Chloride: 102 mEq/L (ref 96–112)
Creatinine, Ser: 3.41 mg/dL — ABNORMAL HIGH (ref 0.50–1.35)
GFR calc Af Amer: 18 mL/min — ABNORMAL LOW (ref >90)
GFR, EST NON AFRICAN AMERICAN: 16 mL/min — AB (ref >90)
GLUCOSE: 99 mg/dL (ref 70–99)
Potassium: 5 mEq/L (ref 3.7–5.3)
Sodium: 137 mEq/L (ref 137–147)

## 2014-07-01 NOTE — Progress Notes (Signed)
Occupational Therapy Session Note  Patient Details  Name: [name] MRN: [id number] Date of Birth: 08-16-1931  Today's Date: 07/01/2014 OT Co-Treatment Time: 1105 (Co-treat with PT entire time: 1035-1135)-1135 OT Co-Treatment Time Calculation (min): 30 min   Short Term Goals: Week 2:  OT Short Term Goal 1 (Week 2): Pt will perform toilet transfer with max A OT Short Term Goal 2 (Week 2): Pt will engage in 10 minutes of functional activity before requiring rest break in order to increase endurance. OT Short Term Goal 3 (Week 2): Pt maintain dynamic sitting balance with supervision for 5 minutes during activity. OT Short Term Goal 4 (Week 2): Pt will perform LB dressing with Max A.  OT Short Term Goal 5 (Week 2): Pt will complete UB dressing with Min A.  Skilled Therapeutic Interventions/Progress Updates:   Engaged in skilled co-treat with focus on squat pivot transfers from w/c <> toilet and sit <> stand in order to be assisted with doffing/donning of brief and to wipe buttocks while engaging in toileting task, verbal and physical cues provided to encourage Pt to lean trunk forward prior to standing and to "push through legs" to achieve standing.  Focused on functional transfers from w/c <> EOB that can be utilized in order to increase overall performance within OT treatment sessions prior to engaging in ADL self-care tasks such as forward weight shift tasks with sit <> stand and LB dressing transfers.  Grandson present and plans to take clothes home to wash and return.    Please see PT not for further details regarding treatment session.  Therapy Documentation Precautions:  Precautions Precautions: Fall Restrictions Weight Bearing Restrictions: No General:   Vital Signs:   Pain: Pain Assessment Pain Assessment: No/denies pain Pain Score: 0-No pain ADL:    See FIM for current functional status  Therapy/Group: Co-Treatment  Meiling Hendriks 07/01/2014, 12:38 PM

## 2014-07-01 NOTE — Progress Notes (Signed)
Subjective/Complaints: 80 y.o. right-handed male with history diabetes mellitus with peripheral neuropathy, CAD status post CABG, atrial flutter on chronic Coumadin, chronic renal insufficiency with baseline creatinine 3.84, chronic diastolic congestive heart failure. Independent with occasional cane and driving living with family. Admitted 06/11/2049 of left-sided weakness and slurred speech as well as recent fall but denies striking his head. Cranial CT scan shows a right pontomedullary 14 x 22 mm oval focus consistent with acute hemorrhage. INR on admission of 6.65 and systolic blood pressure 993. Placed on intravenous Lopressor and Coumadin was reversed. Followup cranial CT scan unchanged. 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 Tolerating a regular consistency diet. Coumadin remains on hold with cardiac rate control.  Pt comfortable awake in bed, speech remains hypophonic. Denies dizziness,  CP, SOB    Review of Systems - Negative except weak on left  Objective: Vital Signs: Blood pressure 138/54, pulse 56, temperature 98.1 F (36.7 C), temperature source Oral, resp. rate 20, weight 78.472 kg (173 lb), SpO2 98.00%. No results found. Results for orders placed during the hospital encounter of 06/14/14 (from the past 72 hour(s))  GLUCOSE, CAPILLARY     Status: Abnormal   Collection Time    06/28/14 11:28 AM      Result Value Ref Range   Glucose-Capillary 209 (*) 70 - 99 mg/dL   Comment 1 Notify RN    GLUCOSE, CAPILLARY     Status: Abnormal   Collection Time    06/28/14  4:29 PM      Result Value Ref Range   Glucose-Capillary 250 (*) 70 - 99 mg/dL  GLUCOSE, CAPILLARY     Status: Abnormal   Collection Time    06/28/14  9:27 PM      Result Value Ref Range   Glucose-Capillary 109 (*) 70 - 99 mg/dL  BASIC METABOLIC PANEL     Status: Abnormal   Collection Time    06/29/14  5:49 AM      Result Value Ref Range   Sodium 137  137 - 147 mEq/L   Potassium  4.9  3.7 - 5.3 mEq/L   Chloride 101  96 - 112 mEq/L   CO2 22  19 - 32 mEq/L   Glucose, Bld 94  70 - 99 mg/dL   BUN 46 (*) 6 - 23 mg/dL   Creatinine, Ser 3.56 (*) 0.50 - 1.35 mg/dL   Calcium 9.9  8.4 - 10.5 mg/dL   GFR calc non Af Amer 15 (*) >90 mL/min   GFR calc Af Amer 17 (*) >90 mL/min   Comment: (NOTE)     The eGFR has been calculated using the CKD EPI equation.     This calculation has not been validated in all clinical situations.     eGFR's persistently <90 mL/min signify possible Chronic Kidney     Disease.   Anion gap 14  5 - 15  GLUCOSE, CAPILLARY     Status: None   Collection Time    06/29/14  6:47 AM      Result Value Ref Range   Glucose-Capillary 96  70 - 99 mg/dL   Comment 1 Notify RN    GLUCOSE, CAPILLARY     Status: Abnormal   Collection Time    06/29/14 12:35 PM      Result Value Ref Range   Glucose-Capillary 135 (*) 70 - 99 mg/dL  GLUCOSE, CAPILLARY     Status: Abnormal   Collection Time    06/29/14  5:08 PM      Result Value Ref Range   Glucose-Capillary 115 (*) 70 - 99 mg/dL  GLUCOSE, CAPILLARY     Status: Abnormal   Collection Time    06/29/14  9:13 PM      Result Value Ref Range   Glucose-Capillary 133 (*) 70 - 99 mg/dL  BASIC METABOLIC PANEL     Status: Abnormal   Collection Time    06/30/14  4:25 AM      Result Value Ref Range   Sodium 137  137 - 147 mEq/L   Potassium 4.9  3.7 - 5.3 mEq/L   Chloride 101  96 - 112 mEq/L   CO2 23  19 - 32 mEq/L   Glucose, Bld 104 (*) 70 - 99 mg/dL   BUN 50 (*) 6 - 23 mg/dL   Creatinine, Ser 3.56 (*) 0.50 - 1.35 mg/dL   Calcium 9.7  8.4 - 10.5 mg/dL   GFR calc non Af Amer 15 (*) >90 mL/min   GFR calc Af Amer 17 (*) >90 mL/min   Comment: (NOTE)     The eGFR has been calculated using the CKD EPI equation.     This calculation has not been validated in all clinical situations.     eGFR's persistently <90 mL/min signify possible Chronic Kidney     Disease.   Anion gap 13  5 - 15  GLUCOSE, CAPILLARY     Status:  Abnormal   Collection Time    06/30/14  7:33 AM      Result Value Ref Range   Glucose-Capillary 111 (*) 70 - 99 mg/dL  GLUCOSE, CAPILLARY     Status: Abnormal   Collection Time    06/30/14 11:51 AM      Result Value Ref Range   Glucose-Capillary 138 (*) 70 - 99 mg/dL  GLUCOSE, CAPILLARY     Status: Abnormal   Collection Time    06/30/14  4:57 PM      Result Value Ref Range   Glucose-Capillary 126 (*) 70 - 99 mg/dL  GLUCOSE, CAPILLARY     Status: Abnormal   Collection Time    06/30/14  9:47 PM      Result Value Ref Range   Glucose-Capillary 110 (*) 70 - 99 mg/dL  GLUCOSE, CAPILLARY     Status: None   Collection Time    07/01/14  4:15 AM      Result Value Ref Range   Glucose-Capillary 95  70 - 99 mg/dL  BASIC METABOLIC PANEL     Status: Abnormal   Collection Time    07/01/14  5:51 AM      Result Value Ref Range   Sodium 137  137 - 147 mEq/L   Potassium 5.0  3.7 - 5.3 mEq/L   Chloride 102  96 - 112 mEq/L   CO2 21  19 - 32 mEq/L   Glucose, Bld 99  70 - 99 mg/dL   BUN 49 (*) 6 - 23 mg/dL   Creatinine, Ser 3.41 (*) 0.50 - 1.35 mg/dL   Calcium 9.9  8.4 - 10.5 mg/dL   GFR calc non Af Amer 16 (*) >90 mL/min   GFR calc Af Amer 18 (*) >90 mL/min   Comment: (NOTE)     The eGFR has been calculated using the CKD EPI equation.     This calculation has not been validated in all clinical situations.     eGFR's persistently <90 mL/min signify possible Chronic Kidney  Disease.   Anion gap 14  5 - 15     HEENT: normal Cardio: RRR and no murmur Resp: CTA B/L and unlabored GI: BS positive and NT, ND Extremity:  No Edema Skin:   Other Left transerve healed breast incision at nipple level, left upper back incion well healed, dry skin both feet without open areas Neuro: Lethargic, Flat, Cranial Nerve Abnormalities Left central 7, Abnormal Sensory decreased in both feet, Abnormal Motor bilateral hand intrinsic atrophy and Other motor tr Left bi, tri grip, 3- Left HF, KE , 2- ankle  DF Musc/Skel:  Other no pain with AROM in BUE and BLE, or with neck movement, sitting balance fair minus Gen NAD   Assessment/Plan: 1. Functional deficits secondary to RIght pontomedullary hemorrhage with Left hemiparesis superimposed on severe diabetic neuropathy which require 3+ hours per day of interdisciplinary therapy in a comprehensive inpatient rehab setting. Physiatrist is providing close team supervision and 24 hour management of active medical problems listed below. Physiatrist and rehab team continue to assess barriers to discharge/monitor patient progress toward functional and medical goals.   FIM: FIM - Bathing Bathing Steps Patient Completed: Chest;Left Arm;Abdomen;Front perineal area;Right upper leg;Left upper leg Bathing: 1: Total-Patient completes 0-2 of 10 parts or less than 25%  FIM - Upper Body Dressing/Undressing Upper body dressing/undressing steps patient completed: Pull shirt over trunk Upper body dressing/undressing: 0: Wears gown/pajamas-no public clothing FIM - Lower Body Dressing/Undressing Lower body dressing/undressing steps patient completed: Thread/unthread left pants leg Lower body dressing/undressing: 0: Wears Interior and spatial designer  FIM - Toileting Toileting: 1: Two helpers  FIM - Air cabin crew Transfers: 1-From toilet/BSC: Total A (helper does all/Pt. < 25%);2-To toilet/BSC: Max A (lift and lower assist);1-Two helpers (Additional person present for safety)  FIM - Control and instrumentation engineer Devices: Bed rails;Arm rests Bed/Chair Transfer: 1: Mechanical lift;1: Two helpers  FIM - Locomotion: Wheelchair Distance: 25 Locomotion: Wheelchair: 1: Total Assistance/staff pushes wheelchair (Pt<25%) FIM - Locomotion: Ambulation Locomotion: Ambulation Assistive Devices: Ethelene Hal Ambulation/Gait Assistance: 1: +2 Total assist;3: Mod assist Locomotion: Ambulation: 0: Activity did not  occur  Comprehension Comprehension Mode: Auditory Comprehension: 5-Follows basic conversation/direction: With extra time/assistive device  Expression Expression Mode: Verbal Expression: 4-Expresses basic 75 - 89% of the time/requires cueing 10 - 24% of the time. Needs helper to occlude trach/needs to repeat words.  Social Interaction Social Interaction: 3-Interacts appropriately 50 - 74% of the time - May be physically or verbally inappropriate.  Problem Solving Problem Solving: 2-Solves basic 25 - 49% of the time - needs direction more than half the time to initiate, plan or complete simple activities  Memory Memory: 3-Recognizes or recalls 50 - 74% of the time/requires cueing 25 - 49% of the time  Medical Problem List and Plan:  1. 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 Latest cranial CT scan stable  2. DVT Prophylaxis/Anticoagulation: Subcutaneous heparin initiated for DVT prophylaxis 06/13/2014  3. Pain Management: Tylenol as needed. Monitor the increased mobility  4. Diabetes mellitus with peripheral neuropathy. Latest hemoglobin A1c 6.7. Check blood sugars a.c. and at bedtime.Tradjenta 5 mg daily.Just on Trajenta now, Patient on Levemir 15 units each bedtime and Onglyza 2.5 mg daily prior to admission.    -consider resuming levemir if sugars remain elevated 5. Neuropsych: This patient is not capable of making decisions on his own behalf.  6. Skin/Wound Care: Routine skin checks  7. Chronic renal insufficiency. Baseline creatinine 2.84. Followup chemistries show stable GFR ~  20  -Cr up to >3  -need more regular weights--check today  -off IVF, lasix on hold, monitor  -continue to encourage fluids    -recheck bmet tomorrow 8. Chronic diastolic congestive heart failure. Monitor for any signs of fluid overload. Patient on Lasix 80 mg Daily.(held given #7)  9. Hypertension. Norvasc 10 mg daily, Coreg 6.25 mg twice a day.  Monitor with increased mobility  10. CAD with history of CABG. No chest pain or shortness of breath  11. Hyperlipidemia. Lipitor  12. GERD. Protonix 13.  Possible UTI: ucx neg  LOS (Days) 17 A FACE TO FACE EVALUATION WAS PERFORMED   [name] E 07/01/2014, 6:58 AM

## 2014-07-01 NOTE — Progress Notes (Signed)
Physical Therapy Weekly Progress Note  Patient Details  Name: [name] MRN: [id number] Date of Birth: 26-Jun-1931  Beginning of progress report period: June 24, 2014 End of progress report period: July 01, 2014  Today's Date: 07/01/2014 PT Co-Treatment Time: 1035-1105 (Co-tx with OT; entire session from 10:35-1135) PT Co-Treatment Time Calculation (min): 30 min  Patient has met 2 of 5 short term goals.  Pt is demonstrating consistent improvement in independence with functional mobility. Pt met STG's addressing bed mobility. Pt continues to required max-Total A for functional transfers; however, pt is able to consistently perform transfers without assist of 2 persons and requires less cueing for setup and initiation of transfers. Goal addressing sit>stand was partially met, as pt has inconsistently performed transfer with mod A. Pt requires mod A overall for stability with functional ambulation when using EVA walker; however additional person required for safety. Modified STG to reflect gait with Total A of single therapist.  Patient continues to demonstrate the following deficits: muscle weakness, impaired timing and sequencing, unbalanced muscle activation, ataxia, decreased coordination and decreased motor planning, decreased midline orientation, decreased initiation, decreased memory and delayed processing and decreased standing balance, decreased postural control and hemiplegia and therefore will continue to benefit from skilled PT intervention to enhance overall performance with activity tolerance, balance, postural control, ability to compensate for deficits, functional use of  left upper extremity and left lower extremity and coordination.  Patient not progressing toward long term goals.  See goal revision..  Plan of care revisions: The following long term goals were downgraded due to slow pt progress: gait in controlled/home environments, car/furniture transfers.  PT Short Term  Goals Week 1:  PT Short Term Goal 1 (Week 1): Pt will demonstrate rolling L in bed req min A PT Short Term Goal 1 - Progress (Week 1): Met PT Short Term Goal 2 (Week 1): Pt will demonstrate supine to sit req mod A PT Short Term Goal 2 - Progress (Week 1): Met PT Short Term Goal 3 (Week 1): Pt will demonstrate stand-pivot transfer bed to w/c req mod A.  PT Short Term Goal 3 - Progress (Week 1): Discontinued (comment) (Focusing on squat pivot transfers) PT Short Term Goal 4 (Week 1): Pt will self propel manual w/c x 50' req min A PT Short Term Goal 4 - Progress (Week 1): Revised due to lack of progress PT Short Term Goal 5 (Week 1): Pt will tolerate ambulation with 2 person assist.  PT Short Term Goal 5 - Progress (Week 1): Met Week 2:  PT Short Term Goal 1 (Week 2): Pt will perform supine>sit with supervision with HOB flat using bed rail. PT Short Term Goal 1 - Progress (Week 2): Met PT Short Term Goal 2 (Week 2): Pt will perform sit>supine with mod A with HOB flat. PT Short Term Goal 2 - Progress (Week 2): Met PT Short Term Goal 3 (Week 2): Pt will transfer from bed<>w/c with min A and 25% cueing for technique. PT Short Term Goal 3 - Progress (Week 2): Progressing toward goal PT Short Term Goal 4 (Week 2): Pt will perform sit>stand with mod A and 50% cueing for technique. PT Short Term Goal 4 - Progress (Week 2): Partly met PT Short Term Goal 5 (Week 2): Pt will perform gait x25' with max A of single therapist. PT Short Term Goal 5 - Progress (Week 2): Not met Week 3:  PT Short Term Goal 1 (Week 3): Pt will consistently perform sit>supine  with min A with HOB flat with 25% cueing for technique. PT Short Term Goal 2 (Week 3): Pt will consistently transfer from bed<>w/c to R and L sides with mod A and 50% cueing for technique. PT Short Term Goal 3 (Week 3): Pt will consistently perform sit<>stand with mod A and 50% cueing for technique. PT Short Term Goal 4 (Week 3): Pt will perform gait x10' in  controlled environment with Total A of single therapist. PT Short Term Goal 5 (Week 3): Pt will perform w/c mobility x100' in controlled environment with min A and 25% cueing.  Skilled Therapeutic Interventions/Progress Updates:    Skilled co-tx with OT focusing on increasing pt independence with functional transfers. Pt received seated in w/c; agreeable to therapy. Despite discussing rationale for timed toileting during past several PT sessions, pt declining needing to use the bathroom and unable to recall rationale for timed toileting protocol. However, pt agreeable to attempting to use bathroom after education was again provided. Pt performed lateral scooting transfer from w/c>toilet with max A (to L side) without use UE's; from toilet>w/c (to R side) with Total A with RUE support on grab bar; additional person present for safety. Performed sit>stand from toilet with mod A and stand>sit with max A without UE support. Static standing x60-70 seconds during hygiene with max-total A, multimodal cueing for upright posture, bilat hip/knee extension. See OT note for further detail on LB dressing and toileting.  Performed lateral scooting transfers from bed<>w/c with max A in bilat directions, improved initiation of anterior weight shift but mod cueing required for full weight shift and to initiate active lifting of buttocks. When transferring to R side, tactile cueing provided at L knee for increased LLE weightbearing. Manual stabilization to maintain R foot position on ground. For posterior scooting in seated, tactile cueing at L knee was effective in promoting posterior movement of buttocks in w/c seat.  Performed supine>sit with supervision and min cueing, HOB flat using bed rail. Pt required verbal/demonstration cueing and assist with setup to perform sit>supine managing LLE using RLE with mod A. Session ended in pt room, where pt was left seated in w/c with quick release belt in place for safety, family  member present, and all needs within reach.   Therapy Documentation Precautions:  Precautions Precautions: Fall Restrictions Weight Bearing Restrictions: No Pain: Pain Assessment Pain Assessment: No/denies pain  See FIM for current functional status  Therapy/Group: Co-Treatment  Hobble, Blair A 07/01/2014, 1:28 PM

## 2014-07-01 NOTE — Progress Notes (Signed)
Nursing Note: BP 137/50 p-52 apical.A; Paged on-call and made aware of BP and P and that coreg is due.Obtained an order to hold med. Med was held.wbb

## 2014-07-01 NOTE — Progress Notes (Signed)
Occupational Therapy Session Note  Patient Details  Name: [name] MRN: [id number] Date of Birth: 04-Nov-1930  Today's Date: 07/01/2014 OT Individual Time: 2703-5009 OT Individual Time Calculation (min): 60 min    Short Term Goals: Week 2:  OT Short Term Goal 1 (Week 2): Pt will perform toilet transfer with max A OT Short Term Goal 2 (Week 2): Pt will engage in 10 minutes of functional activity before requiring rest break in order to increase endurance. OT Short Term Goal 3 (Week 2): Pt maintain dynamic sitting balance with supervision for 5 minutes during activity. OT Short Term Goal 4 (Week 2): Pt will perform LB dressing with Max A.  OT Short Term Goal 5 (Week 2): Pt will complete UB dressing with Min A.  Skilled Therapeutic Interventions/Progress Updates:   Pt engaged in bed mobility while rolling side-to-side to in order wash perineal area and be assisted with washing buttocks, supine > sit EOB with Min A.  Scoot/squat pivot transfer from EOB > w/c at Total A level, verbal cuing provided to push through BLEs to assist with weight shifting as needed for sit <> stand or transfers.  Completed bathing tasks at w/c level in front of sink, providing hand-over-hand technique utilizing LUE in order to wash Rt side.  Discussed dressing goal with Pt and stressed the importance of engaging in dressing tasks utilizing own clothes verses hospital gown, Pt reported he would like to try normal clothing again when he gets his dirty clothes washed and are available.       Therapy Documentation Precautions:  Precautions Precautions: Fall Restrictions Weight Bearing Restrictions: No General:   Vital Signs:   Pain: No c/o pain. Pain Assessment Pain Assessment: No/denies pain Pain Score: 0-No pain   Other Treatments:    See FIM for current functional status  Therapy/Group: Individual Therapy  Klint Lezcano 07/01/2014, 12:17 PM

## 2014-07-01 NOTE — Progress Notes (Signed)
Speech Language Pathology Weekly Progress and Session Note  Patient Details  Name: [name] MRN: [id number] Date of Birth: 11/02/1930  Beginning of progress report period: June 24, 2014 End of progress report period: July 01, 2014  Today's Date: 07/01/2014 SLP Individual Time: 0900-1000 SLP Individual Time Calculation (min): 60 min  Short Term Goals: Week 2: SLP Short Term Goal 1 (Week 2): Patient will utilize speech strategies (slow down, over-articulate, pause between words) to achieve >80% intelligibility at phrase level, with minimal cues. SLP Short Term Goal 1 - Progress (Week 2): Met SLP Short Term Goal 2 (Week 2): Patient will demonstrate intellectual awareness by identifying at least 1 speech and 1 physical deficit, with moderate cues. SLP Short Term Goal 2 - Progress (Week 2): Met SLP Short Term Goal 3 (Week 2): Patient will recall and demonstrate understanding of safety precautions and strategies learned in PT, OT, SLP treatment sessions, with minimal cues. SLP Short Term Goal 3 - Progress (Week 2): Revised due to lack of progress SLP Short Term Goal 4 (Week 2): Patient will solve basic level functional problems wtih 80% accuracy with min assist. SLP Short Term Goal 4 - Progress (Week 2): Met SLP Short Term Goal 5 (Week 2): Pt will utilize his recommended swallowing precautions with mod I to minimize overt s/s of aspiration with presentations of his currently prescribed diet.  SLP Short Term Goal 5 - Progress (Week 2): Met    New Short Term Goals: Week 3: SLP Short Term Goal 1 (Week 3): Patient will utilize speech strategies (slow down, over-articulate, pause between words) to achieve >80% intelligibility at phrase level, with supervision cues. SLP Short Term Goal 2 (Week 3): Patient will demonstrate intellectual awareness by identifying at least 1 speech and 1 physical deficit, with min assist. SLP Short Term Goal 3 (Week 3): Pt will use environmental aids to  facilitate improved recall of daily information for 75% accuracy wtih mod assist  SLP Short Term Goal 4 (Week 3): Patient will solve basic level functional problems wtih 80% accuracy with supervision. SLP Short Term Goal 5 (Week 3): Pt will return demonstration of oral motor exercises to improve left sided weakness.    Weekly Progress Updates:  Pt made functional gains and has met 4 out of 5 short term goals this reporting period due to improved use of swallowing precautions, improved intellectual awareness, improved basic problem solving, and improved use of dysarthria strategies to improve intelligibility at the phrase level.  Currently, pt requires overall min-mod assist for cognitive-linguistic tasks and is utilizing his recommended swallowing precautions with mod I to minimize overt s/s of aspiration with regular solids and thin liquids.  No family has been present for training/education this reporting period, pt education is ongoing.  SLP recommends family training prior to d/c as pt will need 24/7 supervision at discharge due to overt cognitive impairments.  Pt would continue to benefit from skilled ST while inpatient in order to maximize functional independence and reduce burden of care upon discharge.     Intensity: Minumum of 1-2 x/day, 30 to 90 minutes Frequency: 5 out of 7 days Duration/Length of Stay: 10-14 days Treatment/Interventions: Cognitive remediation/compensation;Speech/Language facilitation;Cueing hierarchy;Functional tasks;Internal/external aids;Patient/family education;Therapeutic Exercise   Daily Session  Skilled Therapeutic Interventions: Pt was seen for skilled ST targeting cognitive goals.  Upon arrival, pt was upright in wheelchair, finishing breakfast with no overt s/s of aspiration noted with solid or liquid consistencies. Pt required supervision/set up for initiation and sequencing of basic  oral care tasks following meal.  SLP facilitated the session with a structured  new learning activity targeting basic problem solving with pt requiring overall min assist to complete task.  SLP also facilitated the session with a structured picture description task targeting use of loud voice to improve speech intelligibility at the phrase/sentence level.  Pt returned demonstration of strategy with min instructional cues resulting in 90% speech intelligibility in context.  SLP recommended that pt continue to use his voice versus gesturing or pointing to indicate needs/wants.  Pt was able to recall recommendation to use voice from Friday's therapy session using the functional phrase "If you don't use it, you lose it" and with min instructional cues.       FIM:  Comprehension Comprehension Mode: Auditory Comprehension: 5-Follows basic conversation/direction: With extra time/assistive device Expression Expression Mode: Verbal Expression: 4-Expresses basic 75 - 89% of the time/requires cueing 10 - 24% of the time. Needs helper to occlude trach/needs to repeat words. Social Interaction Social Interaction: 4-Interacts appropriately 75 - 89% of the time - Needs redirection for appropriate language or to initiate interaction. Problem Solving Problem Solving: 3-Solves basic 50 - 74% of the time/requires cueing 25 - 49% of the time Memory Memory: 3-Recognizes or recalls 50 - 74% of the time/requires cueing 25 - 49% of the time  Pain Pain Assessment Pain Assessment: No/denies pain  Therapy/Group: Individual Therapy  [name], M.A. CCC-SLP  Nevaeh Casillas, Selinda Orion 07/01/2014, 10:54 AM

## 2014-07-02 ENCOUNTER — Inpatient Hospital Stay (HOSPITAL_COMMUNITY): Payer: Medicare HMO | Admitting: Occupational Therapy

## 2014-07-02 ENCOUNTER — Inpatient Hospital Stay (HOSPITAL_COMMUNITY): Payer: Medicare Other | Admitting: Speech Pathology

## 2014-07-02 DIAGNOSIS — G8110 Spastic hemiplegia affecting unspecified side: Secondary | ICD-10-CM | POA: Diagnosis present

## 2014-07-02 LAB — BASIC METABOLIC PANEL
Anion gap: 15 (ref 5–15)
BUN: 49 mg/dL — ABNORMAL HIGH (ref 6–23)
CO2: 21 mEq/L (ref 19–32)
Calcium: 10 mg/dL (ref 8.4–10.5)
Chloride: 101 mEq/L (ref 96–112)
Creatinine, Ser: 3.28 mg/dL — ABNORMAL HIGH (ref 0.50–1.35)
GFR, EST AFRICAN AMERICAN: 19 mL/min — AB (ref >90)
GFR, EST NON AFRICAN AMERICAN: 16 mL/min — AB (ref >90)
GLUCOSE: 103 mg/dL — AB (ref 70–99)
POTASSIUM: 5.3 meq/L (ref 3.7–5.3)
Sodium: 137 mEq/L (ref 137–147)

## 2014-07-02 LAB — GLUCOSE, CAPILLARY
GLUCOSE-CAPILLARY: 105 mg/dL — AB (ref 70–99)
Glucose-Capillary: 147 mg/dL — ABNORMAL HIGH (ref 70–99)
Glucose-Capillary: 191 mg/dL — ABNORMAL HIGH (ref 70–99)
Glucose-Capillary: 149 mg/dL — ABNORMAL HIGH (ref 70–99)

## 2014-07-02 NOTE — Plan of Care (Signed)
Problem: RH BLADDER ELIMINATION Goal: RH STG MANAGE BLADDER WITH ASSISTANCE STG Manage Bladder With mod Assistance  Outcome: Not Progressing Uses condom cath at night; attempting timed toileting with min results

## 2014-07-02 NOTE — Progress Notes (Signed)
Occupational Therapy Session Note  Patient Details  Name: [name] MRN: [id number] Date of Birth: 07/28/1931  Today's Date: 07/02/2014 OT Co-Treatment Time: 1330-1400 (co-tx with PT full time 1300-1400) OT Co-Treatment Time Calculation (min): 30 min   Short Term Goals: Week 2:  OT Short Term Goal 1 (Week 2): Pt will perform toilet transfer with max A OT Short Term Goal 2 (Week 2): Pt will engage in 10 minutes of functional activity before requiring rest break in order to increase endurance. OT Short Term Goal 3 (Week 2): Pt maintain dynamic sitting balance with supervision for 5 minutes during activity. OT Short Term Goal 4 (Week 2): Pt will perform LB dressing with Max A.  OT Short Term Goal 5 (Week 2): Pt will complete UB dressing with Min A.  Skilled Therapeutic Interventions/Progress Updates:    Engaged in skilled co-treat with PT with focus on transfer training to increase pt participation in functional transfers.  Engaged in bed > w/c transfer with focus on forward weight shift, requiring over the back technique to facilitate forward weight shifting.  Focus on increasing pt participation in transfers and consistency to incorporate transfers across the care team and begin more consistency with toilet transfers to improve toileting results.  Attempted stand pivot transfer with reaching for this therapist hand in standing while 2nd therapist provided manual facilitation at trunk/hips for weight shift, which was unsuccessful in decreasing burden.  Utilized reaching with RUE towards floor to pick up item and tactile cues at buttocks to demonstrate degree of forward weight shift needed to increase squat pivot transfer.  After forward weight shifting activity, utilized therapist in chair in front of pt with pt's hands on either arm rest of chair to facilitate anterior weight shift and this therapist's hands on posterior hips as tactile cue for squat pivot transfer with pt able to "push  through legs" and complete squat pivot with moderate assist (however +2 due to tactile cues at hips and trunk from 2 therapists).    Therapy Documentation Precautions:  Precautions Precautions: Fall Restrictions Weight Bearing Restrictions: No General:   Vital Signs: Therapy Vitals Temp: 98.7 F (37.1 C) Temp Source: Oral Pulse Rate: 62 Resp: 18 BP: 141/57 mmHg Patient Position (if appropriate): Sitting Oxygen Therapy SpO2: 100 % O2 Device: None (Room air) Pain:  Pt with no c/o pain  See FIM for current functional status  Therapy/Group: Co-Treatment  Garima Chronis 07/02/2014, 2:52 PM

## 2014-07-02 NOTE — Progress Notes (Signed)
I have reviewed and agree with the attached treatment note.  Damar Petit, OTR/L 

## 2014-07-02 NOTE — Progress Notes (Signed)
Speech Language Pathology Daily Session Note  Patient Details  Name: [name] MRN: [id number] Date of Birth: 04-18-1931  Today's Date: 07/02/2014 SLP Individual Time: 0900-1000 SLP Individual Time Calculation (min): 60 min  Short Term Goals: Week 3: SLP Short Term Goal 1 (Week 3): Patient will utilize speech strategies (slow down, over-articulate, pause between words) to achieve >80% intelligibility at phrase level, with supervision cues. SLP Short Term Goal 2 (Week 3): Patient will demonstrate intellectual awareness by identifying at least 1 speech and 1 physical deficit, with min assist. SLP Short Term Goal 3 (Week 3): Pt will use environmental aids to facilitate improved recall of daily information for 75% accuracy wtih mod assist  SLP Short Term Goal 4 (Week 3): Patient will solve basic level functional problems wtih 80% accuracy with supervision. SLP Short Term Goal 5 (Week 3): Pt will return demonstration of oral motor exercises to improve left sided weakness.    Skilled Therapeutic Interventions:  Pt was seen for skilled speech therapy targeting cognitive-linguistic goals.  Pt was completing his meal upon arrival and was observed to utilize his swallowing precautions with mod I to minimize overt s/s of aspiration with regular solids and thin liquids.  SLP positioned pt to be seated at edge of bed to maximize alertness and improve breath support for speech.  SLP facilitated the session with a structured new learning activity targeting use of external aids for working memory and compensatory strategies for dysarthria.  Pt recalled use of loud voice as an intelligibility strategy from previous therapy sessions with min question cues.  Pt utilized dysarthria strategies during the abovementioned task with min assist.  Pt also benefited from mod cues for working memory during task.  Continue per current plan of care.    FIM:  Comprehension Comprehension Mode: Auditory Comprehension:  5-Understands basic 90% of the time/requires cueing < 10% of the time Expression Expression Mode: Verbal Expression: 4-Expresses basic 75 - 89% of the time/requires cueing 10 - 24% of the time. Needs helper to occlude trach/needs to repeat words. Social Interaction Social Interaction: 4-Interacts appropriately 75 - 89% of the time - Needs redirection for appropriate language or to initiate interaction. Problem Solving Problem Solving: 3-Solves basic 50 - 74% of the time/requires cueing 25 - 49% of the time Memory Memory: 2-Recognizes or recalls 25 - 49% of the time/requires cueing 51 - 75% of the time FIM - Eating Eating Activity: 6: More than reasonable amount of time  Pain Pain Assessment Pain Assessment: No/denies pain  Therapy/Group: Individual Therapy  [name], [name] 07/02/2014, 3:28 PM

## 2014-07-02 NOTE — Progress Notes (Signed)
Subjective/Complaints: 80 y.o. right-handed male with history diabetes mellitus with peripheral neuropathy, CAD status post CABG, atrial flutter on chronic Coumadin, chronic renal insufficiency with baseline creatinine 4.50, chronic diastolic congestive heart failure. Independent with occasional cane and driving living with family. Admitted 06/11/2049 of left-sided weakness and slurred speech as well as recent fall but denies striking his head. Cranial CT scan shows a right pontomedullary 14 x 22 mm oval focus consistent with acute hemorrhage. INR on admission of 3.88 and systolic blood pressure 828. Placed on intravenous Lopressor and Coumadin was reversed. Followup cranial CT scan unchanged. 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 Tolerating a regular consistency diet. Coumadin remains on hold with cardiac rate control.  Sleeping but awakens easily deines prob with Bowel or bladder  But has condom cath on  Review of Systems - Negative except weak on left  Objective: Vital Signs: Blood pressure 137/58, pulse 55, temperature 98.4 F (36.9 C), temperature source Oral, resp. rate 18, weight 78.472 kg (173 lb), SpO2 100.00%. No results found. Results for orders placed during the hospital encounter of 06/14/14 (from the past 72 hour(s))  GLUCOSE, CAPILLARY     Status: Abnormal   Collection Time    06/29/14 12:35 PM      Result Value Ref Range   Glucose-Capillary 135 (*) 70 - 99 mg/dL  GLUCOSE, CAPILLARY     Status: Abnormal   Collection Time    06/29/14  5:08 PM      Result Value Ref Range   Glucose-Capillary 115 (*) 70 - 99 mg/dL  GLUCOSE, CAPILLARY     Status: Abnormal   Collection Time    06/29/14  9:13 PM      Result Value Ref Range   Glucose-Capillary 133 (*) 70 - 99 mg/dL  BASIC METABOLIC PANEL     Status: Abnormal   Collection Time    06/30/14  4:25 AM      Result Value Ref Range   Sodium 137  137 - 147 mEq/L   Potassium 4.9  3.7 - 5.3 mEq/L   Chloride 101  96 - 112 mEq/L   CO2 23  19 - 32 mEq/L   Glucose, Bld 104 (*) 70 - 99 mg/dL   BUN 50 (*) 6 - 23 mg/dL   Creatinine, Ser 3.56 (*) 0.50 - 1.35 mg/dL   Calcium 9.7  8.4 - 10.5 mg/dL   GFR calc non Af Amer 15 (*) >90 mL/min   GFR calc Af Amer 17 (*) >90 mL/min   Comment: (NOTE)     The eGFR has been calculated using the CKD EPI equation.     This calculation has not been validated in all clinical situations.     eGFR's persistently <90 mL/min signify possible Chronic Kidney     Disease.   Anion gap 13  5 - 15  GLUCOSE, CAPILLARY     Status: Abnormal   Collection Time    06/30/14  7:33 AM      Result Value Ref Range   Glucose-Capillary 111 (*) 70 - 99 mg/dL  GLUCOSE, CAPILLARY     Status: Abnormal   Collection Time    06/30/14 11:51 AM      Result Value Ref Range   Glucose-Capillary 138 (*) 70 - 99 mg/dL  GLUCOSE, CAPILLARY     Status: Abnormal   Collection Time    06/30/14  4:57 PM      Result Value Ref Range   Glucose-Capillary 126 (*)  70 - 99 mg/dL  GLUCOSE, CAPILLARY     Status: Abnormal   Collection Time    06/30/14  9:47 PM      Result Value Ref Range   Glucose-Capillary 110 (*) 70 - 99 mg/dL  GLUCOSE, CAPILLARY     Status: None   Collection Time    07/01/14  4:15 AM      Result Value Ref Range   Glucose-Capillary 95  70 - 99 mg/dL  BASIC METABOLIC PANEL     Status: Abnormal   Collection Time    07/01/14  5:51 AM      Result Value Ref Range   Sodium 137  137 - 147 mEq/L   Potassium 5.0  3.7 - 5.3 mEq/L   Chloride 102  96 - 112 mEq/L   CO2 21  19 - 32 mEq/L   Glucose, Bld 99  70 - 99 mg/dL   BUN 49 (*) 6 - 23 mg/dL   Creatinine, Ser 3.41 (*) 0.50 - 1.35 mg/dL   Calcium 9.9  8.4 - 10.5 mg/dL   GFR calc non Af Amer 16 (*) >90 mL/min   GFR calc Af Amer 18 (*) >90 mL/min   Comment: (NOTE)     The eGFR has been calculated using the CKD EPI equation.     This calculation has not been validated in all clinical situations.     eGFR's persistently <90  mL/min signify possible Chronic Kidney     Disease.   Anion gap 14  5 - 15  GLUCOSE, CAPILLARY     Status: None   Collection Time    07/01/14  7:12 AM      Result Value Ref Range   Glucose-Capillary 91  70 - 99 mg/dL  GLUCOSE, CAPILLARY     Status: Abnormal   Collection Time    07/01/14 11:37 AM      Result Value Ref Range   Glucose-Capillary 138 (*) 70 - 99 mg/dL   Comment 1 Notify RN    GLUCOSE, CAPILLARY     Status: Abnormal   Collection Time    07/01/14  4:32 PM      Result Value Ref Range   Glucose-Capillary 220 (*) 70 - 99 mg/dL   Comment 1 Notify RN    GLUCOSE, CAPILLARY     Status: Abnormal   Collection Time    07/01/14  9:17 PM      Result Value Ref Range   Glucose-Capillary 125 (*) 70 - 99 mg/dL  BASIC METABOLIC PANEL     Status: Abnormal   Collection Time    07/02/14  5:57 AM      Result Value Ref Range   Sodium 137  137 - 147 mEq/L   Potassium 5.3  3.7 - 5.3 mEq/L   Chloride 101  96 - 112 mEq/L   CO2 21  19 - 32 mEq/L   Glucose, Bld 103 (*) 70 - 99 mg/dL   BUN 49 (*) 6 - 23 mg/dL   Creatinine, Ser 3.28 (*) 0.50 - 1.35 mg/dL   Calcium 10.0  8.4 - 10.5 mg/dL   GFR calc non Af Amer 16 (*) >90 mL/min   GFR calc Af Amer 19 (*) >90 mL/min   Comment: (NOTE)     The eGFR has been calculated using the CKD EPI equation.     This calculation has not been validated in all clinical situations.     eGFR's persistently <90 mL/min signify possible Chronic Kidney  Disease.   Anion gap 15  5 - 15  GLUCOSE, CAPILLARY     Status: Abnormal   Collection Time    07/02/14  7:05 AM      Result Value Ref Range   Glucose-Capillary 105 (*) 70 - 99 mg/dL     HEENT: normal Cardio: RRR and no murmur Resp: CTA B/L and unlabored GI: BS positive and NT, ND Extremity:  No Edema Skin:   Other Left transerve healed breast incision at nipple level, left upper back incion well healed, dry skin both feet without open areas Neuro: Lethargic, Flat, Cranial Nerve Abnormalities Left  central 7, Abnormal Sensory decreased in both feet, Abnormal Motor bilateral hand intrinsic atrophy and Other motor tr Left bi, tri grip, 3- Left HF, KE , 2- ankle DF Musc/Skel:  Other no pain with AROM in BUE and BLE, or with neck movement, sitting balance fair minus Gen NAD   Assessment/Plan: 1. Functional deficits secondary to RIght pontomedullary hemorrhage with Left hemiparesis superimposed on severe diabetic neuropathy which require 3+ hours per day of interdisciplinary therapy in a comprehensive inpatient rehab setting. Physiatrist is providing close team supervision and 24 hour management of active medical problems listed below. Physiatrist and rehab team continue to assess barriers to discharge/monitor patient progress toward functional and medical goals.   FIM: FIM - Bathing Bathing Steps Patient Completed: Chest;Left Arm;Abdomen;Front perineal area;Right upper leg;Left upper leg Bathing: 3: Mod-Patient completes 5-7 23f 10 parts or 50-74%  FIM - Upper Body Dressing/Undressing Upper body dressing/undressing steps patient completed: Pull shirt over trunk Upper body dressing/undressing: 0: Wears gown/pajamas-no public clothing FIM - Lower Body Dressing/Undressing Lower body dressing/undressing steps patient completed: Thread/unthread left pants leg Lower body dressing/undressing: 0: Wears Interior and spatial designer  FIM - Musician Devices: Grab bar or rail for support Toileting: 1: Two helpers  FIM - Radio producer Devices: Grab bars Toilet Transfers: 1-From toilet/BSC: Total A (helper does all/Pt. < 25%);2-To toilet/BSC: Max A (lift and lower assist);1-Two helpers  FIM - Control and instrumentation engineer Devices: Arm rests;Bed rails Bed/Chair Transfer: 5: Supine > Sit: Supervision (verbal cues/safety issues);3: Sit > Supine: Mod A (lifting assist/Pt. 50-74%/lift 2 legs);2: Chair or W/C > Bed: Max A (lift and  lower assist);2: Bed > Chair or W/C: Max A (lift and lower assist)  FIM - Locomotion: Wheelchair Distance: 25 Locomotion: Wheelchair: 0: Activity did not occur FIM - Locomotion: Ambulation Locomotion: Ambulation Assistive Devices: Nutritional therapist Ambulation/Gait Assistance: 1: +2 Total assist;3: Mod assist Locomotion: Ambulation: 0: Activity did not occur  Comprehension Comprehension Mode: Auditory Comprehension: 5-Follows basic conversation/direction: With extra time/assistive device  Expression Expression Mode: Verbal Expression: 4-Expresses basic 75 - 89% of the time/requires cueing 10 - 24% of the time. Needs helper to occlude trach/needs to repeat words.  Social Interaction Social Interaction: 4-Interacts appropriately 75 - 89% of the time - Needs redirection for appropriate language or to initiate interaction.  Problem Solving Problem Solving: 3-Solves basic 50 - 74% of the time/requires cueing 25 - 49% of the time  Memory Memory: 3-Recognizes or recalls 50 - 74% of the time/requires cueing 25 - 49% of the time  Medical Problem List and Plan:  1. 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 Latest cranial CT scan stable  2. DVT Prophylaxis/Anticoagulation: Subcutaneous heparin initiated for DVT prophylaxis 06/13/2014  3. Pain Management: Tylenol as needed. Monitor the increased mobility  4. Diabetes mellitus with peripheral neuropathy. Latest  hemoglobin A1c 6.7. Check blood sugars a.c. and at bedtime.Tradjenta 5 mg daily.Just on Trajenta now, Patient on Levemir 15 units each bedtime and Onglyza 2.5 mg daily prior to admission.    -consider resuming levemir if sugars remain elevated 5. Neuropsych: This patient is not capable of making decisions on his own behalf.  6. Skin/Wound Care: Routine skin checks  7. Chronic renal insufficiency. Baseline creatinine 2.84. Followup chemistries show stable GFR ~20  -Cr up to >3  -monitor  wts  -off IVF, lasix on hold, monitor  -continue to encourage fluids    -recheck bmet tomorrow 8. Chronic diastolic congestive heart failure. Monitor for any signs of fluid overload. Patient on Lasix 80 mg Daily.(held given #7)  9. Hypertension. Norvasc 10 mg daily, Coreg 6.25 mg twice a day. Monitor with increased mobility  10. CAD with history of CABG. No chest pain or shortness of breath  11. Hyperlipidemia. Lipitor  12. GERD. Protonix 13.  Possible UTI: ucx neg  LOS (Days) 18 A FACE TO FACE EVALUATION WAS PERFORMED   KIRSTEINS,ANDREW E 07/02/2014, 7:31 AM

## 2014-07-02 NOTE — Progress Notes (Signed)
Physical Therapy Session Note  Patient Details  Name: [name] MRN: [id number] Date of Birth: [birth year]  Today's Date: 07/02/2014 PT Co-Treatment Time: 1300-1330 (Co-tx with OT; entire session from 1300-1400) PT Co-Treatment Time Calculation (min): 30 min  Short Term Goals: Week 3:  PT Short Term Goal 1 (Week 3): Pt will consistently perform sit>supine with min A with HOB flat with 25% cueing for technique. PT Short Term Goal 2 (Week 3): Pt will consistently transfer from bed<>w/c to R and L sides with mod A and 50% cueing for technique. PT Short Term Goal 3 (Week 3): Pt will consistently perform sit<>stand with mod A and 50% cueing for technique. PT Short Term Goal 4 (Week 3): Pt will perform gait x10' in controlled environment with Total A of single therapist. PT Short Term Goal 5 (Week 3): Pt will perform w/c mobility x100' in controlled environment with min A and 25% cueing.  Skilled Therapeutic Interventions/Progress Updates:    Skilled co-treatment with primary OT focusing on increasing pt independence with functional transfers with emphasis on increased consistency between staff members. Performed lateral scooting transfers from bed>w/c>mat table with max-total A (hand over back technique) verbal cueing and visual target to initiate anterior weight shift, tactile cueing at bilat ribcage to facilitate active lifting. Seated EOM, pt performed RUE reaching to pick up object from ground to illustrate full anterior weight shift; tactile cueing at bilat ischial tuberosities and mirror positioned anterior to pt for visual feedback of full anterior weight shift. Transitioned to lateral scooting transfer from mat table > w/c with bilat UE support at chair positioned anterior to pt to facilitate midline posture, decreasing pushing to L, and promote full anterior weight shift. Pt performed >75% of transfer but required +2A for safety, tactile cueing at ribcage/pelvis to promote active lifting of  buttocks; tactile cueing at R knee for weightbearing, stabilization of RLE position to prevent pushing; manual stabilization of L hand on chair. Session ended in pt room, where pt was left seated in w/c with quick release belt in place for safety and all needs within reach.  Therapy Documentation Precautions:  Precautions Precautions: Fall Restrictions Weight Bearing Restrictions: No Vital Signs: Therapy Vitals Temp: 98.7 F (37.1 C) Temp Source: Oral Pulse Rate: 62 Resp: 18 BP: 141/57 mmHg Patient Position (if appropriate): Sitting Oxygen Therapy SpO2: 100 % O2 Device: None (Room air) Pain: Pain Assessment Pain Assessment: No/denies pain  See FIM for current functional status  Therapy/Group: Co-Treatment  Chevie Birkhead A 07/02/2014, 5:51 PM

## 2014-07-02 NOTE — Progress Notes (Signed)
Occupational Therapy Weekly Progress Note  Patient Details  Name: [name] MRN: [id number] Date of Birth: 03-Dec-1930  Beginning of progress report period: June 24, 2014 End of progress report period: July 02, 2014  Today's Date: 07/02/2014 OT Individual Time: 1100-1205 OT Individual Time Calculation (min): 65 min    Patient has met 2 of 5 short term goals.  Patient continues to make steady progress towards goals addressing ADL self-care tasks.  However, upon attempting UB and LB dressing tasks, Pt declines to wear specific personal clothing that is clean and available, insisting on wearing hospital gown at all times, discussed with Pt's Grandson when visiting on 07/01/14 Pt's need for more clean clothing in order to address dressing goal.  Pt requires total A, +2 at times, in order to complete toileting transfers and toileting task (adjusting clothing prior to and after task, and wiping buttocks).  OT sessions continue to focus on incorporating LUE into tasks, functional transfers, increasing standing balance as needed for self-care tasks, increasing activity endurance, bed mobility, ADL self-care tasks, and following verbal demands and cues throughout sessions.     Patient continues to demonstrate the following deficits: Lt hemiplegia, decreased cardiorespiratory endurance, apraxia, ataxia, decreased coordination, impaired awareness, problem solving, motor planning, and decreased safety awareness and therefore will continue to benefit from skilled OT intervention to enhance overall performance with BADL and Reduce care partner burden.  Patient progressing toward long term goals..  Continue plan of care.  OT Short Term Goals Week 2:  OT Short Term Goal 1 (Week 2): Pt will perform toilet transfer with max A OT Short Term Goal 1 - Progress (Week 2): Progressing toward goal OT Short Term Goal 2 (Week 2): Pt will engage in 10 minutes of functional activity before requiring rest break  in order to increase endurance. OT Short Term Goal 2 - Progress (Week 2): Met OT Short Term Goal 3 (Week 2): Pt maintain dynamic sitting balance with supervision for 5 minutes during activity. OT Short Term Goal 3 - Progress (Week 2): Met OT Short Term Goal 4 (Week 2): Pt will perform LB dressing with Max A.  OT Short Term Goal 4 - Progress (Week 2): Not met (Resists available clothing) OT Short Term Goal 5 (Week 2):  (Resists available clothing) OT Short Term Goal 5 - Progress (Week 2): Not met Week 3:  OT Short Term Goal 1 (Week 3): Pt will perform LB dressing with Max A OT Short Term Goal 2 (Week 3): Pt will perform UB dressing with Min A OT Short Term Goal 3 (Week 3): Pt will perform BSC toilet transfers at [place] Term Goal 4 (Week 3): Pt will complete 1 of 3 toileting tasks  Skilled Therapeutic Interventions/Progress Updates:   Initially engaged in bed mobility with Pt rolling side-to-side in order to wash buttocks and perineal areas and to have brief changed prior to participating in remaining bathing and dressing tasks at w/c level in front of sink, however, Pt stated he would like to "try to use the toilet."  Focused on functional transfers utilizing scoot/squat pivot from EOB <> drop arm BSC in order to engage in toileting task, verbal and tactile cues required throughout transfer to promote anterior weight shift.  Engaged in bathing task while sitting on BSC, utilizing hand-over-hand technique to wash RUE, apply deodorant, and assist with opening toiletry items.  Engaged in sit > partial stand for hygiene with second person providing hygiene and pulling up brief.  Pt unsuccessful with toileting task during treatment session, transferring back to bed and into supine position at end of session.       Therapy Documentation Precautions:  Precautions Precautions: Fall Restrictions Weight Bearing Restrictions: No General:   Vital Signs: Therapy Vitals Temp: 98.7 F (37.1  C) Temp Source: Oral Pulse Rate: 62 Resp: 18 BP: 141/57 mmHg Patient Position (if appropriate): Sitting Oxygen Therapy SpO2: 100 % O2 Device: None (Room air)  Pain:  No c/o of pain.  ADL:    See FIM for current functional status  Therapy/Group: Individual Therapy  Lamm,Jillana 07/02/2014, 2:43 PM

## 2014-07-02 NOTE — Progress Notes (Signed)
I have reviewed and agree with the attached treatment note.  Ayanni Tun, OTR/L 

## 2014-07-03 ENCOUNTER — Inpatient Hospital Stay (HOSPITAL_COMMUNITY): Payer: Medicare HMO | Admitting: Physical Therapy

## 2014-07-03 ENCOUNTER — Inpatient Hospital Stay (HOSPITAL_COMMUNITY): Payer: Medicare HMO | Admitting: Occupational Therapy

## 2014-07-03 ENCOUNTER — Inpatient Hospital Stay (HOSPITAL_COMMUNITY): Payer: Medicare HMO | Admitting: Speech Pathology

## 2014-07-03 DIAGNOSIS — I613 Nontraumatic intracerebral hemorrhage in brain stem: Secondary | ICD-10-CM

## 2014-07-03 LAB — GLUCOSE, CAPILLARY
GLUCOSE-CAPILLARY: 152 mg/dL — AB (ref 70–99)
GLUCOSE-CAPILLARY: 198 mg/dL — AB (ref 70–99)
GLUCOSE-CAPILLARY: 120 mg/dL — AB (ref 70–99)
Glucose-Capillary: 99 mg/dL (ref 70–99)

## 2014-07-03 NOTE — Plan of Care (Signed)
Problem: RH Balance Goal: LTG Patient will maintain dynamic standing balance (PT) LTG: Patient will maintain dynamic standing balance with assistance during mobility activities (PT)  Outcome: Not Applicable Date Met:  81/01/75 N/A due to slow pt progress.

## 2014-07-03 NOTE — Plan of Care (Signed)
Problem: RH Ambulation Goal: LTG Patient will ambulate in controlled environment (PT) LTG: Patient will ambulate in a controlled environment, # of feet with assistance (PT).  Outcome: Not Applicable Date Met:  54/86/88 Ambulation not safe means of functional mobility at this time.

## 2014-07-03 NOTE — Progress Notes (Signed)
Occupational Therapy Session Note  Patient Details  Name: [name] MRN: [id number] Date of Birth: April 11, 1931  Today's Date: 07/03/2014 OT Individual Time: 3832-9191 and 1110-1145 OT Individual Time Calculation (min): 53 min and 35 min   Short Term Goals: Week 3:  OT Short Term Goal 1 (Week 3): Pt will perform LB dressing with Max A OT Short Term Goal 2 (Week 3): Pt will perform UB dressing with Min A OT Short Term Goal 3 (Week 3): Pt will perform BSC toilet transfers at [place] Term Goal 4 (Week 3): Pt will complete 1 of 3 toileting tasks  Skilled Therapeutic Interventions/Progress Updates:   1)  Participated in bed mobility, rolling side-to-side to have buttocks washed and to donn brief.  Provided verbal cues with supine <> sit to engage in bathing and grooming tasks seated at EOB, utilizing hand-over-hand technique with LUE to wash RUE and to apply deodorant, as well as for LUE to assist with opening toiletry items.  Upon completing bathing task, Pt requested to sit in w/c, multiple verbal and tactile cues provided, as well as incorporating visual target on floor, to encourage Pt to lean trunk forward and bear weight through BLEs upon raising to engage in squat pivot transfers from EOB > w/c.   2)  Engaged in therapeutic activity in therapy gym focusing on encouraging forward trunk flexion while seated at edge of mat table in order to place and retrieve rings placed at floor level.  Activity challenged dynamic sitting balance and encouraged Pt to redirect posture back into midline position upon reaching for objects.  Engaged in squat pivot transfer from edge of mat table <> w/c, providing tactile and verbal cues for trunk flexion and to bear weight through BLEs upon engaging transfer.       Therapy Documentation Precautions:  Precautions Precautions: Fall Restrictions Weight Bearing Restrictions: No General:   Vital Signs:   Pain: Pain Assessment Pain Assessment:  No/denies pain Pain Score: 0-No pain ADL:    See FIM for current functional status  Therapy/Group: Individual Therapy  Nadiah Corbit 07/03/2014, 10:16 AM

## 2014-07-03 NOTE — Progress Notes (Signed)
Subjective/Complaints: 80 y.o. right-handed male with history diabetes mellitus with peripheral neuropathy, CAD status post CABG, atrial flutter on chronic Coumadin, chronic renal insufficiency with baseline creatinine 1.24, chronic diastolic congestive heart failure. Independent with occasional cane and driving living with family. Admitted 06/11/2049 of left-sided weakness and slurred speech as well as recent fall but denies striking his head. Cranial CT scan shows a right pontomedullary 14 x 22 mm oval focus consistent with acute hemorrhage. INR on admission of 5.80 and systolic blood pressure 998. Placed on intravenous Lopressor and Coumadin was reversed. Followup cranial CT scan unchanged. 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 Tolerating a regular consistency diet. Coumadin remains on hold with cardiac rate control.  Not asking to void per OT  Review of Systems - Negative except weak on left  Objective: Vital Signs: Blood pressure 127/53, pulse 54, temperature 98.2 F (36.8 C), temperature source Oral, resp. rate 18, weight 78.472 kg (173 lb), SpO2 99.00%. No results found. Results for orders placed during the hospital encounter of 06/14/14 (from the past 72 hour(s))  GLUCOSE, CAPILLARY     Status: Abnormal   Collection Time    06/30/14 11:51 AM      Result Value Ref Range   Glucose-Capillary 138 (*) 70 - 99 mg/dL  GLUCOSE, CAPILLARY     Status: Abnormal   Collection Time    06/30/14  4:57 PM      Result Value Ref Range   Glucose-Capillary 126 (*) 70 - 99 mg/dL  GLUCOSE, CAPILLARY     Status: Abnormal   Collection Time    06/30/14  9:47 PM      Result Value Ref Range   Glucose-Capillary 110 (*) 70 - 99 mg/dL  GLUCOSE, CAPILLARY     Status: None   Collection Time    07/01/14  4:15 AM      Result Value Ref Range   Glucose-Capillary 95  70 - 99 mg/dL  BASIC METABOLIC PANEL     Status: Abnormal   Collection Time    07/01/14  5:51 AM   Result Value Ref Range   Sodium 137  137 - 147 mEq/L   Potassium 5.0  3.7 - 5.3 mEq/L   Chloride 102  96 - 112 mEq/L   CO2 21  19 - 32 mEq/L   Glucose, Bld 99  70 - 99 mg/dL   BUN 49 (*) 6 - 23 mg/dL   Creatinine, Ser 3.41 (*) 0.50 - 1.35 mg/dL   Calcium 9.9  8.4 - 10.5 mg/dL   GFR calc non Af Amer 16 (*) >90 mL/min   GFR calc Af Amer 18 (*) >90 mL/min   Comment: (NOTE)     The eGFR has been calculated using the CKD EPI equation.     This calculation has not been validated in all clinical situations.     eGFR's persistently <90 mL/min signify possible Chronic Kidney     Disease.   Anion gap 14  5 - 15  GLUCOSE, CAPILLARY     Status: None   Collection Time    07/01/14  7:12 AM      Result Value Ref Range   Glucose-Capillary 91  70 - 99 mg/dL  GLUCOSE, CAPILLARY     Status: Abnormal   Collection Time    07/01/14 11:37 AM      Result Value Ref Range   Glucose-Capillary 138 (*) 70 - 99 mg/dL   Comment 1 Notify RN  GLUCOSE, CAPILLARY     Status: Abnormal   Collection Time    07/01/14  4:32 PM      Result Value Ref Range   Glucose-Capillary 220 (*) 70 - 99 mg/dL   Comment 1 Notify RN    GLUCOSE, CAPILLARY     Status: Abnormal   Collection Time    07/01/14  9:17 PM      Result Value Ref Range   Glucose-Capillary 125 (*) 70 - 99 mg/dL  BASIC METABOLIC PANEL     Status: Abnormal   Collection Time    07/02/14  5:57 AM      Result Value Ref Range   Sodium 137  137 - 147 mEq/L   Potassium 5.3  3.7 - 5.3 mEq/L   Chloride 101  96 - 112 mEq/L   CO2 21  19 - 32 mEq/L   Glucose, Bld 103 (*) 70 - 99 mg/dL   BUN 49 (*) 6 - 23 mg/dL   Creatinine, Ser 3.28 (*) 0.50 - 1.35 mg/dL   Calcium 10.0  8.4 - 10.5 mg/dL   GFR calc non Af Amer 16 (*) >90 mL/min   GFR calc Af Amer 19 (*) >90 mL/min   Comment: (NOTE)     The eGFR has been calculated using the CKD EPI equation.     This calculation has not been validated in all clinical situations.     eGFR's persistently <90 mL/min signify  possible Chronic Kidney     Disease.   Anion gap 15  5 - 15  GLUCOSE, CAPILLARY     Status: Abnormal   Collection Time    07/02/14  7:05 AM      Result Value Ref Range   Glucose-Capillary 105 (*) 70 - 99 mg/dL  GLUCOSE, CAPILLARY     Status: Abnormal   Collection Time    07/02/14 12:08 PM      Result Value Ref Range   Glucose-Capillary 147 (*) 70 - 99 mg/dL   Comment 1 Notify RN    GLUCOSE, CAPILLARY     Status: Abnormal   Collection Time    07/02/14  4:21 PM      Result Value Ref Range   Glucose-Capillary 191 (*) 70 - 99 mg/dL  GLUCOSE, CAPILLARY     Status: Abnormal   Collection Time    07/02/14  9:23 PM      Result Value Ref Range   Glucose-Capillary 149 (*) 70 - 99 mg/dL  GLUCOSE, CAPILLARY     Status: None   Collection Time    07/03/14  7:14 AM      Result Value Ref Range   Glucose-Capillary 99  70 - 99 mg/dL   Comment 1 Notify RN       HEENT: normal Cardio: RRR and no murmur Resp: CTA B/L and unlabored GI: BS positive and NT, ND Extremity:  No Edema Skin:   Other Left transerve healed breast incision at nipple level, left upper back incion well healed, dry skin both feet without open areas Neuro: Lethargic, Flat, Cranial Nerve Abnormalities Left central 7, Abnormal Sensory decreased in both feet, Abnormal Motor bilateral hand intrinsic atrophy and Other motor tr Left bi, tri grip, 3- Left HF, KE , 2- ankle DF Musc/Skel:  Other no pain with AROM in BUE and BLE, or with neck movement, sitting balance fair minus Left shoulder sublux Gen NAD   Assessment/Plan: 1. Functional deficits secondary to RIght pontomedullary hemorrhage with Left hemiparesis superimposed on  severe diabetic neuropathy which require 3+ hours per day of interdisciplinary therapy in a comprehensive inpatient rehab setting. Physiatrist is providing close team supervision and 24 hour management of active medical problems listed below. Physiatrist and rehab team continue to assess barriers to  discharge/monitor patient progress toward functional and medical goals.   FIM: FIM - Bathing Bathing Steps Patient Completed: Chest;Left Arm;Abdomen;Front perineal area;Right upper leg;Left upper leg Bathing: 3: Mod-Patient completes 5-7 27f 10 parts or 50-74%  FIM - Upper Body Dressing/Undressing Upper body dressing/undressing steps patient completed: Pull shirt over trunk Upper body dressing/undressing: 0: Wears gown/pajamas-no public clothing FIM - Lower Body Dressing/Undressing Lower body dressing/undressing steps patient completed: Thread/unthread left pants leg Lower body dressing/undressing: 0: Wears Interior and spatial designer  FIM - Musician Devices: Grab bar or rail for support Toileting: 1: Total-Patient completed zero steps, helper did all 3  FIM - Radio producer Devices: Bedside commode;Grab bars Toilet Transfers: 2-To toilet/BSC: Max A (lift and lower assist);1-From toilet/BSC: Total A (helper does all/Pt. < 25%);1-Two helpers  FIM - Control and instrumentation engineer Devices: Bed rails;Arm rests Bed/Chair Transfer: 5: Supine > Sit: Supervision (verbal cues/safety issues);1: Two helpers  FIM - Locomotion: Wheelchair Distance: 25 Locomotion: Wheelchair: 1: Total Assistance/staff pushes wheelchair (Pt<25%) FIM - Locomotion: Ambulation Locomotion: Ambulation Assistive Devices: Nutritional therapist Ambulation/Gait Assistance: 1: +2 Total assist;3: Mod assist Locomotion: Ambulation: 0: Activity did not occur  Comprehension Comprehension Mode: Auditory Comprehension: 5-Understands basic 90% of the time/requires cueing < 10% of the time  Expression Expression Mode: Verbal Expression: 4-Expresses basic 75 - 89% of the time/requires cueing 10 - 24% of the time. Needs helper to occlude trach/needs to repeat words.  Social Interaction Social Interaction: 4-Interacts appropriately 75 - 89% of the time - Needs  redirection for appropriate language or to initiate interaction.  Problem Solving Problem Solving: 3-Solves basic 50 - 74% of the time/requires cueing 25 - 49% of the time  Memory Memory: 2-Recognizes or recalls 25 - 49% of the time/requires cueing 51 - 75% of the time  Medical Problem List and Plan:  1. 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 Latest cranial CT scan stable  2. DVT Prophylaxis/Anticoagulation: Subcutaneous heparin initiated for DVT prophylaxis 06/13/2014  3. Pain Management: Tylenol as needed. Monitor the increased mobility  4. Diabetes mellitus with peripheral neuropathy. Latest hemoglobin A1c 6.7. Check blood sugars a.c. and at bedtime.Tradjenta 5 mg daily.Just on Trajenta now, Patient on Levemir 15 units each bedtime and Onglyza 2.5 mg daily prior to admission.    -consider resuming levemir if sugars remain elevated 5. Neuropsych: This patient is not capable of making decisions on his own behalf.  6. Skin/Wound Care: Routine skin checks  7. Chronic renal insufficiency. Baseline creatinine 2.84. Followup chemistries show stable GFR ~20  -Cr up to >3  -monitor wts  -off IVF, lasix on hold, monitor  -continue to encourage fluids    -recheck bmet tomorrow 8. Chronic diastolic congestive heart failure. Monitor for any signs of fluid overload. Patient on Lasix 80 mg Daily.(held given #7)  9. Hypertension. Norvasc 10 mg daily, Coreg 6.25 mg twice a day. Monitor with increased mobility  10. CAD with history of CABG. No chest pain or shortness of breath  11. Hyperlipidemia. Lipitor  12. GERD. Protonix   LOS (Days) 19 A FACE TO FACE EVALUATION WAS PERFORMED   Jaydis Duchene E 07/03/2014, 7:47 AM

## 2014-07-03 NOTE — Plan of Care (Signed)
Problem: RH Problem Solving Goal: LTG Patient will demonstrate problem solving for (SLP) LTG: Patient will demonstrate problem solving for basic/complex daily situations with cues (SLP)  Downgraded 10/07  Problem: RH Memory Goal: LTG Patient will use memory compensatory aids to (SLP) LTG: Patient will use memory compensatory aids to recall biographical/new, daily complex information with cues (SLP)  Downgraded 10/07  Problem: RH Awareness Goal: LTG: Patient will demonstrate intellectual/emergent (SLP) LTG: Patient will demonstrate intellectual/emergent/anticipatory awareness with assist during a cognitive/linguistic activity (SLP)  Downgraded 10/7

## 2014-07-03 NOTE — Plan of Care (Signed)
Problem: RH Ambulation Goal: LTG Patient will ambulate in home environment (PT) LTG: Patient will ambulate in home environment, # of feet with assistance (PT).  Outcome: Not Applicable Date Met:  57/01/77 Ambulation not safe means of functional mobility at this time.

## 2014-07-03 NOTE — Progress Notes (Addendum)
Physical Therapy Session Note  Patient Details  Name: Cooke Cooke MRN: [id number] Date of Birth: [birth year]  Today's Date: 07/03/2014 PT Individual Time: 0830-0930 PT Individual Time Calculation (min): 60 min   Short Term Goals: Week 3:  PT Short Term Goal 1 (Week 3): Pt will consistently perform sit>supine with min A with HOB flat with 25% cueing for technique. PT Short Term Goal 2 (Week 3): Pt will consistently transfer from bed<>w/c to R and L sides with mod A and 50% cueing for technique. PT Short Term Goal 3 (Week 3): Pt will consistently perform sit<>stand with mod A and 50% cueing for technique. PT Short Term Goal 4 (Week 3): Pt will perform gait x10' in controlled environment with Total A of single therapist. PT Short Term Goal 5 (Week 3): Pt will perform w/c mobility x100' in controlled environment with min A and 25% cueing.  Skilled Therapeutic Interventions/Progress Updates:    2:1. Pt received seated in w/c having just completed B&D; agreeable to therapy. Session continued to focus on increasing consistency of functional transfers across staff members. Transported pt to gym in w/c with total A for energy conservation. Performed lateral scooting transfer from w/c>mat table (to R side) with max A (hand over back technique), verbal cueing and visual target to initiate anterior weight shift, tactile cueing at bilat ribcage to facilitate active lifting. Sit>stand from raised mat table with total A of single therapist with tactile cueing at bilat knees for increased weightbearing, proprioception. With mirror positioned anterior to pt for visual feedback, attempted static standing with bilat UE support at L PFRW for increased LUE weightbearing, midline posture; however, pt with increased pushing to L side using L PFRW. See below for detailed description of NMR interventions. Noted pt initiated use of compensatory strategy of sliding RUE across mat table to facilitate lateral trunk lean to R  side during transfer to L. Session ended in pt room, where pt was left seated in w/c with quick release belt in place for safety and all needs within reach.  Long term goals addressing gait were discharged secondary to slow pt progress. Discharged goals for car and furniture transfers due to change in D/C destination. Addendum: Downgraded LTG's addressing basic transfers and awareness. Discharged goal for dynamic standing balance; added goal for dynamic sitting balance.  Therapy Documentation Precautions:  Precautions Precautions: Fall Restrictions Weight Bearing Restrictions: No Pain: Pain Assessment Pain Assessment: No/denies pain Pain Score: 0-No pain NMR: Neuromuscular Facilitation: Right;Upper Extremity;Lower Extremity;Activity to increase lateral weight shifting;Activity to increase sustained activation;Activity to increase anterior-posterior weight shifting;Activity to increase motor control;Activity to increase grading;Left Seated EOM with mirror positioned anterior to pt for visual feedback, pt performed RUE anterolateral reaching for midline orientation, functional weight shifting and scooting to promote pt independence with functional transfers. Reaching targets placed in such a way to promote midline posture during active lifting of buttocks from raised mat table. Tactile cueing at bilat ribcage to emphasize erect trunk flexion. Pt able to effectively utilize mirror (with cueing) to reorient self to midline self-correct tendency toward RUE/LE pushing.  See FIM for current functional status  Therapy/Group: Individual Therapy  Cooke Cooke, Cooke Cooke 07/03/2014, 7:35 PM

## 2014-07-03 NOTE — Plan of Care (Signed)
Problem: RH Furniture Transfers Goal: LTG Patient will perform furniture transfers w/assist (OT/PT LTG: Patient will perform furniture transfers with assistance (OT/PT).  Outcome: Not Applicable Date Met:  66/91/67 N/A due to change in D/C destination

## 2014-07-03 NOTE — Progress Notes (Signed)
Speech Language Pathology Daily Session Note  Patient Details  Name: [name] MRN: [id number] Date of Birth: [birth year]  Today's Date: 07/03/2014 SLP Individual Time: 1425-1510 SLP Individual Time Calculation (min): 45 min  Short Term Goals: Week 3: SLP Short Term Goal 1 (Week 3): Patient will utilize speech strategies (slow down, over-articulate, pause between words) to achieve >80% intelligibility at phrase level, with supervision cues. SLP Short Term Goal 2 (Week 3): Patient will demonstrate intellectual awareness by identifying at least 1 speech and 1 physical deficit, with min assist. SLP Short Term Goal 3 (Week 3): Pt will use environmental aids to facilitate improved recall of daily information for 75% accuracy wtih mod assist  SLP Short Term Goal 4 (Week 3): Patient will solve basic level functional problems wtih 80% accuracy with supervision. SLP Short Term Goal 5 (Week 3): Pt will return demonstration of oral motor exercises to improve left sided weakness.    Skilled Therapeutic Interventions: Skilled treatment session focused on cognitive and speech goals. SLP facilitated session by providing extra time with Mod A multimodal cues for functional problem solving with a basic money management task in regards to counting money and making appropriate change. Patient recalled his speech intelligibility strategy of "talking loud" with supervision question cues and required Min A multimodal cues for utilization at the phrase level.  Patient also engaged in a functional conversation and identified one 1 speech deficit and 1 physical deficit with supervision question cues. Patient requested to get into bed at end of session and required total A for recall of sequencing for transfer and +2 for safety.  Patient left supine in bed with alarm on and all needs within reach. Continue with current plan of care.    FIM:  Comprehension Comprehension Mode: Auditory Comprehension: 5-Understands  basic 90% of the time/requires cueing < 10% of the time Expression Expression Mode: Verbal Expression: 4-Expresses basic 75 - 89% of the time/requires cueing 10 - 24% of the time. Needs helper to occlude trach/needs to repeat words. Social Interaction Social Interaction: 4-Interacts appropriately 75 - 89% of the time - Needs redirection for appropriate language or to initiate interaction. Problem Solving Problem Solving: 3-Solves basic 50 - 74% of the time/requires cueing 25 - 49% of the time Memory Memory: 2-Recognizes or recalls 25 - 49% of the time/requires cueing 51 - 75% of the time FIM - Eating Eating Activity: 5: Supervision/cues;6: More than reasonable amount of time;5: Set-up assist for open containers  Pain Pain Assessment Pain Assessment: No/denies pain  Therapy/Group: Individual Therapy  Makella Buckingham 07/03/2014, 3:30 PM

## 2014-07-03 NOTE — Plan of Care (Signed)
Problem: RH Car Transfers Goal: LTG Patient will perform car transfers with assist (PT) LTG: Patient will perform car transfers with assistance (PT).  Outcome: Not Applicable Date Met:  70/62/37 N/a due to change in D/C destination.

## 2014-07-04 ENCOUNTER — Inpatient Hospital Stay (HOSPITAL_COMMUNITY): Payer: Medicare HMO | Admitting: Occupational Therapy

## 2014-07-04 ENCOUNTER — Inpatient Hospital Stay (HOSPITAL_COMMUNITY): Payer: Medicare Other | Admitting: Speech Pathology

## 2014-07-04 ENCOUNTER — Inpatient Hospital Stay (HOSPITAL_COMMUNITY): Payer: Medicare HMO | Admitting: Physical Therapy

## 2014-07-04 LAB — BASIC METABOLIC PANEL
Anion gap: 13 (ref 5–15)
BUN: 50 mg/dL — ABNORMAL HIGH (ref 6–23)
CO2: 20 mEq/L (ref 19–32)
Calcium: 10.1 mg/dL (ref 8.4–10.5)
Chloride: 101 mEq/L (ref 96–112)
Creatinine, Ser: 3.64 mg/dL — ABNORMAL HIGH (ref 0.50–1.35)
GFR calc Af Amer: 17 mL/min — ABNORMAL LOW (ref >90)
GFR calc non Af Amer: 14 mL/min — ABNORMAL LOW (ref >90)
Glucose, Bld: 148 mg/dL — ABNORMAL HIGH (ref 70–99)
POTASSIUM: 6.1 meq/L — AB (ref 3.7–5.3)
SODIUM: 134 meq/L — AB (ref 137–147)

## 2014-07-04 LAB — GLUCOSE, CAPILLARY
GLUCOSE-CAPILLARY: 124 mg/dL — AB (ref 70–99)
GLUCOSE-CAPILLARY: 112 mg/dL — AB (ref 70–99)
Glucose-Capillary: 161 mg/dL — ABNORMAL HIGH (ref 70–99)
Glucose-Capillary: 121 mg/dL — ABNORMAL HIGH (ref 70–99)

## 2014-07-04 MED ORDER — SODIUM POLYSTYRENE SULFONATE 15 GM/60ML PO SUSP
30.0000 g | Freq: Once | ORAL | Status: AC
  Administered 2014-07-04: 30 g via ORAL
  Filled 2014-07-04: qty 120

## 2014-07-04 NOTE — Progress Notes (Signed)
Speech Language Pathology Daily Session Note  Patient Details  Name: [name] MRN: [id number] Date of Birth: 1930/11/08  Today's Date: 07/04/2014 SLP Individual Time: 1122-1207 SLP Individual Time Calculation (min): 45 min  Short Term Goals: Week 3: SLP Short Term Goal 1 (Week 3): Patient will utilize speech strategies (slow down, over-articulate, pause between words) to achieve >80% intelligibility at phrase level, with supervision cues. SLP Short Term Goal 2 (Week 3): Patient will demonstrate intellectual awareness by identifying at least 1 speech and 1 physical deficit, with min assist. SLP Short Term Goal 3 (Week 3): Pt will use environmental aids to facilitate improved recall of daily information for 75% accuracy wtih mod assist  SLP Short Term Goal 4 (Week 3): Patient will solve basic level functional problems wtih 80% accuracy with supervision. SLP Short Term Goal 5 (Week 3): Pt will return demonstration of oral motor exercises to improve left sided weakness.    Skilled Therapeutic Interventions:  Pt was seen for skilled ST targeting cognitive-linguistic goals.  Pt initiated asking for assistance to address toileting needs prior to leaving the room for therapy with min question cues.  SLP facilitated the session with a structured generative naming task targeting use of dysarthria strategies and basic problem solving.  Pt required overall mod assist to complete task secondary to decreased working memory and mental flexibility; however, he was noted to utilize loud voice to improve speech intelligibility in context to ~80%.  SLP also facilitated the session with a basic visuospatial/constructional task targeting functional problem solving with pt requiring overall mod assist to complete task.  Continue per current plan of care.     FIM:  Comprehension Comprehension Mode: Auditory Comprehension: 5-Understands basic 90% of the time/requires cueing < 10% of the  time Expression Expression Mode: Verbal Expression: 4-Expresses basic 75 - 89% of the time/requires cueing 10 - 24% of the time. Needs helper to occlude trach/needs to repeat words. Social Interaction Social Interaction: 4-Interacts appropriately 75 - 89% of the time - Needs redirection for appropriate language or to initiate interaction. Problem Solving Problem Solving: 3-Solves basic 50 - 74% of the time/requires cueing 25 - 49% of the time Memory Memory: 2-Recognizes or recalls 25 - 49% of the time/requires cueing 51 - 75% of the time  Pain Pain Assessment Pain Assessment: No/denies pain  Therapy/Group: Individual Therapy  Stacee Earp, Selinda Orion 07/04/2014, 12:56 PM

## 2014-07-04 NOTE — Progress Notes (Signed)
Subjective/Complaints: 80 y.o. right-handed male with history diabetes mellitus with peripheral neuropathy, CAD status post CABG, atrial flutter on chronic Coumadin, chronic renal insufficiency with baseline creatinine 5.05, chronic diastolic congestive heart failure. Independent with occasional cane and driving living with family. Admitted 06/11/2049 of left-sided weakness and slurred speech as well as recent fall but denies striking his head. Cranial CT scan shows a right pontomedullary 14 x 22 mm oval focus consistent with acute hemorrhage. INR on admission of 3.97 and systolic blood pressure 673. Placed on intravenous Lopressor and Coumadin was reversed. Followup cranial CT scan unchanged. 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 Tolerating a regular consistency diet. Coumadin remains on hold with cardiac rate control.  No pain c/os  Review of Systems - Negative except weak on left  Objective: Vital Signs: Blood pressure 134/64, pulse 50, temperature 98.7 F (37.1 C), temperature source Oral, resp. rate 18, weight 89.631 kg (197 lb 9.6 oz), SpO2 97.00%. No results found. Results for orders placed during the hospital encounter of 06/14/14 (from the past 72 hour(s))  GLUCOSE, CAPILLARY     Status: Abnormal   Collection Time    07/01/14 11:37 AM      Result Value Ref Range   Glucose-Capillary 138 (*) 70 - 99 mg/dL   Comment 1 Notify RN    GLUCOSE, CAPILLARY     Status: Abnormal   Collection Time    07/01/14  4:32 PM      Result Value Ref Range   Glucose-Capillary 220 (*) 70 - 99 mg/dL   Comment 1 Notify RN    GLUCOSE, CAPILLARY     Status: Abnormal   Collection Time    07/01/14  9:17 PM      Result Value Ref Range   Glucose-Capillary 125 (*) 70 - 99 mg/dL  BASIC METABOLIC PANEL     Status: Abnormal   Collection Time    07/02/14  5:57 AM      Result Value Ref Range   Sodium 137  137 - 147 mEq/L   Potassium 5.3  3.7 - 5.3 mEq/L   Chloride 101  96  - 112 mEq/L   CO2 21  19 - 32 mEq/L   Glucose, Bld 103 (*) 70 - 99 mg/dL   BUN 49 (*) 6 - 23 mg/dL   Creatinine, Ser 3.28 (*) 0.50 - 1.35 mg/dL   Calcium 10.0  8.4 - 10.5 mg/dL   GFR calc non Af Amer 16 (*) >90 mL/min   GFR calc Af Amer 19 (*) >90 mL/min   Comment: (NOTE)     The eGFR has been calculated using the CKD EPI equation.     This calculation has not been validated in all clinical situations.     eGFR's persistently <90 mL/min signify possible Chronic Kidney     Disease.   Anion gap 15  5 - 15  GLUCOSE, CAPILLARY     Status: Abnormal   Collection Time    07/02/14  7:05 AM      Result Value Ref Range   Glucose-Capillary 105 (*) 70 - 99 mg/dL  GLUCOSE, CAPILLARY     Status: Abnormal   Collection Time    07/02/14 12:08 PM      Result Value Ref Range   Glucose-Capillary 147 (*) 70 - 99 mg/dL   Comment 1 Notify RN    GLUCOSE, CAPILLARY     Status: Abnormal   Collection Time    07/02/14  4:21 PM  Result Value Ref Range   Glucose-Capillary 191 (*) 70 - 99 mg/dL  GLUCOSE, CAPILLARY     Status: Abnormal   Collection Time    07/02/14  9:23 PM      Result Value Ref Range   Glucose-Capillary 149 (*) 70 - 99 mg/dL  GLUCOSE, CAPILLARY     Status: None   Collection Time    07/03/14  7:14 AM      Result Value Ref Range   Glucose-Capillary 99  70 - 99 mg/dL   Comment 1 Notify RN    GLUCOSE, CAPILLARY     Status: Abnormal   Collection Time    07/03/14 11:54 AM      Result Value Ref Range   Glucose-Capillary 152 (*) 70 - 99 mg/dL   Comment 1 Notify RN    GLUCOSE, CAPILLARY     Status: Abnormal   Collection Time    07/03/14  4:25 PM      Result Value Ref Range   Glucose-Capillary 198 (*) 70 - 99 mg/dL  GLUCOSE, CAPILLARY     Status: Abnormal   Collection Time    07/03/14  9:22 PM      Result Value Ref Range   Glucose-Capillary 120 (*) 70 - 99 mg/dL  GLUCOSE, CAPILLARY     Status: Abnormal   Collection Time    07/04/14  6:36 AM      Result Value Ref Range    Glucose-Capillary 124 (*) 70 - 99 mg/dL     HEENT: normal Cardio: RRR and no murmur Resp: CTA B/L and unlabored GI: BS positive and NT, ND Extremity:  No Edema Skin:   Other Left transerve healed breast incision at nipple level, left upper back incion well healed, dry skin both feet without open areas Neuro: Lethargic, Flat, Cranial Nerve Abnormalities Left central 7, Abnormal Sensory decreased in both feet, Abnormal Motor bilateral hand intrinsic atrophy and Other motor tr Left bi, tri grip, 3- Left HF, KE , 2- ankle DF Musc/Skel:  Other no pain with AROM in BUE and BLE, or with neck movement, sitting balance fair minus Left shoulder sublux Gen NAD   Assessment/Plan: 1. Functional deficits secondary to RIght pontomedullary hemorrhage with Left hemiparesis superimposed on severe diabetic neuropathy which require 3+ hours per day of interdisciplinary therapy in a comprehensive inpatient rehab setting. Physiatrist is providing close team supervision and 24 hour management of active medical problems listed below. Physiatrist and rehab team continue to assess barriers to discharge/monitor patient progress toward functional and medical goals.   FIM: FIM - Bathing Bathing Steps Patient Completed: Chest;Left Arm;Abdomen;Front perineal area;Right upper leg;Left upper leg Bathing: 3: Mod-Patient completes 5-7 79f 10 parts or 50-74%  FIM - Upper Body Dressing/Undressing Upper body dressing/undressing steps patient completed: Pull shirt over trunk Upper body dressing/undressing: 0: Wears gown/pajamas-no public clothing FIM - Lower Body Dressing/Undressing Lower body dressing/undressing steps patient completed: Thread/unthread left pants leg Lower body dressing/undressing: 0: Wears Interior and spatial designer  FIM - Musician Devices: Grab bar or rail for support Toileting: 1: Total-Patient completed zero steps, helper did all 3  FIM - Sport and exercise psychologist Devices: Bedside commode;Grab bars Toilet Transfers: 2-To toilet/BSC: Max A (lift and lower assist);1-From toilet/BSC: Total A (helper does all/Pt. < 25%);1-Two helpers  FIM - Control and instrumentation engineer Devices: Arm rests Bed/Chair Transfer: 5: Supine > Sit: Supervision (verbal cues/safety issues);5: Sit > Supine: Supervision (verbal cues/safety issues);2: Bed > Chair or W/C: Max  A (lift and lower assist)  FIM - Locomotion: Wheelchair Distance: 25 Locomotion: Wheelchair: 1: Total Assistance/staff pushes wheelchair (Pt<25%) FIM - Locomotion: Ambulation Locomotion: Ambulation Assistive Devices: Ethelene Hal Ambulation/Gait Assistance: 1: +2 Total assist;3: Mod assist Locomotion: Ambulation: 0: Activity did not occur  Comprehension Comprehension Mode: Auditory Comprehension: 5-Understands basic 90% of the time/requires cueing < 10% of the time  Expression Expression Mode: Verbal Expression: 4-Expresses basic 75 - 89% of the time/requires cueing 10 - 24% of the time. Needs helper to occlude trach/needs to repeat words.  Social Interaction Social Interaction: 4-Interacts appropriately 75 - 89% of the time - Needs redirection for appropriate language or to initiate interaction.  Problem Solving Problem Solving: 3-Solves basic 50 - 74% of the time/requires cueing 25 - 49% of the time  Memory Memory: 2-Recognizes or recalls 25 - 49% of the time/requires cueing 51 - 75% of the time  Medical Problem List and Plan:  1. 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 Latest cranial CT scan stable  2. DVT Prophylaxis/Anticoagulation: Subcutaneous heparin initiated for DVT prophylaxis 06/13/2014  3. Pain Management: Tylenol as needed. Monitor the increased mobility  4. Diabetes mellitus with peripheral neuropathy. Latest hemoglobin A1c 6.7. Check blood sugars a.c. and at bedtime.Tradjenta 5 mg daily.Just on  Trajenta now, Patient on Levemir 15 units each bedtime and Onglyza 2.5 mg daily prior to admission.    -consider resuming levemir if sugars remain elevated 5. Neuropsych: This patient is not capable of making decisions on his own behalf.  6. Skin/Wound Care: Routine skin checks  7. Chronic renal insufficiency. Baseline creatinine 2.84. Followup chemistries show stable GFR ~20  -Cr up to >3  -monitor wts  -off IVF, lasix on hold, monitor  -continue to encourage fluids    -recheck bmet tomorrow 8. Chronic diastolic congestive heart failure. Monitor for any signs of fluid overload. Patient on Lasix 80 mg Daily.(held given #7) no peripheral edema 9. Hypertension. Norvasc 10 mg daily, Coreg 6.25 mg twice a day. Monitor with increased mobility  10. CAD with history of CABG. No chest pain or shortness of breath  11. Hyperlipidemia. Lipitor  12. GERD. Protonix   LOS (Days) 20 A FACE TO FACE EVALUATION WAS PERFORMED   KIRSTEINS,ANDREW E 07/04/2014, 7:44 AM

## 2014-07-04 NOTE — Progress Notes (Addendum)
Physical Therapy Session Note  Patient Details  Name: [name] MRN: [id number] Date of Birth: 02/16/1931  Today's Date: 07/04/2014 PT Individual Time: 8676-1950 PT Individual Time Calculation (min): 64 min   Short Term Goals: Week 3:  PT Short Term Goal 1 (Week 3): Pt will consistently perform sit>supine with min A with HOB flat with 25% cueing for technique. PT Short Term Goal 2 (Week 3): Pt will consistently transfer from bed<>w/c to R and L sides with mod A and 50% cueing for technique. PT Short Term Goal 3 (Week 3): Pt will consistently perform sit<>stand with mod A and 50% cueing for technique. PT Short Term Goal 4 (Week 3): Pt will perform gait x10' in controlled environment with Total A of single therapist. PT Short Term Goal 5 (Week 3): Pt will perform w/c mobility x100' in controlled environment with min A and 25% cueing.  Skilled Therapeutic Interventions/Progress Updates:    1:1. Pt received semi reclined in bed; agreeable to therapy. Session began 11 minutes later than scheduled time due to pt requesting to finish breakfast. Session focused on educating pt on use of one-arm drive w/c, continuing to promote pt independence and staff consistency with functional transfers. Pt performed supine>sit with HOB flat using rail requiring supervision. Performed lateral scooting transfer from bed>w/c (to L side) using hand over back technique with mod A overall. Required significantly increased time due to focus of transfer being setup, sequencing, pt initiation and active performance of transfer. Cueing focused on primarily using LE's as opposed to pushing/pulling with RUE. Tactile cueing promoted RUE anterolateral reaching (when scooting to L side) using open hand.   Remainder of session focused on explaining, demonstrating use of R one-arm drive w/c for self-propulsion. Pt performed w/c mobility x75' total in controlled environment with max A overall, HOH assist at R hand to demonstrate  proper use of one-arm drive component. Cueing focused on ensuring R hand was on outside hand rim, as pt tended to mistakenly maintain R thenar eminence on inside hand rim. Session ended in pt room, where pt was left seated in w/c with quick release belt in place for safety and all needs within reach.  Therapy Documentation Precautions:  Precautions Precautions: Fall Restrictions Weight Bearing Restrictions: No Vital Signs: Therapy Vitals BP: 140/67 mmHg Pain: Pain Assessment Pain Assessment: No/denies pain Locomotion : Wheelchair Mobility Distance: 75   See FIM for current functional status  Therapy/Group: Individual Therapy  [name], Malva Cogan 07/04/2014, 10:36 AM

## 2014-07-04 NOTE — Progress Notes (Signed)
Occupational Therapy Session Note  Patient Details  Name: ARYEH BUTTERFIELD MRN: [id number] Date of Birth: 1931-03-24  Today's Date: 07/04/2014 OT Individual Time: 7564-3329 and 5188-4166 OT Individual Time Calculation (min): 47 min and 37 min   Short Term Goals: Week 3:  OT Short Term Goal 1 (Week 3): Pt will perform LB dressing with Max A OT Short Term Goal 2 (Week 3): Pt will perform UB dressing with Min A OT Short Term Goal 3 (Week 3): Pt will perform BSC toilet transfers at [place] Term Goal 4 (Week 3): Pt will complete 1 of 3 toileting tasks  Skilled Therapeutic Interventions/Progress Updates:   1)  Engaged in bed mobility, rolling from side-to-side in order to be assisted with washing buttocks and changing brief.  Supine > sit at EOB with supervision, verbal cuing with Pt demonstrating multiple small scoots in order for buttocks to be positioned at EOB and for BLE to touch floor prior to completing bathing task seated at EOB.  Focused on hand-over-hand technique to encourage use of LUE when bathing RUE and applying deodorant, Pt self-initiating technique with no verbal cues provided.  Pt initiated UB dressing task with carryover for hemi-dressing technique, attempting to thread LUE into hospital gown sleeve.  Discussed with Pt status of receiving personal clothing items to addressed UB and LB dressing tasks, Pt reporting clothes should be available soon.  2)  Focused on functional transfers from w/c <> edge of mat table, while having Pt sequence, verbalize to therapist, and perform the specific steps within the transfer.  Pt demonstrated carryover from previous treatment sessions and was able to recall and perform the majority of the steps, however required manual facilitation for anterior weight shift.  Continued to require verbal cues for appropriate foot placement on the floor and also with hand placement upon forward flexion prior to bearing weight through BLEs.  Pt reported  increased weakness in RLE throughout treatment session.          Therapy Documentation Precautions:  Precautions Precautions: Fall Restrictions Weight Bearing Restrictions: No General:   Vital Signs: Therapy Vitals BP: 140/67 mmHg Pain: Pain Assessment Pain Assessment: No/denies pain ADL:    See FIM for current functional status  Therapy/Group: Individual Therapy  Namir Neto 07/04/2014, 10:42 AM

## 2014-07-04 NOTE — Plan of Care (Signed)
Problem: RH Bathing Goal: LTG Patient will bathe with assist, cues/equipment (OT) LTG: Patient will bathe specified number of body parts with assist with/without cues using equipment (position) (OT)  Downgraded due to lack of progress and modified to complete LB bathing at bed level as unsafe to complete at sit > stand level  Problem: RH Dressing Goal: LTG Patient will perform lower body dressing w/assist (OT) LTG: Patient will perform lower body dressing with assist, with/without cues in positioning using equipment (OT)  Downgraded and modified due to lack of progress and unsafe to complete at sit > stand level  Problem: RH Toilet Transfers Goal: LTG Patient will perform toilet transfers w/assist (OT) LTG: Patient will perform toilet transfers with assist, with/without cues using equipment (OT)  Downgraded due to lack of progress  Problem: RH Tub/Shower Transfers Goal: LTG Patient will perform tub/shower transfers w/assist (OT) LTG: Patient will perform tub/shower transfers with assist, with/without cues using equipment (OT)  Outcome: Not Applicable Date Met:  88/28/00 D/C due to change in discharge plan and unsafe at this time

## 2014-07-04 NOTE — Progress Notes (Signed)
I have reviewed and agree with the attached treatment note.  Marjani Kobel, OTR/L 

## 2014-07-05 ENCOUNTER — Encounter (HOSPITAL_COMMUNITY): Payer: Medicare Other | Admitting: Occupational Therapy

## 2014-07-05 ENCOUNTER — Inpatient Hospital Stay (HOSPITAL_COMMUNITY): Payer: Medicare Other

## 2014-07-05 ENCOUNTER — Inpatient Hospital Stay (HOSPITAL_COMMUNITY): Payer: Medicare HMO

## 2014-07-05 ENCOUNTER — Inpatient Hospital Stay (HOSPITAL_COMMUNITY): Payer: Medicare Other | Admitting: Speech Pathology

## 2014-07-05 LAB — BASIC METABOLIC PANEL
Anion gap: 13 (ref 5–15)
BUN: 48 mg/dL — ABNORMAL HIGH (ref 6–23)
CO2: 19 mEq/L (ref 19–32)
CREATININE: 3.63 mg/dL — AB (ref 0.50–1.35)
Calcium: 10.1 mg/dL (ref 8.4–10.5)
Chloride: 104 mEq/L (ref 96–112)
GFR, EST AFRICAN AMERICAN: 17 mL/min — AB (ref >90)
GFR, EST NON AFRICAN AMERICAN: 14 mL/min — AB (ref >90)
Glucose, Bld: 106 mg/dL — ABNORMAL HIGH (ref 70–99)
Potassium: 5.6 mEq/L — ABNORMAL HIGH (ref 3.7–5.3)
Sodium: 136 mEq/L — ABNORMAL LOW (ref 137–147)

## 2014-07-05 LAB — GLUCOSE, CAPILLARY
GLUCOSE-CAPILLARY: 126 mg/dL — AB (ref 70–99)
GLUCOSE-CAPILLARY: 100 mg/dL — AB (ref 70–99)
Glucose-Capillary: 110 mg/dL — ABNORMAL HIGH (ref 70–99)
Glucose-Capillary: 121 mg/dL — ABNORMAL HIGH (ref 70–99)

## 2014-07-05 MED ORDER — SODIUM POLYSTYRENE SULFONATE 15 GM/60ML PO SUSP
45.0000 g | Freq: Once | ORAL | Status: AC
  Administered 2014-07-05: 45 g via ORAL
  Filled 2014-07-05: qty 180

## 2014-07-05 NOTE — Progress Notes (Signed)
Occupational Therapy Session Note  Patient Details  Name: [name] MRN: [id number] Date of Birth: 04-30-1931  Today's Date: 07/05/2014 OT Individual Time: 5885-0277 OT Individual Time Calculation (min): 64 min    Short Term Goals: Week 3:  OT Short Term Goal 1 (Week 3): Pt will perform LB dressing with Max A OT Short Term Goal 2 (Week 3): Pt will perform UB dressing with Min A OT Short Term Goal 3 (Week 3): Pt will perform BSC toilet transfers at [place] Term Goal 4 (Week 3): Pt will complete 1 of 3 toileting tasks  Skilled Therapeutic Interventions/Progress Updates:   Engaged in bed mobility, rolling side-to-side in order to be assisted with buttocks to be washed and brief changed, Pt unaware of dirty brief and incontinent of bowel.  Transferred from EOB > w/c utilizing over the back technique with transfer to further facilitate anterior weight shift.  Pt demonstrating carryover to scoot hips forward to EOB and to lean trunk forward prior to initiating bearing weight through BLEs, however needed further verbal cuing for foot positioning on floor and also for arm placement prior to transferring, provided Rt lateral foot block due to pushing with Rt foot during transfer.  Engaged in bathing task at w/c level in front of sink, question cuing for Pt to look in mirror and see if seated trunk posture was in midline position, Pt aware of curved trunk posture and initiated scooting hips over in w/c to address issue.   Therapy Documentation Precautions:  Precautions Precautions: Fall Restrictions Weight Bearing Restrictions: No General:   Vital Signs: Therapy Vitals BP: 138/58 mmHg Patient Position (if appropriate): Lying Pain: Pain Assessment Pain Assessment: No/denies pain ADL:    See FIM for current functional status  Therapy/Group: Individual Therapy  Bronx Brogden 07/05/2014, 10:38 AM

## 2014-07-05 NOTE — Progress Notes (Signed)
I have reviewed and agree with the attached treatment note.  Gumaro Brightbill, OTR/L 

## 2014-07-05 NOTE — Progress Notes (Signed)
Physical Therapy Session Note  Patient Details  Name: [name] MRN: [id number] Date of Birth: Dec 22, 1930  Today's Date: 07/05/2014 PT Individual Time: 1105-1205 PT Individual Time Calculation (min): 60 min   Short Term Goals: Week 1:  PT Short Term Goal 1 (Week 1): Pt will demonstrate rolling L in bed req min A PT Short Term Goal 1 - Progress (Week 1): Met PT Short Term Goal 2 (Week 1): Pt will demonstrate supine to sit req mod A PT Short Term Goal 2 - Progress (Week 1): Met PT Short Term Goal 3 (Week 1): Pt will demonstrate stand-pivot transfer bed to w/c req mod A.  PT Short Term Goal 3 - Progress (Week 1): Discontinued (comment) (Focusing on squat pivot transfers) PT Short Term Goal 4 (Week 1): Pt will self propel manual w/c x 50' req min A PT Short Term Goal 4 - Progress (Week 1): Revised due to lack of progress PT Short Term Goal 5 (Week 1): Pt will tolerate ambulation with 2 person assist.  PT Short Term Goal 5 - Progress (Week 1): Met  Skilled Therapeutic Interventions/Progress Updates:  1:1. Pt received sitting in w/c, ready for therapy. Focus this session on w/c propulsion, transfers and NMR. Emphasis on w/c propulsion using hemi-technique with R UE/LE 200'x1 and 175'x1 with mod A overall. Pt req mod multimodal cues for technique and sequencing especially with turns. Pt req max A for squat pivot t/f to L w/c>tx mat, but mod A to R tx mat>w/c with mod cues for timing and use of bobath technique due to trunk extension.   Sitting EOM, pt engaged in reaching for horseshoes on blue bench to facilitate anterior weight shift, trunk control and increased WB through B LE, then tossing at target on L side to facilitate B weight shifting, trunk rotation, postural control and balance reactions. Pt req min A for balance during task with therapist facilitating increased WB through L UE and increased trunk rotation to L throughout.  In hall, emphasis on t/f sit<>stand x6 reps w/ use of  rail for R UE support to target timing, seq and coordination in L LE with initial max A decreasing to mod A. Verbal cues for pt to locate target on R side for improved ability to self-correct posture to midline position. Therapist manually facilitating increased B hip ext, L knee ext and forward rotation of L hip.  Pt left sitting in w/c at end of session w/ all needs in reach, quick release belt in place.   Therapy Documentation Precautions:  Precautions Precautions: Fall Restrictions Weight Bearing Restrictions: No Pain: Pain Assessment Pain Assessment: No/denies pain  See FIM for current functional status  Therapy/Group: Individual Therapy  Gilmore Laroche 07/05/2014, 12:14 PM

## 2014-07-05 NOTE — Progress Notes (Signed)
Social Work Patient ID: [name], male   DOB: 05/08/1931, 80 y.o.   MRN: [id number]  CSW attempted to meet with pt multiple times, without success, to update him on team conference discussion.  CSW did talk with son via telephone to update him and he continues to feel SNF is the best d/c plan for pt, as it will allow pt to get stronger and will allow family time to prepare for pt's care at home.  CSW explained that pt still requires 2 people to get him up and that the goal is to get pt to a 1 person transfer and then he will be ready for transfer to SNF.  Son has talked with pt some about SNF.  CSW will meet with pt at the beginning of the week to discuss SNF options and then CSW will begin the SNF process.  Pt's son has seen improvement in pt's speech and was pleased about that.  He recognizes that the physical rehab will take more time.  CSW to continue to follow and assist.

## 2014-07-05 NOTE — Progress Notes (Signed)
Subjective/Complaints: No pain c/os  Review of Systems - Negative except weak on left  Objective: Vital Signs: Blood pressure 138/58, pulse 52, temperature 98.5 F (36.9 C), temperature source Oral, resp. rate 18, weight 89.631 kg (197 lb 9.6 oz), SpO2 99.00%. No results found. Results for orders placed during the hospital encounter of 06/14/14 (from the past 72 hour(s))  GLUCOSE, CAPILLARY     Status: Abnormal   Collection Time    07/02/14 12:08 PM      Result Value Ref Range   Glucose-Capillary 147 (*) 70 - 99 mg/dL   Comment 1 Notify RN    GLUCOSE, CAPILLARY     Status: Abnormal   Collection Time    07/02/14  4:21 PM      Result Value Ref Range   Glucose-Capillary 191 (*) 70 - 99 mg/dL  GLUCOSE, CAPILLARY     Status: Abnormal   Collection Time    07/02/14  9:23 PM      Result Value Ref Range   Glucose-Capillary 149 (*) 70 - 99 mg/dL  GLUCOSE, CAPILLARY     Status: None   Collection Time    07/03/14  7:14 AM      Result Value Ref Range   Glucose-Capillary 99  70 - 99 mg/dL   Comment 1 Notify RN    GLUCOSE, CAPILLARY     Status: Abnormal   Collection Time    07/03/14 11:54 AM      Result Value Ref Range   Glucose-Capillary 152 (*) 70 - 99 mg/dL   Comment 1 Notify RN    GLUCOSE, CAPILLARY     Status: Abnormal   Collection Time    07/03/14  4:25 PM      Result Value Ref Range   Glucose-Capillary 198 (*) 70 - 99 mg/dL  GLUCOSE, CAPILLARY     Status: Abnormal   Collection Time    07/03/14  9:22 PM      Result Value Ref Range   Glucose-Capillary 120 (*) 70 - 99 mg/dL  GLUCOSE, CAPILLARY     Status: Abnormal   Collection Time    07/04/14  6:36 AM      Result Value Ref Range   Glucose-Capillary 124 (*) 70 - 99 mg/dL  GLUCOSE, CAPILLARY     Status: Abnormal   Collection Time    07/04/14 11:26 AM      Result Value Ref Range   Glucose-Capillary 112 (*) 70 - 99 mg/dL   Comment 1 Notify RN    GLUCOSE, CAPILLARY     Status: Abnormal   Collection Time   07/04/14  4:31 PM      Result Value Ref Range   Glucose-Capillary 161 (*) 70 - 99 mg/dL  BASIC METABOLIC PANEL     Status: Abnormal   Collection Time    07/04/14  8:00 PM      Result Value Ref Range   Sodium 134 (*) 137 - 147 mEq/L   Potassium 6.1 (*) 3.7 - 5.3 mEq/L   Chloride 101  96 - 112 mEq/L   CO2 20  19 - 32 mEq/L   Glucose, Bld 148 (*) 70 - 99 mg/dL   BUN 50 (*) 6 - 23 mg/dL   Creatinine, Ser 3.64 (*) 0.50 - 1.35 mg/dL   Calcium 10.1  8.4 - 10.5 mg/dL   GFR calc non Af Amer 14 (*) >90 mL/min   GFR calc Af Amer 17 (*) >90 mL/min   Comment: (NOTE)  The eGFR has been calculated using the CKD EPI equation.     This calculation has not been validated in all clinical situations.     eGFR's persistently <90 mL/min signify possible Chronic Kidney     Disease.   Anion gap 13  5 - 15  GLUCOSE, CAPILLARY     Status: Abnormal   Collection Time    07/04/14 10:01 PM      Result Value Ref Range   Glucose-Capillary 121 (*) 70 - 99 mg/dL  BASIC METABOLIC PANEL     Status: Abnormal   Collection Time    07/05/14  5:21 AM      Result Value Ref Range   Sodium 136 (*) 137 - 147 mEq/L   Potassium 5.6 (*) 3.7 - 5.3 mEq/L   Chloride 104  96 - 112 mEq/L   CO2 19  19 - 32 mEq/L   Glucose, Bld 106 (*) 70 - 99 mg/dL   BUN 48 (*) 6 - 23 mg/dL   Creatinine, Ser 3.63 (*) 0.50 - 1.35 mg/dL   Calcium 10.1  8.4 - 10.5 mg/dL   GFR calc non Af Amer 14 (*) >90 mL/min   GFR calc Af Amer 17 (*) >90 mL/min   Comment: (NOTE)     The eGFR has been calculated using the CKD EPI equation.     This calculation has not been validated in all clinical situations.     eGFR's persistently <90 mL/min signify possible Chronic Kidney     Disease.   Anion gap 13  5 - 15  GLUCOSE, CAPILLARY     Status: Abnormal   Collection Time    07/05/14  7:11 AM      Result Value Ref Range   Glucose-Capillary 110 (*) 70 - 99 mg/dL     HEENT: normal Cardio: RRR and no murmur Resp: CTA B/L and unlabored GI: BS positive  and NT, ND Extremity:  No Edema Skin:   Other Left transerve healed breast incision at nipple level, left upper back incion well healed, dry skin both feet without open areas Neuro: Lethargic, Flat, Cranial Nerve Abnormalities Left central 7, Abnormal Sensory decreased in both feet, Abnormal Motor bilateral hand intrinsic atrophy and  motor tr Left bi, tri grip, 3- Left HF, KE , 2- ankle DF Musc/Skel:  Other no pain with AROM in BUE and BLE, or with neck movement, sitting balance fair minus Left shoulder sublux Gen NAD    Assessment/Plan: 1. Functional deficits secondary to RIght pontomedullary hemorrhage with Left hemiparesis superimposed on severe diabetic neuropathy which require 3+ hours per day of interdisciplinary therapy in a comprehensive inpatient rehab setting. Physiatrist is providing close team supervision and 24 hour management of active medical problems listed below. Physiatrist and rehab team continue to assess barriers to discharge/monitor patient progress toward functional and medical goals.   FIM: FIM - Bathing Bathing Steps Patient Completed: Chest;Left Arm;Abdomen;Front perineal area;Right upper leg;Left upper leg Bathing: 3: Mod-Patient completes 5-7 71f 10 parts or 50-74%  FIM - Upper Body Dressing/Undressing Upper body dressing/undressing steps patient completed: Pull shirt over trunk Upper body dressing/undressing: 0: Wears gown/pajamas-no public clothing FIM - Lower Body Dressing/Undressing Lower body dressing/undressing steps patient completed: Thread/unthread left pants leg Lower body dressing/undressing: 0: Wears Interior and spatial designer  FIM - Musician Devices: Grab bar or rail for support Toileting: 1: Total-Patient completed zero steps, helper did all 3  FIM - Radio producer Devices: Bedside commode;Grab bars Toilet  Transfers: 2-To toilet/BSC: Max A (lift and lower assist);1-From toilet/BSC: Total  A (helper does all/Pt. < 25%);1-Two helpers  FIM - Control and instrumentation engineer Devices: Arm rests Bed/Chair Transfer: 5: Supine > Sit: Supervision (verbal cues/safety issues);3: Sit > Supine: Mod A (lifting assist/Pt. 50-74%/lift 2 legs)  FIM - Locomotion: Wheelchair Distance: 75 Locomotion: Wheelchair: 2: Travels 50 - 149 ft with maximal assistance (Pt: 25 - 49%) FIM - Locomotion: Ambulation Locomotion: Ambulation Assistive Devices: Ethelene Hal Ambulation/Gait Assistance: 1: +2 Total assist;3: Mod assist Locomotion: Ambulation: 0: Activity did not occur  Comprehension Comprehension Mode: Auditory Comprehension: 5-Understands basic 90% of the time/requires cueing < 10% of the time  Expression Expression Mode: Verbal Expression: 4-Expresses basic 75 - 89% of the time/requires cueing 10 - 24% of the time. Needs helper to occlude trach/needs to repeat words.  Social Interaction Social Interaction: 4-Interacts appropriately 75 - 89% of the time - Needs redirection for appropriate language or to initiate interaction.  Problem Solving Problem Solving: 3-Solves basic 50 - 74% of the time/requires cueing 25 - 49% of the time  Memory Memory: 2-Recognizes or recalls 25 - 49% of the time/requires cueing 51 - 75% of the time  Medical Problem List and Plan:  1. 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 Latest cranial CT scan stable  2. DVT Prophylaxis/Anticoagulation: Subcutaneous heparin initiated for DVT prophylaxis 06/13/2014  3. Pain Management: Tylenol as needed. Monitor the increased mobility  4. Diabetes mellitus with peripheral neuropathy. Latest hemoglobin A1c 6.7. Check blood sugars a.c. and at bedtime.Tradjenta 5 mg daily.Just on Trajenta now, Patient on Levemir 15 units each bedtime and Onglyza 2.5 mg daily prior to admission.    -consider resuming levemir if sugars remain elevated 5. Neuropsych: This  patient is not capable of making decisions on his own behalf.  6. Skin/Wound Care: Routine skin checks  7. Chronic renal insufficiency. Baseline creatinine 2.84. Followup chemistries show stable GFR ~20  -Cr up to >3  -monitor wts  -off IVF, lasix on hold, monitor  -continue to encourage fluids    -recheck bmet tomorrow 8. Chronic diastolic congestive heart failure. Monitor for any signs of fluid overload. Patient on Lasix 80 mg Daily.(held given #7) no peripheral edema 9. Hypertension. Norvasc 10 mg daily, Coreg 6.25 mg twice a day. Monitor with increased mobility  10. CAD with history of CABG. No chest pain or shortness of breath  11. Hyperlipidemia. Lipitor  12. GERD. Protonix   LOS (Days) 21 A FACE TO FACE EVALUATION WAS PERFORMED   Saran Laviolette E 07/05/2014, 7:54 AM

## 2014-07-05 NOTE — Progress Notes (Signed)
Critical Potassium value for 07/04/2014, 2000: 6.1 mEq/L. Reesa Chew, PA. made aware. Kayexalate 30g given as per order. K+ put on hold, BMET done this am. K+ value for 07/05/2014, 0521: 5.6 mEq/L. Report given to on coming nurse. Gavina Dildine A. [place], RN.

## 2014-07-05 NOTE — Progress Notes (Signed)
Occupational Therapy Note  Patient Details  Name: [name] MRN: [id number] Date of Birth: August 04, 1931  Today's Date: 07/05/2014 OT Individual Time: 1300-1330 OT Individual Time Calculation (min): 30 min   Pt denied pain Individual Therapy  Pt sitting in w/c eating lunch upon arrival.  Focus on use of LUE for self feeding requiring HOH assist for control.  Focus on LUE NMR and use of LUE for other functional tasks while seated in w/c.  Pt eager to participate.  Pt remained in w/c with all needs within reach.   Leotis Shames [facility] 07/05/2014, 2:46 PM

## 2014-07-05 NOTE — Patient Care Conference (Signed)
Inpatient RehabilitationTeam Conference and Plan of Care Update Date: 07/03/2014   Time: 10:45 AM    Patient Name: [name]      Medical Record Number: [id number]  Date of Birth: 03/26/1931 Sex: Male         Room/Bed: 4W23C/4W23C-01 Payor Info: Payor: [employer] MEDICARE / Plan: [place] [employer] MEDICARE HMO/PPO / Product Type: *No Product type* /    Admitting Diagnosis: R CVA  Admit Date/Time:  06/14/2014  5:42 PM Admission Comments: No comment available   Primary Diagnosis:  <principal problem not specified> Principal Problem: <principal problem not specified>  Patient Active Problem List   Diagnosis Date Noted  . Spastic hemiparesis affecting nondominant side 07/02/2014  . ICH (intracerebral hemorrhage) 06/14/2014  . Stroke due to intracerebral hemorrhage 06/11/2014  . Atrial fibrillation 11/20/2013  . Chronic diastolic congestive heart failure 11/20/2013  . Atrial flutter 06/25/2013  . Long term (current) use of anticoagulants 06/25/2013  . CKD (chronic kidney disease) stage 5, GFR less than 15 ml/min 08/29/2012  . Pulmonary arterial hypertension 07/29/2012  . Anemia, iron deficiency 01/25/2012  . AV block, Mobitz 1 01/10/2012  . CARDIOMYOPATHY, ISCHEMIC 10/22/2010  . HEADACHE 01/23/2009  . DIABETIC PERIPHERAL NEUROPATHY 07/24/2008  . ABDOMINAL PAIN, RIGHT UPPER QUADRANT 10/23/2007  . DIABETES MELLITUS 06/24/2007  . HYPERLIPIDEMIA 06/24/2007  . CORONARY ARTERY DISEASE 06/24/2007  . OSTEOARTHRITIS 06/24/2007  . CEREBROVASCULAR DISEASE, HX OF 06/24/2007  . STATUS, OTHER TOE(S) AMPUTATION 06/24/2007  . HYPERTENSION 05/13/2007  . CORONARY ARTERY BYPASS GRAFT, HX OF 11/25/1994    Expected Discharge Date: Expected Discharge Date: 07/12/14  Team Members Present:       Current Status/Progress Goal Weekly Team Focus  Medical   timed toileting is inconsistent  bowel cont, one person assist  coordinate therapy and RN staff for toileting prog   Bowel/Bladder   incontinent of bowel  and bladder at times  mod assist for toiletng, no condom cath  Continue timed toiletingno complaintsd of pain    Swallow/Nutrition/ Hydration   intermittent supervision, regular/thin   mod I with least restrictive diet   continued diet tolerance    ADL's   Min assist for sitting at EOB, total assist overall, +2 for transfers and LB dressing at sit > stand level  min assist overall  bed mobility, Lt NMR, sit <> stand, transfers, attention to tasks   Mobility   Supervision-Min A bed mobility, Mod A to +2A transfers, Min A w/c mobility  Min A overall with exception of Mod A gait  Lateral and sit<>stand transfers, bed mobility, standing, decreasing pushing, NMR   Communication   min assist for use of dysarthria strategies at the phrase level   supervision   carryover of dysarthria strategies in conversation    Safety/Cognition/ Behavioral Observations  min-mod assist   supervision   memory, self monitoring, attention, awareness, problem solving    Pain   no complaints of pain today, tylenol 650mg  q4 prn   pain equal to  or less than 3 on a scale of 0-10  assess pain q shift and prn   Skin   skin tears to scrotum healing, Deep tissue injury to L heel, dry feet, eucerin applied  no further skin injury/breakdown  assess skin condition q shift and prn    Rehab Goals Patient on target to meet rehab goals: Yes Rehab Goals Revised: none *See Care Plan and progress notes for long and short-term goals.  Barriers to Discharge: heavy assist    Possible Resolutions  to Barriers:  ?SNF    Discharge Planning/Teaching Needs:  Pt is continuing to become more aware of his physical needs and limitations and the amount of care he will need after he leaves CIR.  He is considering SNF care now.   If pt's familly/friends decide to provide 24/7 care, team will provide education as needed.   Team Discussion:  Pt is doing well from a medical standpoint.  He has a shoulder subluxation, but is working through  it.  Pt is still incontinent, but this may be due to inconsistency in timed toileting.  Nursing to continue to work on this, as pt does well when it is in place.  OT has begun using the bedside commode.  PT would like to get pt to a place where he only needs 1 person for transfers.  Pt is unsteady and nursing reports he has been trying to get up a lot.  Pt likes his quiet time, as reported by SLP.  Pt began to refuse heparin while SLP was present, but once its importance was explained, pt accepted the heparin.    Revisions to Treatment Plan:  None   Continued Need for Acute Rehabilitation Level of Care: The patient requires daily medical management by a physician with specialized training in physical medicine and rehabilitation for the following conditions: Daily direction of a multidisciplinary physical rehabilitation program to ensure safe treatment while eliciting the highest outcome that is of practical value to the patient.: Yes Daily medical management of patient stability for increased activity during participation in an intensive rehabilitation regime.: Yes Daily analysis of laboratory values and/or radiology reports with any subsequent need for medication adjustment of medical intervention for : Neurological problems  Greydis Stlouis, Silvestre Mesi 07/05/2014, 12:22 AM

## 2014-07-05 NOTE — Progress Notes (Signed)
I have reviewed and agree with the attached treatment note.  Gunda Maqueda, OTR/L 

## 2014-07-05 NOTE — Progress Notes (Signed)
Speech Language Pathology Daily Session Note  Patient Details  Name: [name] MRN: [id number] Date of Birth: 03-07-1931  Today's Date: 07/05/2014 SLP Individual Time: 3220-2542 SLP Individual Time Calculation (min): 45 min  Short Term Goals: Week 3: SLP Short Term Goal 1 (Week 3): Patient will utilize speech strategies (slow down, over-articulate, pause between words) to achieve >80% intelligibility at phrase level, with supervision cues. SLP Short Term Goal 2 (Week 3): Patient will demonstrate intellectual awareness by identifying at least 1 speech and 1 physical deficit, with min assist. SLP Short Term Goal 3 (Week 3): Pt will use environmental aids to facilitate improved recall of daily information for 75% accuracy wtih mod assist  SLP Short Term Goal 4 (Week 3): Patient will solve basic level functional problems wtih 80% accuracy with supervision. SLP Short Term Goal 5 (Week 3): Pt will return demonstration of oral motor exercises to improve left sided weakness.    Skilled Therapeutic Interventions:  Pt was seen for skilled speech therapy targeting cognitive goals.  SLP facilitated the session by continuing visuospatial/constructional problem solving task from yesterday's therapy session.  Pt recalled activity from yesterday with supervision question cues.  Pt required overall min assist cues for planning and error awareness as well as extra processing time to complete the abovementioned task.  SLP reoriented pt to daily schedule with min verbal cues to maximize functional independence for recall of daily information.  Continue per current plan of care.   FIM:  Comprehension Comprehension Mode: Auditory Comprehension: 5-Follows basic conversation/direction: With extra time/assistive device Expression Expression Mode: Verbal Expression: 4-Expresses basic 75 - 89% of the time/requires cueing 10 - 24% of the time. Needs helper to occlude trach/needs to repeat words. Social  Interaction Social Interaction: 4-Interacts appropriately 75 - 89% of the time - Needs redirection for appropriate language or to initiate interaction. Problem Solving Problem Solving: 4-Solves basic 75 - 89% of the time/requires cueing 10 - 24% of the time Memory Memory: 3-Recognizes or recalls 50 - 74% of the time/requires cueing 25 - 49% of the time  Pain Pain Assessment Pain Assessment: No/denies pain  Therapy/Group: Individual Therapy  [name], M.A. CCC-SLP  Syrina Wake, Selinda Orion 07/05/2014, 10:28 AM

## 2014-07-06 ENCOUNTER — Inpatient Hospital Stay (HOSPITAL_COMMUNITY): Payer: Medicare HMO | Admitting: Physical Therapy

## 2014-07-06 DIAGNOSIS — I272 Other secondary pulmonary hypertension: Secondary | ICD-10-CM

## 2014-07-06 DIAGNOSIS — Z7901 Long term (current) use of anticoagulants: Secondary | ICD-10-CM

## 2014-07-06 LAB — GLUCOSE, CAPILLARY
Glucose-Capillary: 96 mg/dL (ref 70–99)
Glucose-Capillary: 122 mg/dL — ABNORMAL HIGH (ref 70–99)
Glucose-Capillary: 123 mg/dL — ABNORMAL HIGH (ref 70–99)
Glucose-Capillary: 112 mg/dL — ABNORMAL HIGH (ref 70–99)

## 2014-07-06 LAB — BASIC METABOLIC PANEL
Anion gap: 17 — ABNORMAL HIGH (ref 5–15)
BUN: 43 mg/dL — AB (ref 6–23)
CHLORIDE: 104 meq/L (ref 96–112)
CO2: 20 mEq/L (ref 19–32)
Calcium: 9.9 mg/dL (ref 8.4–10.5)
Creatinine, Ser: 3.29 mg/dL — ABNORMAL HIGH (ref 0.50–1.35)
GFR calc non Af Amer: 16 mL/min — ABNORMAL LOW (ref >90)
GFR, EST AFRICAN AMERICAN: 19 mL/min — AB (ref >90)
GLUCOSE: 105 mg/dL — AB (ref 70–99)
POTASSIUM: 4.1 meq/L (ref 3.7–5.3)
Sodium: 141 mEq/L (ref 137–147)

## 2014-07-06 NOTE — Progress Notes (Signed)
Physical Therapy Session Note  Patient Details  Name: [name] MRN: [id number] Date of Birth: April 05, 1931  Today's Date: 07/06/2014 PT Individual Time: 1305-1356 PT Individual Time Calculation (min): 51 min   Short Term Goals: Week 3:  PT Short Term Goal 1 (Week 3): Pt will consistently perform sit>supine with min A with HOB flat with 25% cueing for technique. PT Short Term Goal 2 (Week 3): Pt will consistently transfer from bed<>w/c to R and L sides with mod A and 50% cueing for technique. PT Short Term Goal 3 (Week 3): Pt will consistently perform sit<>stand with mod A and 50% cueing for technique. PT Short Term Goal 4 (Week 3): Pt will perform gait x10' in controlled environment with Total A of single therapist. PT Short Term Goal 5 (Week 3): Pt will perform w/c mobility x100' in controlled environment with min A and 25% cueing.  Skilled Therapeutic Interventions/Progress Updates:    Pt received seated in w/c having just finished lunch; agreeable to therapy. Session focused on increasing pt independence with w/c mobility and functional transfers. Pt performed w/c mobility x75' in controlled environment with R one-arm drive w/c requiring min A, increased time and 50% cueing for technique (focus on turning to L side). Pt with improved ability to utilize only outer hand rim. Performed functional transfers x3 total with focus on pt initiation increasing consistency of transfer across care team. Pt performed squat pivot transfer from w/c>bed (to R side) with mod A and multimodal cueing for full anterior weight shift and setup, cueing for open R hand on bed surface to prevent pushing/pulling. Performed lateral scooting transfers from bed<>w/c with max A, multimodal cueing for pt actively performing lifting/lowering as opposed to pushing posteriorly when provided with tactile cueing at posterior ribcage. Performed sit>supine with mod A for bilat LE management, verbal/demonstration cueing for  technique. Departed with pt semi reclined in bed with 3 rails up, bed alarm on, and all needs within reach.  Therapy Documentation Precautions:  Precautions Precautions: Fall Restrictions Weight Bearing Restrictions: No Pain:  Pt reports no pain during PT session.  See FIM for current functional status  Therapy/Group: Individual Therapy  Hobble, Malva Cogan 07/06/2014, 6:32 PM

## 2014-07-06 NOTE — Progress Notes (Signed)
[name] is a 80 y.o. male 1931-02-23 [id number]  Subjective: No new complaints. No new problems. Slept well. Feeling OK.  Objective: Vital signs in last 24 hours: Temp:  [98.1 F (36.7 C)-98.7 F (37.1 C)] 98.7 F (37.1 C) (10/10 0540) Pulse Rate:  [54-65] 54 (10/10 0540) Resp:  [17-18] 18 (10/10 0540) BP: (146-148)/(58-76) 148/58 mmHg (10/10 0540) SpO2:  [96 %-97 %] 97 % (10/10 0540) Weight change:  Last BM Date: 07/03/14  Intake/Output from previous day: 10/09 0701 - 10/10 0700 In: 360 [P.O.:360] Out: 250 [Urine:250] Last cbgs: CBG (last 3)   Recent Labs  07/05/14 1634 07/05/14 2053 07/06/14 0621  GLUCAP 121* 100* 96     Physical Exam General: No apparent distress   HEENT: not dry Lungs: Normal effort. Lungs clear to auscultation, no crackles or wheezes. Cardiovascular: Regular rate and rhythm, no edema Abdomen: S/NT/ND; BS(+) Musculoskeletal:  unchanged Neurological: No new neurological deficits Wounds: N/A    Skin: clear  Aging changes Mental state: Alert, oriented, cooperative    Lab Results: BMET    Component Value Date/Time   NA 141 07/06/2014 0305   K 4.1 07/06/2014 0305   CL 104 07/06/2014 0305   CO2 20 07/06/2014 0305   GLUCOSE 105* 07/06/2014 0305   BUN 43* 07/06/2014 0305   CREATININE 3.29* 07/06/2014 0305   CREATININE 1.89* 12/20/2011 1233   CALCIUM 9.9 07/06/2014 0305   GFRNONAA 16* 07/06/2014 0305   GFRAA 19* 07/06/2014 0305   CBC    Component Value Date/Time   WBC 4.9 06/17/2014 1210   RBC 3.80* 06/17/2014 1210   HGB 10.7* 06/17/2014 1210   HCT 32.0* 06/17/2014 1210   PLT 97* 06/17/2014 1210   MCV 84.2 06/17/2014 1210   MCH 28.2 06/17/2014 1210   MCHC 33.4 06/17/2014 1210   RDW 17.0* 06/17/2014 1210   LYMPHSABS 0.9 06/17/2014 1210   MONOABS 0.4 06/17/2014 1210   EOSABS 0.0 06/17/2014 1210   BASOSABS 0.0 06/17/2014 1210    Studies/Results: No results found.  Medications: I have reviewed the patient's current  medications.  Assessment/Plan: 1. 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 Latest cranial CT scan stable  2. DVT Prophylaxis/Anticoagulation: Subcutaneous heparin initiated for DVT prophylaxis 06/13/2014  3. Pain Management: Tylenol as needed. Monitor the increased mobility  4. Diabetes mellitus with peripheral neuropathy. Latest hemoglobin A1c 6.7. Check blood sugars a.c. and at bedtime.Tradjenta 5 mg daily.Just on Trajenta now, Patient on Levemir 15 units each bedtime and Onglyza 2.5 mg daily prior to admission.  -consider resuming levemir if sugars remain elevated  5. Neuropsych: This patient is not capable of making decisions on his own behalf.  6. Skin/Wound Care: Routine skin checks  7. Chronic renal insufficiency. Baseline creatinine 2.84. Followup chemistries show stable GFR ~20  -Cr up to >3  -monitor wts  -off IVF, lasix on hold, monitor  -continue to encourage fluids  -recheck bmet tomorrow  8. Chronic diastolic congestive heart failure. Monitor for any signs of fluid overload. Patient on Lasix 80 mg Daily.(held given #7) no peripheral edema  9. Hypertension. Norvasc 10 mg daily, Coreg 6.25 mg twice a day. Monitor with increased mobility  10. CAD with history of CABG. No chest pain or shortness of breath  11. Hyperlipidemia. Lipitor  12. GERD. Protonix  Length of stay, days: 32  Walker Kehr , MD 07/06/2014, 9:11 AM

## 2014-07-07 ENCOUNTER — Inpatient Hospital Stay (HOSPITAL_COMMUNITY): Payer: Medicare HMO

## 2014-07-07 LAB — BASIC METABOLIC PANEL
Anion gap: 17 — ABNORMAL HIGH (ref 5–15)
BUN: 43 mg/dL — AB (ref 6–23)
CHLORIDE: 101 meq/L (ref 96–112)
CO2: 20 meq/L (ref 19–32)
CREATININE: 3.14 mg/dL — AB (ref 0.50–1.35)
Calcium: 9.6 mg/dL (ref 8.4–10.5)
GFR calc Af Amer: 20 mL/min — ABNORMAL LOW (ref >90)
GFR calc non Af Amer: 17 mL/min — ABNORMAL LOW (ref >90)
GLUCOSE: 90 mg/dL (ref 70–99)
Potassium: 3.9 mEq/L (ref 3.7–5.3)
Sodium: 138 mEq/L (ref 137–147)

## 2014-07-07 LAB — GLUCOSE, CAPILLARY
GLUCOSE-CAPILLARY: 93 mg/dL (ref 70–99)
GLUCOSE-CAPILLARY: 137 mg/dL — AB (ref 70–99)
GLUCOSE-CAPILLARY: 128 mg/dL — AB (ref 70–99)
Glucose-Capillary: 135 mg/dL — ABNORMAL HIGH (ref 70–99)

## 2014-07-07 NOTE — Progress Notes (Signed)
Bilateral heels with blistered areas. Left PRAFO boot applied at HS, elevated bilateral heels off bed. No void times 12 hours, patient refused to be cathed. " I am not going to be cathed!!" Voided 450 into condom cath. [name] A

## 2014-07-07 NOTE — Progress Notes (Signed)
Physical Therapy Session Note  Patient Details  Name: [name] MRN: [id number] Date of Birth: 12-01-1930  Today's Date: 07/07/2014 PT Individual Time: 1445-1520 PT Individual Time Calculation (min): 35 min   Short Term Goals: Week 3:  PT Short Term Goal 1 (Week 3): Pt will consistently perform sit>supine with min A with HOB flat with 25% cueing for technique. PT Short Term Goal 2 (Week 3): Pt will consistently transfer from bed<>w/c to R and L sides with mod A and 50% cueing for technique. PT Short Term Goal 3 (Week 3): Pt will consistently perform sit<>stand with mod A and 50% cueing for technique. PT Short Term Goal 4 (Week 3): Pt will perform gait x10' in controlled environment with Total A of single therapist. PT Short Term Goal 5 (Week 3): Pt will perform w/c mobility x100' in controlled environment with min A and 25% cueing.  Skilled Therapeutic Interventions/Progress Updates:    Pt received supine in bed, agreeable to participate in therapy. Pt's NT reports he had been up sitting in w/c for most of day and had recently requested to get back in bed. With mod encouragement pt agreeable to work on sliding board transfer training and bed level exercises. Pt moved supine<>sit w/ ModA for managing BLE. Sliding board transfer bed>w/c to L w/ modA, max VC's for sequencing, hand placement, weight shift. SBT w/c>bed to R w/ MaxA, increased difficulty going this way due to pushing and decreased use of LUE. Seated EOB pt performed alternating LAQ for LE coordination, pt verbalized enjoyment of leg exercises. Additionally worked on forward leans w/ elbows on knees to acclimate pt to anterior weight shift for use during transfers. Pt transferred back to supine, ModA to move up in bed w/ maxVC's for sequencing and use of Trendelenburg feature. Pt left supine in bed w/ all needs within reach.   Therapy Documentation Precautions:  Precautions Precautions: Fall Restrictions Weight Bearing  Restrictions: No General:   Vital Signs: Therapy Vitals Temp: 98.5 F (36.9 C) Temp Source: Oral Pulse Rate: 57 Resp: 16 BP: 149/68 mmHg Oxygen Therapy SpO2: 94 % O2 Device: None (Room air) Pain:  Denied pain. Mobility:   Locomotion :    Trunk/Postural Assessment :    Balance:   Exercises:   Other Treatments:    See FIM for current functional status  Therapy/Group: Individual Therapy  Rada Hay Rada Hay, PT, DPT 07/07/2014, 7:53 AM

## 2014-07-07 NOTE — Progress Notes (Signed)
[name] is a 80 y.o. male 12/27/1930 [id number]  Subjective: No new complaints. Slept well. Feeling OK.  Objective: Vital signs in last 24 hours: Temp:  [98.2 F (36.8 C)-98.5 F (36.9 C)] 98.5 F (36.9 C) (10/11 0525) Pulse Rate:  [56-60] 57 (10/11 0525) Resp:  [16-18] 16 (10/11 0525) BP: (135-155)/(55-68) 149/68 mmHg (10/11 0525) SpO2:  [94 %-99 %] 94 % (10/11 0525) Weight change:  Last BM Date: 07/05/14  Intake/Output from previous day: 10/10 0701 - 10/11 0700 In: 720 [P.O.:720] Out: 650 [Urine:650] Last cbgs: CBG (last 3)   Recent Labs  07/06/14 1629 07/06/14 2036 07/07/14 0622  GLUCAP 123* 112* 93     Physical Exam General: No apparent distress   HEENT: not dry Lungs: Normal effort. Lungs clear to auscultation, no crackles or wheezes. Cardiovascular: Regular rate and rhythm, no edema Abdomen: S/NT/ND; BS(+) Musculoskeletal:  unchanged Neurological: No new neurological deficits Wounds: N/A    Skin: clear  Aging changes Mental state: Alert, cooperative    Lab Results: BMET    Component Value Date/Time   NA 138 07/07/2014 0513   K 3.9 07/07/2014 0513   CL 101 07/07/2014 0513   CO2 20 07/07/2014 0513   GLUCOSE 90 07/07/2014 0513   BUN 43* 07/07/2014 0513   CREATININE 3.14* 07/07/2014 0513   CREATININE 1.89* 12/20/2011 1233   CALCIUM 9.6 07/07/2014 0513   GFRNONAA 17* 07/07/2014 0513   GFRAA 20* 07/07/2014 0513   CBC    Component Value Date/Time   WBC 4.9 06/17/2014 1210   RBC 3.80* 06/17/2014 1210   HGB 10.7* 06/17/2014 1210   HCT 32.0* 06/17/2014 1210   PLT 97* 06/17/2014 1210   MCV 84.2 06/17/2014 1210   MCH 28.2 06/17/2014 1210   MCHC 33.4 06/17/2014 1210   RDW 17.0* 06/17/2014 1210   LYMPHSABS 0.9 06/17/2014 1210   MONOABS 0.4 06/17/2014 1210   EOSABS 0.0 06/17/2014 1210   BASOSABS 0.0 06/17/2014 1210    Studies/Results: No results found.  Medications: I have reviewed the patient's current medications.  Assessment/Plan: 1.  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 Latest cranial CT scan stable  2. DVT Prophylaxis/Anticoagulation: Subcutaneous heparin initiated for DVT prophylaxis 06/13/2014  3. Pain Management: Tylenol as needed. Monitor the increased mobility  4. Diabetes mellitus with peripheral neuropathy. Latest hemoglobin A1c 6.7. Check blood sugars a.c. and at bedtime.Tradjenta 5 mg daily.Just on Trajenta now, Patient on Levemir 15 units each bedtime and Onglyza 2.5 mg daily prior to admission.  -consider resuming levemir if sugars remain elevated  5. Neuropsych: This patient is not capable of making decisions on his own behalf.  6. Skin/Wound Care: Routine skin checks  7. Chronic renal insufficiency. Baseline creatinine 2.84. Followup chemistries show stable GFR ~20  -Cr up to >3  -monitor wts  -off IVF, lasix on hold, monitor  -continue to encourage fluids  -recheck bmet tomorrow  8. Chronic diastolic congestive heart failure. Monitor for any signs of fluid overload. Patient on Lasix 80 mg Daily.(held given #7) no peripheral edema  9. Hypertension. Norvasc 10 mg daily, Coreg 6.25 mg twice a day. Monitor with increased mobility  10. CAD with history of CABG. No chest pain or shortness of breath  11. Hyperlipidemia. Lipitor  12. GERD. Protonix  Continue with current  therapy as reflected on the Med list.   Length of stay, days: 23  Walker Kehr , MD 07/07/2014, 8:53 AM

## 2014-07-08 ENCOUNTER — Encounter (HOSPITAL_COMMUNITY): Payer: Medicare Other

## 2014-07-08 ENCOUNTER — Inpatient Hospital Stay (HOSPITAL_COMMUNITY): Payer: Medicare HMO

## 2014-07-08 ENCOUNTER — Inpatient Hospital Stay (HOSPITAL_COMMUNITY): Payer: Medicare Other | Admitting: Speech Pathology

## 2014-07-08 ENCOUNTER — Inpatient Hospital Stay (HOSPITAL_COMMUNITY): Payer: Medicare HMO | Admitting: Physical Therapy

## 2014-07-08 LAB — BASIC METABOLIC PANEL
ANION GAP: 14 (ref 5–15)
BUN: 40 mg/dL — ABNORMAL HIGH (ref 6–23)
CHLORIDE: 103 meq/L (ref 96–112)
CO2: 22 meq/L (ref 19–32)
CREATININE: 2.78 mg/dL — AB (ref 0.50–1.35)
Calcium: 9.8 mg/dL (ref 8.4–10.5)
GFR calc non Af Amer: 20 mL/min — ABNORMAL LOW (ref >90)
GFR, EST AFRICAN AMERICAN: 23 mL/min — AB (ref >90)
Glucose, Bld: 105 mg/dL — ABNORMAL HIGH (ref 70–99)
POTASSIUM: 4.1 meq/L (ref 3.7–5.3)
Sodium: 139 mEq/L (ref 137–147)

## 2014-07-08 LAB — GLUCOSE, CAPILLARY
GLUCOSE-CAPILLARY: 109 mg/dL — AB (ref 70–99)
GLUCOSE-CAPILLARY: 123 mg/dL — AB (ref 70–99)
Glucose-Capillary: 163 mg/dL — ABNORMAL HIGH (ref 70–99)
Glucose-Capillary: 117 mg/dL — ABNORMAL HIGH (ref 70–99)

## 2014-07-08 NOTE — Progress Notes (Addendum)
Physical Therapy Session Note  Patient Details  Name: [name] MRN: [id number] Date of Birth: 1931/09/21  Today's Date: 07/08/2014 PT Individual Time: 5638-9373 PT Individual Time Calculation (min): 63 min   Short Term Goals: Week 3:  PT Short Term Goal 1 (Week 3): Pt will consistently perform sit>supine with min A with HOB flat with 25% cueing for technique. PT Short Term Goal 2 (Week 3): Pt will consistently transfer from bed<>w/c to R and L sides with mod A and 50% cueing for technique. PT Short Term Goal 3 (Week 3): Pt will consistently perform sit<>stand with mod A and 50% cueing for technique. PT Short Term Goal 4 (Week 3): Pt will perform gait x10' in controlled environment with Total A of single therapist. PT Short Term Goal 5 (Week 3): Pt will perform w/c mobility x100' in controlled environment with min A and 25% cueing.  Skilled Therapeutic Interventions/Progress Updates:    Pt received seated in w/c; agreeable to therapy. Sessio focused on bed mobility, basic transfers, and w/c mobility. Pt performed w/c mobility x110' in controlled environment using R one-arm drive with min A, increased time, verbal cueing for more effective use of one-arm drive component for turning (focus on turning to L) with inconsistent carryover. In treatment gym, pt performed multiple lateral scooting transfers from w/c<>mat table with hand-over-back technique requiring mod A to R side, max A to L side. Pt required 50% questioning cues for transfer setup; although pt able to verbally explain need for anterior weight shift, pt unable to functionally initiate full weight shift without tactile cueing at bilat posterior ribcage. Performed supine<>sit to L side (for increased LUE weightbearing, activation) on mat table with mod A, 75% cueing for sequencing. During supine>sit, focused on bilat UE support, tactile cueing at L ribcage to facilitate trunk lateral flexion/rotation and trunk dissociation. See below  for detailed description of NMR interventions. Session ended in pt room, where pt was left seated in w/c with quick release belt in place for safety and all needs within reach.   Therapy Documentation Precautions:  Precautions Precautions: Fall Restrictions Weight Bearing Restrictions: No Pain: Pain Assessment Pain Assessment: No/denies pain Pain Score: 0-No pain Locomotion : Wheelchair Mobility Distance: 110  NMR: Neuromuscular Facilitation: Right;Lower Extremity;Activity to increase sustained activation;Activity to increase anterior-posterior weight shifting;Activity to increase motor control;Left;Activity to increase timing and sequencing  Focused on transitional movements for motor control, grading of movement, and anterior weight shifting. Performed multiple trials of partial standing without UE support; tactile cueing at L ribcage, R posterior pelvis to emphasize erect trunk flexion; tactile cueing at L knee for increased weightbearing. Visual feedback (mirror) effective in decreasing RLE pushing during transitional movements. Progressed to bilat scooting with cueing as described above.  Addendum: Discharged goal for w/c mobility in home environment due to change in D/C destination. Downgraded assist for w/c level in controlled environment due to slow pt progress.  See FIM for current functional status  Therapy/Group: Individual Therapy  Hobble, Malva Cogan 07/08/2014, 12:29 PM

## 2014-07-08 NOTE — Progress Notes (Signed)
Physical Therapy Session Note  Patient Details  Name: [name] MRN: [id number] Date of Birth: Feb 05, 1931  Today's Date: 07/08/2014 PT Individual Time: 1440-1518 PT Individual Time Calculation (min): 38 min   Short Term Goals: Week 3:  PT Short Term Goal 1 (Week 3): Pt will consistently perform sit>supine with min A with HOB flat with 25% cueing for technique. PT Short Term Goal 2 (Week 3): Pt will consistently transfer from bed<>w/c to R and L sides with mod A and 50% cueing for technique. PT Short Term Goal 3 (Week 3): Pt will consistently perform sit<>stand with mod A and 50% cueing for technique. PT Short Term Goal 4 (Week 3): Pt will perform gait x10' in controlled environment with Total A of single therapist. PT Short Term Goal 5 (Week 3): Pt will perform w/c mobility x100' in controlled environment with min A and 25% cueing.  Skilled Therapeutic Interventions/Progress Updates:  W/c propulsion using 1 arm drive w/c x 75' with min assist occasionally due to veering L and pt's delay in detecting this.  SBT w/c > mat to R with min assist after set-up.  SBT mat> w/c to L with mod assist after set-up.  Pt improved in elevating hips when moving to the L by placing bil hands far forward, focusing on head/hips relationship,  and not pushing at all with R hand.  neuromuscular re-education while sitting EOM via demo, tactile and manual cues and demo for: -forward wt shift -L and R trunk shortening/lengthening/rotating with reaching L and R within BOS -L hand gross grasp with hand over hand assist to pick up cones and VCs for trunk movements  Upon return to room, quick release belt applied and all needs within reach.    Therapy Documentation Precautions:  Precautions Precautions: Fall Restrictions Weight Bearing Restrictions: No   Pain: Pain Assessment Pain Assessment: No/denies pain   Locomotion : Wheelchair Mobility Distance: 75  Other Treatments:  Treatments Neuromuscular Facilitation: Left;Upper Extremity;Activity to increase sustained activation;Activity to increase anterior-posterior weight shifting;Activity to increase lateral weight shifting Weight Bearing Technique LUE Weight Bearing Technique: Forearm seated  See FIM for current functional status  Therapy/Group: Individual Therapy  Primitivo Merkey 07/08/2014, 4:22 PM

## 2014-07-08 NOTE — Plan of Care (Signed)
Problem: RH Wheelchair Mobility Goal: LTG Patient will propel w/c in home environment (PT) LTG: Patient will propel wheelchair in home environment, # of feet with assistance (PT).  Outcome: Not Applicable Date Met:  93/11/21 N/A due to change in D/C destination.

## 2014-07-08 NOTE — Progress Notes (Signed)
Occupational Therapy Session Note  Patient Details  Name: [name] MRN: [id number] Date of Birth: 03/07/1931  Today's Date: 07/08/2014 OT Individual Time: 0900-1000 OT Individual Time Calculation (min): 60 min    Short Term Goals: Week 3:  OT Short Term Goal 1 (Week 3): Pt will perform LB dressing with Max A OT Short Term Goal 2 (Week 3): Pt will perform UB dressing with Min A OT Short Term Goal 3 (Week 3): Pt will perform BSC toilet transfers at [address] Term Goal 4 (Week 3): Pt will complete 1 of 3 toileting tasks  Skilled Therapeutic Interventions/Progress Updates:    Pt resting in bed upon arrival but ready to participate in therapy.  Pt engaged in BADL retraining including bathing and dressing w/c level at sink.  Pt completed LB bathing and dressing supine with HOB elevated.  Pt was able to wash periarea while supine and rolled side to side to facilitate therapist assisting with bathing buttocks.  Pt initiated threading RLE into pants and pulling up pants but required assistance to complete tasks.  Pt transferred bed->w/c with max A (Bobath method).  Pt completed UB bathing and dressing tasks seated in w/c at sink, requiring assistance with bathing LUE and donning shirt.  Pt initiated appropriate techniques but required assistance to complete.  Focus on bed mobility, activity tolerance, safety awareness, transfers, task initiation, and increased LUE use in functional tasks.  Therapy Documentation Precautions:  Precautions Precautions: Fall Restrictions Weight Bearing Restrictions: No   Pain:  Pt denied pain  See FIM for current functional status  Therapy/Group: Individual Therapy  Leroy Libman 07/08/2014, 10:01 AM

## 2014-07-08 NOTE — Progress Notes (Signed)
Social Work Patient ID: [name], male   DOB: 1930/11/20, 79 y.o.   MRN: [id number]  CSW met with pt to discuss d/c plan and update him on team conference.  CSW explained that team still feels pt will need SNF care at d/c.  Pt agreed, as he will need more care than expected.  CSW discussed SNF options and pt's preferences and will begin SNF search with week, with plan of d/c on 07-12-14.  Pt feels comfortable with this plan.

## 2014-07-08 NOTE — Progress Notes (Signed)
Speech Language Pathology Weekly Progress and Session Note  Patient Details  Name: [name] MRN: [id number] Date of Birth: Sep 13, 1931  Beginning of progress report period: July 01, 2014 End of progress report period: July 08, 2014  Today's Date: 07/08/2014 SLP Individual Time: 1300-1330 SLP Individual Time Calculation (min): 30 min  Short Term Goals: Week 3: SLP Short Term Goal 1 (Week 3): Patient will utilize speech strategies (slow down, over-articulate, pause between words) to achieve >80% intelligibility at phrase level, with supervision cues. SLP Short Term Goal 1 - Progress (Week 3): Met SLP Short Term Goal 2 (Week 3): Patient will demonstrate intellectual awareness by identifying at least 1 speech and 1 physical deficit, with min assist. SLP Short Term Goal 2 - Progress (Week 3): Progressing toward goal SLP Short Term Goal 3 (Week 3): Pt will use environmental aids to facilitate improved recall of daily information for 75% accuracy wtih mod assist  SLP Short Term Goal 3 - Progress (Week 3): Progressing toward goal SLP Short Term Goal 4 (Week 3): Patient will solve basic level functional problems wtih 80% accuracy with supervision. SLP Short Term Goal 4 - Progress (Week 3): Met SLP Short Term Goal 5 (Week 3): Pt will return demonstration of oral motor exercises to improve left sided weakness.   SLP Short Term Goal 5 - Progress (Week 3): Met    New Short Term Goals: Week 4: SLP Short Term Goal 1 (Week 4): Patient will utilize speech strategies (slow down, over-articulate, pause between words) to achieve >80% intelligibility at phrase level with mod I. SLP Short Term Goal 2 (Week 4): Patient will demonstrate intellectual awareness by identifying at least 1 speech and 1 physical deficit, with min assist. SLP Short Term Goal 3 (Week 4): Pt will use environmental aids to facilitate improved recall of daily information for 75% accuracy wtih mod assist  SLP Short Term Goal 4  (Week 4): Patient will solve semi-complex level functional problems wtih 80% accuracy with supervision.  Weekly Progress Updates:  Pt made functional gains this reporting period and has met 3 out of 5 short term goals due to improved basic problem solving, return demonstration of oral motor exercises, and use of dysarthria strategies to improve speech intelligibility at the phrase level.  Currently pt requires overall min assist for cognitive-linguistic tasks and is mod I with intermittent supervision for use of swallowing precautions to minimize overt s/s of aspiration with regular solids with thin liquids.  Pt would continue to benefit from skilled ST while inpatient in order to maximize functional independence and reduce burden of care upon discharge.  Pt education is ongoing.     Intensity: Minumum of 1-2 x/day, 30 to 90 minutes Frequency: 5 out of 7 days Duration/Length of Stay: 10-14 days Treatment/Interventions: Cognitive remediation/compensation;Speech/Language facilitation;Cueing hierarchy;Functional tasks;Internal/external aids;Patient/family education;Therapeutic Exercise   Daily Session  Skilled Therapeutic Interventions: Pt was seen for skilled speech therapy targeting cognitive goals.  Upon arrival, pt was seated upright in wheelchair, awake, alert, and agreeable to participate in ST.  Pt recalled therapeutic activity addressed in previous therapy session (10/09) with min question cues.  SLP facilitated the session with a basic problem solving task targeting reasoning and thought organization.  Pt required overall min assist to complete task to improve awareness of errors. Goals updated to reflect current progress and plan of care.        FIM:  Comprehension Comprehension Mode: Auditory Comprehension: 5-Follows basic conversation/direction: With extra time/assistive device Expression Expression Mode: Verbal  Expression: 4-Expresses basic 75 - 89% of the time/requires cueing 10 -  24% of the time. Needs helper to occlude trach/needs to repeat words. Social Interaction Social Interaction: 4-Interacts appropriately 75 - 89% of the time - Needs redirection for appropriate language or to initiate interaction. Problem Solving Problem Solving: 4-Solves basic 75 - 89% of the time/requires cueing 10 - 24% of the time Memory Memory: 3-Recognizes or recalls 50 - 74% of the time/requires cueing 25 - 49% of the time  Pain Pain Assessment Pain Assessment: No/denies pain  Therapy/Group: Individual Therapy  [name], M.A. CCC-SLP  Shaia Porath, Selinda Orion 07/08/2014, 3:44 PM

## 2014-07-08 NOTE — Progress Notes (Signed)
Orthopedic Tech Progress Note Patient Details:  [name] Jan 28, 1931 [id number] Called in order to [facility]. Patient ID: [name], male   DOB: [birth year], 80 y.o.   MRN: [id number]   [name] 07/08/2014, 9:40 AM

## 2014-07-08 NOTE — Progress Notes (Signed)
Subjective/Complaints: No pain c/os "ask me about my bowels, not my nurse" RN notes R heel ulcer  Review of Systems - Negative except weak on left  Objective: Vital Signs: Blood pressure 150/61, pulse 59, temperature 98.3 F (36.8 C), temperature source Oral, resp. rate 18, weight 89.631 kg (197 lb 9.6 oz), SpO2 95.00%. No results found. Results for orders placed during the hospital encounter of 06/14/14 (from the past 72 hour(s))  GLUCOSE, CAPILLARY     Status: Abnormal   Collection Time    07/05/14 12:07 PM      Result Value Ref Range   Glucose-Capillary 126 (*) 70 - 99 mg/dL  GLUCOSE, CAPILLARY     Status: Abnormal   Collection Time    07/05/14  4:34 PM      Result Value Ref Range   Glucose-Capillary 121 (*) 70 - 99 mg/dL  GLUCOSE, CAPILLARY     Status: Abnormal   Collection Time    07/05/14  8:53 PM      Result Value Ref Range   Glucose-Capillary 100 (*) 70 - 99 mg/dL   Comment 1 Notify RN    BASIC METABOLIC PANEL     Status: Abnormal   Collection Time    07/06/14  3:05 AM      Result Value Ref Range   Sodium 141  137 - 147 mEq/L   Potassium 4.1  3.7 - 5.3 mEq/L   Comment: DELTA CHECK NOTED   Chloride 104  96 - 112 mEq/L   CO2 20  19 - 32 mEq/L   Glucose, Bld 105 (*) 70 - 99 mg/dL   BUN 43 (*) 6 - 23 mg/dL   Creatinine, Ser 3.29 (*) 0.50 - 1.35 mg/dL   Calcium 9.9  8.4 - 10.5 mg/dL   GFR calc non Af Amer 16 (*) >90 mL/min   GFR calc Af Amer 19 (*) >90 mL/min   Comment: (NOTE)     The eGFR has been calculated using the CKD EPI equation.     This calculation has not been validated in all clinical situations.     eGFR's persistently <90 mL/min signify possible Chronic Kidney     Disease.   Anion gap 17 (*) 5 - 15  GLUCOSE, CAPILLARY     Status: None   Collection Time    07/06/14  6:21 AM      Result Value Ref Range   Glucose-Capillary 96  70 - 99 mg/dL   Comment 1 Notify RN    GLUCOSE, CAPILLARY     Status: Abnormal   Collection Time    07/06/14 11:38  AM      Result Value Ref Range   Glucose-Capillary 122 (*) 70 - 99 mg/dL  GLUCOSE, CAPILLARY     Status: Abnormal   Collection Time    07/06/14  4:29 PM      Result Value Ref Range   Glucose-Capillary 123 (*) 70 - 99 mg/dL  GLUCOSE, CAPILLARY     Status: Abnormal   Collection Time    07/06/14  8:36 PM      Result Value Ref Range   Glucose-Capillary 112 (*) 70 - 99 mg/dL   Comment 1 Notify RN    BASIC METABOLIC PANEL     Status: Abnormal   Collection Time    07/07/14  5:13 AM      Result Value Ref Range   Sodium 138  137 - 147 mEq/L   Potassium 3.9  3.7 -  5.3 mEq/L   Chloride 101  96 - 112 mEq/L   CO2 20  19 - 32 mEq/L   Glucose, Bld 90  70 - 99 mg/dL   BUN 43 (*) 6 - 23 mg/dL   Creatinine, Ser 3.14 (*) 0.50 - 1.35 mg/dL   Calcium 9.6  8.4 - 10.5 mg/dL   GFR calc non Af Amer 17 (*) >90 mL/min   GFR calc Af Amer 20 (*) >90 mL/min   Comment: (NOTE)     The eGFR has been calculated using the CKD EPI equation.     This calculation has not been validated in all clinical situations.     eGFR's persistently <90 mL/min signify possible Chronic Kidney     Disease.   Anion gap 17 (*) 5 - 15  GLUCOSE, CAPILLARY     Status: None   Collection Time    07/07/14  6:22 AM      Result Value Ref Range   Glucose-Capillary 93  70 - 99 mg/dL   Comment 1 Notify RN    GLUCOSE, CAPILLARY     Status: Abnormal   Collection Time    07/07/14 11:33 AM      Result Value Ref Range   Glucose-Capillary 135 (*) 70 - 99 mg/dL   Comment 1 Notify RN    GLUCOSE, CAPILLARY     Status: Abnormal   Collection Time    07/07/14  4:31 PM      Result Value Ref Range   Glucose-Capillary 137 (*) 70 - 99 mg/dL   Comment 1 Notify RN    GLUCOSE, CAPILLARY     Status: Abnormal   Collection Time    07/07/14  9:43 PM      Result Value Ref Range   Glucose-Capillary 128 (*) 70 - 99 mg/dL  BASIC METABOLIC PANEL     Status: Abnormal   Collection Time    07/08/14  5:43 AM      Result Value Ref Range   Sodium 139   137 - 147 mEq/L   Potassium 4.1  3.7 - 5.3 mEq/L   Chloride 103  96 - 112 mEq/L   CO2 22  19 - 32 mEq/L   Glucose, Bld 105 (*) 70 - 99 mg/dL   BUN 40 (*) 6 - 23 mg/dL   Creatinine, Ser 2.78 (*) 0.50 - 1.35 mg/dL   Calcium 9.8  8.4 - 10.5 mg/dL   GFR calc non Af Amer 20 (*) >90 mL/min   GFR calc Af Amer 23 (*) >90 mL/min   Comment: (NOTE)     The eGFR has been calculated using the CKD EPI equation.     This calculation has not been validated in all clinical situations.     eGFR's persistently <90 mL/min signify possible Chronic Kidney     Disease.   Anion gap 14  5 - 15  GLUCOSE, CAPILLARY     Status: Abnormal   Collection Time    07/08/14  7:00 AM      Result Value Ref Range   Glucose-Capillary 109 (*) 70 - 99 mg/dL     HEENT: normal Cardio: RRR and no murmur Resp: CTA B/L and unlabored GI: BS positive and NT, ND Extremity:  No Edema Skin:   Right heel ulcer  Neuro: Lethargic, Flat, Cranial Nerve Abnormalities Left central 7, Abnormal Sensory decreased in both feet, Abnormal Motor bilateral hand intrinsic atrophy and  motor tr Left bi, tri grip, 3- Left HF, KE ,  2- ankle DF Musc/Skel:  Other no pain with AROM in BUE and BLE, or with neck movement, sitting balance fair minus Left shoulder sublux Gen NAD    Assessment/Plan: 1. Functional deficits secondary to RIght pontomedullary hemorrhage with Left hemiparesis superimposed on severe diabetic neuropathy which require 3+ hours per day of interdisciplinary therapy in a comprehensive inpatient rehab setting. Physiatrist is providing close team supervision and 24 hour management of active medical problems listed below. Physiatrist and rehab team continue to assess barriers to discharge/monitor patient progress toward functional and medical goals.   FIM: FIM - Bathing Bathing Steps Patient Completed: Chest;Left Arm;Abdomen;Front perineal area;Right upper leg;Left upper leg Bathing: 3: Mod-Patient completes 5-7 32f 10 parts or  50-74%  FIM - Upper Body Dressing/Undressing Upper body dressing/undressing steps patient completed: Pull shirt over trunk Upper body dressing/undressing: 0: Wears gown/pajamas-no public clothing FIM - Lower Body Dressing/Undressing Lower body dressing/undressing steps patient completed: Thread/unthread left pants leg Lower body dressing/undressing: 0: Wears Oceanographer  FIM - Hotel manager Devices: Grab bar or rail for support Toileting: 1: Total-Patient completed zero steps, helper did all 3  FIM - Diplomatic Services operational officer Devices: Bedside commode;Grab bars Toilet Transfers: 2-To toilet/BSC: Max A (lift and lower assist);1-From toilet/BSC: Total A (helper does all/Pt. < 25%);1-Two helpers  FIM - Banker Devices: Arm rests Bed/Chair Transfer: 3: Supine > Sit: Mod A (lifting assist/Pt. 50-74%/lift 2 legs;3: Sit > Supine: Mod A (lifting assist/Pt. 50-74%/lift 2 legs);3: Bed > Chair or W/C: Mod A (lift or lower assist);2: Chair or W/C > Bed: Max A (lift and lower assist)  FIM - Locomotion: Wheelchair Distance: 75 Locomotion: Wheelchair: 0: Activity did not occur FIM - Locomotion: Ambulation Locomotion: Ambulation Assistive Devices: Interior and spatial designer Ambulation/Gait Assistance: 1: +2 Total assist;3: Mod assist Locomotion: Ambulation: 0: Activity did not occur  Comprehension Comprehension Mode: Auditory Comprehension: 5-Follows basic conversation/direction: With extra time/assistive device  Expression Expression Mode: Verbal Expression: 4-Expresses basic 75 - 89% of the time/requires cueing 10 - 24% of the time. Needs helper to occlude trach/needs to repeat words.  Social Interaction Social Interaction: 4-Interacts appropriately 75 - 89% of the time - Needs redirection for appropriate language or to initiate interaction.  Problem Solving Problem Solving: 4-Solves basic 75 - 89% of the  time/requires cueing 10 - 24% of the time  Memory Memory: 3-Recognizes or recalls 50 - 74% of the time/requires cueing 25 - 49% of the time  Medical Problem List and Plan:  1. 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 Latest cranial CT scan stable  2. DVT Prophylaxis/Anticoagulation: Subcutaneous heparin initiated for DVT prophylaxis 06/13/2014  3. Pain Management: Tylenol as needed. Monitor the increased mobility  4. Diabetes mellitus with peripheral neuropathy. Latest hemoglobin A1c 6.7. Check blood sugars a.c. and at bedtime.Tradjenta 5 mg daily.Just on Trajenta now, Patient on Levemir 15 units each bedtime and Onglyza 2.5 mg daily prior to admission.    -consider resuming levemir if sugars remain elevated 5. Neuropsych: This patient is not capable of making decisions on his own behalf.  6. Skin/Wound Care: Routine skin checks , R heel decubitus, order PRAFO, Left heel decubitus, cont PRAFO 7. Chronic renal insufficiency. Baseline creatinine 2.84. Followup chemistries show stable GFR ~20  -Cr up to >3  -monitor wts  -off IVF, lasix on hold, monitor  -continue to encourage fluids    -recheck bmet tomorrow 8. Chronic diastolic congestive heart failure. Monitor for  any signs of fluid overload. Patient on Lasix 80 mg Daily.(held given #7) no peripheral edema 9. Hypertension. Norvasc 10 mg daily, Coreg 6.25 mg twice a day. Monitor with increased mobility  10. CAD with history of CABG. No chest pain or shortness of breath  11. Hyperlipidemia. Lipitor  12. GERD. Protonix   LOS (Days) 24 A FACE TO FACE EVALUATION WAS PERFORMED   KIRSTEINS,ANDREW E 07/08/2014, 7:28 AM

## 2014-07-09 ENCOUNTER — Inpatient Hospital Stay (HOSPITAL_COMMUNITY): Payer: Medicare HMO | Admitting: Occupational Therapy

## 2014-07-09 ENCOUNTER — Inpatient Hospital Stay (HOSPITAL_COMMUNITY): Payer: Medicare Other | Admitting: Speech Pathology

## 2014-07-09 ENCOUNTER — Inpatient Hospital Stay (HOSPITAL_COMMUNITY): Payer: Medicare HMO | Admitting: Physical Therapy

## 2014-07-09 LAB — GLUCOSE, CAPILLARY
GLUCOSE-CAPILLARY: 116 mg/dL — AB (ref 70–99)
Glucose-Capillary: 89 mg/dL (ref 70–99)
Glucose-Capillary: 152 mg/dL — ABNORMAL HIGH (ref 70–99)
Glucose-Capillary: 146 mg/dL — ABNORMAL HIGH (ref 70–99)

## 2014-07-09 NOTE — Progress Notes (Addendum)
Physical Therapy Session Note  Patient Details  Name: [name] MRN: [id number] Date of Birth: January 08, 1931  Today's Date: 07/09/2014 PT Co-Treatment Time: 1005-1035 (Co-tx with NP (SLP); entire session from [phone]) PT Co-Treatment Time Calculation (min): 30 min  Short Term Goals: Week 3:  PT Short Term Goal 1 (Week 3): Pt will consistently perform sit>supine with min A with HOB flat with 25% cueing for technique. PT Short Term Goal 2 (Week 3): Pt will consistently transfer from bed<>w/c to R and L sides with mod A and 50% cueing for technique. PT Short Term Goal 3 (Week 3): Pt will consistently perform sit<>stand with mod A and 50% cueing for technique. PT Short Term Goal 4 (Week 3): Pt will perform gait x10' in controlled environment with Total A of single therapist. PT Short Term Goal 5 (Week 3): Pt will perform w/c mobility x100' in controlled environment with min A and 25% cueing.  Skilled Therapeutic Interventions/Progress Updates:    Skilled co-tx with primary SLP focusing on midline orientation, activity tolerance. Pt performed standing x42 minutes with total A using standing frame with 2" riser under L foot to promote midline alignment. Pt concurrently participated in activities focusing on speech intelligibility; see SLP note for further detail. This therapist provided multimodal cueing to promote LLE stance stability (hip/knee extension), cervical/thoracic extension, and trunk rotation to R side. Session ended in pt room, where pt was left seated in w/c with quick release belt in place for safety and all needs within reach.  Addendum: downgraded goal for bed mobility due to slow pt progress.  Therapy Documentation Precautions:  Precautions Precautions: Fall Restrictions Weight Bearing Restrictions: No Pain: Pain Assessment Pain Assessment: No/denies pain  See FIM for current functional status  Therapy/Group: Co-Treatment  Hobble, Blair A 07/09/2014, 10:46 AM

## 2014-07-09 NOTE — Discharge Instructions (Signed)
Inpatient Rehab Discharge Instructions  [name] Discharge date and time: No discharge date for patient encounter.   Activities/Precautions/ Functional Status: Activity: activity as tolerated Diet: diabetic diet Wound Care: none needed Functional status:  ___ No restrictions     ___ Walk up steps independently ___ 24/7 supervision/assistance   ___ Walk up steps with assistance ___ Intermittent supervision/assistance  ___ Bathe/dress independently ___ Walk with walker     ___ Bathe/dress with assistance ___ Walk Independently    ___ Shower independently _x STROKE/TIA DISCHARGE INSTRUCTIONS SMOKING Cigarette smoking nearly doubles your risk of having a stroke & is the single most alterable risk factor  If you smoke or have smoked in the last 12 months, you are advised to quit smoking for your health.  Most of the excess cardiovascular risk related to smoking disappears within a year of stopping.  Ask you doctor about anti-smoking medications  [place] Quit Line: 1-800-QUIT NOW  Free Smoking Cessation Classes [phone]  CHOLESTEROL Know your levels; limit fat & cholesterol in your diet  Lipid Panel     Component Value Date/Time   CHOL 109 06/13/2014 0425   TRIG 61 06/13/2014 0425   HDL 38* 06/13/2014 0425   CHOLHDL 2.9 06/13/2014 0425   VLDL 12 06/13/2014 0425   LDLCALC 59 06/13/2014 0425      Many patients benefit from treatment even if their cholesterol is at goal.  Goal: Total Cholesterol (CHOL) less than 160  Goal:  Triglycerides (TRIG) less than 150  Goal:  HDL greater than 40  Goal:  LDL (LDLCALC) less than 100   BLOOD PRESSURE American Stroke Association blood pressure target is less that 120/80 mm/Hg  Your discharge blood pressure is:  BP: 128/53 mmHg  Monitor your blood pressure  Limit your salt and alcohol intake  Many individuals will require more than one medication for high blood pressure  DIABETES (A1c is a blood sugar average for last 3 months) Goal  HGBA1c is under 7% (HBGA1c is blood sugar average for last 3 months)  Diabetes:     Lab Results  Component Value Date   HGBA1C 6.7* 06/11/2014     Your HGBA1c can be lowered with medications, healthy diet, and exercise.  Check your blood sugar as directed by your physician  Call your physician if you experience unexplained or low blood sugars.  PHYSICAL ACTIVITY/REHABILITATION Goal is 30 minutes at least 4 days per week  Activity: Increase activity slowly, Therapies: Physical Therapy: Home Health Return to work:   Activity decreases your risk of heart attack and stroke and makes your heart stronger.  It helps control your weight and blood pressure; helps you relax and can improve your mood.  Participate in a regular exercise program.  Talk with your doctor about the best form of exercise for you (dancing, walking, swimming, cycling).  DIET/WEIGHT Goal is to maintain a healthy weight  Your discharge diet is: Carb Control  liquids Your height is:    Your current weight is: Weight: 89.631 kg (197 lb 9.6 oz) Your Body Mass Index (BMI) is:     Following the type of diet specifically designed for you will help prevent another stroke.  Your goal weight range is:    Your goal Body Mass Index (BMI) is 19-24.  Healthy food habits can help reduce 3 risk factors for stroke:  High cholesterol, hypertension, and excess weight.  RESOURCES Stroke/Support Group:  Call [facility] PROVIDED/REVIEWED AND GIVEN TO PATIENT Stroke  warning signs and symptoms How to activate emergency medical system (call 911). Medications prescribed at discharge. Need for follow-up after discharge. Personal risk factors for stroke. Pneumonia vaccine given:  Flu vaccine given:  My questions have been answered, the writing is legible, and I understand these instructions.  I will adhere to these goals & educational materials that have been provided to me after my discharge from the hospital.     __ Walk with assistance    ___ Shower with assistance ___ No alcohol     ___ Return to work/school ________  Special Instructions:    My questions have been answered and I understand these instructions. I will adhere to these goals and the provided educational materials after my discharge from the hospital.  Patient/Caregiver Signature _______________________________ Date __________  Clinician Signature _______________________________________ Date __________  Please bring this form and your medication list with you to all your follow-up doctor's appointments.

## 2014-07-09 NOTE — Progress Notes (Signed)
Subjective/Complaints: "my nephew in [place] had a stroke and they took out the blood clot and he was ok" Discussed hemorrhagic vs ishemic strokes RN notes R heel ulcer  Review of Systems - Negative except weak on left  Objective: Vital Signs: Blood pressure 128/53, pulse 59, temperature 98 F (36.7 C), temperature source Oral, resp. rate 18, weight 89.631 kg (197 lb 9.6 oz), SpO2 96.00%. No results found. Results for orders placed during the hospital encounter of 06/14/14 (from the past 72 hour(s))  GLUCOSE, CAPILLARY     Status: Abnormal   Collection Time    07/06/14 11:38 AM      Result Value Ref Range   Glucose-Capillary 122 (*) 70 - 99 mg/dL  GLUCOSE, CAPILLARY     Status: Abnormal   Collection Time    07/06/14  4:29 PM      Result Value Ref Range   Glucose-Capillary 123 (*) 70 - 99 mg/dL  GLUCOSE, CAPILLARY     Status: Abnormal   Collection Time    07/06/14  8:36 PM      Result Value Ref Range   Glucose-Capillary 112 (*) 70 - 99 mg/dL   Comment 1 Notify RN    BASIC METABOLIC PANEL     Status: Abnormal   Collection Time    07/07/14  5:13 AM      Result Value Ref Range   Sodium 138  137 - 147 mEq/L   Potassium 3.9  3.7 - 5.3 mEq/L   Chloride 101  96 - 112 mEq/L   CO2 20  19 - 32 mEq/L   Glucose, Bld 90  70 - 99 mg/dL   BUN 43 (*) 6 - 23 mg/dL   Creatinine, Ser 3.14 (*) 0.50 - 1.35 mg/dL   Calcium 9.6  8.4 - 10.5 mg/dL   GFR calc non Af Amer 17 (*) >90 mL/min   GFR calc Af Amer 20 (*) >90 mL/min   Comment: (NOTE)     The eGFR has been calculated using the CKD EPI equation.     This calculation has not been validated in all clinical situations.     eGFR's persistently <90 mL/min signify possible Chronic Kidney     Disease.   Anion gap 17 (*) 5 - 15  GLUCOSE, CAPILLARY     Status: None   Collection Time    07/07/14  6:22 AM      Result Value Ref Range   Glucose-Capillary 93  70 - 99 mg/dL   Comment 1 Notify RN    GLUCOSE, CAPILLARY     Status: Abnormal   Collection Time    07/07/14 11:33 AM      Result Value Ref Range   Glucose-Capillary 135 (*) 70 - 99 mg/dL   Comment 1 Notify RN    GLUCOSE, CAPILLARY     Status: Abnormal   Collection Time    07/07/14  4:31 PM      Result Value Ref Range   Glucose-Capillary 137 (*) 70 - 99 mg/dL   Comment 1 Notify RN    GLUCOSE, CAPILLARY     Status: Abnormal   Collection Time    07/07/14  9:43 PM      Result Value Ref Range   Glucose-Capillary 128 (*) 70 - 99 mg/dL  BASIC METABOLIC PANEL     Status: Abnormal   Collection Time    07/08/14  5:43 AM      Result Value Ref Range   Sodium  139  137 - 147 mEq/L   Potassium 4.1  3.7 - 5.3 mEq/L   Chloride 103  96 - 112 mEq/L   CO2 22  19 - 32 mEq/L   Glucose, Bld 105 (*) 70 - 99 mg/dL   BUN 40 (*) 6 - 23 mg/dL   Creatinine, Ser 2.78 (*) 0.50 - 1.35 mg/dL   Calcium 9.8  8.4 - 10.5 mg/dL   GFR calc non Af Amer 20 (*) >90 mL/min   GFR calc Af Amer 23 (*) >90 mL/min   Comment: (NOTE)     The eGFR has been calculated using the CKD EPI equation.     This calculation has not been validated in all clinical situations.     eGFR's persistently <90 mL/min signify possible Chronic Kidney     Disease.   Anion gap 14  5 - 15  GLUCOSE, CAPILLARY     Status: Abnormal   Collection Time    07/08/14  7:00 AM      Result Value Ref Range   Glucose-Capillary 109 (*) 70 - 99 mg/dL  GLUCOSE, CAPILLARY     Status: Abnormal   Collection Time    07/08/14 11:40 AM      Result Value Ref Range   Glucose-Capillary 123 (*) 70 - 99 mg/dL  GLUCOSE, CAPILLARY     Status: Abnormal   Collection Time    07/08/14  4:26 PM      Result Value Ref Range   Glucose-Capillary 163 (*) 70 - 99 mg/dL  GLUCOSE, CAPILLARY     Status: Abnormal   Collection Time    07/08/14  9:45 PM      Result Value Ref Range   Glucose-Capillary 117 (*) 70 - 99 mg/dL  GLUCOSE, CAPILLARY     Status: None   Collection Time    07/09/14  6:49 AM      Result Value Ref Range   Glucose-Capillary 89  70 -  99 mg/dL     HEENT: normal Cardio: RRR and no murmur Resp: CTA B/L and unlabored GI: BS positive and NT, ND Extremity:  No Edema Skin:   Right heel ulcer  Neuro: Lethargic, Flat, Cranial Nerve Abnormalities Left central 7, Abnormal Sensory decreased in both feet, Abnormal Motor bilateral hand intrinsic atrophy and  motor tr Left bi, tri grip, 3- Left HF, KE , 2- ankle DF Musc/Skel:  Other no pain with AROM in BUE and BLE, or with neck movement, sitting balance fair minus Left shoulder sublux Gen NAD    Assessment/Plan: 1. Functional deficits secondary to RIght pontomedullary hemorrhage with Left hemiparesis superimposed on severe diabetic neuropathy which require 3+ hours per day of interdisciplinary therapy in a comprehensive inpatient rehab setting. Physiatrist is providing close team supervision and 24 hour management of active medical problems listed below. Physiatrist and rehab team continue to assess barriers to discharge/monitor patient progress toward functional and medical goals.   FIM: FIM - Bathing Bathing Steps Patient Completed: Chest;Left Arm;Abdomen;Front perineal area;Right upper leg;Left upper leg Bathing: 3: Mod-Patient completes 5-7 68f 10 parts or 50-74%  FIM - Upper Body Dressing/Undressing Upper body dressing/undressing steps patient completed: Pull shirt over trunk Upper body dressing/undressing: 2: Max-Patient completed 25-49% of tasks FIM - Lower Body Dressing/Undressing Lower body dressing/undressing steps patient completed: Thread/unthread left pants leg Lower body dressing/undressing: 1: Total-Patient completed less than 25% of tasks  FIM - Musician Devices: Grab bar or rail for support Toileting: 1: Total-Patient completed zero steps,  helper did all 3  FIM - Radio producer Devices: Bedside commode;Grab bars Toilet Transfers: 2-To toilet/BSC: Max A (lift and lower assist);1-From toilet/BSC: Total A  (helper does all/Pt. < 25%);1-Two helpers  FIM - Financial planner Transfer: 4: Chair or W/C > Bed: Min A (steadying Pt. > 75%);3: Bed > Chair or W/C: Mod A (lift or lower assist)  FIM - Locomotion: Wheelchair Distance: 75 Locomotion: Wheelchair: 2: Travels 50 - 149 ft with minimal assistance (Pt.>75%) FIM - Locomotion: Ambulation Locomotion: Ambulation Assistive Devices: Ethelene Hal Ambulation/Gait Assistance: 1: +2 Total assist;3: Mod assist Locomotion: Ambulation: 0: Activity did not occur  Comprehension Comprehension Mode: Auditory Comprehension: 5-Follows basic conversation/direction: With extra time/assistive device  Expression Expression Mode: Verbal Expression: 4-Expresses basic 75 - 89% of the time/requires cueing 10 - 24% of the time. Needs helper to occlude trach/needs to repeat words.  Social Interaction Social Interaction: 4-Interacts appropriately 75 - 89% of the time - Needs redirection for appropriate language or to initiate interaction.  Problem Solving Problem Solving: 4-Solves basic 75 - 89% of the time/requires cueing 10 - 24% of the time  Memory Memory: 3-Recognizes or recalls 50 - 74% of the time/requires cueing 25 - 49% of the time  Medical Problem List and Plan:  1. 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 Latest cranial CT scan stable  2. DVT Prophylaxis/Anticoagulation: Subcutaneous heparin initiated for DVT prophylaxis 06/13/2014  3. Pain Management: Tylenol as needed. Monitor the increased mobility  4. Diabetes mellitus with peripheral neuropathy. Latest hemoglobin A1c 6.7. Check blood sugars a.c. and at bedtime.Tradjenta 5 mg daily.Just on Trajenta now, Patient on Levemir 15 units each bedtime and Onglyza 2.5 mg daily prior to admission.    -consider resuming levemir if sugars remain elevated 5. Neuropsych: This patient is not capable  of making decisions on his own behalf.  6. Skin/Wound Care: Routine skin checks , R heel decubitus, order PRAFO, Left heel decubitus, cont PRAFO 7. Chronic renal insufficiency. Baseline creatinine 2.84. Followup chemistries show stable GFR ~20  -Cr 2.78 at baseline  -monitor wts  -off IVF, lasix on hold, monitor  -continue to encourage fluids    -recheck bmet tomorrow 8. Chronic diastolic congestive heart failure. Monitor for any signs of fluid overload. Patient on Lasix 80 mg Daily.(held given #7) no peripheral edema 9. Hypertension. Norvasc 10 mg daily, Coreg 6.25 mg twice a day. Monitor with increased mobility  10. CAD with history of CABG. No chest pain or shortness of breath  11. Hyperlipidemia. Lipitor  12. GERD. Protonix   LOS (Days) 25 A FACE TO FACE EVALUATION WAS PERFORMED   Kyia Rhude E 07/09/2014, 7:45 AM

## 2014-07-09 NOTE — Progress Notes (Signed)
Occupational Therapy Session Note  Patient Details  Name: [name] MRN: [id number] Date of Birth: September 23, 1931  Today's Date: 07/09/2014 OT Individual Time: 9678-9381 and 0175-1025 OT Individual Time Calculation (min): 63 min and 30 min   Short Term Goals: Week 3:  OT Short Term Goal 1 (Week 3): Pt will perform LB dressing with Max A OT Short Term Goal 2 (Week 3): Pt will perform UB dressing with Min A OT Short Term Goal 3 (Week 3): Pt will perform BSC toilet transfers at [place] Term Goal 4 (Week 3): Pt will complete 1 of 3 toileting tasks  Skilled Therapeutic Interventions/Progress Updates:    1) Engaged in ADL retraining with focus on bed mobility, sitting balance, and increased participation in self-care tasks of bathing and dressing.  Pt in bed upon arrival, engaged in LB bathing and dressing at bed level to decrease burden of care.  Pt completed perineal hygiene and rolling at supervision level with use of bed rails for therapist to wash buttocks and don brief.  After therapist thread pants, pt able to pull pants over knees, requiring rolling side to side for therapist to pull pants over hips.  Discussed option of bridging vs rolling, plan to attempt bridging during next session.  Squat pivot transfer bed > w/c with over the back technique (max assist), providing pt increased time to initiate weight shift to increase participation in transfer. UB bathing and dressing completed at sink with focus on increased use of LUE, utilizing hand over hand assist to wash Rt underarm and apply deodorant.  Pt with appropriate initiation during UB bathing and dressing, requiring increased wait time to allow pt to initiate.  2) Engaged in [place] with focus on LUE ROM and retrograde massage to decrease edema in fingers, hand, and wrist.  Educated pt on elevating LUE and increased use to decrease edema.  PROM to Lt wrist in all directions followed by increased grasp and isolated thumb movement.   Simulated UB bathing with support at elbow and wrist while pt completed horizontal adduction and elbow flexion/extension to "wash" forearm with improved active movement.  Educated pt on self-ROM finger flexion/extension to begin to address edema and provide exercises he can complete in room. Pt setup in w/c in room with LUE propped on pillow and rolled towel to provide elevate at hand/wrist.    Therapy Documentation Precautions:  Precautions Precautions: Fall Restrictions Weight Bearing Restrictions: No Pain: Pain Assessment Pain Assessment: No/denies pain  See FIM for current functional status  Therapy/Group: Individual Therapy  Simonne Come 07/09/2014, 10:56 AM

## 2014-07-09 NOTE — Progress Notes (Signed)
Speech Language Pathology Daily Session Note  Patient Details  Name: [name] MRN: [id number] Date of Birth: 1931-01-24  Today's Date: 07/09/2014 SLP Co-Treatment Time: Session 3:2951-8841 (co-tx with PT [phone]); Session 2: 0109-3235 SLP Co-Treatment Time Calculation (min): Session 1- 30 min; Session 2- 30 min   Short Term Goals: Week 4: SLP Short Term Goal 1 (Week 4): Patient will utilize speech strategies (slow down, over-articulate, pause between words) to achieve >80% intelligibility at phrase level with mod I. SLP Short Term Goal 2 (Week 4): Patient will demonstrate intellectual awareness by identifying at least 1 speech and 1 physical deficit, with min assist. SLP Short Term Goal 3 (Week 4): Pt will use environmental aids to facilitate improved recall of daily information for 75% accuracy wtih mod assist  SLP Short Term Goal 4 (Week 4): Patient will solve semi-complex level functional problems wtih 80% accuracy with supervision.  Skilled Therapeutic Interventions:  Session 1: Pt was seen for skilled co-tx with PT targeting cognitive-linguistic goals while in the standing frame.  SLP facilitated the session with a structured categorical generation task targeting use of dysarthria strategies at the phrase/sentence level in a loud and highly distracting environment.  Pt was noted with significantly improved breath support for speech in standing versus when seated in the wheelchair, and, as a result, presented with increased vocal intensity and overall 80% intelligibility for functional communication.   Pt also benefited from min-mod assist cues for mental flexibility and working memory to complete the abovementioned task for ~75% accuracy.    Session 2: Pt was seen for skilled ST targeting cognitive-linguistic goals.  Upon arrival, pt was seated upright in wheelchair, awake, alert, and agreeable to participate in [place].  SLP facilitated the session with guided questions targeting recall  of information from previous therapy session.  Pt was able to recall at least 3 therapeutic activities/goals of treatment in response to questions with overall min assist verbal cues.  Furthermore, SLP facilitated the session with a structured new learning activity targeting planning, thought organization, and error awareness, with pt able to complete task for ~80% accuracy with min-mod verbal cues for working memory and processing speed.  Continue per current plan of care.    FIM:  Comprehension Comprehension Mode: Auditory Comprehension: 5-Follows basic conversation/direction: With extra time/assistive device Expression Expression Mode: Verbal Expression: 4-Expresses basic 75 - 89% of the time/requires cueing 10 - 24% of the time. Needs helper to occlude trach/needs to repeat words. Social Interaction Social Interaction: 4-Interacts appropriately 75 - 89% of the time - Needs redirection for appropriate language or to initiate interaction. Problem Solving Problem Solving: 4-Solves basic 75 - 89% of the time/requires cueing 10 - 24% of the time Memory Memory: 3-Recognizes or recalls 50 - 74% of the time/requires cueing 25 - 49% of the time  Pain Pain Assessment (Session 1) Pain Assessment: No/denies pain Pain Assessment (Session 2) Pain Assessment: No/denies pain  Therapy/Group: Individual Therapy  [name], M.A. CCC-SLP  Hoyle Barkdull, Selinda Orion 07/09/2014, 3:45 PM

## 2014-07-10 ENCOUNTER — Inpatient Hospital Stay (HOSPITAL_COMMUNITY): Payer: Medicare Other | Admitting: Physical Therapy

## 2014-07-10 ENCOUNTER — Inpatient Hospital Stay (HOSPITAL_COMMUNITY): Payer: Medicare Other | Admitting: Speech Pathology

## 2014-07-10 ENCOUNTER — Inpatient Hospital Stay (HOSPITAL_COMMUNITY): Payer: Medicare HMO | Admitting: Occupational Therapy

## 2014-07-10 DIAGNOSIS — I610 Nontraumatic intracerebral hemorrhage in hemisphere, subcortical: Secondary | ICD-10-CM

## 2014-07-10 DIAGNOSIS — N185 Chronic kidney disease, stage 5: Secondary | ICD-10-CM

## 2014-07-10 DIAGNOSIS — I4891 Unspecified atrial fibrillation: Secondary | ICD-10-CM

## 2014-07-10 LAB — GLUCOSE, CAPILLARY
GLUCOSE-CAPILLARY: 86 mg/dL (ref 70–99)
GLUCOSE-CAPILLARY: 176 mg/dL — AB (ref 70–99)
GLUCOSE-CAPILLARY: 99 mg/dL (ref 70–99)
Glucose-Capillary: 155 mg/dL — ABNORMAL HIGH (ref 70–99)

## 2014-07-10 NOTE — Progress Notes (Signed)
Orthopedic Tech Progress Note Patient Details:  [name] [birth year] [id number] Called in order to [facility]. Patient ID: [name], male   DOB: [birth year], 80 y.o.   MRN: [id number]   [name] 07/10/2014, 3:34 PM

## 2014-07-10 NOTE — Progress Notes (Signed)
Physical Therapy Weekly Progress Note  Patient Details  Name: [name] MRN: [id number] Date of Birth: 1930/12/29  Beginning of progress report period: July 02, 2014 End of progress report period: July 10, 2014  Today's Date: 07/10/2014 PT Individual Time: 5573-2202 PT Individual Time Calculation (min): 60 min   Patient has met 1 of 5 short term goals, pt is progressing toward 2 goals and has partially met 1 goal.  Patient continues to demonstrate the following deficits: muscle weakness, impaired timing and sequencing, unbalanced muscle activation, ataxia, decreased coordination and decreased motor planning, decreased midline orientation, decreased initiation, decreased memory and delayed processing and decreased standing balance, decreased postural control and hemiplegia and therefore will continue to benefit from skilled PT intervention to enhance overall performance with activity tolerance, balance, postural control, ability to compensate for deficits, functional use of  left upper extremity and left lower extremity, awareness and coordination.  Patient progressing toward long term goals..  Plan of care revisions: upgraded goal for bed mobility due to increased independence with use of bed rail..  PT Short Term Goals Week 1:  PT Short Term Goal 1 (Week 1): Pt will demonstrate rolling L in bed req min A PT Short Term Goal 1 - Progress (Week 1): Met PT Short Term Goal 2 (Week 1): Pt will demonstrate supine to sit req mod A PT Short Term Goal 2 - Progress (Week 1): Met PT Short Term Goal 3 (Week 1): Pt will demonstrate stand-pivot transfer bed to w/c req mod A.  PT Short Term Goal 3 - Progress (Week 1): Discontinued (comment) (Focusing on squat pivot transfers) PT Short Term Goal 4 (Week 1): Pt will self propel manual w/c x 50' req min A PT Short Term Goal 4 - Progress (Week 1): Revised due to lack of progress PT Short Term Goal 5 (Week 1): Pt will tolerate ambulation with 2  person assist.  PT Short Term Goal 5 - Progress (Week 1): Met Week 2:  PT Short Term Goal 1 (Week 2): Pt will perform supine>sit with supervision with HOB flat using bed rail. PT Short Term Goal 1 - Progress (Week 2): Met PT Short Term Goal 2 (Week 2): Pt will perform sit>supine with mod A with HOB flat. PT Short Term Goal 2 - Progress (Week 2): Met PT Short Term Goal 3 (Week 2): Pt will transfer from bed<>w/c with min A and 25% cueing for technique. PT Short Term Goal 3 - Progress (Week 2): Progressing toward goal PT Short Term Goal 4 (Week 2): Pt will perform sit>stand with mod A and 50% cueing for technique. PT Short Term Goal 4 - Progress (Week 2): Partly met PT Short Term Goal 5 (Week 2): Pt will perform gait x25' with max A of single therapist. PT Short Term Goal 5 - Progress (Week 2): Not met Week 3:  PT Short Term Goal 1 (Week 3): Pt will consistently perform sit>supine with min A with HOB flat with 25% cueing for technique. PT Short Term Goal 1 - Progress (Week 3): Partly met (Pt able to perform without cueing with increased time and use of bed rail.) PT Short Term Goal 2 (Week 3): Pt will consistently transfer from bed<>w/c to R and L sides with mod A and 50% cueing for technique. PT Short Term Goal 2 - Progress (Week 3): Progressing toward goal PT Short Term Goal 3 (Week 3): Pt will consistently perform sit<>stand with mod A and 50% cueing for technique. PT Short Term  Goal 3 - Progress (Week 3): Progressing toward goal PT Short Term Goal 4 (Week 3): Pt will perform gait x10' in controlled environment with Total A of single therapist. PT Short Term Goal 4 - Progress (Week 3): Discontinued (comment) (Long term goals for ambulation discharged.) PT Short Term Goal 5 (Week 3): Pt will perform w/c mobility x100' in controlled environment with min A and 25% cueing. PT Short Term Goal 5 - Progress (Week 3): Met Week 4:  PT Short Term Goal 1 (Week 4): STG's = LTG's secondary to anticipated  LOS. Awaiting SNF placement at this time.  Skilled Therapeutic Interventions/Progress Updates:    1:1. Pt received semi reclined in bed; agreeable to therapy. Session focused on bed mobility, functional transfers (with emphasis on transfer setup). Pt performed supine<>sit x2 trials with HOB flat using rail requiring supervision, increased time. Performed blocked practice of lateral scooting transfers from bed<>chair using slide board with mod A for 75% of transfers, max A for 25%. Pt required mod questioning cueing, increased time to recall, perform w/c parts management. Mod A and verbal/demonstration cueing required for placement of slide board. During transfers, pt continues to require tactile cueing at bilat ribcage to actively lift buttocks from bed/chair. Departed with pt seated in w/c with quick release belt in place for safety and all needs within reach.   Therapy Documentation Precautions:  Precautions Precautions: Fall Restrictions Weight Bearing Restrictions: No Pain: Pain Assessment Pain Assessment: No/denies pain  See FIM for current functional status  Therapy/Group: Individual Therapy  Ashanty Coltrane, Malva Cogan 07/10/2014, 10:45 AM

## 2014-07-10 NOTE — Progress Notes (Signed)
Subjective/Complaints: No issues overnite  Review of Systems - Negative except weak on left  Objective: Vital Signs: Blood pressure 145/70, pulse 54, temperature 98.2 F (36.8 C), temperature source Oral, resp. rate 18, weight 89.631 kg (197 lb 9.6 oz), SpO2 97.00%. No results found. Results for orders placed during the hospital encounter of 06/14/14 (from the past 72 hour(s))  GLUCOSE, CAPILLARY     Status: Abnormal   Collection Time    07/07/14 11:33 AM      Result Value Ref Range   Glucose-Capillary 135 (*) 70 - 99 mg/dL   Comment 1 Notify RN    GLUCOSE, CAPILLARY     Status: Abnormal   Collection Time    07/07/14  4:31 PM      Result Value Ref Range   Glucose-Capillary 137 (*) 70 - 99 mg/dL   Comment 1 Notify RN    GLUCOSE, CAPILLARY     Status: Abnormal   Collection Time    07/07/14  9:43 PM      Result Value Ref Range   Glucose-Capillary 128 (*) 70 - 99 mg/dL  BASIC METABOLIC PANEL     Status: Abnormal   Collection Time    07/08/14  5:43 AM      Result Value Ref Range   Sodium 139  137 - 147 mEq/L   Potassium 4.1  3.7 - 5.3 mEq/L   Chloride 103  96 - 112 mEq/L   CO2 22  19 - 32 mEq/L   Glucose, Bld 105 (*) 70 - 99 mg/dL   BUN 40 (*) 6 - 23 mg/dL   Creatinine, Ser 2.78 (*) 0.50 - 1.35 mg/dL   Calcium 9.8  8.4 - 10.5 mg/dL   GFR calc non Af Amer 20 (*) >90 mL/min   GFR calc Af Amer 23 (*) >90 mL/min   Comment: (NOTE)     The eGFR has been calculated using the CKD EPI equation.     This calculation has not been validated in all clinical situations.     eGFR's persistently <90 mL/min signify possible Chronic Kidney     Disease.   Anion gap 14  5 - 15  GLUCOSE, CAPILLARY     Status: Abnormal   Collection Time    07/08/14  7:00 AM      Result Value Ref Range   Glucose-Capillary 109 (*) 70 - 99 mg/dL  GLUCOSE, CAPILLARY     Status: Abnormal   Collection Time    07/08/14 11:40 AM      Result Value Ref Range   Glucose-Capillary 123 (*) 70 - 99 mg/dL   GLUCOSE, CAPILLARY     Status: Abnormal   Collection Time    07/08/14  4:26 PM      Result Value Ref Range   Glucose-Capillary 163 (*) 70 - 99 mg/dL  GLUCOSE, CAPILLARY     Status: Abnormal   Collection Time    07/08/14  9:45 PM      Result Value Ref Range   Glucose-Capillary 117 (*) 70 - 99 mg/dL  GLUCOSE, CAPILLARY     Status: None   Collection Time    07/09/14  6:49 AM      Result Value Ref Range   Glucose-Capillary 89  70 - 99 mg/dL  GLUCOSE, CAPILLARY     Status: Abnormal   Collection Time    07/09/14 11:15 AM      Result Value Ref Range   Glucose-Capillary 152 (*) 70 -  99 mg/dL   Comment 1 Notify RN    GLUCOSE, CAPILLARY     Status: Abnormal   Collection Time    07/09/14  4:31 PM      Result Value Ref Range   Glucose-Capillary 146 (*) 70 - 99 mg/dL  GLUCOSE, CAPILLARY     Status: Abnormal   Collection Time    07/09/14  9:19 PM      Result Value Ref Range   Glucose-Capillary 116 (*) 70 - 99 mg/dL  GLUCOSE, CAPILLARY     Status: None   Collection Time    07/10/14  7:20 AM      Result Value Ref Range   Glucose-Capillary 86  70 - 99 mg/dL   Comment 1 Notify RN       HEENT: normal Cardio: RRR and no murmur Resp: CTA B/L and unlabored GI: BS positive and NT, ND Extremity:  No Edema Skin:   Right heel ulcer  Neuro: Lethargic, Flat, Cranial Nerve Abnormalities Left central 7, Abnormal Sensory decreased in both feet, Abnormal Motor bilateral hand intrinsic atrophy and  motor tr Left bi, tri grip, 3- Left HF, KE , 2- ankle DF Musc/Skel:  Other no pain with AROM in BUE and BLE, or with neck movement, sitting balance fair minus Left shoulder sublux Gen NAD    Assessment/Plan: 1. Functional deficits secondary to RIght pontomedullary hemorrhage with Left hemiparesis superimposed on severe diabetic neuropathy which require 3+ hours per day of interdisciplinary therapy in a comprehensive inpatient rehab setting. Physiatrist is providing close team supervision and 24 hour  management of active medical problems listed below. Physiatrist and rehab team continue to assess barriers to discharge/monitor patient progress toward functional and medical goals. Team conference today please see physician documentation under team conference tab, met with team face-to-face to discuss problems,progress, and goals. Formulized individual treatment plan based on medical history, underlying problem and comorbidities.  FIM: FIM - Bathing Bathing Steps Patient Completed: Chest;Left Arm;Abdomen;Front perineal area;Right upper leg;Left upper leg Bathing: 3: Mod-Patient completes 5-7 23f 10 parts or 50-74%  FIM - Upper Body Dressing/Undressing Upper body dressing/undressing steps patient completed: Put head through opening of pull over shirt/dress;Pull shirt over trunk Upper body dressing/undressing: 2: Max-Patient completed 25-49% of tasks FIM - Lower Body Dressing/Undressing Lower body dressing/undressing steps patient completed: Thread/unthread left pants leg Lower body dressing/undressing: 1: Total-Patient completed less than 25% of tasks  FIM - Hotel manager Devices: Grab bar or rail for support Toileting: 1: Total-Patient completed zero steps, helper did all 3  FIM - Diplomatic Services operational officer Devices: Bedside commode;Grab bars Toilet Transfers: 2-To toilet/BSC: Max A (lift and lower assist);1-From toilet/BSC: Total A (helper does all/Pt. < 25%);1-Two helpers  FIM - Banker Devices: Bed rails Bed/Chair Transfer: 4: Supine > Sit: Min A (steadying Pt. > 75%/lift 1 leg);2: Bed > Chair or W/C: Max A (lift and lower assist)  FIM - Locomotion: Wheelchair Distance: 75 Locomotion: Wheelchair: 2: Travels 50 - 149 ft with minimal assistance (Pt.>75%) FIM - Locomotion: Ambulation Locomotion: Ambulation Assistive Devices: Interior and spatial designer Ambulation/Gait Assistance: 1: +2 Total assist;3: Mod assist Locomotion:  Ambulation: 0: Activity did not occur  Comprehension Comprehension Mode: Auditory Comprehension: 5-Understands basic 90% of the time/requires cueing < 10% of the time  Expression Expression Mode: Verbal Expression: 4-Expresses basic 75 - 89% of the time/requires cueing 10 - 24% of the time. Needs helper to occlude trach/needs to repeat words.  Social Interaction Social Interaction:  4-Interacts appropriately 75 - 89% of the time - Needs redirection for appropriate language or to initiate interaction.  Problem Solving Problem Solving: 4-Solves basic 75 - 89% of the time/requires cueing 10 - 24% of the time  Memory Memory: 3-Recognizes or recalls 50 - 74% of the time/requires cueing 25 - 49% of the time  Medical Problem List and Plan:  1. 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 Latest cranial CT scan stable  2. DVT Prophylaxis/Anticoagulation: Subcutaneous heparin initiated for DVT prophylaxis 06/13/2014  3. Pain Management: Tylenol as needed. Monitor the increased mobility  4. Diabetes mellitus with peripheral neuropathy. Latest hemoglobin A1c 6.7. Check blood sugars a.c. and at bedtime.Tradjenta 5 mg daily.Just on Trajenta now, Patient on Levemir 15 units each bedtime and Onglyza 2.5 mg daily prior to admission.    -consider resuming levemir if sugars remain elevated 5. Neuropsych: This patient is not capable of making decisions on his own behalf.  6. Skin/Wound Care: Routine skin checks , R heel decubitus, order PRAFO, Left heel decubitus, cont PRAFO 7. Chronic renal insufficiency. Baseline creatinine 2.84. Followup chemistries show stable GFR ~20  -Cr 2.78 at baseline  -monitor wts  -off IVF, lasix on hold, monitor  -continue to encourage fluids    -recheck bmet tomorrow 8. Chronic diastolic congestive heart failure. Monitor for any signs of fluid overload. Patient on Lasix 80 mg Daily.(held given #7) no peripheral edema 9.  Hypertension. Norvasc 10 mg daily, Coreg 6.25 mg twice a day. Monitor with increased mobility  10. CAD with history of CABG. No chest pain or shortness of breath  11. Hyperlipidemia. Lipitor  12. GERD. Protonix   LOS (Days) 26 A FACE TO FACE EVALUATION WAS PERFORMED   Daylee Delahoz E 07/10/2014, 8:52 AM

## 2014-07-10 NOTE — Progress Notes (Signed)
Occupational Therapy Weekly Progress Note  Patient Details  Name: [name] MRN: [id number] Date of Birth: March 21, 1931  Beginning of progress report period: July 02, 2014 End of progress report period: July 10, 2014  Today's Date: 07/10/2014 OT Individual Time: 1115-1200 and 8250-5397 OT Individual Time Calculation (min): 45 min and 30 min   Patient has met 2 of 4 short term goals.  Pt is making slow progress towards goals. Family has brought in clothes which has allowed therapy to focus on dressing tasks and increased focus has been placed on functional transfers and decreasing burden of care.  Pt has progressed to max assist squat pivot transfers to bed, w/c, and drop arm BSC, however continues to require +2 assist with toileting tasks.    Patient continues to demonstrate the following deficits: muscle weakness, impaired timing and sequencing, unbalanced muscle activation, ataxia, decreased coordination and decreased motor planning, decreased midline orientation, decreased initiation, decreased memory and delayed processing and decreased standing balance, decreased postural control and hemiplegia and therefore will continue to benefit from skilled OT intervention to enhance overall performance with BADL and Reduce care partner burden.  Patient progressing toward long term goals..  Continue plan of care.  OT Short Term Goals Week 3:  OT Short Term Goal 1 (Week 3): Pt will perform LB dressing with Max A OT Short Term Goal 1 - Progress (Week 3): Progressing toward goal OT Short Term Goal 2 (Week 3): Pt will perform UB dressing with Min A OT Short Term Goal 2 - Progress (Week 3): Met OT Short Term Goal 3 (Week 3): Pt will perform BSC toilet transfers at [place] Term Goal 3 - Progress (Week 3): Met OT Short Term Goal 4 (Week 3): Pt will complete 1 of 3 toileting tasks OT Short Term Goal 4 - Progress (Week 3): Progressing toward goal Week 4:  OT Short Term Goal 1 (Week 4):  STG = LTGs due to remaining LOS  Skilled Therapeutic Interventions/Progress Updates:    1) Engaged in ADL retraining with focus on hemi-dressing technique and adaptive techniques to complete grooming tasks.  Pt received seated in w/c. Grooming completed at sink with setup assist to utilize hand over hand to stabilize toothpaste in Lt hand while opening with Rt hand.  Pt demonstrates loose grasp post wrist PROM, but still required hand over hand to stabilize toothpaste.  Pt with appropriate forward weight shift to complete oral hygiene.  Discussed dressing goals and encouraged pt to attempt UB dressing task.  Pt able to thread LUE and RUE up to elbow, asking for assist to adjust sleeve over each elbow, then able to pull shirt over head and over trunk utilizing mirror after question cue to adjust shirt at Lt shoulder.  Pt pleased with progress.  Engaged in donning pants seated in w/c with pt able to lift LLE while therapist thread pant leg and then threaded RLE, required +2 to pull pants over hips with therapist providing tactile cues at ribs for forward weight shift needed for sit > stand and mod facilitation for weight shift while second person pulled pants over hips once in standing.  Pt zipped pants.  Discussed with pt purpose of goals and increased safety and independence with self-care tasks and transfers, to which pt reports "I will try".  2) Engaged in transfer training with focus on toilet transfers and increased participation in toileting tasks.  Pt requested to transfer to regular toilet as opposed to drop arm BSC.  Utilized grab bar to steady while standing, as completed stand pivot transfer to toilet.  Pt with increased carryover of technique, verbalizing sequence prior to transfer.  Tactile cues at ribs/trunk for anterior weight shift requiring verbal cues and manual facilitation to promote weight shift towards Lt to sit on toilet.  Pt required +2 for clothing management this session.  Upon return  to toilet, pt unable to weight shift to Rt starting to push during transfer, requiring +2 for safety as pushing away from Rt during transfer.  Noted pt with increased active shoulder ROM with gravity eliminated, encouraging pt to lift LUE to promote functional positioning during transfers and in sitting to decrease edema.  Pt reports having worked with Lt hand last night, and noted decrease in edema from yesterday.  Therapy Documentation Precautions:  Precautions Precautions: Fall Restrictions Weight Bearing Restrictions: No Pain: Pain Assessment Pain Assessment: No/denies pain  See FIM for current functional status  Therapy/Group: Individual Therapy  Simonne Come 07/10/2014, 12:07 PM

## 2014-07-10 NOTE — Progress Notes (Signed)
Speech Language Pathology Daily Session Note  Patient Details  Name: [name] MRN: [id number] Date of Birth: 08/14/1931  Today's Date: 07/10/2014 SLP Individual Time: 1420-1505 SLP Individual Time Calculation (min): 45 min  Short Term Goals: Week 4: SLP Short Term Goal 1 (Week 4): Patient will utilize speech strategies (slow down, over-articulate, pause between words) to achieve >80% intelligibility at phrase level with mod I. SLP Short Term Goal 2 (Week 4): Patient will demonstrate intellectual awareness by identifying at least 1 speech and 1 physical deficit, with min assist. SLP Short Term Goal 3 (Week 4): Pt will use environmental aids to facilitate improved recall of daily information for 75% accuracy wtih mod assist  SLP Short Term Goal 4 (Week 4): Patient will solve semi-complex level functional problems wtih 80% accuracy with supervision.  Skilled Therapeutic Interventions:  Pt was seen for skilled speech therapy targeting cognitive goals.  Upon arrival, pt was seated upright in wheelchair with daughter present.  SLP facilitated the session with a semi-complex familiar task targeting functional problem solving with pt requiring overall supervision level assist to complete task.  Pt recalled at least 3 details from previous therapy session with min-mod assist guided question cues from SLP to target delayed recall of daily information.  During functional conversations with both skilled and unskilled communication partners, SLP encouraged pt with min assist verbal cues to utilize increased vocal intensity to improve his speech intelligibility to >80% at the phrase level in a loud environment. SLP also updated pt's daughter regarding pt's current goals and progress in therapy.  Continue per current plan of care.   FIM:  Comprehension Comprehension Mode: Auditory Comprehension: 5-Understands basic 90% of the time/requires cueing < 10% of the time Expression Expression Mode:  Verbal Expression: 5-Expresses basic 90% of the time/requires cueing < 10% of the time. Social Interaction Social Interaction: 4-Interacts appropriately 75 - 89% of the time - Needs redirection for appropriate language or to initiate interaction. Problem Solving Problem Solving: 4-Solves basic 75 - 89% of the time/requires cueing 10 - 24% of the time Memory Memory: 3-Recognizes or recalls 50 - 74% of the time/requires cueing 25 - 49% of the time  Pain Pain Assessment Pain Assessment: No/denies pain  Therapy/Group: Individual Therapy  [name], M.A. CCC-SLP  [name], Selinda Orion 07/10/2014, 4:20 PM

## 2014-07-11 ENCOUNTER — Inpatient Hospital Stay (HOSPITAL_COMMUNITY): Payer: Medicare HMO | Admitting: Occupational Therapy

## 2014-07-11 ENCOUNTER — Inpatient Hospital Stay (HOSPITAL_COMMUNITY): Payer: Medicare HMO | Admitting: Physical Therapy

## 2014-07-11 ENCOUNTER — Inpatient Hospital Stay (HOSPITAL_COMMUNITY): Payer: Medicare HMO

## 2014-07-11 ENCOUNTER — Inpatient Hospital Stay (HOSPITAL_COMMUNITY): Payer: Medicare Other | Admitting: Speech Pathology

## 2014-07-11 LAB — GLUCOSE, CAPILLARY
GLUCOSE-CAPILLARY: 137 mg/dL — AB (ref 70–99)
Glucose-Capillary: 98 mg/dL (ref 70–99)
Glucose-Capillary: 137 mg/dL — ABNORMAL HIGH (ref 70–99)
Glucose-Capillary: 122 mg/dL — ABNORMAL HIGH (ref 70–99)

## 2014-07-11 NOTE — Progress Notes (Addendum)
Subjective/Complaints: No issues overnite, no pain c/os, no breathing bowel or bladder issues PT notes Left foot pain, no falls or twisting has been doing transfers but not amb  Review of Systems - Negative except weak on left  Objective: Vital Signs: Blood pressure 132/53, pulse 55, temperature 99.7 F (37.6 C), temperature source Oral, resp. rate 18, weight 81.602 kg (179 lb 14.4 oz), SpO2 95.00%. No results found. Results for orders placed during the hospital encounter of 06/14/14 (from the past 72 hour(s))  GLUCOSE, CAPILLARY     Status: Abnormal   Collection Time    07/08/14 11:40 AM      Result Value Ref Range   Glucose-Capillary 123 (*) 70 - 99 mg/dL  GLUCOSE, CAPILLARY     Status: Abnormal   Collection Time    07/08/14  4:26 PM      Result Value Ref Range   Glucose-Capillary 163 (*) 70 - 99 mg/dL  GLUCOSE, CAPILLARY     Status: Abnormal   Collection Time    07/08/14  9:45 PM      Result Value Ref Range   Glucose-Capillary 117 (*) 70 - 99 mg/dL  GLUCOSE, CAPILLARY     Status: None   Collection Time    07/09/14  6:49 AM      Result Value Ref Range   Glucose-Capillary 89  70 - 99 mg/dL  GLUCOSE, CAPILLARY     Status: Abnormal   Collection Time    07/09/14 11:15 AM      Result Value Ref Range   Glucose-Capillary 152 (*) 70 - 99 mg/dL   Comment 1 Notify RN    GLUCOSE, CAPILLARY     Status: Abnormal   Collection Time    07/09/14  4:31 PM      Result Value Ref Range   Glucose-Capillary 146 (*) 70 - 99 mg/dL  GLUCOSE, CAPILLARY     Status: Abnormal   Collection Time    07/09/14  9:19 PM      Result Value Ref Range   Glucose-Capillary 116 (*) 70 - 99 mg/dL  GLUCOSE, CAPILLARY     Status: None   Collection Time    07/10/14  7:20 AM      Result Value Ref Range   Glucose-Capillary 86  70 - 99 mg/dL   Comment 1 Notify RN    GLUCOSE, CAPILLARY     Status: Abnormal   Collection Time    07/10/14 11:17 AM      Result Value Ref Range   Glucose-Capillary 176 (*)  70 - 99 mg/dL   Comment 1 Notify RN    GLUCOSE, CAPILLARY     Status: None   Collection Time    07/10/14  4:52 PM      Result Value Ref Range   Glucose-Capillary 99  70 - 99 mg/dL  GLUCOSE, CAPILLARY     Status: Abnormal   Collection Time    07/10/14  9:08 PM      Result Value Ref Range   Glucose-Capillary 155 (*) 70 - 99 mg/dL  GLUCOSE, CAPILLARY     Status: None   Collection Time    07/11/14  7:24 AM      Result Value Ref Range   Glucose-Capillary 98  70 - 99 mg/dL   Comment 1 Notify RN       HEENT: normal Cardio: RRR and no murmur Resp: CTA B/L and unlabored GI: BS positive and NT, ND Extremity:  No Edema  Skin:   Right heel ulcer  Neuro: Lethargic, Flat, Cranial Nerve Abnormalities Left central 7, Abnormal Sensory decreased in both feet, Abnormal Motor bilateral hand intrinsic atrophy and  motor tr Left bi, tri grip, 3- Left HF, KE , 2- ankle DF Musc/Skel:  Other no pain with AROM in BUE and BLE, or with neck movement, sitting balance fair minus Left shoulder sublux Lateral foot tenderness 5th met head Gen NAD    Assessment/Plan: 1. Functional deficits secondary to RIght pontomedullary hemorrhage with Left hemiparesis superimposed on severe diabetic neuropathy which require 3+ hours per day of interdisciplinary therapy in a comprehensive inpatient rehab setting. Physiatrist is providing close team supervision and 24 hour management of active medical problems listed below. Physiatrist and rehab team continue to assess barriers to discharge/monitor patient progress toward functional and medical goals. Medically stable for D/C to SNF  FIM: FIM - Bathing Bathing Steps Patient Completed: Chest;Left Arm;Abdomen;Front perineal area;Right upper leg;Left upper leg Bathing: 3: Mod-Patient completes 5-7 77f 10 parts or 50-74%  FIM - Upper Body Dressing/Undressing Upper body dressing/undressing steps patient completed: Thread/unthread right sleeve of pullover  shirt/dresss;Thread/unthread left sleeve of pullover shirt/dress;Put head through opening of pull over shirt/dress;Pull shirt over trunk Upper body dressing/undressing: 4: Min-Patient completed 75 plus % of tasks FIM - Lower Body Dressing/Undressing Lower body dressing/undressing steps patient completed: Thread/unthread right pants leg Lower body dressing/undressing: 1: Two helpers (+2 for sit > stand to pull up pants)  FIM - Toileting Toileting Assistive Devices: Grab bar or rail for support Toileting: 1: Two helpers  FIM - Radio producer Devices: Grab bars Toilet Transfers: 2-To toilet/BSC: Max A (lift and lower assist);1-From toilet/BSC: Total A (helper does all/Pt. < 25%);1-Two helpers  FIM - Control and instrumentation engineer Devices: [place] Transfer: 5: Supine > Sit: Supervision (verbal cues/safety issues);5: Sit > Supine: Supervision (verbal cues/safety issues);3: Bed > Chair or W/C: Mod A (lift or lower assist);2: Chair or W/C > Bed: Max A (lift and lower assist)  FIM - Locomotion: Wheelchair Distance: 75 Locomotion: Wheelchair: 0: Activity did not occur FIM - Locomotion: Ambulation Locomotion: Ambulation Assistive Devices: Nutritional therapist Ambulation/Gait Assistance: 1: +2 Total assist;3: Mod assist Locomotion: Ambulation: 0: Activity did not occur  Comprehension Comprehension Mode: Auditory Comprehension: 5-Follows basic conversation/direction: With extra time/assistive device  Expression Expression Mode: Verbal Expression: 5-Expresses basic needs/ideas: With extra time/assistive device  Social Interaction Social Interaction: 5-Interacts appropriately 90% of the time - Needs monitoring or encouragement for participation or interaction.  Problem Solving Problem Solving: 5-Solves basic problems: With no assist  Memory Memory: 5-Requires cues to use assistive device  Medical Problem List and Plan:  1.  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 Latest cranial CT scan stable  2. DVT Prophylaxis/Anticoagulation: Subcutaneous heparin initiated for DVT prophylaxis 06/13/2014  3. Pain Management: Tylenol as needed. Monitor the increased mobility  4. Diabetes mellitus with peripheral neuropathy. Latest hemoglobin A1c 6.7. Check blood sugars a.c. and at bedtime.Tradjenta 5 mg daily.Just on Trajenta now, Patient on Levemir 15 units each bedtime and Onglyza 2.5 mg daily prior to admission.    -consider resuming levemir if sugars remain elevated 5. Neuropsych: This patient is not capable of making decisions on his own behalf.  6. Skin/Wound Care: Routine skin checks , R heel decubitus, order PRAFO, Left heel decubitus, cont PRAFO 7. Chronic renal insufficiency. Baseline creatinine 2.84. Followup chemistries show stable GFR ~20  -Cr 2.78 at baseline  -  monitor wts  -off IVF, lasix on hold, monitor  -continue to encourage fluids    -recheck bmet tomorrow 8. Chronic diastolic congestive heart failure. Monitor for any signs of fluid overload. Patient on Lasix 80 mg Daily.(held given #7) no peripheral edema 9. Hypertension. Norvasc 10 mg daily, Coreg 6.25 mg twice a day. Monitor with increased mobility  10. CAD with history of CABG. No chest pain or shortness of breath  11. Hyperlipidemia. Lipitor  12. GERD. Protonix 13.  Low grade temp will check UA  LOS (Days) 27 A FACE TO FACE EVALUATION WAS PERFORMED   [name] E 07/11/2014, 9:10 AM

## 2014-07-11 NOTE — Progress Notes (Signed)
Physical Therapy Discharge Summary  Patient Details  Name: [name] MRN: [id number] Date of Birth: [birth year]  Today's Date: 07/11/2014 PT Individual Time: 5732-2025 and 4270-6237 PT Individual Time Calculation (min): 45 min and 32 min    Patient has met 5 of 5 long term goals due to improved activity tolerance, improved balance, improved postural control, increased strength and improved awareness.  Patient to discharge at a wheelchair level [place].   Patient's care partner unavailable to provide the necessary physical and cognitive assistance at discharge; therefore, pt will received necessary physical and cognitive assistance at skilled nursing facility.  Reasons goals not met: N/A; all applicable goals met.  Recommendation:  Patient will benefit from ongoing skilled PT services in skilled nursing facility setting to continue to advance safe functional mobility, address ongoing impairments in midline orientation, postural stability, independence with functional mobility and to minimize fall risk.  Equipment: No equipment provided; necessary equipment will be provided at next venue of care.  Reasons for discharge: treatment goals met and discharge from hospital  Patient/family agrees with progress made and goals achieved: Yes  Skilled Therapeutic Interventions/Progress Updates: Treatment Session 1: Pt received seated in w/c; agreeable to therapy. Session focused on assessing/addressing pt independence with bed mobility, functional transfers, and w/c mobility. See D/C evaluation below for assistance/cueing required with all said aspects of functional mobility. Pt with significant point tenderness to palpation at L 5th metatarsal head. MD notified. Session ended in pt room, where pt was left seated in w/c with quick release belt in place for safety and all needs within reach.  Treatment Session 2: Pt received semi reclined in bed, eating lunch. Requesting to forgo therapy to  finish lunch.   Returned later in afternoon to make up missed time for earlier session. Pt received semi reclined in bed, asleep. Agreed to therapy with min coaxing. Session focused on assessing/addressing dynamic sitting balance. See discharge evaluation below for details. Pt with tearful episode during this session; reports sadness is due to D/C to SNF tomorrow. Provided pt with education concerning directing his care at SNF with focus on explaining to staff how to best assist with transfers. Pt later returned to bed with assist of RN who had not yet assisted this pt with transfers. Pt performed lateral scooting transfer from w/c>bed (to L side) with mod A of RN; pt initiated full anterior weight shift without cueing. Departed with pt semi reclined in bed with 3 rails up, bed alarm on, all needs within reach, and in no apparent distress.  PT Discharge Precautions/Restrictions Precautions Precautions: Fall Restrictions Weight Bearing Restrictions: No Vital Signs Therapy Vitals Temp: 99.7 F (37.6 C) Temp Source: Oral Pulse Rate: 55 Resp: 18 BP: 132/53 mmHg Patient Position (if appropriate): Lying Oxygen Therapy SpO2: 95 % O2 Device: None (Room air) Pain Pain Assessment Pain Assessment: No/denies pain Vision/Perception     Cognition Overall Cognitive Status: Impaired/Different from baseline Arousal/Alertness: Awake/alert Orientation Level: Oriented X4 Attention: Selective Selective Attention: Appears intact Memory: Impaired Memory Impairment: Decreased short term memory;Decreased recall of new information Decreased Short Term Memory: Verbal basic;Verbal complex Awareness: Impaired Awareness Impairment: Emergent impairment Problem Solving: Impaired Problem Solving Impairment: Verbal complex;Functional basic Executive Function: Organizing;Self Monitoring;Self Correcting Organizing: Impaired Organizing Impairment: Functional complex Self Monitoring: Impaired Self Monitoring  Impairment: Functional complex Self Correcting: Impaired Self Correcting Impairment: Functional complex Safety/Judgment: Impaired Comments: quick release belt for safety when seated in wheelchair  Sensation Sensation Light Touch: Appears Intact Hot/Cold: Appears Intact Proprioception: Appears Intact  Coordination Gross Motor Movements are Fluid and Coordinated: No Fine Motor Movements are Fluid and Coordinated: No Heel Shin Test: Coordination improved on RLE as compared with evaluation; LLE limited by weakness Motor  Motor Motor: Hemiplegia;Abnormal postural alignment and control Motor - Discharge Observations: L hemiplegia; mild ataxia in RLE  Mobility Bed Mobility Bed Mobility: Rolling Right;Rolling Left;Sitting - Scoot to Edge of Bed;Supine to Sit;Sit to Supine;Scooting to [facility] Rolling Right: 6: Modified independent (Device/Increase time);With rail Rolling Left: 5: Supervision;With rail Supine to Sit: 5: Supervision;HOB flat;With rails Sitting - Scoot to Edge of Bed: 5: Supervision Sitting - Scoot to [employer] of Bed Details: Tactile cues for weight shifting;Verbal cues for technique Sit to Supine: 5: Supervision;HOB flat Scooting to HOB: With rail;4: Min assist;5: Set up Scooting to [facility] Details: Tactile cues for weight bearing;Verbal cues for technique Scooting to [facility] Details (indicate cue type and reason): Tactile cueing at LLE for weightbearing Transfers Transfers: Yes Squat Pivot Transfers: 3: Mod assist;Without upper extremity assistance Squat Pivot Transfer Details: Tactile cues for weight shifting;Verbal cues for technique;Verbal cues for sequencing Lateral/Scoot Transfers: 3: Mod assist;With armrests removed Lateral/Scoot Transfer Details: Tactile cues for weight shifting;Verbal cues for technique;Verbal cues for sequencing Locomotion  Ambulation Ambulation: No (Unable due to pain in L foot with weightbearing) Gait Gait: No Stairs / Additional Locomotion Stairs: No   Trunk/Postural Assessment  Cervical Assessment Cervical Assessment: Exceptions to [facility] (forward head when seated at rest; able to self-correct with min cueing) Thoracic Assessment Thoracic Assessment: Exceptions to [facility] (thoracic kyphosis; able to self-correct with min cueing) Lumbar Assessment Lumbar Assessment: Within Functional Limits Postural Control Postural Control: Deficits on evaluation Trunk Control: Tendency toward LOB to L side (with effective-self recovery) with RUE reaching across midline  Balance Balance Balance Assessed: Yes Static Sitting Balance Static Sitting - Balance Support: Feet supported;Right upper extremity supported Static Sitting - Level of Assistance: 6: Modified independent (Device/Increase time) Static Sitting - Comment/# of Minutes: Static sitting EOB x5 minutes during education; no overt LOB. Dynamic Sitting Balance Dynamic Sitting - Balance Support: No upper extremity supported;Feet supported;During functional activity Dynamic Sitting - Level of Assistance: 5: Stand by assistance Dynamic Sitting - Balance Activities: Forward lean/weight shifting;Lateral lean/weight shifting;Reaching for objects;Reaching across midline Sitting balance - Comments: With RUE reaching across midline, pt with slight LOB to L side with effective self-recovery. Extremity Assessment  RLE Assessment RLE Assessment: Within Functional Limits LLE Assessment LLE Assessment: Exceptions to [facility] LLE Strength LLE Overall Strength: Deficits Left Hip Flexion: 3+/5 Left Knee Flexion: 2+/5 Left Knee Extension: 3-/5 Left Ankle Dorsiflexion: 3-/5 Left Ankle Plantar Flexion: 2-/5 LLE Tone LLE Tone: Within Functional Limits  See FIM for current functional status  Hobble, Malva Cogan 07/11/2014, 9:46 PM

## 2014-07-11 NOTE — Patient Care Conference (Signed)
Inpatient RehabilitationTeam Conference and Plan of Care Update Date: 07/10/2014   Time: 10:30 AM    Patient Name: [name]      Medical Record Number: [id number]  Date of Birth: May 06, 1931 Sex: Male         Room/Bed: 4W23C/4W23C-01 Payor Info: Payor: [employer] MEDICARE / Plan: [place] MEDICARE HMO/PPO / Product Type: *No Product type* /    Admitting Diagnosis: R CVA  Admit Date/Time:  06/14/2014  5:42 PM Admission Comments: No comment available   Primary Diagnosis:  <principal problem not specified> Principal Problem: <principal problem not specified>  Patient Active Problem List   Diagnosis Date Noted  . Spastic hemiparesis affecting nondominant side 07/02/2014  . ICH (intracerebral hemorrhage) 06/14/2014  . Stroke due to intracerebral hemorrhage 06/11/2014  . Atrial fibrillation 11/20/2013  . Chronic diastolic congestive heart failure 11/20/2013  . Atrial flutter 06/25/2013  . Long term (current) use of anticoagulants 06/25/2013  . CKD (chronic kidney disease) stage 5, GFR less than 15 ml/min 08/29/2012  . Pulmonary arterial hypertension 07/29/2012  . Anemia, iron deficiency 01/25/2012  . AV block, Mobitz 1 01/10/2012  . CARDIOMYOPATHY, ISCHEMIC 10/22/2010  . HEADACHE 01/23/2009  . DIABETIC PERIPHERAL NEUROPATHY 07/24/2008  . ABDOMINAL PAIN, RIGHT UPPER QUADRANT 10/23/2007  . DIABETES MELLITUS 06/24/2007  . HYPERLIPIDEMIA 06/24/2007  . CORONARY ARTERY DISEASE 06/24/2007  . OSTEOARTHRITIS 06/24/2007  . CEREBROVASCULAR DISEASE, HX OF 06/24/2007  . STATUS, OTHER TOE(S) AMPUTATION 06/24/2007  . HYPERTENSION 05/13/2007  . CORONARY ARTERY BYPASS GRAFT, HX OF 11/25/1994    Expected Discharge Date: Expected Discharge Date: 07/12/14  Team Members Present: Physician leading conference: Dr. Alysia Penna Social Worker Present: Alfonse Alpers, LCSW;Becky DuPree, LCSW Nurse Present: Heather Roberts, RN PT Present: Raylene Everts, PT;Caroline Lacinda Axon, PT;Blair Hobble, PT OT Present:  Simonne Come, OT SLP Present: [name], SLP PPS Coordinator present : Ileana Ladd, PT     Current Status/Progress Goal Weekly Team Focus  Medical   doing better with timed toileting  prepare for NHP  Enc timed toileting   Bowel/Bladder   Pt continent of bowel and bladder with timed toileting inplace. Pt starting to initate staff for assistance for bathroom. No condom cath worn 10-13 HS- cont. of bladder with use of urinal  min assist for toiletng, no condom cath  contiune timed tolieting and encourage use of toilet and urinal   Swallow/Nutrition/ Hydration   intermittent supervision, regular/thin   mod I with least restrictive diet   continued diet tolerance   ADL's   supervision-min assist unsupported sitting, LB bathing and dressing at bed level to decrease burden of care, Max assist squat pivot transfer, total assist LB dressing, max assist UB dressing   downgraded to mod assist bathing and max assist LB dressing and transfers  bed mobility, LUE NMR, sit <> stand, transfers, attention to tasks   Mobility   Supervision-Mod A bed mobility, Mod to Max A transfers, Min A w/c mobility  Downgraded to Min A bed mobiliy and w/c mobility and Mod A transfers; discharged ambulation goals  Bed mobility, functional transfers, L NMR, midline orientation, postural control   Communication   min assist for use of dysarthria strategies at the phrase level   supervision   carryover of dysarthria strategies in conversation   Safety/Cognition/ Behavioral Observations  min assist   supervision   memory, self monitoring, attention, awareness, problem solving   Pain   no complaints of pain         Skin  skin tears x2 to scrotum healing.  Deep tissue injury to left heel.  no further skin injury/breakdown with min. assist  assess skin q shift; use prafos HS for pressure relief    Rehab Goals Patient on target to meet rehab goals: Yes Rehab Goals Revised: PT d/c'd ambulation goal. *See Care Plan and  progress notes for long and short-term goals.  Barriers to Discharge: no 24/7 assist    Possible Resolutions to Barriers:  SNF    Discharge Planning/Teaching Needs:  Pt is aware that he will need more physical rehab when he leaves CIR and that he is going to require more physical care by professionals than could be provided at home.  Pt has agreed to SNF transfer from CIR before he returns to his home.  None - plan is for SNF now   Team Discussion:  Pt is stable for transfer to SNF.  He has made slow progress with therapies, but has been more consistent with transfers in the last couple of days, with some transfers being at mod assist.  Therapists are more hopeful with seeing pt's progress recently and are glad pt has chosen to continue his rehab at the SNF.  Revisions to Treatment Plan:  Pt's ambulation goal is d/c'd for now.   Continued Need for Acute Rehabilitation Level of Care: The patient requires daily medical management by a physician with specialized training in physical medicine and rehabilitation for the following conditions: Daily direction of a multidisciplinary physical rehabilitation program to ensure safe treatment while eliciting the highest outcome that is of practical value to the patient.: Yes Daily medical management of patient stability for increased activity during participation in an intensive rehabilitation regime.: Yes Daily analysis of laboratory values and/or radiology reports with any subsequent need for medication adjustment of medical intervention for : Neurological problems  Shelden Raborn, Silvestre Mesi 07/11/2014, 9:08 AM

## 2014-07-11 NOTE — Progress Notes (Signed)
Social Work Patient ID: [name], male   DOB: 1931-08-25, 80 y.o.   MRN: [id number]  CSW spoke with pt to update him on team conference.  Pt is accepting of transfer to SNF, as he realizes he needs to continue his therapy and get stronger so he can be more independent and family/friends can help him at home.  Pt wanted to make sure that he would receive PT/OT/ST at the SNF and CSW confirmed with him he would.  He was appreciative.  Pt wanted to tell his family about his move to SNF, so CSW allowed pt time to do that.  CSW now left son a message to confirm with him that pt informed him of his d/c plan.  Await call back from son and insurance approval for pt to transfer to SNF tomorrow.

## 2014-07-11 NOTE — Discharge Summary (Signed)
Discharge summary job [id number]

## 2014-07-11 NOTE — Progress Notes (Signed)
Social Work Discharge Note  The overall goal for the admission was met for:   Discharge location: No - pt's plan changed to SNF due to not having assistance needed at home  Length of Stay: Yes - 28 days  Discharge activity level: No - pt's original goals were set for minimal assistance, but he requires mod to max assistance with most tasks  Home/community participation: No - pt's plan changed to SNF  Services provided included: MD, RD, PT, OT, SLP, RN, Pharmacy and [place]: Other: [employer]  Follow-up services arranged: Other: Pt transferred to [facility] for SNF care.  Comments (or additional information):  Pt transferred to SNF via ambulance to continue his rehabilitation.  Patient/Family verbalized understanding of follow-up arrangements: Yes  Individual responsible for coordination of the follow-up plan: SNF  Confirmed correct DME delivered: Prevatt, Silvestre Mesi 07/11/2014    Prevatt, Silvestre Mesi

## 2014-07-11 NOTE — Progress Notes (Signed)
Occupational Therapy Session Note  Patient Details  Name: [name] MRN: [id number] Date of Birth: 02-Sep-1931  Today's Date: 07/11/2014 OT Individual Time: 0623-7628 OT Individual Time Calculation (min): 62 min    Short Term Goals: Week 4:  OT Short Term Goal 1 (Week 4): STG = LTGs due to remaining LOS  Skilled Therapeutic Interventions/Progress Updates:    Engaged in ADL retraining with focus on carryover of education and hemi-technique with bathing and dressing, transfers, and LUE NMR.  Pt in bed upon arrival, engaged in LB bathing and dressing at bed level with pt rolling Rt and Lt with supervision and use of bed rails for therapist to wash buttocks and don brief and pt completed perineal hygiene with setup.  Pt able to bridge hips x3 this session to enable therapist to pull pants over hips at bed level, pt reports preferring bridging to rolling as used previously.  Mod assist squat pivot transfer to w/c on Lt this session with pt demonstrating increased carryover of anterior weight shift and cues provided from previous sessions.  Pt completed UB bathing and dressing seated in front of sink.  Utilized hand over hand assist to incorporate LUE into bathing.  Pt able to don shirt this session with only setup and min-mod verbal cues.  Pt noted to be much more engaged in session today and continuously reports "I will try" when attempting various tasks.  Therapy Documentation Precautions:  Precautions Precautions: Fall Restrictions Weight Bearing Restrictions: No General:   Vital Signs: Therapy Vitals Temp: 99.7 F (37.6 C) Temp Source: Oral Pulse Rate: 55 Resp: 18 BP: 132/53 mmHg Patient Position (if appropriate): Lying Oxygen Therapy SpO2: 95 % O2 Device: None (Room air) Pain:  Pt with c/o pain on Lt lateral aspect of foot, RN aware  See FIM for current functional status  Therapy/Group: Individual Therapy  Simonne Come 07/11/2014, 9:31 AM

## 2014-07-11 NOTE — Progress Notes (Signed)
Speech Language Pathology Discharge Summary  Patient Details  Name: [name] MRN: [id number] Date of Birth: 11-Dec-1930  Today's Date: 07/11/2014 SLP Individual Time: 0930-1015 SLP Individual Time Calculation (min): 45 min   Skilled Therapeutic Interventions:   Pt was seen for skilled speech therapy targeting cognitive-linguistic goals.  Pt was awake, alert, and agreeable to participate in ST upon arrival.  SLP provided pt with min cues for use of loud voice to make needs/wants as pt was noted to use gestures and pointing to communicate at the beginning of today's session.  Pt able to recall the saying "If you don't use it, you lose it " as a memory aid to facilitate improved use of dysarthria strategies across therapy sessions with min sentence completion cues.  SLP also facilitated the session with min-mod cues for thought organization and working memory during a structured new learning activity targeting functional problem solving and numerical reasoning.  Question pt's baseline numerical reasoning.  SLP provided min assist faded to supervision cues for use of dysarthria strategies during a structured picture description task for >80% intelligibility in context.      Patient has met 5 of 5 long term goals.  Patient to discharge at [facility] level.  Reasons goals not met: n/a   Clinical Impression/Discharge Summary:  Pt made functional gains while inpatient and is discharging having met 5 out of 5 long term goals due to improved use of dysarthria strategies, basic problem solving, selective attention, memory, and awareness of deficits.  Pt is  tolerating his currently prescribed diet of regular solids and thin liquids with mod I for use of swallowing precautions (no straws, small bites/sips) to minimize overt s/s of aspiration.  Currently pt requires overall min assist for cognitive-linguistic task due to decreased working memory, thought organization, and error awareness.  Pt is  discharging to SNF where it is recommended that he receive brief follow up ST in order to maximize functional independence and reduce burden of care upon discharge.    Care Partner:  Caregiver Able to Provide Assistance: Other (comment) (SNF)     Recommendation:  24 hour supervision/assistance;Skilled Nursing facility  Rationale for SLP Follow Up: Maximize functional communication;Maximize cognitive function and independence;Reduce caregiver burden   Equipment: none recommended by SLP    Reasons for discharge: Discharged from hospital   Patient/Family Agrees with Progress Made and Goals Achieved: Yes   See FIM for current functional status  [name], M.A. CCC-SLP  Lorrain Rivers, Selinda Orion 07/11/2014, 12:54 PM

## 2014-07-11 NOTE — Progress Notes (Signed)
Occupational Therapy Discharge Summary  Patient Details  Name: [name] MRN: [id number] Date of Birth: May 31, 1931   Patient has met 6 of 10 long term goals due to improved activity tolerance, improved balance, postural control and improved awareness.  Patient to discharge at [facility] Max Assist level.  Patient's care partner unavailable to provide the necessary physical and cognitive assistance at discharge.  Pt transferring to SNF to allow for increased time and continued therapy until he is at a level that his family and friends can provide assistance.  Reasons goals not met: Pt continues to require +2 with any standing tasks (LB and toileting tasks) due to decreased standing balance, pushing tendency, and impaired motor planning and delayed processing.  Recommendation:  Patient will benefit from ongoing skilled OT services in skilled nursing facility setting to continue to advance functional skills in the area of BADL and Reduce care partner burden.  Equipment: No equipment provided  Reasons for discharge: treatment goals met and discharge from hospital  Patient/family agrees with progress made and goals achieved: Yes  OT Discharge Precautions/Restrictions  Precautions Precautions: Fall Restrictions Weight Bearing Restrictions: No General   Vital Signs Therapy Vitals Temp: 97.6 F (36.4 C) Temp Source: Oral Pulse Rate: 48 Resp: 18 BP: 147/54 mmHg Patient Position (if appropriate): Lying Oxygen Therapy SpO2: 100 % O2 Device: None (Room air) Pain Pain Assessment Pain Assessment: No/denies pain ADL  See FIM Vision/Perception  Vision- History Baseline Vision/History: Wears glasses Patient Visual Report: No change from baseline Vision- Assessment Vision Assessment?: Yes Alignment/Gaze Preference: Gaze right Saccades: Additional head turns occurred during testing  Cognition Overall Cognitive Status: Impaired/Different from baseline Arousal/Alertness:  Awake/alert Orientation Level: Oriented X4 Attention: Selective Selective Attention: Appears intact Memory: Impaired Memory Impairment: Decreased short term memory;Decreased recall of new information Decreased Short Term Memory: Verbal basic;Verbal complex Awareness: Impaired Awareness Impairment: Emergent impairment Problem Solving: Impaired Problem Solving Impairment: Verbal complex;Functional basic Executive Function: Organizing;Self Monitoring;Self Correcting Organizing: Impaired Organizing Impairment: Functional complex Self Monitoring: Impaired Self Monitoring Impairment: Functional complex Self Correcting: Impaired Self Correcting Impairment: Functional complex Safety/Judgment: Impaired Comments: quick release belt for safety when seated in wheelchair  Sensation Sensation Light Touch: Appears Intact Hot/Cold: Appears Intact Proprioception: Appears Intact Coordination Gross Motor Movements are Fluid and Coordinated: No Fine Motor Movements are Fluid and Coordinated: No  Balance Balance Balance Assessed: Yes Static Sitting Balance Static Sitting - Balance Support: Feet supported;Right upper extremity supported Static Sitting - Level of Assistance: 6: Modified independent (Device/Increase time) Static Sitting - Comment/# of Minutes: Static sitting EOB x5 minutes during education; no overt LOB. Dynamic Sitting Balance Dynamic Sitting - Balance Support: No upper extremity supported;Feet supported;During functional activity Dynamic Sitting - Level of Assistance: 5: Stand by assistance Dynamic Sitting - Balance Activities: Forward lean/weight shifting;Lateral lean/weight shifting;Reaching for objects;Reaching across midline Sitting balance - Comments: With RUE reaching across midline, pt with slight LOB to L side with effective self-recovery. Extremity/Trunk Assessment RUE Assessment RUE Assessment: Within Functional Limits LUE Assessment LUE Assessment: Exceptions to [facility]  (Pt with increased LUE AAROM, shoulder elevation, flexion/extension against minimal resistance and minimal abduction with gravity eliminated, also developing gross grasp)  See FIM for current functional status  Mildreth Reek, [facility] 07/11/2014, 3:21 PM

## 2014-07-11 NOTE — Discharge Summary (Signed)
NAME:  [name], [name] NO.:  [id number]  MEDICAL RECORD NO.:  [id number]  LOCATION:                                 FACILITY:  PHYSICIAN:  Charlett Blake, M.D.DATE OF BIRTH:  [birth year]  DATE OF ADMISSION:  06/13/2014 DATE OF DISCHARGE:  07/12/2014                              DISCHARGE SUMMARY   DISCHARGE DIAGNOSES: 1. Functional deficit secondary to right pontomedullary intracerebral     hemorrhage consistent with primary hypertension. 2. Subcutaneous heparin for deep vein thrombosis prophylaxis. 3. Diabetes mellitus with peripheral neuropathy. 4. Chronic renal insufficiency with baseline creatinine 2.84. 5. Chronic diastolic congestive heart failure. 6. Hypertension. 7. Coronary artery disease with coronary artery bypass graft. 8. Hyperlipidemia. 9. Gastroesophageal reflux disease.  HISTORY OF PRESENT ILLNESS:  This is an 80 year old right-handed male multi-medical with history of diabetes mellitus, coronary artery disease, atrial flutter on chronic Coumadin as well as chronic renal insufficiency, and diastolic congestive heart failure.  The patient independent with occasional cane and driving, living with family prior to admission.  Admitted June 11, 2014, with left-sided weakness and slurred speech as well as recent fall but denied striking his head. Cranial CT scan showed a right pontomedullary 14 x 22 mm oval focus consistent with acute hemorrhage.  INR on admission of 2.10.  Systolic blood pressure 735.  Placed on intravenous Lopressor.  His Coumadin was reversed.  Follow up cranial CT scan unchanged.  Neurology consulted. Findings consistent with primary hypertensive ICH.  Tolerating a regular diet.  Chronic Coumadin remains on hold.  Subcutaneous heparin for DVT prophylaxis.  Physical and occupational therapy ongoing.  The patient was admitted for comprehensive rehab program.  PAST MEDICAL HISTORY:  See discharge diagnoses.  SOCIAL  HISTORY:  Lives with family.  FUNCTIONAL HISTORY:  Prior to admission, independent with occasional cane.  Functional status upon admission to rehab services was needs assist for overall bed mobility, verbal cues, mod-max assist activities of daily living.  PHYSICAL EXAMINATION:  VITAL SIGNS:  Blood pressure 151/62, pulse 60, temperature 98.6, respirations 18. GENERAL:  This was an alert male, poor dentition.  Speech dysarthric but intelligible.  He makes good eye contact with examiner.  He could provide his age and date of birth.  Followed simple commands. LUNGS:  Clear to auscultation. CARDIAC:  Rate controlled. ABDOMEN:  Soft, nontender.  Good bowel sounds.  REHABILITATION HOSPITAL COURSE:  The patient was admitted to inpatient rehab services with therapies initiated on a 3-hour daily basis consisting of physical therapy, occupational therapy, speech therapy, and rehabilitation nursing.  The following issues were addressed during the patient's rehabilitation stay.  Pertaining to Mr. Murrell right pontomedullary ICH consistent with primary hypertension remained stable. Latest cranial CT scan without change.  He would follow up with Neurology Services.  Subcutaneous heparin had been initiated for DVT prophylaxis, June 13, 2014, showing no bleeding episodes.  Diabetes mellitus, peripheral neuropathy.  Hemoglobin A1c of 6.7, maintained on Tradjenta 5 mg daily as well as NovoLog 3 units t.i.d.  Blood sugars within acceptable limits.  Blood pressures monitored closely on Norvasc as well as Coreg, with no orthostatic changes.  He did have a history of chronic renal insufficiency,  baseline creatinine 2.78, 2.84 and latest creatinine 2.78 and monitored closely.  He remained on Lipitor for hyperlipidemia.  He exhibited no signs of fluid overload.  His Lasix had remained on hold due to chronic renal insufficiency.  He was showing no peripheral edema.  No other signs of fluid overload.   No chest pain or shortness of breath.  The patient received weekly collaborative interdisciplinary team conferences to discuss estimated length of stay, family teaching, and any barriers to his discharge.  He exhibited impaired timing in sequencing, unbalanced muscle activation with ataxia, decreased coordination and motor planning.  He exhibited decreased midline orientation, decreased initiation as well as memory and delayed processing.  Required hand over hand to stabilize even his toothpaste for activities of daily living.  He did engage in donning his pants, seated in a wheelchair, +2 assist to pull his pants over his hips, perform supine to sit x2 requiring supervision and increased time.  Bed to chair using sliding board with moderate assist.  The patient with slow but progressive gains, it was felt by family they could not provide the necessary assistance at home with request for skilled nursing facility, bed becoming available on July 12, 2014.  DISCHARGE MEDICATIONS:  Included: 1. Norvasc 10 mg p.o. daily. 2. Lipitor 20 mg p.o. daily. 3. Coreg 6.25 mg p.o. b.i.d. 4. Tradjenta 5 mg p.o. daily. 5. Protonix 40 mg p.o. daily. 6. MiraLAX 17 g p.o. daily, hold for loose stools.  DIET:  Diabetic 2000 calorie ADA diet.  FOLLOW UP:  The patient would follow up with Dr. Alysia Penna at the outpatient rehab center, August 12, 2014.  Dr. Rosalin Hawking, Neurology Services, 1 month, call for appointment.  Dr. Vertell Novak, medical management.     Lauraine Rinne, P.A.   ______________________________ Charlett Blake, M.D.   DA/MEDQ  D:  07/11/2014  T:  07/11/2014  Job:  [id number]  cc:   Heinz Knuckles. [place], MD Dr. Rosalin Hawking

## 2014-07-12 LAB — GLUCOSE, CAPILLARY
GLUCOSE-CAPILLARY: 92 mg/dL (ref 70–99)
Glucose-Capillary: 130 mg/dL — ABNORMAL HIGH (ref 70–99)

## 2014-07-12 NOTE — Progress Notes (Signed)
Late entry: Pt transferred to [facility] by EMT. Medical packet provided to transporter. Attempted to call report to receiving facility. Call was transferred, and no one answered the phone. Blood glucose covered with sliding scale insulin prior to transfer. Personal belonging packed up and transported to facility by pt's daughter.

## 2014-07-12 NOTE — Progress Notes (Signed)
Subjective/Complaints: Left foot xray without acute injury No pain at rest but still complains of lateral border foot pain with activity or pressure  Review of Systems - Negative except weak on left  Objective: Vital Signs: Blood pressure 139/55, pulse 54, temperature 98 F (36.7 C), temperature source Oral, resp. rate 17, weight 81.602 kg (179 lb 14.4 oz), SpO2 97.00%. Dg Foot 2 Views Left  07/11/2014   CLINICAL DATA:  Diabetes mellitus presenting with acute foot pain, generalized  EXAM: LEFT FOOT - 2 VIEW  COMPARISON:  None.  FINDINGS: Frontal and lateral views were obtained. 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 There is osteoarthritic change in the first MTP joint as well as in all PIP joints. There is no erosive change or bony destruction. No fracture or dislocation. There is extensive vascular calcification. There are small posterior and inferior calcaneal spurs. Bones are somewhat osteoporotic.  IMPRESSION: Prior amputations as noted above. Bones osteoporotic. Areas of osteoarthritic change. No bony destruction or erosion. Widespread arterial vascular calcification consistent with diabetes mellitus.   Electronically Signed   By: Lowella Grip M.D.   On: 07/11/2014 12:27   Results for orders placed during the hospital encounter of 06/14/14 (from the past 72 hour(s))  GLUCOSE, CAPILLARY     Status: Abnormal   Collection Time    07/09/14 11:15 AM      Result Value Ref Range   Glucose-Capillary 152 (*) 70 - 99 mg/dL   Comment 1 Notify RN    GLUCOSE, CAPILLARY     Status: Abnormal   Collection Time    07/09/14  4:31 PM      Result Value Ref Range   Glucose-Capillary 146 (*) 70 - 99 mg/dL  GLUCOSE, CAPILLARY     Status: Abnormal   Collection Time    07/09/14  9:19 PM      Result Value Ref Range   Glucose-Capillary 116 (*) 70 - 99 mg/dL  GLUCOSE, CAPILLARY     Status: None   Collection Time    07/10/14  7:20 AM      Result  Value Ref Range   Glucose-Capillary 86  70 - 99 mg/dL   Comment 1 Notify RN    GLUCOSE, CAPILLARY     Status: Abnormal   Collection Time    07/10/14 11:17 AM      Result Value Ref Range   Glucose-Capillary 176 (*) 70 - 99 mg/dL   Comment 1 Notify RN    GLUCOSE, CAPILLARY     Status: None   Collection Time    07/10/14  4:52 PM      Result Value Ref Range   Glucose-Capillary 99  70 - 99 mg/dL  GLUCOSE, CAPILLARY     Status: Abnormal   Collection Time    07/10/14  9:08 PM      Result Value Ref Range   Glucose-Capillary 155 (*) 70 - 99 mg/dL  GLUCOSE, CAPILLARY     Status: None   Collection Time    07/11/14  7:24 AM      Result Value Ref Range   Glucose-Capillary 98  70 - 99 mg/dL   Comment 1 Notify RN    GLUCOSE, CAPILLARY     Status: Abnormal   Collection Time    07/11/14 11:17 AM      Result Value Ref Range   Glucose-Capillary 137 (*) 70 - 99 mg/dL   Comment 1 Notify  RN    GLUCOSE, CAPILLARY     Status: Abnormal   Collection Time    07/11/14  4:33 PM      Result Value Ref Range   Glucose-Capillary 137 (*) 70 - 99 mg/dL  GLUCOSE, CAPILLARY     Status: Abnormal   Collection Time    07/11/14  9:14 PM      Result Value Ref Range   Glucose-Capillary 122 (*) 70 - 99 mg/dL  GLUCOSE, CAPILLARY     Status: None   Collection Time    07/12/14  7:08 AM      Result Value Ref Range   Glucose-Capillary 92  70 - 99 mg/dL   Comment 1 Notify RN       HEENT: normal Cardio: RRR and no murmur Resp: CTA B/L and unlabored GI: BS positive and NT, ND Extremity:  No Edema Skin:   Right heel ulcer  Neuro: Lethargic, Flat, Cranial Nerve Abnormalities Left central 7, Abnormal Sensory decreased in both feet, Abnormal Motor bilateral hand intrinsic atrophy and  motor 2-/5 Left bi, tri grip, 3- Left HF, KE , 2- ankle DF Musc/Skel:  Other no pain with AROM in BUE and BLE, or with neck movement, sitting balance fair minus Left shoulder sublux Lateral foot tenderness Left even with light  palpation, foot warm but with reduced pedal pulses, no skin breakdown Gen NAD  Tone triple flexor response LLE  Assessment/Plan: 1. Functional deficits secondary to RIght pontomedullary hemorrhage with Left hemiparesis superimposed on severe diabetic neuropathy which require 3+ hours per day of interdisciplinary therapy in a comprehensive inpatient rehab setting. Physiatrist is providing close team supervision and 24 hour management of active medical problems listed below. Physiatrist and rehab team continue to assess barriers to discharge/monitor patient progress toward functional and medical goals. Medically stable for D/C to SNF  FIM: FIM - Bathing Bathing Steps Patient Completed: Chest;Left Arm;Abdomen;Front perineal area;Right upper leg;Left upper leg Bathing: 3: Mod-Patient completes 5-7 8f 10 parts or 50-74%  FIM - Upper Body Dressing/Undressing Upper body dressing/undressing steps patient completed: Thread/unthread right sleeve of pullover shirt/dresss;Thread/unthread left sleeve of pullover shirt/dress;Put head through opening of pull over shirt/dress;Pull shirt over trunk Upper body dressing/undressing: 5: Supervision: Safety issues/verbal cues FIM - Lower Body Dressing/Undressing Lower body dressing/undressing steps patient completed: Thread/unthread right pants leg Lower body dressing/undressing: 1: Total-Patient completed less than 25% of tasks  FIM - Musician Devices: Grab bar or rail for support Toileting: 1: Two helpers  FIM - Radio producer Devices: Product manager Transfers: 1-Two helpers  FIM - Control and instrumentation engineer Devices: Bed rails Bed/Chair Transfer: 5: Supine > Sit: Supervision (verbal cues/safety issues);3: Bed > Chair or W/C: Mod A (lift or lower assist)  FIM - Locomotion: Wheelchair Distance: 75 Locomotion: Wheelchair: 0: Activity did not occur FIM - Locomotion:  Ambulation Locomotion: Ambulation Assistive Devices: Nutritional therapist Ambulation/Gait Assistance: 1: +2 Total assist;3: Mod assist Locomotion: Ambulation: 0: Activity did not occur  Comprehension Comprehension Mode: Auditory Comprehension: 5-Follows basic conversation/direction: With no assist  Expression Expression Mode: Verbal Expression: 4-Expresses basic 75 - 89% of the time/requires cueing 10 - 24% of the time. Needs helper to occlude trach/needs to repeat words.  Social Interaction Social Interaction: 5-Interacts appropriately 90% of the time - Needs monitoring or encouragement for participation or interaction.  Problem Solving Problem Solving: 4-Solves basic 75 - 89% of the time/requires cueing 10 - 24% of the time  Memory Memory: 4-Recognizes  or recalls 75 - 89% of the time/requires cueing 10 - 24% of the time  Medical Problem List and Plan:  1. 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 Latest cranial CT scan stable  2. DVT Prophylaxis/Anticoagulation: Subcutaneous heparin initiated for DVT prophylaxis 06/13/2014  3. Pain Management: Tylenol as needed. Monitor the increased mobility  4. Diabetes mellitus with peripheral neuropathy. Latest hemoglobin A1c 6.7. Check blood sugars a.c. and at bedtime.Tradjenta 5 mg daily.Just on Trajenta now, Patient on Levemir 15 units each bedtime and Onglyza 2.5 mg daily prior to admission.    -consider resuming levemir if sugars remain elevated 5. Neuropsych: This patient is not capable of making decisions on his own behalf.  6. Skin/Wound Care: Routine skin checks , R heel decubitus, order PRAFO, Left heel decubitus, cont PRAFO 7. Chronic renal insufficiency. Baseline creatinine 2.84. Followup chemistries show stable GFR ~20  -Cr 2.78 at baseline  -monitor wts  -off IVF, lasix on hold, monitor  -continue to encourage fluids    -recheck bmet tomorrow 8. Chronic diastolic congestive heart  failure. Monitor for any signs of fluid overload. Patient on Lasix 80 mg Daily.(held given #7) no peripheral edema 9. Hypertension. Norvasc 10 mg daily, Coreg 6.25 mg twice a day. Monitor with increased mobility  10. CAD with history of CABG. No chest pain or shortness of breath  11. Hyperlipidemia. Lipitor  12. GERD. Protonix 13.  Low grade temp x 1 no  UA needed  LOS (Days) 28 A FACE TO FACE EVALUATION WAS PERFORMED   [name] 07/12/2014, 7:53 AM

## 2014-07-12 NOTE — Progress Notes (Signed)
Social Work Patient ID: [name], male   DOB: Feb 07, 1931, 80 y.o.   MRN: [id number] Met with pt and daughter awaiting other daughter arrival.  Pt wants a private room and wanted this worker to make sure he had one At [facility].  Have contacted the facility and he will have a private room, this satisfies him and he is ready to go once daughter gets here. All agreeable to transfer to [facility] today.

## 2014-07-22 ENCOUNTER — Ambulatory Visit: Payer: Medicare HMO | Admitting: Internal Medicine

## 2014-07-22 DIAGNOSIS — Z0289 Encounter for other administrative examinations: Secondary | ICD-10-CM

## 2014-07-31 ENCOUNTER — Ambulatory Visit (HOSPITAL_COMMUNITY)
Admission: RE | Admit: 2014-07-31 | Discharge: 2014-07-31 | Disposition: A | Discharge status: Home / Self Care | Payer: Medicare HMO | Source: Ambulatory Visit | Attending: Nephrology | Admitting: Nephrology

## 2014-07-31 DIAGNOSIS — D631 Anemia in chronic kidney disease: Secondary | ICD-10-CM | POA: Diagnosis present

## 2014-07-31 DIAGNOSIS — N184 Chronic kidney disease, stage 4 (severe): Secondary | ICD-10-CM | POA: Diagnosis not present

## 2014-07-31 LAB — RENAL FUNCTION PANEL
ANION GAP: 14 (ref 5–15)
Albumin: 3.3 g/dL — ABNORMAL LOW (ref 3.5–5.2)
BUN: 49 mg/dL — ABNORMAL HIGH (ref 6–23)
CALCIUM: 9.8 mg/dL (ref 8.4–10.5)
CO2: 20 mEq/L (ref 19–32)
Chloride: 102 mEq/L (ref 96–112)
Creatinine, Ser: 3.02 mg/dL — ABNORMAL HIGH (ref 0.50–1.35)
GFR calc Af Amer: 21 mL/min — ABNORMAL LOW (ref >90)
GFR calc non Af Amer: 18 mL/min — ABNORMAL LOW (ref >90)
GLUCOSE: 131 mg/dL — AB (ref 70–99)
Phosphorus: 3.4 mg/dL (ref 2.3–4.6)
Potassium: 4.7 mEq/L (ref 3.7–5.3)
Sodium: 136 mEq/L — ABNORMAL LOW (ref 137–147)

## 2014-07-31 LAB — IRON AND TIBC
IRON: 36 ug/dL — AB (ref 42–135)
SATURATION RATIOS: 19 % — AB (ref 20–55)
TIBC: 193 ug/dL — ABNORMAL LOW (ref 215–435)
UIBC: 157 ug/dL (ref 125–400)

## 2014-07-31 LAB — FERRITIN: Ferritin: 841 ng/mL — ABNORMAL HIGH (ref 22–322)

## 2014-07-31 LAB — POCT HEMOGLOBIN-HEMACUE: Hemoglobin: 9 g/dL — ABNORMAL LOW (ref 13.0–17.0)

## 2014-07-31 MED ORDER — EPOETIN ALFA 10000 UNIT/ML IJ SOLN
10000.0000 [IU] | INTRAMUSCULAR | Status: DC
  Administered 2014-07-31: 10000 [IU] via SUBCUTANEOUS

## 2014-07-31 MED ORDER — EPOETIN ALFA 10000 UNIT/ML IJ SOLN
INTRAMUSCULAR | Status: DC
Start: 2014-07-31 — End: 2014-08-01
  Filled 2014-07-31: qty 1

## 2014-08-12 ENCOUNTER — Ambulatory Visit (HOSPITAL_BASED_OUTPATIENT_CLINIC_OR_DEPARTMENT_OTHER): Payer: Medicare HMO | Admitting: Physical Medicine & Rehabilitation

## 2014-08-12 ENCOUNTER — Encounter: Payer: Medicare HMO | Attending: Physical Medicine & Rehabilitation

## 2014-08-12 ENCOUNTER — Encounter: Payer: Self-pay | Admitting: Physical Medicine & Rehabilitation

## 2014-08-12 VITALS — BP 124/64 | HR 84 | Resp 14 | Ht 68.0 in | Wt 190.0 lb

## 2014-08-12 DIAGNOSIS — G8110 Spastic hemiplegia affecting unspecified side: Secondary | ICD-10-CM | POA: Insufficient documentation

## 2014-08-12 DIAGNOSIS — I613 Nontraumatic intracerebral hemorrhage in brain stem: Secondary | ICD-10-CM | POA: Insufficient documentation

## 2014-08-12 NOTE — Progress Notes (Signed)
Subjective:    Patient ID: [name], male    DOB: 1930/12/16, 80 y.o.   MRN: [id number] 80 y.o. right-handed male with history diabetes mellitus with peripheral neuropathy, CAD status post CABG, atrial flutter on chronic Coumadin, chronic renal insufficiency with baseline creatinine 0.81, chronic diastolic congestive heart failure. Independent with occasional cane and driving living with family. Admitted 06/11/2014 of left-sided weakness and slurred speech as well as recent fall but denies striking his head. Cranial CT scan shows a right pontomedullary 14 x 22 mm oval focus consistent with acute hemorrhage. INR on admission of 4.48 and systolic blood pressure 185. Placed on intravenous Lopressor and Coumadin was reversed. Followup cranial CT scan unchanged. 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 Tolerating a regular consistency diet. Coumadin remains on hold with cardiac rate control  DATE OF ADMISSION:  06/13/2014 DATE OF DISCHARGE:  07/12/2014  HPI Patient currently at [facility]. Receiving PT and OT and speech therapy. Ambulating with a walker and gait belt and therapy assistance.  No pain complaints. No spasms. No falls. Patient requiring assistance for bathing and dressing   Pain Inventory Average Pain 0 Pain Right Now 0 My pain is intermittent, burning, dull and aching  In the last 24 hours, has pain interfered with the following? General activity 0 Relation with others 0 Enjoyment of life 0 What TIME of day is your pain at its worst? any time Sleep (in general) Fair  Pain is worse with: walking, sitting, inactivity and standing Pain improves with: rest and medication Relief from Meds: 3  Mobility ability to climb steps?  no do you drive?  no use a wheelchair  Function retired  Neuro/Psych weakness confusion  Prior Studies Any changes since last visit?  no  Physicians involved in your care Any changes  since last visit?  no   Family History  Problem Relation Age of Onset  . Stomach cancer Sister   . Pancreatic cancer Brother   . Lung cancer Brother   . Colon cancer Neg Hx    History   Social History  . Marital Status: Widowed    Spouse Name: N/A    Number of Children: 6  . Years of Education: N/A   Occupational History  . Retired    Social History Main Topics  . Smoking status: Former Smoker    Quit date: 09/27/1953  . Smokeless tobacco: Never Used  . Alcohol Use: No  . Drug Use: No  . Sexual Activity: No   Other Topics Concern  . None   Social History Narrative   10th grade. Married '64-10 yrs/widowed; married '77 - 63yrs/widowed. 7 sons, 5 daughters; 30+ grandchildren, 15 great-grands. 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 Lives alone, outdoor cat. His son checks on him all the time.    Past Surgical History  Procedure Laterality Date  . Appendectomy    . Coronary artery bypass graft  2006    SVG to OM2, SVG to LAD, SVG to DX; (the LIMA did not have good flow and therefore was not used)  . Cardiac catheterization  12/21/2011   Past Medical History  Diagnosis Date  . CAD (coronary artery disease)     s/p 3v CABG 2006, myoview 04/2010 EF 49%, prior inferior/apical infarct, no ischemia, LHC 11/2011 stable anatomy (occluded LAD filled from vein graft, distal LCx occluded prior to OM2, OM2 filled from vein graft, RCA prox 25%, mid 40%, distal 25%  lesions, SVG to diagonal occluded) Med Rx  . Chronic diastolic heart failure     hx of cardiorenal syndrome;  Echo 06/2012 EF 62-83%, mod diastolic dysfunction, mild MR, mod TR, mild R/LAE, mild RV dilatation, PASP 7mmHg.  . Carotid stenosis     a. Carotid dopplers R 40-59%, Left 60-79%;   b. carotids 12/13:  1-51% RICA, 76-16% LICA (rpt in 6 mos);  c.  Carotid US (11/14):  R 40-59%; L 60-79%, R vertebral occluded, L vertebral antegrade  . HLD (hyperlipidemia)   . Mediastinal adenopathy     per CT  chest in 2006 with PET scan showing very limited metabolic activity; not felt to have a significant neoplastic potential  . Iron deficiency anemia   . Hypertension   . Diabetes mellitus     insulin dependent   . GERD (gastroesophageal reflux disease)   . AV block, Mobitz 1     Noted 11/2011 in hospital, BB stopped  . Colon polyps   . Diverticulosis   . Pulmonary arterial hypertension     a.  RHC 11/1 PASP 60 mmHg (mean 38); b. RHC 11/15 PA pressure 66/24, PCWP 26 and CO 6.2 => Sildenafil started;   c. Echo bubble study 11/13:  no obvious shunt;   d. VQ scan neg for pulmonary embolism  . CKD (chronic kidney disease) stage 5, GFR less than 15 ml/min     hx of cardiorenal syndrome;  Dr Posey Pronto;  AVF recommended and scheduled (patient hesitant to proceed);   Renal US 11/13: diff echogenic kidneys c/w medical renal disease; no hydronephrosis or renal mass  . Hyperparathyroidism, secondary renal   . MGUS (monoclonal gammopathy of unknown significance)     a. Kappa/Lambda free light chain ratio 11/13:  23.99;  b. Met. Bone survey 11/13:  no osteolytic lesions to suggest MMyeloma  . Atrial flutter     diagnosed 05/2013; Coumadin initiated   BP 124/64 mmHg  Pulse 84  Resp 14  Ht $R'5\' 8"'gR$  (1.727 m)  Wt 190 lb (86.183 kg)  BMI 28.90 kg/m2  SpO2 94%  Opioid Risk Score:   Fall Risk Score: Moderate Fall Risk (6-13 points) Review of Systems  HENT: Negative.   Eyes: Negative.   Respiratory: Negative.   Cardiovascular: Positive for palpitations.  Gastrointestinal: Negative.   Endocrine: Negative.   Genitourinary: Negative.   Musculoskeletal: Positive for myalgias and back pain.  Skin: Negative.   Allergic/Immunologic: Negative.   Neurological: Positive for weakness.  Hematological: Negative.        Objective:   Physical Exam  Constitutional: He is oriented to person, place, and time. He appears well-developed and well-nourished.  HENT:  Head: Normocephalic.  Eyes: Conjunctivae are normal.  Pupils are equal, round, and reactive to light.  Musculoskeletal:  Swelling in the left dorsum of the hand and fingers. No pain with range of motion  2 MAS  Left elbow  Left shoulder with mildly decreased abduction internal and external rotation during range of motion  Motor strength is 2 minus in the finger flexors and finger extensors 2 minus in the elbow flexors and extensors trace at the deltoid  3 minus in the left hip flexor and knee extensor.  2 minus ankle flexion and extension   Neurological: He is alert and oriented to person, place, and time.  Psychiatric: He has a normal mood and affect.  Nursing note and vitals reviewed.         Assessment & Plan:  1. 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 In addition she has edema left dorsum of the hand and fingers. No evidence of RSD however no pain in the left upper extremity.  Command continue PTOT speech at a skilled nursing level Recommend elevation of right hand as well as retrograde massage  If tone starts increasing the left upper extremity may benefit from botulinum toxin injection RTC when necessary

## 2014-08-21 ENCOUNTER — Encounter (HOSPITAL_COMMUNITY): Payer: Medicare HMO

## 2014-08-21 ENCOUNTER — Encounter (HOSPITAL_COMMUNITY)
Admission: RE | Admit: 2014-08-21 | Discharge: 2014-08-21 | Disposition: A | Discharge status: Home / Self Care | Payer: Medicare HMO | Source: Ambulatory Visit | Attending: Nephrology | Admitting: Nephrology

## 2014-08-21 DIAGNOSIS — D631 Anemia in chronic kidney disease: Secondary | ICD-10-CM | POA: Diagnosis not present

## 2014-08-21 DIAGNOSIS — N184 Chronic kidney disease, stage 4 (severe): Secondary | ICD-10-CM | POA: Insufficient documentation

## 2014-08-21 MED ORDER — EPOETIN ALFA 10000 UNIT/ML IJ SOLN
10000.0000 [IU] | INTRAMUSCULAR | Status: DC
  Administered 2014-08-21: 10000 [IU] via SUBCUTANEOUS

## 2014-08-21 MED ORDER — EPOETIN ALFA 10000 UNIT/ML IJ SOLN
INTRAMUSCULAR | Status: AC
  Administered 2014-08-21: 10000 [IU] via SUBCUTANEOUS
  Filled 2014-08-21: qty 1

## 2014-08-24 LAB — POCT HEMOGLOBIN-HEMACUE: HEMOGLOBIN: 10.1 g/dL — AB (ref 13.0–17.0)

## 2014-09-02 ENCOUNTER — Other Ambulatory Visit (HOSPITAL_COMMUNITY): Payer: Self-pay | Admitting: Cardiology

## 2014-09-02 DIAGNOSIS — I6523 Occlusion and stenosis of bilateral carotid arteries: Secondary | ICD-10-CM

## 2014-09-03 ENCOUNTER — Other Ambulatory Visit: Payer: Self-pay | Admitting: Geriatric Medicine

## 2014-09-03 MED ORDER — CARVEDILOL 6.25 MG PO TABS: 6.2500 mg | ORAL_TABLET | Freq: Two times a day (BID) | ORAL | Status: DC

## 2014-09-04 ENCOUNTER — Ambulatory Visit (HOSPITAL_COMMUNITY)
Admission: RE | Admit: 2014-09-04 | Discharge: 2014-09-04 | Disposition: A | Discharge status: Home / Self Care | Payer: Medicare HMO | Source: Ambulatory Visit | Attending: Nephrology | Admitting: Nephrology

## 2014-09-04 DIAGNOSIS — D509 Iron deficiency anemia, unspecified: Secondary | ICD-10-CM | POA: Diagnosis not present

## 2014-09-04 DIAGNOSIS — N185 Chronic kidney disease, stage 5: Secondary | ICD-10-CM | POA: Insufficient documentation

## 2014-09-04 DIAGNOSIS — I2510 Atherosclerotic heart disease of native coronary artery without angina pectoris: Secondary | ICD-10-CM | POA: Insufficient documentation

## 2014-09-04 DIAGNOSIS — Z951 Presence of aortocoronary bypass graft: Secondary | ICD-10-CM | POA: Insufficient documentation

## 2014-09-04 DIAGNOSIS — E1122 Type 2 diabetes mellitus with diabetic chronic kidney disease: Secondary | ICD-10-CM | POA: Insufficient documentation

## 2014-09-04 LAB — RENAL FUNCTION PANEL
ALBUMIN: 3.1 g/dL — AB (ref 3.5–5.2)
Anion gap: 14 (ref 5–15)
BUN: 33 mg/dL — ABNORMAL HIGH (ref 6–23)
CALCIUM: 10 mg/dL (ref 8.4–10.5)
CO2: 19 mEq/L (ref 19–32)
Chloride: 106 mEq/L (ref 96–112)
Creatinine, Ser: 3.69 mg/dL — ABNORMAL HIGH (ref 0.50–1.35)
GFR, EST AFRICAN AMERICAN: 16 mL/min — AB (ref >90)
GFR, EST NON AFRICAN AMERICAN: 14 mL/min — AB (ref >90)
Glucose, Bld: 107 mg/dL — ABNORMAL HIGH (ref 70–99)
PHOSPHORUS: 3 mg/dL (ref 2.3–4.6)
Potassium: 4.6 mEq/L (ref 3.7–5.3)
SODIUM: 139 meq/L (ref 137–147)

## 2014-09-04 LAB — IRON AND TIBC
Iron: 42 ug/dL (ref 42–135)
SATURATION RATIOS: 24 % (ref 20–55)
TIBC: 172 ug/dL — ABNORMAL LOW (ref 215–435)
UIBC: 130 ug/dL (ref 125–400)

## 2014-09-04 LAB — FERRITIN: Ferritin: 1005 ng/mL — ABNORMAL HIGH (ref 22–322)

## 2014-09-04 LAB — POCT HEMOGLOBIN-HEMACUE: Hemoglobin: 8.4 g/dL — ABNORMAL LOW (ref 13.0–17.0)

## 2014-09-04 MED ORDER — EPOETIN ALFA 10000 UNIT/ML IJ SOLN
10000.0000 [IU] | INTRAMUSCULAR | Status: DC
  Administered 2014-09-04: 10000 [IU] via SUBCUTANEOUS

## 2014-09-04 MED ORDER — EPOETIN ALFA 10000 UNIT/ML IJ SOLN
INTRAMUSCULAR | Status: AC
  Filled 2014-09-04: qty 1

## 2014-09-05 ENCOUNTER — Other Ambulatory Visit: Payer: Self-pay

## 2014-09-05 ENCOUNTER — Encounter (HOSPITAL_COMMUNITY): Payer: Self-pay | Admitting: Cardiovascular Disease

## 2014-09-05 MED ORDER — CARVEDILOL 6.25 MG PO TABS: 6.2500 mg | ORAL_TABLET | Freq: Two times a day (BID) | ORAL | Status: DC

## 2014-09-06 ENCOUNTER — Ambulatory Visit (HOSPITAL_COMMUNITY): Payer: Medicare HMO | Attending: Cardiovascular Disease | Admitting: Cardiology

## 2014-09-06 DIAGNOSIS — I10 Essential (primary) hypertension: Secondary | ICD-10-CM | POA: Diagnosis not present

## 2014-09-06 DIAGNOSIS — E119 Type 2 diabetes mellitus without complications: Secondary | ICD-10-CM | POA: Insufficient documentation

## 2014-09-06 DIAGNOSIS — Z87891 Personal history of nicotine dependence: Secondary | ICD-10-CM | POA: Insufficient documentation

## 2014-09-06 DIAGNOSIS — Z951 Presence of aortocoronary bypass graft: Secondary | ICD-10-CM | POA: Diagnosis not present

## 2014-09-06 DIAGNOSIS — I739 Peripheral vascular disease, unspecified: Secondary | ICD-10-CM | POA: Insufficient documentation

## 2014-09-06 DIAGNOSIS — E785 Hyperlipidemia, unspecified: Secondary | ICD-10-CM | POA: Diagnosis not present

## 2014-09-06 DIAGNOSIS — I6523 Occlusion and stenosis of bilateral carotid arteries: Secondary | ICD-10-CM

## 2014-09-06 DIAGNOSIS — I2510 Atherosclerotic heart disease of native coronary artery without angina pectoris: Secondary | ICD-10-CM | POA: Diagnosis not present

## 2014-09-06 NOTE — Progress Notes (Signed)
Carotid duplex performed 

## 2014-09-18 ENCOUNTER — Encounter (HOSPITAL_COMMUNITY): Payer: Medicare HMO

## 2014-09-18 ENCOUNTER — Inpatient Hospital Stay (HOSPITAL_COMMUNITY): Admission: RE | Admit: 2014-09-18 | Payer: Medicare HMO | Source: Ambulatory Visit

## 2014-09-25 ENCOUNTER — Encounter (HOSPITAL_COMMUNITY): Payer: Medicare HMO

## 2014-09-30 ENCOUNTER — Encounter: Payer: Self-pay | Admitting: Family

## 2014-09-30 ENCOUNTER — Ambulatory Visit (INDEPENDENT_AMBULATORY_CARE_PROVIDER_SITE_OTHER): Payer: Medicare HMO | Admitting: Family

## 2014-09-30 ENCOUNTER — Other Ambulatory Visit (INDEPENDENT_AMBULATORY_CARE_PROVIDER_SITE_OTHER): Payer: Medicare HMO

## 2014-09-30 VITALS — BP 150/84 | HR 64 | Temp 97.8°F | Resp 18

## 2014-09-30 DIAGNOSIS — E118 Type 2 diabetes mellitus with unspecified complications: Secondary | ICD-10-CM

## 2014-09-30 DIAGNOSIS — I4892 Unspecified atrial flutter: Secondary | ICD-10-CM

## 2014-09-30 DIAGNOSIS — L89610 Pressure ulcer of right heel, unstageable: Secondary | ICD-10-CM | POA: Diagnosis not present

## 2014-09-30 DIAGNOSIS — Z87891 Personal history of nicotine dependence: Secondary | ICD-10-CM

## 2014-09-30 DIAGNOSIS — N185 Chronic kidney disease, stage 5: Secondary | ICD-10-CM

## 2014-09-30 DIAGNOSIS — I132 Hypertensive heart and chronic kidney disease with heart failure and with stage 5 chronic kidney disease, or end stage renal disease: Secondary | ICD-10-CM

## 2014-09-30 DIAGNOSIS — I6900 Unspecified sequelae of nontraumatic subarachnoid hemorrhage: Secondary | ICD-10-CM | POA: Diagnosis not present

## 2014-09-30 DIAGNOSIS — E785 Hyperlipidemia, unspecified: Secondary | ICD-10-CM

## 2014-09-30 DIAGNOSIS — Z9181 History of falling: Secondary | ICD-10-CM

## 2014-09-30 DIAGNOSIS — I10 Essential (primary) hypertension: Secondary | ICD-10-CM

## 2014-09-30 DIAGNOSIS — L89623 Pressure ulcer of left heel, stage 3: Secondary | ICD-10-CM | POA: Diagnosis not present

## 2014-09-30 DIAGNOSIS — I5032 Chronic diastolic (congestive) heart failure: Secondary | ICD-10-CM

## 2014-09-30 DIAGNOSIS — L89613 Pressure ulcer of right heel, stage 3: Secondary | ICD-10-CM | POA: Diagnosis not present

## 2014-09-30 DIAGNOSIS — E1140 Type 2 diabetes mellitus with diabetic neuropathy, unspecified: Secondary | ICD-10-CM

## 2014-09-30 DIAGNOSIS — K219 Gastro-esophageal reflux disease without esophagitis: Secondary | ICD-10-CM

## 2014-09-30 DIAGNOSIS — I2510 Atherosclerotic heart disease of native coronary artery without angina pectoris: Secondary | ICD-10-CM

## 2014-09-30 LAB — CBC
HEMATOCRIT: 25 % — AB (ref 39.0–52.0)
Hemoglobin: 7.8 g/dL — CL (ref 13.0–17.0)
MCHC: 31.3 g/dL (ref 30.0–36.0)
MCV: 90 fl (ref 78.0–100.0)
Platelets: 132 10*3/uL — ABNORMAL LOW (ref 150.0–400.0)
RBC: 2.78 Mil/uL — ABNORMAL LOW (ref 4.22–5.81)
RDW: 19.9 % — ABNORMAL HIGH (ref 11.5–15.5)
WBC: 4.3 10*3/uL (ref 4.0–10.5)

## 2014-09-30 LAB — BASIC METABOLIC PANEL
BUN: 31 mg/dL — ABNORMAL HIGH (ref 6–23)
CO2: 20 meq/L (ref 19–32)
Calcium: 9.5 mg/dL (ref 8.4–10.5)
Chloride: 110 mEq/L (ref 96–112)
Creatinine, Ser: 4 mg/dL — ABNORMAL HIGH (ref 0.4–1.5)
GFR: 18.8 mL/min — AB (ref >60.00)
Glucose, Bld: 87 mg/dL (ref 70–99)
Potassium: 4.4 mEq/L (ref 3.5–5.1)
SODIUM: 139 meq/L (ref 135–145)

## 2014-09-30 LAB — HEMOGLOBIN A1C: Hgb A1c MFr Bld: 6.4 % (ref 4.6–6.5)

## 2014-09-30 MED ORDER — PANTOPRAZOLE SODIUM 40 MG PO TBEC: 40.0000 mg | DELAYED_RELEASE_TABLET | Freq: Every day | ORAL | Status: DC

## 2014-09-30 MED ORDER — SULFAMETHOXAZOLE-TRIMETHOPRIM 800-160 MG PO TABS: 1.0000 | ORAL_TABLET | Freq: Two times a day (BID) | ORAL | Status: DC

## 2014-09-30 MED ORDER — LINAGLIPTIN 5 MG PO TABS: 5.0000 mg | ORAL_TABLET | Freq: Every day | ORAL | Status: DC

## 2014-09-30 NOTE — Assessment & Plan Note (Signed)
Patient came in on two separate PPIs. Discontinue omeprazole and continue pantoprazole 40 mg daily.

## 2014-09-30 NOTE — Progress Notes (Signed)
Pre visit review using our clinic review tool, if applicable. No additional management support is needed unless otherwise documented below in the visit note. 

## 2014-09-30 NOTE — Assessment & Plan Note (Signed)
Stable with current regimen. Continue atorvastatin at current dosage.

## 2014-09-30 NOTE — Assessment & Plan Note (Signed)
Slightly above goal. Assisted management with nephrology. Continue current regimen of amlodipine and carvedilol at current dosage. If blood pressure remains elevated, will consider addition of another agent.

## 2014-09-30 NOTE — Progress Notes (Signed)
Subjective:    Patient ID: [name], male    DOB: 02-10-1931, 81 y.o.   MRN: [id number]  Chief Complaint  Patient presents with  . Establish Care    diabetes check, check feet    HPI:  [name] is a 81 y.o. male who presents today for follow up after recent discharge from [facility].  1) Diabetes - Currently treated with linagliptin.   Lab Results  Component Value Date   HGBA1C 6.7* 06/11/2014   Lab Results  Component Value Date   CREATININE 3.69* 09/04/2014    2) Hypertension -  Maintained on amlodipine and carvedilol.   BP Readings from Last 3 Encounters:  09/30/14 150/84  09/04/14 153/54  08/21/14 140/61    3) Hyperlipidemia - currently maintained on atorvastatin.  Lab Results  Component Value Date   CHOL 109 06/13/2014   HDL 38* 06/13/2014   LDLCALC 59 06/13/2014   TRIG 61 06/13/2014   CHOLHDL 2.9 06/13/2014    4) GERD - Currently maintained on omeprazole and protonix.    No Known Allergies   Current Outpatient Prescriptions on File Prior to Visit  Medication Sig Dispense Refill  . amLODipine (NORVASC) 10 MG tablet Take 10 mg by mouth daily.    Marland Kitchen atorvastatin (LIPITOR) 20 MG tablet take 1 tablet by mouth at bedtime 30 tablet 3  . carvedilol (COREG) 6.25 MG tablet Take 1 tablet (6.25 mg total) by mouth 2 (two) times daily. 180 tablet 1  . polyethylene glycol powder (GLYCOLAX/MIRALAX) powder Take 1 Container by mouth once.    . potassium chloride SA (K-DUR,KLOR-CON) 20 MEQ tablet Take 20 mEq by mouth 2 (two) times daily.    . vitamin C (ASCORBIC ACID) 500 MG tablet Take 500 mg by mouth 2 (two) times daily.    . Vitamin D, Ergocalciferol, (DRISDOL) 50000 UNITS CAPS capsule Take 50,000 Units by mouth every Friday.      No current facility-administered medications on file prior to visit.    Past Medical History  Diagnosis Date  . CAD (coronary artery disease)     s/p 3v CABG 2006, myoview 04/2010 EF 49%, prior  inferior/apical infarct, no ischemia, LHC 11/2011 stable anatomy (occluded LAD filled from vein graft, distal LCx occluded prior to OM2, OM2 filled from vein graft, RCA prox 25%, mid 40%, distal 25% lesions, SVG to diagonal occluded) Med Rx  . Chronic diastolic heart failure     hx of cardiorenal syndrome;  Echo 06/2012 EF 01-65%, mod diastolic dysfunction, mild MR, mod TR, mild R/LAE, mild RV dilatation, PASP 39mmHg.  . Carotid stenosis     a. Carotid dopplers R 40-59%, Left 60-79%;   b. carotids 12/13:  5-37% RICA, 48-27% LICA (rpt in 6 mos);  c.  Carotid US (11/14):  R 40-59%; L 60-79%, R vertebral occluded, L vertebral antegrade  . HLD (hyperlipidemia)   . Mediastinal adenopathy     per CT chest in 2006 with PET scan showing very limited metabolic activity; not felt to have a significant neoplastic potential  . Iron deficiency anemia   . Hypertension   . Diabetes mellitus     insulin dependent   . GERD (gastroesophageal reflux disease)   . AV block, Mobitz 1     Noted 11/2011 in hospital, BB stopped  . Colon polyps   . Diverticulosis   . Pulmonary arterial hypertension     a.  RHC 11/1 PASP 60 mmHg (mean 38); b. RHC 11/15  PA pressure 66/24, PCWP 26 and CO 6.2 => Sildenafil started;   c. Echo bubble study 11/13:  no obvious shunt;   d. VQ scan neg for pulmonary embolism  . CKD (chronic kidney disease) stage 5, GFR less than 15 ml/min     hx of cardiorenal syndrome;  Dr Posey Pronto;  AVF recommended and scheduled (patient hesitant to proceed);   Renal US 11/13: diff echogenic kidneys c/w medical renal disease; no hydronephrosis or renal mass  . Hyperparathyroidism, secondary renal   . MGUS (monoclonal gammopathy of unknown significance)     a. Kappa/Lambda free light chain ratio 11/13:  23.99;  b. Met. Bone survey 11/13:  no osteolytic lesions to suggest MMyeloma  . Atrial flutter     diagnosed 05/2013; Coumadin initiated    Review of Systems  Constitutional: Negative for fever and chills.    Respiratory: Negative for cough, chest tightness and shortness of breath.   Cardiovascular: Positive for leg swelling. Negative for chest pain and palpitations.  Endocrine: Negative for polydipsia, polyphagia and polyuria.  Musculoskeletal: Negative for myalgias.  Skin: Positive for wound (Bilateral heels).        Objective:    BP 150/84 mmHg  Pulse 64  Temp(Src) 97.8 F (36.6 C) (Oral)  Resp 18  SpO2 96% Nursing note and vital signs reviewed.  Physical Exam  Constitutional: He is oriented to person, place, and time. He appears well-developed and well-nourished. No distress.  Elderly gentlemen seated in a wheelchair with both lower extremities in heel protector boots. Appears his stated age and is dressed appropriately for the situation.   Cardiovascular: Normal rate, regular rhythm, normal heart sounds and intact distal pulses.   Pulmonary/Chest: Effort normal and breath sounds normal.  Neurological: He is alert and oriented to person, place, and time.  Skin: Skin is warm and dry.     Psychiatric: He has a normal mood and affect. His behavior is normal. Judgment and thought content normal.       Assessment & Plan:

## 2014-09-30 NOTE — Patient Instructions (Addendum)
Thank you for choosing [facility].  Summary/Instructions:  Your prescription(s) have been submitted to your pharmacy or been printed and provided for you. Please take as directed and contact our office if you believe you are having problem(s) with the medication(s) or have any questions.  Please stop by the lab on the basement level of the building for your blood work. Your results will be released to MyChart (or called to you) after review, usually within 72 hours after test completion. If any changes need to be made, you will be notified at that same time.  Referrals have been made during this visit. You should expect to hear back from our schedulers in about 7-10 days in regards to establishing an appointment with the specialists we discussed.   If your symptoms worsen or fail to improve, please contact our office for further instruction, or in case of emergency go directly to the emergency room at the closest medical facility.     

## 2014-09-30 NOTE — Assessment & Plan Note (Addendum)
Blood sugar remains stable at present with A1c at 6.8. Decreased sensation noted and questionable diabetic ulcer versus a pressure ulcers on heels bilaterally. Wounds were cleansed and redressed. Will refer to wound care specialists for further management in healing. Obtain BMET and A1c. Start Bactrim to cover for potential infection of right wound. Follow up in 1 week.

## 2014-10-01 ENCOUNTER — Telehealth: Payer: Self-pay | Admitting: Family

## 2014-10-01 NOTE — Telephone Encounter (Signed)
Tried calling pt and left message for him to call back.

## 2014-10-01 NOTE — Telephone Encounter (Signed)
Please call patient and inform him that his hemoglobin A1c is good at 6.4. The 2 concerns with the lab work that need to be addressed are his hemoglobin is 7.8. He needs to contact his nephrologist regarding his EPO injections as it appears he hasn't had one since November and this will help with his hemoglobin. Also I would recommend he start taking Ferrous sulfate (available over the counter) to help with his hemoglobin as well.

## 2014-10-02 ENCOUNTER — Ambulatory Visit (HOSPITAL_COMMUNITY)
Admission: RE | Admit: 2014-10-02 | Discharge: 2014-10-02 | Disposition: A | Discharge status: Home / Self Care | Payer: Medicare HMO | Source: Ambulatory Visit | Attending: Nephrology | Admitting: Nephrology

## 2014-10-02 DIAGNOSIS — N184 Chronic kidney disease, stage 4 (severe): Secondary | ICD-10-CM | POA: Diagnosis not present

## 2014-10-02 DIAGNOSIS — D631 Anemia in chronic kidney disease: Secondary | ICD-10-CM | POA: Insufficient documentation

## 2014-10-02 LAB — RENAL FUNCTION PANEL
Albumin: 3.5 g/dL (ref 3.5–5.2)
Anion gap: 8 (ref 5–15)
BUN: 30 mg/dL — ABNORMAL HIGH (ref 6–23)
CO2: 21 mmol/L (ref 19–32)
CREATININE: 4.41 mg/dL — AB (ref 0.50–1.35)
Calcium: 9.9 mg/dL (ref 8.4–10.5)
Chloride: 111 mEq/L (ref 96–112)
GFR calc Af Amer: 13 mL/min — ABNORMAL LOW (ref >90)
GFR, EST NON AFRICAN AMERICAN: 11 mL/min — AB (ref >90)
Glucose, Bld: 86 mg/dL (ref 70–99)
Phosphorus: 3.4 mg/dL (ref 2.3–4.6)
Potassium: 4.4 mmol/L (ref 3.5–5.1)
Sodium: 140 mmol/L (ref 135–145)

## 2014-10-02 LAB — IRON AND TIBC
Iron: 56 ug/dL (ref 42–165)
Saturation Ratios: 33 % (ref 20–55)
TIBC: 172 ug/dL — AB (ref 215–435)
UIBC: 116 ug/dL — ABNORMAL LOW (ref 125–400)

## 2014-10-02 LAB — ABO/RH: ABO/RH(D): O POS

## 2014-10-02 LAB — FERRITIN: Ferritin: 903 ng/mL — ABNORMAL HIGH (ref 22–322)

## 2014-10-02 LAB — PREPARE RBC (CROSSMATCH)

## 2014-10-02 MED ORDER — EPOETIN ALFA 10000 UNIT/ML IJ SOLN
INTRAMUSCULAR | Status: AC
  Administered 2014-10-02: 10000 [IU] via SUBCUTANEOUS
  Filled 2014-10-02: qty 1

## 2014-10-02 MED ORDER — EPOETIN ALFA 10000 UNIT/ML IJ SOLN
10000.0000 [IU] | INTRAMUSCULAR | Status: DC
  Administered 2014-10-02: 10000 [IU] via SUBCUTANEOUS

## 2014-10-02 NOTE — Progress Notes (Signed)
Hemocue 7.3 - Patient denies shortness of breath or feeling tired but son who accompanies him today says he becomes short of breath with activity and he states patient is tired all the time.  Both state they have seen no sign of bleeding. Crystal at [facility] advised.  She will let Dr Posey Pronto know and call back with further instructions

## 2014-10-03 ENCOUNTER — Encounter (HOSPITAL_COMMUNITY)
Admission: RE | Admit: 2014-10-03 | Discharge: 2014-10-03 | Disposition: A | Discharge status: Home / Self Care | Payer: Medicare HMO | Source: Ambulatory Visit | Attending: Nephrology | Admitting: Nephrology

## 2014-10-03 DIAGNOSIS — N184 Chronic kidney disease, stage 4 (severe): Secondary | ICD-10-CM | POA: Insufficient documentation

## 2014-10-03 DIAGNOSIS — D631 Anemia in chronic kidney disease: Secondary | ICD-10-CM | POA: Diagnosis not present

## 2014-10-03 LAB — POCT HEMOGLOBIN-HEMACUE: HEMOGLOBIN: 7.3 g/dL — AB (ref 13.0–17.0)

## 2014-10-03 MED ORDER — SODIUM CHLORIDE 0.9 % IV SOLN: Freq: Once | INTRAVENOUS | Status: DC

## 2014-10-03 NOTE — Telephone Encounter (Signed)
Called pt and son is aware of the lab work. [name] his dad went today to get 2 pints of blood due to his hemoglobin being low. He is aware that his dad can start taking an iron supplement. He was also transferred over to scheduling to make an appointment to talk about his dads bed and a walker from [place].

## 2014-10-04 LAB — TYPE AND SCREEN
ABO/RH(D): O POS
Antibody Screen: NEGATIVE
Unit division: 0
Unit division: 0

## 2014-10-05 ENCOUNTER — Inpatient Hospital Stay (HOSPITAL_COMMUNITY)
Admission: EM | Admit: 2014-10-05 | Discharge: 2014-10-07 | DRG: 292 | Disposition: A | Discharge status: Home / Self Care | Payer: Medicare HMO | Attending: Internal Medicine | Admitting: Internal Medicine

## 2014-10-05 ENCOUNTER — Emergency Department (HOSPITAL_COMMUNITY): Payer: Medicare HMO

## 2014-10-05 ENCOUNTER — Encounter (HOSPITAL_COMMUNITY): Payer: Self-pay

## 2014-10-05 DIAGNOSIS — Z794 Long term (current) use of insulin: Secondary | ICD-10-CM | POA: Diagnosis not present

## 2014-10-05 DIAGNOSIS — E11621 Type 2 diabetes mellitus with foot ulcer: Secondary | ICD-10-CM | POA: Diagnosis present

## 2014-10-05 DIAGNOSIS — Z8673 Personal history of transient ischemic attack (TIA), and cerebral infarction without residual deficits: Secondary | ICD-10-CM

## 2014-10-05 DIAGNOSIS — I2510 Atherosclerotic heart disease of native coronary artery without angina pectoris: Secondary | ICD-10-CM | POA: Diagnosis present

## 2014-10-05 DIAGNOSIS — L89612 Pressure ulcer of right heel, stage 2: Secondary | ICD-10-CM | POA: Diagnosis present

## 2014-10-05 DIAGNOSIS — R0602 Shortness of breath: Secondary | ICD-10-CM | POA: Diagnosis not present

## 2014-10-05 DIAGNOSIS — I6529 Occlusion and stenosis of unspecified carotid artery: Secondary | ICD-10-CM | POA: Diagnosis present

## 2014-10-05 DIAGNOSIS — K219 Gastro-esophageal reflux disease without esophagitis: Secondary | ICD-10-CM | POA: Diagnosis present

## 2014-10-05 DIAGNOSIS — Z86010 Personal history of colonic polyps: Secondary | ICD-10-CM

## 2014-10-05 DIAGNOSIS — I5033 Acute on chronic diastolic (congestive) heart failure: Secondary | ICD-10-CM | POA: Diagnosis not present

## 2014-10-05 DIAGNOSIS — D509 Iron deficiency anemia, unspecified: Secondary | ICD-10-CM | POA: Diagnosis present

## 2014-10-05 DIAGNOSIS — I272 Other secondary pulmonary hypertension: Secondary | ICD-10-CM | POA: Diagnosis present

## 2014-10-05 DIAGNOSIS — J811 Chronic pulmonary edema: Secondary | ICD-10-CM | POA: Diagnosis present

## 2014-10-05 DIAGNOSIS — Z7901 Long term (current) use of anticoagulants: Secondary | ICD-10-CM | POA: Diagnosis not present

## 2014-10-05 DIAGNOSIS — I132 Hypertensive heart and chronic kidney disease with heart failure and with stage 5 chronic kidney disease, or end stage renal disease: Secondary | ICD-10-CM | POA: Diagnosis present

## 2014-10-05 DIAGNOSIS — E785 Hyperlipidemia, unspecified: Secondary | ICD-10-CM | POA: Diagnosis present

## 2014-10-05 DIAGNOSIS — Z951 Presence of aortocoronary bypass graft: Secondary | ICD-10-CM | POA: Diagnosis not present

## 2014-10-05 DIAGNOSIS — Z7982 Long term (current) use of aspirin: Secondary | ICD-10-CM | POA: Diagnosis not present

## 2014-10-05 DIAGNOSIS — E118 Type 2 diabetes mellitus with unspecified complications: Secondary | ICD-10-CM | POA: Diagnosis present

## 2014-10-05 DIAGNOSIS — N2581 Secondary hyperparathyroidism of renal origin: Secondary | ICD-10-CM | POA: Diagnosis present

## 2014-10-05 DIAGNOSIS — L89620 Pressure ulcer of left heel, unstageable: Secondary | ICD-10-CM | POA: Diagnosis present

## 2014-10-05 DIAGNOSIS — E8771 Transfusion associated circulatory overload: Secondary | ICD-10-CM

## 2014-10-05 DIAGNOSIS — G8194 Hemiplegia, unspecified affecting left nondominant side: Secondary | ICD-10-CM | POA: Diagnosis present

## 2014-10-05 DIAGNOSIS — N179 Acute kidney failure, unspecified: Secondary | ICD-10-CM

## 2014-10-05 DIAGNOSIS — I509 Heart failure, unspecified: Secondary | ICD-10-CM

## 2014-10-05 DIAGNOSIS — I4891 Unspecified atrial fibrillation: Secondary | ICD-10-CM | POA: Diagnosis present

## 2014-10-05 DIAGNOSIS — Z7401 Bed confinement status: Secondary | ICD-10-CM

## 2014-10-05 DIAGNOSIS — N185 Chronic kidney disease, stage 5: Secondary | ICD-10-CM | POA: Diagnosis present

## 2014-10-05 DIAGNOSIS — N189 Chronic kidney disease, unspecified: Secondary | ICD-10-CM

## 2014-10-05 DIAGNOSIS — Z87891 Personal history of nicotine dependence: Secondary | ICD-10-CM | POA: Diagnosis not present

## 2014-10-05 LAB — COMPREHENSIVE METABOLIC PANEL
ALK PHOS: 76 U/L (ref 39–117)
ALT: <5 U/L (ref 0–53)
ANION GAP: 9 (ref 5–15)
AST: 20 U/L (ref 0–37)
Albumin: 3.6 g/dL (ref 3.5–5.2)
BUN: 30 mg/dL — AB (ref 6–23)
CALCIUM: 9.8 mg/dL (ref 8.4–10.5)
CO2: 18 mmol/L — ABNORMAL LOW (ref 19–32)
CREATININE: 5.11 mg/dL — AB (ref 0.50–1.35)
Chloride: 111 mEq/L (ref 96–112)
GFR calc non Af Amer: 10 mL/min — ABNORMAL LOW (ref >90)
GFR, EST AFRICAN AMERICAN: 11 mL/min — AB (ref >90)
Glucose, Bld: 87 mg/dL (ref 70–99)
POTASSIUM: 4.8 mmol/L (ref 3.5–5.1)
Sodium: 138 mmol/L (ref 135–145)
Total Bilirubin: 0.7 mg/dL (ref 0.3–1.2)
Total Protein: 7.2 g/dL (ref 6.0–8.3)

## 2014-10-05 LAB — MAGNESIUM: Magnesium: 2 mg/dL (ref 1.5–2.5)

## 2014-10-05 LAB — CBC
HCT: 32.2 % — ABNORMAL LOW (ref 39.0–52.0)
Hemoglobin: 10.4 g/dL — ABNORMAL LOW (ref 13.0–17.0)
MCH: 27.4 pg (ref 26.0–34.0)
MCHC: 32.3 g/dL (ref 30.0–36.0)
MCV: 84.7 fL (ref 78.0–100.0)
Platelets: 123 10*3/uL — ABNORMAL LOW (ref 150–400)
RBC: 3.8 MIL/uL — AB (ref 4.22–5.81)
RDW: 17.3 % — AB (ref 11.5–15.5)
WBC: 4 10*3/uL (ref 4.0–10.5)

## 2014-10-05 LAB — PROTIME-INR
INR: 1.1 (ref 0.00–1.49)
Prothrombin Time: 14.3 seconds (ref 11.6–15.2)

## 2014-10-05 LAB — I-STAT TROPONIN, ED: Troponin i, poc: 0.02 ng/mL (ref 0.00–0.08)

## 2014-10-05 LAB — GLUCOSE, CAPILLARY
Glucose-Capillary: 151 mg/dL — ABNORMAL HIGH (ref 70–99)
Glucose-Capillary: 84 mg/dL (ref 70–99)

## 2014-10-05 LAB — BRAIN NATRIURETIC PEPTIDE: B Natriuretic Peptide: 606.7 pg/mL — ABNORMAL HIGH (ref 0.0–100.0)

## 2014-10-05 MED ORDER — FUROSEMIDE 10 MG/ML IJ SOLN
40.0000 mg | Freq: Once | INTRAMUSCULAR | Status: AC
  Administered 2014-10-05: 40 mg via INTRAVENOUS
  Filled 2014-10-05: qty 4

## 2014-10-05 MED ORDER — SULFAMETHOXAZOLE-TRIMETHOPRIM 800-160 MG PO TABS
1.0000 | ORAL_TABLET | Freq: Two times a day (BID) | ORAL | Status: DC
  Filled 2014-10-05: qty 1

## 2014-10-05 MED ORDER — ONDANSETRON HCL 4 MG/2ML IJ SOLN
4.0000 mg | Freq: Four times a day (QID) | INTRAMUSCULAR | Status: DC | PRN
  Administered 2014-10-06: 4 mg via INTRAVENOUS
  Filled 2014-10-05: qty 2

## 2014-10-05 MED ORDER — SODIUM CHLORIDE 0.9 % IJ SOLN
3.0000 mL | Freq: Two times a day (BID) | INTRAMUSCULAR | Status: DC
  Administered 2014-10-05 – 2014-10-07 (×4): 3 mL via INTRAVENOUS

## 2014-10-05 MED ORDER — SODIUM CHLORIDE 0.9 % IJ SOLN
3.0000 mL | INTRAMUSCULAR | Status: DC | PRN
Start: 2014-10-05 — End: 2014-10-07

## 2014-10-05 MED ORDER — PANTOPRAZOLE SODIUM 40 MG PO TBEC
40.0000 mg | DELAYED_RELEASE_TABLET | Freq: Every day | ORAL | Status: DC
  Administered 2014-10-05 – 2014-10-07 (×3): 40 mg via ORAL
  Filled 2014-10-05 (×3): qty 1

## 2014-10-05 MED ORDER — FUROSEMIDE 10 MG/ML IJ SOLN: 40.0000 mg | Freq: Two times a day (BID) | INTRAMUSCULAR | Status: DC

## 2014-10-05 MED ORDER — LINAGLIPTIN 5 MG PO TABS
5.0000 mg | ORAL_TABLET | Freq: Every day | ORAL | Status: DC
  Administered 2014-10-07: 5 mg via ORAL
  Filled 2014-10-05 (×2): qty 1

## 2014-10-05 MED ORDER — SULFAMETHOXAZOLE-TRIMETHOPRIM 800-160 MG PO TABS
1.0000 | ORAL_TABLET | Freq: Every day | ORAL | Status: DC
  Administered 2014-10-05 – 2014-10-07 (×3): 1 via ORAL
  Filled 2014-10-05 (×3): qty 1

## 2014-10-05 MED ORDER — FUROSEMIDE 10 MG/ML IJ SOLN
10.0000 mg/h | INTRAVENOUS | Status: AC
  Administered 2014-10-05: 10 mg/h via INTRAVENOUS
  Filled 2014-10-05: qty 25

## 2014-10-05 MED ORDER — AMLODIPINE BESYLATE 10 MG PO TABS
10.0000 mg | ORAL_TABLET | Freq: Every day | ORAL | Status: DC
  Administered 2014-10-05 – 2014-10-07 (×3): 10 mg via ORAL
  Filled 2014-10-05 (×3): qty 1

## 2014-10-05 MED ORDER — SODIUM CHLORIDE 0.9 % IV SOLN
250.0000 mL | INTRAVENOUS | Status: DC | PRN
  Administered 2014-10-05: 250 mL via INTRAVENOUS

## 2014-10-05 MED ORDER — HEPARIN SODIUM (PORCINE) 5000 UNIT/ML IJ SOLN
5000.0000 [IU] | Freq: Three times a day (TID) | INTRAMUSCULAR | Status: DC
  Administered 2014-10-05 – 2014-10-07 (×6): 5000 [IU] via SUBCUTANEOUS
  Filled 2014-10-05 (×7): qty 1

## 2014-10-05 MED ORDER — ATORVASTATIN CALCIUM 20 MG PO TABS
20.0000 mg | ORAL_TABLET | Freq: Every day | ORAL | Status: DC
  Administered 2014-10-05 – 2014-10-06 (×2): 20 mg via ORAL
  Filled 2014-10-05 (×3): qty 1

## 2014-10-05 MED ORDER — ZINC SULFATE 220 (50 ZN) MG PO CAPS
220.0000 mg | ORAL_CAPSULE | Freq: Every day | ORAL | Status: DC
  Administered 2014-10-06 – 2014-10-07 (×2): 220 mg via ORAL
  Filled 2014-10-05 (×2): qty 1

## 2014-10-05 MED ORDER — VITAMIN D (ERGOCALCIFEROL) 1.25 MG (50000 UNIT) PO CAPS: 50000.0000 [IU] | ORAL_CAPSULE | ORAL | Status: DC

## 2014-10-05 MED ORDER — ASPIRIN 81 MG PO CHEW
81.0000 mg | CHEWABLE_TABLET | Freq: Every day | ORAL | Status: DC
  Administered 2014-10-05 – 2014-10-07 (×3): 81 mg via ORAL
  Filled 2014-10-05 (×3): qty 1

## 2014-10-05 MED ORDER — ACETAMINOPHEN 325 MG PO TABS: 650.0000 mg | ORAL_TABLET | ORAL | Status: DC | PRN

## 2014-10-05 MED ORDER — CARVEDILOL 6.25 MG PO TABS
6.2500 mg | ORAL_TABLET | Freq: Two times a day (BID) | ORAL | Status: DC
  Administered 2014-10-05 – 2014-10-07 (×4): 6.25 mg via ORAL
  Filled 2014-10-05 (×5): qty 1

## 2014-10-05 MED ORDER — FERROUS SULFATE 325 (65 FE) MG PO TABS
325.0000 mg | ORAL_TABLET | Freq: Every day | ORAL | Status: DC
  Administered 2014-10-06 – 2014-10-07 (×2): 325 mg via ORAL
  Filled 2014-10-05 (×3): qty 1

## 2014-10-05 MED ORDER — VITAMIN C 500 MG PO TABS
500.0000 mg | ORAL_TABLET | Freq: Two times a day (BID) | ORAL | Status: DC
  Administered 2014-10-05 – 2014-10-07 (×4): 500 mg via ORAL
  Filled 2014-10-05 (×5): qty 1

## 2014-10-05 NOTE — ED Notes (Signed)
There was 331 ml of urine in bladder that was checked with the bladder scan.

## 2014-10-05 NOTE — Progress Notes (Signed)
Patient arrived on unit from ED, BP 176/84, vital signs otherwise stable.  Patient and family oriented to unit.  Patient placed on telemetry and given snack.  Clarified with Dr. Wynelle Cleveland that both Lasix infusion and 40mg  IV lasix dose to be administered.  Condom catheter placed on patient at son's request.  Patient and family deny any questions or concerns at this time.  Will continue to monitor.

## 2014-10-05 NOTE — ED Notes (Signed)
Pt. Received blood on 10/03/2014 and since then his son has reported pt. Has been sob and congested.  Pt. Denies any chest pain but is sob at rest. Lt. Side is weak and unable to move his leg and arm due to a previous stroke.  Pt. Does not ambulate.  Pt. Is alert and oriented X 4. Intermittent confusion at times.

## 2014-10-05 NOTE — ED Provider Notes (Signed)
CSN: [id number]     Arrival date & time 10/05/14  1108 History   First MD Initiated Contact with Patient 10/05/14 1114     Chief Complaint  Patient presents with  . Shortness of Breath   HPI Patient presents to the emergency room for evaluation of shortness of breath. History is obtained through the son as well as the patient. The patient has a history of chronic anemia. He saw his kidney doctor recently and they ordered a blood transfusion. Patient had a blood transfusion on January 7. Since that time the son has felt that the patient's breathing has become more labored. He has also become weaker. The patient has a history of multiple medical problems and at baseline is only able to stand with assistance because of a prior stroke. However evening doing that minimal activity makes him very short of breath and fatigue. The symptoms have gradually been getting worse so his son brought him into the emergency room today. He has not been coughing. He has not had any fevers. He denies any chest pain. Son does feel that his hands and feet are more swollen than usual. Past Medical History  Diagnosis Date  . CAD (coronary artery disease)     s/p 3v CABG 2006, myoview 04/2010 EF 49%, prior inferior/apical infarct, no ischemia, LHC 11/2011 stable anatomy (occluded LAD filled from vein graft, distal LCx occluded prior to OM2, OM2 filled from vein graft, RCA prox 25%, mid 40%, distal 25% lesions, SVG to diagonal occluded) Med Rx  . Chronic diastolic heart failure     hx of cardiorenal syndrome;  Echo 06/2012 EF 46-56%, mod diastolic dysfunction, mild MR, mod TR, mild R/LAE, mild RV dilatation, PASP 31mHg.  . Carotid stenosis     a. Carotid dopplers R 40-59%, Left 60-79%;   b. carotids 12/13:  08-12%RICA, 675-17%LICA (rpt in 6 mos);  c.  Carotid [place](11/14):  R 40-59%; L 60-79%, R vertebral occluded, L vertebral antegrade  . HLD (hyperlipidemia)   . Mediastinal adenopathy     per CT chest in 2006 with PET scan  showing very limited metabolic activity; not felt to have a significant neoplastic potential  . Iron deficiency anemia   . Hypertension   . Diabetes mellitus     insulin dependent   . GERD (gastroesophageal reflux disease)   . AV block, Mobitz 1     Noted 11/2011 in hospital, BB stopped  . Colon polyps   . Diverticulosis   . Pulmonary arterial hypertension     a.  RHC 11/1 PASP 60 mmHg (mean 38); b. RHC 11/15 PA pressure 66/24, PCWP 26 and CO 6.2 => Sildenafil started;   c. Echo bubble study 11/13:  no obvious shunt;   d. VQ scan neg for pulmonary embolism  . CKD (chronic kidney disease) stage 5, GFR less than 15 ml/min     hx of cardiorenal syndrome;  Dr PPosey Pronto  AVF recommended and scheduled (patient hesitant to proceed);   Renal [place]11/13: diff echogenic kidneys c/w medical renal disease; no hydronephrosis or renal mass  . Hyperparathyroidism, secondary renal   . MGUS (monoclonal gammopathy of unknown significance)     a. Kappa/Lambda free light chain ratio 11/13:  23.99;  b. Met. Bone survey 11/13:  no osteolytic lesions to suggest MMyeloma  . Atrial flutter     diagnosed 05/2013; Coumadin initiated   Past Surgical History  Procedure Laterality Date  . Appendectomy    . Coronary artery  bypass graft  2006    SVG to OM2, SVG to LAD, SVG to DX; (the LIMA did not have good flow and therefore was not used)  . Cardiac catheterization  12/21/2011  . Left heart catheterization with coronary/graft angiogram N/A 12/21/2011    Procedure: LEFT HEART CATHETERIZATION WITH Beatrix Fetters;  Surgeon: Burnell Blanks, MD;  Location: [facility] CATH LAB;  Service: Cardiovascular;  Laterality: N/A;  . Right heart catheterization Bilateral 07/28/2012    Procedure: RIGHT HEART CATH;  Surgeon: Larey Dresser, MD;  Location: [facility] CATH LAB;  Service: Cardiovascular;  Laterality: Bilateral;  . Right heart catheterization N/A 08/11/2012    Procedure: RIGHT HEART CATH;  Surgeon: Thayer Headings, MD;   Location: [facility] CATH LAB;  Service: Cardiovascular;  Laterality: N/A;   Family History  Problem Relation Age of Onset  . Stomach cancer Sister   . Pancreatic cancer Brother   . Lung cancer Brother   . Colon cancer Neg Hx    History  Substance Use Topics  . Smoking status: Former Smoker    Quit date: 09/27/1953  . Smokeless tobacco: Never Used  . Alcohol Use: No    Review of Systems  All other systems reviewed and are negative.     Allergies  Review of patient's allergies indicates no known allergies.  Home Medications   Prior to Admission medications   Medication Sig Start Date End Date Taking? Authorizing Provider  amLODipine (NORVASC) 10 MG tablet Take 10 mg by mouth daily.    Historical Provider, MD  atorvastatin (LIPITOR) 20 MG tablet take 1 tablet by mouth at bedtime 09/14/13   Lelon Perla, MD  carvedilol (COREG) 6.25 MG tablet Take 1 tablet (6.25 mg total) by mouth 2 (two) times daily. 09/05/14   Mauricio Po, FNP  linagliptin (TRADJENTA) 5 MG TABS tablet Take 1 tablet (5 mg total) by mouth daily. 09/30/14   Mauricio Po, FNP  pantoprazole (PROTONIX) 40 MG tablet Take 1 tablet (40 mg total) by mouth daily. 09/30/14   Mauricio Po, FNP  polyethylene glycol powder (GLYCOLAX/MIRALAX) powder Take 1 Container by mouth once.    Historical Provider, MD  potassium chloride SA (K-DUR,KLOR-CON) 20 MEQ tablet Take 20 mEq by mouth 2 (two) times daily.    Historical Provider, MD  sulfamethoxazole-trimethoprim (BACTRIM DS,SEPTRA DS) 800-160 MG per tablet Take 1 tablet by mouth 2 (two) times daily. 09/30/14   Mauricio Po, FNP  vitamin C (ASCORBIC ACID) 500 MG tablet Take 500 mg by mouth 2 (two) times daily.    Historical Provider, MD  Vitamin D, Ergocalciferol, (DRISDOL) 50000 UNITS CAPS capsule Take 50,000 Units by mouth every Friday.     Historical Provider, MD  zinc gluconate 50 MG tablet Take 50 mg by mouth daily.    Historical Provider, MD   BP 157/83 mmHg  Pulse 82   Temp(Src) 97.9 F (36.6 C) (Oral)  Resp 22  SpO2 93% Physical Exam  Constitutional: He appears well-developed and well-nourished. No distress.  HENT:  Head: Normocephalic and atraumatic.  Right Ear: External ear normal.  Left Ear: External ear normal.  Eyes: Conjunctivae are normal. Right eye exhibits no discharge. Left eye exhibits no discharge. No scleral icterus.  Neck: Neck supple. No tracheal deviation present.  Cardiovascular: Normal rate, regular rhythm and intact distal pulses.   Pulmonary/Chest: Accessory muscle usage present. No stridor. No respiratory distress. He has no wheezes. He has rales (Noted bilaterally).  Abdominal: Soft. Bowel sounds are normal. He exhibits no  distension. There is no tenderness. There is no rebound and no guarding.  Musculoskeletal: He exhibits edema. He exhibits no tenderness.  Trace edema of the feet  Neurological: He is alert. He has normal strength. No cranial nerve deficit (no facial droop, extraocular movements intact, no slurred speech) or sensory deficit. He exhibits normal muscle tone. He displays no seizure activity. Coordination normal.  Weakness left upper and left lower extremity  Skin: Skin is warm and dry. No rash noted.  Psychiatric: He has a normal mood and affect.  Nursing note and vitals reviewed.   ED Course  Procedures (including critical care time) Labs Review Labs Reviewed  CBC - Abnormal; Notable for the following:    RBC 3.80 (*)    Hemoglobin 10.4 (*)    HCT 32.2 (*)    RDW 17.3 (*)    Platelets 123 (*)    All other components within normal limits  COMPREHENSIVE METABOLIC PANEL - Abnormal; Notable for the following:    CO2 18 (*)    BUN 30 (*)    Creatinine, Ser 5.11 (*)    GFR calc non Af Amer 10 (*)    GFR calc Af Amer 11 (*)    All other components within normal limits  BRAIN NATRIURETIC PEPTIDE - Abnormal; Notable for the following:    B Natriuretic Peptide 606.7 (*)    All other components within normal  limits  MAGNESIUM  PROTIME-INR  I-STAT TROPOININ, ED    Imaging Review Dg Chest 2 View  10/05/2014   CLINICAL DATA:  Two day history of difficulty breathing  EXAM: CHEST  2 VIEW  COMPARISON:  July 06, 2013  FINDINGS: There is interstitial edema with bilateral pleural effusions and cardiomegaly. There is mild pulmonary venous hypertension. No consolidation. Patient is status post coronary artery bypass grafting. No appreciable adenopathy. No bone lesions.  IMPRESSION: Congestive heart failure.   Electronically Signed   By: Lowella Grip M.D.   On: 10/05/2014 13:17     EKG Interpretation   Date/Time:  Saturday October 05 2014 11:21:11 EST Ventricular Rate:  83 PR Interval:    QRS Duration: 140 QT Interval:  636 QTC Calculation: 748 R Axis:   -55 Text Interpretation:  Atrial fibrillation Nonspecific IVCD with LAD  Inferior infarct, old Lateral leads are also involved Baseline wander in  lead(s) II III aVF a fib has replaced a flutter Confirmed by Delmo Matty  MD-J,  Karimah Winquist ([id number]) on 10/05/2014 11:38:41 AM     Medications  furosemide (LASIX) injection 40 mg (not administered)    MDM   Final diagnoses:  Acute on chronic renal failure  Acute congestive heart failure, unspecified congestive heart failure type    Pt's renal function is significantly worse.  CXR shows CHF.  Sx likely related to fluid overload from his recent transfusion on top of his worsening renal function.  Will give dose of lasix.  Bladder scan to assess for obstruction.  Consult with hospitalist for admission and further treatment.      Dorie Rank, MD 10/05/14 [phone]

## 2014-10-05 NOTE — ED Notes (Signed)
Report given to [facility] RN

## 2014-10-05 NOTE — H&P (Signed)
[facility] History and Physical  [name] QIH:[id number] DOB: [birth year] DOA: 10/05/2014   PCP: Adella Hare, MD    Chief Complaint: shortness of breath  HPI: [name] is a 81 y.o. 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 Per his son who is giving the history, the patient received blood a few days ago ( 2 units) and has since been quite short of breath. The patient is a poor historian. He is noted to have progressive renal failure on labs as well - it is mentioned in outpt notes that he has declined dialysis in the past. On exam he actually appears quite comfortable and is not requiring any oxygen.    General: The patient denies anorexia, fever, + weight loss- pants are loose- unable to quantify Cardiac: Denies chest pain, syncope, palpitations, pedal edema  Respiratory: + cough, shortness of breath, wheezing GI: Denies severe indigestion/heartburn, abdominal pain, nausea, vomiting, diarrhea and constipation GU: Denies hematuria, +incontinence, dysuria  Musculoskeletal: Denies arthritis  Skin: has a sore on his foot for which he is taking Bactrim Neurologic: Denies focal weakness or numbness, change in vision Psychiatry: Denies depression or anxiety.   Past Medical History  Diagnosis Date  . CAD (coronary artery disease)     s/p 3v CABG 2006, myoview 04/2010 EF 49%, prior inferior/apical infarct, no ischemia, LHC 11/2011 stable anatomy (occluded LAD filled from vein graft, distal LCx occluded prior to OM2, OM2 filled from vein graft, RCA prox 25%, mid 40%, distal 25% lesions, SVG to diagonal occluded) Med Rx  . Chronic diastolic heart failure     hx of cardiorenal syndrome;  Echo 06/2012 EF 87-56%, mod diastolic dysfunction, mild MR, mod TR, mild R/LAE,  mild RV dilatation, PASP 55mmHg.  . Carotid stenosis     a. Carotid dopplers R 40-59%, Left 60-79%;   b. carotids 12/13:  4-33% RICA, 29-51% LICA (rpt in 6 mos);  c.  Carotid US (11/14):  R 40-59%; L 60-79%, R vertebral occluded, L vertebral antegrade  . HLD (hyperlipidemia)   . Mediastinal adenopathy     per CT chest in 2006 with PET scan showing very limited metabolic activity; not felt to have a significant neoplastic potential  . Iron deficiency anemia   . Hypertension   . Diabetes mellitus     insulin dependent   . GERD (gastroesophageal reflux disease)   . AV block, Mobitz 1     Noted 11/2011 in hospital, BB stopped  . Colon polyps   . Diverticulosis   . Pulmonary arterial hypertension     a.  RHC 11/1 PASP 60 mmHg (mean 38); b. RHC 11/15 PA pressure 66/24, PCWP 26 and CO 6.2 => Sildenafil started;   c. Echo bubble study 11/13:  no obvious shunt;   d. VQ scan neg for pulmonary embolism  . CKD (chronic kidney disease) stage 5, GFR less than 15 ml/min     hx of cardiorenal syndrome;  Dr Posey Pronto;  AVF recommended and scheduled (patient hesitant to proceed);   Renal US 11/13: diff echogenic kidneys c/w medical renal disease; no hydronephrosis or renal mass  . Hyperparathyroidism, secondary renal   . MGUS (monoclonal gammopathy of unknown significance)     a. Kappa/Lambda free light chain ratio 11/13:  23.99;  b. Met. Bone  survey 11/13:  no osteolytic lesions to suggest MMyeloma  . Atrial flutter     diagnosed 05/2013; Coumadin initiated    Past Surgical History  Procedure Laterality Date  . Appendectomy    . Coronary artery bypass graft  2006    SVG to OM2, SVG to LAD, SVG to DX; (the LIMA did not have good flow and therefore was not used)  . Cardiac catheterization  12/21/2011  . Left heart catheterization with coronary/graft angiogram N/A 12/21/2011    Procedure: LEFT HEART CATHETERIZATION WITH Beatrix Fetters;  Surgeon: Burnell Blanks, MD;  Location: [facility] CATH LAB;   Service: Cardiovascular;  Laterality: N/A;  . Right heart catheterization Bilateral 07/28/2012    Procedure: RIGHT HEART CATH;  Surgeon: Larey Dresser, MD;  Location: [facility] CATH LAB;  Service: Cardiovascular;  Laterality: Bilateral;  . Right heart catheterization N/A 08/11/2012    Procedure: RIGHT HEART CATH;  Surgeon: Thayer Headings, MD;  Location: [facility] CATH LAB;  Service: Cardiovascular;  Laterality: N/A;    Social History: does not smoke cigarettes or drink alcohol Lives at  Home - son or daughter stay with him    No Known Allergies  Family History  Problem Relation Age of Onset  . Stomach cancer Sister   . Pancreatic cancer Brother   . Lung cancer Brother   . Colon cancer Neg Hx      Prior to Admission medications   Medication Sig Start Date End Date Taking? Authorizing Provider  amLODipine (NORVASC) 10 MG tablet Take 10 mg by mouth daily.    Historical Provider, MD  atorvastatin (LIPITOR) 20 MG tablet take 1 tablet by mouth at bedtime 09/14/13   Lelon Perla, MD  carvedilol (COREG) 6.25 MG tablet Take 1 tablet (6.25 mg total) by mouth 2 (two) times daily. 09/05/14   Mauricio Po, FNP  linagliptin (TRADJENTA) 5 MG TABS tablet Take 1 tablet (5 mg total) by mouth daily. 09/30/14   Mauricio Po, FNP  pantoprazole (PROTONIX) 40 MG tablet Take 1 tablet (40 mg total) by mouth daily. 09/30/14   Mauricio Po, FNP  polyethylene glycol powder (GLYCOLAX/MIRALAX) powder Take 1 Container by mouth once.    Historical Provider, MD  potassium chloride SA (K-DUR,KLOR-CON) 20 MEQ tablet Take 20 mEq by mouth 2 (two) times daily.    Historical Provider, MD  sulfamethoxazole-trimethoprim (BACTRIM DS,SEPTRA DS) 800-160 MG per tablet Take 1 tablet by mouth 2 (two) times daily. 09/30/14   Mauricio Po, FNP  vitamin C (ASCORBIC ACID) 500 MG tablet Take 500 mg by mouth 2 (two) times daily.    Historical Provider, MD  Vitamin D, Ergocalciferol, (DRISDOL) 50000 UNITS CAPS capsule Take 50,000 Units by  mouth every Friday.     Historical Provider, MD  zinc gluconate 50 MG tablet Take 50 mg by mouth daily.    Historical Provider, MD     Physical Exam: Filed Vitals:   10/05/14 1126 10/05/14 1204 10/05/14 1256  BP: 157/83 164/77 175/80  Pulse: 82 74 79  Temp: 97.9 F (36.6 C)    TempSrc: Oral    Resp: _0 SpO2: 93% 94% 94%     General: awake and alert- no distress HEENT: Normocephalic and Atraumatic, Mucous membranes pink                PERRLA; EOM intact; No scleral icterus,                 Nares: Patent, Oropharynx: Clear, Fair Dentition  Neck: FROM, no cervical lymphadenopathy, thyromegaly, carotid bruit or JVD;  Breasts: deferred CHEST WALL: No tenderness  CHEST: Normal respiration, clear to auscultation bilaterally - pulse ox 92-94% on room air HEART: Regular rate and rhythm; no murmurs rubs or gallops  BACK: No kyphosis or scoliosis; no CVA tenderness  ABDOMEN: Positive Bowel Sounds, soft, non-tender; no masses, no organomegaly Rectal Exam: deferred EXTREMITIES: No cyanosis, clubbing, or edema Genitalia: not examined  SKIN:  Foot ulcer noted- left foot CNS: Alert and Oriented x 4, unable to move left arm or left leg CN 2-12 intact  Labs on Admission:  Basic Metabolic Panel:  Recent Labs Lab 09/30/14 1437 10/02/14 1326 10/05/14 1144  NA 139 140 138  K 4.4 4.4 4.8  CL 110 111 111  CO2 20 21 18*  GLUCOSE 87 86 87  BUN 31* 30* 30*  CREATININE 4.0* 4.41* 5.11*  CALCIUM 9.5 9.9 9.8  MG  --   --  2.0  PHOS  --  3.4  --    Liver Function Tests:  Recent Labs Lab 10/02/14 1326 10/05/14 1144  AST  --  20  ALT  --  <5  ALKPHOS  --  76  BILITOT  --  0.7  PROT  --  7.2  ALBUMIN 3.5 3.6   No results for input(s): LIPASE, AMYLASE in the last 168 hours. No results for input(s): AMMONIA in the last 168 hours. CBC:  Recent Labs Lab 09/30/14 1437 10/02/14 1316 10/05/14 1144  WBC 4.3  --  4.0  HGB 7.8 Repeated and verified X2.* 7.3*  10.4*  HCT 25.0*  --  32.2*  MCV 90.0  --  84.7  PLT 132.0*  --  123*   Cardiac Enzymes: No results for input(s): CKTOTAL, CKMB, CKMBINDEX, TROPONINI in the last 168 hours.  BNP (last 3 results) No results for input(s): PROBNP in the last 8760 hours. CBG: No results for input(s): GLUCAP in the last 168 hours.  Radiological Exams on Admission: Dg Chest 2 View  10/05/2014   CLINICAL DATA:  Two day history of difficulty breathing  EXAM: CHEST  2 VIEW  COMPARISON:  July 06, 2013  FINDINGS: There is interstitial edema with bilateral pleural effusions and cardiomegaly. There is mild pulmonary venous hypertension. No consolidation. Patient is status post coronary artery bypass grafting. No appreciable adenopathy. No bone lesions.  IMPRESSION: Congestive heart failure.   Electronically Signed   By: Bretta Bang M.D.   On: 10/05/2014 13:17    EKG: Independently reviewed. A-fib - rate controlled  Assessment/Plan Principal Problem:   Fluid overload due to blood transfusion - this is likely due to progressive renal failure and left and right heart failure - due to severity of CKD, will need to start lasix infusion - if unable to diurese over the next 48 hrs, dialysis will be the only option- will not call nephrology today  Active Problems:   Type II diabetes mellitus with manifestations - cont Linagliptan- follow on sliding scale as well    CKD (chronic kidney disease) stage 5, GFR less than 15 ml/min - see above- call renal if unable to diurese- not uremic     Diastolic CHF, acute on chronic - cont B Blocker- avoid ACE I due to renal failure  HTN - add Imdur and Hydralazine to decrease afterload  H/o hemorrhagic CVA - bedbound - on statin  Diabetic foot ulcer - cont Bactrim - call wound care  A-fib - rate controlled- not on anticoagulation as  coumadin was stopped due to ICA - will start a baby aspirin - should go back on Coumadin as it has been a few months since the CVA-  will need to discuss with cards and neurology   Consulted: none  Code Status: full code  Family Communication:  DVT Prophylaxis:Heparin  Time spent: 45 min  [place], MD [facility]  If 7PM-7AM, please contact night-coverage [email or web address] 10/05/2014, 2:13 PM

## 2014-10-06 LAB — CBC
HCT: 29 % — ABNORMAL LOW (ref 39.0–52.0)
Hemoglobin: 9.5 g/dL — ABNORMAL LOW (ref 13.0–17.0)
MCH: 28.4 pg (ref 26.0–34.0)
MCHC: 32.8 g/dL (ref 30.0–36.0)
MCV: 86.6 fL (ref 78.0–100.0)
Platelets: 81 10*3/uL — ABNORMAL LOW (ref 150–400)
RBC: 3.35 MIL/uL — AB (ref 4.22–5.81)
RDW: 17.3 % — ABNORMAL HIGH (ref 11.5–15.5)
WBC: 3.1 10*3/uL — AB (ref 4.0–10.5)

## 2014-10-06 LAB — GLUCOSE, CAPILLARY
GLUCOSE-CAPILLARY: 90 mg/dL (ref 70–99)
GLUCOSE-CAPILLARY: 91 mg/dL (ref 70–99)
GLUCOSE-CAPILLARY: 88 mg/dL (ref 70–99)

## 2014-10-06 LAB — BASIC METABOLIC PANEL
Anion gap: 9 (ref 5–15)
BUN: 32 mg/dL — ABNORMAL HIGH (ref 6–23)
CHLORIDE: 112 meq/L (ref 96–112)
CO2: 16 mmol/L — ABNORMAL LOW (ref 19–32)
Calcium: 9.2 mg/dL (ref 8.4–10.5)
Creatinine, Ser: 5.09 mg/dL — ABNORMAL HIGH (ref 0.50–1.35)
GFR calc Af Amer: 11 mL/min — ABNORMAL LOW (ref >90)
GFR, EST NON AFRICAN AMERICAN: 10 mL/min — AB (ref >90)
Glucose, Bld: 97 mg/dL (ref 70–99)
Potassium: 4.2 mmol/L (ref 3.5–5.1)
Sodium: 137 mmol/L (ref 135–145)

## 2014-10-06 MED ORDER — BISACODYL 10 MG RE SUPP
10.0000 mg | Freq: Once | RECTAL | Status: DC
  Filled 2014-10-06: qty 1

## 2014-10-06 MED ORDER — LACTULOSE 10 GM/15ML PO SOLN
20.0000 g | Freq: Once | ORAL | Status: AC
  Administered 2014-10-06: 20 g via ORAL
  Filled 2014-10-06: qty 30

## 2014-10-06 MED ORDER — DOCUSATE SODIUM 100 MG PO CAPS
100.0000 mg | ORAL_CAPSULE | Freq: Two times a day (BID) | ORAL | Status: DC
  Administered 2014-10-06 – 2014-10-07 (×3): 100 mg via ORAL
  Filled 2014-10-06 (×4): qty 1

## 2014-10-06 MED ORDER — FUROSEMIDE 40 MG PO TABS
40.0000 mg | ORAL_TABLET | Freq: Every day | ORAL | Status: DC
  Administered 2014-10-06 – 2014-10-07 (×2): 40 mg via ORAL
  Filled 2014-10-06 (×3): qty 1

## 2014-10-06 NOTE — Plan of Care (Signed)
Problem: Phase I Progression Outcomes Goal: EF % per last Echo/documented,Core Reminder form on chart Outcome: Completed/Met Date Met:  10/06/14 EF 50-55%(sept 2015)

## 2014-10-06 NOTE — Progress Notes (Signed)
[facility] PROGRESS NOTE  [name] GYI:[id number] DOB: October 31, 1930 DOA: 10/05/2014 PCP: Adella Hare, MD  Assessment/Plan: 1. Acute pulmonary edema -Likely secondary to volume overloaded state. He recently received a blood transfusion with 2 units packed red cells after which he became short of breath. -Patient was treated with IV Lasix overnight, feeling better this morning. He is satting 93% on room air and breathing comfortably  -His weight came down by 2 pounds  -Will transition to Lasix 40 mg by mouth daily -Will monitor for the next 24 hours should he remain stable.   2.  Type 2 diabetes mellitus. -Blood sugars remain stable -Continue tradjenta 5 mg by mouth daily  3.  Hypertension -Blood pressure stable, continue Coreg 6.25 mg by mouth twice a day and amlodipine 10 mg by mouth daily -Last blood pressure 136/67  4.  Stage V chronic kidney disease. -Patient making urine, diurese 1.125 L overnight with IV Lasix -Of a fluid overload state resulted from a blood transfusion -Transitioned to Lasix 40 mg by mouth daily  Code Status: full code  Family Communication:  Disposition Plan: Monitor for the next 24 hours to assure stability    HPI/Subjective: Patient is an 81 year old with with multiple comorbidities including a past medical history of coronary disease, status post coronary artery bypass grafting, admitted to medicine service on 10/05/2014 when he presented with complaints of shortness of breath. Initially worked up with chest x-ray which showed findings suggestive of congestive heart failure. Patient recently receiving a blood transfusion with 2 units packed red blood cells after which he became short of breath. It was suspected that shortness breath was secondary to volume overloaded state/pulmonary edema from blood transfusion for which she was started on IV Lasix. Patient diuresing 1.1 L over his first night. Kidney function remained stable remain  stable.  Objective: Filed Vitals:   10/06/14 1420  BP: 136/67  Pulse: 65  Temp: 97.3 F (36.3 C)  Resp: 20    Intake/Output Summary (Last 24 hours) at 10/06/14 1426 Last data filed at 10/06/14 1244  Gross per 24 hour  Intake 1496.51 ml  Output   1700 ml  Net -203.49 ml   Filed Weights   10/05/14 1500 10/06/14 0520  Weight: 80.5 kg (177 lb 7.5 oz) 79.2 kg (174 lb 9.7 oz)    Exam:   General:  Patient is awake and alert, as following commands, however is confused and disoriented   Cardiovascular: regular rate and rhythm normal S1-S2   Respiratory: clear to auscultation bilaterally, has a few by basilar crackles  Abdomen: Soft nontender nondistended   Musculoskeletal: 1+ bilateral extremity pitting edema    Data Reviewed: Basic Metabolic Panel:  Recent Labs Lab 09/30/14 1437 10/02/14 1326 10/05/14 1144 10/06/14 0412  NA 139 140 138 137  K 4.4 4.4 4.8 4.2  CL 110 111 111 112  CO2 20 21 18* 16*  GLUCOSE 87 86 87 97  BUN 31* 30* 30* 32*  CREATININE 4.0* 4.41* 5.11* 5.09*  CALCIUM 9.5 9.9 9.8 9.2  MG  --   --  2.0  --   PHOS  --  3.4  --   --    Liver Function Tests:  Recent Labs Lab 10/02/14 1326 10/05/14 1144  AST  --  20  ALT  --  <5  ALKPHOS  --  76  BILITOT  --  0.7  PROT  --  7.2  ALBUMIN 3.5 3.6   No results for input(s): LIPASE, AMYLASE  in the last 168 hours. No results for input(s): AMMONIA in the last 168 hours. CBC:  Recent Labs Lab 09/30/14 1437 10/02/14 1316 10/05/14 1144 10/06/14 0412  WBC 4.3  --  4.0 3.1*  HGB 7.8 Repeated and verified X2.* 7.3* 10.4* 9.5*  HCT 25.0*  --  32.2* 29.0*  MCV 90.0  --  84.7 86.6  PLT 132.0*  --  123* 81*   Cardiac Enzymes: No results for input(s): CKTOTAL, CKMB, CKMBINDEX, TROPONINI in the last 168 hours. BNP (last 3 results) No results for input(s): PROBNP in the last 8760 hours. CBG:  Recent Labs Lab 10/05/14 1612 10/05/14 2338 10/06/14 0624 10/06/14 1245  GLUCAP 84 151* 90 91     No results found for this or any previous visit (from the past 240 hour(s)).   Studies: Dg Chest 2 View  10/05/2014   CLINICAL DATA:  Two day history of difficulty breathing  EXAM: CHEST  2 VIEW  COMPARISON:  July 06, 2013  FINDINGS: There is interstitial edema with bilateral pleural effusions and cardiomegaly. There is mild pulmonary venous hypertension. No consolidation. Patient is status post coronary artery bypass grafting. No appreciable adenopathy. No bone lesions.  IMPRESSION: Congestive heart failure.   Electronically Signed   By: Lowella Grip M.D.   On: 10/05/2014 13:17    Scheduled Meds: . amLODipine  10 mg Oral Daily  . aspirin  81 mg Oral Daily  . atorvastatin  20 mg Oral QHS  . bisacodyl  10 mg Rectal Once  . carvedilol  6.25 mg Oral BID  . docusate sodium  100 mg Oral BID  . ferrous sulfate  325 mg Oral Q breakfast  . heparin  5,000 Units Subcutaneous 3 times per day  . linagliptin  5 mg Oral Daily  . pantoprazole  40 mg Oral Daily  . sodium chloride  3 mL Intravenous Q12H  . sulfamethoxazole-trimethoprim  1 tablet Oral Daily  . vitamin C  500 mg Oral BID  . [START ON 10/11/2014] Vitamin D (Ergocalciferol)  50,000 Units Oral Q Fri  . zinc sulfate  220 mg Oral Daily   Continuous Infusions:   Principal Problem:   Fluid overload due to blood transfusion Active Problems:   Type II diabetes mellitus with manifestations   CKD (chronic kidney disease) stage 5, GFR less than 15 ml/min   Diastolic CHF, acute on chronic    Time spent: 35 min    [name], [facility] Pager [phone]. If 7PM-7AM, please contact night-coverage at [email or web address], password [facility] 10/06/2014, 2:26 PM  LOS: 1 day

## 2014-10-06 NOTE — Progress Notes (Signed)
Case manager on call notified that patient's son requesting walker and hospital bed.  Dr. Coralyn Pear ordered hospital bed, PT and OT consult to assess for walker and other needs.  Patient and son updated and aware.  Will continue to monitor.

## 2014-10-07 ENCOUNTER — Ambulatory Visit: Payer: Medicare HMO | Admitting: Family

## 2014-10-07 ENCOUNTER — Telehealth: Payer: Self-pay | Admitting: *Deleted

## 2014-10-07 DIAGNOSIS — I369 Nonrheumatic tricuspid valve disorder, unspecified: Secondary | ICD-10-CM

## 2014-10-07 DIAGNOSIS — J810 Acute pulmonary edema: Secondary | ICD-10-CM

## 2014-10-07 LAB — BASIC METABOLIC PANEL
Anion gap: 7 (ref 5–15)
BUN: 32 mg/dL — ABNORMAL HIGH (ref 6–23)
CHLORIDE: 108 meq/L (ref 96–112)
CO2: 21 mmol/L (ref 19–32)
Calcium: 8.9 mg/dL (ref 8.4–10.5)
Creatinine, Ser: 5.49 mg/dL — ABNORMAL HIGH (ref 0.50–1.35)
GFR calc non Af Amer: 9 mL/min — ABNORMAL LOW (ref >90)
GFR, EST AFRICAN AMERICAN: 10 mL/min — AB (ref >90)
Glucose, Bld: 86 mg/dL (ref 70–99)
POTASSIUM: 4.1 mmol/L (ref 3.5–5.1)
Sodium: 136 mmol/L (ref 135–145)

## 2014-10-07 LAB — GLUCOSE, CAPILLARY
GLUCOSE-CAPILLARY: 103 mg/dL — AB (ref 70–99)
Glucose-Capillary: 80 mg/dL (ref 70–99)

## 2014-10-07 MED ORDER — FERROUS SULFATE 325 (65 FE) MG PO TABS: 325.0000 mg | ORAL_TABLET | Freq: Every day | ORAL | Status: DC

## 2014-10-07 MED ORDER — DSS 100 MG PO CAPS: 100.0000 mg | ORAL_CAPSULE | Freq: Two times a day (BID) | ORAL | Status: DC

## 2014-10-07 MED ORDER — ASPIRIN 81 MG PO CHEW: 81.0000 mg | CHEWABLE_TABLET | Freq: Every day | ORAL | Status: DC

## 2014-10-07 MED ORDER — COLLAGENASE 250 UNIT/GM EX OINT
1.0000 "application " | TOPICAL_OINTMENT | Freq: Every day | CUTANEOUS | Status: DC
  Administered 2014-10-07: 1 via TOPICAL
  Filled 2014-10-07: qty 30

## 2014-10-07 NOTE — Evaluation (Signed)
Occupational Therapy Evaluation Patient Details Name: [name] MRN: [id number] DOB: [birth year] Today's Date: 10/07/2014    History of Present Illness Pt admitted on 10/05/14, 81 y.o. 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 Per his son who is giving the history, the patient received blood a few days ago ( 2 units) and has since been quite short of breath. The patient is a poor historian. He is noted to have progressive renal failure on labs as well - it is mentioned in outpt notes that he has declined dialysis in the past. On exam he actually appears quite comfortable and is not requiring any oxygen.    Clinical Impression   Pt was assessed for OT acute care needs, he appears to be at baseline level as he is non-ambulatory and only performs SPT from EOB to 3:1 during day per his report. His son does all homemaking and meal prep, he states that he has 3:1, but should benefit from drop arm 3:1 for safety during transfers as well as hoyer lift. No family present, will sign off acute OT..    Follow Up Recommendations  No OT follow up;Supervision/Assistance - 24 hour    Equipment Recommendations  Hospital bed;Other (comment) (Drop arm 3:1 and lift for home use/transfers)    Recommendations for Other Services       Precautions / Restrictions Precautions Precautions: Fall;Other (comment) (Pt reports that he is bed bound, son assist him) Restrictions Weight Bearing Restrictions: No      Mobility Bed Mobility Overal bed mobility: Needs Assistance Bed Mobility: Supine to Sit;Sit to Supine     Supine to sit: Mod assist Sit to supine: Max assist   General bed mobility comments: Assist with trunk and w/ chair pad to sitting at EOB;  Getting back to bed using rails on bed w/  bed tilted for gravity assistance and +1 for LUE/LLE & repositioning in bed.  Transfers Overall transfer level: Needs assistance Equipment used: 2 person hand held assist (LLE blocked in standing, pt unable to maintain standing for long (<40 seconds) before RLE buckled and pt assisted back to sit at EOB.  "That leg is no good either") Transfers: Sit to/from [facility] Sit to Stand: Mod assist;Max assist Stand pivot transfers: Total assist;+2 physical assistance       General transfer comment: See above comments. Currently recommed lift for staff and during transfers at home for pt/family safety     Balance Overall balance assessment: Needs assistance Sitting-balance support: Single extremity supported;Feet supported Sitting balance-Leahy Scale: Good     Standing balance support: Single extremity supported Standing balance-Leahy Scale: Poor Standing balance comment: LLE blocked at knee during sit to stand, Pt unable to maintain static standing today (<40 seconds noted) before RLE buckled and pt assisted back to bed).                             ADL Overall ADL's : At baseline;Needs assistance/impaired Eating/Feeding: Set up;Sitting;Bed level   Grooming: Wash/dry face;Wash/dry hands;Set up;Supervision/safety;Bed level;Sitting   Upper Body Bathing: Minimal assitance;Bed level;Sitting   Lower Body Bathing: Bed level;Maximal assistance   Upper Body Dressing : Moderate assistance;Bed level   Lower Body Dressing: Bed level;Maximal assistance   Toilet Transfer: +2  for physical assistance;Stand-pivot;BSC;With caregiver independent assisting;Requires drop arm;Moderate assistance   Toileting- Clothing Manipulation and Hygiene: Maximal assistance;+2 for physical assistance;Sit to/from Nurse, children's Details (indicate cue type and reason): NT pt states that he sponge bathes at bed levelw/ son assisting PRN. States he does not get into  shower Functional mobility during ADLs:  (Pt is non-ambulatory, his son assists w/ SPT from EOB to w/c or 3:1 , sometimes recliner at home. Pt was +1 Max A sit to stand EOB and +2 Max A SPT to 3:1 simulated) General ADL Comments: Pt was assessed for OT acute care needs, he appears to be at baseline level as he is non-ambulatory and only performs SPT from EOB to 3:1 during day per his report. His son does all homemaking and meal prep, he states that he has 3:1, but may benefit from drop arm 3:1 for safety during transfers as well as hoyer lift. No family present.     Vision  Pt has glasses, reports no changes                   Perception     Praxis      Pertinent Vitals/Pain Pain Assessment: No/denies pain Pain Score: 0-No pain     Hand Dominance Right   Extremity/Trunk Assessment Upper Extremity Assessment Upper Extremity Assessment: LUE deficits/detail LUE Deficits / Details: LUE hemiplegic secondary to CVA 9/15; general PROM WFL's, limited at end range shoulder flexion. LUE Coordination:  (No AROM due to hemiplegia, PROM WFL's, edema noted, hand elevated on pillow in bed)   Lower Extremity Assessment Lower Extremity Assessment: Defer to PT evaluation       Communication Communication Communication: No difficulties   Cognition Arousal/Alertness: Awake/alert Behavior During Therapy: WFL for tasks assessed/performed Overall Cognitive Status: History of cognitive impairments - at baseline (No family present )                     General Comments       Exercises       Shoulder Instructions      Home Living Family/patient expects to be discharged to:: Private residence Living Arrangements: Children Available Help at Discharge: Family Type of Home: House Home Access: Level entry     Home Layout: Two level;Able to live on main level with bedroom/bathroom     Bathroom Shower/Tub: Occupational psychologist: Standard Bathroom Accessibility:  Yes   Home Equipment: Wheelchair - manual;Bedside commode          Prior Functioning/Environment Level of Independence: Needs assistance  Gait / Transfers Assistance Needed: Pt reports that he is bed bound, non-ambulatory. He states that his son or other family member assists w/ all transfers (bed to 3:1 vs w/c only). ADL's / Homemaking Assistance Needed: Pt reports that son does all homemaking and cooks/cleans Communication / Swallowing Assistance Needed: WFL's      OT Diagnosis: Generalized weakness;Paresis;Hemiplegia non-dominant side   OT Problem List: Other (comment) (Pt appears at baseline level)   OT Treatment/Interventions:      OT Goals(Current goals can be found in the care plan section) Acute Rehab OT Goals Patient Stated Goal: none stated OT Goal Formulation: All assessment and education complete, DC therapy (Pt appears at baseline level of care, see equipment recommendations for safety)  OT Frequency:     Barriers to D/C:            Co-evaluation  End of Session Equipment Utilized During Treatment: Gait belt;Other (comment) (LLE blocked at knee) Nurse Communication: Mobility status;Need for lift equipment;Other (comment) (Rec lift for safety acutely and at home, drop arm 3:1)  Activity Tolerance: Patient tolerated treatment well Patient left: in bed;with call bell/phone within reach;with bed alarm set   Time: 1000-1033 OT Time Calculation (min): 33 min Charges:  OT General Charges $OT Visit: 1 Procedure OT Evaluation $Initial OT Evaluation Tier I: 1 Procedure OT Treatments $Self Care/Home Management : 8-22 mins G-Codes:    Josephine Igo Dixon, OTR/L 10/07/2014, 10:56 AM

## 2014-10-07 NOTE — Progress Notes (Signed)
  Echocardiogram 2D Echocardiogram has been performed.  Lancelot Alyea FRANCES 10/07/2014, 8:53 AM

## 2014-10-07 NOTE — Progress Notes (Signed)
1100 reasigned from Taylor<RN Pt awake ,alert and coherent . Forgetful at times . Follows verbal commands apprpriately. Family in attendance . Aware of plans of care.

## 2014-10-07 NOTE — Progress Notes (Signed)
1520 To home via wheelchair . Discharged with family . Discharge instructions given to son . Verbalized understanding

## 2014-10-07 NOTE — Consult Note (Addendum)
WOC wound consult note Reason for Consult: Pt states he is followed by home health for bilat heels prior to admission. Wound type: Chronic pressure ulcers to bilat heels Pressure Ulcer POA: Yes Measurement: Left heel with unstageable ; 4X2.5cm, 50% black soft eschar, 50% pink and moist.  No odor, small amt tan drainage. Pink dry scar tissue surrounding where previous wound has decreased in size and is healing. Right heel with stage 2 wound; 2X2X.1cm, pink and dry, no odor or drainage. Pink dry scar tissue surrounding where  previous wound has decreased in size and is healing. Dressing procedure/placement/frequency: Pt has heel lift boots in place to reduce pressure.  Foam dressing to protect and promote healing to right heel.  Santyl ointment for chenmical debridement of nonviable tissue to left heel.  Discussed plan of care with pt and he denies further questions.  He can resume follow-up with home health for dressing change assistance after discharge. Please re-consult if further assistance is needed.  Thank-you,  Julien Girt MSN, [place], [employer], [place], [place]

## 2014-10-07 NOTE — Evaluation (Signed)
Physical Therapy Evaluation Patient Details Name: [name] MRN: [id number] DOB: [birth year] Today's Date: 10/07/2014   History of Present Illness  Pt adm with acute pulmonary edema. 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   Clinical Impression  Pt admitted with above diagnosis. Pt currently with functional limitations due to the deficits listed below (see PT Problem List).  Pt will benefit from skilled PT to increase their independence and safety with mobility to allow discharge to the venue listed below.       Follow Up Recommendations Home health PT    Equipment Recommendations  Hospital bed;Other (comment) (hoyer lift)    Recommendations for Other Services       Precautions / Restrictions Precautions Precautions: Fall      Mobility  Bed Mobility Overal bed mobility: Needs Assistance Bed Mobility: Supine to Sit;Sit to Supine     Supine to sit: +2 for physical assistance;Mod assist Sit to supine: +2 for physical assistance;Max assist   General bed mobility comments: Assist to bring legs off and to elevate trunk.  Transfers Overall transfer level: Needs assistance Equipment used: Ambulation equipment used Charlaine Dalton) Transfers: Sit to/from Stand Sit to Stand: +2 physical assistance;Max assist;Mod assist Stand pivot transfers: Total assist;+2 physical assistance       General transfer comment: Pt stood with Stedy. Assist to bring hips up. Pt refused transfer to chair.   Ambulation/Gait                Stairs            Wheelchair Mobility    Modified Rankin (Stroke Patients Only)       Balance Overall balance assessment: Needs assistance Sitting-balance support: Single extremity supported;Feet supported Sitting balance-Leahy Scale: Good     Standing balance support: Single extremity supported Standing  balance-Leahy Scale: Poor Standing balance comment: Stood with Stedy with 1 minute.                             Pertinent Vitals/Pain Pain Assessment: No/denies pain Pain Score: 0-No pain    Home Living Family/patient expects to be discharged to:: Private residence Living Arrangements: Children Available Help at Discharge: Family Type of Home: House Home Access: Level entry     Home Layout: Two level;Able to live on main level with bedroom/bathroom Home Equipment: Wheelchair - manual;Bedside commode      Prior Function Level of Independence: Needs assistance   Gait / Transfers Assistance Needed: Pt reports that he is bed bound, non-ambulatory. He states that his son or other family member assists w/ all transfers (bed to 3:1 vs w/c only).  ADL's / Homemaking Assistance Needed: Pt reports that son does all homemaking and cooks/cleans        Hand Dominance   Dominant Hand: Right    Extremity/Trunk Assessment   Upper Extremity Assessment: Defer to OT evaluation       LUE Deficits / Details: LUE hemiplegic secondary to CVA 9/15; general PROM WFL's, limited at end range shoulder flexion.   Lower Extremity Assessment: Generalized weakness;LLE deficits/detail   LLE Deficits / Details: Less than 3/5 due to stroke.     Communication   Communication: No difficulties  Cognition Arousal/Alertness: Awake/alert Behavior During Therapy: WFL for tasks assessed/performed Overall Cognitive Status: History of cognitive impairments - at baseline (No family  present )                      General Comments      Exercises        Assessment/Plan    PT Assessment Patient needs continued PT services  PT Diagnosis Hemiplegia non-dominant side   PT Problem List Decreased strength;Decreased knowledge of use of DME;Decreased knowledge of precautions;Decreased balance;Decreased mobility;Decreased activity tolerance;Decreased cognition  PT Treatment  Interventions DME instruction;Balance training;Functional mobility training;Therapeutic activities;Therapeutic exercise;Patient/family education;Neuromuscular re-education   PT Goals (Current goals can be found in the Care Plan section) Acute Rehab PT Goals Patient Stated Goal: none stated PT Goal Formulation: With patient Time For Goal Achievement: 10/21/14 Potential to Achieve Goals: Fair    Frequency Min 2X/week   Barriers to discharge        Co-evaluation               End of Session Equipment Utilized During Treatment: Gait belt;Other (comment) Charlaine Dalton) Activity Tolerance: Patient tolerated treatment well Patient left: in bed;with call bell/phone within reach Nurse Communication: Mobility status;Need for lift equipment         Time: 2330-0762 PT Time Calculation (min) (ACUTE ONLY): 14 min   Charges:   PT Evaluation $Initial PT Evaluation Tier I: 1 Procedure PT Treatments $Therapeutic Activity: 8-22 mins   PT G Codes:        Thuan Tippett 2014/10/28, 12:49 PM  [facility] PT [phone]

## 2014-10-07 NOTE — Care Management Note (Signed)
    Page 1 of 2   10/07/2014     4:08:11 PM CARE MANAGEMENT NOTE 10/07/2014  Patient:  [name], [name]   Account Number:  [id number]  Date Initiated:  10/07/2014  Documentation initiated by:  [name]  Subjective/Objective Assessment:   Pt adm on 10/05/13 with CHF.  PTA, pt resides at home with son, who is caregiver.  Pt has hx of CVA and is bedbound. He is active with [facility] care for home health needs.     Action/Plan:   Pt for dc home with son today and [facility] services as prior to admission.  Son requesting hospital bed for home.  Will resume services, per son's request, and arrange for DME as requested.   Anticipated DC Date:  10/07/2014   Anticipated DC Plan:  [name]  CM consult      [facility] Choice  HOME HEALTH   Choice offered to / List presented to:  C-2 HC POA / Guardian   DME arranged  HOSPITAL BED      DME agency  [place] arranged  HH-1 RN  [place]      [place] agency  [place]   Status of service:  Completed, signed off Medicare Important Message given?  NA - LOS <3 / Initial given by admissions (If response is "NO", the following Medicare IM given date fields will be blank) Date Medicare IM given:   Medicare IM given by:   Date Additional Medicare IM given:   Additional Medicare IM given by:    Discharge Disposition:  [place]  Per UR Regulation:  Reviewed for med. necessity/level of care/duration of stay  If discussed at [place] of Stay Meetings, dates discussed:    Comments:  10/07/14 Ellan Lambert, RN, BSN [phone] Spoke with pt's son, [name], [name] by phone to discuss dc arrangements.(cell [phone])    Son states he cares for pt at home with assistance of other family members and [facility].  He wishes to continue wiht this agency for [facility] needs.  He requests hospital bed for home. Discussed other  OT recommendations for DME, including hoyer lift and drop-arm commode, but son declines; states only wants bed at this time.  Referral to [facility] for DME needs. Confirmed address and phone # for bed delivery: address: [address], [facility]                 Call son's cell # [phone].

## 2014-10-07 NOTE — Telephone Encounter (Signed)
[place] Night - Client [place] Patient Name: KRISTOFFER BALA Gender: Male DOB: 05/03/1931 Age: 80 Y 26 M 28 D Return Phone Number: Address: City/State/Zip: [place] [employer] [facility] [place] Night - Client Client Site [place] - Night Contact Type Call Caller Name [place] Phone Number [phone] Relationship To Patient Son Is this call to report lab results? No Call Type General Information Initial Comment Caller states that he is needing to cancel his dads appointment for tomorrow. His dad in the hospital and can't make it. General Information Type Appointment Nurse Assessment Guidelines Guideline Title Affirmed Question Affirmed Notes Nurse Date/Time (Eastern Time) Disp. Time Eilene Ghazi Time) Disposition Final User 10/06/2014 5:16:13 PM General Information Provided Yes Nickie Retort After Care Instructions Given Call Event Type User Date / Time Description

## 2014-10-07 NOTE — Discharge Summary (Addendum)
Physician Discharge Summary  [name] TDV:[id number] DOB: [birth year] DOA: 10/05/2014  PCP: Adella Hare, MD  Admit date: 10/05/2014 Discharge date: 10/07/2014  Time spent: 35 minutes  Recommendations for Outpatient Follow-up:  1. Please monitor volume status, he developed fluid overload from blood transfusion 2. BMP in 5 days 3. Home Health Services were set up prior to discharge for home PT, RN and Aide  Discharge Diagnoses:  Principal Problem:   Fluid overload due to blood transfusion Active Problems:   Type II diabetes mellitus with manifestations   CKD (chronic kidney disease) stage 5, GFR less than 15 ml/min   Diastolic CHF, acute on chronic   Discharge Condition: Stable  Diet recommendation:   Filed Weights   10/05/14 1500 10/06/14 0520 10/07/14 0612  Weight: 80.5 kg (177 lb 7.5 oz) 79.2 kg (174 lb 9.7 oz) 78.951 kg (174 lb 0.9 oz)    History of present illness:  [name] is a 81 y.o. 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 Per his son who is giving the history, the patient received blood a few days ago ( 2 units) and has since been quite short of breath. The patient is a poor historian. He is noted to have progressive renal failure on labs as well - it is mentioned in outpt notes that he has declined dialysis in the past. On exam he actually appears quite comfortable and is not requiring any oxygen.   Hospital Course:  Patient is an 81 year old with with multiple comorbidities including a past medical history of coronary disease, status post coronary artery bypass grafting, admitted to medicine service on 10/05/2014 when he presented with complaints of shortness of breath. Initially worked up with chest x-ray which showed findings suggestive of  congestive heart failure. Patient recently receiving a blood transfusion with 2 units packed red blood cells after which he became short of breath. It was suspected that shortness breath was secondary to volume overloaded state/pulmonary edema from blood transfusion for which she was started on IV Lasix. Patient diuresing 1.1 L over his first night. Kidney function remained stable remain stable.   Acute pulmonary edema -Likely secondary to volume overloaded state. He recently received a blood transfusion with 2 units packed red cells after which he became short of breath. -Patient was treated with IV Lasix overnight, feeling better this morning. He is satting 93% on room air and breathing comfortably  -His weight came down by 2 pounds   2. Type 2 diabetes mellitus. -Blood sugars remain stable -Continue tradjenta 5 mg by mouth daily  3. Hypertension -Blood pressure stable, continue Coreg 6.25 mg by mouth twice a day and amlodipine 10 mg by mouth daily  4. Stage V chronic kidney disease. -Stable -Treated for fluid overload state resulted from a blood transfusion   Discharge Exam: Filed Vitals:   10/07/14 0940  BP: 154/74  Pulse:   Temp:   Resp:      General: Patient is awake and alert, as following commands, however is confused and disoriented   Cardiovascular: regular rate and rhythm normal S1-S2   Respiratory: clear to auscultation bilaterally, has a few by basilar crackles  Abdomen: Soft nontender nondistended   Musculoskeletal: no edema  Discharge Instructions   Discharge Instructions    Call MD for:  difficulty breathing, headache or visual disturbances    Complete by:  As directed      Call MD for:  extreme fatigue    Complete by:  As directed      Call MD for:  hives    Complete by:  As directed      Call MD for:  persistant dizziness or light-headedness    Complete by:  As directed      Call MD for:  persistant nausea and vomiting    Complete by:  As  directed      Call MD for:  redness, tenderness, or signs of infection (pain, swelling, redness, odor or green/yellow discharge around incision site)    Complete by:  As directed      Call MD for:  severe uncontrolled pain    Complete by:  As directed      Call MD for:  temperature >100.4    Complete by:  As directed      Diet - low sodium heart healthy    Complete by:  As directed      Increase activity slowly    Complete by:  As directed           Current Discharge Medication List    START taking these medications   Details  aspirin 81 MG chewable tablet Chew 1 tablet (81 mg total) by mouth daily. Qty: 30 tablet, Refills: 0    docusate sodium 100 MG CAPS Take 100 mg by mouth 2 (two) times daily. Qty: 10 capsule, Refills: 0      CONTINUE these medications which have CHANGED   Details  ferrous sulfate 325 (65 FE) MG tablet Take 1 tablet (325 mg total) by mouth daily with breakfast. Qty: 30 tablet, Refills: 3      CONTINUE these medications which have NOT CHANGED   Details  amLODipine (NORVASC) 10 MG tablet Take 10 mg by mouth daily.    atorvastatin (LIPITOR) 20 MG tablet take 1 tablet by mouth at bedtime Qty: 30 tablet, Refills: 3    carvedilol (COREG) 6.25 MG tablet Take 1 tablet (6.25 mg total) by mouth 2 (two) times daily. Qty: 180 tablet, Refills: 1    linagliptin (TRADJENTA) 5 MG TABS tablet Take 1 tablet (5 mg total) by mouth daily. Qty: 30 tablet, Refills: 3   Associated Diagnoses: Type II diabetes mellitus with manifestations    pantoprazole (PROTONIX) 40 MG tablet Take 1 tablet (40 mg total) by mouth daily. Qty: 30 tablet, Refills: 5   Associated Diagnoses: Gastroesophageal reflux disease without esophagitis    polyethylene glycol powder (GLYCOLAX/MIRALAX) powder Take 1 Container by mouth daily as needed for mild constipation.     sulfamethoxazole-trimethoprim (BACTRIM DS,SEPTRA DS) 800-160 MG per tablet Take 1 tablet by mouth 2 (two) times daily. Qty: 14  tablet, Refills: 7   Associated Diagnoses: Type II diabetes mellitus with manifestations    vitamin C (ASCORBIC ACID) 500 MG tablet Take 500 mg by mouth 2 (two) times daily.    Vitamin D, Ergocalciferol, (DRISDOL) 50000 UNITS CAPS capsule Take 50,000 Units by mouth every Friday.     zinc gluconate 50 MG tablet Take 50 mg by mouth daily.       No Known Allergies Follow-up Information    Follow up with Adella Hare, MD In 2 weeks.   Specialty:  Internal Medicine      Follow up with Adella Hare, MD.   Specialty:  Internal Medicine  The results of significant diagnostics from this hospitalization (including imaging, microbiology, ancillary and laboratory) are listed below for reference.    Significant Diagnostic Studies: Dg Chest 2 View  10/05/2014   CLINICAL DATA:  Two day history of difficulty breathing  EXAM: CHEST  2 VIEW  COMPARISON:  July 06, 2013  FINDINGS: There is interstitial edema with bilateral pleural effusions and cardiomegaly. There is mild pulmonary venous hypertension. No consolidation. Patient is status post coronary artery bypass grafting. No appreciable adenopathy. No bone lesions.  IMPRESSION: Congestive heart failure.   Electronically Signed   By: Lowella Grip M.D.   On: 10/05/2014 13:17    Microbiology: No results found for this or any previous visit (from the past 240 hour(s)).   Labs: Basic Metabolic Panel:  Recent Labs Lab 10/02/14 1326 10/05/14 1144 10/06/14 0412 10/07/14 0305  NA 140 138 137 136  K 4.4 4.8 4.2 4.1  CL 111 111 112 108  CO2 21 18* 16* 21  GLUCOSE 86 87 97 86  BUN 30* 30* 32* 32*  CREATININE 4.41* 5.11* 5.09* 5.49*  CALCIUM 9.9 9.8 9.2 8.9  MG  --  2.0  --   --   PHOS 3.4  --   --   --    Liver Function Tests:  Recent Labs Lab 10/02/14 1326 10/05/14 1144  AST  --  20  ALT  --  <5  ALKPHOS  --  76  BILITOT  --  0.7  PROT  --  7.2  ALBUMIN 3.5 3.6   No results for input(s): LIPASE, AMYLASE in the  last 168 hours. No results for input(s): AMMONIA in the last 168 hours. CBC:  Recent Labs Lab 10/02/14 1316 10/05/14 1144 10/06/14 0412  WBC  --  4.0 3.1*  HGB 7.3* 10.4* 9.5*  HCT  --  32.2* 29.0*  MCV  --  84.7 86.6  PLT  --  123* 81*   Cardiac Enzymes: No results for input(s): CKTOTAL, CKMB, CKMBINDEX, TROPONINI in the last 168 hours. BNP: BNP (last 3 results) No results for input(s): PROBNP in the last 8760 hours. CBG:  Recent Labs Lab 10/06/14 0624 10/06/14 1245 10/06/14 2206 10/07/14 0621 10/07/14 1131  GLUCAP 90 91 88 80 103*       Signed:  Dajai Wahlert  [facility] 10/07/2014, 3:00 PM

## 2014-10-09 ENCOUNTER — Encounter (HOSPITAL_COMMUNITY): Payer: Self-pay | Admitting: Emergency Medicine

## 2014-10-09 ENCOUNTER — Emergency Department (HOSPITAL_COMMUNITY): Payer: Medicare HMO

## 2014-10-09 ENCOUNTER — Inpatient Hospital Stay (HOSPITAL_COMMUNITY)
Admission: EM | Admit: 2014-10-09 | Discharge: 2014-10-11 | DRG: 682 | Disposition: A | Discharge status: Home / Self Care | Payer: Medicare HMO | Attending: Internal Medicine | Admitting: Internal Medicine

## 2014-10-09 DIAGNOSIS — I4891 Unspecified atrial fibrillation: Secondary | ICD-10-CM | POA: Diagnosis present

## 2014-10-09 DIAGNOSIS — E43 Unspecified severe protein-calorie malnutrition: Secondary | ICD-10-CM | POA: Insufficient documentation

## 2014-10-09 DIAGNOSIS — Z7901 Long term (current) use of anticoagulants: Secondary | ICD-10-CM | POA: Diagnosis not present

## 2014-10-09 DIAGNOSIS — Z7982 Long term (current) use of aspirin: Secondary | ICD-10-CM | POA: Diagnosis not present

## 2014-10-09 DIAGNOSIS — G8194 Hemiplegia, unspecified affecting left nondominant side: Secondary | ICD-10-CM | POA: Diagnosis present

## 2014-10-09 DIAGNOSIS — I130 Hypertensive heart and chronic kidney disease with heart failure and stage 1 through stage 4 chronic kidney disease, or unspecified chronic kidney disease: Secondary | ICD-10-CM | POA: Diagnosis present

## 2014-10-09 DIAGNOSIS — D472 Monoclonal gammopathy: Secondary | ICD-10-CM | POA: Diagnosis present

## 2014-10-09 DIAGNOSIS — D638 Anemia in other chronic diseases classified elsewhere: Secondary | ICD-10-CM | POA: Diagnosis present

## 2014-10-09 DIAGNOSIS — K5900 Constipation, unspecified: Secondary | ICD-10-CM | POA: Diagnosis present

## 2014-10-09 DIAGNOSIS — Z6824 Body mass index (BMI) 24.0-24.9, adult: Secondary | ICD-10-CM

## 2014-10-09 DIAGNOSIS — Z951 Presence of aortocoronary bypass graft: Secondary | ICD-10-CM

## 2014-10-09 DIAGNOSIS — I2510 Atherosclerotic heart disease of native coronary artery without angina pectoris: Secondary | ICD-10-CM | POA: Diagnosis present

## 2014-10-09 DIAGNOSIS — Z8673 Personal history of transient ischemic attack (TIA), and cerebral infarction without residual deficits: Secondary | ICD-10-CM

## 2014-10-09 DIAGNOSIS — N179 Acute kidney failure, unspecified: Principal | ICD-10-CM | POA: Diagnosis present

## 2014-10-09 DIAGNOSIS — I10 Essential (primary) hypertension: Secondary | ICD-10-CM | POA: Diagnosis present

## 2014-10-09 DIAGNOSIS — Z794 Long term (current) use of insulin: Secondary | ICD-10-CM

## 2014-10-09 DIAGNOSIS — N2581 Secondary hyperparathyroidism of renal origin: Secondary | ICD-10-CM | POA: Diagnosis present

## 2014-10-09 DIAGNOSIS — D509 Iron deficiency anemia, unspecified: Secondary | ICD-10-CM | POA: Diagnosis present

## 2014-10-09 DIAGNOSIS — I5032 Chronic diastolic (congestive) heart failure: Secondary | ICD-10-CM | POA: Diagnosis present

## 2014-10-09 DIAGNOSIS — I272 Other secondary pulmonary hypertension: Secondary | ICD-10-CM | POA: Diagnosis present

## 2014-10-09 DIAGNOSIS — I5042 Chronic combined systolic (congestive) and diastolic (congestive) heart failure: Secondary | ICD-10-CM | POA: Diagnosis present

## 2014-10-09 DIAGNOSIS — K210 Gastro-esophageal reflux disease with esophagitis: Secondary | ICD-10-CM

## 2014-10-09 DIAGNOSIS — Z87891 Personal history of nicotine dependence: Secondary | ICD-10-CM | POA: Diagnosis not present

## 2014-10-09 DIAGNOSIS — K219 Gastro-esophageal reflux disease without esophagitis: Secondary | ICD-10-CM | POA: Diagnosis present

## 2014-10-09 DIAGNOSIS — E785 Hyperlipidemia, unspecified: Secondary | ICD-10-CM | POA: Diagnosis present

## 2014-10-09 DIAGNOSIS — N185 Chronic kidney disease, stage 5: Secondary | ICD-10-CM | POA: Diagnosis present

## 2014-10-09 DIAGNOSIS — E118 Type 2 diabetes mellitus with unspecified complications: Secondary | ICD-10-CM | POA: Diagnosis present

## 2014-10-09 DIAGNOSIS — N189 Chronic kidney disease, unspecified: Secondary | ICD-10-CM | POA: Diagnosis present

## 2014-10-09 DIAGNOSIS — E119 Type 2 diabetes mellitus without complications: Secondary | ICD-10-CM | POA: Diagnosis present

## 2014-10-09 DIAGNOSIS — I619 Nontraumatic intracerebral hemorrhage, unspecified: Secondary | ICD-10-CM | POA: Diagnosis present

## 2014-10-09 DIAGNOSIS — I2721 Secondary pulmonary arterial hypertension: Secondary | ICD-10-CM | POA: Diagnosis present

## 2014-10-09 LAB — CBC WITH DIFFERENTIAL/PLATELET
Basophils Absolute: 0 10*3/uL (ref 0.0–0.1)
Basophils Relative: 0 % (ref 0–1)
Eosinophils Absolute: 0 10*3/uL (ref 0.0–0.7)
Eosinophils Relative: 1 % (ref 0–5)
HCT: 31.5 % — ABNORMAL LOW (ref 39.0–52.0)
Hemoglobin: 10.3 g/dL — ABNORMAL LOW (ref 13.0–17.0)
Lymphocytes Relative: 23 % (ref 12–46)
Lymphs Abs: 0.8 10*3/uL (ref 0.7–4.0)
MCH: 27.8 pg (ref 26.0–34.0)
MCHC: 32.7 g/dL (ref 30.0–36.0)
MCV: 85.1 fL (ref 78.0–100.0)
Monocytes Absolute: 0.3 10*3/uL (ref 0.1–1.0)
Monocytes Relative: 8 % (ref 3–12)
Neutro Abs: 2.4 10*3/uL (ref 1.7–7.7)
Neutrophils Relative %: 68 % (ref 43–77)
Platelets: 106 10*3/uL — ABNORMAL LOW (ref 150–400)
RBC: 3.7 MIL/uL — ABNORMAL LOW (ref 4.22–5.81)
RDW: 17 % — ABNORMAL HIGH (ref 11.5–15.5)
WBC: 3.5 10*3/uL — ABNORMAL LOW (ref 4.0–10.5)

## 2014-10-09 LAB — COMPREHENSIVE METABOLIC PANEL
ALT: 8 U/L (ref 0–53)
AST: 16 U/L (ref 0–37)
Albumin: 3.3 g/dL — ABNORMAL LOW (ref 3.5–5.2)
Alkaline Phosphatase: 75 U/L (ref 39–117)
Anion gap: 19 — ABNORMAL HIGH (ref 5–15)
BUN: 36 mg/dL — ABNORMAL HIGH (ref 6–23)
CO2: 17 mmol/L — ABNORMAL LOW (ref 19–32)
Calcium: 10.1 mg/dL (ref 8.4–10.5)
Chloride: 103 mEq/L (ref 96–112)
Creatinine, Ser: 6.11 mg/dL — ABNORMAL HIGH (ref 0.50–1.35)
GFR calc Af Amer: 9 mL/min — ABNORMAL LOW (ref >90)
GFR calc non Af Amer: 8 mL/min — ABNORMAL LOW (ref >90)
Glucose, Bld: 111 mg/dL — ABNORMAL HIGH (ref 70–99)
Potassium: 4.3 mmol/L (ref 3.5–5.1)
Sodium: 139 mmol/L (ref 135–145)
Total Bilirubin: 0.6 mg/dL (ref 0.3–1.2)
Total Protein: 6.8 g/dL (ref 6.0–8.3)

## 2014-10-09 LAB — I-STAT TROPONIN, ED: Troponin i, poc: 0.02 ng/mL (ref 0.00–0.08)

## 2014-10-09 LAB — PROTIME-INR
INR: 1.19 (ref 0.00–1.49)
Prothrombin Time: 15.2 seconds (ref 11.6–15.2)

## 2014-10-09 LAB — GLUCOSE, CAPILLARY: Glucose-Capillary: 74 mg/dL (ref 70–99)

## 2014-10-09 MED ORDER — ATORVASTATIN CALCIUM 20 MG PO TABS
20.0000 mg | ORAL_TABLET | Freq: Every day | ORAL | Status: DC
  Administered 2014-10-09 – 2014-10-10 (×2): 20 mg via ORAL
  Filled 2014-10-09 (×3): qty 1

## 2014-10-09 MED ORDER — CARVEDILOL 6.25 MG PO TABS
6.2500 mg | ORAL_TABLET | Freq: Two times a day (BID) | ORAL | Status: DC
  Administered 2014-10-09 – 2014-10-11 (×4): 6.25 mg via ORAL
  Filled 2014-10-09 (×5): qty 1

## 2014-10-09 MED ORDER — HEPARIN SODIUM (PORCINE) 5000 UNIT/ML IJ SOLN
5000.0000 [IU] | Freq: Three times a day (TID) | INTRAMUSCULAR | Status: DC
  Administered 2014-10-09 – 2014-10-11 (×6): 5000 [IU] via SUBCUTANEOUS
  Filled 2014-10-09 (×8): qty 1

## 2014-10-09 MED ORDER — VITAMIN C 500 MG PO TABS
500.0000 mg | ORAL_TABLET | Freq: Two times a day (BID) | ORAL | Status: DC
  Administered 2014-10-09 – 2014-10-11 (×4): 500 mg via ORAL
  Filled 2014-10-09 (×5): qty 1

## 2014-10-09 MED ORDER — SODIUM CHLORIDE 0.9 % IV SOLN
INTRAVENOUS | Status: DC
  Administered 2014-10-09: 23:00:00 via INTRAVENOUS

## 2014-10-09 MED ORDER — POLYETHYLENE GLYCOL 3350 17 G PO PACK
17.0000 g | PACK | Freq: Every day | ORAL | Status: DC
Start: 2014-10-10 — End: 2014-10-11
  Administered 2014-10-10: 17 g via ORAL
  Filled 2014-10-09 (×2): qty 1

## 2014-10-09 MED ORDER — INSULIN ASPART 100 UNIT/ML ~~LOC~~ SOLN
0.0000 [IU] | Freq: Three times a day (TID) | SUBCUTANEOUS | Status: DC
  Administered 2014-10-10: 1 [IU] via SUBCUTANEOUS

## 2014-10-09 MED ORDER — ZINC SULFATE 220 (50 ZN) MG PO CAPS
220.0000 mg | ORAL_CAPSULE | Freq: Every day | ORAL | Status: DC
  Administered 2014-10-10 – 2014-10-11 (×2): 220 mg via ORAL
  Filled 2014-10-09 (×2): qty 1

## 2014-10-09 MED ORDER — ZINC GLUCONATE 50 MG PO TABS: 50.0000 mg | ORAL_TABLET | Freq: Every day | ORAL | Status: DC

## 2014-10-09 MED ORDER — AMLODIPINE BESYLATE 10 MG PO TABS
10.0000 mg | ORAL_TABLET | Freq: Every day | ORAL | Status: DC
  Administered 2014-10-10 – 2014-10-11 (×2): 10 mg via ORAL
  Filled 2014-10-09 (×2): qty 1

## 2014-10-09 MED ORDER — SODIUM CHLORIDE 0.9 % IV BOLUS (SEPSIS)
500.0000 mL | Freq: Once | INTRAVENOUS | Status: AC
  Administered 2014-10-09: 500 mL via INTRAVENOUS

## 2014-10-09 MED ORDER — MINERAL OIL RE ENEM
1.0000 | ENEMA | Freq: Once | RECTAL | Status: DC
  Filled 2014-10-09: qty 1

## 2014-10-09 MED ORDER — POLYETHYLENE GLYCOL 3350 17 GM/SCOOP PO POWD
1.0000 | Freq: Every day | ORAL | Status: DC
  Filled 2014-10-09: qty 255

## 2014-10-09 MED ORDER — ACETAMINOPHEN 650 MG RE SUPP: 650.0000 mg | Freq: Four times a day (QID) | RECTAL | Status: DC | PRN

## 2014-10-09 MED ORDER — DOCUSATE SODIUM 100 MG PO CAPS
100.0000 mg | ORAL_CAPSULE | Freq: Two times a day (BID) | ORAL | Status: DC
  Administered 2014-10-09 – 2014-10-11 (×4): 100 mg via ORAL
  Filled 2014-10-09 (×5): qty 1

## 2014-10-09 MED ORDER — ACETAMINOPHEN 325 MG PO TABS: 650.0000 mg | ORAL_TABLET | Freq: Four times a day (QID) | ORAL | Status: DC | PRN

## 2014-10-09 MED ORDER — PANTOPRAZOLE SODIUM 40 MG PO TBEC
40.0000 mg | DELAYED_RELEASE_TABLET | Freq: Every day | ORAL | Status: DC
  Administered 2014-10-10 – 2014-10-11 (×2): 40 mg via ORAL
  Filled 2014-10-09 (×2): qty 1

## 2014-10-09 MED ORDER — FERROUS SULFATE 325 (65 FE) MG PO TABS
325.0000 mg | ORAL_TABLET | Freq: Every day | ORAL | Status: DC
  Administered 2014-10-10 – 2014-10-11 (×2): 325 mg via ORAL
  Filled 2014-10-09 (×4): qty 1

## 2014-10-09 MED ORDER — ASPIRIN 81 MG PO CHEW
81.0000 mg | CHEWABLE_TABLET | Freq: Every day | ORAL | Status: DC
  Administered 2014-10-10 – 2014-10-11 (×2): 81 mg via ORAL
  Filled 2014-10-09 (×2): qty 1

## 2014-10-09 NOTE — ED Notes (Signed)
PA at the bedside.

## 2014-10-09 NOTE — H&P (Signed)
[facility] History and Physical  [name] FKC:[id number] DOB: 1931/02/19 DOA: 10/09/2014  Referring physician: ED physician PCP: Adella Hare, MD  Specialists:   Chief Complaint: Nausea, generalized weakness, severe constipation.  HPI: [name] is a 81 y.o. male with hx of CAD, dCHF, chronic anemia, CKD-V, CAD (s/p of CABG), hypertension, hyperlipidemia, diabetes mellitus, GERD, MGUS, atrial fibrillation, who presents with Nausea, generalized weakness, severe constipation.  Patient is accompanied by his son. History is obtained from patient and his son. patient report that he has severe constipation, and has not had bowel movement for 10 days.  Pt feels more fatigued and c/o nausea whenever he eats.  Patient does not have abdominal pain or vomiting. No fever or chills. He does not have symptoms of UTI. Patient has left sided paralysis from previous hemorrhagic stroke. Patient denies fever, chills, cough, chest pain, SOB, abdominal pain, dysuria, urgency, frequency, hematuria, skin rashes, joint pain or leg swelling. Of note, patient is on iron supplements at home.  Work up in the ED demonstrates worsening renal function, negative troponin. Patient is admitted to the inpatient for further evaluation and treatment.   Review of Systems: As presented in the history of presenting illness, rest negative.  Where does patient live?  At home with his son Can patient participate in ADLs? none  Allergy: No Known Allergies  Past Medical History  Diagnosis Date  . CAD (coronary artery disease)     s/p 3v CABG 2006, myoview 04/2010 EF 49%, prior inferior/apical infarct, no ischemia, LHC 11/2011 stable anatomy (occluded LAD filled from vein graft, distal LCx occluded prior to OM2, OM2 filled from vein graft, RCA prox 25%, mid 40%, distal 25% lesions, SVG to diagonal occluded) Med Rx  . Chronic diastolic heart failure     hx of cardiorenal syndrome;  Echo 06/2012 EF 74-94%, mod  diastolic dysfunction, mild MR, mod TR, mild R/LAE, mild RV dilatation, PASP 35mHg.  . Carotid stenosis     a. Carotid dopplers R 40-59%, Left 60-79%;   b. carotids 12/13:  04-96%RICA, 675-91%LICA (rpt in 6 mos);  c.  Carotid [place](11/14):  R 40-59%; L 60-79%, R vertebral occluded, L vertebral antegrade  . HLD (hyperlipidemia)   . Mediastinal adenopathy     per CT chest in 2006 with PET scan showing very limited metabolic activity; not felt to have a significant neoplastic potential  . Iron deficiency anemia   . Hypertension   . Diabetes mellitus     insulin dependent   . GERD (gastroesophageal reflux disease)   . AV block, Mobitz 1     Noted 11/2011 in hospital, BB stopped  . Colon polyps   . Diverticulosis   . Pulmonary arterial hypertension     a.  RHC 11/1 PASP 60 mmHg (mean 38); b. RHC 11/15 PA pressure 66/24, PCWP 26 and CO 6.2 => Sildenafil started;   c. Echo bubble study 11/13:  no obvious shunt;   d. VQ scan neg for pulmonary embolism  . CKD (chronic kidney disease) stage 5, GFR less than 15 ml/min     hx of cardiorenal syndrome;  Dr PPosey Pronto  AVF recommended and scheduled (patient hesitant to proceed);   Renal [place]11[place]13: diff echogenic kidneys c/w medical renal disease; no hydronephrosis or renal mass  . Hyperparathyroidism, secondary renal   . MGUS (monoclonal gammopathy of unknown significance)     a. Kappa/Lambda free light chain ratio 11/13:  23.99;  b. Met. Bone survey 11/13:  no osteolytic lesions to suggest MMyeloma  . Atrial flutter     diagnosed 05/2013; Coumadin initiated    Past Surgical History  Procedure Laterality Date  . Appendectomy    . Coronary artery bypass graft  2006    SVG to OM2, SVG to LAD, SVG to DX; (the LIMA did not have good flow and therefore was not used)  . Cardiac catheterization  12/21/2011  . Left heart catheterization with coronary/graft angiogram N/A 12/21/2011    Procedure: LEFT HEART CATHETERIZATION WITH Beatrix Fetters;  Surgeon:  Burnell Blanks, MD;  Location: [facility] CATH LAB;  Service: Cardiovascular;  Laterality: N/A;  . Right heart catheterization Bilateral 07/28/2012    Procedure: RIGHT HEART CATH;  Surgeon: Larey Dresser, MD;  Location: [facility] CATH LAB;  Service: Cardiovascular;  Laterality: Bilateral;  . Right heart catheterization N/A 08/11/2012    Procedure: RIGHT HEART CATH;  Surgeon: Thayer Headings, MD;  Location: [facility] C[facility] LAB;  Service: Cardiovascular;  Laterality: N/A;    Social History:  reports that he quit smoking about 61 years ago. He has never used smokeless tobacco. He reports that he does not drink alcohol or use illicit drugs.  Family History:  Family History  Problem Relation Age of Onset  . Stomach cancer Sister   . Pancreatic cancer Brother   . Lung cancer Brother   . Colon cancer Neg Hx      Prior to Admission medications   Medication Sig Start Date End Date Taking? Authorizing Provider  amLODipine (NORVASC) 10 MG tablet Take 10 mg by mouth daily.   Yes Historical Provider, MD  aspirin 81 MG chewable tablet Chew 1 tablet (81 mg total) by mouth daily. 10/07/14  Yes Kelvin Cellar, MD  atorvastatin (LIPITOR) 20 MG tablet take 1 tablet by mouth at bedtime 09/14/13  Yes Lelon Perla, MD  carvedilol (COREG) 6.25 MG tablet Take 1 tablet (6.25 mg total) by mouth 2 (two) times daily. 09/05/14  Yes Mauricio Po, FNP  docusate sodium 100 MG CAPS Take 100 mg by mouth 2 (two) times daily. 10/07/14  Yes Kelvin Cellar, MD  ferrous sulfate 325 (65 FE) MG tablet Take 1 tablet (325 mg total) by mouth daily with breakfast. 10/07/14  Yes Kelvin Cellar, MD  linagliptin (TRADJENTA) 5 MG TABS tablet Take 1 tablet (5 mg total) by mouth daily. 09/30/14  Yes Mauricio Po, FNP  pantoprazole (PROTONIX) 40 MG tablet Take 1 tablet (40 mg total) by mouth daily. 09/30/14  Yes Mauricio Po, FNP  polyethylene glycol powder (GLYCOLAX/MIRALAX) powder Take 1 Container by mouth daily as needed for mild constipation.     Yes Historical Provider, MD  sulfamethoxazole-trimethoprim (BACTRIM DS,SEPTRA DS) 800-160 MG per tablet Take 1 tablet by mouth 2 (two) times daily. 09/30/14  Yes Mauricio Po, FNP  vitamin C (ASCORBIC ACID) 500 MG tablet Take 500 mg by mouth 2 (two) times daily.   Yes Historical Provider, MD  Vitamin D, Ergocalciferol, (DRISDOL) 50000 UNITS CAPS capsule Take 50,000 Units by mouth every Friday.    Yes Historical Provider, MD  zinc gluconate 50 MG tablet Take 50 mg by mouth daily.   Yes Historical Provider, MD    Physical Exam: Filed Vitals:   10/09/14 1800 10/09/14 1830 10/09/14 1845 10/09/14 1900  BP: 154/70 147/73 150/70 140/66  Pulse: 56 63 66 62  Temp:      TempSrc:      Resp:  _0 SpO2: 96% 96% 94% 95%  General: Not in acute distress HEENT:       Eyes: PERRL, EOMI, no scleral icterus       ENT: No discharge from the ears and nose, no pharynx injection, no tonsillar enlargement.        Neck: No JVD, no bruit, no mass felt. Cardiac: S1/S2, RRR, No murmurs, No gallops or rubs Pulm: Good air movement bilaterally. Clear to auscultation bilaterally. No rales, wheezing, rhonchi or rubs. Abd: Soft, nondistended, nontender, no rebound pain, no organomegaly, BS present Ext: No edema bilaterally. 2+DP/PT pulse. Musculoskeletal: No joint deformities, erythema, or stiffness, ROM full Skin: No rashes.  Neuro: Alert and oriented X3, cranial nerves II-XII grossly intact,  left-sided paralysis.  Psych: Patient is not psychotic, no suicidal or hemocidal ideation.  Labs on Admission:  Basic Metabolic Panel:  Recent Labs Lab 10/05/14 1144 10/06/14 0412 10/07/14 0305 10/09/14 1739  NA 138 137 136 139  K 4.8 4.2 4.1 4.3  CL 111 112 108 103  CO2 18* 16* 21 17*  GLUCOSE 87 97 86 111*  BUN 30* 32* 32* 36*  CREATININE 5.11* 5.09* 5.49* 6.11*  CALCIUM 9.8 9.2 8.9 10.1  MG 2.0  --   --   --    Liver Function Tests:  Recent Labs Lab 10/05/14 1144 10/09/14 1739  AST 20 16  ALT  <5 8  ALKPHOS 76 75  BILITOT 0.7 0.6  PROT 7.2 6.8  ALBUMIN 3.6 3.3*   No results for input(s): LIPASE, AMYLASE in the last 168 hours. No results for input(s): AMMONIA in the last 168 hours. CBC:  Recent Labs Lab 10/05/14 1144 10/06/14 0412 10/09/14 1739  WBC 4.0 3.1* 3.5*  NEUTROABS  --   --  2.4  HGB 10.4* 9.5* 10.3*  HCT 32.2* 29.0* 31.5*  MCV 84.7 86.6 85.1  PLT 123* 81* 106*   Cardiac Enzymes: No results for input(s): CKTOTAL, CKMB, CKMBINDEX, TROPONINI in the last 168 hours.  BNP (last 3 results) No results for input(s): PROBNP in the last 8760 hours. CBG:  Recent Labs Lab 10/06/14 0624 10/06/14 1245 10/06/14 2206 10/07/14 0621 10/07/14 1131  GLUCAP 90 91 88 80 103*    Radiological Exams on Admission: Dg Abd Acute W/chest  10/09/2014   CLINICAL DATA:  Loss of appetite, constipation for 10 days, history hypertension, diabetes, chronic kidneys disease, diverticulosis, prior open-heart surgery, coronary artery disease, pulmonary arterial hypertension, monoclonal gammopathy of unknown significance  EXAM: ACUTE ABDOMEN SERIES (ABDOMEN 2 VIEW & CHEST 1 VIEW)  COMPARISON:  Chest radiograph 10/05/2014 abdominal radiograph 07/24/2008  FINDINGS: Enlargement of cardiac silhouette post CABG.  Stable mediastinal contours.  Bibasilar effusions and atelectasis.  Pulmonary edema slightly improved.  No pneumothorax.  Scattered stool throughout colon including rectum.  Nonobstructive bowel gas pattern.  No bowel dilatation, bowel wall thickening, or free intraperitoneal air.  Bones demineralized.  No urinary tract calcification.  IMPRESSION: Slightly improved CHF with persistent bibasilar effusions and atelectasis.  Normal bowel gas pattern.   Electronically Signed   By: Lavonia Dana M.D.   On: 10/09/2014 15:55    EKG: Independently reviewed.   Assessment/Plan Principal Problem:   Acute on chronic kidney failure Active Problems:   Type II diabetes mellitus with manifestations    Hyperlipidemia   Essential hypertension   Coronary atherosclerosis   Anemia, iron deficiency   Pulmonary arterial hypertension   CKD (chronic kidney disease) stage 5, GFR less than 15 ml/min   Atrial fibrillation   Chronic diastolic congestive heart failure  Stroke due to intracerebral hemorrhage   GERD (gastroesophageal reflux disease)   Acute on chronic renal failure   Constipation  AoCKD-V: It is likely due to decreased oral intake or prerenal failure.  -admit to med-surg be -Checker FeNa and UA -US-renal -gentle IVF: 50 cc/h given hx of CHF.  Severe constipation: Likely secondary to iron supplements use. - MiraLAX and Colace -If not effective, may do enema  Type II diabetes mellitus with manifestations: on Linagliptan at home -switch to sliding scale   sCHF: 2d ehco on 10/07/14 showed EF of 40%. Patient is euvolemic on admission. -check BNP - cont B Blocker - avoid ACE I due to renal failure  HTN - Continue amlodipine and Coreg  H/o hemorrhagic CVA: - bedbound - on statin, Lipitor  A-fib: Used to be on Coumadin, which was discontinued after he developed intracranial hemorrhage on 05/2014. Heart rate is well controlled. -Continue Coreg -he may need to restart Coumadin as it has been a few months since hemostatic stroke. May need to discuss with cards and neurology -continue ASA  DVT ppx: SQ Heparin       Code Status: Full code Family Communication:  Yes, patient's  son     at bed side Disposition Plan: Admit to inpatient   Date of Service 10/09/2014    Ivor Costa [facility] Pager [phone]  If 7PM-7AM, please contact night-coverage [email or web address] Password [facility] 10/09/2014, 8:08 PM

## 2014-10-09 NOTE — ED Notes (Signed)
Admittinng MD at the bedside.

## 2014-10-09 NOTE — ED Notes (Signed)
Attempted IV x1. Unable to obtain. TIffany, RN at the bedside attempting.

## 2014-10-09 NOTE — ED Provider Notes (Signed)
CSN: [id number]     Arrival date & time 10/09/14  1349 History   First MD Initiated Contact with Patient 10/09/14 1603     Chief Complaint  Patient presents with  . Constipation     (Consider location/radiation/quality/duration/timing/severity/associated sxs/prior Treatment) HPI  Pt is an 81yo male with hx of CAD, CHF, chronic anemia, CKD, recently admitted for acute on chronic renal failure admitted on 10/05/14 discharged 10/07/14.  Pt was started on IV Lasix during admission, diuresing 1.1 L over his first night.  Pt believed to have become fluid overloaded after recent blood transfusion. Pt presented at that time with c/o SOB. Today, son has accompanied pt and provided most of the hx.  Son states pt has not had a BM in about 10 days. Pt appears more fatigued and c/o nausea whenever he eats.  Son states pt was suppose to have a BM prior to discharge but states "he never did."  Pt denies chest pain, abdominal pain, or vomiting. Denies difficulty urinating.  Denies fever or chills.  Pt has had a decreased appetite.  Son states he did call his PCP today who advised him to bring pt to ED for further evaluation of reported weakness and constipation.  No hx of abdominal surgeries.   PCP: Dr. Linda Hedges, [place]  Past Medical History  Diagnosis Date  . CAD (coronary artery disease)     s/p 3v CABG 2006, myoview 04/2010 EF 49%, prior inferior/apical infarct, no ischemia, LHC 11/2011 stable anatomy (occluded LAD filled from vein graft, distal LCx occluded prior to OM2, OM2 filled from vein graft, RCA prox 25%, mid 40%, distal 25% lesions, SVG to diagonal occluded) Med Rx  . Chronic diastolic heart failure     hx of cardiorenal syndrome;  Echo 06/2012 EF 88-11%, mod diastolic dysfunction, mild MR, mod TR, mild R/LAE, mild RV dilatation, PASP 27mmHg.  . Carotid stenosis     a. Carotid dopplers R 40-59%, Left 60-79%;   b. carotids 12/13:  0-31% RICA, 59-45% LICA (rpt in 6 mos);  c.  Carotid US (11/14):   R 40-59%; L 60-79%, R vertebral occluded, L vertebral antegrade  . HLD (hyperlipidemia)   . Mediastinal adenopathy     per CT chest in 2006 with PET scan showing very limited metabolic activity; not felt to have a significant neoplastic potential  . Iron deficiency anemia   . Hypertension   . Diabetes mellitus     insulin dependent   . GERD (gastroesophageal reflux disease)   . AV block, Mobitz 1     Noted 11/2011 in hospital, BB stopped  . Colon polyps   . Diverticulosis   . Pulmonary arterial hypertension     a.  RHC 11/1 PASP 60 mmHg (mean 38); b. RHC 11/15 PA pressure 66/24, PCWP 26 and CO 6.2 => Sildenafil started;   c. Echo bubble study 11/13:  no obvious shunt;   d. VQ scan neg for pulmonary embolism  . CKD (chronic kidney disease) stage 5, GFR less than 15 ml/min     hx of cardiorenal syndrome;  Dr Posey Pronto;  AVF recommended and scheduled (patient hesitant to proceed);   Renal US 11/13: diff echogenic kidneys c/w medical renal disease; no hydronephrosis or renal mass  . Hyperparathyroidism, secondary renal   . MGUS (monoclonal gammopathy of unknown significance)     a. Kappa/Lambda free light chain ratio 11/13:  23.99;  b. Met. Bone survey 11/13:  no osteolytic lesions to suggest MMyeloma  . Atrial  flutter     diagnosed 05/2013; Coumadin initiated   Past Surgical History  Procedure Laterality Date  . Appendectomy    . Coronary artery bypass graft  2006    SVG to OM2, SVG to LAD, SVG to DX; (the LIMA did not have good flow and therefore was not used)  . Cardiac catheterization  12/21/2011  . Left heart catheterization with coronary/graft angiogram N/A 12/21/2011    Procedure: LEFT HEART CATHETERIZATION WITH Beatrix Fetters;  Surgeon: Burnell Blanks, MD;  Location: [facility] CATH LAB;  Service: Cardiovascular;  Laterality: N/A;  . Right heart catheterization Bilateral 07/28/2012    Procedure: RIGHT HEART CATH;  Surgeon: Larey Dresser, MD;  Location: [facility] CATH LAB;  Service:  Cardiovascular;  Laterality: Bilateral;  . Right heart catheterization N/A 08/11/2012    Procedure: RIGHT HEART CATH;  Surgeon: Thayer Headings, MD;  Location: [facility] CATH LAB;  Service: Cardiovascular;  Laterality: N/A;   Family History  Problem Relation Age of Onset  . Stomach cancer Sister   . Pancreatic cancer Brother   . Lung cancer Brother   . Colon cancer Neg Hx    History  Substance Use Topics  . Smoking status: Former Smoker    Quit date: 09/27/1953  . Smokeless tobacco: Never Used  . Alcohol Use: No    Review of Systems  Constitutional: Positive for activity change and fatigue. Negative for fever, chills, diaphoresis and unexpected weight change.  Respiratory: Negative for shortness of breath.   Cardiovascular: Negative for chest pain.  Gastrointestinal: Positive for nausea and constipation. Negative for vomiting, abdominal pain, diarrhea and blood in stool.  Neurological: Positive for weakness ( generalized).  All other systems reviewed and are negative.     Allergies  Review of patient's allergies indicates no known allergies.  Home Medications   Prior to Admission medications   Medication Sig Start Date End Date Taking? Authorizing Provider  amLODipine (NORVASC) 10 MG tablet Take 10 mg by mouth daily.   Yes Historical Provider, MD  aspirin 81 MG chewable tablet Chew 1 tablet (81 mg total) by mouth daily. 10/07/14  Yes Kelvin Cellar, MD  atorvastatin (LIPITOR) 20 MG tablet take 1 tablet by mouth at bedtime 09/14/13  Yes Lelon Perla, MD  carvedilol (COREG) 6.25 MG tablet Take 1 tablet (6.25 mg total) by mouth 2 (two) times daily. 09/05/14  Yes Mauricio Po, FNP  docusate sodium 100 MG CAPS Take 100 mg by mouth 2 (two) times daily. 10/07/14  Yes Kelvin Cellar, MD  ferrous sulfate 325 (65 FE) MG tablet Take 1 tablet (325 mg total) by mouth daily with breakfast. 10/07/14  Yes Kelvin Cellar, MD  linagliptin (TRADJENTA) 5 MG TABS tablet Take 1 tablet (5 mg  total) by mouth daily. 09/30/14  Yes Mauricio Po, FNP  pantoprazole (PROTONIX) 40 MG tablet Take 1 tablet (40 mg total) by mouth daily. 09/30/14  Yes Mauricio Po, FNP  polyethylene glycol powder (GLYCOLAX/MIRALAX) powder Take 1 Container by mouth daily as needed for mild constipation.    Yes Historical Provider, MD  sulfamethoxazole-trimethoprim (BACTRIM DS,SEPTRA DS) 800-160 MG per tablet Take 1 tablet by mouth 2 (two) times daily. 09/30/14  Yes Mauricio Po, FNP  vitamin C (ASCORBIC ACID) 500 MG tablet Take 500 mg by mouth 2 (two) times daily.   Yes Historical Provider, MD  Vitamin D, Ergocalciferol, (DRISDOL) 50000 UNITS CAPS capsule Take 50,000 Units by mouth every Friday.    Yes Historical Provider, MD  zinc gluconate 50  MG tablet Take 50 mg by mouth daily.   Yes Historical Provider, MD   BP 142/80 mmHg  Pulse 64  Temp(Src) 98.5 F (36.9 C) (Oral)  Resp 18  Ht $R'5\' 11"'HP$  (1.803 m)  Wt 175 lb 12.8 oz (79.742 kg)  BMI 24.53 kg/m2  SpO2 95% Physical Exam  Constitutional: He appears well-developed and well-nourished.  Elderly male sitting on side of exam bed, NAD  HENT:  Head: Normocephalic and atraumatic.  Eyes: Conjunctivae are normal. No scleral icterus.  Neck: Normal range of motion.  Cardiovascular: Normal rate, regular rhythm and normal heart sounds.   Pulmonary/Chest: Effort normal and breath sounds normal. No respiratory distress. He has no wheezes. He has no rales. He exhibits no tenderness.  Abdominal: Soft. Bowel sounds are normal. He exhibits no distension and no mass. There is no tenderness. There is no rebound and no guarding.  Soft, non-distended, non-tender  Genitourinary:  Chaperoned exam. Rectal exam: normal rectal tone, soft brown stool. No fecal impaction.  Musculoskeletal: Normal range of motion.  Neurological: He is alert.  Skin: Skin is warm and dry.  Nursing note and vitals reviewed.   ED Course  Procedures (including critical care time) Labs Review Labs  Reviewed  CBC WITH DIFFERENTIAL - Abnormal; Notable for the following:    WBC 3.5 (*)    RBC 3.70 (*)    Hemoglobin 10.3 (*)    HCT 31.5 (*)    RDW 17.0 (*)    Platelets 106 (*)    All other components within normal limits  COMPREHENSIVE METABOLIC PANEL - Abnormal; Notable for the following:    CO2 17 (*)    Glucose, Bld 111 (*)    BUN 36 (*)    Creatinine, Ser 6.11 (*)    Albumin 3.3 (*)    GFR calc non Af Amer 8 (*)    GFR calc Af Amer 9 (*)    Anion gap 19 (*)    All other components within normal limits  GLUCOSE, CAPILLARY  SODIUM, URINE, RANDOM  CREATININE, URINE, RANDOM  URINALYSIS, ROUTINE W REFLEX MICROSCOPIC  BRAIN NATRIURETIC PEPTIDE  BASIC METABOLIC PANEL  CBC  PROTIME-INR  I-STAT TROPOININ, ED    Imaging Review Dg Abd Acute W/chest  10/09/2014   CLINICAL DATA:  Loss of appetite, constipation for 10 days, history hypertension, diabetes, chronic kidneys disease, diverticulosis, prior open-heart surgery, coronary artery disease, pulmonary arterial hypertension, monoclonal gammopathy of unknown significance  EXAM: ACUTE ABDOMEN SERIES (ABDOMEN 2 VIEW & CHEST 1 VIEW)  COMPARISON:  Chest radiograph 10/05/2014 abdominal radiograph 07/24/2008  FINDINGS: Enlargement of cardiac silhouette post CABG.  Stable mediastinal contours.  Bibasilar effusions and atelectasis.  Pulmonary edema slightly improved.  No pneumothorax.  Scattered stool throughout colon including rectum.  Nonobstructive bowel gas pattern.  No bowel dilatation, bowel wall thickening, or free intraperitoneal air.  Bones demineralized.  No urinary tract calcification.  IMPRESSION: Slightly improved CHF with persistent bibasilar effusions and atelectasis.  Normal bowel gas pattern.   Electronically Signed   By: Lavonia Dana M.D.   On: 10/09/2014 15:55     EKG Interpretation None      MDM   Final diagnoses:  Constipation  AKI (acute kidney injury)   Pt brought to ED by son with concern for gradually worsening  generalized weakness as well as reports constipation. States pt has not had a BM in 10 days but also reports pt is not eating or drinking very much. Pt denies abdominal pain. Abd:  soft, non-distended, non-tender on exam.  Cr: elevated to 6.11, up from 5.49 two days ago Bladder scan: 385mL   7:42 PM Pt has not voided since being in ED, states he will try to void.  On reexam of abdomen, increased firmness on right side of abdomen, denies tenderness.    Pt was able to void 19mL urine.   Discussed pt with Dr. Jeanell Sparrow, concern for prerenal cause of acute on chronic renal failure. Will consult with [facility] to admit pt (PCP: Dr. Linda Hedges with [facility]).  IV fluids started 533mL.    8:04 PM Consulted with Dr. Blaine Hamper, who agreed to admit pt. Requested temporary orders be placed for a tele bed. Pt is hemodynamically stable at this time.     Noland Fordyce, PA-C 10/09/14 1594  Shaune Pollack, MD 10/10/14 [phone]

## 2014-10-09 NOTE — ED Notes (Signed)
Pt here for constipation with no BM x 10 days; pt sts was recently hospitalized

## 2014-10-09 NOTE — Progress Notes (Signed)
Was informed by pager that patient's son Mr. Sujay Grundman Junior wished to speak with M.D. Called patient's son who informed me that patient was discharged on 10/07/14. Even while he was hospitalized, he apparently had poor appetite and had not had a BM for almost a week PTA. Since discharge, patient continues to have poor appetite, nausea without vomiting, no reported abdominal pain or distention. Son is unable to tell regarding flatus. Patient apparently has been sleeping a lot. Upon reviewing discharge notes, patient was hospitalized for acute pulmonary edema related to blood transfusion and was diuresed with IV Lasix. His creatinine has progressively worsened from low 3 range to 5.49 on 1/11. Advised son to bring him to the emergency department for further evaluation. Advised him that his presentation may be related to worsening renal function and possible uremia. Son verbalized understanding.  Vernell Leep, MD, FACP, FHM. [facility] Pager [phone]  If 7PM-7AM, please contact night-coverage [email or web address] Password [email or web address] 10/09/2014, 1:00 PM

## 2014-10-10 ENCOUNTER — Inpatient Hospital Stay (HOSPITAL_COMMUNITY): Payer: Medicare HMO

## 2014-10-10 DIAGNOSIS — N189 Chronic kidney disease, unspecified: Secondary | ICD-10-CM

## 2014-10-10 DIAGNOSIS — K5900 Constipation, unspecified: Secondary | ICD-10-CM

## 2014-10-10 DIAGNOSIS — E118 Type 2 diabetes mellitus with unspecified complications: Secondary | ICD-10-CM

## 2014-10-10 DIAGNOSIS — I10 Essential (primary) hypertension: Secondary | ICD-10-CM

## 2014-10-10 DIAGNOSIS — I482 Chronic atrial fibrillation: Secondary | ICD-10-CM

## 2014-10-10 DIAGNOSIS — N179 Acute kidney failure, unspecified: Principal | ICD-10-CM

## 2014-10-10 DIAGNOSIS — E785 Hyperlipidemia, unspecified: Secondary | ICD-10-CM

## 2014-10-10 DIAGNOSIS — I619 Nontraumatic intracerebral hemorrhage, unspecified: Secondary | ICD-10-CM

## 2014-10-10 LAB — BASIC METABOLIC PANEL
ANION GAP: 16 — AB (ref 5–15)
BUN: 34 mg/dL — ABNORMAL HIGH (ref 6–23)
CHLORIDE: 106 meq/L (ref 96–112)
CO2: 19 mmol/L (ref 19–32)
Calcium: 9.7 mg/dL (ref 8.4–10.5)
Creatinine, Ser: 5.93 mg/dL — ABNORMAL HIGH (ref 0.50–1.35)
GFR calc non Af Amer: 8 mL/min — ABNORMAL LOW (ref >90)
GFR, EST AFRICAN AMERICAN: 9 mL/min — AB (ref >90)
GLUCOSE: 63 mg/dL — AB (ref 70–99)
POTASSIUM: 4.2 mmol/L (ref 3.5–5.1)
Sodium: 141 mmol/L (ref 135–145)

## 2014-10-10 LAB — URINE MICROSCOPIC-ADD ON

## 2014-10-10 LAB — SODIUM, URINE, RANDOM: Sodium, Ur: 68 mmol/L

## 2014-10-10 LAB — URINALYSIS, ROUTINE W REFLEX MICROSCOPIC
BILIRUBIN URINE: NEGATIVE
Glucose, UA: NEGATIVE mg/dL
HGB URINE DIPSTICK: NEGATIVE
KETONES UR: NEGATIVE mg/dL
LEUKOCYTES UA: NEGATIVE
NITRITE: NEGATIVE
PH: 5 (ref 5.0–8.0)
PROTEIN: 30 mg/dL — AB
Specific Gravity, Urine: 1.015 (ref 1.005–1.030)
Urobilinogen, UA: 0.2 mg/dL (ref 0.0–1.0)

## 2014-10-10 LAB — CBC
HCT: 27.6 % — ABNORMAL LOW (ref 39.0–52.0)
Hemoglobin: 9.1 g/dL — ABNORMAL LOW (ref 13.0–17.0)
MCH: 27.9 pg (ref 26.0–34.0)
MCHC: 33 g/dL (ref 30.0–36.0)
MCV: 84.7 fL (ref 78.0–100.0)
PLATELETS: 88 10*3/uL — AB (ref 150–400)
RBC: 3.26 MIL/uL — ABNORMAL LOW (ref 4.22–5.81)
RDW: 17 % — AB (ref 11.5–15.5)
WBC: 3 10*3/uL — AB (ref 4.0–10.5)

## 2014-10-10 LAB — BRAIN NATRIURETIC PEPTIDE: B NATRIURETIC PEPTIDE 5: 676.8 pg/mL — AB (ref 0.0–100.0)

## 2014-10-10 LAB — GLUCOSE, CAPILLARY
Glucose-Capillary: 61 mg/dL — ABNORMAL LOW (ref 70–99)
Glucose-Capillary: 81 mg/dL (ref 70–99)
Glucose-Capillary: 100 mg/dL — ABNORMAL HIGH (ref 70–99)
Glucose-Capillary: 137 mg/dL — ABNORMAL HIGH (ref 70–99)
Glucose-Capillary: 103 mg/dL — ABNORMAL HIGH (ref 70–99)

## 2014-10-10 LAB — CREATININE, URINE, RANDOM: CREATININE, URINE: 104.73 mg/dL

## 2014-10-10 MED ORDER — GLUCERNA SHAKE PO LIQD
237.0000 mL | Freq: Two times a day (BID) | ORAL | Status: DC
  Administered 2014-10-10 – 2014-10-11 (×3): 237 mL via ORAL

## 2014-10-10 MED ORDER — LACTULOSE 10 GM/15ML PO SOLN
10.0000 g | Freq: Once | ORAL | Status: AC
  Administered 2014-10-10: 10 g via ORAL
  Filled 2014-10-10: qty 15

## 2014-10-10 NOTE — Evaluation (Signed)
Physical Therapy Evaluation Patient Details Name: [name] MRN: [id number] DOB: Feb 01, 1931 Today's Date: 10/10/2014   History of Present Illness  Pt adm with acute pulmonary edema. 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   Clinical Impression  Pt admitted with above diagnosis. Pt currently with functional limitations due to the deficits listed below (see PT Problem List). At the time of PT eval pt was able to perform transfers with max assist +2. Pt reports that at home, pt's son "does everything" for him, and he is non-ambulatory at baseline.   Pt will benefit from skilled PT to increase their independence and safety with mobility to allow discharge to the venue listed below. Pt reports that he will be returning home with son. Son not present to confirm, however if pt is not able to return home with son, will need 24 hour care at the SNF level.      Follow Up Recommendations Home health PT;Supervision/Assistance - 24 hour    Equipment Recommendations  Other (comment) (Hoyer lift, drop arm BSC)    Recommendations for Other Services       Precautions / Restrictions Precautions Precautions: Fall Restrictions Weight Bearing Restrictions: No      Mobility  Bed Mobility Overal bed mobility: Needs Assistance;+2 for physical assistance Bed Mobility: Supine to Sit     Supine to sit: Max assist;+2 for physical assistance     General bed mobility comments: Assist for LE movement towards EOB. Pt able to grab bed rail with RUE however unsure how much he was able to assist with that arm. Bed pad used to complete scooting.   Transfers Overall transfer level: Needs assistance Equipment used: 2 person hand held assist Transfers: Sit to/from [facility] Sit to Stand: Max assist;+2 physical assistance Stand pivot transfers: Max  assist;+2 physical assistance       General transfer comment: Pt was able to hold static standing with bilateral knee block and bilateral support at gait belt. Used bed pad to cradle hips during SPT to chair.   Ambulation/Gait             General Gait Details: Pt is non-ambulatory at baseline.   Stairs            Wheelchair Mobility    Modified Rankin (Stroke Patients Only)       Balance Overall balance assessment: Needs assistance Sitting-balance support: Single extremity supported;Feet supported Sitting balance-Leahy Scale: Good     Standing balance support: Single extremity supported Standing balance-Leahy Scale: Poor Standing balance comment: Blocked Left knee in standing using gait belt less than 1 min as pt and son report that pt does "not stand for long" son apparently transfers pt quickly secondary to weakness and decreased activity tolerance.                             Pertinent Vitals/Pain Pain Assessment: No/denies pain Pain Score: 0-No pain    Home Living Family/patient expects to be discharged to:: Private residence Living Arrangements: Children Available Help at Discharge: Family Type of Home: House Home Access: Level entry     Home Layout: Two level;Able to live on main level with bedroom/bathroom Home Equipment: Wheelchair - manual;Bedside commode      Prior Function Level of Independence: Needs assistance   Gait / Transfers Assistance  Needed: Pt reports that he is bed bound, non-ambulatory. He states that his son or other family member assists w/ all transfers (bed to 3:1 vs w/c/chair only).  ADL's / Homemaking Assistance Needed: Pt reports that son does all homemaking and cooks/cleans        Hand Dominance   Dominant Hand: Right    Extremity/Trunk Assessment   Upper Extremity Assessment: Defer to OT evaluation       LUE Deficits / Details: LUE hemiplegic secondary to CVA 9/15; general PROM WFL's, limited at end  range shoulder flexion.   Lower Extremity Assessment: LLE deficits/detail   LLE Deficits / Details: Less than 3/5 due to stroke.  Cervical / Trunk Assessment: Kyphotic  Communication   Communication: No difficulties  Cognition Arousal/Alertness: Awake/alert Behavior During Therapy: WFL for tasks assessed/performed Overall Cognitive Status: History of cognitive impairments - at baseline                      General Comments      Exercises        Assessment/Plan    PT Assessment Patient needs continued PT services  PT Diagnosis Hemiplegia non-dominant side;Generalized weakness   PT Problem List Decreased strength;Decreased range of motion;Decreased activity tolerance;Decreased mobility;Decreased balance;Decreased knowledge of use of DME;Decreased safety awareness;Decreased knowledge of precautions  PT Treatment Interventions DME instruction;Gait training;Stair training;Functional mobility training;Therapeutic activities;Therapeutic exercise;Neuromuscular re-education;Patient/family education   PT Goals (Current goals can be found in the Care Plan section) Acute Rehab PT Goals Patient Stated Goal: none stated PT Goal Formulation: With patient Time For Goal Achievement: 10/24/14 Potential to Achieve Goals: Fair    Frequency Min 2X/week   Barriers to discharge        Co-evaluation               End of Session Equipment Utilized During Treatment: Gait belt Activity Tolerance: Patient limited by fatigue;Patient limited by lethargy Patient left: in chair;with chair alarm set;with call bell/phone within reach Nurse Communication: Mobility status;Need for lift equipment         Time: 1015-1035 PT Time Calculation (min) (ACUTE ONLY): 20 min   Charges:   PT Evaluation $Initial PT Evaluation Tier I: 1 Procedure PT Treatments $Therapeutic Activity: 8-22 mins   PT G Codes:        Rolinda Roan October 27, 2014, 2:05 PM   Rolinda Roan, PT, DPT Acute  Rehabilitation Services Pager: [phone]

## 2014-10-10 NOTE — Progress Notes (Signed)
[facility]                                                                              Patient Demographics  [name], is a 81 y.o. male, DOB - [birth year], QPY:[id number]  Admit date - 10/09/2014   Admitting Physician Ivor Costa, MD  Outpatient Primary MD for the patient is Adella Hare, MD  LOS - 1   Chief Complaint  Patient presents with  . Constipation      HPI on 10/09/2014 by Dr. Ivor Costa [name] is a 81 y.o. male with hx of CAD, dCHF, chronic anemia, CKD-V, CAD (s/p of CABG), hypertension, hyperlipidemia, diabetes mellitus, GERD, MGUS, atrial fibrillation, who presents with Nausea, generalized weakness, severe constipation.Patient is accompanied by his son. History is obtained from patient and his son. patient report that he has severe constipation, and has not had bowel movement for 10 days. Pt feels more fatigued and c/o nausea whenever he eats. Patient does not have abdominal pain or vomiting. No fever or chills. He does not have symptoms of UTI. Patient has left sided paralysis from previous hemorrhagic stroke. Patient denies fever, chills, cough, chest pain, SOB, abdominal pain, dysuria, urgency, frequency, hematuria, skin rashes, joint pain or leg swelling. Of note, patient is on iron supplements at home. Work up in the ED demonstrates worsening renal function, negative troponin. Patient is admitted to the inpatient for further evaluation and treatment.    Assessment & Plan   Acute on chronic kidney disease, stage IV -Spoke with Dr. Posey Pronto, nephrology, patient has been in denial regarding dialysis, baseline creatinine 3-4 -Creatinine currently 5.93, will continue to monitor [facility] -Nephrology consulted and appreciated -Per son, dialysis has not been mentioned.   Severe Constipation and nausea -Patient and family state he has not had a BM in over 10 days -Patient refuses enema -Continue colace, miralax -Will add on lactulose -Continue  antiemetics  Diabetes Mellitus, type 2 -Continue sliding scale with CBG monitoring  Chronic Systolic CHF -Echo 10/20/56: EF 40% -BNP 678 -Monitor daily weights, intake and output -Continue BB -ACEI avoided due to renal failure  Hypertension -Continue amlodipine and coreg  History of hemorrhagic CVA -Bedbound -Continue statin  Atrial fibrillation -was on coumadin, however due to [place] in 05/2014, coumadin discontinued -Continue coreg and aspirin  Deconditioning and weakness -PT and OT consulted  Code Status: Full  Family Communication: Son and daughter via phone (separately)  Disposition Plan: Admitted  Time Spent in minutes   30 minutes  Procedures  None  Consults   Nephrology  DVT Prophylaxis  heparin  Lab Results  Component Value Date   PLT 88* 10/10/2014    Medications  Scheduled Meds: . amLODipine  10 mg Oral Daily  . aspirin  81 mg Oral Daily  . atorvastatin  20 mg Oral QHS  . carvedilol  6.25 mg Oral BID  . docusate sodium  100 mg Oral BID  . ferrous sulfate  325 mg Oral Q breakfast  . heparin  5,000 Units Subcutaneous 3 times per day  . insulin aspart  0-9 Units Subcutaneous TID WC  . pantoprazole  40 mg Oral Daily  . polyethylene glycol  17  g Oral Daily  . vitamin C  500 mg Oral BID  . zinc sulfate  220 mg Oral Daily   Continuous Infusions:  PRN Meds:.acetaminophen **OR** acetaminophen  Antibiotics    Anti-infectives    None        Subjective:   [name] seen and examined today. Patient states his nausea has improved.  He no longer feels weak.  He does complain of constipation, but does not want an enema.  Denies chest pain or SOB.  Objective:   Filed Vitals:   10/09/14 2115 10/09/14 2200 10/10/14 0431 10/10/14 1001  BP: 157/65 142/80 147/59 157/66  Pulse: 65 64 65 57  Temp:  98.5 F (36.9 C) 99.1 F (37.3 C) 98.8 F (37.1 C)  TempSrc:  Oral Oral Axillary  Resp: 20 18 18 16   Height:  5\' 11"  (1.803 m)    Weight:   79.742 kg (175 lb 12.8 oz)    SpO2: 94% 95% 94% 95%    Wt Readings from Last 3 Encounters:  10/09/14 79.742 kg (175 lb 12.8 oz)  10/07/14 78.951 kg (174 lb 0.9 oz)  08/12/14 86.183 kg (190 lb)     Intake/Output Summary (Last 24 hours) at 10/10/14 1257 Last data filed at 10/10/14 1050  Gross per 24 hour  Intake    320 ml  Output    710 ml  Net   -390 ml    Exam  General: Well developed, well nourished, NAD, appears stated age  41: NCAT,  mucous membranes moist.   Neck: Supple, no JVD, no masses  Cardiovascular: S1 S2 auscultated, no rubs, murmurs or gallops. Regular rate and rhythm.  Respiratory: Clear to auscultation bilaterally with equal chest rise  Abdomen: Soft, nontender, nondistended, + bowel sounds  Extremities: warm dry without cyanosis clubbing or edema  Neuro: AAOx3, cranial nerves grossly intact. Left sided paralysis  Skin: Without rashes exudates or nodules  Psych: Normal affect and demeanor with intact judgement and insight  Data Review   Micro Results No results found for this or any previous visit (from the past 240 hour(s)).  Radiology Reports Dg Chest 2 View  10/05/2014   CLINICAL DATA:  Two day history of difficulty breathing  EXAM: CHEST  2 VIEW  COMPARISON:  July 06, 2013  FINDINGS: There is interstitial edema with bilateral pleural effusions and cardiomegaly. There is mild pulmonary venous hypertension. No consolidation. Patient is status post coronary artery bypass grafting. No appreciable adenopathy. No bone lesions.  IMPRESSION: Congestive heart failure.   Electronically Signed   By: Lowella Grip M.D.   On: 10/05/2014 13:17   Dg Abd Acute W/chest  10/09/2014   CLINICAL DATA:  Loss of appetite, constipation for 10 days, history hypertension, diabetes, chronic kidneys disease, diverticulosis, prior open-heart surgery, coronary artery disease, pulmonary arterial hypertension, monoclonal gammopathy of unknown significance  EXAM: ACUTE  ABDOMEN SERIES (ABDOMEN 2 VIEW & CHEST 1 VIEW)  COMPARISON:  Chest radiograph 10/05/2014 abdominal radiograph 07/24/2008  FINDINGS: Enlargement of cardiac silhouette post CABG.  Stable mediastinal contours.  Bibasilar effusions and atelectasis.  Pulmonary edema slightly improved.  No pneumothorax.  Scattered stool throughout colon including rectum.  Nonobstructive bowel gas pattern.  No bowel dilatation, bowel wall thickening, or free intraperitoneal air.  Bones demineralized.  No urinary tract calcification.  IMPRESSION: Slightly improved CHF with persistent bibasilar effusions and atelectasis.  Normal bowel gas pattern.   Electronically Signed   By: Lavonia Dana M.D.   On: 10/09/2014 15:55  CBC  Recent Labs Lab 10/05/14 1144 10/06/14 0412 10/09/14 1739 10/10/14 0618  WBC 4.0 3.1* 3.5* 3.0*  HGB 10.4* 9.5* 10.3* 9.1*  HCT 32.2* 29.0* 31.5* 27.6*  PLT 123* 81* 106* 88*  MCV 84.7 86.6 85.1 84.7  MCH 27.4 28.4 27.8 27.9  MCHC 32.3 32.8 32.7 33.0  RDW 17.3* 17.3* 17.0* 17.0*  LYMPHSABS  --   --  0.8  --   MONOABS  --   --  0.3  --   EOSABS  --   --  0.0  --   BASOSABS  --   --  0.0  --     Chemistries   Recent Labs Lab 10/05/14 1144 10/06/14 0412 10/07/14 0305 10/09/14 1739 10/10/14 0618  NA 138 137 136 139 141  K 4.8 4.2 4.1 4.3 4.2  CL 111 112 108 103 106  CO2 18* 16* 21 17* 19  GLUCOSE 87 97 86 111* 63*  BUN 30* 32* 32* 36* 34*  CREATININE 5.11* 5.09* 5.49* 6.11* 5.93*  CALCIUM 9.8 9.2 8.9 10.1 9.7  MG 2.0  --   --   --   --   AST 20  --   --  16  --   ALT <5  --   --  8  --   ALKPHOS 76  --   --  75  --   BILITOT 0.7  --   --  0.6  --    ------------------------------------------------------------------------------------------------------------------ estimated creatinine clearance is 10.4 mL/min (by C-G formula based on Cr of 5.93). ------------------------------------------------------------------------------------------------------------------ No results for  input(s): HGBA1C in the last 72 hours. ------------------------------------------------------------------------------------------------------------------ No results for input(s): CHOL, HDL, LDLCALC, TRIG, CHOLHDL, LDLDIRECT in the last 72 hours. ------------------------------------------------------------------------------------------------------------------ No results for input(s): TSH, T4TOTAL, T3FREE, THYROIDAB in the last 72 hours.  Invalid input(s): FREET3 ------------------------------------------------------------------------------------------------------------------ No results for input(s): VITAMINB12, FOLATE, FERRITIN, TIBC, IRON, RETICCTPCT in the last 72 hours.  Coagulation profile  Recent Labs Lab 10/05/14 1144 10/09/14 2303  INR 1.10 1.19    No results for input(s): DDIMER in the last 72 hours.  Cardiac Enzymes No results for input(s): CKMB, TROPONINI, MYOGLOBIN in the last 168 hours.  Invalid input(s): CK ------------------------------------------------------------------------------------------------------------------ Invalid input(s): POCBNP    Marlayna Bannister D.O. on 10/10/2014 at 12:57 PM  Between 7am to 7pm - Pager - [phone]  After 7pm go to [email or web address] - password [email or web address]  And look for the night coverage person covering for me after hours  [facility] Office  [phone]

## 2014-10-10 NOTE — Progress Notes (Signed)
INITIAL NUTRITION ASSESSMENT  Pt meets criteria for SEVERE MALNUTRITION in the context of chronic illnes as evidenced by a 7.8% weight loss in 2 months and severe muscle mass loss.  DOCUMENTATION CODES Per approved criteria  -Severe malnutrition in the context of chronic illness   INTERVENTION: Provide Glucerna Shake po BID, each supplement provides 220 kcal and 10 grams of protein.  Encourage adequate PO intake.  NUTRITION DIAGNOSIS: Increased nutrient needs related to chronic illness as evidenced by estimated nutrition needs.   Goal: Pt to meet >/= 90% of their estimated nutrition needs   Monitor:  PO intake, weight trends, labs, I/O's  Reason for Assessment: MST  81 y.o. male  Admitting Dx: Acute on chronic kidney failure  ASSESSMENT: Pt with hx of CAD, dCHF, chronic anemia, CKD-V, CAD (s/p of CABG), hypertension, hyperlipidemia, diabetes mellitus, GERD, MGUS, atrial fibrillation, who presents with Nausea, generalized weakness, severe constipation.  Pt reports his appetite is good currently and PTA at home with at least 2-3 full meals a day and Ensure BID. Current Meal completion is 25-50%. Per Epic weight records, pt with a 7.8% weight loss in 2 months. Pt is agreeable to oral supplements. RD to order Glucerna Shake. Pt was encouraged to eat his food at meals and to drink his supplements. Pt encouraged to continue taking supplements drinks at home.  Nutrition Focused Physical Exam:  Subcutaneous Fat:  Orbital Region: N/A Upper Arm Region: WNL Thoracic and Lumbar Region: WNL  Muscle:  Temple Region: N/A Clavicle Bone Region: Severe depletion Clavicle and Acromion Bone Region: Severe depletion Scapular Bone Region: N/A Dorsal Hand: N/A Patellar Region: WNL Anterior Thigh Region: WNL Posterior Calf Region: WNL  Edema: +1 LUE, +2 LE edema  Labs: Low GFR. High BUN and creatinine.  Height: Ht Readings from Last 1 Encounters:  10/09/14 5\' 11"  (1.803 m)     Weight: Wt Readings from Last 1 Encounters:  10/09/14 175 lb 12.8 oz (79.742 kg)    Ideal Body Weight: 172 lbs  % Ideal Body Weight: 102%  Wt Readings from Last 10 Encounters:  10/09/14 175 lb 12.8 oz (79.742 kg)  10/07/14 174 lb 0.9 oz (78.951 kg)  08/12/14 190 lb (86.183 kg)  07/10/14 179 lb 14.4 oz (81.602 kg)  06/11/14 178 lb 14.4 oz (81.149 kg)  11/20/13 190 lb 6.4 oz (86.365 kg)  10/01/13 195 lb 6.4 oz (88.633 kg)  08/21/13 193 lb (87.544 kg)  08/06/13 191 lb (86.637 kg)  07/06/13 199 lb 6.4 oz (90.447 kg)    Usual Body Weight: 190 lbs  % Usual Body Weight: 92%  BMI:  Body mass index is 24.53 kg/(m^2).  Estimated Nutritional Needs: Kcal: 1900-2100 Protein: 90-105 grams Fluid: 1.9 - 2.1 L.day  Skin: Diabetic ulcer on left heel  Diet Order: Diet Carb Modified  EDUCATION NEEDS: -No education needs identified at this time   Intake/Output Summary (Last 24 hours) at 10/10/14 0949 Last data filed at 10/10/14 0300  Gross per 24 hour  Intake      0 ml  Output    510 ml  Net   -510 ml    Last BM: 1/2-constipation  Labs:   Recent Labs Lab 10/05/14 1144  10/07/14 0305 10/09/14 1739 10/10/14 0618  NA 138  < > 136 139 141  K 4.8  < > 4.1 4.3 4.2  CL 111  < > 108 103 106  CO2 18*  < > 21 17* 19  BUN 30*  < > 32* 36*  34*  CREATININE 5.11*  < > 5.49* 6.11* 5.93*  CALCIUM 9.8  < > 8.9 10.1 9.7  MG 2.0  --   --   --   --   GLUCOSE 87  < > 86 111* 63*  < > = values in this interval not displayed.  CBG (last 3)   Recent Labs  10/09/14 2254 10/10/14 0742 10/10/14 0810  GLUCAP 74 61* 81    Scheduled Meds: . amLODipine  10 mg Oral Daily  . aspirin  81 mg Oral Daily  . atorvastatin  20 mg Oral QHS  . carvedilol  6.25 mg Oral BID  . docusate sodium  100 mg Oral BID  . ferrous sulfate  325 mg Oral Q breakfast  . heparin  5,000 Units Subcutaneous 3 times per day  . insulin aspart  0-9 Units Subcutaneous TID WC  . pantoprazole  40 mg Oral Daily   . polyethylene glycol  17 g Oral Daily  . vitamin C  500 mg Oral BID  . zinc sulfate  220 mg Oral Daily    Continuous Infusions: . sodium chloride 50 mL/hr at 10/09/14 2252    Past Medical History  Diagnosis Date  . CAD (coronary artery disease)     s/p 3v CABG 2006, myoview 04/2010 EF 49%, prior inferior/apical infarct, no ischemia, LHC 11/2011 stable anatomy (occluded LAD filled from vein graft, distal LCx occluded prior to OM2, OM2 filled from vein graft, RCA prox 25%, mid 40%, distal 25% lesions, SVG to diagonal occluded) Med Rx  . Chronic diastolic heart failure     hx of cardiorenal syndrome;  Echo 06/2012 EF 66-44%, mod diastolic dysfunction, mild MR, mod TR, mild R/LAE, mild RV dilatation, PASP 51mmHg.  . Carotid stenosis     a. Carotid dopplers R 40-59%, Left 60-79%;   b. carotids 12/13:  0-34% RICA, 74-25% LICA (rpt in 6 mos);  c.  Carotid US (11/14):  R 40-59%; L 60-79%, R vertebral occluded, L vertebral antegrade  . HLD (hyperlipidemia)   . Mediastinal adenopathy     per CT chest in 2006 with PET scan showing very limited metabolic activity; not felt to have a significant neoplastic potential  . Iron deficiency anemia   . Hypertension   . Diabetes mellitus     insulin dependent   . GERD (gastroesophageal reflux disease)   . AV block, [place] 1     Noted 11/2011 in hospital, BB stopped  . Colon polyps   . Diverticulosis   . Pulmonary arterial hypertension     a.  RHC 11/1 PASP 60 mmHg (mean 38); b. RHC 11/15 PA pressure 66/24, PCWP 26 and CO 6.2 => Sildenafil started;   c. Echo bubble study 11/13:  no obvious shunt;   d. VQ scan neg for pulmonary embolism  . CKD (chronic kidney disease) stage 5, GFR less than 15 ml/min     hx of cardiorenal syndrome;  Dr Posey Pronto;  AVF recommended and scheduled (patient hesitant to proceed);   Renal US 11/13: diff echogenic kidneys c/w medical renal disease; no hydronephrosis or renal mass  . Hyperparathyroidism, secondary renal   . MGUS  (monoclonal gammopathy of unknown significance)     a. Kappa/Lambda free light chain ratio 11/13:  23.99;  b. Met. Bone survey 11/13:  no osteolytic lesions to suggest MMyeloma  . Atrial flutter     diagnosed 05/2013; Coumadin initiated    Past Surgical History  Procedure Laterality Date  . Appendectomy    .  Coronary artery bypass graft  2006    SVG to OM2, SVG to LAD, SVG to DX; (the LIMA did not have good flow and therefore was not used)  . Cardiac catheterization  12/21/2011  . Left heart catheterization with coronary/graft angiogram N/A 12/21/2011    Procedure: LEFT HEART CATHETERIZATION WITH Beatrix Fetters;  Surgeon: Burnell Blanks, MD;  Location: [facility] CATH LAB;  Service: Cardiovascular;  Laterality: N/A;  . Right heart catheterization Bilateral 07/28/2012    Procedure: RIGHT HEART CATH;  Surgeon: Larey Dresser, MD;  Location: [facility] CATH LAB;  Service: Cardiovascular;  Laterality: Bilateral;  . Right heart catheterization N/A 08/11/2012    Procedure: RIGHT HEART CATH;  Surgeon: Thayer Headings, MD;  Location: [facility] CATH LAB;  Service: Cardiovascular;  Laterality: N/A;    Kallie Locks, MS, RD, LDN Pager # [phone] After hours/ weekend pager # [phone]

## 2014-10-10 NOTE — Consult Note (Addendum)
[name] Admit Date: 10/09/2014 10/10/2014 [name] Requesting Physician:  Parker Kida, MD  Reason for Consult:  Progressive CKD, question of uremia HPI:  81 year old male seen at the request of Dr. Ree Cooke evaluation of progressive increase in serum creatinine. Patient has known chronic kidney disease and follows with [name] at [facility]. He has not been seen in the office since May of last year. He was admitted earlier this year with hypervolemia after a blood transfusion and then returned for admission on 10/09/14 with no bowel movement since 10/08/14 accompanied by nausea, vomiting, and weakness.  Patient's past medical history also includes hemorrhagic stroke with left-sided paralysis, CAD status post CABG, diastolic heart failure, diabetes mellitus, atrial fibrillation.   There has been no dialysis planning to date. The worsening of his kidney function really has occurred over the past 2 months, especially this month.  He tells me that previous conversations with doctors, including [name], were had about dialysis and at that time he had no interest in pursuing it. He has had some experience with this therapy through friends and family members. Today we once again discussed dialysis and if it would be something he would wish to receive if indicated. He is quite clear that he does not want to have dialysis in any form. It is not consistent with his values and goals at his age. He also clearly expresses, without prompting, that untreated kidney failure eventually can lead to death.  CREAT (mg/dL)  Date Value  12/20/2011 1.89*   CREATININE, SER (mg/dL)  Date Value  10/10/2014 5.93*  10/09/2014 6.11*  10/07/2014 5.49*  10/06/2014 5.09*  10/05/2014 5.11*  10/02/2014 4.41*  09/30/2014 4.0*  09/04/2014 3.69*  07/31/2014 3.02*  07/08/2014 2.78*  ] I/Os:  ROS Balance of 12 systems is negative w/ exceptions as above  PMH  Past Medical History  Diagnosis  Date  . CAD (coronary artery disease)     s/p 3v CABG 2006, myoview 04/2010 EF 49%, prior inferior/apical infarct, no ischemia, LHC 11/2011 stable anatomy (occluded LAD filled from vein graft, distal LCx occluded prior to OM2, OM2 filled from vein graft, RCA prox 25%, mid 40%, distal 25% lesions, SVG to diagonal occluded) Med Rx  . Chronic diastolic heart failure     hx of cardiorenal syndrome;  Echo 06/2012 EF 40-98%, mod diastolic dysfunction, mild MR, mod TR, mild R/LAE, mild RV dilatation, PASP 72mmHg.  . Carotid stenosis     a. Carotid dopplers R 40-59%, Left 60-79%;   b. carotids 12/13:  1-19% RICA, 14-78% LICA (rpt in 6 mos);  c.  Carotid US (11/14):  R 40-59%; L 60-79%, R vertebral occluded, L vertebral antegrade  . HLD (hyperlipidemia)   . Mediastinal adenopathy     per CT chest in 2006 with PET scan showing very limited metabolic activity; not felt to have a significant neoplastic potential  . Iron deficiency anemia   . Hypertension   . Diabetes mellitus     insulin dependent   . GERD (gastroesophageal reflux disease)   . AV block, Mobitz 1     Noted 11/2011 in hospital, BB stopped  . Colon polyps   . Diverticulosis   . Pulmonary arterial hypertension     a.  RHC 11/1 PASP 60 mmHg (mean 38); b. RHC 11/15 PA pressure 66/24, PCWP 26 and CO 6.2 => Sildenafil started;   c. Echo bubble study 11/13:  no obvious shunt;   d. VQ scan neg for pulmonary  embolism  . CKD (chronic kidney disease) stage 5, GFR less than 15 ml/min     hx of cardiorenal syndrome;  Dr Posey Cooke;  AVF recommended and scheduled (patient hesitant to proceed);   Renal US 11/13: diff echogenic kidneys c/w medical renal disease; no hydronephrosis or renal mass  . Hyperparathyroidism, secondary renal   . MGUS (monoclonal gammopathy of unknown significance)     a. Kappa/Lambda free light chain ratio 11/13:  23.99;  b. Met. Bone survey 11/13:  no osteolytic lesions to suggest MMyeloma  . Atrial flutter     diagnosed 05/2013;  Coumadin initiated   [facility]  Past Surgical History  Procedure Laterality Date  . Appendectomy    . Coronary artery bypass graft  2006    SVG to OM2, SVG to LAD, SVG to DX; (the LIMA did not have good flow and therefore was not used)  . Cardiac catheterization  12/21/2011  . Left heart catheterization with coronary/graft angiogram N/A 12/21/2011    Procedure: LEFT HEART CATHETERIZATION WITH Beatrix Fetters;  Surgeon: Burnell Blanks, MD;  Location: [facility] CATH LAB;  Service: Cardiovascular;  Laterality: N/A;  . Right heart catheterization Bilateral 07/28/2012    Procedure: RIGHT HEART CATH;  Surgeon: Larey Dresser, MD;  Location: [facility] CATH LAB;  Service: Cardiovascular;  Laterality: Bilateral;  . Right heart catheterization N/A 08/11/2012    Procedure: RIGHT HEART CATH;  Surgeon: Thayer Headings, MD;  Location: [facility] CATH LAB;  Service: Cardiovascular;  Laterality: N/A;   FH  Family History  Problem Relation Age of Onset  . Stomach cancer Sister   . Pancreatic cancer Brother   . Lung cancer Brother   . Colon cancer Neg Hx    SH  reports that he quit smoking about 61 years ago. He has never used smokeless tobacco. He reports that he does not drink alcohol or use illicit drugs. Allergies No Known Allergies Home medications Prior to Admission medications   Medication Sig Start Date End Date Taking? Authorizing Provider  amLODipine (NORVASC) 10 MG tablet Take 10 mg by mouth daily.   Yes Historical Provider, MD  aspirin 81 MG chewable tablet Chew 1 tablet (81 mg total) by mouth daily. 10/07/14  Yes Kelvin Cellar, MD  atorvastatin (LIPITOR) 20 MG tablet take 1 tablet by mouth at bedtime 09/14/13  Yes Lelon Perla, MD  carvedilol (COREG) 6.25 MG tablet Take 1 tablet (6.25 mg total) by mouth 2 (two) times daily. 09/05/14  Yes Mauricio Po, FNP  docusate sodium 100 MG CAPS Take 100 mg by mouth 2 (two) times daily. 10/07/14  Yes Kelvin Cellar, MD  ferrous sulfate 325 (65 FE) MG  tablet Take 1 tablet (325 mg total) by mouth daily with breakfast. 10/07/14  Yes Kelvin Cellar, MD  linagliptin (TRADJENTA) 5 MG TABS tablet Take 1 tablet (5 mg total) by mouth daily. 09/30/14  Yes Mauricio Po, FNP  pantoprazole (PROTONIX) 40 MG tablet Take 1 tablet (40 mg total) by mouth daily. 09/30/14  Yes Mauricio Po, FNP  polyethylene glycol powder (GLYCOLAX/MIRALAX) powder Take 1 Container by mouth daily as needed for mild constipation.    Yes Historical Provider, MD  sulfamethoxazole-trimethoprim (BACTRIM DS,SEPTRA DS) 800-160 MG per tablet Take 1 tablet by mouth 2 (two) times daily. 09/30/14  Yes Mauricio Po, FNP  vitamin C (ASCORBIC ACID) 500 MG tablet Take 500 mg by mouth 2 (two) times daily.   Yes Historical Provider, MD  Vitamin D, Ergocalciferol, (DRISDOL) 50000 UNITS CAPS capsule Take  50,000 Units by mouth every Friday.    Yes Historical Provider, MD  zinc gluconate 50 MG tablet Take 50 mg by mouth daily.   Yes Historical Provider, MD    Current Medications Scheduled Meds: . amLODipine  10 mg Oral Daily  . aspirin  81 mg Oral Daily  . atorvastatin  20 mg Oral QHS  . carvedilol  6.25 mg Oral BID  . docusate sodium  100 mg Oral BID  . feeding supplement (GLUCERNA SHAKE)  237 mL Oral BID BM  . ferrous sulfate  325 mg Oral Q breakfast  . heparin  5,000 Units Subcutaneous 3 times per day  . insulin aspart  0-9 Units Subcutaneous TID WC  . pantoprazole  40 mg Oral Daily  . polyethylene glycol  17 g Oral Daily  . vitamin C  500 mg Oral BID  . zinc sulfate  220 mg Oral Daily   Continuous Infusions:  PRN Meds:.acetaminophen **OR** acetaminophen  CBC  Recent Labs Lab 10/06/14 0412 10/09/14 1739 10/10/14 0618  WBC 3.1* 3.5* 3.0*  NEUTROABS  --  2.4  --   HGB 9.5* 10.3* 9.1*  HCT 29.0* 31.5* 27.6*  MCV 86.6 85.1 84.7  PLT 81* 106* 88*   Basic Metabolic Panel  Recent Labs Lab 10/05/14 1144 10/06/14 0412 10/07/14 0305 10/09/14 1739 10/10/14 0618  NA 138 137 136  139 141  K 4.8 4.2 4.1 4.3 4.2  CL 111 112 108 103 106  CO2 18* 16* 21 17* 19  GLUCOSE 87 97 86 111* 63*  BUN 30* 32* 32* 36* 34*  CREATININE 5.11* 5.09* 5.49* 6.11* 5.93*  CALCIUM 9.8 9.2 8.9 10.1 9.7    Physical Exam  Blood pressure 157/66, pulse 57, temperature 98.8 F (37.1 C), temperature source Axillary, resp. rate 16, height $RemoveBe'5\' 11"'dEFORZVAQ$  (1.803 m), weight 79.742 kg (175 lb 12.8 oz), SpO2 95 %. GEN: NAD, resting peacefully in bed ENT: NCAT, fair dentition EYES: EOMI, arcus cornealis CV: RRR, no rub, normal S1/S2 PULM: CTA B ABD: Soft, nontender. Quiet bowel sounds, no rebound or guarding SKIN: No rashes or lesions EXT: No lower extremity edema   Assessment/Plan 81 year old male with progressive CKD in the setting of constipation and no bowel movement for 12 days. He has several severe comorbidities including coronary artery disease, congestive heart failure, left-sided paralysis related to a cerebral hemorrhage previously.  He is a marginal candidate for dialysis and does not wish to receive dialysis and clearly understands the consequences and risks of doing so.  1. Progressive CKD5 1. Partly related to volume status and recent acute illness 2. Hopefully he will have some stability and/or improvements with treatment of his constipation 3. Patient will not pursue hemodialysis or vascular access planning at this time 4. Electrolytes are largely stable. If after resolution of constipation he has a persistent acidosis can consider initiation of sodium bicarbonate therapy to target serum bicarbonate greater than 22 2. Constipation 1. Avoid Phospho-Soda and magnesium-based bowel preps given his low GFR 2. Sorbitol would be another safe choice 3. Nausea, vomiting 1. Probably related to #2, question if there is some uremia present as well but doubt given very low BUN 2. Per primary team   Pearson Grippe MD [phone] pgr 10/10/2014, 3:12 PM

## 2014-10-10 NOTE — Evaluation (Signed)
Occupational Therapy Evaluation Patient Details Name: [name] MRN: [id number] DOB: 08/23/1931 Today's Date: 10/10/2014    History of Present Illness Pt adm with acute pulmonary edema. 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    Clinical Impression   Pt presents as above which is impacting his ability to perform ADL's and functional mobility/SPT. Pt appears close to baseline level but should benefit from trial of acute OT for transfer training to allow for increased independence and decreased burden of care related to ADL's.    Follow Up Recommendations  Home health OT;Supervision/Assistance - 24 hour    Equipment Recommendations  Hospital bed;Other (comment) (Drop arm 3:1 and lift at home for transfers)    Recommendations for Other Services       Precautions / Restrictions Precautions Precautions: Fall Restrictions Weight Bearing Restrictions: No      Mobility Bed Mobility Overal bed mobility:  (Pt up in recliner upon OT arrival)                Transfers Overall transfer level: Needs assistance Equipment used:  (Gait belt, blocked Left knee in standing) Transfers: Sit to/from Stand Sit to Stand: +2 physical assistance;Max assist;Mod assist Stand pivot transfers: Total assist;+2 physical assistance            Balance Overall balance assessment: Needs assistance Sitting-balance support: Single extremity supported;Feet supported Sitting balance-Leahy Scale: Good     Standing balance support: Single extremity supported Standing balance-Leahy Scale: Poor Standing balance comment: Blocked Left knee in standing using gait belt less than 1 min as pt and son report that pt does "not stand for long" son apparently transfers pt quickly secondary to weakness and decreased activity tolerance.                           ADL Overall ADL's : Needs assistance/impaired Eating/Feeding: Set up;Sitting;Bed level   Grooming: Wash/dry face;Wash/dry hands;Sitting;Supervision/safety;Min guard   Upper Body Bathing: Minimal assitance;Bed level;Sitting   Lower Body Bathing: Bed level;Maximal assistance   Upper Body Dressing : Moderate assistance;Bed level   Lower Body Dressing: Bed level;Maximal assistance   Toilet Transfer: +2 for physical assistance;Stand-pivot;BSC;Requires drop arm;Maximal assistance;With caregiver independent assisting   Toileting- Clothing Manipulation and Hygiene: +2 for physical assistance;Sit to/from stand;Maximal assistance     Tub/Shower Transfer Details (indicate cue type and reason): NT pt states that he sponge bathes at bed level w/ son assisting PRN. States he does not get into shower Functional mobility during ADLs:  (Pt is non-ambulatory and transfers from EOB to 3:1, recliner occasionally and w/c with son assisting. Pt is +1 Max A sit to stand from EOB and +2 total assist SPT from EOB to 3:1 etc, requires drop arm commode) General ADL Comments: Pt was assessed for OT acute care needs, he appears to be close to baseline level as he is non-ambulatory and only performs SPT from EOB to 3:1 during day per his report. His son does all homemaking and meal prep, he states that he has 3:1, but may benefit from drop arm 3:1 for safety during transfers as well as hoyer lift. Pt's son is present today and states that pt may benefit from transfer training and possible [place] for assessment and recommendations after d/c.     Vision  Wears glasses at all times, does not have here "  they are at home"                   Perception     Praxis      Pertinent Vitals/Pain Pain Assessment: No/denies pain (Pt reports intermittent nausea, not currently) Pain Score: 0-No pain     Hand Dominance Right   Extremity/Trunk Assessment Upper Extremity Assessment Upper Extremity Assessment: LUE  deficits/detail;Generalized weakness LUE Deficits / Details: LUE hemiplegic secondary to CVA 9/15; general PROM WFL's, limited at end range shoulder flexion.   Lower Extremity Assessment Lower Extremity Assessment: Defer to PT evaluation       Communication Communication Communication: No difficulties   Cognition Arousal/Alertness: Awake/alert Behavior During Therapy: WFL for tasks assessed/performed Overall Cognitive Status: History of cognitive impairments - at baseline                     General Comments       Exercises       Shoulder Instructions      Home Living Family/patient expects to be discharged to:: Private residence Living Arrangements: Children Available Help at Discharge: Family Type of Home: House Home Access: Level entry     Home Layout: Two level;Able to live on main level with bedroom/bathroom Alternate Level Stairs-Number of Steps: 1 flight Alternate Level Stairs-Rails: Left Bathroom Shower/Tub: Occupational psychologist: Standard Bathroom Accessibility: Yes How Accessible: Accessible via walker Home Equipment: Wheelchair - manual;Bedside commode          Prior Functioning/Environment Level of Independence: Needs assistance  Gait / Transfers Assistance Needed: Pt reports that he is bed bound, non-ambulatory. He states that his son or other family member assists w/ all transfers (bed to 3:1 vs w/c/chair only). ADL's / Homemaking Assistance Needed: Pt reports that son does all homemaking and cooks/cleans Communication / Swallowing Assistance Needed: WFL's      OT Diagnosis: Generalized weakness;Paresis;Hemiplegia non-dominant side   OT Problem List: Decreased strength;Decreased activity tolerance;Impaired balance (sitting and/or standing);Impaired UE functional use;Decreased knowledge of use of DME or AE   OT Treatment/Interventions: Self-care/ADL training;DME and/or AE instruction;Patient/family education;Therapeutic  activities;Balance training    OT Goals(Current goals can be found in the care plan section) Acute Rehab OT Goals Patient Stated Goal: none stated Time For Goal Achievement: 10/24/14 Potential to Achieve Goals: Fair  OT Frequency: Min 2X/week   Barriers to D/C:            Co-evaluation              End of Session Equipment Utilized During Treatment: Gait belt;Other (comment) (Blocked L knee) Nurse Communication: Mobility status;Need for lift equipment;Other (comment) (Recommend lift for safety at home vs transfer training w/ family/pt, would benefit from drop arm 3:1 at home )  Activity Tolerance: Patient tolerated treatment well Patient left: in chair;with call bell/phone within reach;with chair alarm set;with family/visitor present   Time: 6568-1275 OT Time Calculation (min): 18 min Charges:  OT General Charges $OT Visit: 1 Procedure OT Evaluation $Initial OT Evaluation Tier I: 1 Procedure OT Treatments $Therapeutic Activity: 8-22 mins G-Codes:    Almyra Deforest, OTR/L 10/10/2014, 12:55 PM

## 2014-10-10 NOTE — Progress Notes (Signed)
Admission note:  Arrival Method: Arrived on stretcher from ED Mental Orientation: Alert and oriented to person, place and situation, disoriented to time Telemetry: N/A Assessment: See flowsheets for assessment Skin: Unstagable pressure ulcers to both heels. Foam dressings changed and pressure boots applied. IV: IV in right wrist with NS going at 50. Clean, dry and intact Pain: Pt states no pain at this time Tubes: IV line secured Safety Measures: Socks placed, bed in lowest position, call light within reach Fall Prevention Safety Plan: Plan reviewed with pt and pt's son Admission Screening: Completed 6700 Orientation: Patient has been oriented to the unit, staff and to the room.  Will continue to monitor pt  Shelbie Hutching, RN

## 2014-10-10 NOTE — Progress Notes (Signed)
Hypoglycemic Event  CBG: 61  Treatment: 15 GM carbohydrate snack  Symptoms: None  Follow-up CBG: [id number] CBG Result:81  Possible Reasons for Event: Unknown  Comments/MD notified:Dr. Olin Pia

## 2014-10-11 DIAGNOSIS — E43 Unspecified severe protein-calorie malnutrition: Secondary | ICD-10-CM | POA: Insufficient documentation

## 2014-10-11 DIAGNOSIS — D509 Iron deficiency anemia, unspecified: Secondary | ICD-10-CM

## 2014-10-11 DIAGNOSIS — N185 Chronic kidney disease, stage 5: Secondary | ICD-10-CM

## 2014-10-11 DIAGNOSIS — I5032 Chronic diastolic (congestive) heart failure: Secondary | ICD-10-CM

## 2014-10-11 LAB — GLUCOSE, CAPILLARY
GLUCOSE-CAPILLARY: 80 mg/dL (ref 70–99)
Glucose-Capillary: 86 mg/dL (ref 70–99)
Glucose-Capillary: 111 mg/dL — ABNORMAL HIGH (ref 70–99)

## 2014-10-11 MED ORDER — GLUCERNA SHAKE PO LIQD: 237.0000 mL | Freq: Two times a day (BID) | ORAL | Status: DC

## 2014-10-11 NOTE — Plan of Care (Signed)
Problem: Phase II Progression Outcomes Goal: Dialysis initiated Outcome: Not Met (add Reason) Pt refusing dialysis

## 2014-10-11 NOTE — Clinical Documentation Improvement (Signed)
  Conflicting documentation in chart. H&P and Consult Note states "Chronic Diastolic CHF", PN states "Chronic Systolic CHF". Please clarify documentation.  Possible Conditions -- Chronic diastolic CHF -- Chronic systolic CHF -- Chronic combined CHF  Thank you,  Ezekiel Ina ,RN Clinical Documentation Specialist:  [phone]  [place] Information Management

## 2014-10-11 NOTE — Care Management Note (Signed)
CARE MANAGEMENT NOTE 10/11/2014  Patient:  [name], [name]   Account Number:  [id number]  Date Initiated:  10/11/2014  Documentation initiated by:  Kristie Bracewell  Subjective/Objective Assessment:   CM following for progression and d/c needs.     Action/Plan:   Met with pt who is active with [place] for [facility] needs. Spoke with [place], order to resume services placed and pt in agreement to these services.   Anticipated DC Date:     Anticipated DC Plan:  [place]         Choice offered to / List presented to:  C-1 Patient        [place] arranged  HH-1 RN  [place]      [place] agency  [place] [facility]   Status of service:  Completed, signed off Medicare Important Message given?  YES (If response is "NO", the following Medicare IM given date fields will be blank) Date Medicare IM given:  10/11/2014 Medicare IM given by:  Nikeia Henkes Date Additional Medicare IM given:   Additional Medicare IM given by:    Discharge Disposition:  [place]  Per UR Regulation:    If discussed at Long Length of Stay Meetings, dates discussed:    Comments:

## 2014-10-11 NOTE — Discharge Summary (Signed)
Physician Discharge Summary  [name] VZD:[id number] DOB: [birth year] DOA: 10/09/2014  PCP: Adella Hare, MD  Admit date: 10/09/2014 Discharge date: 10/11/2014  Time spent: 45 minutes  Recommendations for Outpatient Follow-up:  Patient will be discharged to home with home health services.  He should continue his medications as prescribed.  Patient should followup with his primary care physician within one week of discharged and follow up with his nephrologist within 1 week of discharge.  Patient should continue a heart healthy/carb modified diet with 1221ml fluid restriction per day.  Discharge Diagnoses:  Acute on chronic kidney disease, stage IV Severe constipation nausea Diabetes mellitus, type II Chronic diastolic/systolic CHF Hypertension History of hemorrhagic CVA Atrial fibrillation Deconditioning and weakness Severe protein calorie malnutrition  Discharge Condition: Stable  Diet recommendation: heart healthy/carb modified diet with 1241ml fluid restriction per day.  Filed Weights   10/09/14 2200 10/10/14 2100  Weight: 79.742 kg (175 lb 12.8 oz) 79.5 kg (175 lb 4.3 oz)    History of present illness:  on 10/09/2014 by Dr. Ivor Costa [name] is a 81 y.o. male with hx of CAD, dCHF, chronic anemia, CKD-V, CAD (s/p of CABG), hypertension, hyperlipidemia, diabetes mellitus, GERD, MGUS, atrial fibrillation, who presents with Nausea, generalized weakness, severe constipation.Patient is accompanied by his son. History is obtained from patient and his son. patient report that he has severe constipation, and has not had bowel movement for 10 days. Pt feels more fatigued and c/o nausea whenever he eats. Patient does not have abdominal pain or vomiting. No fever or chills. He does not have symptoms of UTI. Patient has left sided paralysis from previous hemorrhagic stroke. Patient denies fever, chills, cough, chest pain, SOB, abdominal pain, dysuria, urgency, frequency,  hematuria, skin rashes, joint pain or leg swelling. Of note, patient is on iron supplements at home. Work up in the ED demonstrates worsening renal function, negative troponin. Patient is admitted to the inpatient for further evaluation and treatment.   Hospital Course:  Acute on chronic kidney disease, stage IV -Spoke with Dr. Posey Pronto, nephrology, patient has been in denial regarding dialysis, baseline creatinine 3-4 -Creatinine currently 5.93 -Nephrology consulted and appreciated, and patient spoke with nephrologist and he did not want dialysis -Patient should followup with Dr. Posey Pronto upon discharge  Severe Constipation and nausea -Resolved, patient was able to have a bowel movement (after 11 days) -He no longer complains of nausea -Continue colace, miralax  Diabetes Mellitus, type 2 -Continue sliding scale with CBG monitoring  Chronic Systolic/diastolic CHF -Echo 2/95/18: EF 40% (no mention of diastolic function) -BNP 841 -Continue coreg -ACEI avoided due to renal failure  Hypertension -Continue amlodipine and coreg  History of hemorrhagic CVA -Bedbound -Continue statin  Atrial fibrillation -was on coumadin, however due to [place] in 05/2014, coumadin discontinued -Continue coreg and aspirin  Deconditioning and weakness -PT and OT consulted and recommended home health  Severe protein calorie malnutrition -?Complicated by constipation -Nutrition consult is appreciated -Continue meal supplements  Procedures: None  Consultations: Nephrology  Discharge Exam: Filed Vitals:   10/11/14 0952  BP: 160/80  Pulse: 65  Temp: 98.1 F (36.7 C)  Resp: 18     General: Well developed, NAD, appears stated age  HEENT: NCAT,mucous membranes moist.  Cardiovascular: S1 S2 auscultated, RRR  Respiratory: Clear to auscultation bilaterally with equal chest rise  Abdomen: Soft, nontender, nondistended, + bowel sounds  Extremities: warm dry without cyanosis clubbing or  edema  Discharge Instructions      Discharge Instructions  Discharge instructions    Complete by:  As directed   Patient will be discharged to home with home health services.  He should continue his medications as prescribed.  Patient should followup with his primary care physician within one week of discharged and follow up with his nephrologist within 1 week of discharge.  Patient should continue a heart healthy/carb modified diet with 125ml fluid restriction per day.            Medication List    STOP taking these medications        sulfamethoxazole-trimethoprim 800-160 MG per tablet  Commonly known as:  BACTRIM DS,SEPTRA DS      TAKE these medications        amLODipine 10 MG tablet  Commonly known as:  NORVASC  Take 10 mg by mouth daily.     aspirin 81 MG chewable tablet  Chew 1 tablet (81 mg total) by mouth daily.     atorvastatin 20 MG tablet  Commonly known as:  LIPITOR  take 1 tablet by mouth at bedtime     carvedilol 6.25 MG tablet  Commonly known as:  COREG  Take 1 tablet (6.25 mg total) by mouth 2 (two) times daily.     DSS 100 MG Caps  Take 100 mg by mouth 2 (two) times daily.     feeding supplement (GLUCERNA SHAKE) Liqd  Take 237 mLs by mouth 2 (two) times daily between meals.     ferrous sulfate 325 (65 FE) MG tablet  Take 1 tablet (325 mg total) by mouth daily with breakfast.     linagliptin 5 MG Tabs tablet  Commonly known as:  TRADJENTA  Take 1 tablet (5 mg total) by mouth daily.     pantoprazole 40 MG tablet  Commonly known as:  PROTONIX  Take 1 tablet (40 mg total) by mouth daily.     polyethylene glycol powder powder  Commonly known as:  GLYCOLAX/MIRALAX  Take 1 Container by mouth daily as needed for mild constipation.     vitamin C 500 MG tablet  Commonly known as:  ASCORBIC ACID  Take 500 mg by mouth 2 (two) times daily.     Vitamin D (Ergocalciferol) 50000 UNITS Caps capsule  Commonly known as:  DRISDOL  Take 50,000 Units by  mouth every Friday.     zinc gluconate 50 MG tablet  Take 50 mg by mouth daily.       No Known Allergies Follow-up Information    Follow up with Adella Hare, MD. Schedule an appointment as soon as possible for a visit in 1 week.   Specialty:  Internal Medicine   Why:  Hospital followup       Follow up with Ulla Potash., MD.   Specialty:  Nephrology   Why:  Hospital followup   Contact information:   [place] [address] [phone]        The results of significant diagnostics from this hospitalization (including imaging, microbiology, ancillary and laboratory) are listed below for reference.    Significant Diagnostic Studies: Dg Chest 2 View  10/05/2014   CLINICAL DATA:  Two day history of difficulty breathing  EXAM: CHEST  2 VIEW  COMPARISON:  July 06, 2013  FINDINGS: There is interstitial edema with bilateral pleural effusions and cardiomegaly. There is mild pulmonary venous hypertension. No consolidation. Patient is status post coronary artery bypass grafting. No appreciable adenopathy. No bone lesions.  IMPRESSION: Congestive heart failure.   Electronically Signed  By: Lowella Grip M.D.   On: 10/05/2014 13:17   US Renal  10/10/2014   CLINICAL DATA:  81 year old male with acute kidney injury. History of hypertension and diabetes.  EXAM: RENAL/URINARY TRACT ULTRASOUND COMPLETE  COMPARISON:  Renal ultrasound 08/04/2012.  FINDINGS: Right Kidney:  Length: 11.5 cm. Mild diffuse increased cortical echogenicity. No mass or hydronephrosis visualized.  Left Kidney:  Length: 11.7 cm. Mild diffuse increased cortical echogenicity. No mass or hydronephrosis visualized.  Bladder:  Appears normal for degree of bladder distention.  Other Findings: Bilateral pleural effusions. Incidental imaging of the gallbladder demonstrates multiple dependent echogenic foci with posterior acoustic shadowing, compatible with gallstones.  IMPRESSION: 1. Mild diffuse increased cortical  echogenicity, suggesting underlying medical renal disease. 2. No hydronephrosis. 3. Normal appearance of the urinary bladder. 4. Cholelithiasis. 5. Bilateral pleural effusions.   Electronically Signed   By: Vinnie Langton M.D.   On: 10/10/2014 15:53   Dg Abd Acute W/chest  10/09/2014   CLINICAL DATA:  Loss of appetite, constipation for 10 days, history hypertension, diabetes, chronic kidneys disease, diverticulosis, prior open-heart surgery, coronary artery disease, pulmonary arterial hypertension, monoclonal gammopathy of unknown significance  EXAM: ACUTE ABDOMEN SERIES (ABDOMEN 2 VIEW & CHEST 1 VIEW)  COMPARISON:  Chest radiograph 10/05/2014 abdominal radiograph 07/24/2008  FINDINGS: Enlargement of cardiac silhouette post CABG.  Stable mediastinal contours.  Bibasilar effusions and atelectasis.  Pulmonary edema slightly improved.  No pneumothorax.  Scattered stool throughout colon including rectum.  Nonobstructive bowel gas pattern.  No bowel dilatation, bowel wall thickening, or free intraperitoneal air.  Bones demineralized.  No urinary tract calcification.  IMPRESSION: Slightly improved CHF with persistent bibasilar effusions and atelectasis.  Normal bowel gas pattern.   Electronically Signed   By: Lavonia Dana M.D.   On: 10/09/2014 15:55    Microbiology: No results found for this or any previous visit (from the past 240 hour(s)).   Labs: Basic Metabolic Panel:  Recent Labs Lab 10/05/14 1144 10/06/14 0412 10/07/14 0305 10/09/14 1739 10/10/14 0618  NA 138 137 136 139 141  K 4.8 4.2 4.1 4.3 4.2  CL 111 112 108 103 106  CO2 18* 16* 21 17* 19  GLUCOSE 87 97 86 111* 63*  BUN 30* 32* 32* 36* 34*  CREATININE 5.11* 5.09* 5.49* 6.11* 5.93*  CALCIUM 9.8 9.2 8.9 10.1 9.7  MG 2.0  --   --   --   --    Liver Function Tests:  Recent Labs Lab 10/05/14 1144 10/09/14 1739  AST 20 16  ALT <5 8  ALKPHOS 76 75  BILITOT 0.7 0.6  PROT 7.2 6.8  ALBUMIN 3.6 3.3*   No results for input(s):  LIPASE, AMYLASE in the last 168 hours. No results for input(s): AMMONIA in the last 168 hours. CBC:  Recent Labs Lab 10/05/14 1144 10/06/14 0412 10/09/14 1739 10/10/14 0618  WBC 4.0 3.1* 3.5* 3.0*  NEUTROABS  --   --  2.4  --   HGB 10.4* 9.5* 10.3* 9.1*  HCT 32.2* 29.0* 31.5* 27.6*  MCV 84.7 86.6 85.1 84.7  PLT 123* 81* 106* 88*   Cardiac Enzymes: No results for input(s): CKTOTAL, CKMB, CKMBINDEX, TROPONINI in the last 168 hours. BNP: BNP (last 3 results) No results for input(s): PROBNP in the last 8760 hours. CBG:  Recent Labs Lab 10/10/14 1158 10/10/14 1646 10/10/14 2102 10/11/14 0801 10/11/14 0810  GLUCAP 100* 137* 103* 86 80       Signed:  Cristal Ford  [facility]  10/11/2014, 10:22 AM

## 2014-10-11 NOTE — Discharge Instructions (Signed)

## 2014-10-11 NOTE — Plan of Care (Signed)
Problem: Phase I Progression Outcomes Goal: OOB as tolerated unless otherwise ordered Outcome: Completed/Met Date Met:  10/11/14 At baseline

## 2014-10-11 NOTE — Progress Notes (Signed)
Patient Discharge: Disposition: Patient discharged home with son. Education: Patient's son educated about the follow-up appointments, medications, discharge instructions and also easy to read handout on constipation. IV: Discontinued before discharge. Transportation: Patient transported in w/c with staff and son accompanying. Belongings: Patient took all his belongings with him.

## 2014-10-16 ENCOUNTER — Encounter (HOSPITAL_COMMUNITY)
Admission: RE | Admit: 2014-10-16 | Discharge: 2014-10-16 | Disposition: A | Discharge status: Home / Self Care | Payer: Medicare HMO | Source: Ambulatory Visit | Attending: Nephrology | Admitting: Nephrology

## 2014-10-16 DIAGNOSIS — N184 Chronic kidney disease, stage 4 (severe): Secondary | ICD-10-CM | POA: Diagnosis not present

## 2014-10-16 DIAGNOSIS — D631 Anemia in chronic kidney disease: Secondary | ICD-10-CM | POA: Diagnosis not present

## 2014-10-16 MED ORDER — EPOETIN ALFA 10000 UNIT/ML IJ SOLN
10000.0000 [IU] | INTRAMUSCULAR | Status: DC
  Administered 2014-10-16: 10000 [IU] via SUBCUTANEOUS

## 2014-10-16 MED ORDER — EPOETIN ALFA 10000 UNIT/ML IJ SOLN
INTRAMUSCULAR | Status: AC
  Filled 2014-10-16: qty 1

## 2014-10-17 ENCOUNTER — Ambulatory Visit (INDEPENDENT_AMBULATORY_CARE_PROVIDER_SITE_OTHER): Payer: Medicare HMO | Admitting: Family

## 2014-10-17 VITALS — BP 177/88 | HR 64 | Temp 98.5°F | Resp 18

## 2014-10-17 DIAGNOSIS — N189 Chronic kidney disease, unspecified: Secondary | ICD-10-CM

## 2014-10-17 DIAGNOSIS — N179 Acute kidney failure, unspecified: Secondary | ICD-10-CM

## 2014-10-17 DIAGNOSIS — K5900 Constipation, unspecified: Secondary | ICD-10-CM

## 2014-10-17 DIAGNOSIS — I5033 Acute on chronic diastolic (congestive) heart failure: Secondary | ICD-10-CM

## 2014-10-17 LAB — POCT HEMOGLOBIN-HEMACUE: Hemoglobin: 9.7 g/dL — ABNORMAL LOW (ref 13.0–17.0)

## 2014-10-17 NOTE — Patient Instructions (Addendum)
Thank you for choosing [facility].  Summary/Instructions:  Please contact Dr. Jacalyn Lefevre office for a follow up of his heart failure.  Please follow up with Dr. Posey Pronto regarding your kidneys.   Referrals have been made during this visit. You should expect to hear back from our schedulers in about 7-10 days in regards to establishing an appointment with the specialists we discussed.   If your symptoms worsen or fail to improve, please contact our office for further instruction, or in case of emergency go directly to the emergency room at the closest medical facility.    Constipation Constipation is when a person has fewer than three bowel movements a week, has difficulty having a bowel movement, or has stools that are dry, hard, or larger than normal. As people grow older, constipation is more common. If you try to fix constipation with medicines that make you have a bowel movement (laxatives), the problem may get worse. Long-term laxative use may cause the muscles of the colon to become weak. A low-fiber diet, not taking in enough fluids, and taking certain medicines may make constipation worse.  CAUSES   Certain medicines, such as antidepressants, pain medicine, iron supplements, antacids, and water pills.   Certain diseases, such as diabetes, irritable bowel syndrome (IBS), thyroid disease, or depression.   Not drinking enough water.   Not eating enough fiber-rich foods.   Stress or travel.   Lack of physical activity or exercise.   Ignoring the urge to have a bowel movement.   Using laxatives too much.  SIGNS AND SYMPTOMS   Having fewer than three bowel movements a week.   Straining to have a bowel movement.   Having stools that are hard, dry, or larger than normal.   Feeling full or bloated.   Pain in the lower abdomen.   Not feeling relief after having a bowel movement.  DIAGNOSIS  Your health care provider will take a medical history and  perform a physical exam. Further testing may be done for severe constipation. Some tests may include:  A barium enema X-ray to examine your rectum, colon, and, sometimes, your small intestine.   A sigmoidoscopy to examine your lower colon.   A colonoscopy to examine your entire colon. TREATMENT  Treatment will depend on the severity of your constipation and what is causing it. Some dietary treatments include drinking more fluids and eating more fiber-rich foods. Lifestyle treatments may include regular exercise. If these diet and lifestyle recommendations do not help, your health care provider may recommend taking over-the-counter laxative medicines to help you have bowel movements. Prescription medicines may be prescribed if over-the-counter medicines do not work.  HOME CARE INSTRUCTIONS   Eat foods that have a lot of fiber, such as fruits, vegetables, whole grains, and beans.  Limit foods high in fat and processed sugars, such as french fries, hamburgers, cookies, candies, and soda.   A fiber supplement may be added to your diet if you cannot get enough fiber from foods.   Drink enough fluids to keep your urine clear or pale yellow.   Exercise regularly or as directed by your health care provider.   Go to the restroom when you have the urge to go. Do not hold it.   Only take over-the-counter or prescription medicines as directed by your health care provider. Do not take other medicines for constipation without talking to your health care provider first.  [place] IF:   You have bright red blood in your  stool.   Your constipation lasts for more than 4 days or gets worse.   You have abdominal or rectal pain.   You have thin, pencil-like stools.   You have unexplained weight loss. MAKE SURE YOU:   Understand these instructions.  Will watch your condition.  Will get help right away if you are not doing well or get worse. Document Released:  06/11/2004 Document Revised: 09/18/2013 Document Reviewed: 06/25/2013 [facility] Patient Information 2015 [place], [place]. This information is not intended to replace advice given to you by your health care provider. Make sure you discuss any questions you have with your health care provider.

## 2014-10-17 NOTE — Assessment & Plan Note (Signed)
Continues to experience constipation. Son instructed on over-the-counter Colace and MiraLAX. If this isn't sufficient to cause a bowel movement consider adding Dulcolax or Senokot to the regimen. Follow up if symptoms worsen or fail to improve.

## 2014-10-17 NOTE — Progress Notes (Signed)
Pre visit review using our clinic review tool, if applicable. No additional management support is needed unless otherwise documented below in the visit note. 

## 2014-10-17 NOTE — Assessment & Plan Note (Signed)
Labile at this point given hospitalization. Patient instructed to contact Dr. Stanford Breed for follow-up appointment with cardiology. Continue current medications as prescribed. Continue fluid restriction to 1200 mL daily.

## 2014-10-17 NOTE — Progress Notes (Signed)
Subjective:    Patient ID: [name], male    DOB: 12-31-1930, 81 y.o.   MRN: [id number]  Chief Complaint  Patient presents with  . Follow-up    does have an appointment for wound care on tomorrow   HPI:  [name] is a 81 y.o. male who presents today for follow up.  Patient was recently seen in the hospital for acute on chronic kidney disease stage IV. His creatinine at the time was 5.93. He was followed by nephrology and declined dialysis. He is also noted to have severe constipation and nausea which was resolved following having a bowel movement. She was started on Colace and MiraLAX. An echocardiogram showed an ejection fraction of 40% and a B natruretic protein of 678. Home health was also ordered for him at this time as he is bedbound secondary to hemorrhagic CVA. Follow up appointment on 1/22. All hospital records were reviewed in detail.  Since hospitalization patient has not had a bowel movement per his son. Indicates he was taking the Colace and ran out because he did not have a refill. He continues to take the MiraLAX.  Son also reports that he is not eating well, however he is eating several small meals and having protein shakes.  No Known Allergies  Current Outpatient Prescriptions on File Prior to Visit  Medication Sig Dispense Refill  . amLODipine (NORVASC) 10 MG tablet Take 10 mg by mouth daily.    Marland Kitchen aspirin 81 MG chewable tablet Chew 1 tablet (81 mg total) by mouth daily. 30 tablet 0  . atorvastatin (LIPITOR) 20 MG tablet take 1 tablet by mouth at bedtime 30 tablet 3  . carvedilol (COREG) 6.25 MG tablet Take 1 tablet (6.25 mg total) by mouth 2 (two) times daily. 180 tablet 1  . docusate sodium 100 MG CAPS Take 100 mg by mouth 2 (two) times daily. 10 capsule 0  . feeding supplement, GLUCERNA SHAKE, (GLUCERNA SHAKE) LIQD Take 237 mLs by mouth 2 (two) times daily between meals.  0  . ferrous sulfate 325 (65 FE) MG tablet Take 1 tablet (325 mg total) by mouth  daily with breakfast. 30 tablet 3  . linagliptin (TRADJENTA) 5 MG TABS tablet Take 1 tablet (5 mg total) by mouth daily. 30 tablet 3  . pantoprazole (PROTONIX) 40 MG tablet Take 1 tablet (40 mg total) by mouth daily. 30 tablet 5  . polyethylene glycol powder (GLYCOLAX/MIRALAX) powder Take 1 Container by mouth daily as needed for mild constipation.     . vitamin C (ASCORBIC ACID) 500 MG tablet Take 500 mg by mouth 2 (two) times daily.    . Vitamin D, Ergocalciferol, (DRISDOL) 50000 UNITS CAPS capsule Take 50,000 Units by mouth every Friday.     . zinc gluconate 50 MG tablet Take 220 mg by mouth daily.      No current facility-administered medications on file prior to visit.     Review of Systems  Constitutional: Positive for appetite change. Negative for fever and chills.  Gastrointestinal: Positive for constipation. Negative for nausea.      Objective:    BP 177/88 mmHg  Pulse 64  Temp(Src) 98.5 F (36.9 C) (Oral)  Resp 18  SpO2 98% Nursing note and vital signs reviewed.  Physical Exam  Constitutional: He is oriented to person, place, and time. He appears well-developed and well-nourished. No distress.  Cardiovascular: Normal rate, regular rhythm, normal heart sounds and intact distal pulses.   Pulmonary/Chest: Effort normal  and breath sounds normal.  Neurological: He is alert and oriented to person, place, and time.  Skin: Skin is warm and dry.  Psychiatric: He has a normal mood and affect. His behavior is normal. Judgment and thought content normal.     Assessment & Plan:

## 2014-10-17 NOTE — Assessment & Plan Note (Signed)
Has appointment with Dr. Posey Pronto. Continue taking medications as prescribed.

## 2014-10-18 ENCOUNTER — Encounter (HOSPITAL_BASED_OUTPATIENT_CLINIC_OR_DEPARTMENT_OTHER): Payer: Medicare HMO | Attending: Internal Medicine

## 2014-10-18 DIAGNOSIS — L97429 Non-pressure chronic ulcer of left heel and midfoot with unspecified severity: Secondary | ICD-10-CM | POA: Insufficient documentation

## 2014-10-18 DIAGNOSIS — L97419 Non-pressure chronic ulcer of right heel and midfoot with unspecified severity: Secondary | ICD-10-CM | POA: Insufficient documentation

## 2014-10-18 DIAGNOSIS — E11621 Type 2 diabetes mellitus with foot ulcer: Secondary | ICD-10-CM | POA: Insufficient documentation

## 2014-10-24 DIAGNOSIS — L97419 Non-pressure chronic ulcer of right heel and midfoot with unspecified severity: Secondary | ICD-10-CM | POA: Diagnosis not present

## 2014-10-24 DIAGNOSIS — E11621 Type 2 diabetes mellitus with foot ulcer: Secondary | ICD-10-CM | POA: Diagnosis not present

## 2014-10-24 DIAGNOSIS — L97429 Non-pressure chronic ulcer of left heel and midfoot with unspecified severity: Secondary | ICD-10-CM | POA: Diagnosis not present

## 2014-10-25 ENCOUNTER — Telehealth: Payer: Self-pay | Admitting: *Deleted

## 2014-10-25 DIAGNOSIS — I69391 Dysphagia following cerebral infarction: Secondary | ICD-10-CM

## 2014-10-25 NOTE — Progress Notes (Signed)
[facility]  NAME:  [name], [name] NO.:  [id number]  MEDICAL RECORD NO.:  [id number]      DATE OF BIRTH:  October 01, 1930  PHYSICIAN:  Ricard Dillon, M.D. VISIT DATE:  10/24/2014                                  OFFICE VISIT   FACILITY: [facility]  HISTORY:  [name] is a gentleman who comes in for our review of bilateral heel wounds.  His son is with him who is his primary caregiver.  Apparently, these were present after a hospitalization 5 months ago.  He went to [facility]. He had wound care there.  These largely got better and he was sent home with home health.  He is apparently receiving silver alginate to the heel.  He has pressure relief boots that he wears fairly religiously. He was in hospital again from October 05, 2014, through October 07, 2014. I do not see anything about the wounds on the discharge summary.  PAST MEDICAL HISTORY:  Chronic kidney disease stage 4, congestive heart failure, CVA in September 2015, with left hemiplegia, iron-deficiency anemia, coronary artery disease, hypertension, and type 2 diabetes.  PAST SURGICAL HISTORY:  CABG x3.  CURRENT MEDICATIONS:  Amlodipine 10 daily, ascorbic acid 500 b.i.d., aspirin 81 mg daily, Lipitor 20 daily, Coreg 6.25 b.i.d., Colace 100 b.i.d., ferrous sulfate 325 daily, Tradjenta 5 mg daily, Protonix 40 daily, MiraLax 17 g daily, Bactrim 800/160 b.i.d., zinc gluconate 50 mcg daily.  PHYSICAL EXAMINATION:  VITAL SIGNS:  Temperature 98.2, pulse 50, respirations 16, blood pressure 130/43.  CBG at 124. RESPIRATORY:  Clear air entry bilaterally. CARDIAC:  Heart sounds normal.  No S3.  No signs of congestive heart failure. PERIPHERAL VASCULAR:  He had noncompressible vessels on the right; however, his peripheral pulses I believe were palpable.  WOUND EXAM:  He has only 2 small areas on both heels over the Achilles aspect  bilaterally which are open.  Most of the rest of this is epithelialized.  I suspect at one time, this was a fairly substantial wound area.  I think actually a very good job has been done here already by the combination of the nursing home, home health, and the patient's family.  I suspect these are going to resolve.  IMPRESSION:  Probable pressure ulcerations in the bilateral heels, most of all of this has already healed.  We applied silver collagen under a foam cup with a Kerlix and net wrap.  I think these will heal.  He already has heel protecting boots.  On further discussion with his son, he has a hospital bed with a mattress.           ______________________________ Ricard Dillon, M.D.     MGR/MEDQ  D:  10/24/2014  T:  10/25/2014  Job:  [id number]

## 2014-10-25 NOTE — Telephone Encounter (Signed)
Abigail Butts called asking for an order for a modified barium swallow study.  Patient is only taking in about 2 Glucerna's a day.  Please call son at [phone]

## 2014-10-25 NOTE — Telephone Encounter (Signed)
Put an order for modified barium swallow:

## 2014-10-29 NOTE — Telephone Encounter (Signed)
May repeat MBS

## 2014-10-29 NOTE — Progress Notes (Signed)
HPI: FU pulmonary HTN, CAD, diastolic CHF, renal insuff, MGUS, carotid stenosis. He is s/p CABG in 2006.  LHC 3/13: occluded LAD, mid and dist LAD filled from the SVG, apical LAD 99% (unchanged), dCFX occluded prior to OM2, OM2 filled via the SVG, RCA prox 25%, mid 40% and distal 25%, SVG to Dx was occluded. Medical therapy recommended.  Patient also with pulmonary HTN; V/Q scan 10/13 was negative for pulmonary embolism. ANA and HIV were both negative. Bubble study was negative for obvious shunt. RHC due to his significant pulmonary hypertension: PA 60/23, mean 38, PCWP 16, CO 8.75. Hemodynamics did not appear to be consistent with pericardial constriction. High cardiac output was thought to be inaccurate. Etiology of his pulmonary HTN was unclear. PFTs revealed restrictive lung disease. 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 This was felt secondary to a combination of Coumadin superimposed on elevated blood pressure. Carotid Dopplers December 2015 showed 40-59% bilateral stenosis and follow-up recommended in one year. Last echocardiogram January 2016 showed ejection fraction 40%, mild mitral regurgitation, moderate left atrial enlargement, mild right atrial enlargement and moderate tricuspid regurgitation with severely elevated pulmonary pressure. Admitted January 2016 with congestive heart failure. Diuresed with improvement. Patient also with significant renal insufficiency and has declined dialysis in the past. Since last seen, he has some dyspnea but denies chest pain, palpitations or syncope. He is not eating well. He does have pedal edema. Patient had potassium noted recently of 6.5. Kayexalate called in by nephrology who is managing this issue. Creatinine increased to 11.46. Patient has declined dialysis.   Current Outpatient Prescriptions  Medication Sig Dispense Refill  . amLODipine (NORVASC) 10 MG tablet Take 10 mg by mouth daily.    Marland Kitchen aspirin 81  MG chewable tablet Chew 1 tablet (81 mg total) by mouth daily. 30 tablet 0  . atorvastatin (LIPITOR) 20 MG tablet take 1 tablet by mouth at bedtime 30 tablet 3  . carvedilol (COREG) 6.25 MG tablet Take 1 tablet (6.25 mg total) by mouth 2 (two) times daily. 180 tablet 1  . docusate sodium 100 MG CAPS Take 100 mg by mouth 2 (two) times daily. 10 capsule 0  . feeding supplement, GLUCERNA SHAKE, (GLUCERNA SHAKE) LIQD Take 237 mLs by mouth 2 (two) times daily between meals.  0  . ferrous sulfate 325 (65 FE) MG tablet Take 1 tablet (325 mg total) by mouth daily with breakfast. 30 tablet 3  . furosemide (LASIX) 80 MG tablet Take 1 tablet by mouth daily.    Marland Kitchen KIONEX 15 GM/60ML suspension Take 15 g by mouth once.     . linagliptin (TRADJENTA) 5 MG TABS tablet Take 1 tablet (5 mg total) by mouth daily. 30 tablet 3  . megestrol (MEGACE) 40 MG/ML suspension     . pantoprazole (PROTONIX) 40 MG tablet Take 1 tablet (40 mg total) by mouth daily. 30 tablet 5  . polyethylene glycol powder (GLYCOLAX/MIRALAX) powder Take 1 Container by mouth daily as needed for mild constipation.     . sulfamethoxazole-trimethoprim (BACTRIM DS,SEPTRA DS) 800-160 MG per tablet Take 1 tablet by mouth 2 (two) times daily.    . vitamin C (ASCORBIC ACID) 500 MG tablet Take 500 mg by mouth 2 (two) times daily.    . Vitamin D, Ergocalciferol, (DRISDOL) 50000 UNITS CAPS capsule Take 50,000 Units by mouth every Friday.     . zinc gluconate 50 MG tablet Take 220 mg by mouth daily.  No current facility-administered medications for this visit.     Past Medical History  Diagnosis Date  . CAD (coronary artery disease)     s/p 3v CABG 2006, myoview 04/2010 EF 49%, prior inferior/apical infarct, no ischemia, LHC 11/2011 stable anatomy (occluded LAD filled from vein graft, distal LCx occluded prior to OM2, OM2 filled from vein graft, RCA prox 25%, mid 40%, distal 25% lesions, SVG to diagonal occluded) Med Rx  . Chronic diastolic heart  failure     hx of cardiorenal syndrome;  Echo 06/2012 EF 86-76%, mod diastolic dysfunction, mild MR, mod TR, mild R/LAE, mild RV dilatation, PASP 27mHg.  . Carotid stenosis     a. Carotid dopplers R 40-59%, Left 60-79%;   b. carotids 12/13:  07-20%RICA, 694-70%LICA (rpt in 6 mos);  c.  Carotid [place](11/14):  R 40-59%; L 60-79%, R vertebral occluded, L vertebral antegrade  . HLD (hyperlipidemia)   . Mediastinal adenopathy     per CT chest in 2006 with PET scan showing very limited metabolic activity; not felt to have a significant neoplastic potential  . Iron deficiency anemia   . Hypertension   . Diabetes mellitus     insulin dependent   . GERD (gastroesophageal reflux disease)   . AV block, Mobitz 1     Noted 11/2011 in hospital, BB stopped  . Colon polyps   . Diverticulosis   . Pulmonary arterial hypertension     a.  RHC 11/1 PASP 60 mmHg (mean 38); b. RHC 11/15 PA pressure 66/24, PCWP 26 and CO 6.2 => Sildenafil started;   c. Echo bubble study 11/13:  no obvious shunt;   d. VQ scan neg for pulmonary embolism  . CKD (chronic kidney disease) stage 5, GFR less than 15 ml/min     hx of cardiorenal syndrome;  Dr PPosey Pronto  AVF recommended and scheduled (patient hesitant to proceed);   Renal [place]11/13: diff echogenic kidneys c/w medical renal disease; no hydronephrosis or renal mass  . Hyperparathyroidism, secondary renal   . MGUS (monoclonal gammopathy of unknown significance)     a. Kappa/Lambda free light chain ratio 11/13:  23.99;  b. Met. Bone survey 11/13:  no osteolytic lesions to suggest MMyeloma  . Atrial flutter     diagnosed 05/2013; Coumadin initiated    Past Surgical History  Procedure Laterality Date  . Appendectomy    . Coronary artery bypass graft  2006    SVG to OM2, SVG to LAD, SVG to DX; (the LIMA did not have good flow and therefore was not used)  . Cardiac catheterization  12/21/2011  . Left heart catheterization with coronary/graft angiogram N/A 12/21/2011    Procedure:  LEFT HEART CATHETERIZATION WITH CBeatrix Fetters  Surgeon: CBurnell Blanks MD;  [facility];  Service: Cardiovascular;  Laterality: N/A;  . Right heart catheterization Bilateral 07/28/2012    Procedure: RIGHT HEART CATH;  Surgeon: DLarey Dresser MD;  [facility];  Service: Cardiovascular;  Laterality: Bilateral;  . Right heart catheterization N/A 08/11/2012    Procedure: RIGHT HEART CATH;  Surgeon: PThayer Headings MD;  [facility];  Service: Cardiovascular;  Laterality: N/A;    History   Social History  . Marital Status: Widowed    Spouse Name: N/A    Number of Children: 160 . Years of Education: N/A   Occupational History  . Retired    Social History Main Topics  . Smoking status: Former  Smoker    Quit date: 09/27/1953  . Smokeless tobacco: Never Used  . Alcohol Use: No  . Drug Use: No  . Sexual Activity: No   Other Topics Concern  . Not on file   Social History Narrative   10th grade. Married '64-10 yrs/widowed; married '77 - 73yr/widowed. 7 sons, 5 daughters; 30+ grandchildren, 15 great-grands. 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 His son checks on him all the time.     ROS: no fevers or chills, productive cough, hemoptysis, dysphasia, odynophagia, melena, hematochezia, dysuria, hematuria, rash, seizure activity, orthopnea, PND, claudication. Remaining systems are negative.  Physical Exam: Well-developed frail, chronically ill appearing in no acute distress.  Skin is warm and dry.  HEENT is normal.  Neck is supple.  Chest is clear to auscultation with normal expansion.  Cardiovascular exam is irregular Abdominal exam nontender or distended. No masses palpated. Extremities show 1+ edema.   ECG electrocardiogram 10/10/2014 showed atrial fibrillation, IVCD, left axis deviation, nonspecific T-wave changes.

## 2014-10-30 ENCOUNTER — Ambulatory Visit (HOSPITAL_COMMUNITY)
Admission: RE | Admit: 2014-10-30 | Discharge: 2014-10-30 | Disposition: A | Discharge status: Home / Self Care | Payer: Medicare HMO | Source: Ambulatory Visit | Attending: Nephrology | Admitting: Nephrology

## 2014-10-30 DIAGNOSIS — I2510 Atherosclerotic heart disease of native coronary artery without angina pectoris: Secondary | ICD-10-CM

## 2014-10-30 DIAGNOSIS — N184 Chronic kidney disease, stage 4 (severe): Secondary | ICD-10-CM | POA: Insufficient documentation

## 2014-10-30 DIAGNOSIS — Z4800 Encounter for change or removal of nonsurgical wound dressing: Secondary | ICD-10-CM

## 2014-10-30 DIAGNOSIS — D631 Anemia in chronic kidney disease: Secondary | ICD-10-CM | POA: Diagnosis not present

## 2014-10-30 DIAGNOSIS — N185 Chronic kidney disease, stage 5: Secondary | ICD-10-CM | POA: Diagnosis not present

## 2014-10-30 DIAGNOSIS — I132 Hypertensive heart and chronic kidney disease with heart failure and with stage 5 chronic kidney disease, or end stage renal disease: Secondary | ICD-10-CM | POA: Diagnosis not present

## 2014-10-30 DIAGNOSIS — L89623 Pressure ulcer of left heel, stage 3: Secondary | ICD-10-CM

## 2014-10-30 DIAGNOSIS — I5042 Chronic combined systolic (congestive) and diastolic (congestive) heart failure: Secondary | ICD-10-CM | POA: Diagnosis not present

## 2014-10-30 DIAGNOSIS — I69254 Hemiplegia and hemiparesis following other nontraumatic intracranial hemorrhage affecting left non-dominant side: Secondary | ICD-10-CM | POA: Diagnosis not present

## 2014-10-30 DIAGNOSIS — L89613 Pressure ulcer of right heel, stage 3: Secondary | ICD-10-CM

## 2014-10-30 DIAGNOSIS — E1140 Type 2 diabetes mellitus with diabetic neuropathy, unspecified: Secondary | ICD-10-CM

## 2014-10-30 LAB — POCT HEMOGLOBIN-HEMACUE: Hemoglobin: 8.5 g/dL — ABNORMAL LOW (ref 13.0–17.0)

## 2014-10-30 LAB — RENAL FUNCTION PANEL
Albumin: 3.1 g/dL — ABNORMAL LOW (ref 3.5–5.2)
Anion gap: 11 (ref 5–15)
BUN: 79 mg/dL — AB (ref 6–23)
CHLORIDE: 106 mmol/L (ref 96–112)
CO2: 16 mmol/L — ABNORMAL LOW (ref 19–32)
Calcium: 9.4 mg/dL (ref 8.4–10.5)
Creatinine, Ser: 11.46 mg/dL — ABNORMAL HIGH (ref 0.50–1.35)
GFR calc Af Amer: 4 mL/min — ABNORMAL LOW (ref >90)
GFR calc non Af Amer: 4 mL/min — ABNORMAL LOW (ref >90)
Glucose, Bld: 94 mg/dL (ref 70–99)
Phosphorus: 5.6 mg/dL — ABNORMAL HIGH (ref 2.3–4.6)
Potassium: 6.5 mmol/L (ref 3.5–5.1)
Sodium: 133 mmol/L — ABNORMAL LOW (ref 135–145)

## 2014-10-30 LAB — MAGNESIUM: MAGNESIUM: 2.7 mg/dL — AB (ref 1.5–2.5)

## 2014-10-30 MED ORDER — EPOETIN ALFA 10000 UNIT/ML IJ SOLN
INTRAMUSCULAR | Status: AC
  Filled 2014-10-30: qty 1

## 2014-10-30 MED ORDER — EPOETIN ALFA 10000 UNIT/ML IJ SOLN
10000.0000 [IU] | INTRAMUSCULAR | Status: DC
  Administered 2014-10-30: 10000 [IU] via SUBCUTANEOUS

## 2014-10-30 NOTE — Progress Notes (Signed)
Critical K+ level of 6.5 called to Crystal at Dr Serita Grit office.  She is to pass message to Alinda Sierras. Pt had already left the facility.

## 2014-10-31 ENCOUNTER — Ambulatory Visit (INDEPENDENT_AMBULATORY_CARE_PROVIDER_SITE_OTHER): Payer: Medicare HMO | Admitting: Cardiology

## 2014-10-31 ENCOUNTER — Encounter: Payer: Self-pay | Admitting: Cardiology

## 2014-10-31 VITALS — BP 120/62 | HR 55

## 2014-10-31 DIAGNOSIS — I5032 Chronic diastolic (congestive) heart failure: Secondary | ICD-10-CM

## 2014-10-31 DIAGNOSIS — N189 Chronic kidney disease, unspecified: Secondary | ICD-10-CM

## 2014-10-31 DIAGNOSIS — I10 Essential (primary) hypertension: Secondary | ICD-10-CM

## 2014-10-31 DIAGNOSIS — I482 Chronic atrial fibrillation, unspecified: Secondary | ICD-10-CM

## 2014-10-31 DIAGNOSIS — E785 Hyperlipidemia, unspecified: Secondary | ICD-10-CM

## 2014-10-31 DIAGNOSIS — N179 Acute kidney failure, unspecified: Secondary | ICD-10-CM

## 2014-10-31 DIAGNOSIS — I2510 Atherosclerotic heart disease of native coronary artery without angina pectoris: Secondary | ICD-10-CM

## 2014-10-31 LAB — IRON AND TIBC
Iron: 63 ug/dL (ref 42–165)
Saturation Ratios: 41 % (ref 20–55)
TIBC: 153 ug/dL — ABNORMAL LOW (ref 215–435)
UIBC: 90 ug/dL — ABNORMAL LOW (ref 125–400)

## 2014-10-31 LAB — FERRITIN: FERRITIN: 1068 ng/mL — AB (ref 22–322)

## 2014-10-31 NOTE — Assessment & Plan Note (Signed)
Continue aspirin and statin. 

## 2014-10-31 NOTE — Assessment & Plan Note (Addendum)
This is being managed by nephrology.patient has refused dialysis. He understands this will take his life. Patient's prognosis is extremely poor.

## 2014-10-31 NOTE — Assessment & Plan Note (Signed)
Continue statin. 

## 2014-10-31 NOTE — Assessment & Plan Note (Addendum)
Continue present dose of Lasix. He is volume overloaded on examination. However his creatinine is now 11. I cannot advance his diuretic. I initiated CODE STATUS discussions. His prognosis is poor.

## 2014-10-31 NOTE — Assessment & Plan Note (Signed)
Patient remains in permanent atrial fibrillation. Continue carvedilol for rate control. Continue aspirin. No Coumadin given history of intracranial hemorrhage.

## 2014-10-31 NOTE — Patient Instructions (Signed)
Your physician recommends that you schedule a follow-up appointment in: 3 MONTHS WITH DR CRENSHAW  

## 2014-10-31 NOTE — Assessment & Plan Note (Signed)
Continue present medications. 

## 2014-11-01 ENCOUNTER — Encounter (HOSPITAL_BASED_OUTPATIENT_CLINIC_OR_DEPARTMENT_OTHER): Payer: Medicare HMO | Attending: Internal Medicine

## 2014-11-01 DIAGNOSIS — L89622 Pressure ulcer of left heel, stage 2: Secondary | ICD-10-CM | POA: Insufficient documentation

## 2014-11-01 DIAGNOSIS — L89612 Pressure ulcer of right heel, stage 2: Secondary | ICD-10-CM | POA: Diagnosis not present

## 2014-11-03 IMAGING — CR DG CHEST 1V PORT
1 series · 1 of 1 positions shown · non-contrast
Comparison: 08/07/2012.

CLINICAL DATA: Altered mental status.

EXAM:
PORTABLE CHEST - 1 VIEW

[AP]
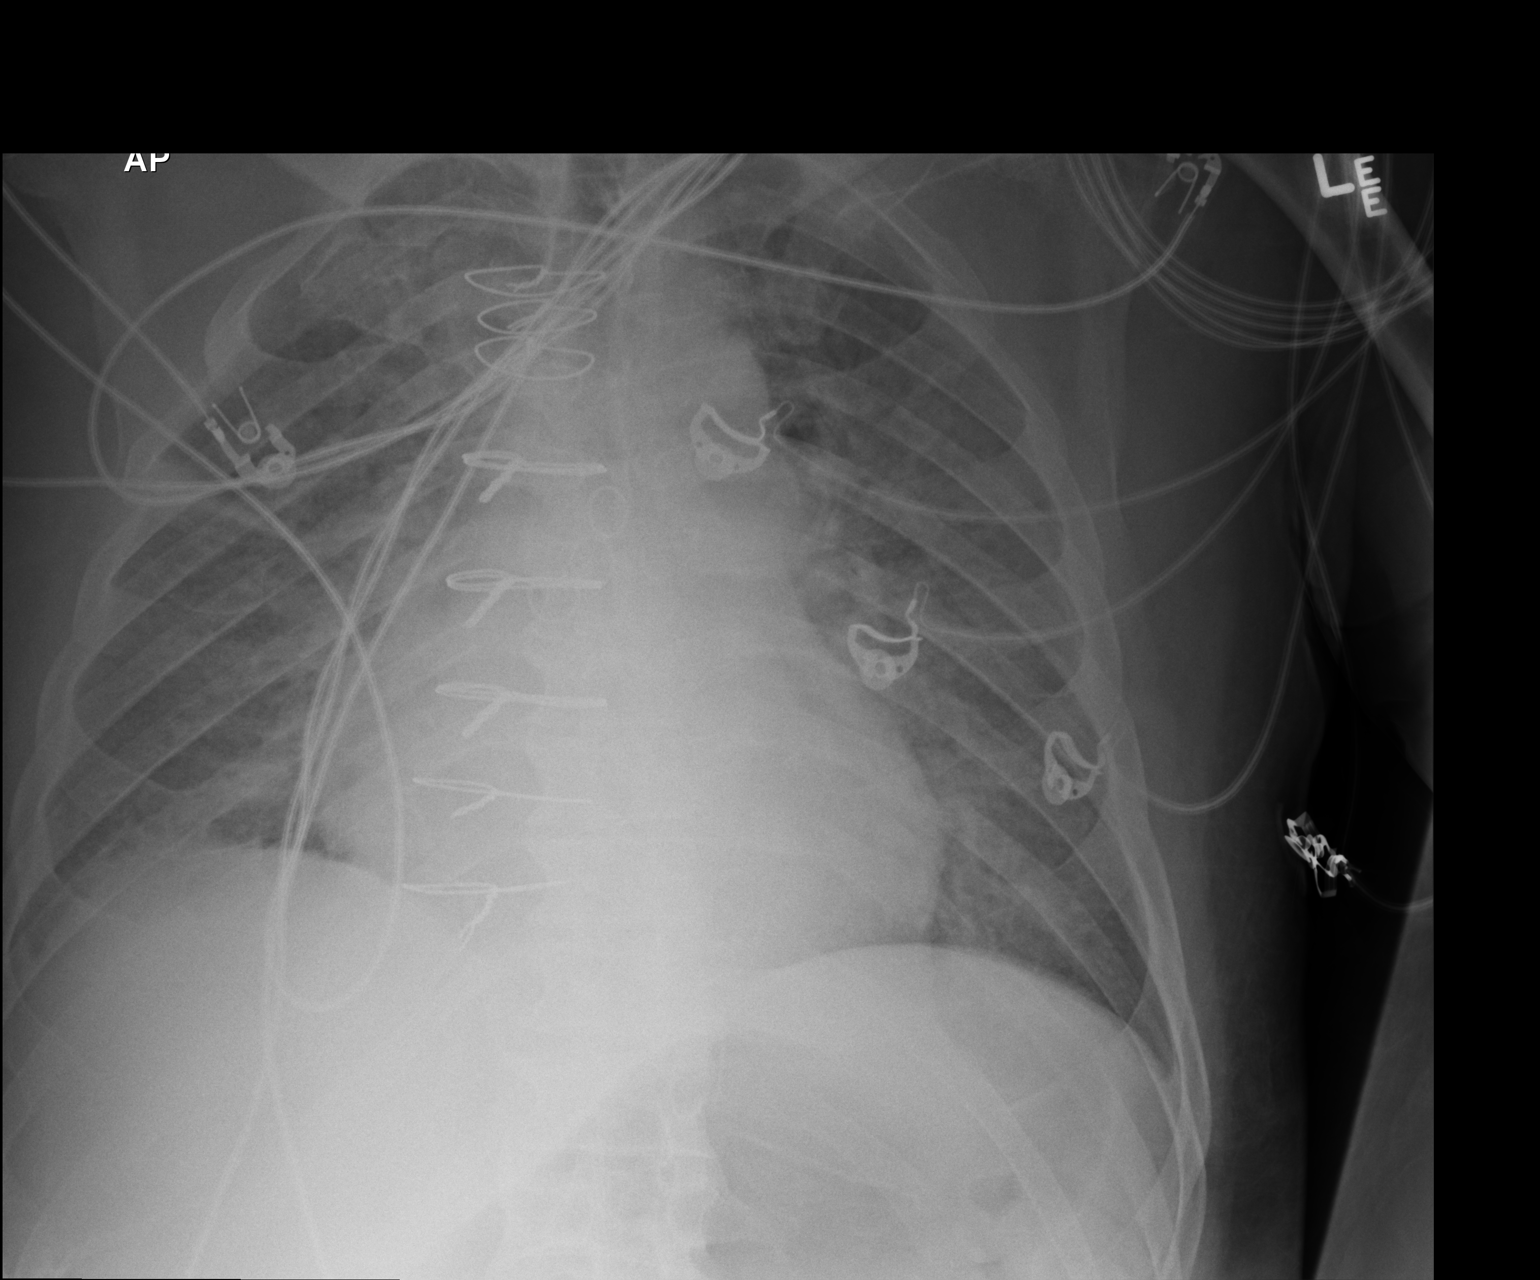

[1 of 1 positions shown; findings below may reference images not displayed]

FINDINGS: The heart is mildly enlarged but stable. Stable surgical changes
from triple bypass surgery. There is vascular congestion and mild
interstitial it suggesting CHF. No pleural effusion.
IMPRESSION: Mild CHF.

## 2014-11-08 DIAGNOSIS — L89612 Pressure ulcer of right heel, stage 2: Secondary | ICD-10-CM | POA: Diagnosis not present

## 2014-11-08 DIAGNOSIS — L89622 Pressure ulcer of left heel, stage 2: Secondary | ICD-10-CM | POA: Diagnosis not present

## 2014-11-12 ENCOUNTER — Encounter (HOSPITAL_COMMUNITY): Payer: Self-pay | Admitting: Emergency Medicine

## 2014-11-12 ENCOUNTER — Emergency Department (HOSPITAL_COMMUNITY): Payer: Medicare HMO

## 2014-11-12 ENCOUNTER — Inpatient Hospital Stay (HOSPITAL_COMMUNITY)
Admission: EM | Admit: 2014-11-12 | Discharge: 2014-11-15 | DRG: 682 | Disposition: A | Discharge status: Home / Self Care | Payer: Medicare HMO | Attending: Internal Medicine | Admitting: Internal Medicine

## 2014-11-12 DIAGNOSIS — Z992 Dependence on renal dialysis: Secondary | ICD-10-CM

## 2014-11-12 DIAGNOSIS — E8779 Other fluid overload: Secondary | ICD-10-CM

## 2014-11-12 DIAGNOSIS — N186 End stage renal disease: Secondary | ICD-10-CM | POA: Diagnosis present

## 2014-11-12 DIAGNOSIS — E785 Hyperlipidemia, unspecified: Secondary | ICD-10-CM | POA: Diagnosis present

## 2014-11-12 DIAGNOSIS — I2510 Atherosclerotic heart disease of native coronary artery without angina pectoris: Secondary | ICD-10-CM | POA: Diagnosis present

## 2014-11-12 DIAGNOSIS — E119 Type 2 diabetes mellitus without complications: Secondary | ICD-10-CM | POA: Diagnosis present

## 2014-11-12 DIAGNOSIS — I214 Non-ST elevation (NSTEMI) myocardial infarction: Secondary | ICD-10-CM

## 2014-11-12 DIAGNOSIS — Z87891 Personal history of nicotine dependence: Secondary | ICD-10-CM | POA: Diagnosis not present

## 2014-11-12 DIAGNOSIS — I69351 Hemiplegia and hemiparesis following cerebral infarction affecting right dominant side: Secondary | ICD-10-CM

## 2014-11-12 DIAGNOSIS — L89620 Pressure ulcer of left heel, unstageable: Secondary | ICD-10-CM | POA: Diagnosis present

## 2014-11-12 DIAGNOSIS — K5900 Constipation, unspecified: Secondary | ICD-10-CM | POA: Diagnosis present

## 2014-11-12 DIAGNOSIS — Z515 Encounter for palliative care: Secondary | ICD-10-CM

## 2014-11-12 DIAGNOSIS — E875 Hyperkalemia: Secondary | ICD-10-CM | POA: Diagnosis present

## 2014-11-12 DIAGNOSIS — I5032 Chronic diastolic (congestive) heart failure: Secondary | ICD-10-CM | POA: Diagnosis present

## 2014-11-12 DIAGNOSIS — K219 Gastro-esophageal reflux disease without esophagitis: Secondary | ICD-10-CM | POA: Diagnosis present

## 2014-11-12 DIAGNOSIS — R001 Bradycardia, unspecified: Secondary | ICD-10-CM | POA: Diagnosis present

## 2014-11-12 DIAGNOSIS — J9601 Acute respiratory failure with hypoxia: Secondary | ICD-10-CM | POA: Diagnosis present

## 2014-11-12 DIAGNOSIS — Z86010 Personal history of colonic polyps: Secondary | ICD-10-CM

## 2014-11-12 DIAGNOSIS — I272 Other secondary pulmonary hypertension: Secondary | ICD-10-CM | POA: Diagnosis present

## 2014-11-12 DIAGNOSIS — I120 Hypertensive chronic kidney disease with stage 5 chronic kidney disease or end stage renal disease: Secondary | ICD-10-CM | POA: Diagnosis present

## 2014-11-12 DIAGNOSIS — I130 Hypertensive heart and chronic kidney disease with heart failure and stage 1 through stage 4 chronic kidney disease, or unspecified chronic kidney disease: Secondary | ICD-10-CM | POA: Diagnosis present

## 2014-11-12 DIAGNOSIS — Z66 Do not resuscitate: Secondary | ICD-10-CM | POA: Diagnosis present

## 2014-11-12 DIAGNOSIS — I10 Essential (primary) hypertension: Secondary | ICD-10-CM

## 2014-11-12 DIAGNOSIS — J811 Chronic pulmonary edema: Secondary | ICD-10-CM | POA: Diagnosis present

## 2014-11-12 DIAGNOSIS — I6529 Occlusion and stenosis of unspecified carotid artery: Secondary | ICD-10-CM | POA: Diagnosis present

## 2014-11-12 DIAGNOSIS — N179 Acute kidney failure, unspecified: Principal | ICD-10-CM | POA: Diagnosis present

## 2014-11-12 DIAGNOSIS — T68XXXA Hypothermia, initial encounter: Secondary | ICD-10-CM

## 2014-11-12 DIAGNOSIS — N189 Chronic kidney disease, unspecified: Secondary | ICD-10-CM

## 2014-11-12 DIAGNOSIS — D509 Iron deficiency anemia, unspecified: Secondary | ICD-10-CM | POA: Diagnosis present

## 2014-11-12 DIAGNOSIS — Z951 Presence of aortocoronary bypass graft: Secondary | ICD-10-CM | POA: Diagnosis not present

## 2014-11-12 DIAGNOSIS — R0602 Shortness of breath: Secondary | ICD-10-CM | POA: Diagnosis present

## 2014-11-12 DIAGNOSIS — N2581 Secondary hyperparathyroidism of renal origin: Secondary | ICD-10-CM | POA: Diagnosis present

## 2014-11-12 DIAGNOSIS — J810 Acute pulmonary edema: Secondary | ICD-10-CM

## 2014-11-12 LAB — COMPREHENSIVE METABOLIC PANEL
ALBUMIN: 3 g/dL — AB (ref 3.5–5.2)
ALT: 11 U/L (ref 0–53)
AST: 45 U/L — ABNORMAL HIGH (ref 0–37)
Alkaline Phosphatase: 75 U/L (ref 39–117)
Anion gap: 15 (ref 5–15)
BILIRUBIN TOTAL: 0.6 mg/dL (ref 0.3–1.2)
BUN: 122 mg/dL — AB (ref 6–23)
CALCIUM: 9.3 mg/dL (ref 8.4–10.5)
CO2: 12 mmol/L — ABNORMAL LOW (ref 19–32)
CREATININE: 17.23 mg/dL — AB (ref 0.50–1.35)
Chloride: 107 mmol/L (ref 96–112)
GFR calc Af Amer: 3 mL/min — ABNORMAL LOW (ref >90)
GFR calc non Af Amer: 2 mL/min — ABNORMAL LOW (ref >90)
Glucose, Bld: 116 mg/dL — ABNORMAL HIGH (ref 70–99)
Potassium: 5.7 mmol/L — ABNORMAL HIGH (ref 3.5–5.1)
Sodium: 134 mmol/L — ABNORMAL LOW (ref 135–145)
Total Protein: 6.2 g/dL (ref 6.0–8.3)

## 2014-11-12 LAB — CBC WITH DIFFERENTIAL/PLATELET
Basophils Absolute: 0 10*3/uL (ref 0.0–0.1)
Basophils Relative: 0 % (ref 0–1)
Eosinophils Absolute: 0 10*3/uL (ref 0.0–0.7)
Eosinophils Relative: 1 % (ref 0–5)
HCT: 25.8 % — ABNORMAL LOW (ref 39.0–52.0)
Hemoglobin: 8.6 g/dL — ABNORMAL LOW (ref 13.0–17.0)
LYMPHS PCT: 15 % (ref 12–46)
Lymphs Abs: 0.6 10*3/uL — ABNORMAL LOW (ref 0.7–4.0)
MCH: 27.8 pg (ref 26.0–34.0)
MCHC: 33.3 g/dL (ref 30.0–36.0)
MCV: 83.5 fL (ref 78.0–100.0)
Monocytes Absolute: 0.3 10*3/uL (ref 0.1–1.0)
Monocytes Relative: 8 % (ref 3–12)
Neutro Abs: 2.8 10*3/uL (ref 1.7–7.7)
Neutrophils Relative %: 76 % (ref 43–77)
PLATELETS: 87 10*3/uL — AB (ref 150–400)
RBC: 3.09 MIL/uL — AB (ref 4.22–5.81)
RDW: 19.6 % — ABNORMAL HIGH (ref 11.5–15.5)
WBC: 3.7 10*3/uL — ABNORMAL LOW (ref 4.0–10.5)

## 2014-11-12 LAB — I-STAT CHEM 8, ED
BUN: 101 mg/dL — ABNORMAL HIGH (ref 6–23)
Calcium, Ion: 1.19 mmol/L (ref 1.13–1.30)
Chloride: 111 mmol/L (ref 96–112)
Glucose, Bld: 109 mg/dL — ABNORMAL HIGH (ref 70–99)
HEMATOCRIT: 29 % — AB (ref 39.0–52.0)
HEMOGLOBIN: 9.9 g/dL — AB (ref 13.0–17.0)
POTASSIUM: 5.7 mmol/L — AB (ref 3.5–5.1)
Sodium: 134 mmol/L — ABNORMAL LOW (ref 135–145)
TCO2: 9 mmol/L (ref 0–100)

## 2014-11-12 LAB — GLUCOSE, CAPILLARY: Glucose-Capillary: 93 mg/dL (ref 70–99)

## 2014-11-12 LAB — I-STAT TROPONIN, ED: TROPONIN I, POC: 7.38 ng/mL — AB (ref 0.00–0.08)

## 2014-11-12 LAB — I-STAT CG4 LACTIC ACID, ED: LACTIC ACID, VENOUS: 0.84 mmol/L (ref 0.5–2.0)

## 2014-11-12 LAB — MAGNESIUM: MAGNESIUM: 2.9 mg/dL — AB (ref 1.5–2.5)

## 2014-11-12 LAB — BRAIN NATRIURETIC PEPTIDE: B NATRIURETIC PEPTIDE 5: 3915.8 pg/mL — AB (ref 0.0–100.0)

## 2014-11-12 LAB — PHOSPHORUS: PHOSPHORUS: 7.7 mg/dL — AB (ref 2.3–4.6)

## 2014-11-12 MED ORDER — SODIUM CHLORIDE 0.9 % IV SOLN
1.0000 g | Freq: Once | INTRAVENOUS | Status: AC
  Administered 2014-11-12: 1 g via INTRAVENOUS
  Filled 2014-11-12: qty 10

## 2014-11-12 MED ORDER — VANCOMYCIN HCL IN DEXTROSE 750-5 MG/150ML-% IV SOLN
750.0000 mg | Freq: Once | INTRAVENOUS | Status: AC
  Administered 2014-11-12: 750 mg via INTRAVENOUS
  Filled 2014-11-12: qty 150

## 2014-11-12 MED ORDER — PIPERACILLIN-TAZOBACTAM IN DEX 2-0.25 GM/50ML IV SOLN
2.2500 g | Freq: Three times a day (TID) | INTRAVENOUS | Status: DC
  Administered 2014-11-13 (×2): 2.25 g via INTRAVENOUS
  Filled 2014-11-12 (×6): qty 50

## 2014-11-12 MED ORDER — ONDANSETRON HCL 4 MG/2ML IJ SOLN: 4.0000 mg | Freq: Four times a day (QID) | INTRAMUSCULAR | Status: DC | PRN

## 2014-11-12 MED ORDER — PIPERACILLIN-TAZOBACTAM 3.375 G IVPB 30 MIN
3.3750 g | Freq: Once | INTRAVENOUS | Status: AC
  Administered 2014-11-12: 3.375 g via INTRAVENOUS
  Filled 2014-11-12: qty 50

## 2014-11-12 MED ORDER — MORPHINE SULFATE 2 MG/ML IJ SOLN: 1.0000 mg | INTRAMUSCULAR | Status: DC | PRN

## 2014-11-12 MED ORDER — SODIUM CHLORIDE 0.9 % IV BOLUS (SEPSIS)
250.0000 mL | Freq: Once | INTRAVENOUS | Status: AC
  Administered 2014-11-12: 250 mL via INTRAVENOUS

## 2014-11-12 MED ORDER — SODIUM CHLORIDE 0.9 % IJ SOLN
3.0000 mL | Freq: Two times a day (BID) | INTRAMUSCULAR | Status: DC
  Administered 2014-11-13 (×2): 3 mL via INTRAVENOUS

## 2014-11-12 MED ORDER — SODIUM POLYSTYRENE SULFONATE 15 GM/60ML PO SUSP
30.0000 g | Freq: Once | ORAL | Status: AC
  Administered 2014-11-12: 30 g via ORAL
  Filled 2014-11-12: qty 120

## 2014-11-12 MED ORDER — SODIUM CHLORIDE 0.9 % IJ SOLN: 3.0000 mL | INTRAMUSCULAR | Status: DC | PRN

## 2014-11-12 MED ORDER — SODIUM CHLORIDE 0.9 % IV SOLN: 250.0000 mL | INTRAVENOUS | Status: DC | PRN

## 2014-11-12 MED ORDER — VANCOMYCIN HCL IN DEXTROSE 1-5 GM/200ML-% IV SOLN
1000.0000 mg | Freq: Once | INTRAVENOUS | Status: AC
  Administered 2014-11-12: 1000 mg via INTRAVENOUS
  Filled 2014-11-12: qty 200

## 2014-11-12 MED ORDER — ONDANSETRON HCL 4 MG PO TABS: 4.0000 mg | ORAL_TABLET | Freq: Four times a day (QID) | ORAL | Status: DC | PRN

## 2014-11-12 NOTE — H&P (Signed)
History and Physical  [name] JTT:[id number] DOB: 10-07-1930 DOA: 11/12/2014  Referring physician: Sinda Cooke, ER physician PCP: Parker Po, FNP   Chief Complaint: Shortness of breath  HPI: [name] is a 81 y.o. male  Past mental history of CAD, chronic diastolic heart failure, diabetes mellitus and stage V chronic kidney disease. In the past, patient has not been interested in dialysis. He is brought in by his son who cares for him complaining of shortness of breath. The emergency room, he was found be hypothermic and hypotensive as well as bradycardic. Labs were done noting a potassium of 5.7, BUN of 122 and creatinine of 17 and a troponin of 7.4.. Patient was still awake and alert and appropriate. He confirms to the ER doctor and to myself that he did not want dialysis. Bladder scan done noting no signs of obstruction to this was consistent with end-stage renal disease. This was discussed with the patient and with his son and the plan will be for making the patient comfortable. Hospitalists were called for admission.   Review of Systems:  Patient seen in the emergency room. Pt complains of feeling very tired and weak. He has no appetite  Pt denies any headaches, dysphagia, chest pain, shortness of breath, abdominal pain.  Review of systems are otherwise negative  Past Medical History  Diagnosis Date  . CAD (coronary artery disease)     s/p 3v CABG 2006, myoview 04/2010 EF 49%, prior inferior/apical infarct, no ischemia, LHC 11/2011 stable anatomy (occluded LAD filled from vein graft, distal LCx occluded prior to OM2, OM2 filled from vein graft, RCA prox 25%, mid 40%, distal 25% lesions, SVG to diagonal occluded) Med Rx  . Chronic diastolic heart failure     hx of cardiorenal syndrome;  Echo 06/2012 EF 00-92%, mod diastolic dysfunction, mild MR, mod TR, mild R/LAE, mild RV dilatation, PASP 41mmHg.  . Carotid stenosis     a. Carotid dopplers R 40-59%, Left 60-79%;   b.  carotids 12/13:  3-30% RICA, 07-62% LICA (rpt in 6 mos);  c.  Carotid US (11/14):  R 40-59%; L 60-79%, R vertebral occluded, L vertebral antegrade  . HLD (hyperlipidemia)   . Mediastinal adenopathy     per CT chest in 2006 with PET scan showing very limited metabolic activity; not felt to have a significant neoplastic potential  . Iron deficiency anemia   . Hypertension   . Diabetes mellitus     insulin dependent   . GERD (gastroesophageal reflux disease)   . AV block, Mobitz 1     Noted 11/2011 in hospital, BB stopped  . Colon polyps   . Diverticulosis   . Pulmonary arterial hypertension     a.  RHC 11/1 PASP 60 mmHg (mean 38); b. RHC 11/15 PA pressure 66/24, PCWP 26 and CO 6.2 => Sildenafil started;   c. Echo bubble study 11/13:  no obvious shunt;   d. VQ scan neg for pulmonary embolism  . CKD (chronic kidney disease) stage 5, GFR less than 15 ml/min     hx of cardiorenal syndrome;  Dr Posey Pronto;  AVF recommended and scheduled (patient hesitant to proceed);   Renal US 11/13: diff echogenic kidneys c/w medical renal disease; no hydronephrosis or renal mass  . Hyperparathyroidism, secondary renal   . MGUS (monoclonal gammopathy of unknown significance)     a. Kappa/Lambda free light chain ratio 11/13:  23.99;  b. Met. Bone survey 11/13:  no osteolytic lesions to suggest MMyeloma  .  Atrial flutter     diagnosed 05/2013; Coumadin initiated   Past Surgical History  Procedure Laterality Date  . Appendectomy    . Coronary artery bypass graft  2006    SVG to OM2, SVG to LAD, SVG to DX; (the LIMA did not have good flow and therefore was not used)  . Cardiac catheterization  12/21/2011  . Left heart catheterization with coronary/graft angiogram N/A 12/21/2011    Procedure: LEFT HEART CATHETERIZATION WITH Beatrix Fetters;  Surgeon: Burnell Blanks, MD;  Location[facility] LAB;  Service: Cardiovascular;  Laterality: N/A;  . Right heart catheterization Bilateral 07/28/2012    Procedure:  RIGHT HEART CATH;  Surgeon: Larey Dresser, MD;  Location[facility];  Service: Cardiovascular;  Laterality: Bilateral;  . Right heart catheterization N/A 08/11/2012    Procedure: RIGHT HEART CATH;  Surgeon: Thayer Headings, MD;  Location: [facility] CATH LAB;  Service: Cardiovascular;  Laterality: N/A;   Social History:  reports that he quit smoking about 61 years ago. He has never used smokeless tobacco. He reports that he does not drink alcohol or use illicit drugs. Patient was at home with son. He has been more and more weaker and has minimal ambulation  No Known Allergies  Family History  Problem Relation Age of Onset  . Stomach cancer Sister   . Pancreatic cancer Brother   . Lung cancer Brother   . Colon cancer Neg Hx       Prior to Admission medications   Medication Sig Start Date End Date Taking? Authorizing Provider  amLODipine (NORVASC) 10 MG tablet Take 10 mg by mouth daily.   Yes Historical Provider, MD  aspirin 81 MG chewable tablet Chew 1 tablet (81 mg total) by mouth daily. 10/07/14  Yes Kelvin Cellar, MD  atorvastatin (LIPITOR) 20 MG tablet take 1 tablet by mouth at bedtime 09/14/13  Yes Lelon Perla, MD  carvedilol (COREG) 6.25 MG tablet Take 1 tablet (6.25 mg total) by mouth 2 (two) times daily. 09/05/14  Yes Parker Po, FNP  ferrous sulfate 325 (65 FE) MG tablet Take 1 tablet (325 mg total) by mouth daily with breakfast. 10/07/14  Yes Kelvin Cellar, MD  furosemide (LASIX) 80 MG tablet Take 80 mg by mouth daily.  10/29/14  Yes Historical Provider, MD  linagliptin (TRADJENTA) 5 MG TABS tablet Take 1 tablet (5 mg total) by mouth daily. 09/30/14  Yes Parker Po, FNP  megestrol (MEGACE) 40 MG/ML suspension Take 400 mg by mouth daily.  10/31/14  Yes Historical Provider, MD  pantoprazole (PROTONIX) 40 MG tablet Take 1 tablet (40 mg total) by mouth daily. 09/30/14  Yes Parker Po, FNP  sulfamethoxazole-trimethoprim (BACTRIM DS,SEPTRA DS) 800-160 MG per tablet Take 1  tablet by mouth 2 (two) times daily. 10/15/14  Yes Historical Provider, MD  vitamin C (ASCORBIC ACID) 500 MG tablet Take 500 mg by mouth 2 (two) times daily.   Yes Historical Provider, MD  zinc sulfate 220 MG capsule Take 220 mg by mouth at bedtime.    Yes Historical Provider, MD    Physical Exam: BP 100/47 mmHg  Pulse 49  Temp(Src) 97.4 F (36.3 C) (Rectal)  Resp 19  SpO2 97%  General:  Fatigued, interactive Eyes: Sclera nonicteric ENT: Normocephalic, atraumatic, mucous vitamins are dry Neck: No JVD Cardiovascular: Regular rate and rhythm, A2-Q3, 3/6 systolic ejection murmur Respiratory: Scattered rhonchi Abdomen: Soft, nontender, nondistended, positive bowel sounds Skin: No skin breaks, tears or lesions Musculoskeletal: No clubbing or cyanosis,  1+ pitting edema Psychiatric: Patient is appropriate, no evidence of psychoses Neurologic: No apparent deficits           Labs on Admission:  Basic Metabolic Panel:  Recent Labs Lab 11/12/14 1544 11/12/14 1612  NA 134* 134*  K 5.7* 5.7*  CL 107 111  CO2 12*  --   GLUCOSE 116* 109*  BUN 122* 101*  CREATININE 17.23* >18.00*  CALCIUM 9.3  --   MG 2.9*  --   PHOS 7.7*  --    Liver Function Tests:  Recent Labs Lab 11/12/14 1544  AST 45*  ALT 11  ALKPHOS 75  BILITOT 0.6  PROT 6.2  ALBUMIN 3.0*   No results for input(s): LIPASE, AMYLASE in the last 168 hours. No results for input(s): AMMONIA in the last 168 hours. CBC:  Recent Labs Lab 11/12/14 1544 11/12/14 1612  WBC 3.7*  --   NEUTROABS 2.8  --   HGB 8.6* 9.9*  HCT 25.8* 29.0*  MCV 83.5  --   PLT 87*  --    Cardiac Enzymes: No results for input(s): CKTOTAL, CKMB, CKMBINDEX, TROPONINI in the last 168 hours.  BNP (last 3 results)  Recent Labs  10/05/14 1144 10/09/14 2303 11/12/14 1547  BNP 606.7* 676.8* 3915.8*    ProBNP (last 3 results) No results for input(s): PROBNP in the last 8760 hours.  CBG: No results for input(s): GLUCAP in the last  168 hours.  Radiological Exams on Admission: Dg Chest Portable 1 View  11/12/2014   CLINICAL DATA:  Shortness of breath.  EXAM: PORTABLE CHEST - 1 VIEW  COMPARISON:  10/09/2014  FINDINGS: There is evidence of diffuse pulmonary edema. Cardiac enlargement appears stable. There likely are bilateral pleural effusions.  IMPRESSION: Diffuse pulmonary edema, cardiac enlargement and bilateral pleural effusions.   Electronically Signed   By: Aletta Edouard M.D.   On: 11/12/2014 16:16    EKG: Independently reviewed. Sinus tachycardia with 2nd degree A-V block with 4:1 A-V conduction, ST & T wave abnormality, consider inferolateral ischemia  Assessment/Plan Present on Admission:  . Renal failure (ARF), acute on chronic: Patient now end-stage renal disease. Bladder scan confirms scant amount of urine, so this is not urinary obstruction. Spoke with son and he seemed surprised, but understanding that patient is dying actively. Agreed with plan for making patient comfortable and limiting medications to pain, nausea and ease of breathing. Comfort care at this point.  Non-STEMI: Somewhat elevated troponin because of creatinine respiratory issues, but likely underlying non-STEMI due to renal failure. At this point patient is comfort care  . Chronic diastolic heart failure . Diabetes mellitus . Iron deficiency anemia . Hypertension  Consultants: None  Code Status: DO NOT RESUSCITATE  Family Communication: Son at the bedside  Disposition Plan: Patient likely will pass in the next few days  Time spent: 45 minutes  [facility] Pager [phone]

## 2014-11-12 NOTE — ED Notes (Signed)
Bladder scan shows urine level of 73ml. MD [place] Pink notified, stated to sent patient to admission room.

## 2014-11-12 NOTE — ED Notes (Signed)
Attempted report to floor.  

## 2014-11-12 NOTE — ED Notes (Addendum)
Elevated troponin of 7.38 reported to Dr. Justin Mend

## 2014-11-12 NOTE — Progress Notes (Signed)
ANTIBIOTIC CONSULT NOTE - INITIAL  Pharmacy Consult for Vancomycin and Zosyn Indication: Sespsis  No Known Allergies  Patient Measurements:   TBW as of 09/2014 - 79.5 kg  Vital Signs: Temp: 94.3 F (34.6 C) (02/16 1550) Temp Source: Rectal (02/16 1550) BP: 89/45 mmHg (02/16 1630) Pulse Rate: 45 (02/16 1630) Intake/Output from previous day:   Intake/Output from this shift:    Labs:  Recent Labs  11/12/14 1544 11/12/14 1612  WBC 3.7*  --   HGB 8.6* 9.9*  PLT PENDING  --   CREATININE 17.23* >18.00*   CrCl cannot be calculated (Unknown ideal weight.). No results for input(s): VANCOTROUGH, VANCOPEAK, VANCORANDOM, GENTTROUGH, GENTPEAK, GENTRANDOM, TOBRATROUGH, TOBRAPEAK, TOBRARND, AMIKACINPEAK, AMIKACINTROU, AMIKACIN in the last 72 hours.   Microbiology: No results found for this or any previous visit (from the past 720 hour(s)).  Medical History: Past Medical History  Diagnosis Date  . CAD (coronary artery disease)     s/p 3v CABG 2006, myoview 04/2010 EF 49%, prior inferior/apical infarct, no ischemia, LHC 11/2011 stable anatomy (occluded LAD filled from vein graft, distal LCx occluded prior to OM2, OM2 filled from vein graft, RCA prox 25%, mid 40%, distal 25% lesions, SVG to diagonal occluded) Med Rx  . Chronic diastolic heart failure     hx of cardiorenal syndrome;  Echo 06/2012 EF 62-37%, mod diastolic dysfunction, mild MR, mod TR, mild R/LAE, mild RV dilatation, PASP 40mHg.  . Carotid stenosis     a. Carotid dopplers R 40-59%, Left 60-79%;   b. carotids 12/13:  06-28%RICA, 631-51%LICA (rpt in 6 mos);  c.  Carotid [place](11/14):  R 40-59%; L 60-79%, R vertebral occluded, L vertebral antegrade  . HLD (hyperlipidemia)   . Mediastinal adenopathy     per CT chest in 2006 with PET scan showing very limited metabolic activity; not felt to have a significant neoplastic potential  . Iron deficiency anemia   . Hypertension   . Diabetes mellitus     insulin dependent   . GERD  (gastroesophageal reflux disease)   . AV block, Mobitz 1     Noted 11/2011 in hospital, BB stopped  . Colon polyps   . Diverticulosis   . Pulmonary arterial hypertension     a.  RHC 11/1 PASP 60 mmHg (mean 38); b. RHC 11/15 PA pressure 66/24, PCWP 26 and CO 6.2 => Sildenafil started;   c. Echo bubble study 11/13:  no obvious shunt;   d. VQ scan neg for pulmonary embolism  . CKD (chronic kidney disease) stage 5, GFR less than 15 ml/min     hx of cardiorenal syndrome;  Dr PPosey Pronto  AVF recommended and scheduled (patient hesitant to proceed);   Renal [place]11/13: diff echogenic kidneys c/w medical renal disease; no hydronephrosis or renal mass  . Hyperparathyroidism, secondary renal   . MGUS (monoclonal gammopathy of unknown significance)     a. Kappa/Lambda free light chain ratio 11/13:  23.99;  b. Met. Bone survey 11/13:  no osteolytic lesions to suggest MMyeloma  . Atrial flutter     diagnosed 05/2013; Coumadin initiated    Medications:   (Not in a hospital admission)   Assessment: 81yo M presents on 2/16 with SOB. Code sepsis was called and pharmacy to dose zosyn and vancomycin. Pt is hypothermic and WBC low at 3.7. SCr is > 18, with a history of CKD stage 5. Pt states he does not wish to have dialysis. Pt given zosyn 3.375g IV x 1  and vancomycin 1g IV x 1 in the ED.   Goal of Therapy:  Vancomycin trough level 15-20 mcg/ml  Resolution of infection  Plan:  Start Zosyn 2.25g IV Q8 Give vancomycin 736m IV x 1 to complete load Monitor renal function, clinical picture F/U if pt is going to receive HD and adjust dosing   Felma Pfefferle J 11/12/2014,5:03 PM

## 2014-11-12 NOTE — ED Notes (Signed)
Chem-8 with flagged TC02 reported to Dr. Justin Mend

## 2014-11-12 NOTE — H&P (Signed)
PULMONARY / CRITICAL CARE MEDICINE   Name: [name] MRN: [id number] DOB: 1931/05/17    ADMISSION DATE:  11/12/2014 CONSULTATION DATE:  11/12/2014  REFERRING MD :  EDP  CHIEF COMPLAINT:  SOB  INITIAL PRESENTATION: 81 year old male with ESRD presented to [facility] ED 2/16 c/o SOB. In ED was found to be hypothermic, hypotensive and bradycardic. Also hyperkalemic with creat > 18. Refusing dialysis. PCCM to see.  STUDIES:  CXR admission > Diffuse pulmonary edema.  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 In chart review, it appears as though his creatinine has been slowly increasing over the past few months and he has consistently declined dialysis as a therapy.  2/16 he presented to [facility] ED complaining of SOB and abdominal bloating. In ED he was found to be hypotensive, hypothermic, and bradycardic. Creatinine profoundly elevated at > 18. Also hyperkalemic at 5.7. PCCM asked to eval.   PAST MEDICAL HISTORY :   has a past medical history of CAD (coronary artery disease); Chronic diastolic heart failure; Carotid stenosis; HLD (hyperlipidemia); Mediastinal adenopathy; Iron deficiency anemia; Hypertension; Diabetes mellitus; GERD (gastroesophageal reflux disease); AV block, Mobitz 1; Colon polyps; Diverticulosis; Pulmonary arterial hypertension; CKD (chronic kidney disease) stage 5, GFR less than 15 ml/min; Hyperparathyroidism, secondary renal; MGUS (monoclonal gammopathy of unknown significance); and Atrial flutter.  has past surgical history that includes Appendectomy; Coronary artery bypass graft (2006); Cardiac catheterization (12/21/2011); left heart catheterization with coronary/graft angiogram (N/A, 12/21/2011); right heart catheterization (Bilateral, 07/28/2012); and right  heart catheterization (N/A, 08/11/2012). Prior to Admission medications   Medication Sig Start Date End Date Taking? Authorizing Provider  amLODipine (NORVASC) 10 MG tablet Take 10 mg by mouth daily.    Historical Provider, MD  aspirin 81 MG chewable tablet Chew 1 tablet (81 mg total) by mouth daily. 10/07/14   Kelvin Cellar, MD  atorvastatin (LIPITOR) 20 MG tablet take 1 tablet by mouth at bedtime 09/14/13   Lelon Perla, MD  carvedilol (COREG) 6.25 MG tablet Take 1 tablet (6.25 mg total) by mouth 2 (two) times daily. 09/05/14   Mauricio Po, FNP  docusate sodium 100 MG CAPS Take 100 mg by mouth 2 (two) times daily. 10/07/14   Kelvin Cellar, MD  feeding supplement, GLUCERNA SHAKE, (GLUCERNA SHAKE) LIQD Take 237 mLs by mouth 2 (two) times daily between meals. 10/11/14   Maryann Mikhail, DO  ferrous sulfate 325 (65 FE) MG tablet Take 1 tablet (325 mg total) by mouth daily with breakfast. 10/07/14   Kelvin Cellar, MD  furosemide (LASIX) 80 MG tablet Take 1 tablet by mouth daily. 10/29/14   Historical Provider, MD  Layla Barter 15 GM/60ML suspension Take 15 g by mouth once.  10/30/14   Historical Provider, MD  linagliptin (TRADJENTA) 5 MG TABS tablet Take 1 tablet (5 mg total) by mouth daily. 09/30/14   Mauricio Po, FNP  megestrol (MEGACE) 40 MG/ML suspension  10/31/14   Historical Provider, MD  pantoprazole (PROTONIX) 40 MG tablet Take 1 tablet (40 mg total) by mouth daily. 09/30/14   Mauricio Po, FNP  polyethylene glycol powder (GLYCOLAX/MIRALAX) powder Take 1 Container by mouth daily as needed for mild constipation.     Historical Provider, MD  sulfamethoxazole-trimethoprim (BACTRIM DS,SEPTRA DS) 800-160 MG per tablet Take  1 tablet by mouth 2 (two) times daily. 10/15/14   Historical Provider, MD  vitamin C (ASCORBIC ACID) 500 MG tablet Take 500 mg by mouth 2 (two) times daily.    Historical Provider, MD  Vitamin D, Ergocalciferol, (DRISDOL) 50000 UNITS CAPS capsule Take 50,000 Units by mouth every  Friday.     Historical Provider, MD  zinc gluconate 50 MG tablet Take 220 mg by mouth daily.     Historical Provider, MD   No Known Allergies  FAMILY HISTORY:  indicated that his mother is deceased. He indicated that his father is deceased. He indicated that all of his three sisters are deceased. He indicated that two of his seven brothers are alive.  SOCIAL HISTORY:  reports that he quit smoking about 61 years ago. He has never used smokeless tobacco. He reports that he does not drink alcohol or use illicit drugs.  REVIEW OF SYSTEMS:   Bolds are positive  Constitutional: weight loss, gain, night sweats, Fevers, chills, fatigue .  HEENT: headaches, Sore throat, sneezing, nasal congestion, post nasal drip, Difficulty swallowing, Tooth/dental problems, visual complaints visual changes, ear ache CV:  chest pain, radiates: ,Orthopnea, PND, swelling in lower extremities, dizziness, palpitations, syncope.  GI  heartburn, indigestion, abdominal pain, nausea, vomiting, diarrhea, change in bowel habits, loss of appetite, bloody stools.  Resp: cough, productive: , hemoptysis, dyspnea, chest pain, pleuritic.  Skin: rash or itching or icterus GU: dysuria, change in color of urine, urgency or frequency. flank pain, hematuria  MS: joint pain or swelling. decreased range of motion  Psych: change in mood or affect. depression or anxiety.  Neuro: difficulty with speech, weakness, numbness, ataxia    SUBJECTIVE:   VITAL SIGNS: Temp:  [94.2 F (34.6 C)-94.3 F (34.6 C)] 94.3 F (34.6 C) (02/16 1550) Pulse Rate:  [44-47] 47 (02/16 1730) Resp:  [18] 18 (02/16 1530) BP: (78-101)/(42-58) 93/48 mmHg (02/16 1730) SpO2:  [97 %-100 %] 97 % (02/16 1730) HEMODYNAMICS:   VENTILATOR SETTINGS:   INTAKE / OUTPUT: No intake or output data in the 24 hours ending 11/12/14 1755  PHYSICAL EXAMINATION: General:  Thin elderly male in NAD Neuro:  Alert, oriented, R sided weakness, not new onset.  HEENT:  [place]/AT,  PERRL Cardiovascular:  Loletha Grayer, regular, no MRG Lungs:  Coarse bilateral breath sounds Abdomen:  Soft, non-tender, non-distended. Musculoskeletal:  No acute deformity Skin:  Grossly intact  LABS:  CBC  Recent Labs Lab 11/12/14 1544 11/12/14 1612  WBC 3.7*  --   HGB 8.6* 9.9*  HCT 25.8* 29.0*  PLT 87*  --    Coag's No results for input(s): APTT, INR in the last 168 hours. BMET  Recent Labs Lab 11/12/14 1544 11/12/14 1612  NA 134* 134*  K 5.7* 5.7*  CL 107 111  CO2 12*  --   BUN 122* 101*  CREATININE 17.23* >18.00*  GLUCOSE 116* 109*   Electrolytes  Recent Labs Lab 11/12/14 1544  CALCIUM 9.3  MG 2.9*  PHOS 7.7*   Sepsis Markers  Recent Labs Lab 11/12/14 1612  LATICACIDVEN 0.84   ABG No results for input(s): PHART, PCO2ART, PO2ART in the last 168 hours. Liver Enzymes  Recent Labs Lab 11/12/14 1544  AST 45*  ALT 11  ALKPHOS 75  BILITOT 0.6  ALBUMIN 3.0*   Cardiac Enzymes No results for input(s): TROPONINI, PROBNP in the last 168 hours. Glucose No results for input(s): GLUCAP in the last 168 hours.  Imaging No results found.   ASSESSMENT / PLAN:  Patient is experiencing SOB 2nd to diffuse pulmonary edema for ESRD. He is grossly volume overloaded, yet continues to decline dialysis as a therapy. He is aware that due to this decision, his condition will probably continue to worsen until it causes him to die. In conversation with he and his son, it is clear that under no circumstances would he want dialysis. Given that information, it would be unwise for him to undergo any type of aggressive medical therapy such as mechanical ventilation or ACLS in the event of a cardiac arrest. He is aware and agrees to this.   ESRD Hypoxemic respiratory failure 2nd to pulmonary edema Bradycardia Hyperkalemia  - Recommend medical management as able under hospitalist direction  - No Dialysis  - DNR/DNI  - As he becomes more uncomfortable with increasing  distress, should consider comfort at that time.    Georgann Housekeeper, AGACNP-BC [place] [facility]/Critical Care Pager [phone] or [phone]  Spoke with patient and son at length.  He is very clear that he does not want dialysis.  He stated he would rather die than have dialysis.  His son wished for him to be full code however.  After discussion with patient and son, I explained that he will develop respiratory failure regardless due to fluid overload.  Time frame unknown.  And given that he does not wish for dialysis then I can not fix the underlying cause of his failure.  Thus eventually the decision will have to be made to remove him from life support after a period of prolonged suffering.  Given above, decision was made to make patient a full DNR with no intubation, CPR, cardioversion, pressors or anti-arrhythmics.  Patient and son agreed to that.  Would recommend seeing palliative care.  May admit to the floor under [facility] service.  PCCM will sign off.  The patient is critically ill with multiple organ systems failure and requires high complexity decision making for assessment and support, frequent evaluation and titration of therapies, application of advanced monitoring technologies and extensive interpretation of multiple databases.   Critical Care Time devoted to patient care services described in this note is  35  Minutes. This time reflects time of care of this signee Dr Jennet Maduro. This critical care time does not reflect procedure time, or teaching time or supervisory time of PA/NP/Med student/Med Resident etc but could involve care discussion time.  Rush Farmer, M.D. [facility] Pulmonary/Critical Care Medicine. Pager: [phone]. After hours pager: [phone].

## 2014-11-12 NOTE — Progress Notes (Signed)
Attempted report. Awaiting call back.

## 2014-11-12 NOTE — ED Notes (Signed)
Attempted to call report

## 2014-11-12 NOTE — Progress Notes (Signed)
Report obtained from ED RN. Sharnae Winfree Joselita, RN 

## 2014-11-12 NOTE — ED Provider Notes (Signed)
CSN: [id number]     Arrival date & time 11/12/14  1458 History   First MD Initiated Contact with Patient 11/12/14 1539     Chief Complaint  Patient presents with  . Shortness of Breath     (Consider location/radiation/quality/duration/timing/severity/associated sxs/prior Treatment) HPI Comments: 81 y.o. male with past medical history of CAD, chronic diastolic heart failure, diabetes mellitus and stage V CKD who presents with shortness of breath.  Symptoms worsening over the last 2 weeks.  C/o increasing shortness of breath at rest, increasing extremity edema, decreased urine output (only three times in the last week), increasing fatigue.  Denies any other symptoms currently.  Pt followed by medicine and cardiology, and has been offered dialysis given ESRD, but declines that service.  Pt had elevated K 6.5 several weeks ago, treated with kayexalate as outpatient.  No other concerns at this time.    The history is provided by the patient, the EMS personnel and a relative. No language interpreter was used.    Past Medical History  Diagnosis Date  . CAD (coronary artery disease)     s/p 3v CABG 2006, myoview 04/2010 EF 49%, prior inferior/apical infarct, no ischemia, LHC 11/2011 stable anatomy (occluded LAD filled from vein graft, distal LCx occluded prior to OM2, OM2 filled from vein graft, RCA prox 25%, mid 40%, distal 25% lesions, SVG to diagonal occluded) Med Rx  . Chronic diastolic heart failure     hx of cardiorenal syndrome;  Echo 06/2012 EF 57-84%, mod diastolic dysfunction, mild MR, mod TR, mild R/LAE, mild RV dilatation, PASP 38mHg.  . Carotid stenosis     a. Carotid dopplers R 40-59%, Left 60-79%;   b. carotids 12/13:  06-96%RICA, 629-52%LICA (rpt in 6 mos);  c.  Carotid [place](11/14):  R 40-59%; L 60-79%, R vertebral occluded, L vertebral antegrade  . HLD (hyperlipidemia)   . Mediastinal adenopathy     per CT chest in 2006 with PET scan showing very limited metabolic activity; not felt  to have a significant neoplastic potential  . Iron deficiency anemia   . Hypertension   . Diabetes mellitus     insulin dependent   . GERD (gastroesophageal reflux disease)   . AV block, Mobitz 1     Noted 11/2011 in hospital, BB stopped  . Colon polyps   . Diverticulosis   . Pulmonary arterial hypertension     a.  RHC 11/1 PASP 60 mmHg (mean 38); b. RHC 11/15 PA pressure 66/24, PCWP 26 and CO 6.2 => Sildenafil started;   c. Echo bubble study 11/13:  no obvious shunt;   d. VQ scan neg for pulmonary embolism  . CKD (chronic kidney disease) stage 5, GFR less than 15 ml/min     hx of cardiorenal syndrome;  Dr PPosey Pronto  AVF recommended and scheduled (patient hesitant to proceed);   Renal [place]11/13: diff echogenic kidneys c/w medical renal disease; no hydronephrosis or renal mass  . Hyperparathyroidism, secondary renal   . MGUS (monoclonal gammopathy of unknown significance)     a. Kappa/Lambda free light chain ratio 11/13:  23.99;  b. Met. Bone survey 11/13:  no osteolytic lesions to suggest MMyeloma  . Atrial flutter     diagnosed 05/2013; Coumadin initiated   Past Surgical History  Procedure Laterality Date  . Appendectomy    . Coronary artery bypass graft  2006    SVG to OM2, SVG to LAD, SVG to DX; (the LIMA did not have good flow  and therefore was not used)  . Cardiac catheterization  12/21/2011  . Left heart catheterization with coronary/graft angiogram N/A 12/21/2011    Procedure: LEFT HEART CATHETERIZATION WITH Beatrix Fetters;  Surgeon: Burnell Blanks, MD;  Location: [facility] CATH LAB;  Service: Cardiovascular;  Laterality: N/A;  . Right heart catheterization Bilateral 07/28/2012    Procedure: RIGHT HEART CATH;  Surgeon: Larey Dresser, MD;  Location: [facility] CATH LAB;  Service: Cardiovascular;  Laterality: Bilateral;  . Right heart catheterization N/A 08/11/2012    Procedure: RIGHT HEART CATH;  Surgeon: Thayer Headings, MD;  Location: [facility] CATH LAB;  Service: Cardiovascular;   Laterality: N/A;   Family History  Problem Relation Age of Onset  . Stomach cancer Sister   . Pancreatic cancer Brother   . Lung cancer Brother   . Colon cancer Neg Hx    History  Substance Use Topics  . Smoking status: Former Smoker    Quit date: 09/27/1953  . Smokeless tobacco: Never Used  . Alcohol Use: No    Review of Systems  Constitutional: Positive for fatigue. Negative for fever and chills.  Respiratory: Positive for cough and shortness of breath.   Cardiovascular: Positive for leg swelling. Negative for chest pain.  Gastrointestinal: Negative for nausea, vomiting, abdominal pain, diarrhea and constipation.  Genitourinary: Positive for decreased urine volume. Negative for dysuria.  Musculoskeletal: Negative for myalgias.  Skin: Negative for rash.  Neurological: Negative for dizziness, weakness, light-headedness, numbness and headaches.  Hematological: Negative for adenopathy. Does not bruise/bleed easily.  All other systems reviewed and are negative.     Allergies  Review of patient's allergies indicates no known allergies.  Home Medications   Prior to Admission medications   Medication Sig Start Date End Date Taking? Authorizing Provider  amLODipine (NORVASC) 10 MG tablet Take 10 mg by mouth daily.    Historical Provider, MD  aspirin 81 MG chewable tablet Chew 1 tablet (81 mg total) by mouth daily. 10/07/14   Kelvin Cellar, MD  atorvastatin (LIPITOR) 20 MG tablet take 1 tablet by mouth at bedtime 09/14/13   Lelon Perla, MD  carvedilol (COREG) 6.25 MG tablet Take 1 tablet (6.25 mg total) by mouth 2 (two) times daily. 09/05/14   Mauricio Po, FNP  docusate sodium 100 MG CAPS Take 100 mg by mouth 2 (two) times daily. 10/07/14   Kelvin Cellar, MD  feeding supplement, GLUCERNA SHAKE, (GLUCERNA SHAKE) LIQD Take 237 mLs by mouth 2 (two) times daily between meals. 10/11/14   Maryann Mikhail, DO  ferrous sulfate 325 (65 FE) MG tablet Take 1 tablet (325 mg total)  by mouth daily with breakfast. 10/07/14   Kelvin Cellar, MD  furosemide (LASIX) 80 MG tablet Take 1 tablet by mouth daily. 10/29/14   Historical Provider, MD  Layla Barter 15 GM/60ML suspension Take 15 g by mouth once.  10/30/14   Historical Provider, MD  linagliptin (TRADJENTA) 5 MG TABS tablet Take 1 tablet (5 mg total) by mouth daily. 09/30/14   Mauricio Po, FNP  megestrol (MEGACE) 40 MG/ML suspension  10/31/14   Historical Provider, MD  pantoprazole (PROTONIX) 40 MG tablet Take 1 tablet (40 mg total) by mouth daily. 09/30/14   Mauricio Po, FNP  polyethylene glycol powder (GLYCOLAX/MIRALAX) powder Take 1 Container by mouth daily as needed for mild constipation.     Historical Provider, MD  sulfamethoxazole-trimethoprim (BACTRIM DS,SEPTRA DS) 800-160 MG per tablet Take 1 tablet by mouth 2 (two) times daily. 10/15/14   Historical Provider, MD  vitamin C (ASCORBIC ACID) 500 MG tablet Take 500 mg by mouth 2 (two) times daily.    Historical Provider, MD  Vitamin D, Ergocalciferol, (DRISDOL) 50000 UNITS CAPS capsule Take 50,000 Units by mouth every Friday.     Historical Provider, MD  zinc gluconate 50 MG tablet Take 220 mg by mouth daily.     Historical Provider, MD   BP 98/49 mmHg  Pulse 45  Temp(Src) 94.3 F (34.6 C) (Rectal)  Resp 18  SpO2 100% Physical Exam  Constitutional: He is oriented to person, place, and time.  HENT:  Head: Normocephalic and atraumatic.  Right Ear: External ear normal.  Left Ear: External ear normal.  Mouth/Throat: Oropharynx is clear and moist.  Eyes: Conjunctivae and EOM are normal. Pupils are equal, round, and reactive to light.  Neck: Normal range of motion. Neck supple.  Cardiovascular: Normal heart sounds and intact distal pulses.   Bradycardia  2+ pitting edema BLE.   Pulmonary/Chest: No respiratory distress. He has no wheezes. He has rales. He exhibits no tenderness.  Tachypnea, decreased breath sounds bilaterally, rales.    Abdominal: Soft. Bowel sounds are  normal. He exhibits no distension and no mass. There is no tenderness. There is no rebound and no guarding.  Musculoskeletal: Normal range of motion.  Neurological: He is alert and oriented to person, place, and time.  Skin: Skin is warm and dry.  Nursing note and vitals reviewed.   ED Course  Procedures (including critical care time) Labs Review Labs Reviewed  CBC WITH DIFFERENTIAL/PLATELET - Abnormal; Notable for the following:    WBC 3.7 (*)    RBC 3.09 (*)    Hemoglobin 8.6 (*)    HCT 25.8 (*)    RDW 19.6 (*)    Platelets 87 (*)    Lymphs Abs 0.6 (*)    All other components within normal limits  COMPREHENSIVE METABOLIC PANEL - Abnormal; Notable for the following:    Sodium 134 (*)    Potassium 5.7 (*)    CO2 12 (*)    Glucose, Bld 116 (*)    BUN 122 (*)    Creatinine, Ser 17.23 (*)    Albumin 3.0 (*)    AST 45 (*)    GFR calc non Af Amer 2 (*)    GFR calc Af Amer 3 (*)    All other components within normal limits  MAGNESIUM - Abnormal; Notable for the following:    Magnesium 2.9 (*)    All other components within normal limits  PHOSPHORUS - Abnormal; Notable for the following:    Phosphorus 7.7 (*)    All other components within normal limits  BRAIN NATRIURETIC PEPTIDE - Abnormal; Notable for the following:    B Natriuretic Peptide 3915.8 (*)    All other components within normal limits  I-STAT TROPOININ, ED - Abnormal; Notable for the following:    Troponin i, poc 7.38 (*)    All other components within normal limits  I-STAT CHEM 8, ED - Abnormal; Notable for the following:    Sodium 134 (*)    Potassium 5.7 (*)    BUN 101 (*)    Creatinine, Ser >18.00 (*)    Glucose, Bld 109 (*)    Hemoglobin 9.9 (*)    HCT 29.0 (*)    All other components within normal limits  CULTURE, BLOOD (ROUTINE X 2)  CULTURE, BLOOD (ROUTINE X 2)  GLUCOSE, CAPILLARY  URINALYSIS, ROUTINE W REFLEX MICROSCOPIC  I-STAT CG4 LACTIC ACID,  ED    Imaging Review Dg Chest Portable 1  View  11/12/2014   CLINICAL DATA:  Shortness of breath.  EXAM: PORTABLE CHEST - 1 VIEW  COMPARISON:  10/09/2014  FINDINGS: There is evidence of diffuse pulmonary edema. Cardiac enlargement appears stable. There likely are bilateral pleural effusions.  IMPRESSION: Diffuse pulmonary edema, cardiac enlargement and bilateral pleural effusions.   Electronically Signed   By: Aletta Edouard M.D.   On: 11/12/2014 16:16     EKG Interpretation   Date/Time:  Tuesday November 12 2014 15:24:38 EST Ventricular Rate:  47 PR Interval:    QRS Duration: 118 QT Interval:  482 QTC Calculation: 426 R Axis:   -10 Text Interpretation:  Atrial flutter with 2nd degree A-V block with 4:1  A-V conduction ST \T\ T wave abnormality, consider inferolateral ischemia  Abnormal ECG Confirmed by Ashok Cordia  MD, Lennette Bihari ([id number]) on 11/12/2014 8:49:19  PM      MDM   Final diagnoses:  Renal failure (ARF), acute on chronic  Other hypervolemia  Hypothermia, initial encounter  Hyperkalemia   81 y.o. male with past medical history of CAD, chronic diastolic heart failure, diabetes mellitus and stage V CKD who presents with shortness of breath.   Physical exam as above.  Pt hypothermic to 94, bradycardic, tachypneic but satting 100% on [place], appears fluid overloaded.  EKG with atrial flutter with 2nd degree AV block 4:1 AV conduction.  K elevated to 5.7.  Mag and phos elevated.  Metabolic acidosis with bicarb of 12.  Elevated BUN/Cr 122/17.23.  LA WNL however.  Troponin > 7.  BNP 3915.  Glucose WNL.    Pt made code sepsis given VS, however unclear source.  Given Vanc, Zosyn, 250 cc bolus given hypotension.  Pt not given 30 cc/kg of fluids 2/2 fluid overload.    Pt given Calcium gluconate, kayexlate for hyper K with EKG changes.    Diagnosis of fluid overload and hyperK 2/2 worsening ESRD, decline of dialysis; possible sepsis, treated empirically.    Critical care consulted, as pt and family initially states they still want  intubation and ACLS; despite declining dialysis for fluid overload and hyperK.  Critical care team evaluated pt in ED, spoke with both patient and son at length, and after discussion family and pt elected to switch to DNR/DNI, comfort care.   Medicine consulted for admission.  Pt and family understand and agree with plan.   Sinda Du   Discussed pt with my attending Dr. Ashok Cordia.    Sinda Du, MD 11/13/14 [place], MD 11/09/2014 [phone]

## 2014-11-12 NOTE — ED Notes (Signed)
Spoke with MD [place] Pink regarding patient admission orders, MD requested to have bladder scan completed in ED and if this indicates urinary retention to place foley catheter in ED, this test result will determine patient plan of care. Please call MD with results.

## 2014-11-12 NOTE — ED Notes (Signed)
Pt here for fluid on lungs and swelling to abd area; pt noted to be brady, hypotensive and hypothermic

## 2014-11-12 NOTE — ED Notes (Signed)
Pt family reports that he rarely produces urine- sts only urinated a few times in the past 2 weeks.

## 2014-11-12 NOTE — ED Notes (Signed)
Family reports that pt does not wish to have dialysis and was taking medication for elevated potassium which gave him diarrhea. Pt here today for evaluation of sob, abdominal swelling and fatigue.

## 2014-11-13 ENCOUNTER — Encounter (HOSPITAL_COMMUNITY): Payer: Medicare HMO

## 2014-11-13 MED ORDER — COLLAGENASE 250 UNIT/GM EX OINT
TOPICAL_OINTMENT | Freq: Every day | CUTANEOUS | Status: DC
  Administered 2014-11-13 – 2014-11-15 (×3): via TOPICAL
  Filled 2014-11-13: qty 30

## 2014-11-13 NOTE — Consult Note (Signed)
WOC wound consult note Reason for Consult: Consult requested for left heel.   Right heel with pink dry scar tissue from previous wound which has healed. Wound type: chronic unstageable wound Pressure Ulcer POA: Yes Measurement: 2X3.2X.5cm Wound bed: 100% moist slough Drainage (amount, consistency, odor) Small amt yellow drainage, no odor Periwound: Intact skin surrounding Dressing procedure/placement/frequency: Santyl to chemically debride nonviable tissue.  Float heels to reduce pressure. Please re-consult if further assistance is needed.  Thank-you,  Julien Girt MSN, [place], [place], [place], [place]

## 2014-11-13 NOTE — Progress Notes (Signed)
Utilization review completed. Slayton Lubitz, RN, BSN. 

## 2014-11-13 NOTE — ED Notes (Signed)
Per Nelda Marseille, MD, pt & family requests DNR status & no use of medication to aid in increasing BP, repeat CG4 not to be completed

## 2014-11-13 NOTE — Clinical Documentation Improvement (Signed)
  MD's, NP's, and PA's  A cause and effect relationship may not be assumed and must be documented by a provider. Please clarify the relationship, if any, between   ESRD  and Diabetes type 2.  Thank ylou  Are the conditions: ? Due to or associated with each other (ESRD due to Diabetes II) ? Diabetic Nephropathy  ? Unrelated to each other ? Unable to determine ? Unknown    Risk Factors: ESRD, HTN  Treatment: for volume overload, refusal of Dialysis  Thank you, Ree Kida ,RN Clinical Documentation Specialist:  [place] [facility]

## 2014-11-13 NOTE — Progress Notes (Signed)
[facility] Progress Note   DOYEL MULKERN SHF:[id number] DOB: September 15, 1931 DOA: 11/12/2014 PCP: Mauricio Po, FNP  Brief narrative: [name] is a 81 y.o. male with past medical history of CAD, chronic diastolic heart failure, diabetes mellitus and stage V CK D who presents with shortness of breath and is noted to be hypothermic, hypotensive and bradycardic. chest x-ray reveals diffuse pulmonary edema with cardiac enlargement and bilateral pleural effusions. He continues to decline dialysis.   Subjective: Patient has no complaints for me today. He understands that declining dialysis can lead to imminent death. When asked about activities of daily living, he tells me that he has not been able to ambulate in close to a month.He is in agreement with speaking with hospice/palliative medicine.   Assessment/Plan: Principal Problem:   Renal failure (ARF), acute on chronic-  Chronic diastolic heart failure-acute respiratory failure - As he is declining dialysis, I have consult at hospice palliative care who will speak with him and his family and further decide this disposition. - He is DO NOT RESUSCITATE and currently appears to be stable on supplemental oxygen -When necessary morphine has been ordered  Active Problems:    Diabetes mellitus   Iron deficiency anemia   Hypertension    - We have stopped treating above-mentioned medical issues in light of the fact that he has been transitioned to comfort care.    Code Status: DO NOT RESUSCITATE Family Communication:  Disposition Plan: To be determined after palliative care meeting-he states he lives at home alone with his son and he will need to be decided if he can return home and his condition DVT prophylaxis: none  Consultants: Palliative care team  Procedures:   Antibiotics: Anti-infectives    Start     Dose/Rate Route Frequency Ordered Stop   11/13/14 0000  piperacillin-tazobactam (ZOSYN) IVPB 2.25 g  Status:   Discontinued     2.25 g 100 mL/hr over 30 Minutes Intravenous Every 8 hours 11/12/14 1717 11/13/14 0949   11/12/14 1800  vancomycin (VANCOCIN) IVPB 750 mg/150 ml premix     750 mg 150 mL/hr over 60 Minutes Intravenous  Once 11/12/14 1716 11/12/14 1912   11/12/14 1645  piperacillin-tazobactam (ZOSYN) IVPB 3.375 g     3.375 g 100 mL/hr over 30 Minutes Intravenous  Once 11/12/14 1631 11/12/14 1707   11/12/14 1645  vancomycin (VANCOCIN) IVPB 1000 mg/200 mL premix     1,000 mg 200 mL/hr over 60 Minutes Intravenous  Once 11/12/14 1631 11/12/14 1740      Objective: Filed Weights   11/12/14 2137  Weight: 85.276 kg (188 lb)    Intake/Output Summary (Last 24 hours) at 11/13/14 1137 Last data filed at 11/13/14 0830  Gross per 24 hour  Intake    290 ml  Output    127 ml  Net    163 ml     Vitals Filed Vitals:   11/12/14 2051 11/12/14 2137 11/13/14 0618 11/13/14 0935  BP:  94/50 123/51 116/79  Pulse: 49 52 56 78  Temp: 97.4 F (36.3 C) 98 F (36.7 C) 97.7 F (36.5 C) 98.5 F (36.9 C)  TempSrc: Rectal Oral Oral Oral  Resp:  18 18 18   Height:  5\' 11"  (1.803 m)    Weight:  85.276 kg (188 lb)    SpO2: 97% 97% 99% 98%    Exam: General: Awake alert oriented 3, No acute respiratory distress Lungs: Clear to auscultation bilaterally without wheezes or crackles Cardiovascular: Regular rate and  rhythm without murmur gallop or rub normal S1 and S2 Abdomen: Nontender, nondistended, soft, bowel sounds positive, no rebound, no ascites, no appreciable mass Extremities: No significant cyanosis, clubbing, or edema bilateral lower extremities  Data Reviewed: Basic Metabolic Panel:  Recent Labs Lab 11/12/14 1544 11/12/14 1612  NA 134* 134*  K 5.7* 5.7*  CL 107 111  CO2 12*  --   GLUCOSE 116* 109*  BUN 122* 101*  CREATININE 17.23* >18.00*  CALCIUM 9.3  --   MG 2.9*  --   PHOS 7.7*  --    Liver Function Tests:  Recent Labs Lab 11/12/14 1544  AST 45*  ALT 11  ALKPHOS 75   BILITOT 0.6  PROT 6.2  ALBUMIN 3.0*   No results for input(s): LIPASE, AMYLASE in the last 168 hours. No results for input(s): AMMONIA in the last 168 hours. CBC:  Recent Labs Lab 11/12/14 1544 11/12/14 1612  WBC 3.7*  --   NEUTROABS 2.8  --   HGB 8.6* 9.9*  HCT 25.8* 29.0*  MCV 83.5  --   PLT 87*  --    Cardiac Enzymes: No results for input(s): CKTOTAL, CKMB, CKMBINDEX, TROPONINI in the last 168 hours. BNP (last 3 results)  Recent Labs  10/05/14 1144 10/09/14 2303 11/12/14 1547  BNP 606.7* 676.8* 3915.8*    ProBNP (last 3 results) No results for input(s): PROBNP in the last 8760 hours.  CBG:  Recent Labs Lab 11/12/14 2133  GLUCAP 93    Recent Results (from the past 240 hour(s))  Culture, blood (routine x 2)     Status: None (Preliminary result)   Collection Time: 11/12/14  3:45 PM  Result Value Ref Range Status   Specimen Description BLOOD RIGHT FOREARM  Final   Special Requests BOTTLES DRAWN AEROBIC AND ANAEROBIC 5CC  Final   Culture   Final           BLOOD CULTURE RECEIVED NO GROWTH TO DATE CULTURE WILL BE HELD FOR 5 DAYS BEFORE ISSUING A FINAL NEGATIVE REPORT Performed at [facility]    Report Status PENDING  Incomplete  Culture, blood (routine x 2)     Status: None (Preliminary result)   Collection Time: 11/12/14  4:00 PM  Result Value Ref Range Status   Specimen Description BLOOD HAND RIGHT  Final   Special Requests BOTTLES DRAWN AEROBIC AND ANAEROBIC 5CC  Final   Culture   Final           BLOOD CULTURE RECEIVED NO GROWTH TO DATE CULTURE WILL BE HELD FOR 5 DAYS BEFORE ISSUING A FINAL NEGATIVE REPORT Performed at [facility]    Report Status PENDING  Incomplete     Studies:  Recent x-ray studies have been reviewed in detail by the Attending Physician  Scheduled Meds:  Scheduled Meds: . collagenase   Topical Daily   Continuous Infusions:   Time spent on care of this patient: 83 minutes Potter, MD 11/13/2014,  11:37 AM  LOS: 1 day   [facility] Office  [phone] Pager - Text Page per [email or web address]  If 7PM-7AM, please contact night-coverage [email or web address]

## 2014-11-13 NOTE — ED Notes (Signed)
Per pharmacy, pt is to receive additional 750 mg Vancomycin d/t weight

## 2014-11-13 NOTE — Consult Note (Signed)
Met with patient at bedside.  [name] denies pain or SOB at this time.  He appears to be comfortable, resting quietly in bed.  He ate a few bites of lunch, "not feeling very hungry".  Briefly discussed his current condition and verified the information regarding his decline of any dialysis treatment.  In light of his ESRD his prognosis is grave.  Medical history reveals multiple medical co-morbid conditions.    Further discussion with his son Pilar Plate revealed that Mr. Bastos has been steadily declining at home for about the last 5 months.  He requires total care for all ADL's and max assist for transfer to w/c.  Pilar Plate discusses that he is sad to realize the significant decline in his father's health but does verbalize the importance of honoring his wishes at this EOL stage.  Pilar Plate lives with his father and is the primary caregiver.  There are no formal HCPOA documents, Pilar Plate has always assumed this role.  There are 12 siblings in total although Pilar Plate admits that none of the other children have been willing to assist with the care of their father.    Palliative care services including hospice care were discussed and Pilar Plate is familiar with the [facility] , it is within less than a mile of his home.  Pilar Plate is going to discuss further with his father at bedside and then make a decision in the morning.  I will follow up with him tomorrow.  Recommendation:  Continue with comfort care utilizing symptom management as needed.  Referral to [facility] tomorrow for discharge transition.  Kizzie Fantasia, RN, MSN, [facility] Palliative Integration

## 2014-11-14 DIAGNOSIS — Z515 Encounter for palliative care: Secondary | ICD-10-CM

## 2014-11-14 NOTE — Clinical Social Work Note (Addendum)
Consult received today from [name] with Palliative Care regarding residential hospice placement and family preference of [name]. Call made to [place] with referral for [facility]. CSW informed that no bed availability at this time. CSW talked at the bedside with patient, and adult children Eino Whitner and Lysle Morales regarding residential hospice placement and no Administrator. Other options discussed and family decided that they will take patient home with Hospice services. Mr. Samar Dass lives with patient and daughter works but will also assist in his care, along with other family members. Contact will be made with [facility] with referral.  CSW talked by phone with [name] with [facility] regarding patient. Patient is known to her and CSW will contact her tomorrow based on contact with [facility] regarding [facility] bed availability. Lesleigh Noe will need to know if patient needs a hospital bed at home.  An Schnabel Givens, MSW, LCSW Licensed Clinical Social Worker [place] [phone]

## 2014-11-14 NOTE — Progress Notes (Signed)
Confirmed with patient and his son Pilar Plate at bedside that the goals of care are comfort measures for transition to EOL with ESRD. Review of clinical findings and comorbid conditions with a prognosis likely days to a week.  This has been discussed with the patient and family by attending and palliative care consult.   Referral request is for inpatient eligibility by [facility].  RNCM notified.  Kizzie Fantasia, RN,MSN, [facility] Palliative Integration

## 2014-11-14 NOTE — Consult Note (Signed)
[place] Liaison: Received request from [place] for family interest in [facility]. Chart reviewed. Met with daughter briefly and spoke with son by phone. CSW and family aware no Optometrist availability at this time. Will update CSW in am re availability tomorrow. Thank you. Erling Conte LCSW [phone]

## 2014-11-14 NOTE — Progress Notes (Signed)
PROGRESS NOTE  [name] GDJ:[id number] DOB: Oct 21, 1930 DOA: 11/12/2014 PCP: Mauricio Po, FNP  HPI/Recap of past 24 hours: Confused but does not seem in distress, denies pain, currently on room air.   Assessment/Plan: Principal Problem:   Renal failure (ARF), acute on chronic Active Problems:   Chronic diastolic heart failure   Diabetes mellitus   Iron deficiency anemia   Hypertension   ARF (acute renal failure)  Hypoxic respiratory failure due to pulmonary edema/hyperkalemia/ESRD declined dialysis, now on comfort measure, awaiting to be discharge to hospice. Appreciate palliative team input. Anticipate d/c tomorrow.  Code Status: DNR  Family Communication: none currently at bedside  Disposition Plan: hospice   Consultants:  Critical care team signed off  palliative  Procedures:  none  Antibiotics:  none   Objective: BP 124/61 mmHg  Pulse 65  Temp(Src) 97.5 F (36.4 C) (Axillary)  Resp 16  Ht 5\' 11"  (1.803 m)  Wt 85.276 kg (188 lb)  BMI 26.23 kg/m2  SpO2 96%  Intake/Output Summary (Last 24 hours) at 11/14/14 1654 Last data filed at 11/14/14 6222  Gross per 24 hour  Intake    520 ml  Output     81 ml  Net    439 ml   Filed Weights   11/12/14 2137  Weight: 85.276 kg (188 lb)    Exam:   General: Fatigued, confused, but calm, does not seem in acute distress  Eyes: Sclera nonicteric  ENT: Normocephalic, atraumatic, mucous vitamins are dry  Neck: No JVD  Cardiovascular: Regular rate and rhythm, L7-L8, 3/6 systolic ejection murmur  Respiratory: Scattered rhonchi  Abdomen: Soft, nontender, nondistended, positive bowel sounds  Skin: No skin breaks, tears or lesions  Musculoskeletal: No clubbing or cyanosis, 1+ pitting edema  Psychiatric: Patient is appropriate, no evidence of psychoses  Neurologic: No apparent deficits    Data Reviewed: Basic Metabolic Panel:  Recent Labs Lab 11/12/14 1544 11/12/14 1612  NA 134*  134*  K 5.7* 5.7*  CL 107 111  CO2 12*  --   GLUCOSE 116* 109*  BUN 122* 101*  CREATININE 17.23* >18.00*  CALCIUM 9.3  --   MG 2.9*  --   PHOS 7.7*  --    Liver Function Tests:  Recent Labs Lab 11/12/14 1544  AST 45*  ALT 11  ALKPHOS 75  BILITOT 0.6  PROT 6.2  ALBUMIN 3.0*   No results for input(s): LIPASE, AMYLASE in the last 168 hours. No results for input(s): AMMONIA in the last 168 hours. CBC:  Recent Labs Lab 11/12/14 1544 11/12/14 1612  WBC 3.7*  --   NEUTROABS 2.8  --   HGB 8.6* 9.9*  HCT 25.8* 29.0*  MCV 83.5  --   PLT 87*  --    Cardiac Enzymes:   No results for input(s): CKTOTAL, CKMB, CKMBINDEX, TROPONINI in the last 168 hours. BNP (last 3 results)  Recent Labs  10/05/14 1144 10/09/14 2303 11/12/14 1547  BNP 606.7* 676.8* 3915.8*    ProBNP (last 3 results) No results for input(s): PROBNP in the last 8760 hours.  CBG:  Recent Labs Lab 11/12/14 2133  GLUCAP 93    Recent Results (from the past 240 hour(s))  Culture, blood (routine x 2)     Status: None (Preliminary result)   Collection Time: 11/12/14  3:45 PM  Result Value Ref Range Status   Specimen Description BLOOD RIGHT FOREARM  Final   Special Requests BOTTLES DRAWN AEROBIC AND ANAEROBIC 5CC  Final  Culture   Final           BLOOD CULTURE RECEIVED NO GROWTH TO DATE CULTURE WILL BE HELD FOR 5 DAYS BEFORE ISSUING A FINAL NEGATIVE REPORT Performed at [facility]    Report Status PENDING  Incomplete  Culture, blood (routine x 2)     Status: None (Preliminary result)   Collection Time: 11/12/14  4:00 PM  Result Value Ref Range Status   Specimen Description BLOOD HAND RIGHT  Final   Special Requests BOTTLES DRAWN AEROBIC AND ANAEROBIC 5CC  Final   Culture   Final           BLOOD CULTURE RECEIVED NO GROWTH TO DATE CULTURE WILL BE HELD FOR 5 DAYS BEFORE ISSUING A FINAL NEGATIVE REPORT Performed at [facility]    Report Status PENDING  Incomplete      Studies: No results found.  Scheduled Meds: . collagenase   Topical Daily    Continuous Infusions:  none    Robi Mitter  [facility] Pager [phone]. If 7PM-7AM, please contact night-coverage at [email or web address], password [facility] 11/14/2014, 4:54 PM  LOS: 2 days

## 2014-11-15 MED ORDER — LORAZEPAM 2 MG/ML PO CONC: 1.0000 mg | ORAL | Status: AC | PRN

## 2014-11-15 MED ORDER — COLLAGENASE 250 UNIT/GM EX OINT: TOPICAL_OINTMENT | Freq: Every day | CUTANEOUS | Status: AC

## 2014-11-15 MED ORDER — BISACODYL 10 MG RE SUPP: 10.0000 mg | Freq: Once | RECTAL | Status: AC

## 2014-11-15 MED ORDER — BISACODYL 10 MG RE SUPP
10.0000 mg | Freq: Once | RECTAL | Status: DC
  Filled 2014-11-15: qty 1

## 2014-11-15 MED ORDER — BISACODYL 10 MG RE SUPP: 10.0000 mg | RECTAL | Status: AC | PRN

## 2014-11-15 MED ORDER — MINERAL OIL RE ENEM
1.0000 | ENEMA | Freq: Once | RECTAL | Status: DC
  Filled 2014-11-15: qty 1

## 2014-11-15 MED ORDER — MORPHINE SULFATE (CONCENTRATE) 20 MG/ML PO SOLN: 5.0000 mg | ORAL | Status: AC | PRN

## 2014-11-15 MED ORDER — MORPHINE SULFATE 20 MG/5ML PO SOLN: ORAL | Status: DC

## 2014-11-15 NOTE — Progress Notes (Signed)
Notified by Joen Laura pt/ son Pilar Plate request Hosp[facility] and [place] to follow once home. Per discussion with [facility] Dr Erlinda Hong, attending MD, will request O2 2LNC PRN and also send pt home with prescriptions for comfort medications Liquid Concentrated Morphine and Ativan. Chart and pt information reviewed with Dr [facility] Medical Director and eligibility confirmed.  - Pt seen at bedside, no family present, pt awake, alert stated he ate 'some breakfast' stated he was aware of discussion regarding having [facility] follow once home; he stated he just wants to be comfortable; pt agreed with writer contacting his son to discuss needs. Writer spoke with pt's son Pilar Plate at home # [phone]) he indicated his cell # is the best number to reach him [phone]). He informed he is waiting to speak with someone about timing of his dad going home - "I thought it would be later today"; he is agreeable to [facility] transport as he feels pt too weak to travel by personal car. Son shared prior to this hospital stay, he has been able to get pt OOB to [facility] or w/c, but at this point he does not think his dad will be getting out of bed. Son shared he is "amazed that his dad has survived this long as the doctors told him it would be days"; son shared they have had discussions over the last couple of days and their goals are for his dad to be comfortable. Emotional support offered Writer contacted [facility] to request order be placed with [facility] for Oxygen pkg B O2 @ 2LNC PNR with concentrator, E Tanks and suction set up - Request for [facility] to confirm delivery time for today, with son Pilar Plate at [phone]. [facility] Referral Center aware of above and plan for pt to d/c home today by [facility] Please notify [facility] when [facility] on unit & pt ready to leave -call [phone] (OR [phone] after 5 pm) [name], RN MSN [facility] Liaison [phone]

## 2014-11-15 NOTE — Consult Note (Signed)
[place] Liaison:  Continue to follow for family interest in [facility]. Unfortunately no room availability today. Family aware hospice homecare team can assist with possible transfer to [facility] from home if desired. Have made CSW aware no room availability today. Thank you. Erling Conte LCSW [phone]

## 2014-11-15 NOTE — Clinical Documentation Improvement (Signed)
MD's, NP's, and PA's   Medicare rules require specification as to whether an inpatient diagnosis was present at the time of admission.    On admission Troponin level 7.38 documentation of NSTEMI in H + P , not in following progress notes, if this is an appropriate diagnosis please document in notes and discharge summary.  Thank you  Please clarify if the following diagnosis non-STEMI was:     Marland Kitchen Present at the time of admission . NOT present at the time of inpatient admission and it developed during the inpatient stay . Unable to clinically determine whether the condition was present on admission. . Documentation insufficient to determine if condition was present at the time of inpatient admission  Thank You, Ree Kida ,RN Clinical Documentation Specialist:  [place] [facility]

## 2014-11-15 NOTE — Discharge Summary (Addendum)
Discharge Summary  [name] EXB:[id number] DOB: Apr 07, 1931  PCP: Mauricio Po, FNP  Admit date: 11/12/2014 Discharge date: 11/15/2014  Time spent: >41mins  Recommendations for Outpatient Follow-up:  1. Patient is to be discharged home with home hospice, home oxygen prn for sob  Discharge Diagnoses:  Active Hospital Problems   Diagnosis Date Noted  . Renal failure (ARF), acute on chronic 11/12/2014  . Chronic diastolic heart failure 07/24/2535  . Diabetes mellitus 11/12/2014  . Iron deficiency anemia 11/12/2014  . Hypertension 11/12/2014  . ARF (acute renal failure) 11/12/2014    Resolved Hospital Problems   Diagnosis Date Noted Date Resolved  No resolved problems to display.    Discharge Condition: stable  Diet recommendation: pleasure feeds with soft or puree diet if needed, oral care,   Filed Weights   11/12/14 2137  Weight: 85.276 kg (188 lb)    History of present illness:  [name] is a 81 y.o. male  Past mental history of CAD, chronic diastolic heart failure, diabetes mellitus and stage V chronic kidney disease. In the past, patient has not been interested in dialysis. He is brought in by his son who cares for him complaining of shortness of breath. The emergency room, he was found be hypothermic and hypotensive as well as bradycardic. Labs were done noting a potassium of 5.7, BUN of 122 and creatinine of 17 and a troponin of 7.4.. Patient was still awake and alert and appropriate. He confirms to the ER doctor and to myself that he did not want dialysis. Bladder scan done noting no signs of obstruction to this was consistent with end-stage renal disease. This was discussed with the patient and with his son and the plan will be for making the patient comfortable. Hospitalists were called for admission.  Hospital Course:  Principal Problem:   Renal failure (ARF), acute on chronic Active Problems:   Chronic diastolic heart failure   Diabetes  mellitus   Iron deficiency anemia   Hypertension   ARF (acute renal failure)  Hypoxic respiratory failure due to pulmonary edema/hyperkalemia/ESRD declined dialysis, DNR, on comfort measure, patient to be discharged to home hospice, home oxygen prn 2l for sob, sublingual morphine and ativan prn for pain/anxiety. suppository for constipation Appreciate palliative team input.   Constipation: patient c/o constipation, dulcolax suppository and mineral oil enema ordered, however, patient refused at this time. Dulcolax suppository ordered at discharge.  Consultants:  Critical care team signed off  palliative  Procedures:  none  Antibiotics:  none   Discharge Exam: BP 132/62 mmHg  Pulse 81  Temp(Src) 97.8 F (36.6 C) (Oral)  Resp 18  Ht 5\' 11"  (1.803 m)  Wt 85.276 kg (188 lb)  BMI 26.23 kg/m2  SpO2 96%   General: Fatigued, confused, but calm, does not seem in acute distress  Eyes: Sclera nonicteric  ENT: Normocephalic, atraumatic, mucous vitamins are dry  Neck: No JVD  Cardiovascular: Regular rate and rhythm, U4-Q0, 3/6 systolic ejection murmur  Respiratory: Scattered rhonchi  Abdomen: Soft, nontender, nondistended, positive bowel sounds  Skin: No skin breaks, tears or lesions  Musculoskeletal: No clubbing or cyanosis, pitting edema  Psychiatric: Patient is appropriate, no evidence of psychoses  Neurologic: confused, somnolent, No apparent deficits    Discharge Instructions You were cared for by a hospitalist during your hospital stay. If you have any questions about your discharge medications or the care you received while you were in the hospital after you are discharged, you can call the unit and  asked to speak with the hospitalist on call if the hospitalist that took care of you is not available. Once you are discharged, your primary care physician will handle any further medical issues. Please note that NO REFILLS for any discharge medications will be  authorized once you are discharged, as it is imperative that you return to your primary care physician (or establish a relationship with a primary care physician if you do not have one) for your aftercare needs so that they can reassess your need for medications and monitor your lab values.      Discharge Instructions    Diet - low sodium heart healthy    Complete by:  As directed      For home use only DME oxygen    Complete by:  As directed   Prn for sob  Mode or (Route):  Nasal cannula  Liters per Minute:  2  Oxygen conserving device:  Yes  Oxygen delivery system:  Gas     Increase activity slowly    Complete by:  As directed             Medication List    STOP taking these medications        amLODipine 10 MG tablet  Commonly known as:  NORVASC     aspirin 81 MG chewable tablet     atorvastatin 20 MG tablet  Commonly known as:  LIPITOR     carvedilol 6.25 MG tablet  Commonly known as:  COREG     ferrous sulfate 325 (65 FE) MG tablet     furosemide 80 MG tablet  Commonly known as:  LASIX     linagliptin 5 MG Tabs tablet  Commonly known as:  TRADJENTA     megestrol 40 MG/ML suspension  Commonly known as:  MEGACE     pantoprazole 40 MG tablet  Commonly known as:  PROTONIX     sulfamethoxazole-trimethoprim 800-160 MG per tablet  Commonly known as:  BACTRIM DS,SEPTRA DS     vitamin C 500 MG tablet  Commonly known as:  ASCORBIC ACID     zinc sulfate 220 MG capsule      TAKE these medications        bisacodyl 10 MG suppository  Commonly known as:  DULCOLAX  Place 1 suppository (10 mg total) rectally as needed for moderate constipation.     bisacodyl 10 MG suppository  Commonly known as:  DULCOLAX  Place 1 suppository (10 mg total) rectally once.     collagenase ointment  Commonly known as:  SANTYL  Apply topically daily.     LORazepam 2 MG/ML concentrated solution  Commonly known as:  ATIVAN  Place 0.5 mLs (1 mg total) under the tongue every 4  (four) hours as needed for anxiety or sedation. Route, sublingual, patient on comfort measure only     morphine 20 MG/ML concentrated solution  Commonly known as:  ROXANOL  Take 0.25 mLs (5 mg total) by mouth every 3 (three) hours as needed for severe pain.       No Known Allergies    The results of significant diagnostics from this hospitalization (including imaging, microbiology, ancillary and laboratory) are listed below for reference.    Significant Diagnostic Studies: Dg Chest Portable 1 View  11/12/2014   CLINICAL DATA:  Shortness of breath.  EXAM: PORTABLE CHEST - 1 VIEW  COMPARISON:  10/09/2014  FINDINGS: There is evidence of diffuse pulmonary edema. Cardiac enlargement appears stable. There likely are  bilateral pleural effusions.  IMPRESSION: Diffuse pulmonary edema, cardiac enlargement and bilateral pleural effusions.   Electronically Signed   By: Aletta Edouard M.D.   On: 11/12/2014 16:16    Microbiology: Recent Results (from the past 240 hour(s))  Culture, blood (routine x 2)     Status: None (Preliminary result)   Collection Time: 11/12/14  3:45 PM  Result Value Ref Range Status   Specimen Description BLOOD RIGHT FOREARM  Final   Special Requests BOTTLES DRAWN AEROBIC AND ANAEROBIC 5CC  Final   Culture   Final           BLOOD CULTURE RECEIVED NO GROWTH TO DATE CULTURE WILL BE HELD FOR 5 DAYS BEFORE ISSUING A FINAL NEGATIVE REPORT Performed at [facility]    Report Status PENDING  Incomplete  Culture, blood (routine x 2)     Status: None (Preliminary result)   Collection Time: 11/12/14  4:00 PM  Result Value Ref Range Status   Specimen Description BLOOD HAND RIGHT  Final   Special Requests BOTTLES DRAWN AEROBIC AND ANAEROBIC 5CC  Final   Culture   Final           BLOOD CULTURE RECEIVED NO GROWTH TO DATE CULTURE WILL BE HELD FOR 5 DAYS BEFORE ISSUING A FINAL NEGATIVE REPORT Performed at [facility]    Report Status PENDING  Incomplete      Labs: Basic Metabolic Panel:  Recent Labs Lab 11/12/14 1544 11/12/14 1612  NA 134* 134*  K 5.7* 5.7*  CL 107 111  CO2 12*  --   GLUCOSE 116* 109*  BUN 122* 101*  CREATININE 17.23* >18.00*  CALCIUM 9.3  --   MG 2.9*  --   PHOS 7.7*  --    Liver Function Tests:  Recent Labs Lab 11/12/14 1544  AST 45*  ALT 11  ALKPHOS 75  BILITOT 0.6  PROT 6.2  ALBUMIN 3.0*   No results for input(s): LIPASE, AMYLASE in the last 168 hours. No results for input(s): AMMONIA in the last 168 hours. CBC:  Recent Labs Lab 11/12/14 1544 11/12/14 1612  WBC 3.7*  --   NEUTROABS 2.8  --   HGB 8.6* 9.9*  HCT 25.8* 29.0*  MCV 83.5  --   PLT 87*  --    Cardiac Enzymes: No results for input(s): CKTOTAL, CKMB, CKMBINDEX, TROPONINI in the last 168 hours. BNP: BNP (last 3 results)  Recent Labs  10/05/14 1144 10/09/14 2303 11/12/14 1547  BNP 606.7* 676.8* 3915.8*    ProBNP (last 3 results) No results for input(s): PROBNP in the last 8760 hours.  CBG:  Recent Labs Lab 11/12/14 2133  GLUCAP 93       Signed:  Natnael Biederman  [facility] 11/15/2014, 1:40 PM

## 2014-11-15 NOTE — Clinical Social Work Note (Signed)
Patient discharging home today with Hospice services. Family's preference was [facility], however no bed availability. Patient will be transported home by ambulance. Family at the bedside.  Aarushi Hemric Givens, MSW, LCSW Licensed Clinical Social Worker [place] [phone]

## 2014-11-15 NOTE — Progress Notes (Signed)
CARE MANAGEMENT NOTE 11/15/2014  Patient:  [name], [name]   Account Number:  [id number]  Date Initiated:  11/15/2014  Documentation initiated by:  [facility]  Subjective/Objective Assessment:   Renal failure (ARF), acute on chronic     Action/Plan:   [name] -son # [phone]   Anticipated DC Date:  11/15/2014   Anticipated DC Plan:  [place] referral  Clinical Social Worker      DC Planning Services  CM consult      PAC Choice  HOSPICE   Choice offered to / List presented to:  C-4 Adult Children        [place] arranged  HH-1 RN      [place] agency  [employer] [facility] [place]   Status of service:  Completed, signed off Medicare Important Message given?  YES (If response is "NO", the following Medicare IM given date fields will be blank) Date Medicare IM given:  11/15/2014 Medicare IM given by:  [facility] Date Additional Medicare IM given:   Additional Medicare IM given by:    Discharge Disposition:  [place]  Per UR Regulation:    If discussed at Long Length of Stay Meetings, dates discussed:    Comments:  11/15/2014 1100 NCM spoke to son, [name] and offered choice for Hospice. Son requested [name]. States they have RW, hospital bed, and bedside commode at home. Notified [facility] for new referral for Hospice. Spoke to Hospice liaison and they will arrange admission to Hospice once dc home this evening. Hospice with arrange for any additional DME needed in home ie oxygen and hoyer lift.  CSW referral for [facility]. Jonnie Finner RN CCM Case Mgmt phone [phone]

## 2014-11-18 LAB — CULTURE, BLOOD (ROUTINE X 2)
CULTURE: NO GROWTH
Culture: NO GROWTH

## 2014-11-26 DEATH — deceased

## 2014-11-27 ENCOUNTER — Ambulatory Visit: Payer: Self-pay | Admitting: Cardiology

## 2014-11-27 DIAGNOSIS — I4892 Unspecified atrial flutter: Secondary | ICD-10-CM

## 2015-01-16 ENCOUNTER — Ambulatory Visit: Payer: Medicare HMO | Admitting: Family

## 2015-02-25 ENCOUNTER — Ambulatory Visit: Payer: Medicare HMO | Admitting: Cardiology
